# Patient Record
Sex: Female | Born: 1935 | Race: White | Hispanic: No | Marital: Married | Smoking: Former smoker
Health system: Southern US, Community
[De-identification: ages and names within clinical notes are randomized; demographics above are authoritative.]

## PROBLEM LIST (undated history)

## (undated) DIAGNOSIS — L570 Actinic keratosis: Secondary | ICD-10-CM

## (undated) DIAGNOSIS — N183 Chronic kidney disease, stage 3 unspecified: Secondary | ICD-10-CM

## (undated) DIAGNOSIS — I10 Essential (primary) hypertension: Secondary | ICD-10-CM

## (undated) DIAGNOSIS — I5032 Chronic diastolic (congestive) heart failure: Secondary | ICD-10-CM

## (undated) DIAGNOSIS — I4891 Unspecified atrial fibrillation: Secondary | ICD-10-CM

## (undated) DIAGNOSIS — S32030A Wedge compression fracture of third lumbar vertebra, initial encounter for closed fracture: Secondary | ICD-10-CM

## (undated) DIAGNOSIS — M1990 Unspecified osteoarthritis, unspecified site: Secondary | ICD-10-CM

## (undated) DIAGNOSIS — R1310 Dysphagia, unspecified: Secondary | ICD-10-CM

## (undated) DIAGNOSIS — I480 Paroxysmal atrial fibrillation: Secondary | ICD-10-CM

## (undated) DIAGNOSIS — I4719 Other supraventricular tachycardia: Secondary | ICD-10-CM

## (undated) DIAGNOSIS — K449 Diaphragmatic hernia without obstruction or gangrene: Secondary | ICD-10-CM

## (undated) DIAGNOSIS — K219 Gastro-esophageal reflux disease without esophagitis: Secondary | ICD-10-CM

## (undated) DIAGNOSIS — G4700 Insomnia, unspecified: Secondary | ICD-10-CM

## (undated) DIAGNOSIS — E039 Hypothyroidism, unspecified: Secondary | ICD-10-CM

## (undated) DIAGNOSIS — M8580 Other specified disorders of bone density and structure, unspecified site: Secondary | ICD-10-CM

## (undated) DIAGNOSIS — C50919 Malignant neoplasm of unspecified site of unspecified female breast: Secondary | ICD-10-CM

## (undated) DIAGNOSIS — I471 Supraventricular tachycardia: Secondary | ICD-10-CM

## (undated) DIAGNOSIS — J90 Pleural effusion, not elsewhere classified: Secondary | ICD-10-CM

## (undated) HISTORY — DX: Other supraventricular tachycardia: I47.19

## (undated) HISTORY — DX: Other specified disorders of bone density and structure, unspecified site: M85.80

## (undated) HISTORY — PX: TONSILLECTOMY: SUR1361

## (undated) HISTORY — DX: Essential (primary) hypertension: I10

## (undated) HISTORY — DX: Pleural effusion, not elsewhere classified: J90

## (undated) HISTORY — DX: Insomnia, unspecified: G47.00

## (undated) HISTORY — PX: EYE SURGERY: SHX253

## (undated) HISTORY — DX: Paroxysmal atrial fibrillation: I48.0

## (undated) HISTORY — DX: Unspecified atrial fibrillation: I48.91

## (undated) HISTORY — DX: Hypothyroidism, unspecified: E03.9

## (undated) HISTORY — DX: Chronic kidney disease, stage 3 unspecified: N18.30

## (undated) HISTORY — DX: Malignant neoplasm of unspecified site of unspecified female breast: C50.919

## (undated) HISTORY — DX: Dysphagia, unspecified: R13.10

## (undated) HISTORY — DX: Actinic keratosis: L57.0

## (undated) HISTORY — DX: Chronic diastolic (congestive) heart failure: I50.32

## (undated) HISTORY — DX: Diaphragmatic hernia without obstruction or gangrene: K44.9

## (undated) HISTORY — PX: COLONOSCOPY: SHX174

## (undated) HISTORY — DX: Supraventricular tachycardia: I47.1

---

## 1993-06-05 HISTORY — PX: BREAST LUMPECTOMY: SHX2

## 1997-09-28 ENCOUNTER — Other Ambulatory Visit: Admission: RE | Admit: 1997-09-28 | Discharge: 1997-09-28 | Payer: Self-pay | Admitting: Oncology

## 1998-02-10 ENCOUNTER — Other Ambulatory Visit: Admission: RE | Admit: 1998-02-10 | Discharge: 1998-02-10 | Payer: Self-pay | Admitting: *Deleted

## 1999-01-25 ENCOUNTER — Other Ambulatory Visit: Admission: RE | Admit: 1999-01-25 | Discharge: 1999-01-25 | Payer: Self-pay | Admitting: *Deleted

## 1999-12-06 ENCOUNTER — Other Ambulatory Visit: Admission: RE | Admit: 1999-12-06 | Discharge: 1999-12-06 | Payer: Self-pay | Admitting: Family Medicine

## 2000-02-03 ENCOUNTER — Encounter: Payer: Self-pay | Admitting: General Surgery

## 2000-02-03 ENCOUNTER — Encounter: Admission: RE | Admit: 2000-02-03 | Discharge: 2000-02-03 | Payer: Self-pay | Admitting: General Surgery

## 2001-02-12 ENCOUNTER — Encounter: Admission: RE | Admit: 2001-02-12 | Discharge: 2001-02-12 | Payer: Self-pay | Admitting: General Surgery

## 2001-02-12 ENCOUNTER — Encounter: Payer: Self-pay | Admitting: General Surgery

## 2002-01-27 ENCOUNTER — Other Ambulatory Visit: Admission: RE | Admit: 2002-01-27 | Discharge: 2002-01-27 | Payer: Self-pay | Admitting: *Deleted

## 2002-02-18 ENCOUNTER — Encounter: Admission: RE | Admit: 2002-02-18 | Discharge: 2002-02-18 | Payer: Self-pay | Admitting: General Surgery

## 2002-02-18 ENCOUNTER — Encounter: Payer: Self-pay | Admitting: General Surgery

## 2002-04-07 ENCOUNTER — Encounter: Payer: Self-pay | Admitting: Oncology

## 2002-04-07 ENCOUNTER — Encounter: Admission: RE | Admit: 2002-04-07 | Discharge: 2002-04-07 | Payer: Self-pay | Admitting: Oncology

## 2003-02-03 ENCOUNTER — Other Ambulatory Visit: Admission: RE | Admit: 2003-02-03 | Discharge: 2003-02-03 | Payer: Self-pay | Admitting: *Deleted

## 2003-02-24 ENCOUNTER — Encounter: Admission: RE | Admit: 2003-02-24 | Discharge: 2003-02-24 | Payer: Self-pay | Admitting: General Surgery

## 2003-02-24 ENCOUNTER — Encounter: Payer: Self-pay | Admitting: General Surgery

## 2003-03-25 ENCOUNTER — Encounter: Admission: RE | Admit: 2003-03-25 | Discharge: 2003-03-25 | Payer: Self-pay | Admitting: Oncology

## 2003-03-25 ENCOUNTER — Encounter: Payer: Self-pay | Admitting: Oncology

## 2004-02-25 ENCOUNTER — Encounter: Admission: RE | Admit: 2004-02-25 | Discharge: 2004-02-25 | Payer: Self-pay | Admitting: General Surgery

## 2004-04-18 ENCOUNTER — Ambulatory Visit: Payer: Self-pay | Admitting: Oncology

## 2004-07-05 ENCOUNTER — Ambulatory Visit: Payer: Self-pay | Admitting: Internal Medicine

## 2004-08-02 ENCOUNTER — Ambulatory Visit: Payer: Self-pay | Admitting: Internal Medicine

## 2005-02-27 ENCOUNTER — Encounter: Admission: RE | Admit: 2005-02-27 | Discharge: 2005-02-27 | Payer: Self-pay | Admitting: General Surgery

## 2006-02-28 ENCOUNTER — Encounter: Admission: RE | Admit: 2006-02-28 | Discharge: 2006-02-28 | Payer: Self-pay | Admitting: General Surgery

## 2007-03-20 ENCOUNTER — Encounter: Admission: RE | Admit: 2007-03-20 | Discharge: 2007-03-20 | Payer: Self-pay | Admitting: Obstetrics and Gynecology

## 2008-03-26 ENCOUNTER — Encounter: Admission: RE | Admit: 2008-03-26 | Discharge: 2008-03-26 | Payer: Self-pay | Admitting: Family Medicine

## 2008-12-09 IMAGING — CR DG CHEST 2V
2 series · 2 of 2 positions shown · non-contrast
Comparison: None.

CLINICAL DATA: Preop for surgery for Dupuytrens contracture

CHEST - 2 VIEW

[view not recorded (1 of 2)]
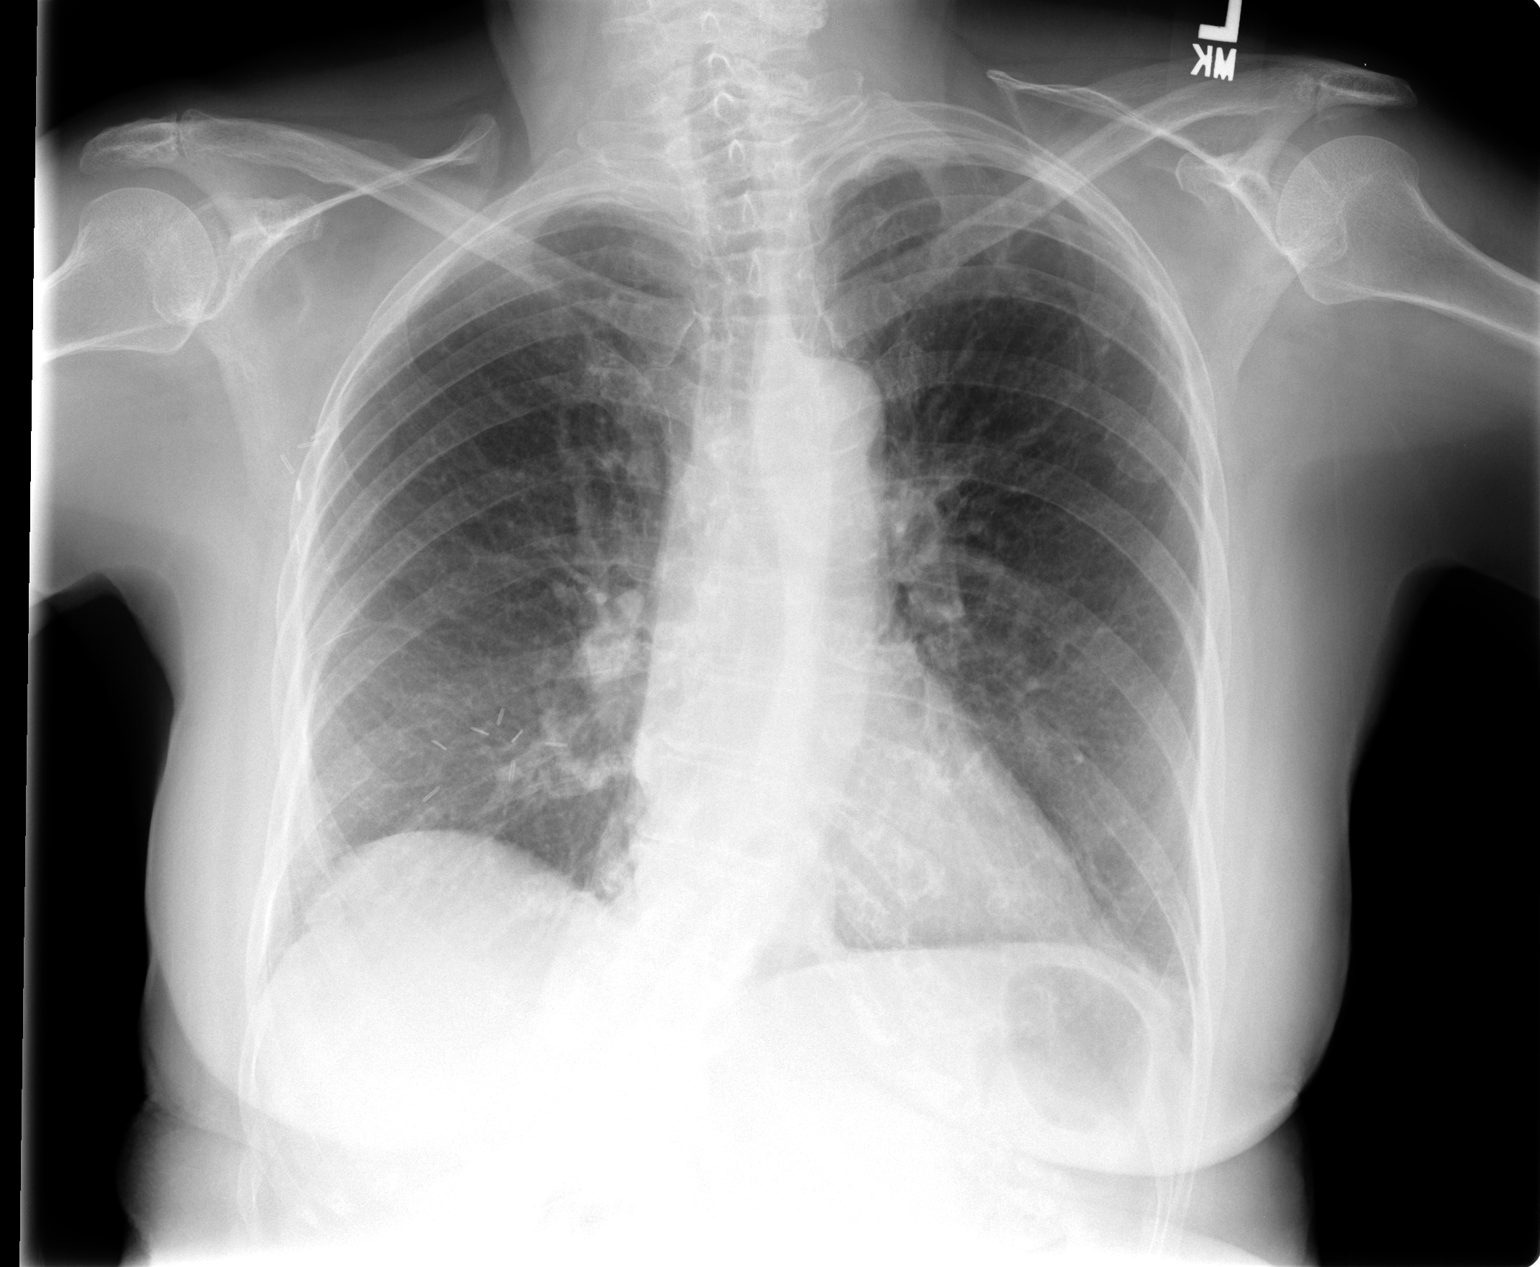

[view not recorded (2 of 2)]
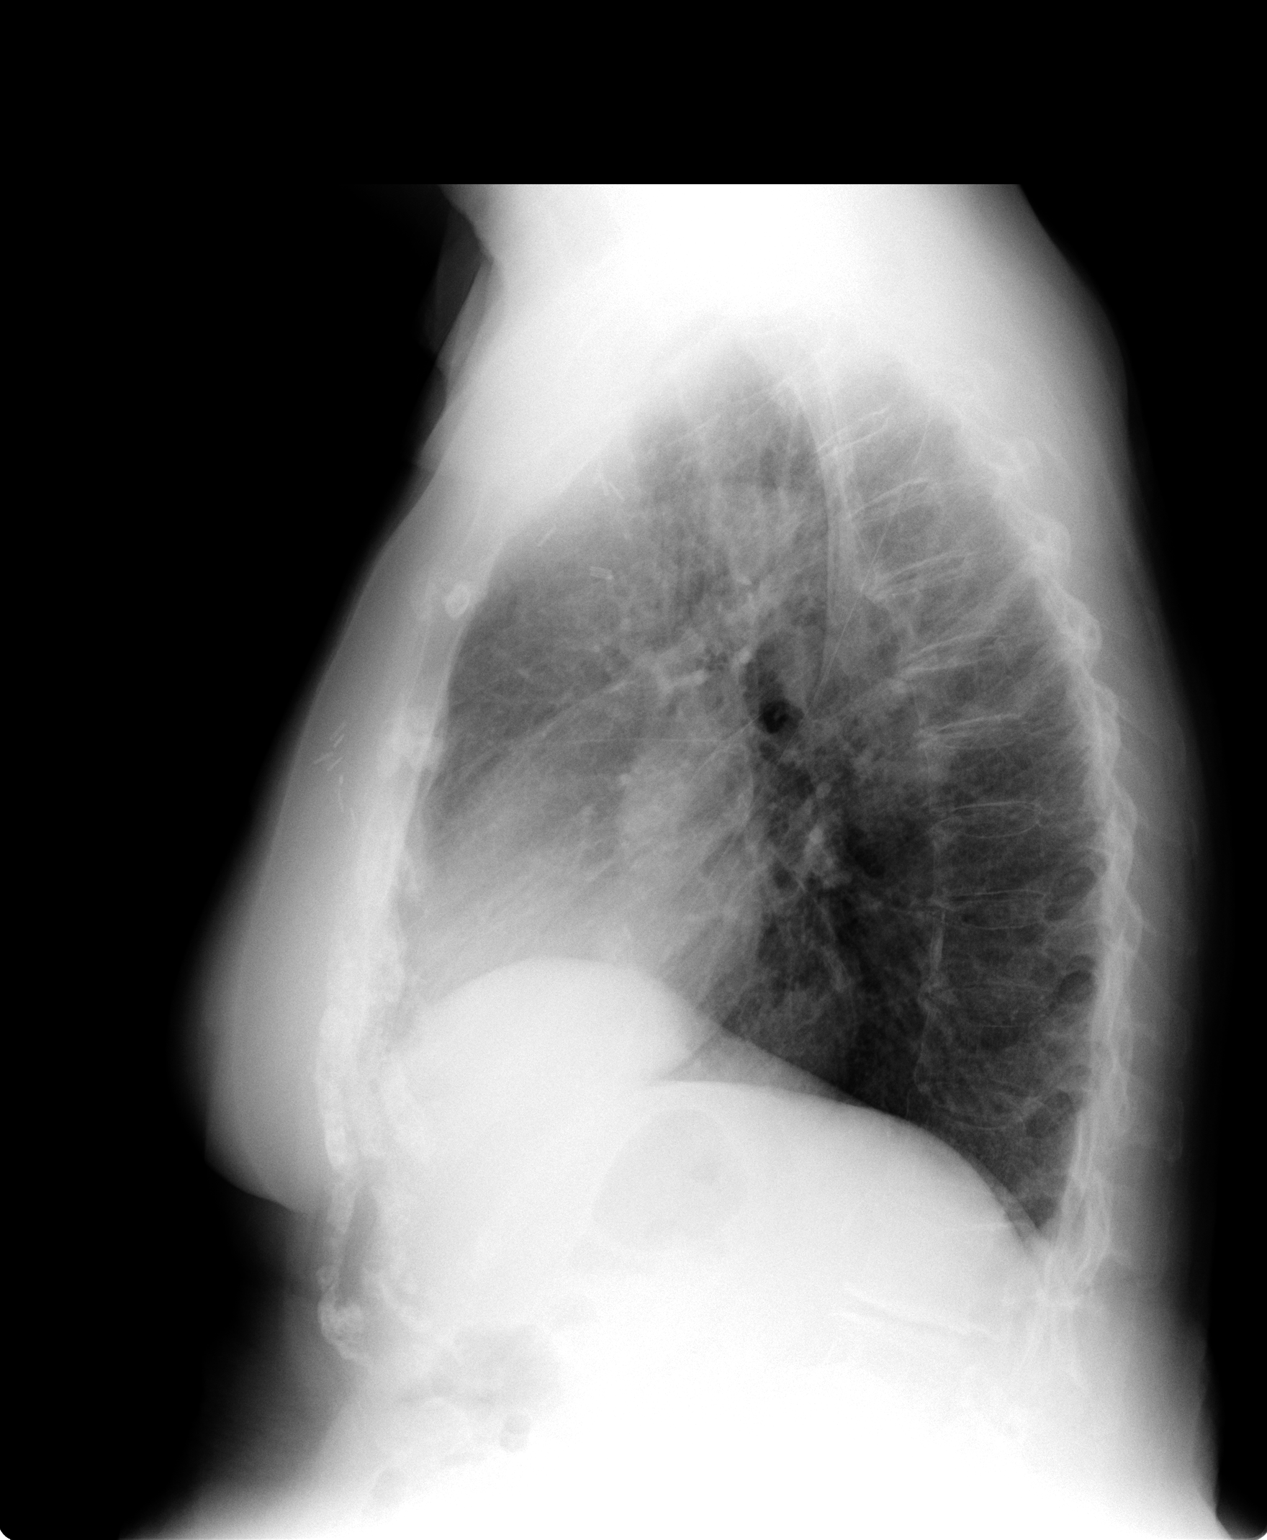

[2 of 2 positions shown; findings below may reference images not displayed]

FINDINGS: The lungs are clear.  No mediastinal or hilar adenopathy
is seen.  The heart is mildly enlarged.  Thoracolumbar scoliosis is
noted.  Surgical clips overlie the right breast and right axilla.
IMPRESSION: No active lung disease.

## 2009-03-29 ENCOUNTER — Encounter: Admission: RE | Admit: 2009-03-29 | Discharge: 2009-03-29 | Payer: Self-pay | Admitting: Family Medicine

## 2009-08-08 ENCOUNTER — Emergency Department (HOSPITAL_COMMUNITY): Admission: EM | Admit: 2009-08-08 | Discharge: 2009-08-09 | Payer: Self-pay | Admitting: Emergency Medicine

## 2009-08-08 IMAGING — CT CT HEAD W/O CM
1 series · 16 of 30 positions shown, 20 images · non-contrast
Comparison: None.

CLINICAL DATA: Headache, nausea, and vomiting.  Hypertension.

CT HEAD WITHOUT CONTRAST
TECHNIQUE: Contiguous axial images were obtained from the base of
the skull through the vertex without contrast.

[Series 2: headseq 4.8 h45s · axial · 0.43mm/px · z∈[-58,+70]mm · 16 of 30 slices shown, 20 images]
[im 2/30  brain]
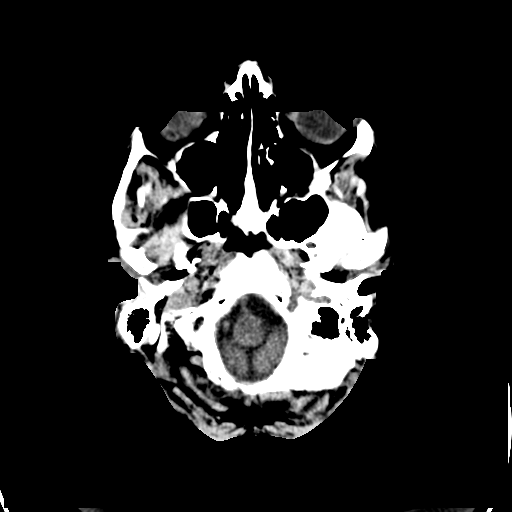
[im 2/30  bone]
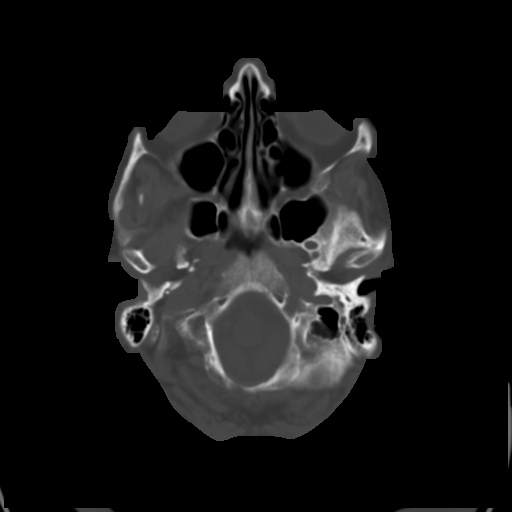
[im 4/30  brain]
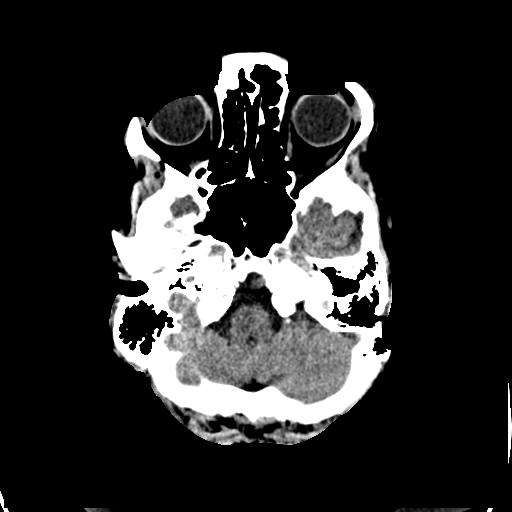
[im 6/30  brain]
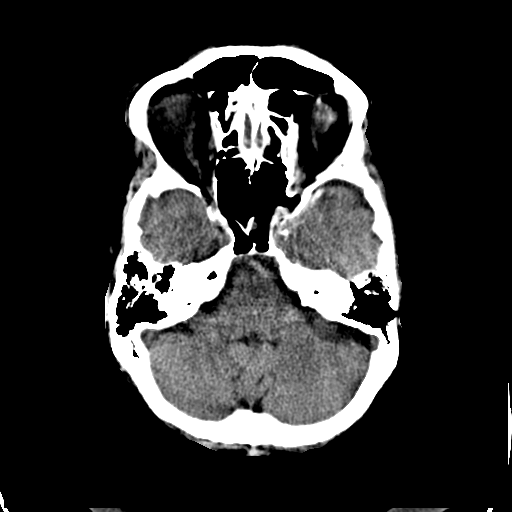
[im 8/30  brain]
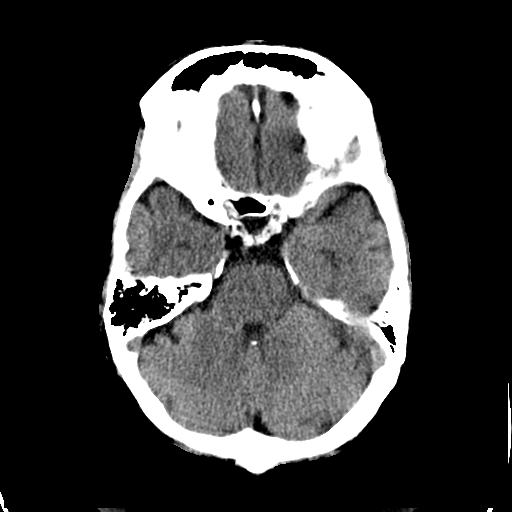
[im 9/30  brain]
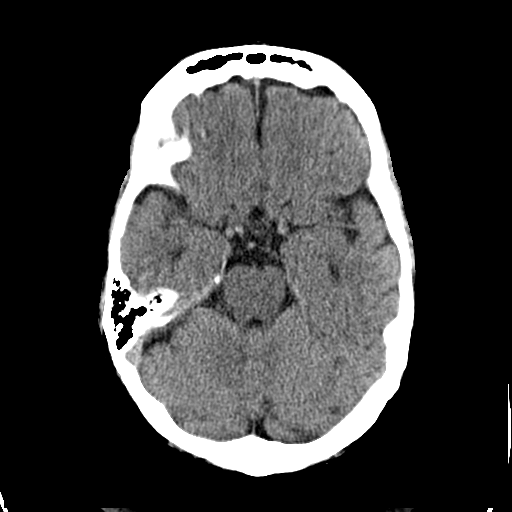
[im 9/30  bone]
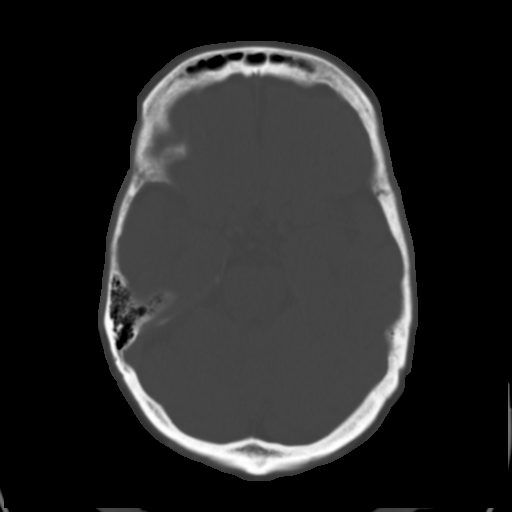
[im 11/30  brain]
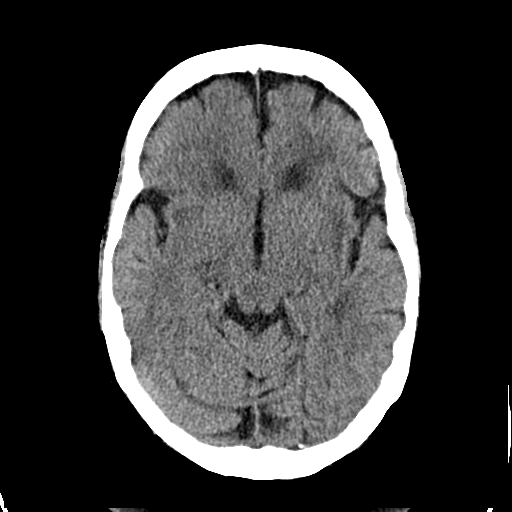
[im 13/30  brain]
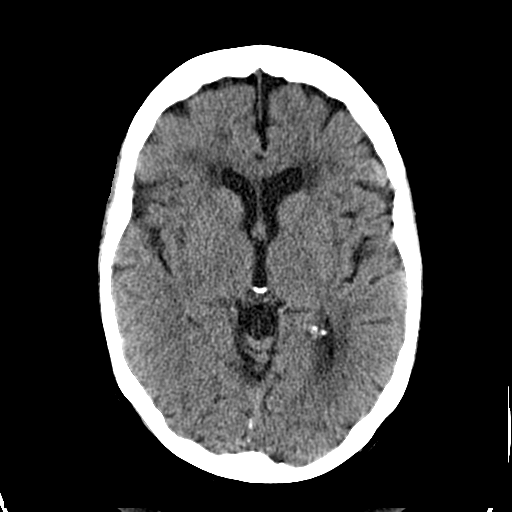
[im 15/30  brain]
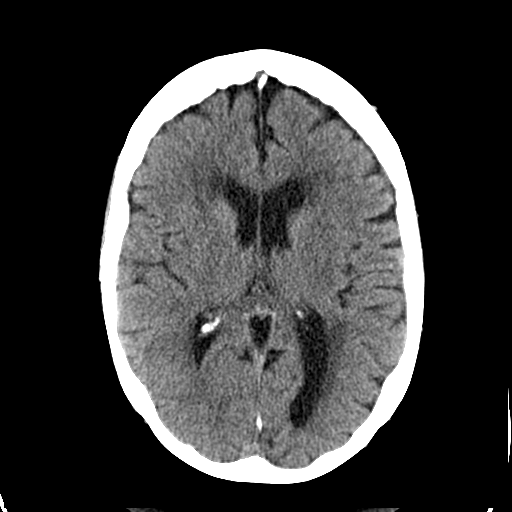
[im 16/30  brain]
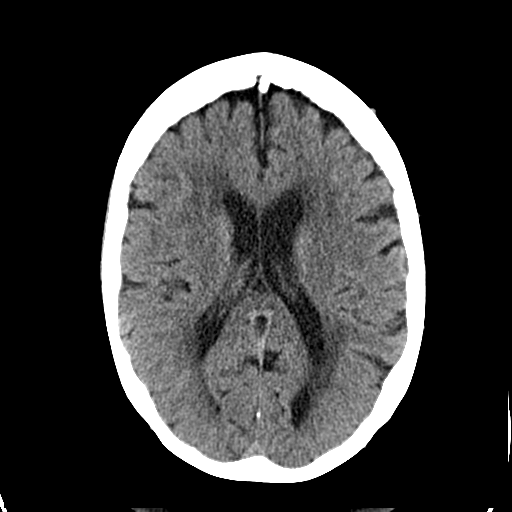
[im 16/30  bone]
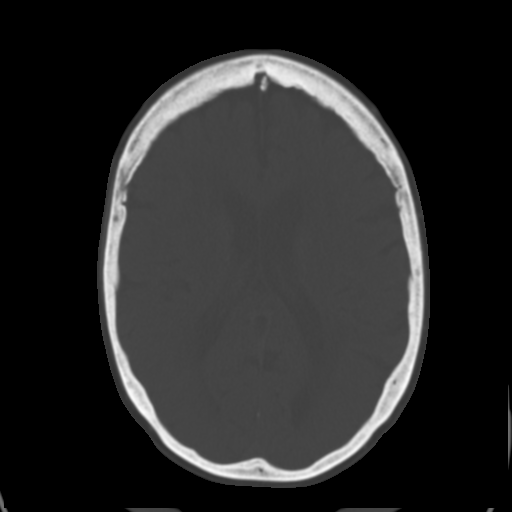
[im 18/30  brain]
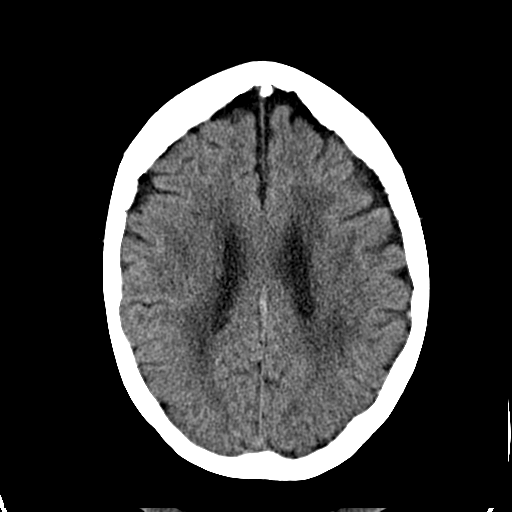
[im 20/30  brain]
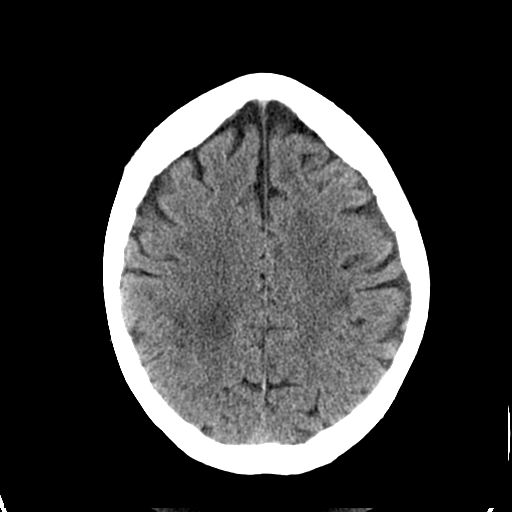
[im 22/30  brain]
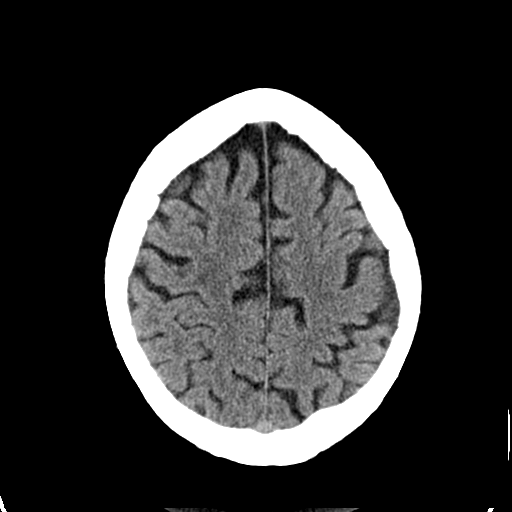
[im 23/30  brain]
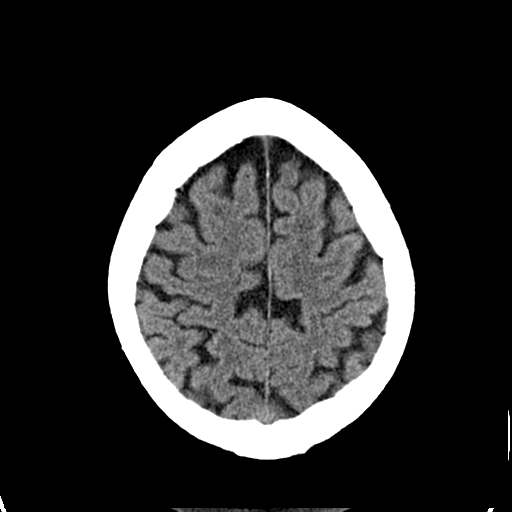
[im 23/30  bone]
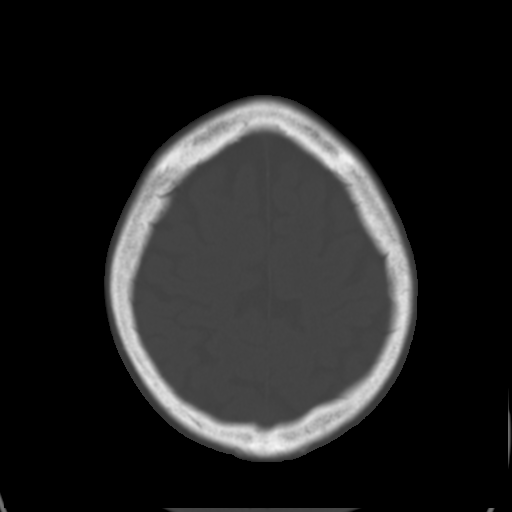
[im 25/30  brain]
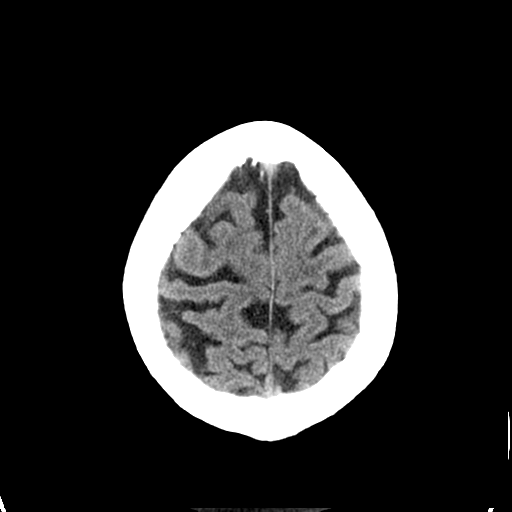
[im 27/30  brain]
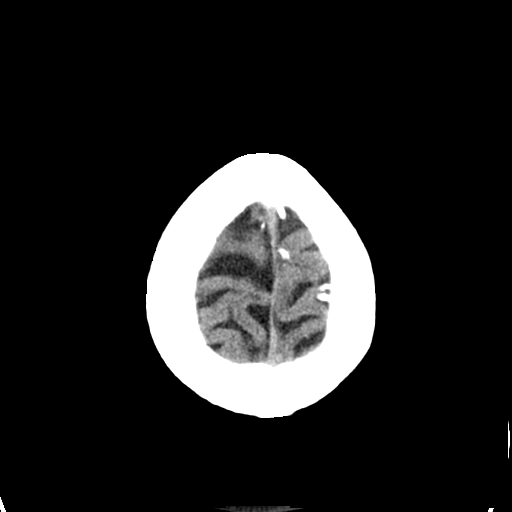
[im 29/30  brain]
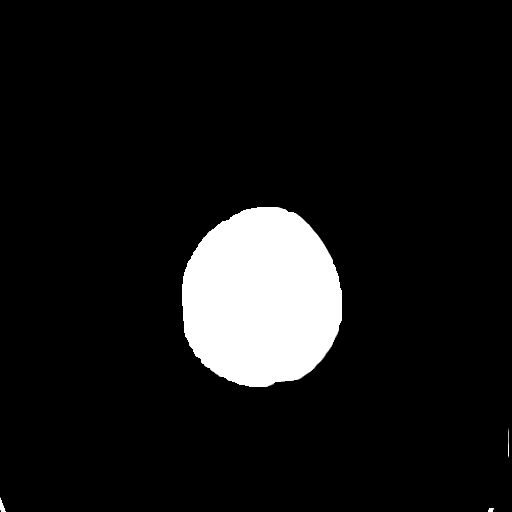

[16 of 30 positions shown; findings below may reference images not displayed]

FINDINGS: There is no acute intracranial hemorrhage or mass lesion.
There are extensive periventricular white matter lucencies
consistent with small vessel ischemic disease, chronic.  No
definitive acute infarction.  There is mild cerebral cortical
atrophy with no significant ventricular dilatation.  Bony
structures are normal.
IMPRESSION: No acute intracranial abnormality.  Extensive periventricular white
matter small vessel ischemic changes.

## 2009-08-08 IMAGING — RF DG FLUORO GUIDE NDL PLC/BX
1 series · 1 of 1 positions shown · non-contrast
Comparison: none

CLINICAL DATA: Headache.  Nausea.  Subarachnoid hemorrhage.

DIAGNOSTIC LUMBAR PUNCTURE UNDER FLUOROSCOPIC GUIDANCE
Fluoroscopy time:  0.6 minutes. Pulsed fluoroscopy was used.
TECHNIQUE: Informed consent was obtained from the patient prior to
the procedure, including potential complications of headache,
allergy, and pain.   With the patient prone, the lower back was
prepped with Betadine.  1% Lidocaine was used for local anesthesia.
Lumbar puncture was performed at the L3-L4 level using a 20 gauge
needle with return of clear CSF with an opening pressure of 16 cm
water.   8 ml of CSF were obtained for laboratory studies.  The
patient tolerated the procedure well and there were no apparent
complications.

[Series 1: run · 1 of 1 slices shown]
[im 1/1]
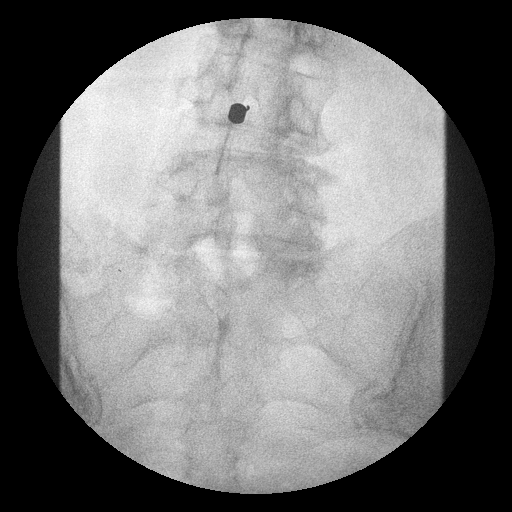

[1 of 1 positions shown; findings below may reference images not displayed]

IMPRESSION: Successful fluoroscopically guided lumbar puncture.

## 2009-12-16 ENCOUNTER — Encounter: Admission: RE | Admit: 2009-12-16 | Discharge: 2009-12-16 | Payer: Self-pay | Admitting: Orthopedic Surgery

## 2009-12-21 ENCOUNTER — Ambulatory Visit (HOSPITAL_BASED_OUTPATIENT_CLINIC_OR_DEPARTMENT_OTHER): Admission: RE | Admit: 2009-12-21 | Discharge: 2009-12-21 | Payer: Self-pay | Admitting: Orthopedic Surgery

## 2010-03-10 ENCOUNTER — Encounter
Admission: RE | Admit: 2010-03-10 | Discharge: 2010-05-06 | Payer: Self-pay | Source: Home / Self Care | Admitting: Sports Medicine

## 2010-04-19 ENCOUNTER — Encounter: Admission: RE | Admit: 2010-04-19 | Discharge: 2010-04-19 | Payer: Self-pay | Admitting: Sports Medicine

## 2010-06-25 ENCOUNTER — Encounter: Payer: Self-pay | Admitting: Family Medicine

## 2010-08-21 LAB — BASIC METABOLIC PANEL
BUN: 18 mg/dL (ref 6–23)
Calcium: 9.4 mg/dL (ref 8.4–10.5)
GFR calc non Af Amer: >60 mL/min (ref >60)

## 2010-08-29 LAB — COMPREHENSIVE METABOLIC PANEL
AST: 23 U/L (ref 0–37)
Albumin: 3.5 g/dL (ref 3.5–5.2)
Alkaline Phosphatase: 55 U/L (ref 39–117)
GFR calc Af Amer: >60 mL/min (ref >60)
GFR calc non Af Amer: >60 mL/min (ref >60)
Potassium: 3.9 mEq/L (ref 3.5–5.1)
Total Protein: 6.4 g/dL (ref 6.0–8.3)

## 2010-08-29 LAB — DIFFERENTIAL
Basophils Absolute: 0.3 10*3/uL — ABNORMAL HIGH (ref 0.0–0.1)
Basophils Relative: 3 % — ABNORMAL HIGH (ref 0–1)
Lymphocytes Relative: 17 % (ref 12–46)
Monocytes Absolute: 0.5 10*3/uL (ref 0.1–1.0)
Monocytes Relative: 6 % (ref 3–12)
Neutro Abs: 7.2 10*3/uL (ref 1.7–7.7)
Neutrophils Relative %: 72 % (ref 43–77)

## 2010-08-29 LAB — CSF CELL COUNT WITH DIFFERENTIAL
RBC Count, CSF: 57 /mm3 — ABNORMAL HIGH
Tube #: 4

## 2010-08-29 LAB — CSF CULTURE W GRAM STAIN: Gram Stain: NONE SEEN

## 2010-08-29 LAB — CBC
HCT: 41.4 % (ref 36.0–46.0)
Hemoglobin: 14.4 g/dL (ref 12.0–15.0)
Platelets: 191 10*3/uL (ref 150–400)
RBC: 4.42 MIL/uL (ref 3.87–5.11)
RDW: 12.6 % (ref 11.5–15.5)
WBC: 9.9 10*3/uL (ref 4.0–10.5)

## 2010-08-29 LAB — GLUCOSE, CSF: Glucose, CSF: 61 mg/dL (ref 43–76)

## 2010-08-29 LAB — PROTEIN, CSF: Total  Protein, CSF: 38 mg/dL (ref 15–45)

## 2010-09-07 ENCOUNTER — Other Ambulatory Visit: Payer: Self-pay | Admitting: Dermatology

## 2011-03-08 ENCOUNTER — Other Ambulatory Visit: Payer: Self-pay | Admitting: Dermatology

## 2011-03-15 ENCOUNTER — Other Ambulatory Visit: Payer: Self-pay | Admitting: Obstetrics and Gynecology

## 2011-03-15 DIAGNOSIS — Z1231 Encounter for screening mammogram for malignant neoplasm of breast: Secondary | ICD-10-CM

## 2011-04-24 ENCOUNTER — Ambulatory Visit: Payer: Self-pay

## 2011-04-26 ENCOUNTER — Ambulatory Visit
Admission: RE | Admit: 2011-04-26 | Discharge: 2011-04-26 | Disposition: A | Discharge status: Home / Self Care | Payer: Medicare Other | Source: Ambulatory Visit | Attending: Obstetrics and Gynecology | Admitting: Obstetrics and Gynecology

## 2011-04-26 DIAGNOSIS — Z1231 Encounter for screening mammogram for malignant neoplasm of breast: Secondary | ICD-10-CM

## 2012-03-21 ENCOUNTER — Other Ambulatory Visit: Payer: Self-pay | Admitting: Obstetrics and Gynecology

## 2012-03-21 DIAGNOSIS — Z1231 Encounter for screening mammogram for malignant neoplasm of breast: Secondary | ICD-10-CM

## 2012-04-29 ENCOUNTER — Ambulatory Visit
Admission: RE | Admit: 2012-04-29 | Discharge: 2012-04-29 | Disposition: A | Discharge status: Home / Self Care | Payer: Self-pay | Source: Ambulatory Visit | Attending: Obstetrics and Gynecology | Admitting: Obstetrics and Gynecology

## 2012-04-29 DIAGNOSIS — Z1231 Encounter for screening mammogram for malignant neoplasm of breast: Secondary | ICD-10-CM

## 2013-04-04 ENCOUNTER — Other Ambulatory Visit: Payer: Self-pay

## 2013-04-04 DIAGNOSIS — Z1231 Encounter for screening mammogram for malignant neoplasm of breast: Secondary | ICD-10-CM

## 2013-04-10 ENCOUNTER — Ambulatory Visit: Payer: Self-pay | Admitting: Cardiology

## 2013-04-24 ENCOUNTER — Ambulatory Visit: Payer: Self-pay | Admitting: Cardiology

## 2013-05-05 ENCOUNTER — Ambulatory Visit
Admission: RE | Admit: 2013-05-05 | Discharge: 2013-05-05 | Disposition: A | Discharge status: Home / Self Care | Payer: Medicare Other | Source: Ambulatory Visit

## 2013-05-05 DIAGNOSIS — Z1231 Encounter for screening mammogram for malignant neoplasm of breast: Secondary | ICD-10-CM

## 2013-05-07 ENCOUNTER — Telehealth: Payer: Self-pay | Admitting: Cardiology

## 2013-05-07 NOTE — Telephone Encounter (Signed)
Follow Up   Pt was recently in the hospital in [facility]. Pt has an appointment 12/5// Pt's Husband states that there were a lot of test completed and asks if the records are needed for the up coming appointment//Husband states her PCP has received these notes.. Please call back to advise if notes are already on file.

## 2013-05-08 ENCOUNTER — Encounter: Payer: Self-pay | Admitting: Cardiology

## 2013-05-08 DIAGNOSIS — I10 Essential (primary) hypertension: Secondary | ICD-10-CM | POA: Insufficient documentation

## 2013-05-08 DIAGNOSIS — E039 Hypothyroidism, unspecified: Secondary | ICD-10-CM | POA: Insufficient documentation

## 2013-05-09 ENCOUNTER — Ambulatory Visit (INDEPENDENT_AMBULATORY_CARE_PROVIDER_SITE_OTHER): Payer: Medicare Other | Admitting: Cardiology

## 2013-05-09 VITALS — BP 151/85 | HR 71 | Ht 64.5 in | Wt 162.8 lb

## 2013-05-09 DIAGNOSIS — M549 Dorsalgia, unspecified: Secondary | ICD-10-CM

## 2013-05-09 DIAGNOSIS — I10 Essential (primary) hypertension: Secondary | ICD-10-CM

## 2013-05-09 DIAGNOSIS — I4891 Unspecified atrial fibrillation: Secondary | ICD-10-CM | POA: Insufficient documentation

## 2013-05-09 NOTE — Patient Instructions (Addendum)
Your physician has recommended that you wear a 30 day event monitor. Event monitors are medical devices that record the heart's electrical activity. Doctors most often [place] these monitors to diagnose arrhythmias. Arrhythmias are problems with the speed or rhythm of the heartbeat. The monitor is a small, portable device. You can wear one while you do your normal daily activities. This is usually used to diagnose what is causing palpitations/syncope (passing out).  Your physician recommends that you schedule a follow-up appointment in: 3 months with Dr.Skains  Your physician recommends that you continue on your current medications as directed. Please refer to the Current Medication list given to you today.

## 2013-05-09 NOTE — Progress Notes (Signed)
[facility]. [address], [address], [place]  [address] Phone: [phone] Fax:  [phone]  Date:  05/09/2013   ID:  Cassandra Ray, DOB 1922/12/17, MRN [id number]  PCP:   Melinda Crutch, MD   History of Present Illness: Cassandra Ray is a 79 y.o. female with dyspnea, grade 3 diastolic dysfunction, normal ejection fraction, hypertension here for followup. In November of 2014 was hospitalized in [place]. Blood pressure medication was increased. Hyponatremia has been chronic. She underwent a stress test which showed no evidence of ischemia. Low risk. Normal EF. Echocardiogram showed EF of 55-60% with mild LVH, mild pulmonary hypertension, trace TR. I reviewed her discharge summary, she was visiting [place] and felt weak, vomiting, nausea. This began after eating seafood. She was diagnosed with A. fib and RVR. IV Lopressor. As well as fluids. She converted to normal rhythm. Abdominal ultrasound showed an incidental finding of pancreatic cyst with recommendations to repeat in 6-12 months. Her Toprol regimen was continued however lisinopril 10 mg was substituted for Norvasc. I have reviewed emergency room stress and it is challenging for me to state clearly that she had atrial fibrillation. There do appear to be P waves present. Difficult to determine.  Wt Readings from Last 3 Encounters:  05/09/13 162 lb 12.8 oz (73.846 kg)     Past Medical History  Diagnosis Date  . HTN (hypertension)   . Hypothyroidism   . Insomnia   . Actinic keratoses     Past Surgical History  Procedure Laterality Date  . Breast lumpectomy      Current Outpatient Prescriptions  Medication Sig Dispense Refill  . albuterol (PROVENTIL HFA;VENTOLIN HFA) 108 (90 BASE) MCG/ACT inhaler Inhale 2 puffs into the lungs every 6 (six) hours as needed for wheezing or shortness of breath.      . Alum & Mag Hydroxide-Simeth (MAG-AL PLUS PO) Take by mouth.      . calcium citrate-vitamin D (CITRACAL+D)  315-200 MG-UNIT per tablet Take 1 tablet by mouth daily.      . cholecalciferol (VITAMIN D) 400 UNITS TABS tablet Take 400 Units by mouth daily.      . famotidine-calcium carbonate-magnesium hydroxide (PEPCID COMPLETE) 10-800-165 MG CHEW chewable tablet Chew 1 tablet by mouth daily as needed.      Marland Kitchen levothyroxine (SYNTHROID, LEVOTHROID) 125 MCG tablet Take 125 mcg by mouth daily before breakfast.      . lisinopril (PRINIVIL,ZESTRIL) 10 MG tablet 10 mg daily.      . metoprolol (LOPRESSOR) 100 MG tablet Take 100 mg by mouth 2 (two) times daily.      . mirabegron ER (MYRBETRIQ) 50 MG TB24 tablet Take 50 mg by mouth daily.      . Multiple Vitamin (MULTIVITAMIN) tablet Take 1 tablet by mouth daily.      Marland Kitchen zolpidem (AMBIEN) 10 MG tablet Take 10 mg by mouth at bedtime as needed for sleep.      Marland Kitchen aspirin 81 MG tablet Take 81 mg by mouth daily.       No current facility-administered medications for this visit.    Allergies:   No Known Allergies  Social History:  The patient     ROS:  Please see the history of present illness.   No syncope, no fevers. Positive back pain. No chest pain.    PHYSICAL EXAM: VS:  BP 151/85  Pulse 71  Ht 5' 4.5" (1.638 m)  Wt 162 lb 12.8 oz (73.846 kg)  BMI 27.52 kg/m2 Well nourished, well developed, in no acute distress HEENT: normal Neck: no JVD Cardiac:  normal S1, S2; RRR; no murmur Lungs:  clear to auscultation bilaterally, no wheezing, rhonchi or rales Abd: soft, nontender, no hepatomegaly Ext: no edema Skin: warm and dry Neuro: no focal abnormalities noted  EKG:  Prior EKGs from hospitalization reviewed. P waves are present however challenging to make a definitive diagnosis of atrial fibrillation. Intervals appear quite normal. Could be PAT?   Echo: 4/14: 1. There is moderate asymmetric left ventricle hypertrophy. 2. Left ventricular ejection fraction estimated by 2D at 55-60 percent. 3. There were no regional wall motion abnormalities. 4. There is  mild tricuspid regurgitation. 5. Mildly elevated estimated right ventricular systolic pressure. 6. Mild calcification of the aortic valve. 7. Analysis of mitral valve inflow, pulmonary vein Doppler and tissue Doppler suggests grade III diastolic dysfunction with elevated left atrial pressures.   ASSESSMENT AND PLAN:  1. Possible atrial fibrillation paroxysmal-I would like for her to wear an event monitor for one month to see if we detect any evidence of a chew fibrillation. She states that she did not realize that she was in atrial fibrillation when she went to the emergency room. They did not start her on anticoagulation in the hospital which may lead me to believe that there could of been question in the diagnosis of atrial fibrillation as well or perhaps it was not started because of short episode. Stress test, echocardiogram reassuring. I had lengthy discussion with her on the risks of atrial fibrillation and stroke. She is aware. 2. Hypertension-mildly elevated today. Continue with current blood pressure changes. Agree with metoprolol as well as ACE inhibitor given her current condition. 3. Back pain-she is currently holding aspirin. She is going to have spinal injections soon. Another reason for not starting anticoagulation at this point. 4. We will see back in 3 months.  Signed, Candee Furbish, MD [facility]  05/09/2013 4:06 PM

## 2013-05-10 ENCOUNTER — Encounter: Payer: Self-pay | Admitting: Cardiology

## 2013-05-12 NOTE — Telephone Encounter (Signed)
Patient seen on 05/09/2013. Closing note.Marland KitchenMarland Kitchen

## 2013-05-15 ENCOUNTER — Encounter (INDEPENDENT_AMBULATORY_CARE_PROVIDER_SITE_OTHER): Payer: Medicare Other

## 2013-05-15 ENCOUNTER — Encounter: Payer: Self-pay | Admitting: *Deleted

## 2013-05-15 DIAGNOSIS — I4891 Unspecified atrial fibrillation: Secondary | ICD-10-CM

## 2013-05-15 NOTE — Progress Notes (Signed)
Patient ID: Cassandra Ray, female   DOB: 1922-12-07, 79 y.o.   MRN: [id number] [employer] 30 day cardiac event monitor applied to patient.

## 2013-07-14 ENCOUNTER — Telehealth: Payer: Self-pay | Admitting: Cardiology

## 2013-07-14 NOTE — Telephone Encounter (Signed)
Monitor Results

## 2013-08-11 ENCOUNTER — Ambulatory Visit (INDEPENDENT_AMBULATORY_CARE_PROVIDER_SITE_OTHER): Payer: Medicare Other | Admitting: Cardiology

## 2013-08-11 ENCOUNTER — Encounter: Payer: Self-pay | Admitting: Cardiology

## 2013-08-11 VITALS — BP 120/82 | HR 64 | Ht 64.0 in | Wt 157.0 lb

## 2013-08-11 DIAGNOSIS — I10 Essential (primary) hypertension: Secondary | ICD-10-CM

## 2013-08-11 DIAGNOSIS — R609 Edema, unspecified: Secondary | ICD-10-CM

## 2013-08-11 DIAGNOSIS — M25569 Pain in unspecified knee: Secondary | ICD-10-CM

## 2013-08-11 DIAGNOSIS — R600 Localized edema: Secondary | ICD-10-CM

## 2013-08-11 DIAGNOSIS — I4891 Unspecified atrial fibrillation: Secondary | ICD-10-CM

## 2013-08-11 NOTE — Progress Notes (Signed)
[facility]. [address], [place]  [address] Phone: [phone] Fax:  [phone]  Date:  08/11/2013   ID:  Cassandra Ray, DOB 1922/12/03, MRN [id number]  PCP:   Melinda Crutch, MD   History of Present Illness: Cassandra Ray is a 80 y.o. female with dyspnea, grade 3 diastolic dysfunction, normal ejection fraction, hypertension here for followup. Right knee pain. In November of 2014 was hospitalized in [place]. Blood pressure medication was increased. Hyponatremia has been chronic. She underwent a stress test which showed no evidence of ischemia. Low risk. Normal EF. Echocardiogram showed EF of 55-60% with mild LVH, mild pulmonary hypertension, trace TR. I reviewed her discharge summary, she was visiting [place] and felt weak, vomiting, nausea. This began after eating seafood. She was diagnosed with A. fib and RVR. IV Lopressor. As well as fluids. She converted to normal rhythm. Abdominal ultrasound showed an incidental finding of pancreatic cyst with recommendations to repeat in 6-12 months. Her Toprol regimen was continued however lisinopril 10 mg was substituted for Norvasc. I have reviewed emergency room stress and it is challenging for me to state clearly that she had atrial fibrillation. There do appear to be P waves present. Difficult to determine.  She underwent event monitor through December/January 2014/2015 which showed no evidence of atrial fibrillation. Excellent. She's had no further palpitations. Main complaint is knee pain. Minor lower extremity swelling.  Wt Readings from Last 3 Encounters:  08/11/13 157 lb (71.215 kg)  05/09/13 162 lb 12.8 oz (73.846 kg)     Past Medical History  Diagnosis Date  . HTN (hypertension)   . Hypothyroidism   . Insomnia   . Actinic keratoses     Past Surgical History  Procedure Laterality Date  . Breast lumpectomy      Current Outpatient Prescriptions  Medication Sig Dispense Refill  . albuterol  (PROVENTIL HFA;VENTOLIN HFA) 108 (90 BASE) MCG/ACT inhaler Inhale 2 puffs into the lungs every 6 (six) hours as needed for wheezing or shortness of breath.      . Alum & Mag Hydroxide-Simeth (MAG-AL PLUS PO) Take by mouth.      Marland Kitchen aspirin 81 MG tablet Take 81 mg by mouth daily.      . calcium citrate-vitamin D (CITRACAL+D) 315-200 MG-UNIT per tablet Take 1 tablet by mouth daily.      . cholecalciferol (VITAMIN D) 400 UNITS TABS tablet Take 400 Units by mouth daily.      . famotidine-calcium carbonate-magnesium hydroxide (PEPCID COMPLETE) 10-800-165 MG CHEW chewable tablet Chew 1 tablet by mouth daily as needed.      Marland Kitchen levothyroxine (SYNTHROID, LEVOTHROID) 125 MCG tablet Take 125 mcg by mouth daily before breakfast.      . lisinopril (PRINIVIL,ZESTRIL) 10 MG tablet 10 mg daily.      . metoprolol (LOPRESSOR) 100 MG tablet Take 100 mg by mouth 2 (two) times daily.      . mirabegron ER (MYRBETRIQ) 50 MG TB24 tablet Take 50 mg by mouth daily.      . Multiple Vitamin (MULTIVITAMIN) tablet Take 1 tablet by mouth daily.      Marland Kitchen zolpidem (AMBIEN) 10 MG tablet Take 10 mg by mouth at bedtime as needed for sleep.       No current facility-administered medications for this visit.    Allergies:   No Known Allergies  Social History:  The patient  reports that she has never smoked. She does not have any  smokeless tobacco history on file.   ROS:  Please see the history of present illness.   No syncope, no fevers. Positive back pain. No chest pain.    PHYSICAL EXAM: VS:  BP 120/82  Pulse 64  Ht 5\' 4"  (1.626 m)  Wt 157 lb (71.215 kg)  BMI 26.94 kg/m2 Well nourished, well developed, in no acute distress HEENT: normal Neck: no JVD Cardiac:  normal S1, S2; RRR; no murmur Lungs:  clear to auscultation bilaterally, no wheezing, rhonchi or rales Abd: soft, nontender, no hepatomegaly Ext: trace edema Skin: warm and dry Neuro: no focal abnormalities noted  EKG:  Prior EKGs from hospitalization reviewed. P  waves are present however challenging to make a definitive diagnosis of atrial fibrillation. Intervals appear quite normal. Could be PAT?   Echo: 4/14: 1. There is moderate asymmetric left ventricle hypertrophy. 2. Left ventricular ejection fraction estimated by 2D at 55-60 percent. 3. There were no regional wall motion abnormalities. 4. There is mild tricuspid regurgitation. 5. Mildly elevated estimated right ventricular systolic pressure. 6. Mild calcification of the aortic valve. 7. Analysis of mitral valve inflow, pulmonary vein Doppler and tissue Doppler suggests grade III diastolic dysfunction with elevated left atrial pressures.   ASSESSMENT AND PLAN:  1. Possible atrial fibrillation paroxysmal- Event monitor was reassuring. No evidence of atrial fibrillation over the month of December/January. She states that she did not realize that she was in atrial fibrillation when she went to the emergency room. They did not start her on anticoagulation in the hospital which may lead me to believe that there could of been question in the diagnosis of atrial fibrillation as well or perhaps it was not started because of short episode. Stress test, echocardiogram reassuring. Excellent. 2. Hypertension-improved today. Continue with current blood pressure changes. Agree with metoprolol as well as ACE inhibitor given her current condition. 3. Knee pain-currently bothering her. Arthritic pain. 4. Minor edema lower extremities-try compression hose. 5. We will see back in 1 year.  Signed, Candee Furbish, MD [facility]  08/11/2013 11:06 AM

## 2013-08-11 NOTE — Patient Instructions (Signed)
Your physician recommends that you continue on your current medications as directed. Please refer to the Current Medication list given to you today.  Your physician wants you to follow-up in: 1 year with Dr. [name]. You will receive a reminder letter in the mail two months in advance. If you don't receive a letter, please call our office to schedule the follow-up appointment.  Compression stocking : [email or web address] under Apparel

## 2013-09-16 ENCOUNTER — Other Ambulatory Visit: Payer: Self-pay | Admitting: Orthopedic Surgery

## 2013-09-16 ENCOUNTER — Encounter (HOSPITAL_COMMUNITY): Payer: Self-pay | Admitting: Emergency Medicine

## 2013-09-16 ENCOUNTER — Emergency Department (HOSPITAL_COMMUNITY)
Admission: EM | Admit: 2013-09-16 | Discharge: 2013-09-16 | Disposition: A | Discharge status: Home / Self Care | Payer: Medicare Other | Attending: Emergency Medicine | Admitting: Emergency Medicine

## 2013-09-16 DIAGNOSIS — I10 Essential (primary) hypertension: Secondary | ICD-10-CM | POA: Insufficient documentation

## 2013-09-16 DIAGNOSIS — W19XXXA Unspecified fall, initial encounter: Secondary | ICD-10-CM

## 2013-09-16 DIAGNOSIS — Z8781 Personal history of (healed) traumatic fracture: Secondary | ICD-10-CM | POA: Insufficient documentation

## 2013-09-16 DIAGNOSIS — Z872 Personal history of diseases of the skin and subcutaneous tissue: Secondary | ICD-10-CM | POA: Insufficient documentation

## 2013-09-16 DIAGNOSIS — G8911 Acute pain due to trauma: Secondary | ICD-10-CM | POA: Insufficient documentation

## 2013-09-16 DIAGNOSIS — M545 Low back pain, unspecified: Secondary | ICD-10-CM | POA: Insufficient documentation

## 2013-09-16 DIAGNOSIS — R55 Syncope and collapse: Secondary | ICD-10-CM | POA: Insufficient documentation

## 2013-09-16 DIAGNOSIS — E039 Hypothyroidism, unspecified: Secondary | ICD-10-CM | POA: Insufficient documentation

## 2013-09-16 DIAGNOSIS — G4700 Insomnia, unspecified: Secondary | ICD-10-CM | POA: Insufficient documentation

## 2013-09-16 DIAGNOSIS — R42 Dizziness and giddiness: Secondary | ICD-10-CM

## 2013-09-16 DIAGNOSIS — Z79899 Other long term (current) drug therapy: Secondary | ICD-10-CM | POA: Insufficient documentation

## 2013-09-16 LAB — COMPREHENSIVE METABOLIC PANEL
ALBUMIN: 3.5 g/dL (ref 3.5–5.2)
ALK PHOS: 113 U/L (ref 39–117)
ALT: 12 U/L (ref 0–35)
AST: 26 U/L (ref 0–37)
BUN: 21 mg/dL (ref 6–23)
CHLORIDE: 91 meq/L — AB (ref 96–112)
CO2: 25 mEq/L (ref 19–32)
Calcium: 9.6 mg/dL (ref 8.4–10.5)
Creatinine, Ser: 0.81 mg/dL (ref 0.50–1.10)
GFR calc Af Amer: 73 mL/min — ABNORMAL LOW (ref >90)
GFR calc non Af Amer: 63 mL/min — ABNORMAL LOW (ref >90)
Glucose, Bld: 74 mg/dL (ref 70–99)
Potassium: 4.8 mEq/L (ref 3.7–5.3)
SODIUM: 131 meq/L — AB (ref 137–147)
Total Bilirubin: 0.3 mg/dL (ref 0.3–1.2)
Total Protein: 7 g/dL (ref 6.0–8.3)

## 2013-09-16 LAB — URINALYSIS, ROUTINE W REFLEX MICROSCOPIC
BILIRUBIN URINE: NEGATIVE
GLUCOSE, UA: NEGATIVE mg/dL
Hgb urine dipstick: NEGATIVE
KETONES UR: NEGATIVE mg/dL
Nitrite: NEGATIVE
PROTEIN: NEGATIVE mg/dL
Specific Gravity, Urine: 1.006 (ref 1.005–1.030)
Urobilinogen, UA: 0.2 mg/dL (ref 0.0–1.0)
pH: 7 (ref 5.0–8.0)

## 2013-09-16 LAB — CBC
HEMATOCRIT: 40.1 % (ref 36.0–46.0)
HEMOGLOBIN: 13.7 g/dL (ref 12.0–15.0)
MCH: 31.1 pg (ref 26.0–34.0)
MCHC: 34.2 g/dL (ref 30.0–36.0)
MCV: 91.1 fL (ref 78.0–100.0)
Platelets: 145 10*3/uL — ABNORMAL LOW (ref 150–400)
RBC: 4.4 MIL/uL (ref 3.87–5.11)
RDW: 12.9 % (ref 11.5–15.5)
WBC: 7.7 10*3/uL (ref 4.0–10.5)

## 2013-09-16 LAB — URINE MICROSCOPIC-ADD ON

## 2013-09-16 MED ORDER — SODIUM CHLORIDE 0.9 % IV SOLN: INTRAVENOUS | Status: DC

## 2013-09-16 NOTE — Discharge Instructions (Signed)
Rest. Drink plenty of fluids. Use caution/walker when ambulating.  When getting up from lying position, get up slowly, and pause/sit on edge of bed or couch before trying to stand or walk.  Avoid taking sedating medications at the same time as one another.  Follow up as planned regarding your compression fracture. Follow up with your doctor in coming week for recheck.  Your blood pressure was high earlier, have it rechecked by your doctor.  Return to ER if worse, new symptoms, fevers, fainting, chest pain, trouble breathing, new pain, other concern.      Fall Prevention and Home Safety Falls cause injuries and can affect all age groups. It is possible to use preventive measures to significantly decrease the likelihood of falls. There are many simple measures which can make your home safer and prevent falls. OUTDOORS  Repair cracks and edges of walkways and driveways.  Remove high doorway thresholds.  Trim shrubbery on the main path into your home.  Have good outside lighting.  Clear walkways of tools, rocks, debris, and clutter.  Check that handrails are not broken and are securely fastened. Both sides of steps should have handrails.  Have leaves, snow, and ice cleared regularly.  Use sand or salt on walkways during winter months.  In the garage, clean up grease or oil spills. BATHROOM  Install night lights.  Install grab bars by the toilet and in the tub and shower.  Use non-skid mats or decals in the tub or shower.  Place a plastic non-slip stool in the shower to sit on, if needed.  Keep floors dry and clean up all water on the floor immediately.  Remove soap buildup in the tub or shower on a regular basis.  Secure bath mats with non-slip, double-sided rug tape.  Remove throw rugs and tripping hazards from the floors. BEDROOMS  Install night lights.  Make sure a bedside light is easy to reach.  Do not use oversized bedding.  Keep a telephone by your  bedside.  Have a firm chair with side arms to use for getting dressed.  Remove throw rugs and tripping hazards from the floor. KITCHEN  Keep handles on pots and pans turned toward the center of the stove. Use back burners when possible.  Clean up spills quickly and allow time for drying.  Avoid walking on wet floors.  Avoid hot utensils and knives.  Position shelves so they are not too high or low.  Place commonly used objects within easy reach.  If necessary, use a sturdy step stool with a grab bar when reaching.  Keep electrical cables out of the way.  Do not use floor polish or wax that makes floors slippery. If you must use wax, use non-skid floor wax.  Remove throw rugs and tripping hazards from the floor. STAIRWAYS  Never leave objects on stairs.  Place handrails on both sides of stairways and use them. Fix any loose handrails. Make sure handrails on both sides of the stairways are as long as the stairs.  Check carpeting to make sure it is firmly attached along stairs. Make repairs to worn or loose carpet promptly.  Avoid placing throw rugs at the top or bottom of stairways, or properly secure the rug with carpet tape to prevent slippage. Get rid of throw rugs, if possible.  Have an electrician put in a light switch at the top and bottom of the stairs. OTHER FALL PREVENTION TIPS  Wear low-heel or rubber-soled shoes that are supportive and fit  well. Wear closed toe shoes.  When using a stepladder, make sure it is fully opened and both spreaders are firmly locked. Do not climb a closed stepladder.  Add color or contrast paint or tape to grab bars and handrails in your home. Place contrasting color strips on first and last steps.  Learn and use mobility aids as needed. Install an electrical emergency response system.  Turn on lights to avoid dark areas. Replace light bulbs that burn out immediately. Get light switches that glow.  Arrange furniture to create clear  pathways. Keep furniture in the same place.  Firmly attach carpet with non-skid or double-sided tape.  Eliminate uneven floor surfaces.  Select a carpet pattern that does not visually hide the edge of steps.  Be aware of all pets. OTHER HOME SAFETY TIPS  Set the water temperature for 120 F (48.8 C).  Keep emergency numbers on or near the telephone.  Keep smoke detectors on every level of the home and near sleeping areas. Document Released: 05/12/2002 Document Revised: 11/21/2011 Document Reviewed: 08/11/2011 [facility] Patient Information 2014 [place].     Dizziness  Dizziness means you feel unsteady or lightheaded. You might feel like you are going to pass out (faint). HOME CARE   Drink enough fluids to keep your pee (urine) clear or pale yellow.  Take your medicines exactly as told by your doctor. If you take blood pressure medicine, always stand up slowly from the lying or sitting position. Hold on to something to steady yourself.  If you need to stand in one place for a long time, move your legs often. Tighten and relax your leg muscles.  Have someone stay with you until you feel okay.  Do not drive or use heavy machinery if you feel dizzy.  Do not drink alcohol. GET HELP RIGHT AWAY IF:   You feel dizzy or lightheaded and it gets worse.  You feel sick to your stomach (nauseous), or you throw up (vomit).  You have trouble talking or walking.  You feel weak or have trouble using your arms, hands, or legs.  You cannot think clearly or have trouble forming sentences.  You have chest pain, belly (abdominal) pain, sweating, or you are short of breath.  Your vision changes.  You are bleeding.  You have problems from your medicine that seem to be getting worse. MAKE SURE YOU:   Understand these instructions.  Will watch your condition.  Will get help right away if you are not doing well or get worse. Document Released: 05/11/2011 Document Revised:  08/14/2011 Document Reviewed: 05/11/2011 [facility] Patient Information 2014 [place], [place].    Near-Syncope Near-syncope (commonly known as near fainting) is sudden weakness, dizziness, or feeling like you might pass out. During an episode of near-syncope, you may also develop pale skin, have tunnel vision, or feel sick to your stomach (nauseous). Near-syncope may occur when getting up after sitting or while standing for a long time. It is caused by a sudden decrease in blood flow to the brain. This decrease can result from various causes or triggers, most of which are not serious. However, because near-syncope can sometimes be a sign of something serious, a medical evaluation is required. The specific cause is often not determined. HOME CARE INSTRUCTIONS  Monitor your condition for any changes. The following actions may help to alleviate any discomfort you are experiencing:  Have someone stay with you until you feel stable.  Lie down right away if you start feeling like you  might faint. Breathe deeply and steadily. Wait until all the symptoms have passed. Most of these episodes last only a few minutes. You may feel tired for several hours.   Drink enough fluids to keep your urine clear or pale yellow.   If you are taking blood pressure or heart medicine, get up slowly when seated or lying down. Take several minutes to sit and then stand. This can reduce dizziness.  Follow up with your health care provider as directed. SEEK IMMEDIATE MEDICAL CARE IF:   You have a severe headache.   You have unusual pain in the chest, abdomen, or back.   You are bleeding from the mouth or rectum, or you have black or tarry stool.   You have an irregular or very fast heartbeat.   You have repeated fainting or have seizure-like jerking during an episode.   You faint when sitting or lying down.   You have confusion.   You have difficulty walking.   You have severe weakness.   You have  vision problems.  MAKE SURE YOU:   Understand these instructions.  Will watch your condition.  Will get help right away if you are not doing well or get worse. Document Released: 05/22/2005 Document Revised: 01/22/2013 Document Reviewed: 10/25/2012 [facility] Patient Information 2014 [place].    Back, Compression Fracture A compression fracture happens when a force is put upon the length of your spine. Slipping and falling on your bottom are examples of such a force. When this happens, sometimes the force is great enough to compress the building blocks (vertebral bodies) of your spine. Although this causes a lot of pain, this can usually be treated at home, unless your caregiver feels hospitalization is needed for pain control. Your backbone (spinal column) is made up of 24 main vertebral bodies in addition to the sacrum and coccyx (see illustration). These are held together by tough fibrous tissues (ligaments) and by support of your muscles. Nerve roots pass through the openings between the vertebrae. A sudden wrenching move, injury, or a fall may cause a compression fracture of one of the vertebral bodies. This may result in back pain or spread of pain into the belly (abdomen), the buttocks, and down the leg into the foot. Pain may also be created by muscle spasm alone. Large studies have been undertaken to determine the best possible course of action to help your back following injury and also to prevent future problems. The recommendations are as follows. FOLLOWING A COMPRESSION FRACTURE: Do the following only if advised by your caregiver.   If a back brace has been suggested or provided, wear it as directed.  DO NOT stop wearing the back brace unless instructed by your caregiver.  When allowed to return to regular activities, avoid a sedentary life style. Actively exercise. Sporadic weekend binges of tennis, racquetball, water skiing, may actually aggravate or create problems,  especially if you are not in condition for that activity.  Avoid sports requiring sudden body movements until you are in condition for them. Swimming and walking are safer activities.  Maintain good posture.  Avoid obesity.  If not already done, you should have a DEXA scan. Based on the results, be treated for osteoporosis. FOLLOWING ACUTE (SUDDEN) INJURY:  Only take over-the-counter or prescription medicines for pain, discomfort, or fever as directed by your caregiver.  Use bed rest for only the most extreme acute episode. Prolonged bed rest may aggravate your condition. Ice used for acute conditions is effective. Use  a large plastic bag filled with ice. Wrap it in a towel. This also provides excellent pain relief. This may be continuous. Or use it for 30 minutes every 2 hours during acute phase, then as needed. Heat for 30 minutes prior to activities is helpful.  As soon as the acute phase (the time when your back is too painful for you to do normal activities) is over, it is important to resume normal activities and work [employer] information [employer] manager. Back injuries can cause potentially marked changes in lifestyle. So it is important to attack these problems aggressively.  See your caregiver for continued problems. He or she can help or refer you for appropriate exercises, physical therapy and work hardening if needed.  If you are given narcotic medications for your condition, for the next 24 hours DO NOT:  Drive  Operate machinery or power tools.  Sign legal documents.  DO NOT drink alcohol, take sleeping pills or other medications that may interfere with treatment. If your caregiver has given you a follow-up appointment, it is very important to keep that appointment. Not keeping the appointment could result in a chronic or permanent injury, pain, and disability. If there is any problem keeping the appointment, you must call back to this facility for assistance.  SEEK IMMEDIATE MEDICAL CARE  IF:  You develop numbness, tingling, weakness, or problems with the use of your arms or legs.  You develop severe back pain not relieved with medications.  You have changes in bowel or bladder control.  You have increasing pain in any areas of the body. Document Released: 05/22/2005 Document Revised: 08/14/2011 Document Reviewed: 12/25/2007 [facility] Patient Information 2014 [place].   Hypertension As your heart beats, it forces blood through your arteries. This force is your blood pressure. If the pressure is too high, it is called hypertension (HTN) or high blood pressure. HTN is dangerous because you may have it and not know it. High blood pressure may mean that your heart has to work harder to pump blood. Your arteries may be narrow or stiff. The extra work puts you at risk for heart disease, stroke, and other problems.  Blood pressure consists of two numbers, a higher number over a lower, 110/72, for example. It is stated as "110 over 72." The ideal is below 120 for the top number (systolic) and under 80 for the bottom (diastolic). Write down your blood pressure today. You should pay close attention to your blood pressure if you have certain conditions such as:  Heart failure.  Prior heart attack.  Diabetes  Chronic kidney disease.  Prior stroke.  Multiple risk factors for heart disease. To see if you have HTN, your blood pressure should be measured while you are seated with your arm held at the level of the heart. It should be measured at least twice. A one-time elevated blood pressure reading (especially in the Emergency Department) does not mean that you need treatment. There may be conditions in which the blood pressure is different between your right and left arms. It is important to see your caregiver soon for a recheck. Most people have essential hypertension which means that there is not a specific cause. This type of high blood pressure may be lowered by changing  lifestyle factors such as:  Stress.  Smoking.  Lack of exercise.  Excessive weight.  Drug/tobacco/alcohol use.  Eating less salt. Most people do not have symptoms from high blood pressure until it has caused damage to the body. Effective treatment can  often prevent, delay or reduce that damage. TREATMENT  When a cause has been identified, treatment for high blood pressure is directed at the cause. There are a large number of medications to treat HTN. These fall into several categories, and your caregiver will help you select the medicines that are best for you. Medications may have side effects. You should review side effects with your caregiver. If your blood pressure stays high after you have made lifestyle changes or started on medicines,   Your medication(s) may need to be changed.  Other problems may need to be addressed.  Be certain you understand your prescriptions, and know how and when to take your medicine.  Be sure to follow up with your caregiver within the time frame advised (usually within two weeks) to have your blood pressure rechecked and to review your medications.  If you are taking more than one medicine to lower your blood pressure, make sure you know how and at what times they should be taken. Taking two medicines at the same time can result in blood pressure that is too low. SEEK IMMEDIATE MEDICAL CARE IF:  You develop a severe headache, blurred or changing vision, or confusion.  You have unusual weakness or numbness, or a faint feeling.  You have severe chest or abdominal pain, vomiting, or breathing problems. MAKE SURE YOU:   Understand these instructions.  Will watch your condition.  Will get help right away if you are not doing well or get worse. Document Released: 05/22/2005 Document Revised: 08/14/2011 Document Reviewed: 01/10/2008 [facility] Patient Information 2014 [place].

## 2013-09-16 NOTE — ED Notes (Signed)
Gave patient hydrocodone 5mg . (patient's own medication)  Dr. Ronne Binning.

## 2013-09-16 NOTE — ED Notes (Signed)
Bed: WA09 Expected date:  Expected time:  Means of arrival:  Comments: Old person, fall

## 2013-09-16 NOTE — ED Notes (Signed)
Lab phlebotomy at bedside 

## 2013-09-16 NOTE — ED Provider Notes (Addendum)
CSN: [id number]     Arrival date & time 09/16/13  1110 History   First MD Initiated Contact with Patient 09/16/13 1116     Chief Complaint  Patient presents with  . Fall     (Consider location/radiation/quality/duration/timing/severity/associated sxs/prior Treatment) Patient is a 80 y.o. female presenting with fall. The history is provided by the patient and the EMS personnel.  Fall Pertinent negatives include no chest pain, no abdominal pain, no headaches and no shortness of breath.  pt s/p fall at home this morning. Was in bathroom, states felt lightheaded/faint, then stumbled, and fell. States normally walks w cane and/or walker, but did not have her walker when fell. States prior to a fall a week or two ago she used cane, but since has been using walker. States that fall was a trip and fall, no faintness or dizziness. States had compression fx w that fall, and is due to have kyphoplasty in a few days. This morning had eaten, took both valium and vicodin, and then later felt dizzy as was ambulating to bathroom.   Spouse indicated to ems he felt fall may be related to being sedating on vicodin and valium. Pt denies any chest pain or discomfort. No palpitations or sense of irregular heartbeat. No sob or unusual doe. No fever or chills. No dysuria or gu c/o. No abd pain. No nvd. No melena or black stools. w todays fall, denies pain or injury. No new neck or back pain (although lower back painful from prior fall). No headache. No extremity pain or injury.      Past Medical History  Diagnosis Date  . HTN (hypertension)   . Hypothyroidism   . Insomnia   . Actinic keratoses    Past Surgical History  Procedure Laterality Date  . Breast lumpectomy     No family history on file. History  Substance Use Topics  . Smoking status: Never Smoker   . Smokeless tobacco: Not on file  . Alcohol Use: Not on file   OB History   Grav Para Term Preterm Abortions TAB SAB Ect Mult Living                  Review of Systems  Constitutional: Negative for fever and chills.  HENT: Negative for sore throat.   Eyes: Negative for redness.  Respiratory: Negative for shortness of breath.   Cardiovascular: Negative for chest pain.  Gastrointestinal: Negative for vomiting, abdominal pain, diarrhea and blood in stool.  Genitourinary: Negative for dysuria and flank pain.  Musculoskeletal: Negative for back pain and neck pain.  Skin: Negative for rash.  Neurological: Negative for weakness, numbness and headaches.  Hematological: Does not bruise/bleed easily.  Psychiatric/Behavioral: Negative for confusion.      Allergies  Review of patient's allergies indicates no known allergies.  Home Medications   Prior to Admission medications   Medication Sig Start Date End Date Taking? Authorizing Provider  albuterol (PROVENTIL HFA;VENTOLIN HFA) 108 (90 BASE) MCG/ACT inhaler Inhale 2 puffs into the lungs every 6 (six) hours as needed for wheezing or shortness of breath.    Historical Provider, MD  Alum & Mag Hydroxide-Simeth (MAG-AL PLUS PO) Take by mouth.    Historical Provider, MD  aspirin 81 MG tablet Take 81 mg by mouth daily.    Historical Provider, MD  calcium citrate-vitamin D (CITRACAL+D) 315-200 MG-UNIT per tablet Take 1 tablet by mouth daily.    Historical Provider, MD  cholecalciferol (VITAMIN D) 400 UNITS TABS tablet Take 400  Units by mouth daily.    Historical Provider, MD  famotidine-calcium carbonate-magnesium hydroxide (PEPCID COMPLETE) 10-800-165 MG CHEW chewable tablet Chew 1 tablet by mouth daily as needed.    Historical Provider, MD  levothyroxine (SYNTHROID, LEVOTHROID) 125 MCG tablet Take 125 mcg by mouth daily before breakfast.    Historical Provider, MD  lisinopril (PRINIVIL,ZESTRIL) 10 MG tablet 10 mg daily. 04/22/13   Historical Provider, MD  metoprolol (LOPRESSOR) 100 MG tablet Take 100 mg by mouth 2 (two) times daily.    Historical Provider, MD  mirabegron ER (MYRBETRIQ) 50  MG TB24 tablet Take 50 mg by mouth daily.    Historical Provider, MD  Multiple Vitamin (MULTIVITAMIN) tablet Take 1 tablet by mouth daily.    Historical Provider, MD  zolpidem (AMBIEN) 10 MG tablet Take 10 mg by mouth at bedtime as needed for sleep.    Historical Provider, MD   BP 149/60  Pulse 62  Temp(Src) 97.3 F (36.3 C) (Oral)  Resp 16  SpO2 98% Physical Exam  Nursing note and vitals reviewed. Constitutional: She is oriented to person, place, and time. She appears well-developed and well-nourished. No distress.  HENT:  Head: Atraumatic.  Mouth/Throat: Oropharynx is clear and moist.  No scalp or facial sts or tenderness  Eyes: Conjunctivae are normal. Pupils are equal, round, and reactive to light. No scleral icterus.  Neck: Normal range of motion. Neck supple. No tracheal deviation present. No thyromegaly present.  No bruit  Cardiovascular: Normal rate, regular rhythm, normal heart sounds and intact distal pulses.   Pulmonary/Chest: Effort normal and breath sounds normal. No respiratory distress. She exhibits no tenderness.  Abdominal: Soft. Normal appearance and bowel sounds are normal. She exhibits no distension and no mass. There is no tenderness. There is no rebound and no guarding.  Genitourinary:  No cva tenderness  Musculoskeletal: She exhibits no edema and no tenderness.  CTLS spine, non tender, aligned, no step off. Good rom bil ext without pain or focal bony tenderness.   Neurological: She is alert and oriented to person, place, and time.  Skin: Skin is warm and dry. No rash noted. She is not diaphoretic.  Psychiatric: She has a normal mood and affect.    ED Course  Procedures (including critical care time)   Results for orders placed during the hospital encounter of 09/16/13  COMPREHENSIVE METABOLIC PANEL      Result Value Ref Range   Sodium 131 (*) 137 - 147 mEq/L   Potassium 4.8  3.7 - 5.3 mEq/L   Chloride 91 (*) 96 - 112 mEq/L   CO2 25  19 - 32 mEq/L    Glucose, Bld 74  70 - 99 mg/dL   BUN 21  6 - 23 mg/dL   Creatinine, Ser 0.81  0.50 - 1.10 mg/dL   Calcium 9.6  8.4 - 10.5 mg/dL   Total Protein 7.0  6.0 - 8.3 g/dL   Albumin 3.5  3.5 - 5.2 g/dL   AST 26  0 - 37 U/L   ALT 12  0 - 35 U/L   Alkaline Phosphatase 113  39 - 117 U/L   Total Bilirubin 0.3  0.3 - 1.2 mg/dL   GFR calc non Af Amer 63 (*) >90 mL/min   GFR calc Af Amer 73 (*) >90 mL/min  URINALYSIS, ROUTINE W REFLEX MICROSCOPIC      Result Value Ref Range   Color, Urine YELLOW  YELLOW   APPearance CLEAR  CLEAR   Specific Gravity, Urine  1.006  1.005 - 1.030   pH 7.0  5.0 - 8.0   Glucose, UA NEGATIVE  NEGATIVE mg/dL   Hgb urine dipstick NEGATIVE  NEGATIVE   Bilirubin Urine NEGATIVE  NEGATIVE   Ketones, ur NEGATIVE  NEGATIVE mg/dL   Protein, ur NEGATIVE  NEGATIVE mg/dL   Urobilinogen, UA 0.2  0.0 - 1.0 mg/dL   Nitrite NEGATIVE  NEGATIVE   Leukocytes, UA TRACE (*) NEGATIVE  CBC      Result Value Ref Range   WBC 7.7  4.0 - 10.5 K/uL   RBC 4.40  3.87 - 5.11 MIL/uL   Hemoglobin 13.7  12.0 - 15.0 g/dL   HCT 40.1  36.0 - 46.0 %   MCV 91.1  78.0 - 100.0 fL   MCH 31.1  26.0 - 34.0 pg   MCHC 34.2  30.0 - 36.0 g/dL   RDW 12.9  11.5 - 15.5 %   Platelets 145 (*) 150 - 400 K/uL  URINE MICROSCOPIC-ADD ON      Result Value Ref Range   Squamous Epithelial / LPF RARE  RARE   WBC, UA 0-2  <3 WBC/hpf   Bacteria, UA RARE  RARE       EKG Interpretation   Date/Time:  Tuesday September 16 2013 11:58:48 EDT Ventricular Rate:  71 PR Interval:  268 QRS Duration: 72 QT Interval:  409 QTC Calculation: 444 R Axis:   40 Text Interpretation:  Sinus rhythm Prolonged PR interval Baseline wander  in lead(s) V1 No significant change since last tracing Confirmed by Daschel Roughton   MD, Lennette Bihari ([id number]) on 09/16/2013 12:24:05 PM      MDM  Iv ns. Labs. Monitor. Ecg.  Reviewed nursing notes and prior charts for additional history.   Po fluids.  On monitor, remains in nsr throughout ED  stay.  Recheck, asymptomatic, no faintness or dizziness.   Pt requests to take her home pain meds re recent compression fxs.  Provided.  Pt/fam indicate to have kyphopplasty later in week.  Pain controlled.   Pt ambulatory in ED w assistance.   Pt appears stable for d/c.         Mirna Mires, MD 09/16/13 [phone]

## 2013-09-16 NOTE — ED Notes (Signed)
Patient fell at home in the bathroom.  No LOC, did not hit head.  Husband reports she took a valium and vicodin together this morning and he feels this could mask her pain.  No bruising, deformities, or lacerations.  Alert and oriented.

## 2013-09-17 ENCOUNTER — Encounter (HOSPITAL_COMMUNITY): Payer: Self-pay | Admitting: Pharmacy Technician

## 2013-09-17 ENCOUNTER — Encounter (HOSPITAL_COMMUNITY)
Admission: RE | Admit: 2013-09-17 | Discharge: 2013-09-17 | Disposition: A | Discharge status: Home / Self Care | Payer: Medicare Other | Source: Ambulatory Visit | Attending: Orthopedic Surgery | Admitting: Orthopedic Surgery

## 2013-09-17 ENCOUNTER — Ambulatory Visit (HOSPITAL_COMMUNITY)
Admission: RE | Admit: 2013-09-17 | Discharge: 2013-09-17 | Disposition: A | Discharge status: Home / Self Care | Payer: Medicare Other | Source: Ambulatory Visit | Attending: Orthopedic Surgery | Admitting: Orthopedic Surgery

## 2013-09-17 ENCOUNTER — Encounter (HOSPITAL_COMMUNITY): Payer: Self-pay

## 2013-09-17 ENCOUNTER — Other Ambulatory Visit (HOSPITAL_COMMUNITY): Payer: Self-pay | Admitting: *Deleted

## 2013-09-17 DIAGNOSIS — Z01818 Encounter for other preprocedural examination: Secondary | ICD-10-CM | POA: Insufficient documentation

## 2013-09-17 DIAGNOSIS — J479 Bronchiectasis, uncomplicated: Secondary | ICD-10-CM | POA: Insufficient documentation

## 2013-09-17 DIAGNOSIS — Z01812 Encounter for preprocedural laboratory examination: Secondary | ICD-10-CM

## 2013-09-17 HISTORY — DX: Wedge compression fracture of third lumbar vertebra, initial encounter for closed fracture: S32.030A

## 2013-09-17 HISTORY — DX: Unspecified osteoarthritis, unspecified site: M19.90

## 2013-09-17 HISTORY — DX: Gastro-esophageal reflux disease without esophagitis: K21.9

## 2013-09-17 HISTORY — DX: Unspecified atrial fibrillation: I48.91

## 2013-09-17 LAB — DIFFERENTIAL
Basophils Absolute: 0 10*3/uL (ref 0.0–0.1)
Basophils Relative: 1 % (ref 0–1)
Eosinophils Absolute: 0.4 10*3/uL (ref 0.0–0.7)
Eosinophils Relative: 5 % (ref 0–5)
LYMPHS ABS: 2 10*3/uL (ref 0.7–4.0)
Lymphocytes Relative: 28 % (ref 12–46)
Monocytes Absolute: 0.7 10*3/uL (ref 0.1–1.0)
Monocytes Relative: 10 % (ref 3–12)
NEUTROS ABS: 4 10*3/uL (ref 1.7–7.7)
NEUTROS PCT: 56 % (ref 43–77)

## 2013-09-17 LAB — SURGICAL PCR SCREEN
MRSA, PCR: NEGATIVE
STAPHYLOCOCCUS AUREUS: NEGATIVE

## 2013-09-17 LAB — TYPE AND SCREEN
ABO/RH(D): O POS
Antibody Screen: NEGATIVE

## 2013-09-17 LAB — APTT: aPTT: 30 seconds (ref 24–37)

## 2013-09-17 LAB — PROTIME-INR
INR: 0.99 (ref 0.00–1.49)
Prothrombin Time: 12.9 seconds (ref 11.6–15.2)

## 2013-09-17 IMAGING — CR DG CHEST 2V
3 series · 3 of 3 positions shown · non-contrast
Comparison: DG CHEST 2V dated [DATE]; DG CHEST 2 VIEW dated
[DATE]

CLINICAL DATA: Preop

EXAM:
CHEST  2 VIEW

[w chest lat]
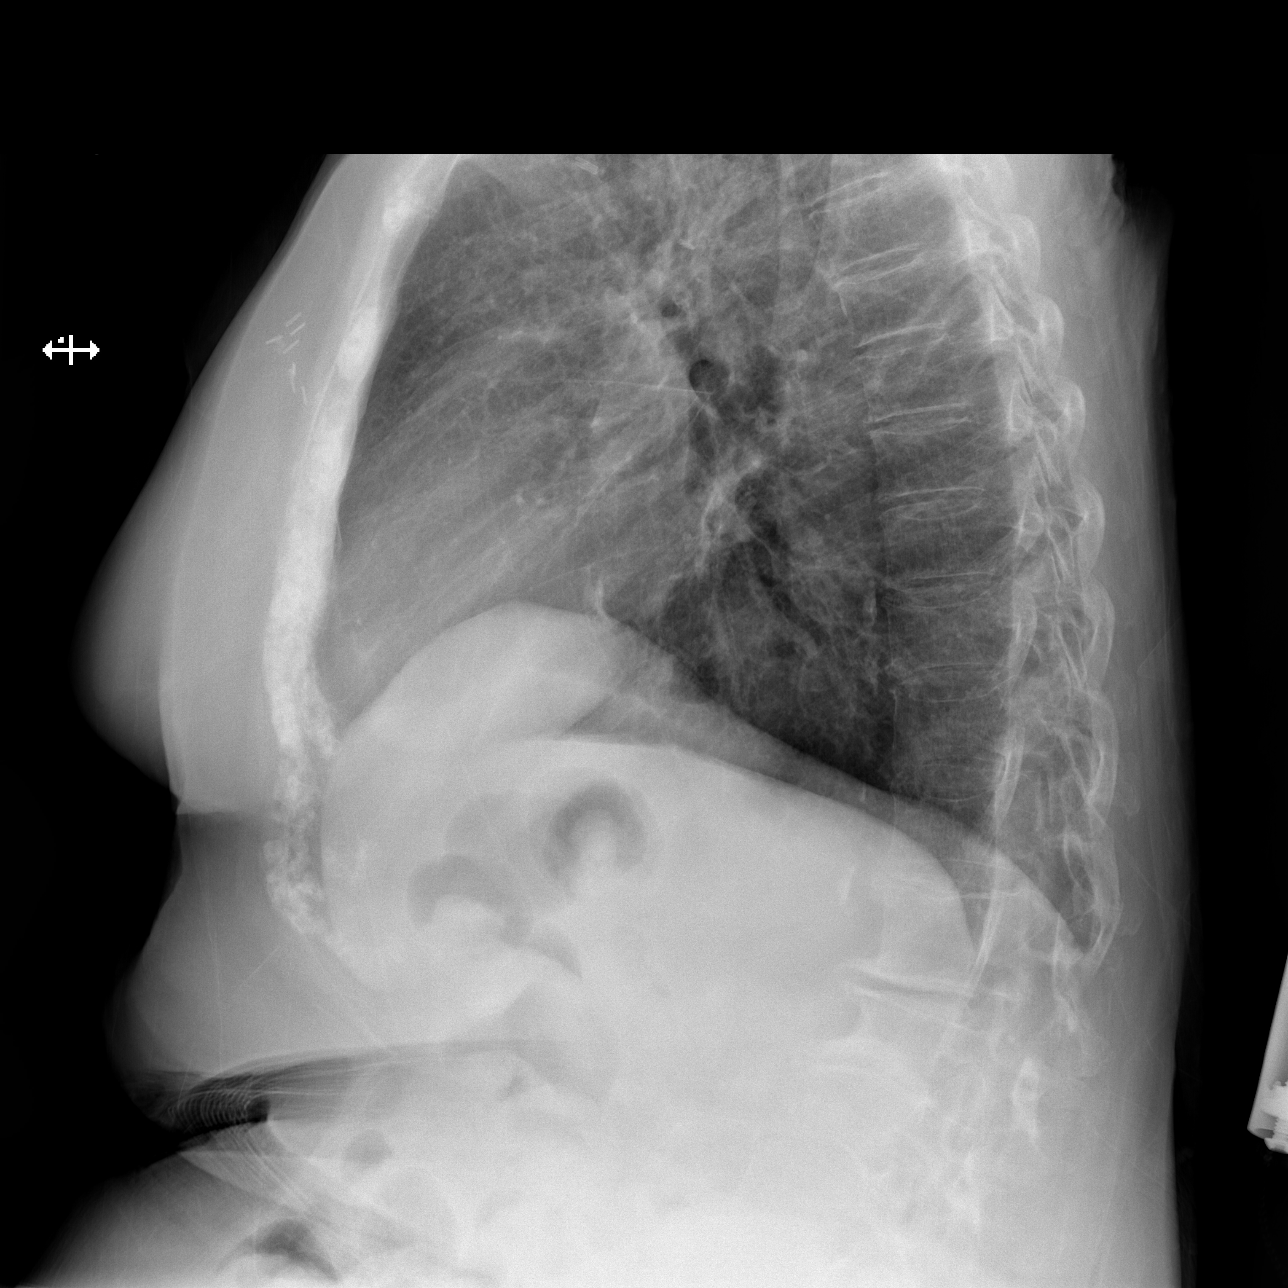

[x chest ap (1 of 2)]
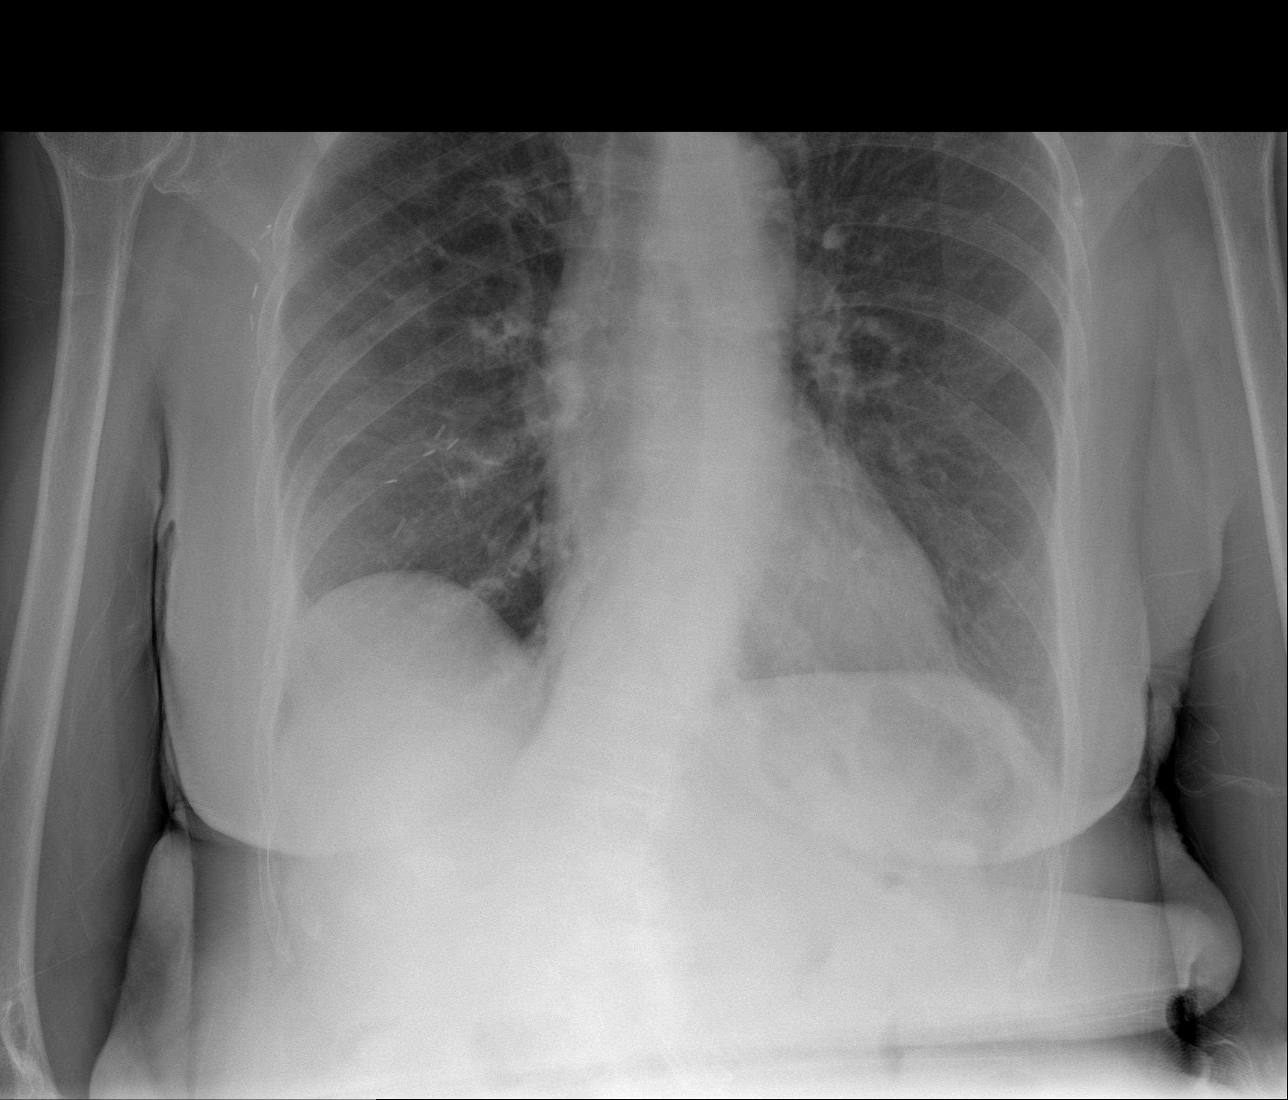

[x chest ap (2 of 2)]
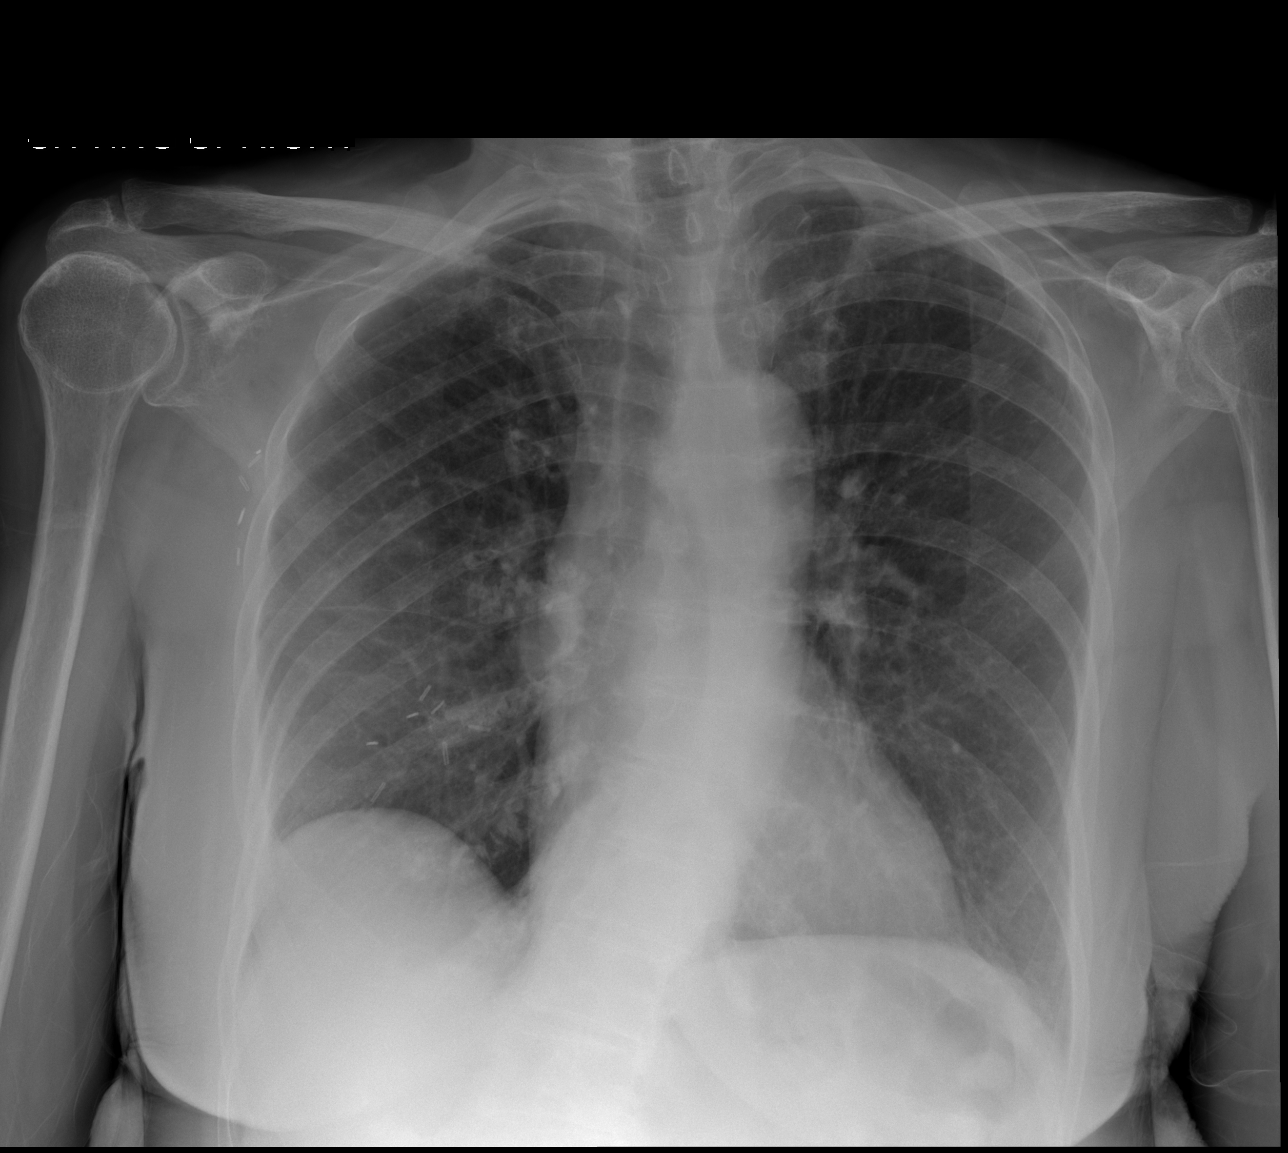

[3 of 3 positions shown; findings below may reference images not displayed]

FINDINGS: Chronic elevation of the right hemidiaphragm. There is diffuse
prominence of interstitial markings and areas of central
bronchiectasis. Regions of peribronchial thickening are appreciated.
These findings are unchanged from prior study dated [DATE]. No
new focal regions of consolidation or new focal infiltrates.
Postsurgical changes within the right breast and right axilla. There
is dextroscoliosis of the thoracolumbar spine with rotatory
component. Degenerative changes appreciated within the shoulders.
IMPRESSION: Chronic interstitial changes without acute cardiopulmonary disease.

## 2013-09-17 MED ORDER — POVIDONE-IODINE 7.5 % EX SOLN
Freq: Once | CUTANEOUS | Status: DC
  Filled 2013-09-17: qty 118

## 2013-09-17 MED ORDER — CEFAZOLIN SODIUM-DEXTROSE 2-3 GM-% IV SOLR
2.0000 g | INTRAVENOUS | Status: AC
  Administered 2013-09-18: 2 g via INTRAVENOUS
  Filled 2013-09-17: qty 50

## 2013-09-17 NOTE — Progress Notes (Signed)
Pt very sleepy on arrival to PAT appt. Husband states she had a Hydrocodone and half of a Valium prior to coming. She arouses easily, oriented x 3.

## 2013-09-17 NOTE — H&P (Signed)
PREOPERATIVE H&P  Chief Complaint: low back pain  HPI: Cassandra Ray is a 80 y.o. female who presents with ongoing back pain. MRI reveals compression fractures at the L2 and L3 vertebral bodies. Patient failed conservative care and elected to proceed with a kyphoplasty procedure.   Past Medical History  Diagnosis Date  . HTN (hypertension)   . Hypothyroidism   . Insomnia   . Actinic keratoses   . Atrial fibrillation     "only one time"  . Arthritis     back   . Cancer     Breast cancer (right)  . Compression fracture of L3 lumbar vertebra   . GERD (gastroesophageal reflux disease)     takes otc Pepcid as needed   Past Surgical History  Procedure Laterality Date  . Breast lumpectomy Right 1995    lumpectomy  . Tonsillectomy    . Colonoscopy    . Eye surgery Bilateral     cataract w/ lens implant   History   Social History  . Marital Status: Married    Spouse Name: N/A    Number of Children: N/A  . Years of Education: N/A   Social History Main Topics  . Smoking status: Never Smoker   . Smokeless tobacco: Never Used  . Alcohol Use: 4.2 oz/week    7 Glasses of wine per week  . Drug Use: No  . Sexual Activity: Not on file   Other Topics Concern  . Not on file   Social History Narrative  . No narrative on file   No family history on file. No Known Allergies Prior to Admission medications   Medication Sig Start Date End Date Taking? Authorizing Provider  Acetaminophen (TYLENOL PO) Take 2 tablets by mouth every 6 (six) hours as needed (pain).   Yes Historical Provider, MD  albuterol (PROVENTIL HFA;VENTOLIN HFA) 108 (90 BASE) MCG/ACT inhaler Inhale 2 puffs into the lungs every 6 (six) hours as needed for wheezing or shortness of breath.   Yes Historical Provider, MD  calcium citrate-vitamin D (CITRACAL+D) 315-200 MG-UNIT per tablet Take 1 tablet by mouth daily.   Yes Historical Provider, MD  cholecalciferol (VITAMIN D) 400 UNITS TABS tablet Take 400 Units by  mouth daily.   Yes Historical Provider, MD  diazepam (VALIUM) 5 MG tablet Take 5 mg by mouth every 6 (six) hours as needed for anxiety or muscle spasms.   Yes Historical Provider, MD  famotidine-calcium carbonate-magnesium hydroxide (PEPCID COMPLETE) 10-800-165 MG CHEW chewable tablet Chew 1 tablet by mouth daily as needed (indegestion).    Yes Historical Provider, MD  HYDROcodone-acetaminophen (NORCO/VICODIN) 5-325 MG per tablet Take 1 tablet by mouth every 6 (six) hours as needed for moderate pain.   Yes Historical Provider, MD  levothyroxine (SYNTHROID, LEVOTHROID) 125 MCG tablet Take 125 mcg by mouth daily before breakfast.   Yes Historical Provider, MD  lisinopril (PRINIVIL,ZESTRIL) 10 MG tablet Take 10 mg by mouth daily.  04/22/13  Yes Historical Provider, MD  metoprolol (LOPRESSOR) 100 MG tablet Take 100 mg by mouth 2 (two) times daily.   Yes Historical Provider, MD  mirabegron ER (MYRBETRIQ) 50 MG TB24 tablet Take 50 mg by mouth daily.   Yes Historical Provider, MD  Multiple Vitamin (MULTIVITAMIN) tablet Take 1 tablet by mouth daily.   Yes Historical Provider, MD  polyethylene glycol (MIRALAX / GLYCOLAX) packet Take 17 g by mouth daily as needed for mild constipation.   Yes Historical Provider, MD  promethazine (PHENERGAN) 25 MG tablet  Take 12.5-25 mg by mouth every 6 (six) hours as needed for nausea or vomiting. Take 1/2-1 tablet as needed for nausea   Yes Historical Provider, MD     All other systems have been reviewed and were otherwise negative with the exception of those mentioned in the HPI and as above.  Physical Exam: There were no vitals filed for this visit.  General: Alert, no acute distress Cardiovascular: No pedal edema Respiratory: No cyanosis, no use of accessory musculature GI: No organomegaly, abdomen is soft and non-tender Skin: No lesions in the area of chief complaint Neurologic: Sensation intact distally Psychiatric: Patient is competent for consent with normal  mood and affect Lymphatic: No axillary or cervical lymphadenopathy  MUSCULOSKELETAL: + TTP low back   Assessment/Plan: Lumbar 2 and 3 compression fracture Plan for Procedure(s): KYPHOPLASTY L2 and L3   Sinclair Ship, MD 09/17/2013 4:11 PM

## 2013-09-17 NOTE — Pre-Procedure Instructions (Signed)
Cassandra Ray  09/17/2013   Your procedure is scheduled on:  Thursday, September 18, 2013 at 7:15 AM.   Report to [facility] Entrance "A" Admitting Office at 5:30 AM.   Call this number if you have problems the morning of surgery: [phone]   Remember:   Do not eat food or drink liquids after midnight tonight.   Take these medicines the morning of surgery with A SIP OF WATER: levothyroxine (SYNTHROID, LEVOTHROID),  metoprolol (LOPRESSOR), diazepam (VALIUM) - if needed, Pepcid - if needed, promethazine (PHENERGAN) - if needed,  HYDROcodone-acetaminophen (NORCO/VICODIN) - if needed, you may also use your Albuterol inhaler if needed.  Please bring your inhaler with you in the morning.   Do not wear jewelry, make-up or nail polish.  Do not wear lotions, powders, or perfumes. You may wear deodorant.  Do not shave 48 hours prior to surgery.   Do not bring valuables to the hospital.  [facility] is not responsible                  for any belongings or valuables.               Contacts, dentures or bridgework may not be worn into surgery.  Leave suitcase in the car. After surgery it may be brought to your room.  For patients admitted to the hospital, discharge time is determined by your                treatment team.          Special Instructions: Cassandra - Preparing for Surgery  Before surgery, you can play an important role.  Because skin is not sterile, your skin needs to be as free of germs as possible.  You can reduce the number of germs on you skin by washing with CHG (chlorahexidine gluconate) soap before surgery.  CHG is an antiseptic cleaner which kills germs and bonds with the skin to continue killing germs even after washing.  Please DO NOT use if you have an allergy to CHG or antibacterial soaps.  If your skin becomes reddened/irritated stop using the CHG and inform your nurse when you arrive at Short Stay.  Do not shave (including legs and underarms) for at least  48 hours prior to the first CHG shower.  You may shave your face.  Please follow these instructions carefully:   1.  Shower with CHG Soap the night before surgery and the                                morning of Surgery.  2.  If you choose to wash your hair, wash your hair first as usual with your       normal shampoo.  3.  After you shampoo, rinse your hair and body thoroughly to remove the                      Shampoo.  4.  Use CHG as you would any other liquid soap.  You can apply chg directly       to the skin and wash gently with scrungie or a clean washcloth.  5.  Apply the CHG Soap to your body ONLY FROM THE NECK DOWN.        Do not use on open wounds or open sores.  Avoid contact with your eyes, ears, mouth and genitals (private parts).  Wash genitals (private parts) with your normal soap.  6.  Wash thoroughly, paying special attention to the area where your surgery        will be performed.  7.  Thoroughly rinse your body with warm water from the neck down.  8.  DO NOT shower/wash with your normal soap after using and rinsing off       the CHG Soap.  9.  Pat yourself dry with a clean towel.            10.  Wear clean pajamas.            11.  Place clean sheets on your bed the night of your first shower and do not        sleep with pets.  Day of Surgery  Do not apply any lotions the morning of surgery.  Please wear clean clothes to the hospital/surgery center.     Please read over the following fact sheets that you were given: Pain Booklet, Coughing and Deep Breathing, Blood Transfusion Information, MRSA Information and Surgical Site Infection Prevention

## 2013-09-17 NOTE — Progress Notes (Signed)
Pt's cardiologist is Dr. Candee Furbish. Saw him August 11, 2013 and was told everything looked great. Was told to return in a year.   Pt denies any chest pain or sob.

## 2013-09-18 ENCOUNTER — Inpatient Hospital Stay (HOSPITAL_COMMUNITY): Payer: Medicare Other | Admitting: Anesthesiology

## 2013-09-18 ENCOUNTER — Inpatient Hospital Stay (HOSPITAL_COMMUNITY): Payer: Medicare Other

## 2013-09-18 ENCOUNTER — Encounter (HOSPITAL_COMMUNITY)
Admission: RE | Disposition: A | Discharge status: Other Acute Inpatient Hospital | Payer: Self-pay | Source: Ambulatory Visit | Attending: Orthopedic Surgery

## 2013-09-18 ENCOUNTER — Encounter (HOSPITAL_COMMUNITY): Payer: Medicare Other | Admitting: Anesthesiology

## 2013-09-18 ENCOUNTER — Observation Stay (HOSPITAL_COMMUNITY)
Admission: RE | Admit: 2013-09-18 | Discharge: 2013-09-19 | Disposition: A | Discharge status: Nursing Home (Includes ICF) | Payer: Medicare Other | Source: Ambulatory Visit | Attending: Orthopedic Surgery | Admitting: Orthopedic Surgery

## 2013-09-18 ENCOUNTER — Encounter (HOSPITAL_COMMUNITY): Payer: Self-pay | Admitting: *Deleted

## 2013-09-18 DIAGNOSIS — I4891 Unspecified atrial fibrillation: Secondary | ICD-10-CM | POA: Insufficient documentation

## 2013-09-18 DIAGNOSIS — W19XXXA Unspecified fall, initial encounter: Secondary | ICD-10-CM | POA: Insufficient documentation

## 2013-09-18 DIAGNOSIS — Z853 Personal history of malignant neoplasm of breast: Secondary | ICD-10-CM | POA: Insufficient documentation

## 2013-09-18 DIAGNOSIS — S32009A Unspecified fracture of unspecified lumbar vertebra, initial encounter for closed fracture: Principal | ICD-10-CM | POA: Insufficient documentation

## 2013-09-18 DIAGNOSIS — I10 Essential (primary) hypertension: Secondary | ICD-10-CM | POA: Insufficient documentation

## 2013-09-18 DIAGNOSIS — IMO0002 Reserved for concepts with insufficient information to code with codable children: Secondary | ICD-10-CM | POA: Diagnosis present

## 2013-09-18 DIAGNOSIS — K219 Gastro-esophageal reflux disease without esophagitis: Secondary | ICD-10-CM | POA: Insufficient documentation

## 2013-09-18 DIAGNOSIS — E039 Hypothyroidism, unspecified: Secondary | ICD-10-CM | POA: Insufficient documentation

## 2013-09-18 DIAGNOSIS — Z01818 Encounter for other preprocedural examination: Secondary | ICD-10-CM | POA: Insufficient documentation

## 2013-09-18 DIAGNOSIS — G4700 Insomnia, unspecified: Secondary | ICD-10-CM | POA: Insufficient documentation

## 2013-09-18 DIAGNOSIS — Z01812 Encounter for preprocedural laboratory examination: Secondary | ICD-10-CM | POA: Insufficient documentation

## 2013-09-18 HISTORY — PX: KYPHOPLASTY: SHX5884

## 2013-09-18 LAB — ABO/RH: ABO/RH(D): O POS

## 2013-09-18 IMAGING — RF DG THORACOLUMBAR SPINE 2V
1 series · 1 of 1 positions shown · non-contrast
Comparison: Lumbar MRI, for/[DATE]

CLINICAL DATA: L2 and L3 kyphoplasty

EXAM:
DG C-ARM 1-60 MIN; THORACOLUMBAR SPINE - 2 VIEW
:

[Series 1: run · 1 of 1 slices shown]
[im 1/1]
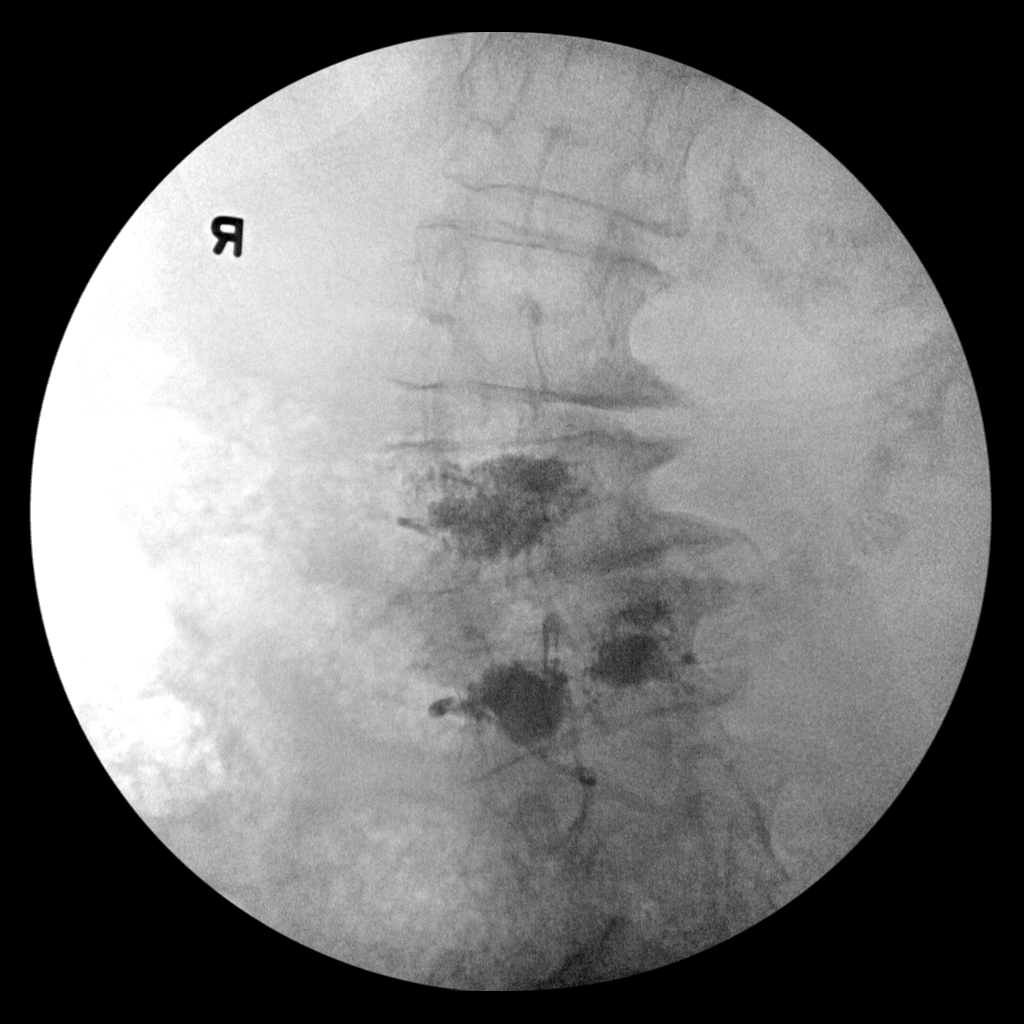

[1 of 1 positions shown; findings below may reference images not displayed]

FINDINGS: The submitted images show kyphoplasty cement within the L2 and L3
vertebral bodies stablizing the fractures. There is no evidence of
segment extrusion beyond the vertebral body confines or of an
operative complication.
IMPRESSION: L2-L3 kyphoplasty. Please refer to the procedure report for a
complete description.

## 2013-09-18 IMAGING — RF DG THORACOLUMBAR SPINE 2V
1 series · 1 of 1 positions shown · non-contrast
Comparison: Lumbar MRI, for/[DATE]

CLINICAL DATA: L2 and L3 kyphoplasty

EXAM:
DG C-ARM 1-60 MIN; THORACOLUMBAR SPINE - 2 VIEW
:

[Series 1: run · 1 of 1 slices shown]
[im 1/1]
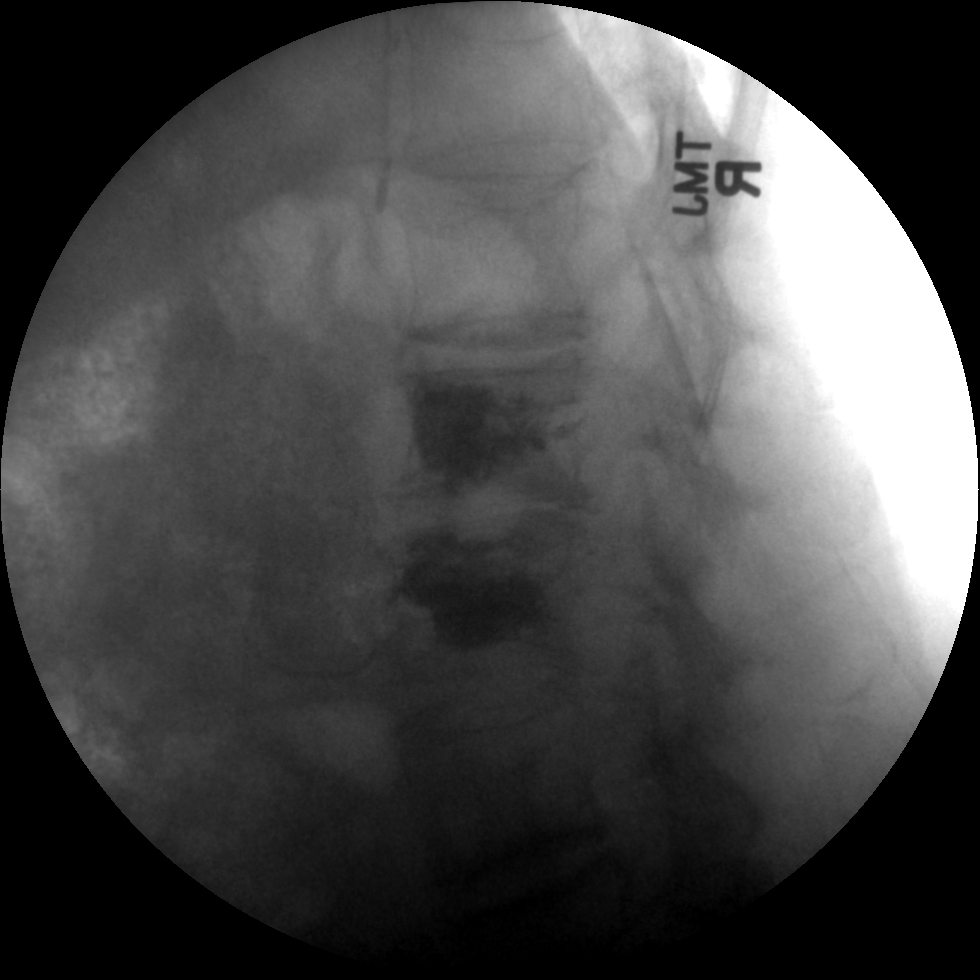

[1 of 1 positions shown; findings below may reference images not displayed]

FINDINGS: The submitted images show kyphoplasty cement within the L2 and L3
vertebral bodies stablizing the fractures. There is no evidence of
segment extrusion beyond the vertebral body confines or of an
operative complication.
IMPRESSION: L2-L3 kyphoplasty. Please refer to the procedure report for a
complete description.

## 2013-09-18 SURGERY — KYPHOPLASTY
Anesthesia: General | Site: Spine Lumbar

## 2013-09-18 MED ORDER — MORPHINE SULFATE 2 MG/ML IJ SOLN: 1.0000 mg | INTRAMUSCULAR | Status: DC | PRN

## 2013-09-18 MED ORDER — BUPIVACAINE-EPINEPHRINE 0.25% -1:200000 IJ SOLN
INTRAMUSCULAR | Status: DC | PRN
  Administered 2013-09-18: 7 mL

## 2013-09-18 MED ORDER — SENNOSIDES-DOCUSATE SODIUM 8.6-50 MG PO TABS
1.0000 | ORAL_TABLET | Freq: Every evening | ORAL | Status: DC | PRN
  Filled 2013-09-18: qty 1

## 2013-09-18 MED ORDER — FENTANYL CITRATE 0.05 MG/ML IJ SOLN
INTRAMUSCULAR | Status: AC
  Administered 2013-09-18: 25 ug via INTRAVENOUS
  Filled 2013-09-18: qty 2

## 2013-09-18 MED ORDER — DIAZEPAM 5 MG PO TABS: 5.0000 mg | ORAL_TABLET | Freq: Four times a day (QID) | ORAL | Status: DC | PRN

## 2013-09-18 MED ORDER — GLYCOPYRROLATE 0.2 MG/ML IJ SOLN
INTRAMUSCULAR | Status: AC
  Filled 2013-09-18: qty 1

## 2013-09-18 MED ORDER — POLYETHYLENE GLYCOL 3350 17 G PO PACK
17.0000 g | PACK | Freq: Every day | ORAL | Status: DC | PRN
  Filled 2013-09-18: qty 1

## 2013-09-18 MED ORDER — DOCUSATE SODIUM 100 MG PO CAPS
100.0000 mg | ORAL_CAPSULE | Freq: Two times a day (BID) | ORAL | Status: DC
  Administered 2013-09-18: 100 mg via ORAL
  Filled 2013-09-18 (×3): qty 1

## 2013-09-18 MED ORDER — PHENYLEPHRINE HCL 10 MG/ML IJ SOLN
INTRAMUSCULAR | Status: DC | PRN
  Administered 2013-09-18: 80 ug via INTRAVENOUS
  Administered 2013-09-18: 40 ug via INTRAVENOUS

## 2013-09-18 MED ORDER — FENTANYL CITRATE 0.05 MG/ML IJ SOLN
INTRAMUSCULAR | Status: AC
  Filled 2013-09-18: qty 5

## 2013-09-18 MED ORDER — ROCURONIUM BROMIDE 50 MG/5ML IV SOLN
INTRAVENOUS | Status: AC
  Filled 2013-09-18: qty 1

## 2013-09-18 MED ORDER — SODIUM CHLORIDE 0.9 % IJ SOLN: 3.0000 mL | INTRAMUSCULAR | Status: DC | PRN

## 2013-09-18 MED ORDER — CALCIUM CITRATE-VITAMIN D 500-400 MG-UNIT PO CHEW
1.0000 | CHEWABLE_TABLET | Freq: Every day | ORAL | Status: DC
  Filled 2013-09-18: qty 1

## 2013-09-18 MED ORDER — FENTANYL CITRATE 0.05 MG/ML IJ SOLN
25.0000 ug | INTRAMUSCULAR | Status: DC | PRN
  Administered 2013-09-18 (×2): 25 ug via INTRAVENOUS

## 2013-09-18 MED ORDER — LISINOPRIL 10 MG PO TABS
10.0000 mg | ORAL_TABLET | Freq: Every day | ORAL | Status: DC
  Filled 2013-09-18: qty 1

## 2013-09-18 MED ORDER — LACTATED RINGERS IV SOLN
INTRAVENOUS | Status: DC | PRN
  Administered 2013-09-18 (×2): via INTRAVENOUS

## 2013-09-18 MED ORDER — FENTANYL CITRATE 0.05 MG/ML IJ SOLN
INTRAMUSCULAR | Status: DC | PRN
  Administered 2013-09-18 (×2): 50 ug via INTRAVENOUS

## 2013-09-18 MED ORDER — PHENYLEPHRINE 40 MCG/ML (10ML) SYRINGE FOR IV PUSH (FOR BLOOD PRESSURE SUPPORT)
PREFILLED_SYRINGE | INTRAVENOUS | Status: AC
  Filled 2013-09-18: qty 10

## 2013-09-18 MED ORDER — NEOSTIGMINE METHYLSULFATE 1 MG/ML IJ SOLN
INTRAMUSCULAR | Status: DC | PRN
  Administered 2013-09-18: 3 mg via INTRAVENOUS

## 2013-09-18 MED ORDER — LEVOTHYROXINE SODIUM 125 MCG PO TABS
125.0000 ug | ORAL_TABLET | Freq: Every day | ORAL | Status: DC
  Administered 2013-09-19: 125 ug via ORAL
  Filled 2013-09-18 (×2): qty 1

## 2013-09-18 MED ORDER — ALUM & MAG HYDROXIDE-SIMETH 200-200-20 MG/5ML PO SUSP: 30.0000 mL | Freq: Four times a day (QID) | ORAL | Status: DC | PRN

## 2013-09-18 MED ORDER — FLEET ENEMA 7-19 GM/118ML RE ENEM
1.0000 | ENEMA | Freq: Once | RECTAL | Status: AC | PRN
  Filled 2013-09-18: qty 1

## 2013-09-18 MED ORDER — IOHEXOL 300 MG/ML  SOLN
INTRAMUSCULAR | Status: DC | PRN
  Administered 2013-09-18: 2 mL via INTRAVENOUS

## 2013-09-18 MED ORDER — BUPIVACAINE-EPINEPHRINE (PF) 0.25% -1:200000 IJ SOLN
INTRAMUSCULAR | Status: AC
  Filled 2013-09-18: qty 30

## 2013-09-18 MED ORDER — PROPOFOL 10 MG/ML IV BOLUS
INTRAVENOUS | Status: AC
  Filled 2013-09-18: qty 20

## 2013-09-18 MED ORDER — FAMOTIDINE 20 MG PO TABS
20.0000 mg | ORAL_TABLET | Freq: Two times a day (BID) | ORAL | Status: DC
  Administered 2013-09-18 (×2): 20 mg via ORAL
  Filled 2013-09-18 (×4): qty 1

## 2013-09-18 MED ORDER — ACETAMINOPHEN 325 MG PO TABS
650.0000 mg | ORAL_TABLET | ORAL | Status: DC | PRN
Start: 2013-09-18 — End: 2013-09-19

## 2013-09-18 MED ORDER — ONDANSETRON HCL 4 MG/2ML IJ SOLN
INTRAMUSCULAR | Status: AC
  Filled 2013-09-18: qty 2

## 2013-09-18 MED ORDER — PROPOFOL 10 MG/ML IV BOLUS
INTRAVENOUS | Status: DC | PRN
  Administered 2013-09-18: 130 mg via INTRAVENOUS

## 2013-09-18 MED ORDER — LIDOCAINE HCL (CARDIAC) 20 MG/ML IV SOLN
INTRAVENOUS | Status: DC | PRN
  Administered 2013-09-18: 80 mg via INTRAVENOUS

## 2013-09-18 MED ORDER — ONDANSETRON HCL 4 MG/2ML IJ SOLN: 4.0000 mg | Freq: Once | INTRAMUSCULAR | Status: DC | PRN

## 2013-09-18 MED ORDER — LIDOCAINE HCL (CARDIAC) 20 MG/ML IV SOLN
INTRAVENOUS | Status: AC
  Filled 2013-09-18: qty 5

## 2013-09-18 MED ORDER — SODIUM CHLORIDE 0.9 % IV SOLN: 250.0000 mL | INTRAVENOUS | Status: DC

## 2013-09-18 MED ORDER — CHOLECALCIFEROL 10 MCG (400 UNIT) PO TABS
400.0000 [IU] | ORAL_TABLET | Freq: Every day | ORAL | Status: DC
  Administered 2013-09-18: 400 [IU] via ORAL
  Filled 2013-09-18 (×2): qty 1

## 2013-09-18 MED ORDER — GLYCOPYRROLATE 0.2 MG/ML IJ SOLN
INTRAMUSCULAR | Status: DC | PRN
  Administered 2013-09-18: 0.4 mg via INTRAVENOUS

## 2013-09-18 MED ORDER — SUCCINYLCHOLINE CHLORIDE 20 MG/ML IJ SOLN
INTRAMUSCULAR | Status: AC
  Filled 2013-09-18: qty 1

## 2013-09-18 MED ORDER — METOPROLOL TARTRATE 100 MG PO TABS
100.0000 mg | ORAL_TABLET | Freq: Two times a day (BID) | ORAL | Status: DC
  Administered 2013-09-18: 100 mg via ORAL
  Filled 2013-09-18 (×3): qty 1

## 2013-09-18 MED ORDER — PROMETHAZINE HCL 25 MG PO TABS: 12.5000 mg | ORAL_TABLET | Freq: Four times a day (QID) | ORAL | Status: DC | PRN

## 2013-09-18 MED ORDER — SODIUM CHLORIDE 0.9 % IJ SOLN
INTRAMUSCULAR | Status: AC
  Filled 2013-09-18: qty 10

## 2013-09-18 MED ORDER — MENTHOL 3 MG MT LOZG: 1.0000 | LOZENGE | OROMUCOSAL | Status: DC | PRN

## 2013-09-18 MED ORDER — ROCURONIUM BROMIDE 100 MG/10ML IV SOLN
INTRAVENOUS | Status: DC | PRN
  Administered 2013-09-18: 30 mg via INTRAVENOUS

## 2013-09-18 MED ORDER — ADULT MULTIVITAMIN W/MINERALS CH
1.0000 | ORAL_TABLET | Freq: Every day | ORAL | Status: DC
  Administered 2013-09-18: 1 via ORAL
  Filled 2013-09-18 (×2): qty 1

## 2013-09-18 MED ORDER — ALBUTEROL SULFATE HFA 108 (90 BASE) MCG/ACT IN AERS: 2.0000 | INHALATION_SPRAY | Freq: Four times a day (QID) | RESPIRATORY_TRACT | Status: DC | PRN

## 2013-09-18 MED ORDER — CALCIUM CARBONATE-VITAMIN D 500-200 MG-UNIT PO TABS
1.0000 | ORAL_TABLET | Freq: Every day | ORAL | Status: DC
  Administered 2013-09-18: 1 via ORAL
  Filled 2013-09-18 (×3): qty 1

## 2013-09-18 MED ORDER — ONDANSETRON HCL 4 MG/2ML IJ SOLN: 4.0000 mg | INTRAMUSCULAR | Status: DC | PRN

## 2013-09-18 MED ORDER — FAMOTIDINE-CA CARB-MAG HYDROX 10-800-165 MG PO CHEW: 1.0000 | CHEWABLE_TABLET | Freq: Every day | ORAL | Status: DC | PRN

## 2013-09-18 MED ORDER — SODIUM CHLORIDE 0.9 % IJ SOLN
3.0000 mL | Freq: Two times a day (BID) | INTRAMUSCULAR | Status: DC
  Administered 2013-09-18: 3 mL via INTRAVENOUS

## 2013-09-18 MED ORDER — EPHEDRINE SULFATE 50 MG/ML IJ SOLN
INTRAMUSCULAR | Status: DC | PRN
  Administered 2013-09-18 (×2): 5 mg via INTRAVENOUS

## 2013-09-18 MED ORDER — PHENOL 1.4 % MT LIQD: 1.0000 | OROMUCOSAL | Status: DC | PRN

## 2013-09-18 MED ORDER — HYDROCODONE-ACETAMINOPHEN 5-325 MG PO TABS
1.0000 | ORAL_TABLET | Freq: Four times a day (QID) | ORAL | Status: DC | PRN
  Administered 2013-09-18 (×2): 1 via ORAL
  Filled 2013-09-18 (×2): qty 1

## 2013-09-18 MED ORDER — ONDANSETRON HCL 4 MG/2ML IJ SOLN
INTRAMUSCULAR | Status: DC | PRN
  Administered 2013-09-18: 4 mg via INTRAVENOUS

## 2013-09-18 MED ORDER — CEFAZOLIN SODIUM 1-5 GM-% IV SOLN
1.0000 g | Freq: Three times a day (TID) | INTRAVENOUS | Status: AC
  Administered 2013-09-18 (×2): 1 g via INTRAVENOUS
  Filled 2013-09-18 (×3): qty 50

## 2013-09-18 MED ORDER — ALBUTEROL SULFATE (2.5 MG/3ML) 0.083% IN NEBU: 2.5000 mg | INHALATION_SOLUTION | Freq: Four times a day (QID) | RESPIRATORY_TRACT | Status: DC | PRN

## 2013-09-18 MED ORDER — ARTIFICIAL TEARS OP OINT
TOPICAL_OINTMENT | OPHTHALMIC | Status: DC | PRN
  Administered 2013-09-18: 1 via OPHTHALMIC

## 2013-09-18 MED ORDER — ACETAMINOPHEN 650 MG RE SUPP: 650.0000 mg | RECTAL | Status: DC | PRN

## 2013-09-18 MED ORDER — MIRABEGRON ER 50 MG PO TB24
50.0000 mg | ORAL_TABLET | Freq: Every day | ORAL | Status: DC
  Administered 2013-09-18: 50 mg via ORAL
  Filled 2013-09-18 (×2): qty 1

## 2013-09-18 MED ORDER — ARTIFICIAL TEARS OP OINT
TOPICAL_OINTMENT | OPHTHALMIC | Status: AC
  Filled 2013-09-18: qty 3.5

## 2013-09-18 MED ORDER — EPHEDRINE SULFATE 50 MG/ML IJ SOLN
INTRAMUSCULAR | Status: AC
  Filled 2013-09-18: qty 1

## 2013-09-18 MED ORDER — BISACODYL 5 MG PO TBEC
5.0000 mg | DELAYED_RELEASE_TABLET | Freq: Every day | ORAL | Status: DC | PRN
  Filled 2013-09-18: qty 1

## 2013-09-18 SURGICAL SUPPLY — 44 items
ADH SKN CLS APL DERMABOND .7 (GAUZE/BANDAGES/DRESSINGS) ×1
BLADE SURG 15 STRL LF DISP TIS (BLADE) ×1 IMPLANT
BLADE SURG 15 STRL SS (BLADE) ×3
CEMENT BONE KYPHX HV R (Orthopedic Implant) ×4 IMPLANT
CEMENT KYPHON C01A KIT/MIXER (Cement) ×7 IMPLANT
COVER MAYO STAND STRL (DRAPES) ×3 IMPLANT
CURETTE WEDGE 8.5MM KYPHX (MISCELLANEOUS) IMPLANT
DERMABOND ADVANCED (GAUZE/BANDAGES/DRESSINGS) ×2
DERMABOND ADVANCED .7 DNX12 (GAUZE/BANDAGES/DRESSINGS) ×1 IMPLANT
DRAPE C-ARM 42X72 X-RAY (DRAPES) ×6 IMPLANT
DRAPE INCISE IOBAN 66X45 STRL (DRAPES) ×3 IMPLANT
DRAPE LAPAROTOMY T 102X78X121 (DRAPES) ×3 IMPLANT
DRAPE SURG 17X23 STRL (DRAPES) ×12 IMPLANT
DURAPREP 26ML APPLICATOR (WOUND CARE) ×3 IMPLANT
GAUZE SPONGE 4X4 16PLY XRAY LF (GAUZE/BANDAGES/DRESSINGS) ×3 IMPLANT
GLOVE BIO SURGEON STRL SZ7 (GLOVE) ×3 IMPLANT
GLOVE BIO SURGEON STRL SZ8 (GLOVE) ×3 IMPLANT
GLOVE BIOGEL PI IND STRL 7.0 (GLOVE) ×1 IMPLANT
GLOVE BIOGEL PI IND STRL 8 (GLOVE) ×1 IMPLANT
GLOVE BIOGEL PI INDICATOR 7.0 (GLOVE) ×2
GLOVE BIOGEL PI INDICATOR 8 (GLOVE) ×2
GOWN STRL REUS W/ TWL LRG LVL3 (GOWN DISPOSABLE) ×2 IMPLANT
GOWN STRL REUS W/ TWL XL LVL3 (GOWN DISPOSABLE) ×1 IMPLANT
GOWN STRL REUS W/TWL LRG LVL3 (GOWN DISPOSABLE) ×6
GOWN STRL REUS W/TWL XL LVL3 (GOWN DISPOSABLE) ×3
KIT BASIN OR (CUSTOM PROCEDURE TRAY) ×3 IMPLANT
KIT ROOM TURNOVER OR (KITS) ×3 IMPLANT
NDL HYPO 25X1 1.5 SAFETY (NEEDLE) ×1 IMPLANT
NDL SPNL 18GX3.5 QUINCKE PK (NEEDLE) ×2 IMPLANT
NEEDLE 22X1 1/2 (OR ONLY) (NEEDLE) ×3 IMPLANT
NEEDLE HYPO 25X1 1.5 SAFETY (NEEDLE) ×3 IMPLANT
NEEDLE SPNL 18GX3.5 QUINCKE PK (NEEDLE) ×6 IMPLANT
NS IRRIG 1000ML POUR BTL (IV SOLUTION) ×3 IMPLANT
PACK SURGICAL SETUP 50X90 (CUSTOM PROCEDURE TRAY) ×3 IMPLANT
PACK UNIVERSAL I (CUSTOM PROCEDURE TRAY) ×3 IMPLANT
PAD ARMBOARD 7.5X6 YLW CONV (MISCELLANEOUS) ×6 IMPLANT
POSITIONER HEAD PRONE TRACH (MISCELLANEOUS) ×3 IMPLANT
SUT MNCRL AB 4-0 PS2 18 (SUTURE) ×3 IMPLANT
SYR BULB IRRIGATION 50ML (SYRINGE) ×3 IMPLANT
SYR CONTROL 10ML LL (SYRINGE) ×3 IMPLANT
TOWEL OR 17X24 6PK STRL BLUE (TOWEL DISPOSABLE) ×3 IMPLANT
TOWEL OR 17X26 10 PK STRL BLUE (TOWEL DISPOSABLE) ×3 IMPLANT
TRAY KYPHOPAK 15/3 ONESTEP 1ST (MISCELLANEOUS) ×2 IMPLANT
WATER STERILE IRR 1000ML POUR (IV SOLUTION) ×3 IMPLANT

## 2013-09-18 NOTE — Transfer of Care (Signed)
Immediate Anesthesia Transfer of Care Note  Patient: Cassandra Ray  Procedure(s) Performed: Procedure(s) with comments: KYPHOPLASTY (N/A) - Lumbar 3 kyphoplasty  Patient Location: PACU  Anesthesia Type:General  Level of Consciousness: awake, oriented and patient cooperative  Airway & Oxygen Therapy: Patient Spontanous Breathing and Patient connected to nasal cannula oxygen  Post-op Assessment: Report given to PACU RN and Post -op Vital signs reviewed and stable  Post vital signs: Reviewed  Complications: No apparent anesthesia complications

## 2013-09-18 NOTE — Anesthesia Procedure Notes (Signed)
Procedure Name: Intubation Date/Time: 09/18/2013 7:33 AM Performed by: Jenne Campus Pre-anesthesia Checklist: Patient identified, Emergency Drugs available, Suction available, Patient being monitored and Timeout performed Patient Re-evaluated:Patient Re-evaluated prior to inductionOxygen Delivery Method: Circle system utilized Preoxygenation: Pre-oxygenation with 100% oxygen Intubation Type: IV induction Ventilation: Mask ventilation without difficulty Laryngoscope Size: 2 Grade View: Grade I Tube type: Oral Tube size: 7.0 mm Number of attempts: 1 Airway Equipment and Method: Stylet Placement Confirmation: ETT inserted through vocal cords under direct vision,  positive ETCO2 and breath sounds checked- equal and bilateral Secured at: 21 cm Tube secured with: Tape Dental Injury: Teeth and Oropharynx as per pre-operative assessment

## 2013-09-18 NOTE — Anesthesia Postprocedure Evaluation (Signed)
Anesthesia Post Note  Patient: Cassandra Ray  Procedure(s) Performed: Procedure(s) (LRB): KYPHOPLASTY (N/A)  Anesthesia type: General  Patient location: PACU  Post pain: Pain level controlled and Adequate analgesia  Post assessment: Post-op Vital signs reviewed, Patient's Cardiovascular Status Stable, Respiratory Function Stable, Patent Airway and Pain level controlled  Last Vitals:  Filed Vitals:   09/18/13 0945  BP: 184/93  Pulse: 77  Temp:   Resp: 15    Post vital signs: Reviewed and stable  Level of consciousness: awake, alert  and oriented  Complications: No apparent anesthesia complications

## 2013-09-18 NOTE — Anesthesia Preprocedure Evaluation (Addendum)
Anesthesia Evaluation  Patient identified by MRN, date of birth, ID band Patient awake    Reviewed: Allergy & Precautions, H&P , NPO status , Patient's Chart, lab work & pertinent test results, reviewed documented beta blocker date and time   History of Anesthesia Complications Negative for: history of anesthetic complications  Airway Mallampati: III TM Distance: >3 FB Neck ROM: full    Dental  (+) Teeth Intact, Dental Advisory Given   Pulmonary neg pulmonary ROS,  breath sounds clear to auscultation        Cardiovascular hypertension, Pt. on medications and Pt. on home beta blockers     Neuro/Psych negative neurological ROS  negative psych ROS   GI/Hepatic Neg liver ROS, GERD-  ,  Endo/Other  Hypothyroidism   Renal/GU negative Renal ROS     Musculoskeletal   Abdominal   Peds  Hematology   Anesthesia Other Findings   Reproductive/Obstetrics negative OB ROS                          Anesthesia Physical Anesthesia Plan  ASA: II  Anesthesia Plan: General   Post-op Pain Management:    Induction: Intravenous  Airway Management Planned: Oral ETT  Additional Equipment:   Intra-op Plan:   Post-operative Plan: Extubation in OR  Informed Consent: I have reviewed the patients History and Physical, chart, labs and discussed the procedure including the risks, benefits and alternatives for the proposed anesthesia with the patient or authorized representative who has indicated his/her understanding and acceptance.     Plan Discussed with: CRNA, Anesthesiologist and Surgeon  Anesthesia Plan Comments:         Anesthesia Quick Evaluation

## 2013-09-19 NOTE — Op Note (Signed)
Cassandra Ray, Cassandra Ray NO.:  [id number]  MEDICAL RECORD NO.:  [id number]  LOCATION:  [id number]                        FACILITY:  [place]  PHYSICIAN:  Phylliss Bob, MD      DATE OF BIRTH:  [birth year]  DATE OF PROCEDURE:  09/18/2013                              OPERATIVE REPORT   PREOPERATIVE DIAGNOSES: 1. L2, L3 compression fracture. 2. L2 compression fracture.  POSTOPERATIVE DIAGNOSES: 1. L2, L3 compression fracture. 2. L2 compression fracture.  PROCEDURES:  Kyphoplasty L2, L3.  SURGEON:  Phylliss Bob, MD  ASSISTANT:  None.  ANESTHESIA:  General endotracheal anesthesia.  COMPLICATIONS:  None.  DISPOSITION:  Stable.  ESTIMATED BLOOD LOSS:  Minimal.  INDICATIONS FOR PROCEDURE:  Briefly, [name] is a very pleasant 80- year-old female who did present to me with severe and constant pain in the low back.  The patient does have a history of chronic low back pain, however, she did sustain a fall approximately 3 weeks prior to being evaluated by me.  An MRI did reveal edema in the vertebral bodies of L2 and L3.  The patient was not tolerating narcotic medications well, we did elect to proceed with the kyphoplasty procedure.  The patient and her husband were fully aware of the risks associated with surgery.  The patient did elect to proceed.  OPERATIVE DETAILS:  On September 18, 2013, the patient brought to surgery and general endotracheal anesthesia was administered.  The patient was placed prone on a well-padded flat Jackson bed.  Gell rolls were placement over the patient's chest and hips.  Of note, per the patient's anatomy, there was substantial rotation of both the L3 and L2 vertebral bodies.  The bed was appropriately positioned to accommodate for the rotation.  The back was then prepped and draped in usual sterile fashion and a time-out procedure was performed.  AP and lateral fluoroscopy was placed into the appropriate position.  I then marked out the  L2 and L3 vertebral bodies and pedicles.  I then used Jamshidi needles to cannulate the L3 pedicles bilaterally.  I did also cannulate the L2 pedicle on the right side.  Kyphoplasty balloons were inflated, and it did restore endplate height partially at the L3 level.  The cement was mixed.  Starting at the L3 level, I did introduce approximately 3 mL of cement per cannula for a total of approximately 6 mL of cement.  I did note excellent interdigitation of the cement into the vertebral body. There was a small area of, what appeared to be, extravasation into a small vessel at the lateral aspect of the vertebral body.  This did not progress.  I then allowed the cement to harden and removed the cannulas. I then injected approximately 3 mL of cement into the L2 vertebral body through the right cannula.  Again, I did note excellent interdigitation of the cement.  There was no and worrisome extravasation of the cement anteriorly, laterally, or posteriorly and towards intervertebral canal. The cannulas were then removed.  The 3 stab incisions were irrigated and closed with 3-0 Monocryl.  Band-aids were then placed.  I was very pleased with the final AP and lateral fluoroscopic images.  The patient was then awoken from general endotracheal anesthesia and transferred to recovery in stable condition.     Phylliss Bob, MD     MD/MEDQ  D:  09/18/2013  T:  09/19/2013  Job:  [id number]

## 2013-09-19 NOTE — Progress Notes (Signed)
Pt and husband given D/C instructions with verbal understanding of teaching. All questions were answered prior to D/C. Pt's IV was removed prior to D/C. Pt D/C'd home via wheelchair with her husband @ 1000 per MD order. Pt is stable @ D/C and has no other needs. Holli Humbles, RN

## 2013-09-19 NOTE — Progress Notes (Signed)
PO Day 1 L2/L3 Kyphoplasty doing very well, marked improvement in pain, has been up and walking, sparse use of pain medicine. Only complaint is dry mouth.  BP 136/84  Pulse 67  Temp(Src) 97.7 F (36.5 C) (Oral)  Resp 16  SpO2 98%  Pt laying comfortably in hospital bed, dressing CDI, SCD's in place, NVI  PO Day 1 L2/L3 Kyphoplasty doing extremely well  D/C home to [facility] today    -F/U office 2 weeks   -Pt has medications at home to use as needed   -Activity as pain allows

## 2013-09-19 NOTE — Discharge Summary (Signed)
Patient ID: Cassandra Ray MRN: [id number] DOB/AGE: 06-Mar-1923 80 y.o.  Admit date: 09/18/2013 Discharge date: 09/19/2013  Admission Diagnoses:  Active Problems:   Compression fracture   Discharge Diagnoses:  Same  Past Medical History  Diagnosis Date  . HTN (hypertension)   . Hypothyroidism   . Insomnia   . Actinic keratoses   . Atrial fibrillation     "only one time"  . Arthritis     back   . Cancer     Breast cancer (right)  . Compression fracture of L3 lumbar vertebra   . GERD (gastroesophageal reflux disease)     takes otc Pepcid as needed    Surgeries: Procedure(s): KYPHOPLASTY L2 and L3 on 09/18/2013   Consultants:  none  Discharged Condition: Improved  Hospital Course: Cassandra Ray is an 80 y.o. female who was admitted 09/18/2013 for operative treatment of compression fracture. Patient has severe unremitting pain that affects sleep, daily activities, and work/hobbies. After pre-op clearance the patient was taken to the operating room on 09/18/2013 and underwent  Procedure(s): KYPHOPLASTY L2 and L3.    Patient was given perioperative antibiotics: Anti-infectives   Start     Dose/Rate Route Frequency Ordered Stop   09/18/13 1530  ceFAZolin (ANCEF) IVPB 1 g/50 mL premix     1 g 100 mL/hr over 30 Minutes Intravenous Every 8 hours 09/18/13 1213 09/18/13 2254   09/18/13 0600  ceFAZolin (ANCEF) IVPB 2 g/50 mL premix     2 g 100 mL/hr over 30 Minutes Intravenous On call to O.R. 09/17/13 1354 09/18/13 0740       Patient was given sequential compression devices, early ambulation to prevent DVT.  Patient benefited maximally from hospital stay and there were no complications.    Recent vital signs: Patient Vitals for the past 24 hrs:  BP Temp Temp src Pulse Resp SpO2  09/19/13 0747 136/84 mmHg 97.7 F (36.5 C) - 67 16 98 %  09/19/13 0447 151/67 mmHg 97.7 F (36.5 C) Oral 69 17 96 %  09/19/13 0051 156/83 mmHg 98.4 F (36.9 C) Oral 83 18 93 %  09/18/13 2057  137/81 mmHg 97.3 F (36.3 C) Oral 79 17 95 %  09/18/13 1550 164/104 mmHg 97.4 F (36.3 C) Oral 61 18 96 %  09/18/13 1209 175/101 mmHg 97.2 F (36.2 C) - 66 18 94 %  09/18/13 1100 200/109 mmHg 97.2 F (36.2 C) - 67 18 91 %  09/18/13 1045 186/97 mmHg - - 76 10 98 %  09/18/13 1030 199/103 mmHg - - 77 16 98 %  09/18/13 1015 176/103 mmHg - - 74 14 99 %  09/18/13 1000 186/90 mmHg - - 76 22 100 %  09/18/13 0945 184/93 mmHg - - 77 15 97 %  09/18/13 0938 203/91 mmHg 97.6 F (36.4 C) - 81 15 98 %     Recent laboratory studies:  Recent Labs  09/16/13 1210 09/16/13 1340 09/17/13 1438  WBC  --  7.7  --   HGB  --  13.7  --   HCT  --  40.1  --   PLT  --  145*  --   NA 131*  --   --   K 4.8  --   --   CL 91*  --   --   CO2 25  --   --   BUN 21  --   --   CREATININE 0.81  --   --   GLUCOSE 74  --   --  INR  --   --  0.99  CALCIUM 9.6  --   --      Discharge Medications:     Medication List         albuterol 108 (90 BASE) MCG/ACT inhaler  Commonly known as:  PROVENTIL HFA;VENTOLIN HFA  Inhale 2 puffs into the lungs every 6 (six) hours as needed for wheezing or shortness of breath.     calcium citrate-vitamin D 315-200 MG-UNIT per tablet  Commonly known as:  CITRACAL+D  Take 1 tablet by mouth daily.     cholecalciferol 400 UNITS Tabs tablet  Commonly known as:  VITAMIN D  Take 400 Units by mouth daily.     diazepam 5 MG tablet  Commonly known as:  VALIUM  Take 5 mg by mouth every 6 (six) hours as needed for anxiety or muscle spasms.     famotidine-calcium carbonate-magnesium hydroxide 10-800-165 MG Chew chewable tablet  Commonly known as:  PEPCID COMPLETE  Chew 1 tablet by mouth daily as needed (indegestion).     HYDROcodone-acetaminophen 5-325 MG per tablet  Commonly known as:  NORCO/VICODIN  Take 1 tablet by mouth every 6 (six) hours as needed for moderate pain.     levothyroxine 125 MCG tablet  Commonly known as:  SYNTHROID, LEVOTHROID  Take 125 mcg by mouth  daily before breakfast.     lisinopril 10 MG tablet  Commonly known as:  PRINIVIL,ZESTRIL  Take 10 mg by mouth daily.     metoprolol 100 MG tablet  Commonly known as:  LOPRESSOR  Take 100 mg by mouth 2 (two) times daily.     multivitamin tablet  Take 1 tablet by mouth daily.     MYRBETRIQ 50 MG Tb24 tablet  Generic drug:  mirabegron ER  Take 50 mg by mouth daily.     polyethylene glycol packet  Commonly known as:  MIRALAX / GLYCOLAX  Take 17 g by mouth daily as needed for mild constipation.     promethazine 25 MG tablet  Commonly known as:  PHENERGAN  Take 12.5-25 mg by mouth every 6 (six) hours as needed for nausea or vomiting. Take 1/2-1 tablet as needed for nausea     TYLENOL PO  Take 2 tablets by mouth every 6 (six) hours as needed (pain).        Diagnostic Studies: Dg Chest 2 View  09/17/2013   CLINICAL DATA:  Preop  EXAM: CHEST  2 VIEW  COMPARISON:  DG CHEST 2V dated 04/07/2012; DG CHEST 2 VIEW dated 12/16/2009  FINDINGS: Chronic elevation of the right hemidiaphragm. There is diffuse prominence of interstitial markings and areas of central bronchiectasis. Regions of peribronchial thickening are appreciated. These findings are unchanged from prior study dated 04/07/2012. No new focal regions of consolidation or new focal infiltrates. Postsurgical changes within the right breast and right axilla. There is dextroscoliosis of the thoracolumbar spine with rotatory component. Degenerative changes appreciated within the shoulders.  IMPRESSION: Chronic interstitial changes without acute cardiopulmonary disease.   Electronically Signed   By: Margaree Mackintosh M.D.   On: 09/17/2013 16:00   Dg Thoracolumbar Spine  09/18/2013   CLINICAL DATA:  L2 and L3 kyphoplasty  EXAM: DG C-ARM 1-60 MIN; THORACOLUMBAR SPINE - 2 VIEW  : COMPARISON:  Lumbar MRI, for/12/15  FINDINGS: The submitted images show kyphoplasty cement within the L2 and L3 vertebral bodies stablizing the fractures. There is no  evidence of segment extrusion beyond the vertebral body confines or of an operative  complication.  IMPRESSION: L2-L3 kyphoplasty. Please refer to the procedure report for a complete description.   Electronically Signed   By: Lajean Manes M.D.   On: 09/18/2013 13:26   Dg C-arm 1-60 Min  09/18/2013   CLINICAL DATA:  L2 and L3 kyphoplasty  EXAM: DG C-ARM 1-60 MIN; THORACOLUMBAR SPINE - 2 VIEW  : COMPARISON:  Lumbar MRI, for/12/15  FINDINGS: The submitted images show kyphoplasty cement within the L2 and L3 vertebral bodies stablizing the fractures. There is no evidence of segment extrusion beyond the vertebral body confines or of an operative complication.  IMPRESSION: L2-L3 kyphoplasty. Please refer to the procedure report for a complete description.   Electronically Signed   By: Lajean Manes M.D.   On: 09/18/2013 13:26    Disposition: Home, [facility]   PO Day 1 L2/L3 Kyphoplasty doing extremely well  D/C home to [facility] today  -F/U office 2 weeks  -Pt has medications at home to use as needed  -Activity as pain allows    Signed: Justice Britain 09/19/2013, 8:13 AM

## 2013-09-22 ENCOUNTER — Encounter (HOSPITAL_COMMUNITY): Payer: Self-pay | Admitting: Orthopedic Surgery

## 2013-12-04 IMAGING — US US ABDOMEN COMPLETE
1 series · 13 of 25 positions shown · non-contrast
Comparison: No previous studies are available for review.

CLINICAL DATA: Reported history of pancreatic cyst demonstrated on
previous outside ultrasound

EXAM:
ULTRASOUND ABDOMEN COMPLETE

[Series 1: us abdomen complete · 0.14mm/px · 13 of 93 slices shown]
[im 1/93]
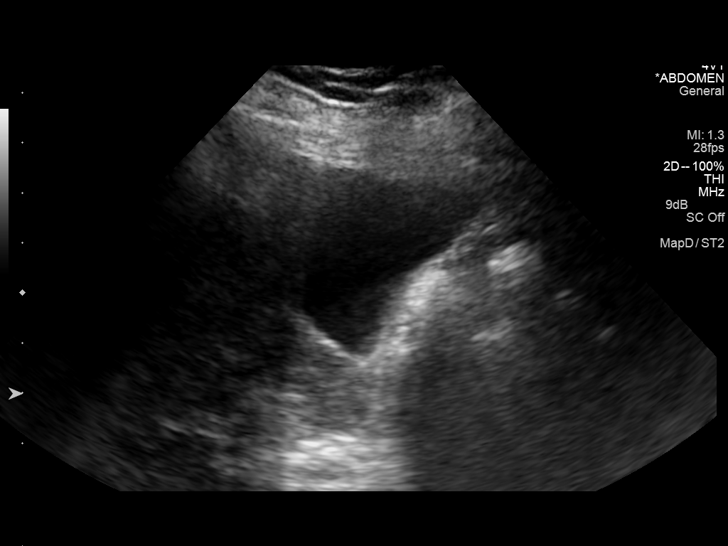
[im 8/93]
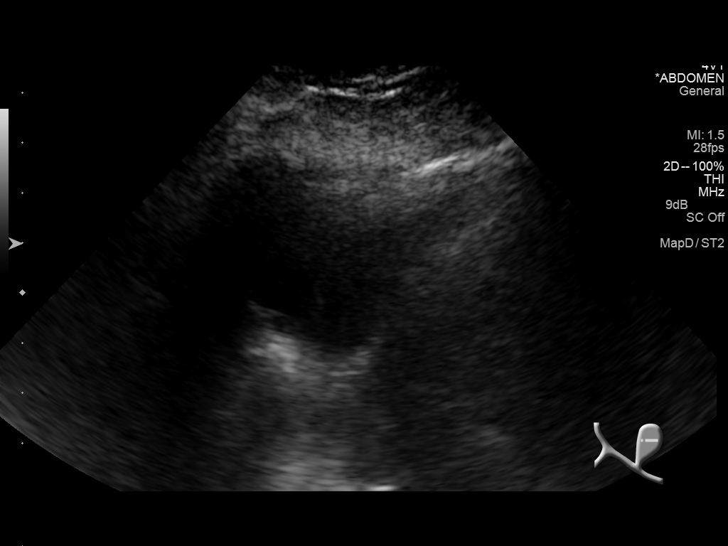
[im 16/93]
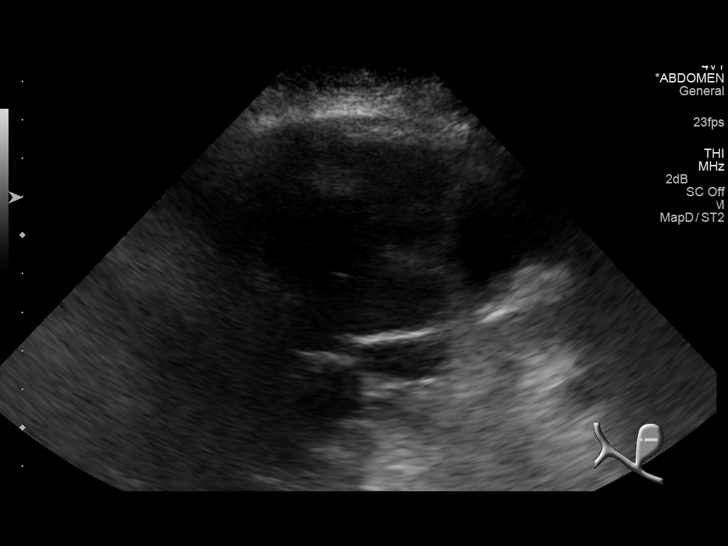
[im 24/93]
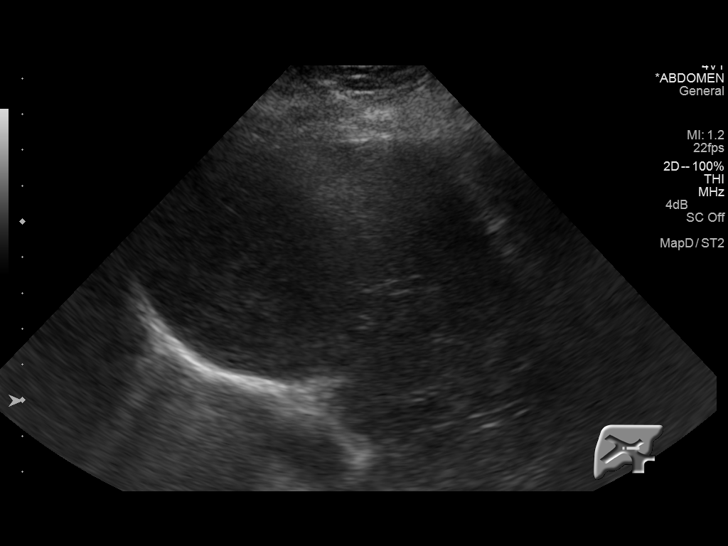
[im 31/93]
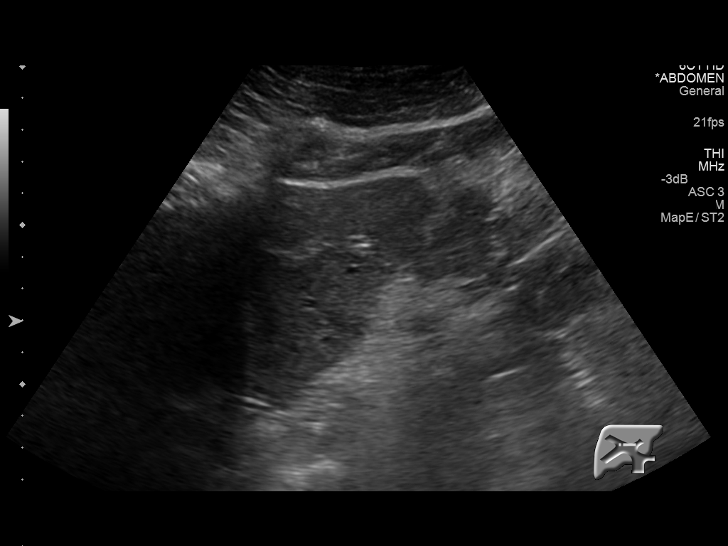
[im 39/93]
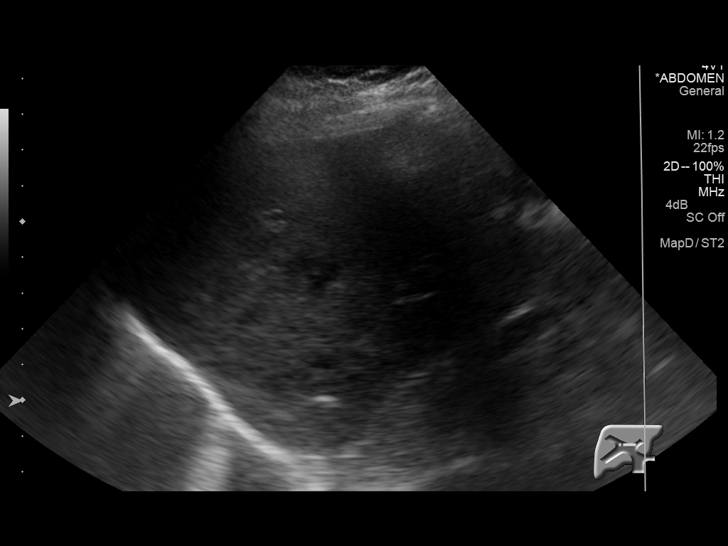
[im 47/93]
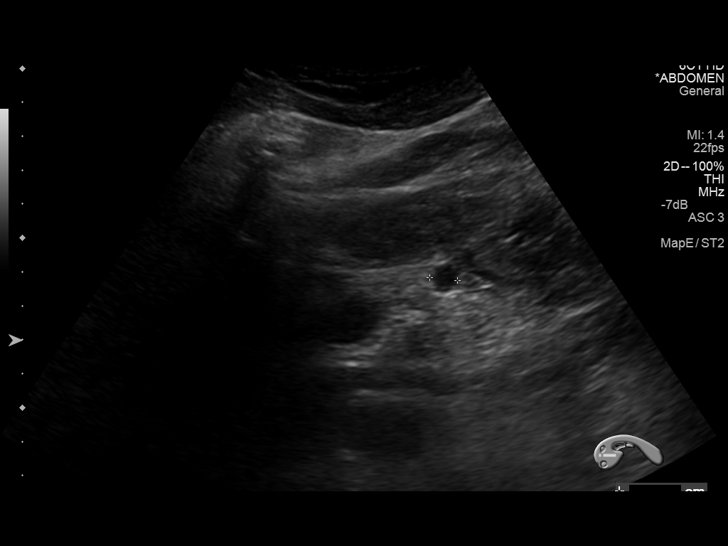
[im 54/93]
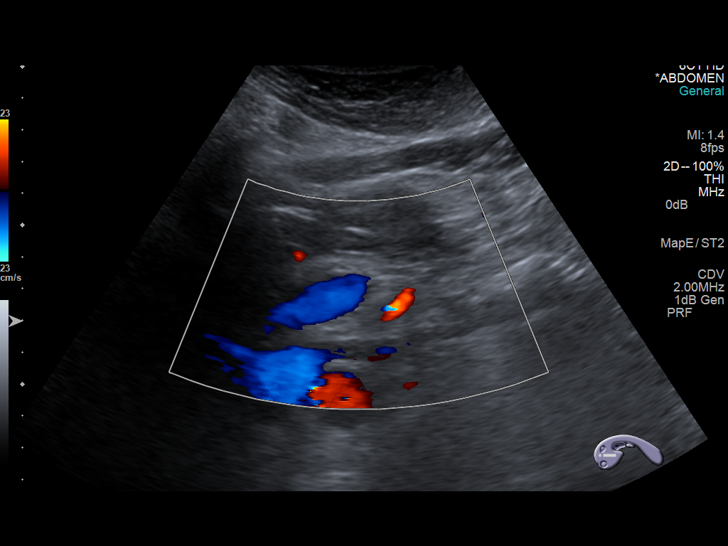
[im 62/93]
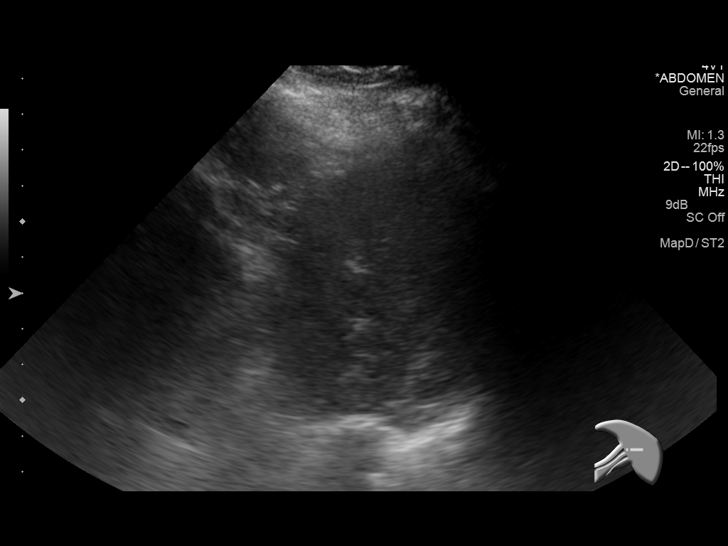
[im 70/93]
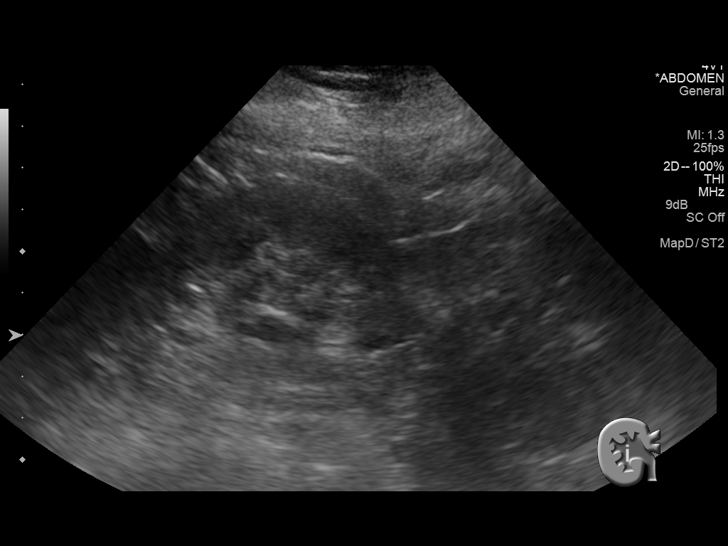
[im 77/93]
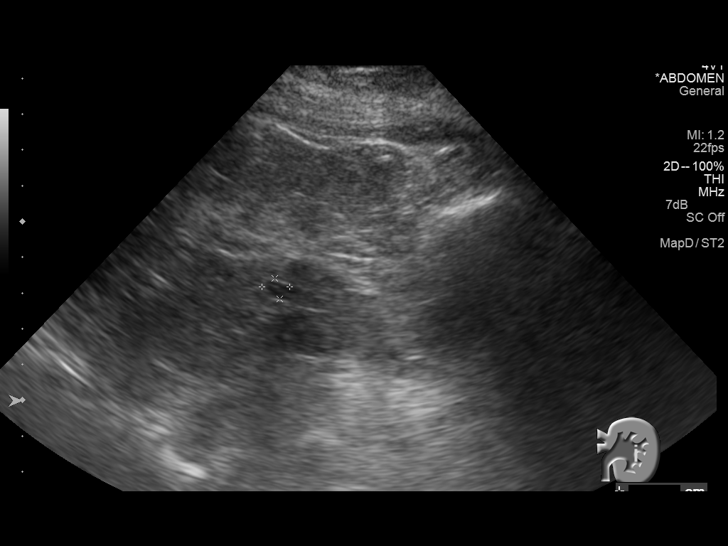
[im 85/93]
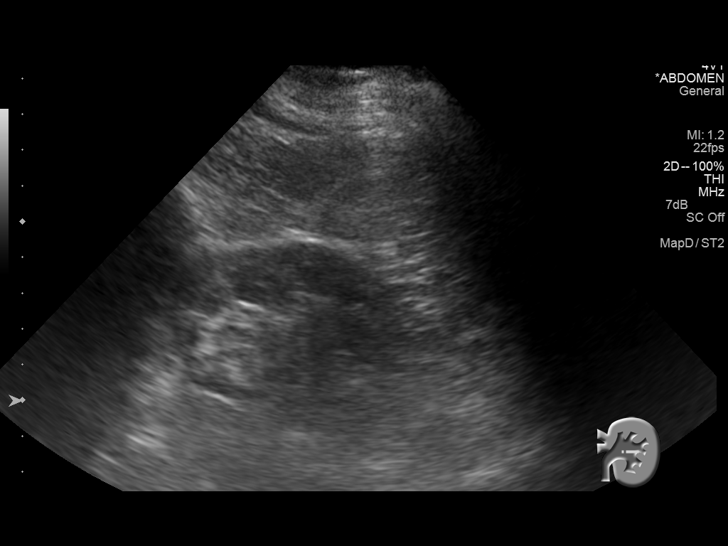
[im 93/93]
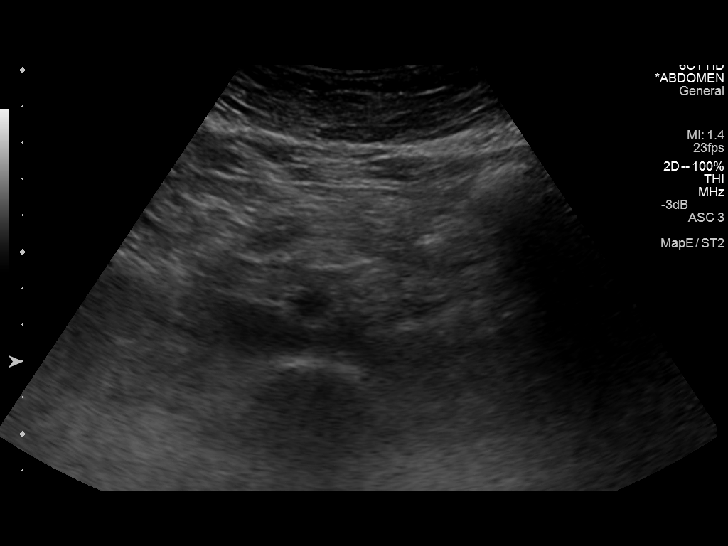

[13 of 25 positions shown; findings below may reference images not displayed]

FINDINGS: Gallbladder: Evaluation of the gallbladder is limited due to the
patient's body habitus and bowel gas. No definite gallstones are
observed. There is no positive sonographic Murphy's sign.

Common bile duct: Diameter: 4.9 mm.

Liver: The liver exhibits heterogeneous echotexture. No discrete
mass is observed. There is no intrahepatic ductal dilation.

IVC: No abnormality visualized.

Pancreas: There is a 1 x 0.9 x 0.8 cm cyst in the body of the
pancreas. The visualized portions of the pancreatic head and tail
are unremarkable. The pancreatic duct measures 4.8 mm in diameter.

Spleen: Size and appearance within normal limits.

Right Kidney: Length: 9.9 cm. Echogenicity within normal limits. No
mass or hydronephrosis visualized.

Left Kidney: Length: 10.4 cm. There is an 8 mm upper pole cortical
cyst. No hydronephrosis visualized.

Abdominal aorta: No aneurysm visualized.

Other findings: There is no ascites.
IMPRESSION: 1. The study is limited due to the patient's body habitus and bowel
gas.
2. No definite gallstones are observed but the gallbladder could not
be fully evaluated.
3. Heterogeneous hepatic echotexture without focal mass.
4. There is a cyst measuring 1 cm in greatest dimension in the body
of the pancreas. Evaluation of the remainder the pancreatic
parenchyma is grossly normal but technically limited.
5. Subcentimeter left upper pole cortical cyst.
6. Pancreatic MRI or CT scanning would be useful to more completely
evaluate the pancreas.

## 2014-04-08 ENCOUNTER — Other Ambulatory Visit: Payer: Self-pay

## 2014-04-08 DIAGNOSIS — Z1231 Encounter for screening mammogram for malignant neoplasm of breast: Secondary | ICD-10-CM

## 2014-04-24 ENCOUNTER — Inpatient Hospital Stay (HOSPITAL_COMMUNITY)
Admission: EM | Admit: 2014-04-24 | Discharge: 2014-04-26 | DRG: 292 | Disposition: A | Discharge status: Home / Self Care | Payer: Medicare Other | Attending: Internal Medicine | Admitting: Internal Medicine

## 2014-04-24 ENCOUNTER — Emergency Department (HOSPITAL_COMMUNITY): Payer: Medicare Other

## 2014-04-24 ENCOUNTER — Encounter (HOSPITAL_COMMUNITY): Payer: Self-pay | Admitting: *Deleted

## 2014-04-24 DIAGNOSIS — E871 Hypo-osmolality and hyponatremia: Secondary | ICD-10-CM

## 2014-04-24 DIAGNOSIS — J810 Acute pulmonary edema: Secondary | ICD-10-CM

## 2014-04-24 DIAGNOSIS — G4700 Insomnia, unspecified: Secondary | ICD-10-CM | POA: Diagnosis present

## 2014-04-24 DIAGNOSIS — I4891 Unspecified atrial fibrillation: Secondary | ICD-10-CM | POA: Diagnosis present

## 2014-04-24 DIAGNOSIS — I5033 Acute on chronic diastolic (congestive) heart failure: Secondary | ICD-10-CM | POA: Diagnosis not present

## 2014-04-24 DIAGNOSIS — K219 Gastro-esophageal reflux disease without esophagitis: Secondary | ICD-10-CM | POA: Diagnosis present

## 2014-04-24 DIAGNOSIS — I160 Hypertensive urgency: Secondary | ICD-10-CM | POA: Diagnosis present

## 2014-04-24 DIAGNOSIS — M1990 Unspecified osteoarthritis, unspecified site: Secondary | ICD-10-CM | POA: Diagnosis present

## 2014-04-24 DIAGNOSIS — I10 Essential (primary) hypertension: Secondary | ICD-10-CM

## 2014-04-24 DIAGNOSIS — I517 Cardiomegaly: Secondary | ICD-10-CM | POA: Diagnosis present

## 2014-04-24 DIAGNOSIS — Z66 Do not resuscitate: Secondary | ICD-10-CM | POA: Diagnosis present

## 2014-04-24 DIAGNOSIS — I5031 Acute diastolic (congestive) heart failure: Secondary | ICD-10-CM

## 2014-04-24 DIAGNOSIS — L570 Actinic keratosis: Secondary | ICD-10-CM | POA: Diagnosis present

## 2014-04-24 DIAGNOSIS — F1010 Alcohol abuse, uncomplicated: Secondary | ICD-10-CM | POA: Diagnosis present

## 2014-04-24 DIAGNOSIS — R0602 Shortness of breath: Secondary | ICD-10-CM | POA: Diagnosis present

## 2014-04-24 DIAGNOSIS — E039 Hypothyroidism, unspecified: Secondary | ICD-10-CM | POA: Diagnosis present

## 2014-04-24 DIAGNOSIS — Z853 Personal history of malignant neoplasm of breast: Secondary | ICD-10-CM | POA: Diagnosis not present

## 2014-04-24 LAB — CBC
HEMATOCRIT: 36.8 % (ref 36.0–46.0)
Hemoglobin: 12.6 g/dL (ref 12.0–15.0)
MCH: 31.7 pg (ref 26.0–34.0)
MCHC: 34.2 g/dL (ref 30.0–36.0)
MCV: 92.7 fL (ref 78.0–100.0)
PLATELETS: 196 10*3/uL (ref 150–400)
RBC: 3.97 MIL/uL (ref 3.87–5.11)
RDW: 12.8 % (ref 11.5–15.5)
WBC: 6.8 10*3/uL (ref 4.0–10.5)

## 2014-04-24 LAB — COMPREHENSIVE METABOLIC PANEL
ALBUMIN: 3.4 g/dL — AB (ref 3.5–5.2)
ALK PHOS: 76 U/L (ref 39–117)
ALT: 10 U/L (ref 0–35)
ANION GAP: 10 (ref 5–15)
AST: 22 U/L (ref 0–37)
BILIRUBIN TOTAL: 0.3 mg/dL (ref 0.3–1.2)
BUN: 17 mg/dL (ref 6–23)
CHLORIDE: 84 meq/L — AB (ref 96–112)
CO2: 27 mEq/L (ref 19–32)
CREATININE: 0.89 mg/dL (ref 0.50–1.10)
Calcium: 9.1 mg/dL (ref 8.4–10.5)
GFR calc Af Amer: 65 mL/min — ABNORMAL LOW (ref >90)
GFR calc non Af Amer: 56 mL/min — ABNORMAL LOW (ref >90)
Glucose, Bld: 103 mg/dL — ABNORMAL HIGH (ref 70–99)
POTASSIUM: 3.9 meq/L (ref 3.7–5.3)
Sodium: 121 mEq/L — CL (ref 137–147)
TOTAL PROTEIN: 7 g/dL (ref 6.0–8.3)

## 2014-04-24 LAB — URINALYSIS, ROUTINE W REFLEX MICROSCOPIC
Bilirubin Urine: NEGATIVE
Glucose, UA: NEGATIVE mg/dL
Hgb urine dipstick: NEGATIVE
Ketones, ur: NEGATIVE mg/dL
Leukocytes, UA: NEGATIVE
Nitrite: NEGATIVE
Protein, ur: NEGATIVE mg/dL
SPECIFIC GRAVITY, URINE: 1.009 (ref 1.005–1.030)
Urobilinogen, UA: 0.2 mg/dL (ref 0.0–1.0)
pH: 7.5 (ref 5.0–8.0)

## 2014-04-24 LAB — PRO B NATRIURETIC PEPTIDE: PRO B NATRI PEPTIDE: 824.3 pg/mL — AB (ref 0–450)

## 2014-04-24 LAB — TROPONIN I: Troponin I: <0.3 ng/mL (ref <0.30)

## 2014-04-24 IMAGING — CR DG CHEST 2V
2 series · 2 of 2 positions shown · non-contrast
Comparison: [DATE]

CLINICAL DATA: Wheezing and crackles in the lower lobes. Shortness
of breath.

EXAM:
CHEST  2 VIEW

[w chest lat]
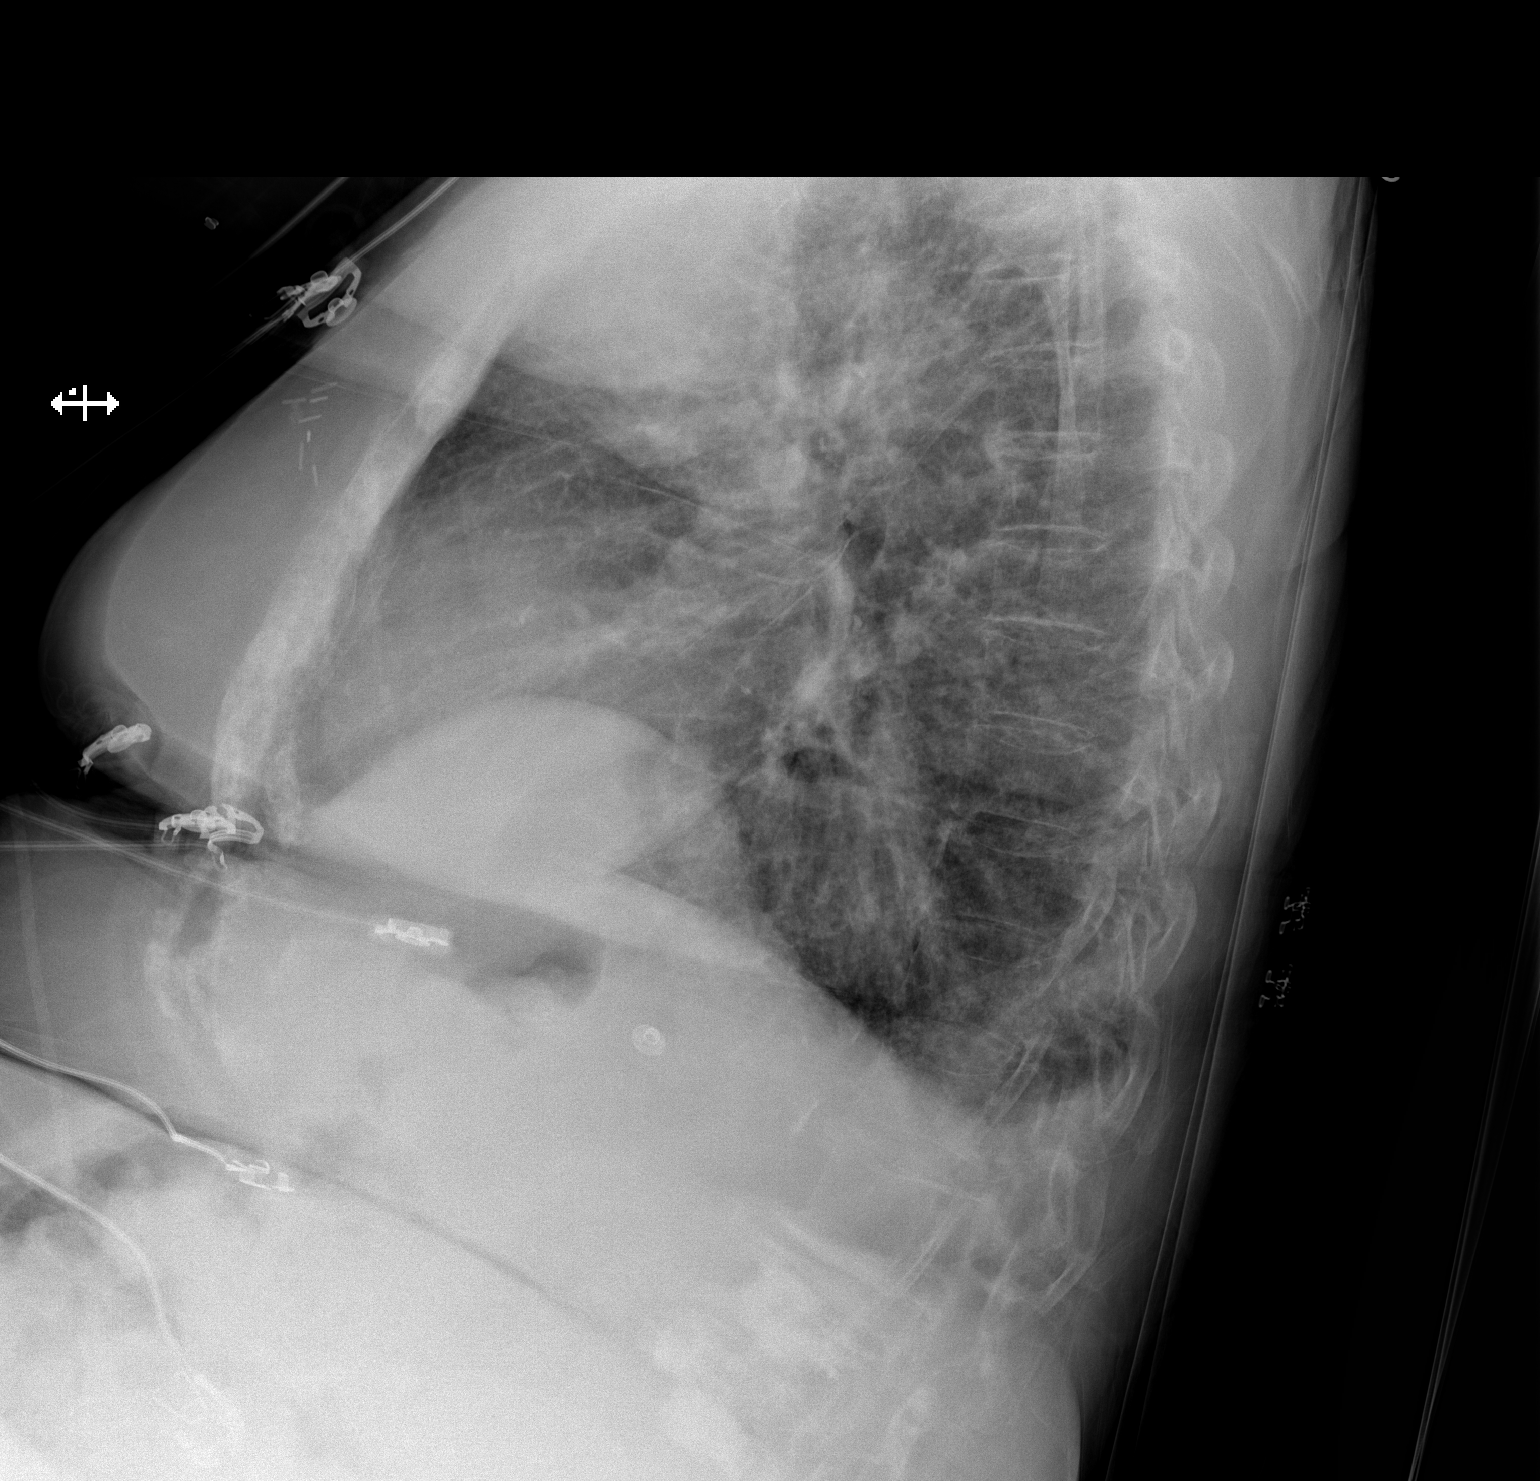

[x chest ap]
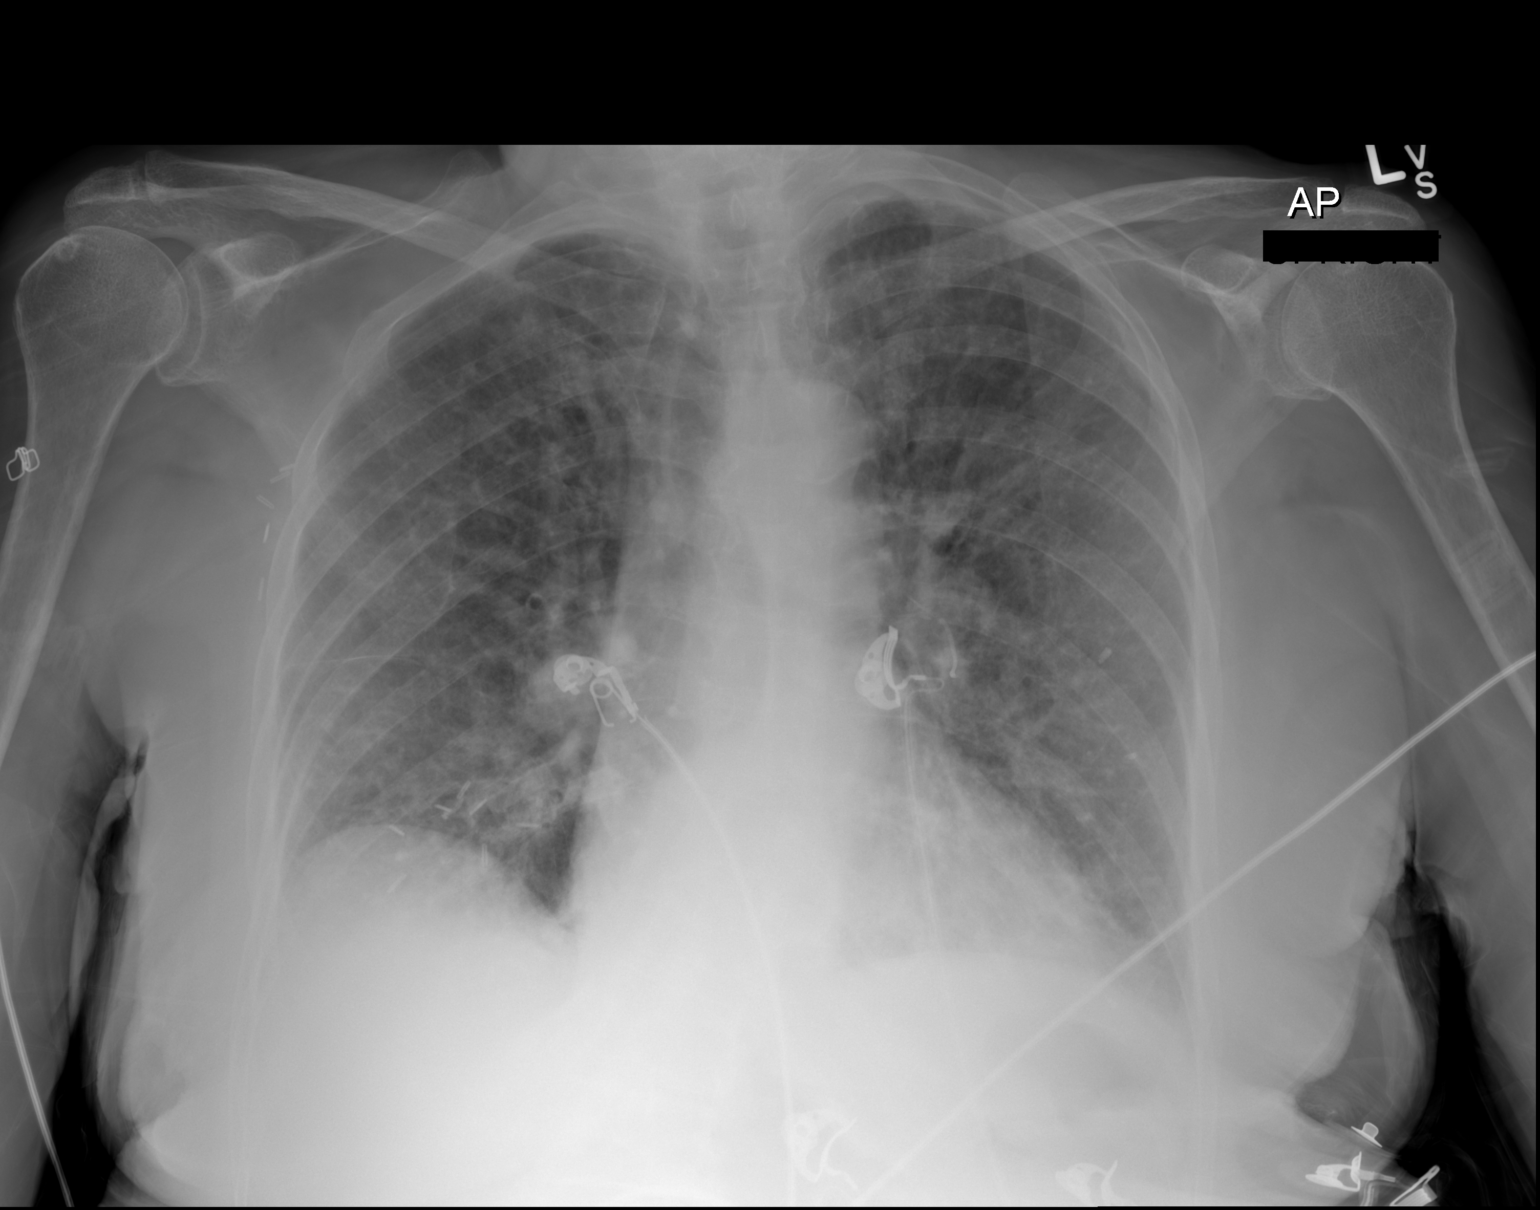

[2 of 2 positions shown; findings below may reference images not displayed]

FINDINGS: Shallow inspiration. Mild cardiac enlargement with moderate
pulmonary vascular congestion, increasing since prior study. Hazy
interstitial changes in the lung bases likely represent early
interstitial edema. Scattered calcified granulomas in the lungs.
Surgical clips in the right breast and right axilla. Degenerative
changes throughout the thoracic spine with kyphoplasty changes in
the upper lumbar spine.
IMPRESSION: Mild cardiac enlargement with pulmonary vascular congestion and
interstitial edema, increasing since prior study.

## 2014-04-24 MED ORDER — FUROSEMIDE 10 MG/ML IJ SOLN
40.0000 mg | Freq: Once | INTRAMUSCULAR | Status: AC
Start: 2014-04-24 — End: 2014-04-24
  Administered 2014-04-24: 40 mg via INTRAVENOUS
  Filled 2014-04-24: qty 4

## 2014-04-24 MED ORDER — NITROGLYCERIN 2 % TD OINT
1.0000 [in_us] | TOPICAL_OINTMENT | Freq: Once | TRANSDERMAL | Status: AC
  Administered 2014-04-24: 1 [in_us] via TOPICAL
  Filled 2014-04-24: qty 30

## 2014-04-24 MED ORDER — NITROGLYCERIN 0.4 MG SL SUBL
0.4000 mg | SUBLINGUAL_TABLET | Freq: Once | SUBLINGUAL | Status: AC
  Administered 2014-04-24: 0.4 mg via SUBLINGUAL
  Filled 2014-04-24: qty 1

## 2014-04-24 MED ORDER — SODIUM CHLORIDE 0.9 % IV SOLN
INTRAVENOUS | Status: DC
  Administered 2014-04-24: 23:00:00 via INTRAVENOUS

## 2014-04-24 NOTE — ED Notes (Signed)
Pt's pressure is high,  Per husband it was a "litle" high earlier and he thinks his wife took her blood pressure about an hour ago.  Pt states she is just tired

## 2014-04-24 NOTE — ED Notes (Signed)
Patient transported to X-ray 

## 2014-04-24 NOTE — ED Notes (Signed)
Pt denies all pain and nausea

## 2014-04-24 NOTE — ED Provider Notes (Signed)
CSN: [id number]     Arrival date & time 04/24/14  2159 History   First MD Initiated Contact with Patient 04/24/14 2203     Chief Complaint  Patient presents with  . Shortness of Breath     (Consider location/radiation/quality/duration/timing/severity/associated sxs/prior Treatment) Patient is a 80 y.o. female presenting with shortness of breath. The history is provided by the patient.  Shortness of Breath Associated symptoms: no abdominal pain, no chest pain, no cough, no fever, no headaches, no neck pain, no rash, no sore throat and no vomiting   pt c/o feeling sob for the past few days. States felt congestion and wheezy in chest. Tried taking mucinex at home for the past few days without relief.  Dyspnea worse w exertion. Denies chest pain or discomfort. Denies orthopnea or pnd. +increased bilateral leg edema. Denies hx chf or copd. No current or recent chest discomfort. Denies cough or uri c/o. States compliant w normal meds. Spouse states had been given rx for a 'fluid pill' however that pt doesn't take. Non smoker. No fever or chills.      Past Medical History  Diagnosis Date  . HTN (hypertension)   . Hypothyroidism   . Insomnia   . Actinic keratoses   . Atrial fibrillation     "only one time"  . Arthritis     back   . Cancer     Breast cancer (right)  . Compression fracture of L3 lumbar vertebra   . GERD (gastroesophageal reflux disease)     takes otc Pepcid as needed   Past Surgical History  Procedure Laterality Date  . Breast lumpectomy Right 1995    lumpectomy  . Tonsillectomy    . Colonoscopy    . Eye surgery Bilateral     cataract w/ lens implant  . Kyphoplasty N/A 09/18/2013    Procedure: KYPHOPLASTY;  Surgeon: Sinclair Ship, MD;  Location: [facility];  Service: Orthopedics;  Laterality: N/A;  Lumbar 3 kyphoplasty   History reviewed. No pertinent family history. History  Substance Use Topics  . Smoking status: Never Smoker   . Smokeless tobacco: Never  Used  . Alcohol Use: 4.2 oz/week    7 Glasses of wine per week   OB History    No data available     Review of Systems  Constitutional: Negative for fever and chills.  HENT: Negative for sore throat.   Eyes: Negative for redness.  Respiratory: Positive for shortness of breath. Negative for cough.   Cardiovascular: Positive for leg swelling. Negative for chest pain.  Gastrointestinal: Negative for vomiting, abdominal pain and diarrhea.  Endocrine: Negative for polyuria.  Genitourinary: Negative for dysuria and flank pain.  Musculoskeletal: Negative for back pain and neck pain.  Skin: Negative for rash.  Neurological: Negative for headaches.  Hematological: Does not bruise/bleed easily.  Psychiatric/Behavioral: Negative for confusion.      Allergies  Review of patient's allergies indicates no known allergies.  Home Medications   Prior to Admission medications   Medication Sig Start Date End Date Taking? Authorizing Provider  Acetaminophen (TYLENOL PO) Take 2 tablets by mouth every 6 (six) hours as needed (pain).    Historical Provider, MD  albuterol (PROVENTIL HFA;VENTOLIN HFA) 108 (90 BASE) MCG/ACT inhaler Inhale 2 puffs into the lungs every 6 (six) hours as needed for wheezing or shortness of breath.    Historical Provider, MD  calcium citrate-vitamin D (CITRACAL+D) 315-200 MG-UNIT per tablet Take 1 tablet by mouth daily.    Historical  Provider, MD  cholecalciferol (VITAMIN D) 400 UNITS TABS tablet Take 400 Units by mouth daily.    Historical Provider, MD  diazepam (VALIUM) 5 MG tablet Take 5 mg by mouth every 6 (six) hours as needed for anxiety or muscle spasms.    Historical Provider, MD  famotidine-calcium carbonate-magnesium hydroxide (PEPCID COMPLETE) 10-800-165 MG CHEW chewable tablet Chew 1 tablet by mouth daily as needed (indegestion).     Historical Provider, MD  HYDROcodone-acetaminophen (NORCO/VICODIN) 5-325 MG per tablet Take 1 tablet by mouth every 6 (six) hours as  needed for moderate pain.    Historical Provider, MD  levothyroxine (SYNTHROID, LEVOTHROID) 125 MCG tablet Take 125 mcg by mouth daily before breakfast.    Historical Provider, MD  lisinopril (PRINIVIL,ZESTRIL) 10 MG tablet Take 10 mg by mouth daily.  04/22/13   Historical Provider, MD  metoprolol (LOPRESSOR) 100 MG tablet Take 100 mg by mouth 2 (two) times daily.    Historical Provider, MD  mirabegron ER (MYRBETRIQ) 50 MG TB24 tablet Take 50 mg by mouth daily.    Historical Provider, MD  Multiple Vitamin (MULTIVITAMIN) tablet Take 1 tablet by mouth daily.    Historical Provider, MD  polyethylene glycol (MIRALAX / GLYCOLAX) packet Take 17 g by mouth daily as needed for mild constipation.    Historical Provider, MD  promethazine (PHENERGAN) 25 MG tablet Take 12.5-25 mg by mouth every 6 (six) hours as needed for nausea or vomiting. Take 1/2-1 tablet as needed for nausea    Historical Provider, MD   BP 185/124 mmHg  Pulse 78  Temp(Src) 97.6 F (36.4 C) (Oral)  Resp 18  SpO2 97% Physical Exam  Constitutional: She is oriented to person, place, and time. She appears well-developed and well-nourished. No distress.  HENT:  Mouth/Throat: Oropharynx is clear and moist.  Eyes: Conjunctivae are normal. No scleral icterus.  Neck: Neck supple. No tracheal deviation present.  Cardiovascular: Normal rate, regular rhythm, normal heart sounds and intact distal pulses.   Pulmonary/Chest: No respiratory distress.  Dyspneic. Basilar rale.   Abdominal: Soft. Normal appearance and bowel sounds are normal. She exhibits no distension. There is no tenderness.  Genitourinary:  No cva tenderness  Musculoskeletal: She exhibits edema. She exhibits no tenderness.  2-3+ bilateral leg edema to thighs, symmetric.   Neurological: She is alert and oriented to person, place, and time.  Skin: Skin is warm and dry. No rash noted. She is not diaphoretic.  Psychiatric: She has a normal mood and affect.  Nursing note and  vitals reviewed.   ED Course  Procedures (including critical care time) Labs Review  Results for orders placed or performed during the hospital encounter of 04/24/14  CBC  Result Value Ref Range   WBC 6.8 4.0 - 10.5 K/uL   RBC 3.97 3.87 - 5.11 MIL/uL   Hemoglobin 12.6 12.0 - 15.0 g/dL   HCT 36.8 36.0 - 46.0 %   MCV 92.7 78.0 - 100.0 fL   MCH 31.7 26.0 - 34.0 pg   MCHC 34.2 30.0 - 36.0 g/dL   RDW 12.8 11.5 - 15.5 %   Platelets 196 150 - 400 K/uL  Comprehensive metabolic panel  Result Value Ref Range   Sodium 121 (LL) 137 - 147 mEq/L   Potassium 3.9 3.7 - 5.3 mEq/L   Chloride 84 (L) 96 - 112 mEq/L   CO2 27 19 - 32 mEq/L   Glucose, Bld 103 (H) 70 - 99 mg/dL   BUN 17 6 - 23 mg/dL  Creatinine, Ser 0.89 0.50 - 1.10 mg/dL   Calcium 9.1 8.4 - 10.5 mg/dL   Total Protein 7.0 6.0 - 8.3 g/dL   Albumin 3.4 (L) 3.5 - 5.2 g/dL   AST 22 0 - 37 U/L   ALT 10 0 - 35 U/L   Alkaline Phosphatase 76 39 - 117 U/L   Total Bilirubin 0.3 0.3 - 1.2 mg/dL   GFR calc non Af Amer 56 (L) >90 mL/min   GFR calc Af Amer 65 (L) >90 mL/min   Anion gap 10 5 - 15  Pro b natriuretic peptide (BNP)  Result Value Ref Range   Pro B Natriuretic peptide (BNP) 824.3 (H) 0 - 450 pg/mL  Troponin I  Result Value Ref Range   Troponin I <0.30 <0.30 ng/mL   Dg Chest 2 View  04/24/2014   CLINICAL DATA:  Wheezing and crackles in the lower lobes. Shortness of breath.  EXAM: CHEST  2 VIEW  COMPARISON:  02/03/2014  FINDINGS: Shallow inspiration. Mild cardiac enlargement with moderate pulmonary vascular congestion, increasing since prior study. Hazy interstitial changes in the lung bases likely represent early interstitial edema. Scattered calcified granulomas in the lungs. Surgical clips in the right breast and right axilla. Degenerative changes throughout the thoracic spine with kyphoplasty changes in the upper lumbar spine.  IMPRESSION: Mild cardiac enlargement with pulmonary vascular congestion and interstitial edema,  increasing since prior study.   Electronically Signed   By: Lucienne Capers M.D.   On: 04/24/2014 23:15       EKG Interpretation   Date/Time:  Friday April 24 2014 22:57:50 EST Ventricular Rate:  67 PR Interval:  236 QRS Duration: 76 QT Interval:  431 QTC Calculation: 455 R Axis:   2 Text Interpretation:  Sinus rhythm Prolonged PR interval No significant  change since last tracing Confirmed by Ashok Cordia  MD, Lennette Bihari ([id number]) on  04/24/2014 11:04:51 PM      MDM  Iv ns. Labs. Cxr.  Continuous pulse ox and monitor. Ecg. o2 [place].   Reviewed nursing notes and prior charts for additional history.   Recheck bp 221/112.  Pt denies headache. No cp.   Given severe htn, sob/rales, ?pulm edema exacerbated by severe htn/afterload problem.  ntg sl, ntg paste.   Lasix iv.  Recheck pt improved. No cp. Dyspnea sl improved from prior.   Med service called for admission.  CRITICAL CARE  RE pulmonary edema/chf, severe hypertension, marked hyponatremia.  Performed by: Mirna Mires Total critical care time: 35 Critical care time was exclusive of separately billable procedures and treating other patients. Critical care was necessary to treat or prevent imminent or life-threatening deterioration. Critical care was time spent personally by me on the following activities: development of treatment plan with patient and/or surrogate as well as nursing, discussions with consultants, evaluation of patient's response to treatment, examination of patient, obtaining history from patient or surrogate, ordering and performing treatments and interventions, ordering and review of laboratory studies, ordering and review of radiographic studies, pulse oximetry and re-evaluation of patient's condition.      Mirna Mires, MD 04/24/14 [phone]

## 2014-04-24 NOTE — ED Notes (Signed)
Bed: [id number] Expected date:  Expected time:  Means of arrival:  Comments: EMS shortness of breath

## 2014-04-24 NOTE — ED Notes (Signed)
Pt arrived via EMS from [facility],  The night nurse came by to check on her and she said there was audible wheezing and crackles in lower lobes,  She called pt's pcp and he said bring her to ED

## 2014-04-24 NOTE — ED Notes (Signed)
Pt ambulatory to bathroom with 2 staff assist.

## 2014-04-25 ENCOUNTER — Encounter (HOSPITAL_COMMUNITY): Payer: Self-pay | Admitting: Internal Medicine

## 2014-04-25 DIAGNOSIS — I5033 Acute on chronic diastolic (congestive) heart failure: Principal | ICD-10-CM

## 2014-04-25 DIAGNOSIS — E871 Hypo-osmolality and hyponatremia: Secondary | ICD-10-CM

## 2014-04-25 DIAGNOSIS — R0602 Shortness of breath: Secondary | ICD-10-CM

## 2014-04-25 DIAGNOSIS — I10 Essential (primary) hypertension: Secondary | ICD-10-CM

## 2014-04-25 DIAGNOSIS — I5031 Acute diastolic (congestive) heart failure: Secondary | ICD-10-CM | POA: Diagnosis present

## 2014-04-25 LAB — COMPREHENSIVE METABOLIC PANEL
ALT: 10 U/L (ref 0–35)
AST: 23 U/L (ref 0–37)
Albumin: 3.5 g/dL (ref 3.5–5.2)
Alkaline Phosphatase: 75 U/L (ref 39–117)
Anion gap: 11 (ref 5–15)
BUN: 17 mg/dL (ref 6–23)
CALCIUM: 8.9 mg/dL (ref 8.4–10.5)
CO2: 28 meq/L (ref 19–32)
CREATININE: 0.79 mg/dL (ref 0.50–1.10)
Chloride: 85 mEq/L — ABNORMAL LOW (ref 96–112)
GFR calc Af Amer: 83 mL/min — ABNORMAL LOW (ref >90)
GFR calc non Af Amer: 72 mL/min — ABNORMAL LOW (ref >90)
Glucose, Bld: 101 mg/dL — ABNORMAL HIGH (ref 70–99)
Potassium: 3.6 mEq/L — ABNORMAL LOW (ref 3.7–5.3)
Sodium: 124 mEq/L — ABNORMAL LOW (ref 137–147)
Total Bilirubin: 0.3 mg/dL (ref 0.3–1.2)
Total Protein: 6.8 g/dL (ref 6.0–8.3)

## 2014-04-25 LAB — BASIC METABOLIC PANEL
ANION GAP: 14 (ref 5–15)
Anion gap: 15 (ref 5–15)
BUN: 14 mg/dL (ref 6–23)
BUN: 22 mg/dL (ref 6–23)
CHLORIDE: 85 meq/L — AB (ref 96–112)
CHLORIDE: 82 meq/L — AB (ref 96–112)
CO2: 28 meq/L (ref 19–32)
CO2: 30 meq/L (ref 19–32)
Calcium: 8.5 mg/dL (ref 8.4–10.5)
Calcium: 9.1 mg/dL (ref 8.4–10.5)
Creatinine, Ser: 0.78 mg/dL (ref 0.50–1.10)
Creatinine, Ser: 1.01 mg/dL (ref 0.50–1.10)
GFR calc Af Amer: 83 mL/min — ABNORMAL LOW (ref >90)
GFR calc Af Amer: 55 mL/min — ABNORMAL LOW (ref >90)
GFR calc non Af Amer: 72 mL/min — ABNORMAL LOW (ref >90)
GFR calc non Af Amer: 48 mL/min — ABNORMAL LOW (ref >90)
GLUCOSE: 187 mg/dL — AB (ref 70–99)
Glucose, Bld: 119 mg/dL — ABNORMAL HIGH (ref 70–99)
Potassium: 4 mEq/L (ref 3.7–5.3)
Potassium: 4 mEq/L (ref 3.7–5.3)
Sodium: 128 mEq/L — ABNORMAL LOW (ref 137–147)
Sodium: 126 mEq/L — ABNORMAL LOW (ref 137–147)

## 2014-04-25 LAB — PRO B NATRIURETIC PEPTIDE: PRO B NATRI PEPTIDE: 977.6 pg/mL — AB (ref 0–450)

## 2014-04-25 LAB — CBC WITH DIFFERENTIAL/PLATELET
BASOS ABS: 0 10*3/uL (ref 0.0–0.1)
BASOS PCT: 1 % (ref 0–1)
EOS PCT: 3 % (ref 0–5)
Eosinophils Absolute: 0.2 10*3/uL (ref 0.0–0.7)
HEMATOCRIT: 35.4 % — AB (ref 36.0–46.0)
HEMOGLOBIN: 12.4 g/dL (ref 12.0–15.0)
LYMPHS PCT: 19 % (ref 12–46)
Lymphs Abs: 1.3 10*3/uL (ref 0.7–4.0)
MCH: 32 pg (ref 26.0–34.0)
MCHC: 35 g/dL (ref 30.0–36.0)
MCV: 91.5 fL (ref 78.0–100.0)
MONO ABS: 1.3 10*3/uL — AB (ref 0.1–1.0)
MONOS PCT: 20 % — AB (ref 3–12)
NEUTROS ABS: 3.9 10*3/uL (ref 1.7–7.7)
Neutrophils Relative %: 57 % (ref 43–77)
Platelets: 206 10*3/uL (ref 150–400)
RBC: 3.87 MIL/uL (ref 3.87–5.11)
RDW: 12.8 % (ref 11.5–15.5)
WBC: 6.9 10*3/uL (ref 4.0–10.5)

## 2014-04-25 LAB — TROPONIN I
Troponin I: <0.3 ng/mL (ref <0.30)
Troponin I: <0.3 ng/mL (ref <0.30)

## 2014-04-25 LAB — MAGNESIUM: MAGNESIUM: 1.9 mg/dL (ref 1.5–2.5)

## 2014-04-25 LAB — OSMOLALITY, URINE: OSMOLALITY UR: 253 mosm/kg — AB (ref 390–1090)

## 2014-04-25 LAB — TSH: TSH: 16.43 u[IU]/mL — ABNORMAL HIGH (ref 0.350–4.500)

## 2014-04-25 MED ORDER — ONDANSETRON HCL 4 MG/2ML IJ SOLN
4.0000 mg | Freq: Four times a day (QID) | INTRAMUSCULAR | Status: DC | PRN
  Administered 2014-04-25: 4 mg via INTRAVENOUS
  Filled 2014-04-25: qty 2

## 2014-04-25 MED ORDER — SODIUM CHLORIDE 0.9 % IJ SOLN
3.0000 mL | Freq: Two times a day (BID) | INTRAMUSCULAR | Status: DC
  Administered 2014-04-25 – 2014-04-26 (×3): 3 mL via INTRAVENOUS

## 2014-04-25 MED ORDER — LEVOTHYROXINE SODIUM 125 MCG PO TABS
125.0000 ug | ORAL_TABLET | Freq: Every day | ORAL | Status: DC
  Administered 2014-04-25 – 2014-04-26 (×2): 125 ug via ORAL
  Filled 2014-04-25 (×2): qty 1

## 2014-04-25 MED ORDER — MIRABEGRON ER 50 MG PO TB24
50.0000 mg | ORAL_TABLET | Freq: Every day | ORAL | Status: DC
  Administered 2014-04-25 – 2014-04-26 (×2): 50 mg via ORAL
  Filled 2014-04-25 (×2): qty 1

## 2014-04-25 MED ORDER — ACETAMINOPHEN 325 MG PO TABS
650.0000 mg | ORAL_TABLET | ORAL | Status: DC | PRN
Start: 2014-04-25 — End: 2014-04-26
  Administered 2014-04-25 – 2014-04-26 (×2): 650 mg via ORAL
  Filled 2014-04-25 (×2): qty 2

## 2014-04-25 MED ORDER — SODIUM CHLORIDE 0.9 % IV SOLN: 250.0000 mL | INTRAVENOUS | Status: DC | PRN

## 2014-04-25 MED ORDER — LISINOPRIL 10 MG PO TABS
10.0000 mg | ORAL_TABLET | Freq: Every day | ORAL | Status: DC
  Administered 2014-04-25 – 2014-04-26 (×2): 10 mg via ORAL
  Filled 2014-04-25 (×2): qty 1

## 2014-04-25 MED ORDER — METOPROLOL TARTRATE 50 MG PO TABS
100.0000 mg | ORAL_TABLET | Freq: Two times a day (BID) | ORAL | Status: DC
  Administered 2014-04-25 – 2014-04-26 (×3): 100 mg via ORAL
  Filled 2014-04-25 (×3): qty 2

## 2014-04-25 MED ORDER — CYCLOBENZAPRINE HCL 5 MG PO TABS
5.0000 mg | ORAL_TABLET | Freq: Two times a day (BID) | ORAL | Status: DC | PRN
  Filled 2014-04-25: qty 1

## 2014-04-25 MED ORDER — ALBUTEROL SULFATE (2.5 MG/3ML) 0.083% IN NEBU: 2.5000 mg | INHALATION_SOLUTION | RESPIRATORY_TRACT | Status: DC | PRN

## 2014-04-25 MED ORDER — LIP MEDEX EX OINT
TOPICAL_OINTMENT | CUTANEOUS | Status: DC | PRN
  Filled 2014-04-25: qty 7

## 2014-04-25 MED ORDER — SODIUM CHLORIDE 0.9 % IJ SOLN: 3.0000 mL | INTRAMUSCULAR | Status: DC | PRN

## 2014-04-25 MED ORDER — ENOXAPARIN SODIUM 40 MG/0.4ML ~~LOC~~ SOLN
40.0000 mg | SUBCUTANEOUS | Status: DC
  Administered 2014-04-25 – 2014-04-26 (×2): 40 mg via SUBCUTANEOUS
  Filled 2014-04-25 (×3): qty 0.4

## 2014-04-25 MED ORDER — MELATONIN 5 MG PO TABS: 1.0000 | ORAL_TABLET | Freq: Every evening | ORAL | Status: DC | PRN

## 2014-04-25 MED ORDER — FUROSEMIDE 10 MG/ML IJ SOLN
40.0000 mg | Freq: Two times a day (BID) | INTRAMUSCULAR | Status: DC
  Administered 2014-04-25 – 2014-04-26 (×3): 40 mg via INTRAVENOUS
  Filled 2014-04-25 (×3): qty 4

## 2014-04-25 MED ORDER — ASPIRIN EC 81 MG PO TBEC
81.0000 mg | DELAYED_RELEASE_TABLET | Freq: Every day | ORAL | Status: DC
  Administered 2014-04-25 – 2014-04-26 (×2): 81 mg via ORAL
  Filled 2014-04-25 (×2): qty 1

## 2014-04-25 NOTE — Progress Notes (Signed)
PHARMACIST - PHYSICIAN ORDER COMMUNICATION  CONCERNING: P&T Medication Policy on Herbal Medications  DESCRIPTION:  This patient's order for:  Melatonin  has been noted.  This product(s) is classified as an "herbal" or natural product. Due to a lack of definitive safety studies or FDA approval, nonstandard manufacturing practices, plus the potential risk of unknown drug-drug interactions while on inpatient medications, the Pharmacy and Therapeutics Committee does not permit the use of "herbal" or natural products of this type within [facility].   ACTION TAKEN: The pharmacy department is unable to verify this order at this time and your patient has been informed of this safety policy. Please reevaluate patient's clinical condition at discharge and address if the herbal or natural product(s) should be resumed at that time.  Jinnie Onley, PharmD  

## 2014-04-25 NOTE — Progress Notes (Signed)
[facility]                                                                              Patient Demographics  Cassandra Ray, is a 80 y.o. female, DOB - 04-21-1923, [id number]  Admit date - 04/24/2014   Admitting Physician Toy Baker, MD  Outpatient Primary MD for the patient is  Melinda Crutch, MD  LOS - 1   Chief Complaint  Patient presents with  . Shortness of Breath      HPI on 04/25/2014 80 year old female with history of hypertension, hypothyroidism, atrial fibrillation, presented to the emergency department with complaints of shortness of breath for the past several days. She denied any chest pain. Patient does have a history of grade 3 diastolic CHF based on cardiology notes from March 2015 with preserved EF.  Patient is not on diuretics at baseline. Per family, over the last several days patient has been having worsening wheezing and was started on albuterol. Family thinks that the patient is not been compliant with her medications. Patient presented to the emergency room and was found to be hypertensive with blood pressure 202/106. Patient reported taking her medications as prescribed. In emergency department patient was given 40 mg of Lasix IV and Nitropaste. At the time of admission, her blood pressure was 163/78, troponin was normal, BNP elevated at 824. Chest x-ray showed evidence of pulmonary edema and cardia megaly. Hospitalist was called for admission for hypertensive urgency and heart failure exacerbation.  Assessment & Plan   Patient is better this morning by Dr. Toy Baker. Agree with current assessment and plan.  Acute on chronic diastolic heart failure -Per patient, shortness of breath has improved -Echocardiogram pending -Continue to monitor daily weights, intake and output -Continue Lasix 40 mg IV twice a day  Hypertensive urgency -Continue metoprolol, lisinopril, Lasix -We'll provide medications as needed  Hypervolemic  Hyponatremia -Likely secondary to volume overload -Continue fluid restriction as well as diuresis -Currently no neurological complaints -Sodium slightly improved  Hypothyroidism -Continue Synthroid  Code Status: DO NOT RESUSCITATE  Family Communication: None bedside  Disposition Plan: Admitted  Time Spent in minutes   30 minutes  Procedures  None  Consults   None  DVT Prophylaxis  Lovenox  Lab Results  Component Value Date   PLT 206 04/25/2014    Medications  Scheduled Meds: . aspirin EC  81 mg Oral Daily  . enoxaparin (LOVENOX) injection  40 mg Subcutaneous Q24H  . furosemide  40 mg Intravenous BID  . levothyroxine  125 mcg Oral QAC breakfast  . lisinopril  10 mg Oral Daily  . metoprolol  100 mg Oral BID  . mirabegron ER  50 mg Oral Daily  . sodium chloride  3 mL Intravenous Q12H   Continuous Infusions:  PRN Meds:.sodium chloride, acetaminophen, albuterol, lip balm, ondansetron (ZOFRAN) IV, sodium chloride  Antibiotics    Anti-infectives    None        Subjective:   Cassandra Ray seen and examined today.  Patient feels her breathing has improved. She currently denies any chest pain, palpitations, abdominal pain, nausea, vomiting, diarrhea or constipation.   Objective:   Filed Vitals:   04/24/14 2330 04/24/14  2345 04/25/14 0155 04/25/14 0500  BP: 180/89 178/91 177/79 175/85  Pulse: 65 63 66 64  Temp:   97.8 F (36.6 C) 97.8 F (36.6 C)  TempSrc:   Oral Oral  Resp: 14 15 16 18   Height:   5\' 4"  (1.626 m)   Weight:   75.1 kg (165 lb 9.1 oz)   SpO2: 96% 95% 100% 96%    Wt Readings from Last 3 Encounters:  04/25/14 75.1 kg (165 lb 9.1 oz)  09/17/13 68.947 kg (152 lb)  08/11/13 71.215 kg (157 lb)     Intake/Output Summary (Last 24 hours) at 04/25/14 1058 Last data filed at 04/25/14 0951  Gross per 24 hour  Intake    240 ml  Output   2600 ml  Net  -2360 ml    Exam  General: Well developed, well nourished, NAD, appears stated  age  HEENT: NCAT, mucous membranes moist.   Cardiovascular: S1 S2 auscultated, no rubs, murmurs or gallops. Regular rate and rhythm.  Respiratory: Occasional expiratory wheezing, bibasilar crackles  Abdomen: Soft, nontender, nondistended, + bowel sounds  Extremities: warm dry without cyanosis clubbing. +1 pitting edema in the lower extremities bilaterally  Neuro: AAOx3, no focal deficits  Psych: Appropriate mood and affect  Data Review   Micro Results No results found for this or any previous visit (from the past 240 hour(s)).  Radiology Reports Dg Chest 2 View  04/24/2014   CLINICAL DATA:  Wheezing and crackles in the lower lobes. Shortness of breath.  EXAM: CHEST  2 VIEW  COMPARISON:  02/03/2014  FINDINGS: Shallow inspiration. Mild cardiac enlargement with moderate pulmonary vascular congestion, increasing since prior study. Hazy interstitial changes in the lung bases likely represent early interstitial edema. Scattered calcified granulomas in the lungs. Surgical clips in the right breast and right axilla. Degenerative changes throughout the thoracic spine with kyphoplasty changes in the upper lumbar spine.  IMPRESSION: Mild cardiac enlargement with pulmonary vascular congestion and interstitial edema, increasing since prior study.   Electronically Signed   By: Lucienne Capers M.D.   On: 04/24/2014 23:15    CBC  Recent Labs Lab 04/24/14 2235 04/25/14 0225  WBC 6.8 6.9  HGB 12.6 12.4  HCT 36.8 35.4*  PLT 196 206  MCV 92.7 91.5  MCH 31.7 32.0  MCHC 34.2 35.0  RDW 12.8 12.8  LYMPHSABS  --  1.3  MONOABS  --  1.3*  EOSABS  --  0.2  BASOSABS  --  0.0    Chemistries   Recent Labs Lab 04/24/14 2235 04/25/14 0225  NA 121* 124*  K 3.9 3.6*  CL 84* 85*  CO2 27 28  GLUCOSE 103* 101*  BUN 17 17  CREATININE 0.89 0.79  CALCIUM 9.1 8.9  MG  --  1.9  AST 22 23  ALT 10 10  ALKPHOS 76 75  BILITOT 0.3 0.3    ------------------------------------------------------------------------------------------------------------------ estimated creatinine clearance is 47.3 mL/min (by C-G formula based on Cr of 0.79). ------------------------------------------------------------------------------------------------------------------ No results for input(s): HGBA1C in the last 72 hours. ------------------------------------------------------------------------------------------------------------------ No results for input(s): CHOL, HDL, LDLCALC, TRIG, CHOLHDL, LDLDIRECT in the last 72 hours. ------------------------------------------------------------------------------------------------------------------  Recent Labs  04/25/14 0225  TSH 16.430*   ------------------------------------------------------------------------------------------------------------------ No results for input(s): VITAMINB12, FOLATE, FERRITIN, TIBC, IRON, RETICCTPCT in the last 72 hours.  Coagulation profile No results for input(s): INR, PROTIME in the last 168 hours.  No results for input(s): DDIMER in the last 72 hours.  Cardiac Enzymes  Recent Labs Lab 04/24/14  2235 04/25/14 0225 04/25/14 0700  TROPONINI <0.30 <0.30 <0.30   ------------------------------------------------------------------------------------------------------------------ Invalid input(s): POCBNP    Jamesha Ellsworth D.O. on 04/25/2014 at 10:58 AM  Between 7am to 7pm - Pager - [phone]  After 7pm go to [email or web address] - password [email or web address]  And look for the night coverage person covering for me after hours  [facility] Office  [phone]

## 2014-04-25 NOTE — Plan of Care (Signed)
Problem: Consults Goal: Skin Care Protocol Initiated - if Braden Score 18 or less If consults are not indicated, leave blank or document N/A Outcome: Completed/Met Date Met:  04/25/14 Goal: Tobacco Cessation referral if indicated Outcome: Completed/Met Date Met:  04/25/14 Goal: Nutrition Consult-if indicated Outcome: Not Applicable Date Met:  55/20/80 Goal: Diabetes Guidelines if Diabetic/Glucose > 140 If diabetic or lab glucose is > 140 mg/dl - Initiate Diabetes/Hyperglycemia Guidelines & Document Interventions  Outcome: Not Applicable Date Met:  22/33/61  Problem: Phase I Progression Outcomes Goal: Dyspnea controlled at rest (HF) Outcome: Completed/Met Date Met:  04/25/14 Goal: Hemodynamically stable Outcome: Completed/Met Date Met:  04/25/14

## 2014-04-25 NOTE — H&P (Addendum)
PCP:  Melinda Crutch, MD    Chief Complaint:  Shortness of breath  HPI: Cassandra Ray is a 80 y.o. female   has a past medical history of HTN (hypertension); Hypothyroidism; Insomnia; Actinic keratoses; Atrial fibrillation; Arthritis; Cancer; Compression fracture of L3 lumbar vertebra; and GERD (gastroesophageal reflux disease).   Presented with  Patient reports being short of breath for the past few days. No chest pain. She has hx of grade 3 diastolic CHF based on Cardiology note from March of this year with preserved EF. She is not on diuretics at baseline. Family states for past few days she been having worsening wheezing. She was started on albuterol. Family thinks that she has not been as compliant with her medications as she states. Patient presented to [facility] ER and was found be hypertensive up to 202/106. Patient reports taking her medications as prescribed.  In ER she was given Lasix 40 IV and nitro paste. Currently blood pressure down to 163/78. Troponin wnl, BNP was elevated to 824. Chest x-ray showed evidence of pulmonary edema and cardiomegaly  Hospitalist was called for admission for hypertensive urgency and CHF exacerbation  Review of Systems:    Pertinent positives include:  Bilateral lower extremity swelling shortness of breath at rest. dyspnea on exertion,   Constitutional:  No weight loss, night sweats, Fevers, chills, fatigue, weight loss  HEENT:  No headaches, Difficulty swallowing,Tooth/dental problems,Sore throat,  No sneezing, itching, ear ache, nasal congestion, post nasal drip,  Cardio-vascular:  No chest pain, Orthopnea, PND, anasarca, dizziness, palpitations.no GI:  No heartburn, indigestion, abdominal pain, nausea, vomiting, diarrhea, change in bowel habits, loss of appetite, melena, blood in stool, hematemesis Resp:   No excess mucus, no productive cough, No non-productive cough, No coughing up of blood.No change in color of mucus.No wheezing. Skin:  no  rash or lesions. No jaundice GU:  no dysuria, change in color of urine, no urgency or frequency. No straining to urinate.  No flank pain.  Musculoskeletal:  No joint pain or no joint swelling. No decreased range of motion. No back pain.  Psych:  No change in mood or affect. No depression or anxiety. No memory loss.  Neuro: no localizing neurological complaints, no tingling, no weakness, no double vision, no gait abnormality, no slurred speech, no confusion  Otherwise ROS are negative except for above, 10 systems were reviewed  Past Medical History: Past Medical History  Diagnosis Date  . HTN (hypertension)   . Hypothyroidism   . Insomnia   . Actinic keratoses   . Atrial fibrillation     "only one time"  . Arthritis     back   . Cancer     Breast cancer (right)  . Compression fracture of L3 lumbar vertebra   . GERD (gastroesophageal reflux disease)     takes otc Pepcid as needed   Past Surgical History  Procedure Laterality Date  . Breast lumpectomy Right 1995    lumpectomy  . Tonsillectomy    . Colonoscopy    . Eye surgery Bilateral     cataract w/ lens implant  . Kyphoplasty N/A 09/18/2013    Procedure: KYPHOPLASTY;  Surgeon: Sinclair Ship, MD;  Location: [facility];  Service: Orthopedics;  Laterality: N/A;  Lumbar 3 kyphoplasty     Medications: Prior to Admission medications   Medication Sig Start Date End Date Taking? Authorizing Provider  Acetaminophen (TYLENOL PO) Take 2 tablets by mouth every 6 (six) hours as needed (pain).   Yes  Historical Provider, MD  albuterol (PROVENTIL HFA;VENTOLIN HFA) 108 (90 BASE) MCG/ACT inhaler Inhale 2 puffs into the lungs every 6 (six) hours as needed for wheezing or shortness of breath.   Yes Historical Provider, MD  Calcium Citrate-Vitamin D (CITRACAL + D PO) Take 1 tablet by mouth daily. 630 mg/ 400mg    Yes Historical Provider, MD  cholecalciferol (VITAMIN D) 400 UNITS TABS tablet Take 1,200 Units by mouth daily.    Yes  Historical Provider, MD  famotidine-calcium carbonate-magnesium hydroxide (PEPCID COMPLETE) 10-800-165 MG CHEW chewable tablet Chew 1 tablet by mouth daily as needed (indegestion).    Yes Historical Provider, MD  Ibuprofen-Diphenhydramine Cit (ADVIL PM PO) Take 1 tablet by mouth at bedtime as needed (sleep).   Yes Historical Provider, MD  levothyroxine (SYNTHROID, LEVOTHROID) 125 MCG tablet Take 125 mcg by mouth daily before breakfast.   Yes Historical Provider, MD  lisinopril (PRINIVIL,ZESTRIL) 10 MG tablet Take 10 mg by mouth daily.  04/22/13  Yes Historical Provider, MD  Melatonin 5 MG TABS Take 1 tablet by mouth at bedtime as needed (sleep).   Yes Historical Provider, MD  metoprolol (LOPRESSOR) 100 MG tablet Take 100 mg by mouth 2 (two) times daily.   Yes Historical Provider, MD  mirabegron ER (MYRBETRIQ) 50 MG TB24 tablet Take 50 mg by mouth daily.   Yes Historical Provider, MD  Multiple Vitamin (MULTIVITAMIN) tablet Take 1 tablet by mouth daily.   Yes Historical Provider, MD  Probiotic Product (ALIGN PO) Take 1 tablet by mouth daily.   Yes Historical Provider, MD    Allergies:  No Known Allergies  Social History:  Ambulatory  walker   From facility [facility]   reports that she has never smoked. She has never used smokeless tobacco. She reports that she drinks about 4.2 oz of alcohol per week. She reports that she does not use illicit drugs.    Family History: family history includes CAD in her mother; Diabetes Mellitus II in her mother; Heart failure in her father; Kidney cancer in her brother; Prostate cancer in her brother.    Physical Exam: Patient Vitals for the past 24 hrs:  BP Temp Temp src Pulse Resp SpO2  04/24/14 2345 178/91 mmHg - - 63 15 95 %  04/24/14 2330 180/89 mmHg - - 65 14 96 %  04/24/14 2315 - - - 64 17 94 %  04/24/14 2300 172/93 mmHg - - 65 16 96 %  04/24/14 2257 186/84 mmHg - - - - 96 %  04/24/14 2230 (!) 202/106 mmHg - - 70 19 98 %    04/24/14 2211 (!) 185/124 mmHg 97.6 F (36.4 C) Oral 78 18 97 %    1. General:  in No Acute distress 2. Psychological: Alert and   Oriented 3. Head/ENT:   Moist  Mucous Membranes                          Head Non traumatic, neck supple                          Normal  Dentition 4. SKIN: normal  Skin turgor,  Skin clean Dry and intact no rash, multiple Actinic Keratosis 5. Heart: Regular rate and rhythm no Murmur, Rub or gallop 6. Lungs: Occasional wheezes and crackles at the bases 7. Abdomen: Soft, non-tender, Non distended 8. Lower extremities: no clubbing, cyanosis, 1+ pitting edema 9. Neurologically Grossly intact, moving  all 4 extremities equally 10. MSK: Normal range of motion  body mass index is unknown because there is no weight on file.   Labs on Admission:   Results for orders placed or performed during the hospital encounter of 04/24/14 (from the past 24 hour(s))  CBC     Status: None   Collection Time: 04/24/14 10:35 PM  Result Value Ref Range   WBC 6.8 4.0 - 10.5 K/uL   RBC 3.97 3.87 - 5.11 MIL/uL   Hemoglobin 12.6 12.0 - 15.0 g/dL   HCT 36.8 36.0 - 46.0 %   MCV 92.7 78.0 - 100.0 fL   MCH 31.7 26.0 - 34.0 pg   MCHC 34.2 30.0 - 36.0 g/dL   RDW 12.8 11.5 - 15.5 %   Platelets 196 150 - 400 K/uL  Comprehensive metabolic panel     Status: Abnormal   Collection Time: 04/24/14 10:35 PM  Result Value Ref Range   Sodium 121 (LL) 137 - 147 mEq/L   Potassium 3.9 3.7 - 5.3 mEq/L   Chloride 84 (L) 96 - 112 mEq/L   CO2 27 19 - 32 mEq/L   Glucose, Bld 103 (H) 70 - 99 mg/dL   BUN 17 6 - 23 mg/dL   Creatinine, Ser 0.89 0.50 - 1.10 mg/dL   Calcium 9.1 8.4 - 10.5 mg/dL   Total Protein 7.0 6.0 - 8.3 g/dL   Albumin 3.4 (L) 3.5 - 5.2 g/dL   AST 22 0 - 37 U/L   ALT 10 0 - 35 U/L   Alkaline Phosphatase 76 39 - 117 U/L   Total Bilirubin 0.3 0.3 - 1.2 mg/dL   GFR calc non Af Amer 56 (L) >90 mL/min   GFR calc Af Amer 65 (L) >90 mL/min   Anion gap 10 5 - 15  Pro b natriuretic  peptide (BNP)     Status: Abnormal   Collection Time: 04/24/14 10:35 PM  Result Value Ref Range   Pro B Natriuretic peptide (BNP) 824.3 (H) 0 - 450 pg/mL  Troponin I     Status: None   Collection Time: 04/24/14 10:35 PM  Result Value Ref Range   Troponin I <0.30 <0.30 ng/mL  Urinalysis, Routine w reflex microscopic     Status: None   Collection Time: 04/24/14 10:57 PM  Result Value Ref Range   Color, Urine YELLOW YELLOW   APPearance CLEAR CLEAR   Specific Gravity, Urine 1.009 1.005 - 1.030   pH 7.5 5.0 - 8.0   Glucose, UA NEGATIVE NEGATIVE mg/dL   Hgb urine dipstick NEGATIVE NEGATIVE   Bilirubin Urine NEGATIVE NEGATIVE   Ketones, ur NEGATIVE NEGATIVE mg/dL   Protein, ur NEGATIVE NEGATIVE mg/dL   Urobilinogen, UA 0.2 0.0 - 1.0 mg/dL   Nitrite NEGATIVE NEGATIVE   Leukocytes, UA NEGATIVE NEGATIVE    UA no evidence of UTI  No results found for: HGBA1C  CrCl cannot be calculated (Unknown ideal weight.).  BNP (last 3 results)  Recent Labs  04/24/14 2235  PROBNP 824.3*    Other results:  I have pearsonaly reviewed this: ECG REPORT  Rate: 67  Rhythm: NSR ST&T Change: no ischemic changes   There were no vitals filed for this visit.   Cultures:    Component Value Date/Time   SDES CSF 08/08/2009 2252   SPECREQUEST NONE 08/08/2009 2252   CULT NO GROWTH 3 DAYS 08/08/2009 2249   REPTSTATUS 08/09/2009 FINAL 08/08/2009 2252     Radiological Exams on Admission: Dg Chest 2  View  04/24/2014   CLINICAL DATA:  Wheezing and crackles in the lower lobes. Shortness of breath.  EXAM: CHEST  2 VIEW  COMPARISON:  02/03/2014  FINDINGS: Shallow inspiration. Mild cardiac enlargement with moderate pulmonary vascular congestion, increasing since prior study. Hazy interstitial changes in the lung bases likely represent early interstitial edema. Scattered calcified granulomas in the lungs. Surgical clips in the right breast and right axilla. Degenerative changes throughout the thoracic  spine with kyphoplasty changes in the upper lumbar spine.  IMPRESSION: Mild cardiac enlargement with pulmonary vascular congestion and interstitial edema, increasing since prior study.   Electronically Signed   By: Lucienne Capers M.D.   On: 04/24/2014 23:15    Chart has been reviewed  Assessment/Plan  80 year old female with history of diastolic heart failure presents with fluid overload and hypertensive urgency, improved with IV Lasix   Present on Admission:  . Acute on chronic diastolic CHF (congestive heart failure) - on exam wheezing and crackles both suggestive of heart failure confirmed by chest x-ray. We'll continue with diuretics. Place a Foley this patient is a fall risk. Monitor creatinine cycle cardiac enzymes, obtain echogram in the morning  . Hypertensive urgency - currently improved continue home medications metoprolol and lisinopril. Blood pressure improved after administration of diuretics and Nitropaste  . Hyponatremia - patient appears to be fluid overloaded. We'll obtain urine electrolytes. Fluid restriction,  Diuresis and monitor sodium, of note patient have had low sodiums in the past. Currently denies any neurological complaints   Prophylaxis:  Lovenox,    CODE STATUS:    DNR/DNI as per family  Other plan as per orders.  I have spent a total of 55 min on this admission  Keeshawn Fakhouri 04/25/2014, 12:34 AM  [facility]  Pager [phone]   after 2 AM please page floor coverage PA If 7AM-7PM, please contact the day team taking care of the patient  [email or web address]  Password [email or web address]

## 2014-04-25 NOTE — Progress Notes (Signed)
Echocardiogram 2D Echocardiogram has been performed.  Doyle Askew 04/25/2014, 12:05 PM

## 2014-04-26 DIAGNOSIS — E039 Hypothyroidism, unspecified: Secondary | ICD-10-CM

## 2014-04-26 LAB — BASIC METABOLIC PANEL
ANION GAP: 13 (ref 5–15)
Anion gap: 12 (ref 5–15)
BUN: 26 mg/dL — ABNORMAL HIGH (ref 6–23)
BUN: 22 mg/dL (ref 6–23)
CO2: 29 mEq/L (ref 19–32)
CO2: 28 meq/L (ref 19–32)
Calcium: 8.8 mg/dL (ref 8.4–10.5)
Calcium: 8.7 mg/dL (ref 8.4–10.5)
Chloride: 85 mEq/L — ABNORMAL LOW (ref 96–112)
Chloride: 83 mEq/L — ABNORMAL LOW (ref 96–112)
Creatinine, Ser: 0.99 mg/dL (ref 0.50–1.10)
Creatinine, Ser: 0.92 mg/dL (ref 0.50–1.10)
GFR calc non Af Amer: 49 mL/min — ABNORMAL LOW (ref >90)
GFR calc non Af Amer: 54 mL/min — ABNORMAL LOW (ref >90)
GFR, EST AFRICAN AMERICAN: 57 mL/min — AB (ref >90)
GFR, EST AFRICAN AMERICAN: 62 mL/min — AB (ref >90)
Glucose, Bld: 116 mg/dL — ABNORMAL HIGH (ref 70–99)
Glucose, Bld: 178 mg/dL — ABNORMAL HIGH (ref 70–99)
POTASSIUM: 3.9 meq/L (ref 3.7–5.3)
Potassium: 3.7 mEq/L (ref 3.7–5.3)
SODIUM: 127 meq/L — AB (ref 137–147)
SODIUM: 123 meq/L — AB (ref 137–147)

## 2014-04-26 LAB — T4, FREE: FREE T4: 1.29 ng/dL (ref 0.80–1.80)

## 2014-04-26 LAB — CBC
HCT: 35.3 % — ABNORMAL LOW (ref 36.0–46.0)
Hemoglobin: 12.4 g/dL (ref 12.0–15.0)
MCH: 32 pg (ref 26.0–34.0)
MCHC: 35.1 g/dL (ref 30.0–36.0)
MCV: 91.2 fL (ref 78.0–100.0)
PLATELETS: 202 10*3/uL (ref 150–400)
RBC: 3.87 MIL/uL (ref 3.87–5.11)
RDW: 12.9 % (ref 11.5–15.5)
WBC: 7.7 10*3/uL (ref 4.0–10.5)

## 2014-04-26 LAB — CREATININE, URINE, RANDOM: Creatinine, Urine: 15.7 mg/dL

## 2014-04-26 LAB — SODIUM, URINE, RANDOM: Sodium, Ur: 72 mEq/L

## 2014-04-26 MED ORDER — LEVOTHYROXINE SODIUM 137 MCG PO TABS: 137.0000 ug | ORAL_TABLET | Freq: Every day | ORAL | Status: DC

## 2014-04-26 MED ORDER — ASPIRIN 81 MG PO TBEC: 81.0000 mg | DELAYED_RELEASE_TABLET | Freq: Every day | ORAL | Status: DC

## 2014-04-26 MED ORDER — FUROSEMIDE 20 MG PO TABS: 20.0000 mg | ORAL_TABLET | ORAL | Status: DC

## 2014-04-26 NOTE — Progress Notes (Signed)
CARE MANAGEMENT NOTE 04/26/2014  Patient:  MIKESHA, COMP   Account Number:  [id number]  Date Initiated:  04/26/2014  Documentation initiated by:  [facility]  Subjective/Objective Assessment:     Action/Plan:   [place]   Anticipated DC Date:  04/26/2014   Anticipated DC Plan:  [place]  CM consult      [facility] Choice  HOME HEALTH   Choice offered to / List presented to:  C-3 Spouse        HH arranged  HH-1 RN      [place].   Status of service:  Completed, signed off Medicare Important Message given?  NA - LOS <3 / Initial given by admissions (If response is "NO", the following Medicare IM given date fields will be blank) Date Medicare IM given:   Medicare IM given by:   Date Additional Medicare IM given:   Additional Medicare IM given by:    Discharge Disposition:  [place]  Per UR Regulation:    If discussed at Long Length of Stay Meetings, dates discussed:    Comments:  04/26/2014 1030 NCM spoke to husband, Kyisha Denherder # [phone]. Offered choice for [facility]. Agreeable to [facility] for [facility] RN. Notified [facility] rep for [place] and scheduled dc today. Jonnie Finner RN CCM Case Mgmt phone [phone]

## 2014-04-26 NOTE — Discharge Summary (Addendum)
Physician Discharge Summary  Cassandra Ray [id number] DOB: 01-May-1923 DOA: 04/24/2014  PCP:  Melinda Crutch, MD  Admit date: 04/24/2014 Discharge date: 04/26/2014  Time spent: 45 minutes  Recommendations for Outpatient Follow-up:  Patient will be discharged with Home health nursing.  She is to continue taking her medications as prescribed.  Patient should also follow up with her primary care physician within one week of discharge. Patient should have her labs monitored within 1 week of discharge including a CBC and a BMP. Patient should also follow-up with her cardiologist, Dr. [name] within 1-2 weeks of discharge. Patient should follow a heart healthy diet with a 1500 mL fluid restriction per day. Patient may resume activity as tolerated.  This was all discussed with the patient and her husband, they do understand and agree.   Discharge Diagnoses:  Acute on chronic diastolic heart failure Hypertensive urgency Hypervolemic hyponatremia Hypothyroidism  Discharge Condition: Stable  Diet recommendation:  Heart healthy diet with 1500 mL fluid restriction per day  Filed Weights   04/25/14 0155 04/26/14 0505  Weight: 75.1 kg (165 lb 9.1 oz) 73.4 kg (161 lb 13.1 oz)    History of present illness:  on 04/25/2014 80 year old female with history of hypertension, hypothyroidism, atrial fibrillation, presented to the emergency department with complaints of shortness of breath for the past several days. She denied any chest pain. Patient does have a history of grade 3 diastolic CHF based on cardiology notes from March 2015 with preserved EF. Patient is not on diuretics at baseline. Per family, over the last several days patient has been having worsening wheezing and was started on albuterol. Family thinks that the patient is not been compliant with her medications. Patient presented to the emergency room and was found to be hypertensive with blood pressure 202/106. Patient reported taking her  medications as prescribed. In emergency department patient was given 40 mg of Lasix IV and Nitropaste. At the time of admission, her blood pressure was 163/78, troponin was normal, BNP elevated at 824. Chest x-ray showed evidence of pulmonary edema and cardia megaly. Hospitalist was called for admission for hypertensive urgency and heart failure exacerbation.  Hospital Course:  Acute on chronic diastolic heart failure -Per patient, shortness of breath has improved -Echocardiogram: EF Q000111Q, grade 2 diastolic dysfunction -Monitored daily weights, intake and output -Patient was initially placed on Lasix 40 mg IV twice a day. -She will be dishcarged with Lasix 20 mg every other day. -Patient has had good urine output since admission, approximately 6 L -Patient will need to follow-up with Dr. [name] upon discharge -Patient was counseled regarding daily weights, fluid restriction  Hypertensive urgency -Resolved, Continue metoprolol, lisinopril, Lasix  Hypervolemic Hyponatremia -Likely secondary to volume overload -Continue fluid restriction as well as diuresis -Currently no neurological complaints -Sodium slightly improved -Patient should have a repeat BMP within 1 week of discharge  Hypothyroidism -Continue Synthroid -TSH 16.43, FT4 pending -Synthroid dose increased however given patient's age, patient should follow-up with her primary care regarding this.  Code Status: DNR  Procedures: Echocardiogram: EF of Q000111Q, grade 2 diastolic dysfunction  Consultations: None  Discharge Exam: Filed Vitals:   04/26/14 0505  BP: 147/66  Pulse: 70  Temp: 98.4 F (36.9 C)  Resp: 18     General: Well developed, well nourished, NAD, appears stated age  HEENT: NCAT, mucous membranes moist.  Neck: Supple, no JVD, no masses  Cardiovascular: S1 S2 auscultated, no rubs, murmurs or gallops. Regular rate and rhythm.  Respiratory: Clear to  auscultation bilaterally with equal chest  rise  Abdomen: Soft, nontender, nondistended, + bowel sounds  Extremities: warm dry without cyanosis clubbing or edema  Neuro: AAOx3, no focal deficits.  Psych: Pleasant, appropriate mood and affect  Discharge Instructions      Discharge Instructions    ACE Inhibitor / ARB already ordered    Complete by:  As directed      Diet - low sodium heart healthy    Complete by:  As directed      Discharge instructions    Complete by:  As directed   Patient will be discharged with Home health nursing.  She is to continue taking her medications as prescribed.  Patient should also follow up with her primary care physician within one week of discharge. Patient should have her labs monitored within 1 week of discharge including a CBC and a BMP. Patient should also follow-up with her cardiologist, Dr. [name] within 1-2 weeks of discharge. Patient should follow a heart healthy diet with a 1500 mL fluid restriction per day. Patient may resume activity as tolerated.     Increase activity slowly    Complete by:  As directed             Medication List    TAKE these medications        ADVIL PM PO  Take 1 tablet by mouth at bedtime as needed (sleep).     albuterol 108 (90 BASE) MCG/ACT inhaler  Commonly known as:  PROVENTIL HFA;VENTOLIN HFA  Inhale 2 puffs into the lungs every 6 (six) hours as needed for wheezing or shortness of breath.     ALIGN PO  Take 1 tablet by mouth daily.     aspirin 81 MG EC tablet  Take 1 tablet (81 mg total) by mouth daily.     cholecalciferol 400 UNITS Tabs tablet  Commonly known as:  VITAMIN D  Take 1,200 Units by mouth daily.     CITRACAL + D PO  Take 1 tablet by mouth daily. 630 mg/ 400mg      famotidine-calcium carbonate-magnesium hydroxide 10-800-165 MG Chew chewable tablet  Commonly known as:  PEPCID COMPLETE  Chew 1 tablet by mouth daily as needed (indegestion).     furosemide 20 MG tablet  Commonly known as:  LASIX  Take 1 tablet (20 mg total)  by mouth every other day.     levothyroxine 137 MCG tablet  Commonly known as:  SYNTHROID  Take 1 tablet (137 mcg total) by mouth daily before breakfast.     lisinopril 10 MG tablet  Commonly known as:  PRINIVIL,ZESTRIL  Take 10 mg by mouth daily.     Melatonin 5 MG Tabs  Take 1 tablet by mouth at bedtime as needed (sleep).     metoprolol 100 MG tablet  Commonly known as:  LOPRESSOR  Take 100 mg by mouth 2 (two) times daily.     multivitamin tablet  Take 1 tablet by mouth daily.     MYRBETRIQ 50 MG Tb24 tablet  Generic drug:  mirabegron ER  Take 50 mg by mouth daily.     TYLENOL PO  Take 2 tablets by mouth every 6 (six) hours as needed (pain).       No Known Allergies Follow-up Information    Follow up with  Melinda Crutch, MD. Schedule an appointment as soon as possible for a visit in 1 week.   Specialty:  Family Medicine   Why:  Hospital followup, repeat CBC,  BMP   Contact information:   [address] [place] [address] [phone]       Follow up with Candee Furbish, MD. Schedule an appointment as soon as possible for a visit in 2 weeks.   Specialty:  Cardiology   Why:  Hospital followup, heart failure   Contact information:   [address] [place] [address] [phone]        The results of significant diagnostics from this hospitalization (including imaging, microbiology, ancillary and laboratory) are listed below for reference.    Significant Diagnostic Studies: Dg Chest 2 View  04/24/2014   CLINICAL DATA:  Wheezing and crackles in the lower lobes. Shortness of breath.  EXAM: CHEST  2 VIEW  COMPARISON:  02/03/2014  FINDINGS: Shallow inspiration. Mild cardiac enlargement with moderate pulmonary vascular congestion, increasing since prior study. Hazy interstitial changes in the lung bases likely represent early interstitial edema. Scattered calcified granulomas in the lungs. Surgical clips in the right breast and right axilla.  Degenerative changes throughout the thoracic spine with kyphoplasty changes in the upper lumbar spine.  IMPRESSION: Mild cardiac enlargement with pulmonary vascular congestion and interstitial edema, increasing since prior study.   Electronically Signed   By: Lucienne Capers M.D.   On: 04/24/2014 23:15    Microbiology: No results found for this or any previous visit (from the past 240 hour(s)).   Labs: Basic Metabolic Panel:  Recent Labs Lab 04/24/14 2235 04/25/14 0225 04/25/14 1015 04/25/14 1808 04/26/14 0158  NA 121* 124* 128* 126* 127*  K 3.9 3.6* 4.0 4.0 3.9  CL 84* 85* 85* 82* 85*  CO2 27 28 28 30 29   GLUCOSE 103* 101* 187* 119* 116*  BUN 17 17 14 22  26*  CREATININE 0.89 0.79 0.78 1.01 0.99  CALCIUM 9.1 8.9 8.5 9.1 8.8  MG  --  1.9  --   --   --    Liver Function Tests:  Recent Labs Lab 04/24/14 2235 04/25/14 0225  AST 22 23  ALT 10 10  ALKPHOS 76 75  BILITOT 0.3 0.3  PROT 7.0 6.8  ALBUMIN 3.4* 3.5   No results for input(s): LIPASE, AMYLASE in the last 168 hours. No results for input(s): AMMONIA in the last 168 hours. CBC:  Recent Labs Lab 04/24/14 2235 04/25/14 0225 04/26/14 0158  WBC 6.8 6.9 7.7  NEUTROABS  --  3.9  --   HGB 12.6 12.4 12.4  HCT 36.8 35.4* 35.3*  MCV 92.7 91.5 91.2  PLT 196 206 202   Cardiac Enzymes:  Recent Labs Lab 04/24/14 2235 04/25/14 0225 04/25/14 0700 04/25/14 1327  TROPONINI <0.30 <0.30 <0.30 <0.30   BNP: BNP (last 3 results)  Recent Labs  04/24/14 2235 04/25/14 0225  PROBNP 824.3* 977.6*   CBG: No results for input(s): GLUCAP in the last 168 hours.     SignedCristal Ford  [facility] 04/26/2014, 9:24 AM

## 2014-04-26 NOTE — Progress Notes (Signed)
Patient discharged home with husband, discharge instructions given and explained to patient/husband and they verbalized understanding, denies any pain/distress. Skin intact. No wound noted just bruises. Accompanied home by husband.

## 2014-04-26 NOTE — Plan of Care (Signed)
Problem: Phase I Progression Outcomes Goal: Pain controlled with appropriate interventions Outcome: Completed/Met Date Met:  04/26/14  Problem: Phase II Progression Outcomes Goal: Pain controlled Outcome: Completed/Met Date Met:  04/26/14 Goal: Tolerating diet Outcome: Completed/Met Date Met:  04/26/14

## 2014-04-26 NOTE — Discharge Instructions (Signed)

## 2014-05-05 ENCOUNTER — Ambulatory Visit (INDEPENDENT_AMBULATORY_CARE_PROVIDER_SITE_OTHER): Payer: Medicare Other | Admitting: Cardiology

## 2014-05-05 ENCOUNTER — Encounter: Payer: Self-pay | Admitting: Cardiology

## 2014-05-05 VITALS — BP 122/82 | HR 65 | Ht 64.0 in | Wt 165.0 lb

## 2014-05-05 DIAGNOSIS — I10 Essential (primary) hypertension: Secondary | ICD-10-CM

## 2014-05-05 DIAGNOSIS — I5032 Chronic diastolic (congestive) heart failure: Secondary | ICD-10-CM

## 2014-05-05 DIAGNOSIS — I480 Paroxysmal atrial fibrillation: Secondary | ICD-10-CM

## 2014-05-05 NOTE — Progress Notes (Signed)
[facility]. [address], [place]  [address] Phone: [phone] Fax:  [phone]  Date:  05/05/2014   ID:  Cassandra Ray, DOB Sep 14, 1922, MRN [id number]  PCP:   Melinda Crutch, MD   History of Present Illness: Cassandra Ray is a 80 y.o. female with dyspnea, grade 3 diastolic dysfunction, normal ejection fraction, hypertension here for followup. She was hospitalized on 04/25/14 with shortness of breath. She was quite hypertensive 202/106. She was given IV Lasix, Nitropaste, proBNP was elevated to 824, chest x-ray showed pulmonary edema. She also had hyponatremia. She was eventually discharged on Lasix 20 mg every other day. Echocardiogram 04/25/14 showed EF of [id number], grade 2 diastolic dysfunction. Overall she is feeling much better.  In November of 2014 was hospitalized in [place]. Blood pressure medication was increased. Hyponatremia has been chronic. She underwent a stress test which showed no evidence of ischemia. Low risk. Normal EF. Echocardiogram showed EF of 55-60% with mild LVH, mild pulmonary hypertension, trace TR. I reviewed her discharge summary, she was visiting [place] and felt weak, vomiting, nausea. This began after eating seafood. She was diagnosed with A. fib and RVR. IV Lopressor. As well as fluids. She converted to normal rhythm. Abdominal ultrasound showed an incidental finding of pancreatic cyst with recommendations to repeat in 6-12 months. Her Toprol regimen was continued however lisinopril 10 mg was substituted for Norvasc. I have reviewed emergency room stress and it is challenging for me to state clearly that she had atrial fibrillation. There do appear to be P waves present. Difficult to determine.  She underwent event monitor through December/January 2014/2015 which showed no evidence of atrial fibrillation. Excellent. She's had no further palpitations. Main complaint is knee pain. Minor lower extremity swelling.  Wt Readings from Last 3  Encounters:  05/05/14 165 lb (74.844 kg)  04/26/14 161 lb 13.1 oz (73.4 kg)  09/17/13 152 lb (68.947 kg)     Past Medical History  Diagnosis Date  . HTN (hypertension)   . Hypothyroidism   . Insomnia   . Actinic keratoses   . Atrial fibrillation     "only one time"  . Arthritis     back   . Cancer     Breast cancer (right)  . Compression fracture of L3 lumbar vertebra   . GERD (gastroesophageal reflux disease)     takes otc Pepcid as needed    Past Surgical History  Procedure Laterality Date  . Breast lumpectomy Right 1995    lumpectomy  . Tonsillectomy    . Colonoscopy    . Eye surgery Bilateral     cataract w/ lens implant  . Kyphoplasty N/A 09/18/2013    Procedure: KYPHOPLASTY;  Surgeon: Sinclair Ship, MD;  Location: [facility];  Service: Orthopedics;  Laterality: N/A;  Lumbar 3 kyphoplasty    Current Outpatient Prescriptions  Medication Sig Dispense Refill  . aspirin EC 81 MG EC tablet Take 1 tablet (81 mg total) by mouth daily.    . Calcium Citrate-Vitamin D (CITRACAL + D PO) Take 1 tablet by mouth daily. 630 mg/ 400mg     . cholecalciferol (VITAMIN D) 400 UNITS TABS tablet Take 1,200 Units by mouth daily.     . DULoxetine (CYMBALTA) 30 MG capsule     . furosemide (LASIX) 20 MG tablet Take 1 tablet (20 mg total) by mouth every other day. 30 tablet 0  . lisinopril (PRINIVIL,ZESTRIL) 10 MG tablet Take 10 mg by  mouth daily.     . Magnesium 250 MG TABS Take 250 mg by mouth daily.    . Melatonin 5 MG TABS Take 1 tablet by mouth at bedtime as needed (sleep).    . metoprolol (LOPRESSOR) 100 MG tablet Take 100 mg by mouth 2 (two) times daily.    . mirabegron ER (MYRBETRIQ) 50 MG TB24 tablet Take 50 mg by mouth daily.    . Multiple Vitamin (MULTIVITAMIN) tablet Take 1 tablet by mouth daily.    . Probiotic Product (ALIGN PO) Take 1 tablet by mouth daily.    Marland Kitchen SYNTHROID 125 MCG tablet      No current facility-administered medications for this visit.    Allergies:   No  Known Allergies  Social History:  The patient  reports that she has never smoked. She has never used smokeless tobacco. She reports that she drinks about 4.2 oz of alcohol per week. She reports that she does not use illicit drugs.   ROS:  Please see the history of present illness.   No syncope, no fevers. Positive back pain. No chest pain.    PHYSICAL EXAM: VS:  BP 122/82 mmHg  Pulse 65  Ht 5\' 4"  (1.626 m)  Wt 165 lb (74.844 kg)  BMI 28.31 kg/m2 Well nourished, well developed, in no acute distress HEENT: normal Neck: no JVD Cardiac:  normal S1, S2; RRR; no murmur Lungs:  clear to auscultation bilaterally, no wheezing, rhonchi or rales Abd: soft, nontender, no hepatomegaly Ext: trace edema Skin: warm and dry Neuro: no focal abnormalities noted  EKG:  Prior EKGs from hospitalization reviewed. P waves are present however challenging to make a definitive diagnosis of atrial fibrillation. Intervals appear quite normal. Could be PAT?    Echo: 4/14: 1. There is moderate asymmetric left ventricle hypertrophy. 2. Left ventricular ejection fraction estimated by 2D at 55-60 percent. 3. There were no regional wall motion abnormalities. 4. There is mild tricuspid regurgitation. 5. Mildly elevated estimated right ventricular systolic pressure. 6. Mild calcification of the aortic valve. 7. Analysis of mitral valve inflow, pulmonary vein Doppler and tissue Doppler suggests grade III diastolic dysfunction with elevated left atrial pressures.   ASSESSMENT AND PLAN:  1. Diastolic heart failure-grade 2 diastolic dysfunction, normal ejection fraction. Responded to IV diuresis. Hyponatremia appears to be chronic. Low-dose Lasix every other day. She understands that if her weight gains by 3-5 pounds she may take an extra Lasix dose. Overall she is doing well currently. 2. Possible atrial fibrillation paroxysmal- Event monitor was reassuring. No evidence of atrial fibrillation over the month of  December/January. She states that she did not realize that she was in atrial fibrillation when she went to the emergency room. They did not start her on anticoagulation in the hospital which may lead me to believe that there could of been question in the diagnosis of atrial fibrillation as well or perhaps it was not started because of short episode. Stress test, echocardiogram reassuring. Excellent. 3. Hypertension-improved today. Continue with current blood pressure changes. Agree with metoprolol as well as ACE inhibitor given her current condition. 4. Knee pain-currently bothering her. Arthritic pain. 5. Minor edema lower extremities-try compression hose. 6. We will see back in 4 months.  Signed, Candee Furbish, MD [facility]  05/05/2014 2:49 PM

## 2014-05-05 NOTE — Patient Instructions (Signed)
The current medical regimen is effective;  continue present plan and medications.  Follow up in 2 months with Dr [name].

## 2014-05-06 ENCOUNTER — Ambulatory Visit: Payer: Medicare Other

## 2014-05-07 ENCOUNTER — Ambulatory Visit
Admission: RE | Admit: 2014-05-07 | Discharge: 2014-05-07 | Disposition: A | Discharge status: Home / Self Care | Payer: Medicare Other | Source: Ambulatory Visit

## 2014-05-07 DIAGNOSIS — Z1231 Encounter for screening mammogram for malignant neoplasm of breast: Secondary | ICD-10-CM

## 2014-07-08 ENCOUNTER — Ambulatory Visit (INDEPENDENT_AMBULATORY_CARE_PROVIDER_SITE_OTHER): Payer: Medicare Other | Admitting: Cardiology

## 2014-07-08 ENCOUNTER — Encounter: Payer: Self-pay | Admitting: Cardiology

## 2014-07-08 VITALS — BP 138/82 | HR 72 | Ht 64.0 in | Wt 166.0 lb

## 2014-07-08 DIAGNOSIS — E871 Hypo-osmolality and hyponatremia: Secondary | ICD-10-CM

## 2014-07-08 DIAGNOSIS — R0602 Shortness of breath: Secondary | ICD-10-CM

## 2014-07-08 DIAGNOSIS — I10 Essential (primary) hypertension: Secondary | ICD-10-CM

## 2014-07-08 NOTE — Patient Instructions (Signed)
The current medical regimen is effective;  continue present plan and medications.  Follow up in 6 months with Dr. Skains.  You will receive a letter in the mail 2 months before you are due.  Please call us when you receive this letter to schedule your follow up appointment.  Thank you for choosing  [facility]!!     

## 2014-07-08 NOTE — Progress Notes (Signed)
[facility]. [address], [place]  [address] Phone: [phone] Fax:  [phone]  Date:  07/08/2014   ID:  Cassandra Ray, DOB [birth year], MRN [id number]  PCP:   Melinda Crutch, MD   History of Present Illness: Cassandra Ray is a 81 y.o. female with dyspnea, grade 3 diastolic dysfunction, normal ejection fraction, hypertension here for followup. She was hospitalized on 04/25/14 with shortness of breath. She was quite hypertensive 202/106. She was given IV Lasix, Nitropaste, proBNP was elevated to 824, chest x-ray showed pulmonary edema. She also had hyponatremia. She was eventually discharged on Lasix 20 mg every other day. Echocardiogram 04/25/14 showed EF of [id number], grade 2 diastolic dysfunction. Overall she is feeling much better.  In November of 2014 was hospitalized in [place]. Blood pressure medication was increased. Hyponatremia has been chronic. She underwent a stress test which showed no evidence of ischemia. Low risk. Normal EF. Echocardiogram showed EF of 55-60% with mild LVH, mild pulmonary hypertension, trace TR. I reviewed her discharge summary, she was visiting [place] and felt weak, vomiting, nausea. This began after eating seafood. She was diagnosed with A. fib and RVR. IV Lopressor. As well as fluids. She converted to normal rhythm. Abdominal ultrasound showed an incidental finding of pancreatic cyst with recommendations to repeat in 6-12 months. Her Toprol regimen was continued however lisinopril 10 mg was substituted for Norvasc. I have reviewed emergency room stress and it is challenging for me to state clearly that she had atrial fibrillation. There do appear to be P waves present. Difficult to determine.  She underwent event monitor through December/January 2014/2015 which showed no evidence of atrial fibrillation. Excellent. She's had no further palpitations. Main complaint is knee pain. Minor lower extremity swelling.  In late January 2016,  she ended up having a bronchitis episode, wheezing, was treated from a pulmonary perspective, prednisone, beta agonist and has done quite well. Her oxygen saturation is now 96. No further wheezing. She is feeling better. Her weight has increased slightly but she does not show any signs of fluid overload currently.  Wt Readings from Last 3 Encounters:  07/08/14 166 lb (75.297 kg)  05/05/14 165 lb (74.844 kg)  04/26/14 161 lb 13.1 oz (73.4 kg)     Past Medical History  Diagnosis Date  . HTN (hypertension)   . Hypothyroidism   . Insomnia   . Actinic keratoses   . Atrial fibrillation     "only one time"  . Arthritis     back   . Cancer     Breast cancer (right)  . Compression fracture of L3 lumbar vertebra   . GERD (gastroesophageal reflux disease)     takes otc Pepcid as needed    Past Surgical History  Procedure Laterality Date  . Breast lumpectomy Right 1995    lumpectomy  . Tonsillectomy    . Colonoscopy    . Eye surgery Bilateral     cataract w/ lens implant  . Kyphoplasty N/A 09/18/2013    Procedure: KYPHOPLASTY;  Surgeon: Sinclair Ship, MD;  Location: [facility];  Service: Orthopedics;  Laterality: N/A;  Lumbar 3 kyphoplasty    Current Outpatient Prescriptions  Medication Sig Dispense Refill  . aspirin EC 81 MG EC tablet Take 1 tablet (81 mg total) by mouth daily.    . Calcium Citrate-Vitamin D (CITRACAL + D PO) Take 1 tablet by mouth daily. 630 mg/ 400mg     . cholecalciferol (VITAMIN  D) 400 UNITS TABS tablet Take 1,200 Units by mouth daily.     . DULoxetine (CYMBALTA) 30 MG capsule Take 30 mg by mouth daily.     . furosemide (LASIX) 20 MG tablet Take 1 tablet (20 mg total) by mouth every other day. 30 tablet 0  . lisinopril (PRINIVIL,ZESTRIL) 10 MG tablet Take 10 mg by mouth daily.     . Magnesium 250 MG TABS Take 250 mg by mouth daily.    . Melatonin 5 MG TABS Take 1 tablet by mouth at bedtime as needed (sleep).    . metoprolol (LOPRESSOR) 100 MG tablet Take 100  mg by mouth 2 (two) times daily.    . mirabegron ER (MYRBETRIQ) 50 MG TB24 tablet Take 50 mg by mouth daily.    . Multiple Vitamin (MULTIVITAMIN) tablet Take 1 tablet by mouth daily.    . Probiotic Product (ALIGN PO) Take 1 tablet by mouth daily.    Marland Kitchen SYNTHROID 125 MCG tablet Take 125 mcg by mouth daily before breakfast.      No current facility-administered medications for this visit.    Allergies:   No Known Allergies  Social History:  The patient  reports that she has never smoked. She has never used smokeless tobacco. She reports that she drinks about 4.2 oz of alcohol per week. She reports that she does not use illicit drugs.   ROS:  Please see the history of present illness.   No syncope, no fevers. Positive back pain. No chest pain.    PHYSICAL EXAM: VS:  BP 138/82 mmHg  Pulse 72  Ht 5\' 4"  (1.626 m)  Wt 166 lb (75.297 kg)  BMI 28.48 kg/m2 Well nourished, well developed, in no acute distress HEENT: normal Neck: no JVD Cardiac:  normal S1, S2; RRR; no murmur Lungs:  clear to auscultation bilaterally, no wheezing, rhonchi or rales Abd: soft, nontender, no hepatomegaly Ext: trace edema Skin: warm and dry Neuro: no focal abnormalities noted  EKG:  Prior EKGs from hospitalization reviewed. P waves are present however challenging to make a definitive diagnosis of atrial fibrillation. Intervals appear quite normal. Could be PAT?    Echo: 4/14: 1. There is moderate asymmetric left ventricle hypertrophy. 2. Left ventricular ejection fraction estimated by 2D at 55-60 percent. 3. There were no regional wall motion abnormalities. 4. There is mild tricuspid regurgitation. 5. Mildly elevated estimated right ventricular systolic pressure. 6. Mild calcification of the aortic valve. 7. Analysis of mitral valve inflow, pulmonary vein Doppler and tissue Doppler suggests grade III diastolic dysfunction with elevated left atrial pressures.   ASSESSMENT AND PLAN:  1. Bronchitis-recent  bronchitis episode, responded well to bronchodilators, prednisone. She is much improved. Her oxygen saturations at one point were 90. Currently 96. She feels baseline. 2. Diastolic heart failure-grade 2 diastolic dysfunction, normal ejection fraction. Responded to IV diuresis. Hyponatremia appears to be chronic. Low-dose Lasix every other day. She understands that if her weight gains by 3-5 pounds she may take an extra Lasix dose. Overall she is doing well currently. Not terribly interested in taking increased Lasix at this time. 3. Possible atrial fibrillation paroxysmal- Event monitor was reassuring. No evidence of atrial fibrillation over the month of December/January. She states that she did not realize that she was in atrial fibrillation when she went to the emergency room. They did not start her on anticoagulation in the hospital which may lead me to believe that there could of been question in the diagnosis of atrial fibrillation  as well or perhaps it was not started because of short episode. Stress test, echocardiogram reassuring. Excellent. 4. Hypertension-improved today. Continue with current blood pressure changes. Agree with metoprolol as well as ACE inhibitor given her current condition. 5. Knee pain-currently bothering her. Arthritic pain. 6. Minor edema lower extremities-try compression hose. 7. We will see back in 6 months.  Signed, Candee Furbish, MD [facility]  07/08/2014 3:16 PM

## 2014-11-11 ENCOUNTER — Telehealth: Payer: Self-pay | Admitting: Internal Medicine

## 2014-11-11 NOTE — Telephone Encounter (Signed)
Spoke with patient's husband and offered OV today with extender. Patient could not come and will be out of town next week. Scheduled on 11/24/14 at 1:15 PM with Cecille Rubin Hvozdovic, PA-C.

## 2014-11-24 ENCOUNTER — Encounter: Payer: Self-pay | Admitting: Physician Assistant

## 2014-11-24 ENCOUNTER — Ambulatory Visit (INDEPENDENT_AMBULATORY_CARE_PROVIDER_SITE_OTHER): Payer: Medicare Other | Admitting: Physician Assistant

## 2014-11-24 VITALS — BP 118/82 | HR 83 | Ht 64.0 in | Wt 169.0 lb

## 2014-11-24 DIAGNOSIS — R1314 Dysphagia, pharyngoesophageal phase: Secondary | ICD-10-CM | POA: Diagnosis not present

## 2014-11-24 DIAGNOSIS — K219 Gastro-esophageal reflux disease without esophagitis: Secondary | ICD-10-CM

## 2014-11-24 NOTE — Progress Notes (Signed)
reviewed and agree with Barium study  To start with.

## 2014-11-24 NOTE — Patient Instructions (Addendum)
Take the Pantoprazole sodium 40 mg 30 minutes before breakfast. We have given you antireflux information.  You have been scheduled for an endoscopy. Please follow written instructions given to you at your visit today. If you use inhalers (even only as needed), please bring them with you on the day of your procedure. You have been scheduled for a Barium Esophogram at [facility] (1st floor of the hospital) on Friday 11-27-2014 at  11:00 am . Please arrive at 10:45 am to your appointment for registration. Make certain not to have anything to eat or drink 3  hours prior to your test. If you need to reschedule for any reason, please contact radiology at [phone] to do so. __________________________________________________________________ A barium swallow is an examination that concentrates on views of the esophagus. This tends to be a double contrast exam (barium and two liquids which, when combined, create a gas to distend the wall of the oesophagus) or single contrast (non-ionic iodine based). The study is usually tailored to your symptoms so a good history is essential. Attention is paid during the study to the form, structure and configuration of the esophagus, looking for functional disorders (such as aspiration, dysphagia, achalasia, motility and reflux) EXAMINATION You may be asked to change into a gown, depending on the type of swallow being performed. A radiologist and radiographer will perform the procedure. The radiologist will advise you of the type of contrast selected for your procedure and direct you during the exam. You will be asked to stand, sit or lie in several different positions and to hold a small amount of fluid in your mouth before being asked to swallow while the imaging is performed .In some instances you may be asked to swallow barium coated marshmallows to assess the motility of a solid food bolus. The exam can be recorded as a digital or video fluoroscopy  procedure. POST PROCEDURE It will take 1-2 days for the barium to pass through your system. To facilitate this, it is important, unless otherwise directed, to increase your fluids for the next 24-48hrs and to resume your normal diet.  This test typically takes about 30 minutes to perform. __________________________________________________________________________________

## 2014-11-24 NOTE — Progress Notes (Signed)
Patient ID: Cassandra Ray, female   DOB: 08/31/1922, 81 y.o.   MRN: [id number]    HPI:  Cassandra Ray is a 81 y.o.   female referred by Lona Kettle, MD for evaluation of dysphagia. She has a rather complex medical history including dyspnea, grade 3 diastolic dysfunction, normal ejection fraction, hypertension, diastolic CHF. She also has a history of breast cancer diagnosed in 1996 for which she had a right lumpectomy followed by radiation. Review of her chart shows she was hospitalized in November 2015 with shortness of breath and hypertension. Pro-BNP was elevated to 824 chest x-ray showed pulmonary edema she was treated with IV Lasix and nitro paste. Echocardiogram on 04/25/2014 showed ejection fraction of [id number], grade 2 diastolic dysfunction. In November 2014 she was hospitalized in [place]. Blood pressure was elevated. She underwent a stress test that showed no evidence for ischemia echocardiogram showed ejection fraction of 55-60% with mild LVH, mild pulmonary hypertension, trace tricuspid regurg. She was diagnosed with A. fib with RVR . She was treated with IV Lopressor and converted to normal rhythm. Review of her chart shows she also had an abdominal ultrasound in [place] that showed an incidental finding of pancreatic cyst with recommendations to repeat it in 6-12 months.  A sheet is known to Dr. Maurene Capes from prior colonoscopy in February 2006 at which time she was noted to have diverticulosis but no polyps. She is here today with complaints of excess phlegm in her throat and frequent heartburn. She states frequently when she is eating near the end of the meal she feels she has excess mucus in her chest and it comes up in her throat. She has been feeling very hoarse in her throat feels irritated. She gets heartburn on a daily basis. She was using toms but about 6 weeks ago was started on pantoprazole by her primary care provider. She readily admits that most A she  forgets to take it. She reports that when she eats she feels as if food sticks in her throat. She does not feel as if it once to go down the wrong pipe and she does not cough and sputter while eating. She denies nocturnal read her dictation and she denies nocturnal cough. Her appetite has been good and she has no early satiety. Her weight has been stable.   Past Medical History  Diagnosis Date  . HTN (hypertension)   . Hypothyroidism   . Insomnia   . Actinic keratoses   . Atrial fibrillation     "only one time"  . Arthritis     back   . Cancer     Breast cancer (right)  . Compression fracture of L3 lumbar vertebra   . GERD (gastroesophageal reflux disease)     takes otc Pepcid as needed    Past Surgical History  Procedure Laterality Date  . Breast lumpectomy Right 1995    lumpectomy  . Tonsillectomy    . Colonoscopy    . Eye surgery Bilateral     cataract w/ lens implant  . Kyphoplasty N/A 09/18/2013    Procedure: KYPHOPLASTY;  Surgeon: Sinclair Ship, MD;  Location: [facility];  Service: Orthopedics;  Laterality: N/A;  Lumbar 3 kyphoplasty   Family History  Problem Relation Age of Onset  . CAD Mother   . Diabetes Mellitus II Mother   . Heart failure Father   . Prostate cancer Brother   . Kidney cancer Brother    History  Substance  Use Topics  . Smoking status: Never Smoker   . Smokeless tobacco: Never Used  . Alcohol Use: 4.2 oz/week    7 Glasses of wine per week     Comment: 1 glass of wine a day   Current Outpatient Prescriptions  Medication Sig Dispense Refill  . aspirin EC 81 MG EC tablet Take 1 tablet (81 mg total) by mouth daily.    Marland Kitchen b complex vitamins tablet Take 1 tablet by mouth daily.    . Calcium Citrate-Vitamin D (CITRACAL + D PO) Take 1 tablet by mouth daily. 630 mg/ 400mg     . cholecalciferol (VITAMIN D) 400 UNITS TABS tablet Take 1,200 Units by mouth daily.     . DULoxetine (CYMBALTA) 30 MG capsule Take 30 mg by mouth daily.     . furosemide  (LASIX) 20 MG tablet Take 1 tablet (20 mg total) by mouth every other day. 30 tablet 0  . lisinopril (PRINIVIL,ZESTRIL) 10 MG tablet Take 10 mg by mouth daily.     Marland Kitchen losartan (COZAAR) 50 MG tablet Take 50 mg by mouth daily.    . Magnesium 250 MG TABS Take 250 mg by mouth daily.    . Melatonin 5 MG TABS Take 1 tablet by mouth at bedtime as needed (sleep).    . metoprolol (LOPRESSOR) 100 MG tablet Take 100 mg by mouth 2 (two) times daily.    . mirabegron ER (MYRBETRIQ) 50 MG TB24 tablet Take 50 mg by mouth daily.    . Multiple Vitamin (MULTIVITAMIN) tablet Take 1 tablet by mouth daily.    . pantoprazole (PROTONIX) 40 MG tablet Take 40 mg by mouth daily.    . Probiotic Product (ALIGN PO) Take 1 tablet by mouth daily.    Marland Kitchen SYNTHROID 125 MCG tablet Take 125 mcg by mouth daily before breakfast.      No current facility-administered medications for this visit.   No Known Allergies   Review of Systems: Gen: Denies any fever, chills, sweats, anorexia, fatigue, weakness, malaise, weight loss, and sleep disorder CV: Denies chest pain, angina, palpitations, syncope, orthopnea, PND, peripheral edema, and claudication. Resp: Denies dyspnea at rest, dyspnea with exercise, cough, sputum, wheezing, coughing up blood, and pleurisy. GI: Denies vomiting blood, jaundice, and fecal incontinence. Has dysphagia to solids. GU : Denies urinary burning, blood in urine, urinary frequency, urinary hesitancy, nocturnal urination, and urinary incontinence. MS: Denies joint pain, limitation of movement, and swelling, stiffness, low back pain, extremity pain. Denies muscle weakness, cramps, atrophy.  Derm: Denies rash, itching, dry skin, hives, moles, warts, or unhealing ulcers.  Psych: Denies depression, anxiety, memory loss, suicidal ideation, hallucinations, paranoia, and confusion. Heme: Denies bruising, bleeding, and enlarged lymph nodes. Neuro:  Denies any headaches, dizziness, paresthesias. Endo:  Denies any  problems with DM, thyroid, adrenal function    Prior Endoscopies:  Colonoscopy 08/02/2004 revealed diverticulosis but no recurrent polyps.  Physical Exam: BP 118/82 mmHg  Pulse 83  Ht 5\' 4"  (1.626 m)  Wt 169 lb (76.658 kg)  BMI 28.99 kg/m2 Constitutional: Pleasant, elderly, white female in no acute distress. HEENT: Normocephalic and atraumatic. Conjunctivae are normal. No scleral icterus. Neck supple. No cervical adenopathy Cardiovascular: Normal rate, regular rhythm.  Pulmonary/chest: Effort normal and breath sounds normal. No wheezing. Abdominal: Soft, nondistended, nontender. Bowel sounds active throughout. There are no masses palpable. No hepatomegaly. Extremities: no edema Lymphadenopathy: No cervical adenopathy noted. Neurological: Alert and oriented to person place and time. Skin: Skin is warm and dry. No rashes  noted. Psychiatric: Normal mood and affect. Behavior is normal.  ASSESSMENT AND PLAN: 81 year old female with a 2 month history of dysphagia and a several month history of frequent heartburn referred for evaluation. An antireflux regimen has been reviewed. She has been instructed to use her pantoprazole 40 mg by mouth every morning 30 minutes prior to breakfast on a regular basis. She will be scheduled for repeat barium swallow with tablet to evaluate for possible stricture, esophageal wall thickening, dysmotility, etc. She will then be scheduled for an EGD to evaluate for esophagitis, gastritis, ulcer, etc.The risks, benefits, and alternatives to endoscopy with possible biopsy and possible dilation were discussed with the patient and they consent to proceed. We will also contact the patient to see if she has indeed had a follow-up ultrasound for reevaluation of the pancreatic cyst, and if she has not we will schedule this as well. Follow-up recommendations will be made pending the findings of the above.    Mady Oubre, Deloris Ping 11/24/2014, 2:46 PM  CC: Lona Kettle,  MD

## 2014-11-25 ENCOUNTER — Telehealth: Payer: Self-pay | Admitting: *Deleted

## 2014-11-25 DIAGNOSIS — R935 Abnormal findings on diagnostic imaging of other abdominal regions, including retroperitoneum: Secondary | ICD-10-CM

## 2014-11-25 DIAGNOSIS — K869 Disease of pancreas, unspecified: Secondary | ICD-10-CM

## 2014-11-25 NOTE — Telephone Encounter (Signed)
Called and advised patiet's husband we scheduled the Ultrasound at [facility] radiology.  Date is 12-04-2014. She is to arrive at 9:45 am . She is to have nothing by mouth past 4:00 am in the morning.  ( 6 hours prior to the test).  I gave Mr. Schildt the number for radiology at [phone] in the event they would need to reschedule. I did offer him to call me also if his wife needs to reschedule.

## 2014-11-25 NOTE — Telephone Encounter (Signed)
-----   Message from Vita Barley Hvozdovic, PA-C sent at 11/24/2014  2:55 PM EDT ----- Patient had an abdominal ultrasound in [place] in November 2014 that showed a pancreatic cyst. She was advised to have a repeat ultrasound 6-12 months from that to be sure the cyst had not changed. I do not see in epic that she has had this done. Could to please contact her to make sure this has been done? If it has not been done please schedule an abdominal ultrasound with history pancreatic cyst.

## 2014-11-27 ENCOUNTER — Ambulatory Visit (HOSPITAL_COMMUNITY)
Admission: RE | Admit: 2014-11-27 | Discharge: 2014-11-27 | Disposition: A | Discharge status: Home / Self Care | Payer: Medicare Other | Source: Ambulatory Visit | Attending: Physician Assistant | Admitting: Physician Assistant

## 2014-11-27 DIAGNOSIS — K449 Diaphragmatic hernia without obstruction or gangrene: Secondary | ICD-10-CM

## 2014-11-27 DIAGNOSIS — Q396 Congenital diverticulum of esophagus: Secondary | ICD-10-CM | POA: Insufficient documentation

## 2014-11-27 DIAGNOSIS — R1310 Dysphagia, unspecified: Secondary | ICD-10-CM | POA: Diagnosis present

## 2014-11-27 DIAGNOSIS — R1314 Dysphagia, pharyngoesophageal phase: Secondary | ICD-10-CM

## 2014-11-27 DIAGNOSIS — K219 Gastro-esophageal reflux disease without esophagitis: Secondary | ICD-10-CM

## 2014-11-27 HISTORY — DX: Diaphragmatic hernia without obstruction or gangrene: K44.9

## 2014-11-27 IMAGING — RF DG ESOPHAGUS
9 of 14 series · 14 of 24 positions shown · non-contrast
Comparison: None.

CLINICAL DATA: Dysphagia.

EXAM:
ESOPHOGRAM / BARIUM SWALLOW / BARIUM TABLET STUDY
TECHNIQUE: Combined double contrast and single contrast examination performed
using effervescent crystals, thick barium liquid, and thin barium
liquid. The patient was observed with fluoroscopy swallowing a 13 mm
barium sulphate tablet.
FLUOROSCOPY TIME:  Fluoroscopy Time:  0 minutes 38 seconds

[Series 1: run · 2 of 26 slices shown (1 of 9)]
[im 1/26]
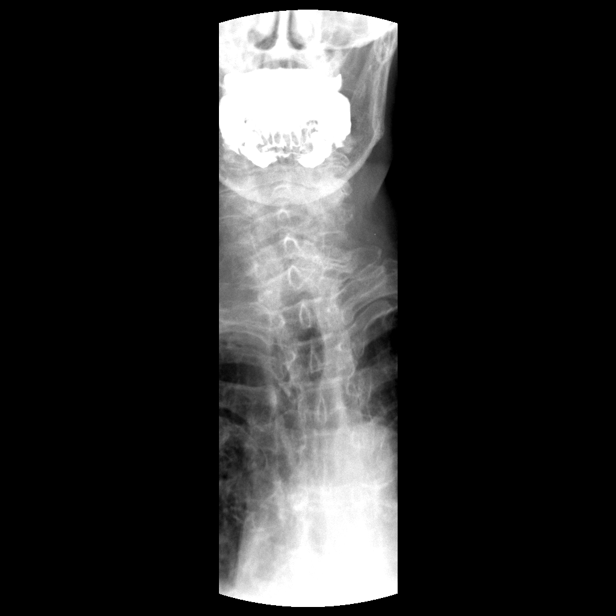
[im 17/26]
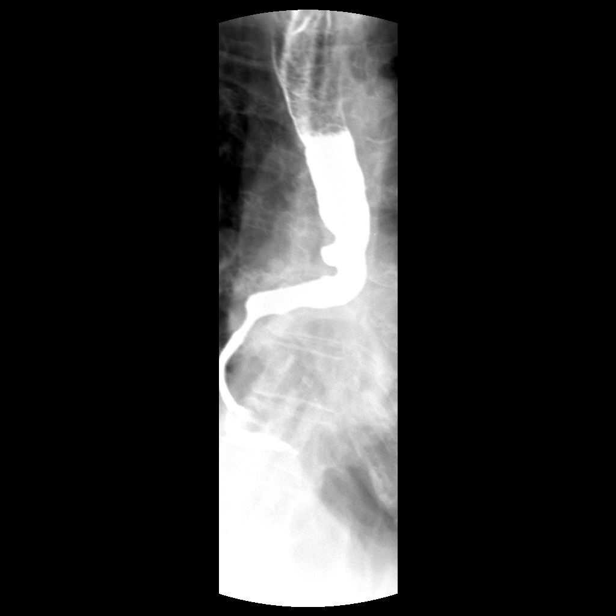

[Series 1: run · 3 of 26 slices shown (2 of 9)]
[im 1/26]
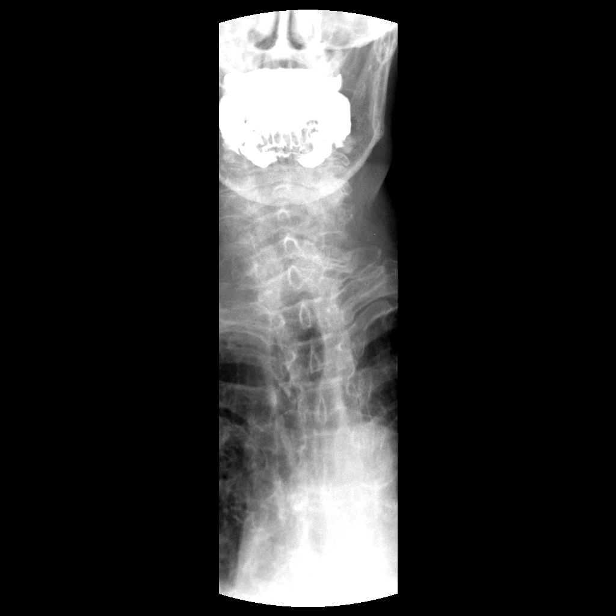
[im 17/26]
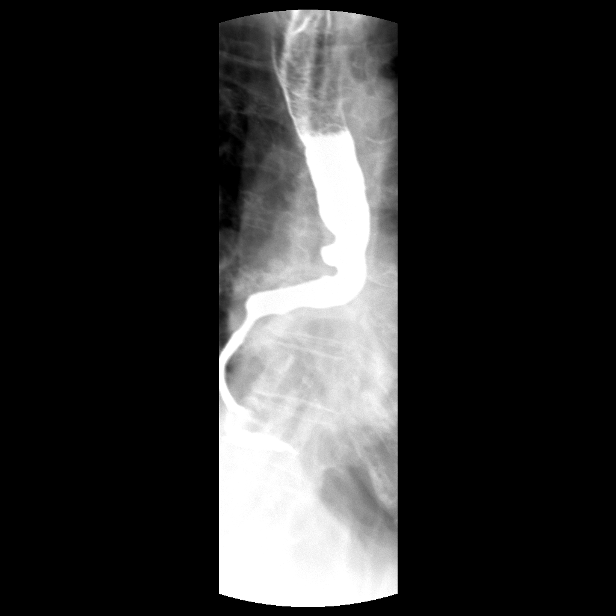
[im 26/26]
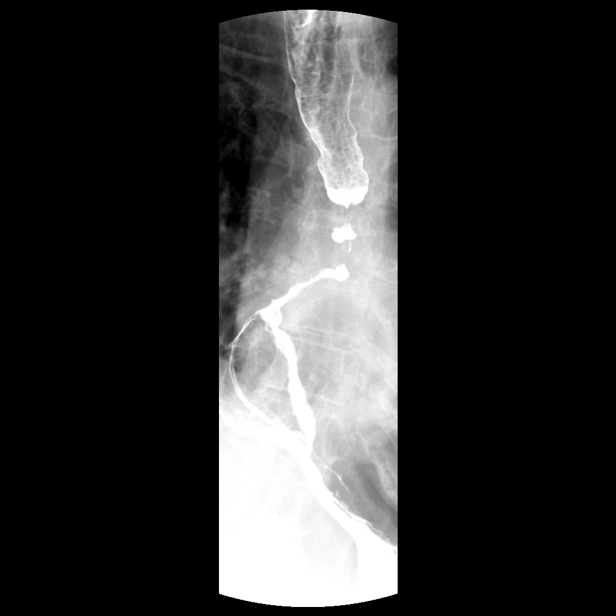

[Series 2: run · 1 of 1 slices shown (3 of 9)]
[im 1/1]
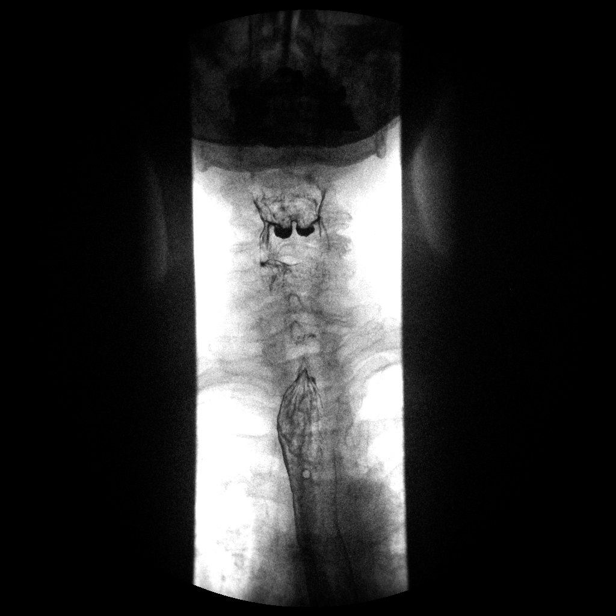

[Series 3: run · 1 of 1 slices shown (4 of 9)]
[im 1/1]
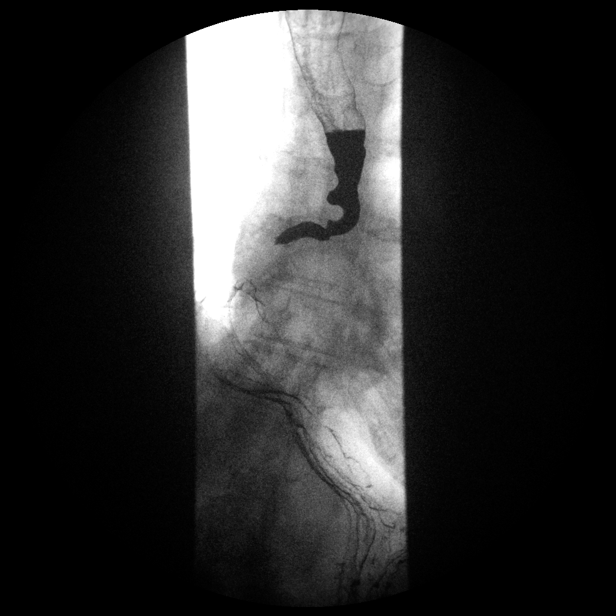

[Series 4: run · 1 of 1 slices shown (5 of 9)]
[im 1/1]
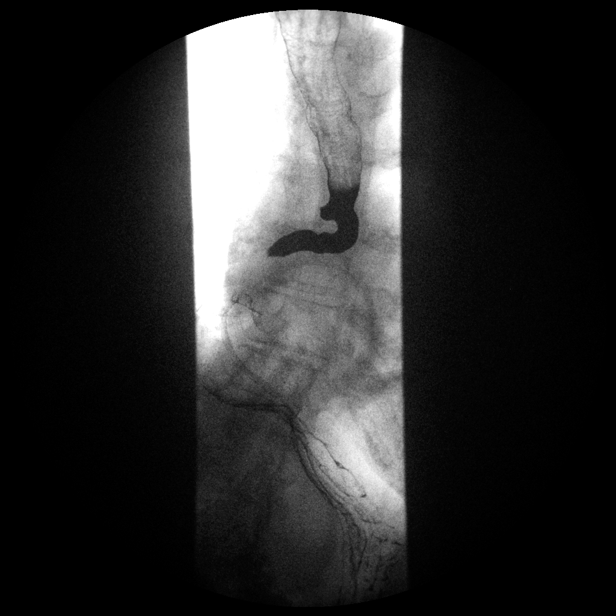

[Series 5: run · 2 of 25 slices shown (6 of 9)]
[im 1/25]
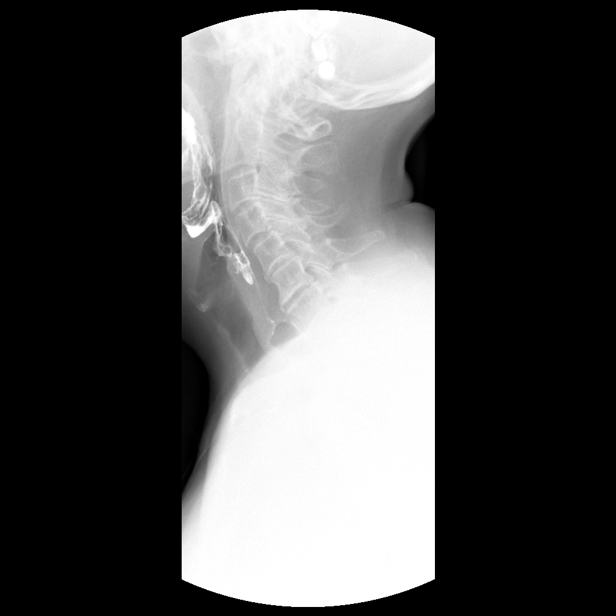
[im 25/25]
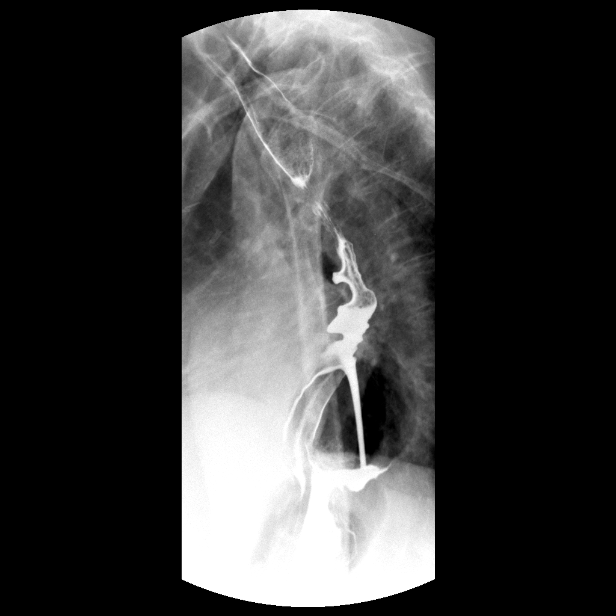

[Series 5: run · 2 of 25 slices shown (7 of 9)]
[im 13/25]
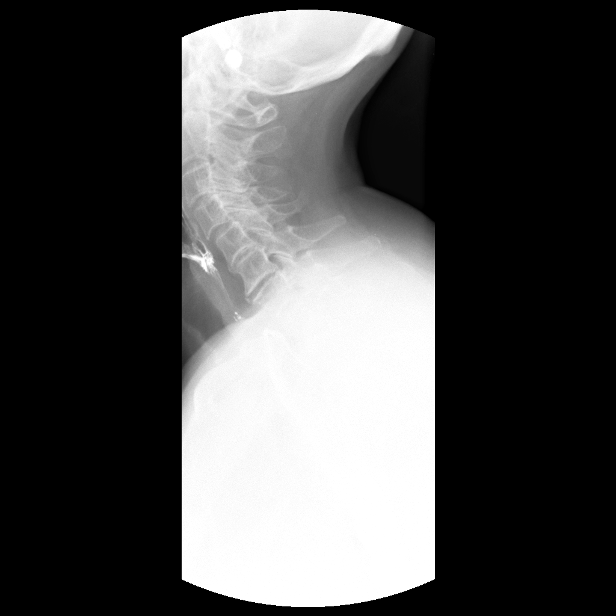
[im 25/25]
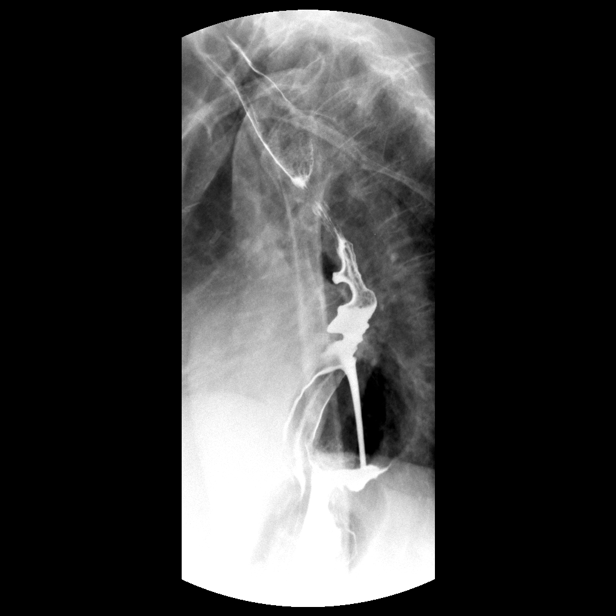

[Series 6: run · 1 of 1 slices shown (8 of 9)]
[im 1/1]
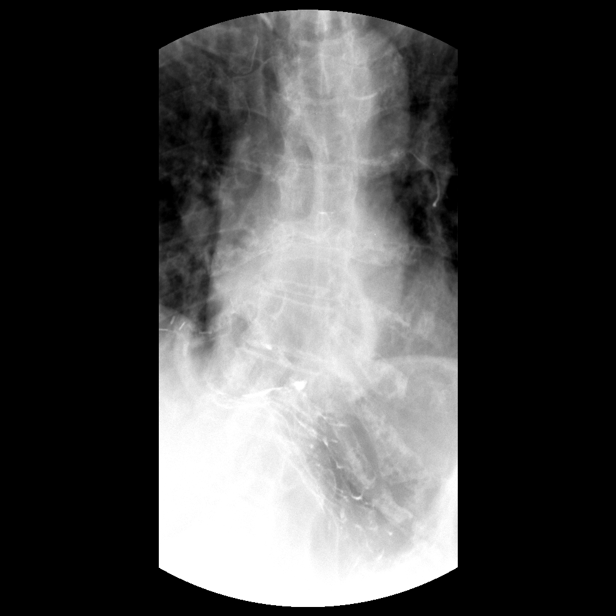

[Series 7: run · 1 of 1 slices shown (9 of 9)]
[im 1/1]
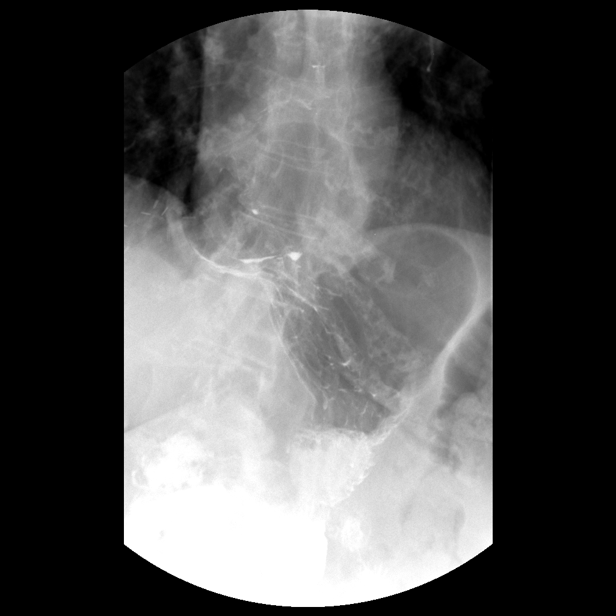

[14 of 24 positions shown; findings below may reference images not displayed]

FINDINGS: The patient had flash laryngeal penetration with thick barium with
no aspiration. The mucosa of the esophagus is normal. The patient
has an 8 cm hiatal hernia with a small traction diverticulum in the
esophagus just below the carina. There is no stricture. A 13 mm
barium tablet passed immediately from the mouth to the stomach with
no delay. Esophageal motility was normal.
IMPRESSION: 1. 8 cm hiatal hernia.
2. Traction diverticulum in the esophagus just below the carina.
3. Flash laryngeal penetration with thick barium.

## 2014-12-02 ENCOUNTER — Encounter: Payer: Medicare Other | Admitting: Internal Medicine

## 2014-12-04 ENCOUNTER — Ambulatory Visit (HOSPITAL_COMMUNITY): Payer: Medicare Other

## 2014-12-11 ENCOUNTER — Ambulatory Visit (HOSPITAL_COMMUNITY)
Admission: RE | Admit: 2014-12-11 | Discharge: 2014-12-11 | Disposition: A | Discharge status: Home / Self Care | Payer: Medicare Other | Source: Ambulatory Visit | Attending: Physician Assistant | Admitting: Physician Assistant

## 2014-12-11 DIAGNOSIS — K862 Cyst of pancreas: Secondary | ICD-10-CM | POA: Diagnosis not present

## 2014-12-11 DIAGNOSIS — N281 Cyst of kidney, acquired: Secondary | ICD-10-CM | POA: Insufficient documentation

## 2014-12-11 DIAGNOSIS — K869 Disease of pancreas, unspecified: Secondary | ICD-10-CM

## 2014-12-11 DIAGNOSIS — R935 Abnormal findings on diagnostic imaging of other abdominal regions, including retroperitoneum: Secondary | ICD-10-CM

## 2014-12-18 ENCOUNTER — Encounter: Payer: Self-pay | Admitting: Internal Medicine

## 2014-12-18 ENCOUNTER — Ambulatory Visit (INDEPENDENT_AMBULATORY_CARE_PROVIDER_SITE_OTHER): Payer: Medicare Other | Admitting: Internal Medicine

## 2014-12-18 VITALS — BP 116/80 | HR 72 | Ht 64.0 in | Wt 172.0 lb

## 2014-12-18 DIAGNOSIS — R1314 Dysphagia, pharyngoesophageal phase: Secondary | ICD-10-CM | POA: Diagnosis not present

## 2014-12-18 DIAGNOSIS — K869 Disease of pancreas, unspecified: Secondary | ICD-10-CM | POA: Diagnosis not present

## 2014-12-18 DIAGNOSIS — R935 Abnormal findings on diagnostic imaging of other abdominal regions, including retroperitoneum: Secondary | ICD-10-CM | POA: Diagnosis not present

## 2014-12-18 DIAGNOSIS — K219 Gastro-esophageal reflux disease without esophagitis: Secondary | ICD-10-CM

## 2014-12-18 NOTE — Patient Instructions (Addendum)
Continue Protonix 40mg  daily and Gaviscon at night.   Elevate head at night while sleeping.  Dr Lona Kettle

## 2014-12-18 NOTE — Progress Notes (Signed)
Cassandra Ray 1923/03/19 [id number]  Note: This dictation was prepared with Dragon digital system. Any transcriptional errors that result from this procedure are unintentional.   History of Present Illness: This is a 81 year old white female with large hiatal hernia confirmed on recent barium esophagram which showed 8 cm nonreducible hernia with traction diverticulum. Laryngeal penetration but no aspiration. There was no stricture. 13 mm tablet passed without delay. She had normal motility. Patient has been much improved since her appointment with Cecille Rubin on 11/24/2014 when she started on Protonix 40 mg in the morning and Gaviscon 2 tablets at bedtime. She also had a follow-up ultrasound of the upper abdomen for follow-up of pancreatic cyst. There is a 1 cm small cyst in the body of the pancreas rest of the pancreas not worrisome.. There was no specific concern for the appearance of the cyst. The liver was heterogeneous.    Past Medical History  Diagnosis Date  . HTN (hypertension)   . Hypothyroidism   . Insomnia   . Actinic keratoses   . Atrial fibrillation     "only one time"  . Arthritis     back   . Cancer     Breast cancer (right)  . Compression fracture of L3 lumbar vertebra   . GERD (gastroesophageal reflux disease)     takes otc Pepcid as needed    Past Surgical History  Procedure Laterality Date  . Breast lumpectomy Right 1995    lumpectomy  . Tonsillectomy    . Colonoscopy    . Eye surgery Bilateral     cataract w/ lens implant  . Kyphoplasty N/A 09/18/2013    Procedure: KYPHOPLASTY;  Surgeon: Sinclair Ship, MD;  Location: [facility];  Service: Orthopedics;  Laterality: N/A;  Lumbar 3 kyphoplasty    No Known Allergies  Family history and social history have been reviewed.  Review of Systems:   The remainder of the 10 point ROS is negative except as outlined in the H&P  Physical Exam: General Appearance Well developed, in no distress, mildly overweight Eyes   Non icteric  HEENT  Non traumatic, normocephalic , normal voice Mouth No lesion, tongue papillated, no cheilosis Neck Supple without adenopathy, thyroid not enlarged, no carotid bruits, no JVD Lungs Clear to auscultation bilaterally COR Normal S1, normal S2, regular rhythm, no murmur, quiet precordium Abdomen protuberant but soft Rectal not done Extremities  No pedal edema Skin No lesions Neurological Alert and oriented x 3 Psychological Normal mood and affect  Assessment and Plan:   81 year old white female with a large hiatal hernia and gastroesophageal reflux with laryngeal penetration but no aspiration. She has been improved on antireflux measures, Protonix and Gaviscon.She will continue the same regimen.   Small pancreatic cyst 1 cm no follow-up suggested  Colorectal screening. Last colonoscopy 2006 no further recall colonoscopies    [name] 12/18/2014

## 2015-01-21 ENCOUNTER — Ambulatory Visit: Payer: Medicare Other | Admitting: Cardiology

## 2015-02-03 ENCOUNTER — Ambulatory Visit (INDEPENDENT_AMBULATORY_CARE_PROVIDER_SITE_OTHER): Payer: Medicare Other | Admitting: Cardiology

## 2015-02-03 ENCOUNTER — Encounter: Payer: Self-pay | Admitting: Cardiology

## 2015-02-03 VITALS — BP 136/80 | HR 72 | Ht 64.0 in | Wt 171.0 lb

## 2015-02-03 DIAGNOSIS — I480 Paroxysmal atrial fibrillation: Secondary | ICD-10-CM | POA: Diagnosis not present

## 2015-02-03 DIAGNOSIS — I10 Essential (primary) hypertension: Secondary | ICD-10-CM

## 2015-02-03 DIAGNOSIS — I5032 Chronic diastolic (congestive) heart failure: Secondary | ICD-10-CM | POA: Diagnosis not present

## 2015-02-03 NOTE — Progress Notes (Signed)
[facility] [address] [place], [place]  [address] Phone: [phone] Fax:  [phone]  Date:  02/03/2015   ID:  SHELTON FURNISS, DOB 1923-03-23, MRN [id number]  PCP:   Melinda Crutch, MD   History of Present Illness: Cassandra Ray is a 81 y.o. female with dyspnea, grade 3 diastolic dysfunction, normal ejection fraction, hypertension here for followup. She was hospitalized on 04/25/14 with shortness of breath. She was quite hypertensive 202/106. She was given IV Lasix, Nitropaste, proBNP was elevated to 824, chest x-ray showed pulmonary edema. She also had hyponatremia. She was eventually discharged on Lasix 20 mg every other day. Echocardiogram 04/25/14 showed EF of [id number], grade 2 diastolic dysfunction. Overall she is feeling much better.  In November of 2014 was hospitalized in [place]. Blood pressure medication was increased. Hyponatremia has been chronic. She underwent a stress test which showed no evidence of ischemia. Low risk. Normal EF. Echocardiogram showed EF of 55-60% with mild LVH, mild pulmonary hypertension, trace TR. I reviewed her discharge summary, she was visiting [place] and felt weak, vomiting, nausea. This began after eating seafood. She was diagnosed with A. fib and RVR. IV Lopressor. As well as fluids. She converted to normal rhythm. Abdominal ultrasound showed an incidental finding of pancreatic cyst with recommendations to repeat in 6-12 months. Her Toprol regimen was continued however lisinopril 10 mg was substituted for Norvasc. I have reviewed emergency room stress and it is challenging for me to state clearly that she had atrial fibrillation. There do appear to be P waves present. Difficult to determine.  She underwent event monitor through December/January 2014/2015 which showed no evidence of atrial fibrillation. Excellent. She's had no further palpitations.   She is interested in coming off her aspirin. Concerned about bruising. We discussed  current guideline recommendations. See below.  Wt Readings from Last 3 Encounters:  02/03/15 171 lb (77.565 kg)  12/18/14 172 lb (78.019 kg)  11/24/14 169 lb (76.658 kg)     Past Medical History  Diagnosis Date  . HTN (hypertension)   . Hypothyroidism   . Insomnia   . Actinic keratoses   . Atrial fibrillation     "only one time"  . Arthritis     back   . Cancer     Breast cancer (right)  . Compression fracture of L3 lumbar vertebra   . GERD (gastroesophageal reflux disease)     takes otc Pepcid as needed    Past Surgical History  Procedure Laterality Date  . Breast lumpectomy Right 1995    lumpectomy  . Tonsillectomy    . Colonoscopy    . Eye surgery Bilateral     cataract w/ lens implant  . Kyphoplasty N/A 09/18/2013    Procedure: KYPHOPLASTY;  Surgeon: Sinclair Ship, MD;  Location: [facility];  Service: Orthopedics;  Laterality: N/A;  Lumbar 3 kyphoplasty    Current Outpatient Prescriptions  Medication Sig Dispense Refill  . aspirin EC 81 MG EC tablet Take 1 tablet (81 mg total) by mouth daily.    Marland Kitchen b complex vitamins tablet Take 1 tablet by mouth daily.    . Calcium Citrate-Vitamin D (CITRACAL + D PO) Take 1 tablet by mouth daily. 630 mg/ 400mg     . cholecalciferol (VITAMIN D) 400 UNITS TABS tablet Take 1,200 Units by mouth daily.     . DULoxetine (CYMBALTA) 30 MG capsule Take 30 mg by mouth daily.     . furosemide (  LASIX) 20 MG tablet Take 1 tablet (20 mg total) by mouth every other day. 30 tablet 0  . losartan (COZAAR) 50 MG tablet Take 50 mg by mouth daily.    . Magnesium 250 MG TABS Take 250 mg by mouth daily.    . Melatonin 5 MG TABS Take 1 tablet by mouth at bedtime as needed (sleep).    . metoprolol (LOPRESSOR) 100 MG tablet Take 100 mg by mouth 2 (two) times daily.    . mirabegron ER (MYRBETRIQ) 50 MG TB24 tablet Take 50 mg by mouth daily.    . Multiple Vitamin (MULTIVITAMIN) tablet Take 1 tablet by mouth daily.    . nitrofurantoin,  macrocrystal-monohydrate, (MACROBID) 100 MG capsule     . pantoprazole (PROTONIX) 40 MG tablet Take 40 mg by mouth daily.    . Probiotic Product (ALIGN PO) Take 1 tablet by mouth daily.    . SYMBICORT 80-4.5 MCG/ACT inhaler Take 2 puffs by mouth 2 (two) times daily.    Marland Kitchen SYNTHROID 137 MCG tablet Take 1 tablet by mouth daily.    Marland Kitchen lisinopril (PRINIVIL,ZESTRIL) 10 MG tablet Take 10 mg by mouth daily.      No current facility-administered medications for this visit.    Allergies:   No Known Allergies  Social History:  The patient  reports that she has never smoked. She has never used smokeless tobacco. She reports that she drinks about 4.2 oz of alcohol per week. She reports that she does not use illicit drugs.   ROS:  Please see the history of present illness.   No syncope, no fevers. Positive back pain. No chest pain.    PHYSICAL EXAM: VS:  BP 136/80 mmHg  Pulse 72  Ht 5\' 4"  (1.626 m)  Wt 171 lb (77.565 kg)  BMI 29.34 kg/m2  SpO2 95% Well nourished, well developed, in no acute distress HEENT: normal Neck: no JVD Cardiac:  normal S1, S2; RRR; no murmur Lungs:  clear to auscultation bilaterally, no wheezing, rhonchi or rales Abd: soft, nontender, no hepatomegaly Ext: trace edema Skin: warm and dry Neuro: no focal abnormalities noted  EKG:  Prior EKGs from hospitalization reviewed. P waves are present however challenging to make a definitive diagnosis of atrial fibrillation. Intervals appear quite normal. Could be PAT?    Echo: 4/14: 1. There is moderate asymmetric left ventricle hypertrophy. 2. Left ventricular ejection fraction estimated by 2D at 55-60 percent. 3. There were no regional wall motion abnormalities. 4. There is mild tricuspid regurgitation. 5. Mildly elevated estimated right ventricular systolic pressure. 6. Mild calcification of the aortic valve. 7. Analysis of mitral valve inflow, pulmonary vein Doppler and tissue Doppler suggests grade III diastolic  dysfunction with elevated left atrial pressures.   ASSESSMENT AND PLAN:  1. Diastolic heart failure-grade 2 diastolic dysfunction, normal ejection fraction. Responded to IV diuresis. Hyponatremia appears to be chronic. Low-dose Lasix every other day. She understands that if her weight gains by 3-5 pounds she may take an extra Lasix dose. Overall she is doing well currently. Not terribly interested in taking increased Lasix at this time. 2. Possible atrial fibrillation paroxysmal- Event monitor was reassuring. No evidence of atrial fibrillation over the month of December/January. She states that she did not realize that she was in atrial fibrillation when she went to the emergency room. They did not start her on anticoagulation in the hospital which may lead me to believe that there could of been question in the diagnosis of atrial fibrillation as  well or perhaps it was not started because of short episode. Stress test, echocardiogram reassuring. Excellent. In fact, she is interested in coming off of her aspirin. Since we would be utilizing the aspirin purely for primary prevention, given her advanced age, greater than 60, her risk of bleeding from aspirin likely outweighs the risk. There is no current recommendation from the [facility] of internal medicine she would like to come off of this. 3. Hypertension-well controlled. Agree with metoprolol as well as ACE inhibitor given her current condition. 4. We will see back in 6 months.  Signed, Candee Furbish, MD [facility]  02/03/2015 10:09 AM

## 2015-02-03 NOTE — Patient Instructions (Signed)
Medication Instructions:  Please stop your ASA. Continue all other medications as listed.  Follow-Up: Follow up in 6 months with Dr. [name].  You will receive a letter in the mail 2 months before you are due.  Please call us when you receive this letter to schedule your follow up appointment.  Thank you for choosing [facility]!!

## 2015-02-21 ENCOUNTER — Emergency Department (HOSPITAL_COMMUNITY)
Admission: EM | Admit: 2015-02-21 | Discharge: 2015-02-21 | Disposition: A | Discharge status: Home / Self Care | Payer: Medicare Other | Attending: Emergency Medicine | Admitting: Emergency Medicine

## 2015-02-21 ENCOUNTER — Emergency Department (HOSPITAL_COMMUNITY): Payer: Medicare Other

## 2015-02-21 ENCOUNTER — Encounter (HOSPITAL_COMMUNITY): Payer: Self-pay | Admitting: Emergency Medicine

## 2015-02-21 DIAGNOSIS — M1990 Unspecified osteoarthritis, unspecified site: Secondary | ICD-10-CM | POA: Diagnosis not present

## 2015-02-21 DIAGNOSIS — S0285XA Fracture of orbit, unspecified, initial encounter for closed fracture: Secondary | ICD-10-CM

## 2015-02-21 DIAGNOSIS — I4891 Unspecified atrial fibrillation: Secondary | ICD-10-CM | POA: Insufficient documentation

## 2015-02-21 DIAGNOSIS — Y9389 Activity, other specified: Secondary | ICD-10-CM | POA: Insufficient documentation

## 2015-02-21 DIAGNOSIS — W19XXXA Unspecified fall, initial encounter: Secondary | ICD-10-CM

## 2015-02-21 DIAGNOSIS — K219 Gastro-esophageal reflux disease without esophagitis: Secondary | ICD-10-CM | POA: Insufficient documentation

## 2015-02-21 DIAGNOSIS — W01198A Fall on same level from slipping, tripping and stumbling with subsequent striking against other object, initial encounter: Secondary | ICD-10-CM | POA: Diagnosis not present

## 2015-02-21 DIAGNOSIS — Z8669 Personal history of other diseases of the nervous system and sense organs: Secondary | ICD-10-CM | POA: Diagnosis not present

## 2015-02-21 DIAGNOSIS — Z79899 Other long term (current) drug therapy: Secondary | ICD-10-CM | POA: Diagnosis not present

## 2015-02-21 DIAGNOSIS — S023XXA Fracture of orbital floor, initial encounter for closed fracture: Secondary | ICD-10-CM | POA: Diagnosis not present

## 2015-02-21 DIAGNOSIS — Z7951 Long term (current) use of inhaled steroids: Secondary | ICD-10-CM | POA: Insufficient documentation

## 2015-02-21 DIAGNOSIS — Z872 Personal history of diseases of the skin and subcutaneous tissue: Secondary | ICD-10-CM | POA: Diagnosis not present

## 2015-02-21 DIAGNOSIS — I10 Essential (primary) hypertension: Secondary | ICD-10-CM | POA: Diagnosis not present

## 2015-02-21 DIAGNOSIS — S0993XA Unspecified injury of face, initial encounter: Secondary | ICD-10-CM | POA: Diagnosis present

## 2015-02-21 DIAGNOSIS — E039 Hypothyroidism, unspecified: Secondary | ICD-10-CM | POA: Diagnosis not present

## 2015-02-21 DIAGNOSIS — Y92002 Bathroom of unspecified non-institutional (private) residence single-family (private) house as the place of occurrence of the external cause: Secondary | ICD-10-CM | POA: Diagnosis not present

## 2015-02-21 DIAGNOSIS — Z853 Personal history of malignant neoplasm of breast: Secondary | ICD-10-CM | POA: Insufficient documentation

## 2015-02-21 DIAGNOSIS — Y998 Other external cause status: Secondary | ICD-10-CM | POA: Diagnosis not present

## 2015-02-21 IMAGING — CT CT HEAD W/O CM
3 of 5 series · 15 of 47 positions shown, 18 images · non-contrast
Comparison: Head CT [DATE]

CLINICAL DATA: Injury post fall. Walker came out from under her,
fall hitting face on the bathtub. Right facial injury. No loss of
consciousness.

EXAM:
CT HEAD WITHOUT CONTRAST
CT MAXILLOFACIAL WITHOUT CONTRAST
TECHNIQUE: Multidetector CT imaging of the head and maxillofacial structures
were performed using the standard protocol without intravenous
contrast. Multiplanar CT image reconstructions of the maxillofacial
structures were also generated.

[Series 3: facial st · axial · 0.29mm/px · z∈[-240,-112]mm · 9 of 76 slices shown, 12 images]
[im 6/76  brain]
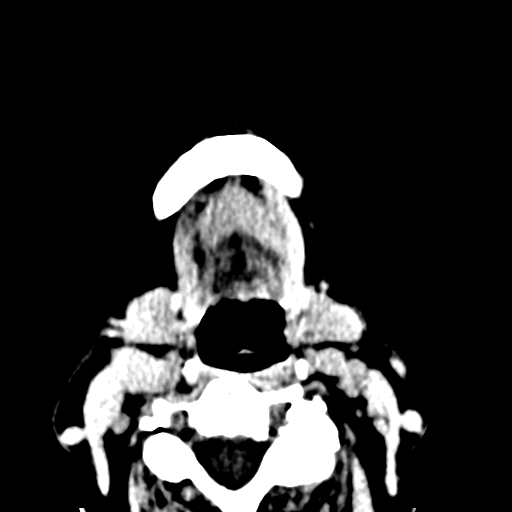
[im 6/76  bone]
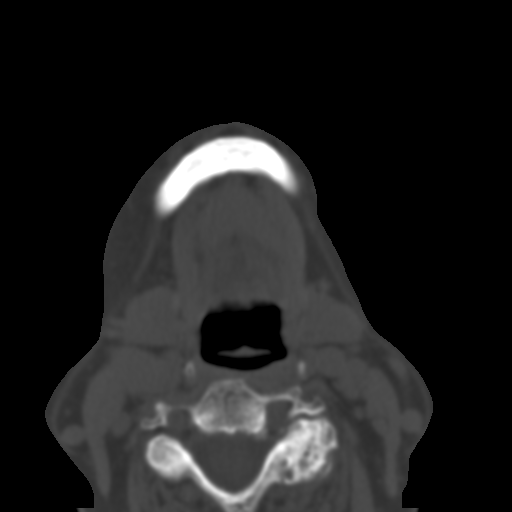
[im 18/76  brain]
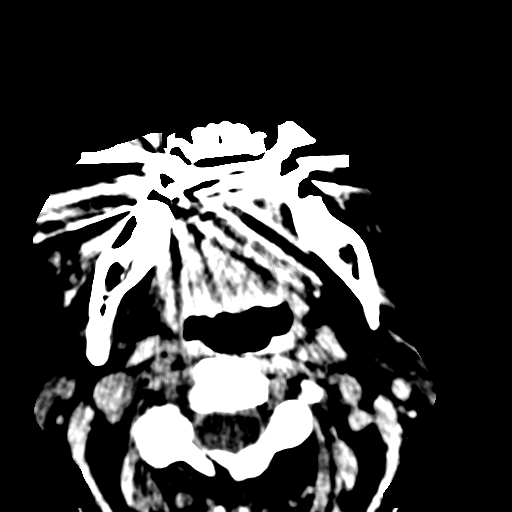
[im 24/76  brain]
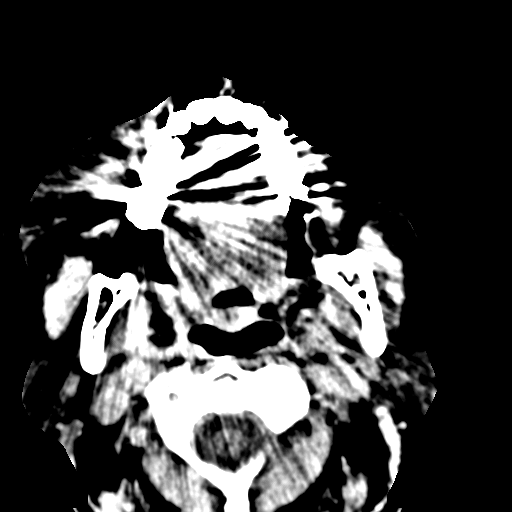
[im 29/76  brain]
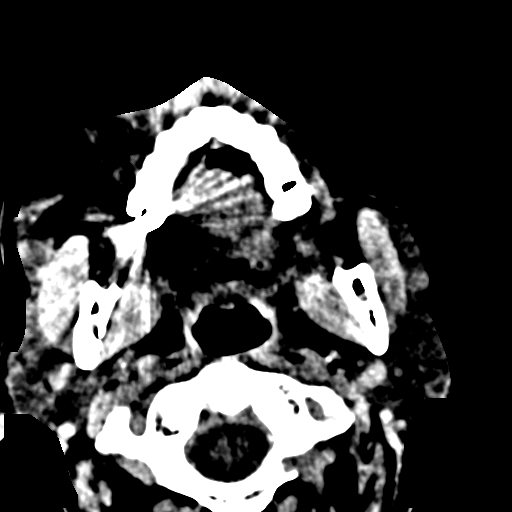
[im 41/76  brain]
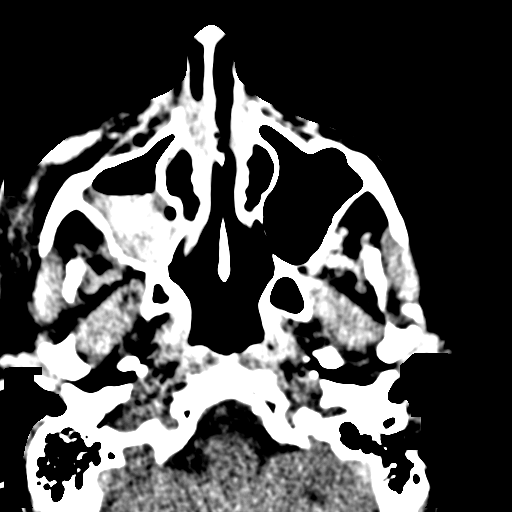
[im 41/76  bone]
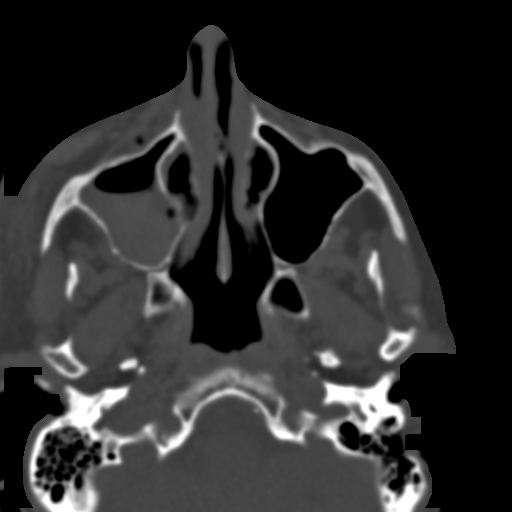
[im 47/76  brain]
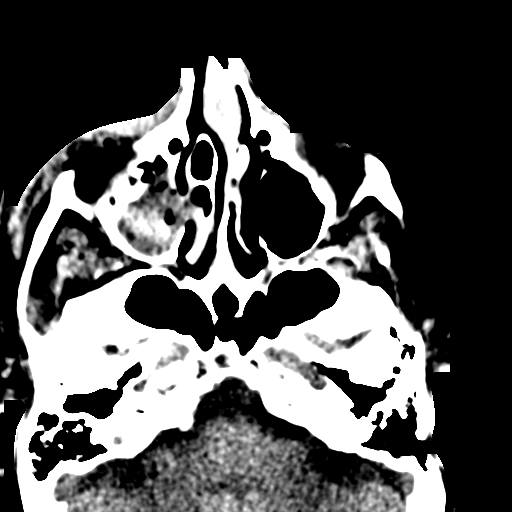
[im 52/76  brain]
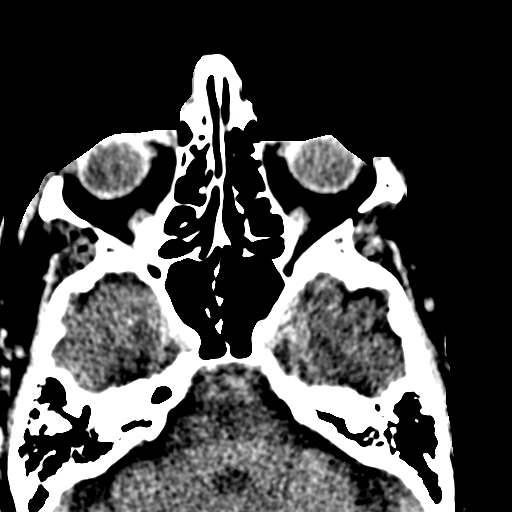
[im 64/76  brain]
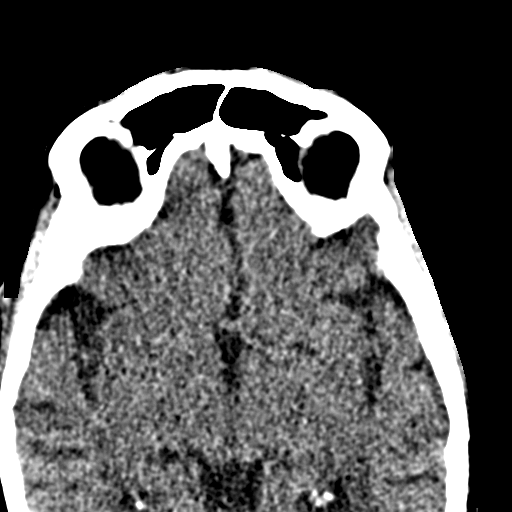
[im 70/76  brain]
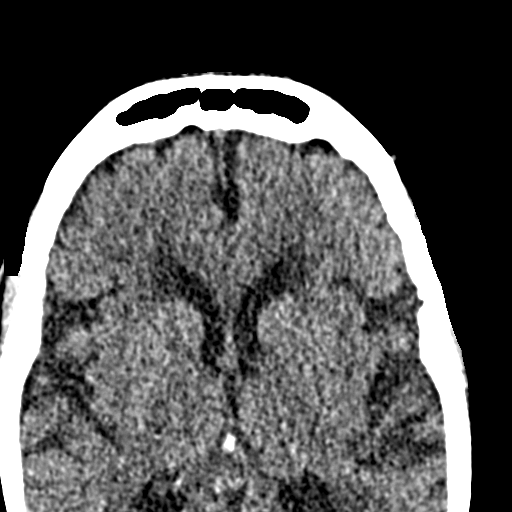
[im 70/76  bone]
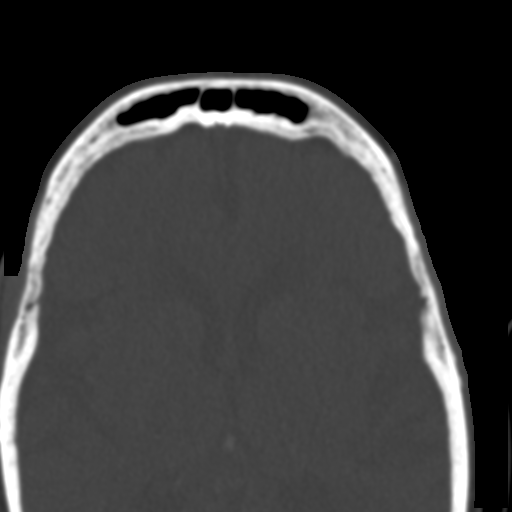

[Series 9: coronal st · coronal · 0.29mm/px · 3 of 74 slices shown]
[im 25/74  brain]
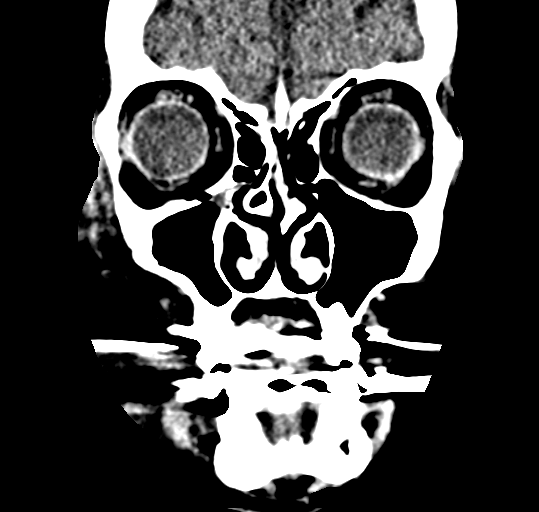
[im 33/74  brain]
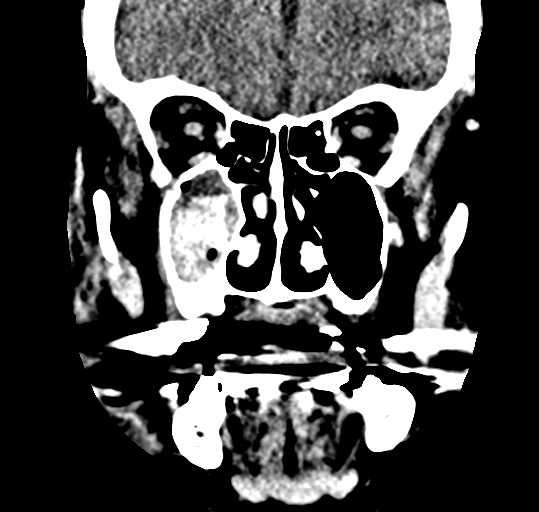
[im 41/74  brain]
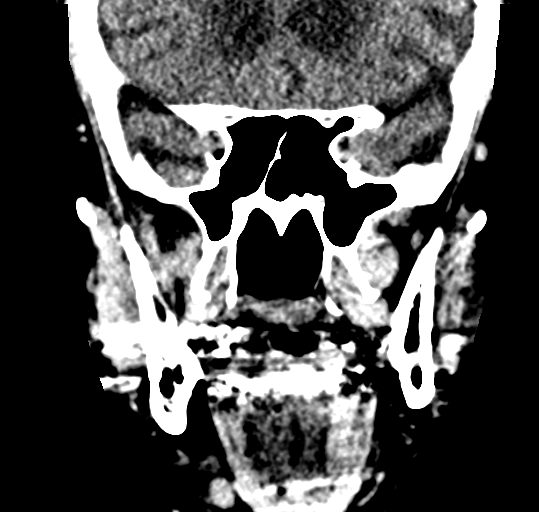

[Series 10: sagittal st · sagittal · 0.29mm/px · 3 of 76 slices shown]
[im 26/76  brain]
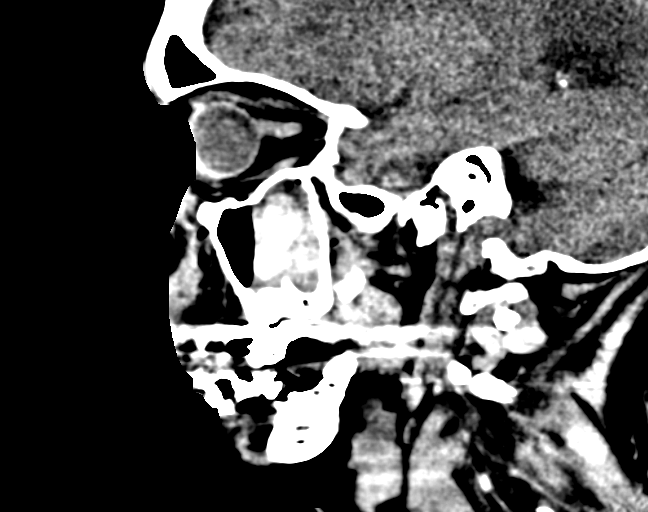
[im 38/76  brain]
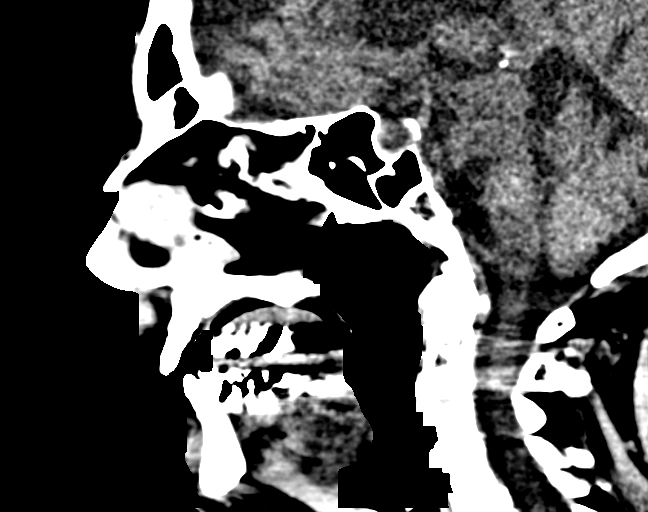
[im 51/76  brain]
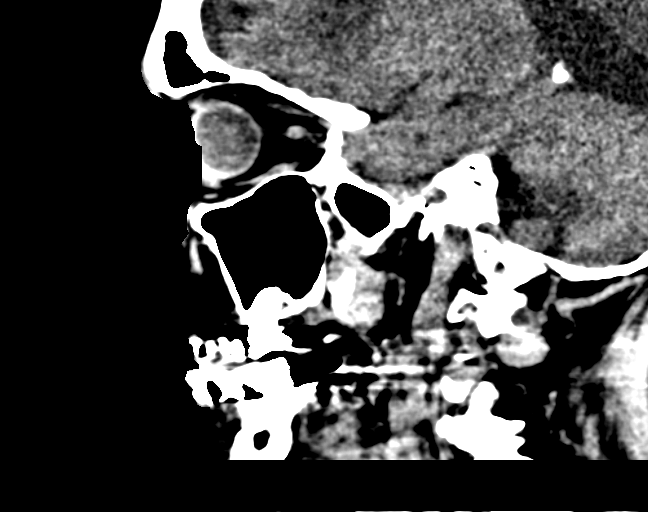

[15 of 47 positions shown; findings below may reference images not displayed]

FINDINGS: CT HEAD FINDINGS

No intracranial hemorrhage, mass effect, or midline shift. Atrophy
is unchanged. Unchanged chronic small vessel ischemic change. No
hydrocephalus. The basilar cisterns are patent. No evidence of acute
territorial infarct. No intracranial fluid collection. Calvarium is
intact. The mastoid air cells are well aerated.

CT MAXILLOFACIAL FINDINGS

Mildly displaced fracture of the right inferior orbital wall. No
extraocular muscle entrapment. Small foci of retrobulbar air
adjacent to the fracture site. Fracture extends to involve the
anterior lateral wall of the right associated hemorrhage in the
right maxillary sinus. The globe is intact. The left orbit and globe
appear normal. There is right periorbital edema. Nasal bone,
mandibles, zygomatic arches and pterygoid plates are intact.
Maxillary sinus.
IMPRESSION: 1. No acute intracranial abnormality. Stable atrophy and chronic
small vessel ischemic change.
2. Mildly displaced fracture right inferior orbital wall, extending
to involve the anterior lateral right maxillary sinus.

## 2015-02-21 MED ORDER — HYDROCODONE-ACETAMINOPHEN 5-325 MG PO TABS: 1.0000 | ORAL_TABLET | Freq: Four times a day (QID) | ORAL | Status: DC | PRN

## 2015-02-21 MED ORDER — HYDROCODONE-ACETAMINOPHEN 5-325 MG PO TABS
1.0000 | ORAL_TABLET | Freq: Once | ORAL | Status: AC
  Administered 2015-02-21: 1 via ORAL
  Filled 2015-02-21: qty 1

## 2015-02-21 MED ORDER — CEPHALEXIN 500 MG PO CAPS: 500.0000 mg | ORAL_CAPSULE | Freq: Three times a day (TID) | ORAL | Status: DC

## 2015-02-21 NOTE — ED Notes (Signed)
Bed: WA14 Expected date:  Expected time:  Means of arrival:  Comments: 

## 2015-02-21 NOTE — ED Notes (Addendum)
Per EMS pt suffered mechanical fall this evening when her walker came out from under her. She fell, hitting her face on the bathtub. Denies neck or back pain. No LOC. Bloody nose while en route to [facility], but bleeding was controlled prior to arrival.

## 2015-02-21 NOTE — ED Provider Notes (Signed)
CSN: [id number]     Arrival date & time 02/21/15  0423 History   First MD Initiated Contact with Patient 02/21/15 [phone]     Chief Complaint  Patient presents with  . Fall     (Consider location/radiation/quality/duration/timing/severity/associated sxs/prior Treatment) HPI Comments: Patient is an 81 year old female with past medical history of hypertension, atrial fibrillation, and prior compression fracture. She presents for evaluation of fall. She got up to the bathroom and tripped over her walker. She then struck her face on the side of the bathtub. Caused herself a bloody nose and has swelling to the right cheek. She denies any loss of consciousness. She denies any headache or neck pain. She did have a bloody nose which has been since controlled with direct pressure.  Patient is a 81 y.o. female presenting with fall. The history is provided by the patient.  Fall This is a new problem. The current episode started less than 1 hour ago. The problem occurs constantly. The problem has not changed since onset.Nothing aggravates the symptoms. Nothing relieves the symptoms. She has tried nothing for the symptoms. The treatment provided no relief.    Past Medical History  Diagnosis Date  . HTN (hypertension)   . Hypothyroidism   . Insomnia   . Actinic keratoses   . Atrial fibrillation     "only one time"  . Arthritis     back   . Cancer     Breast cancer (right)  . Compression fracture of L3 lumbar vertebra   . GERD (gastroesophageal reflux disease)     takes otc Pepcid as needed   Past Surgical History  Procedure Laterality Date  . Breast lumpectomy Right 1995    lumpectomy  . Tonsillectomy    . Colonoscopy    . Eye surgery Bilateral     cataract w/ lens implant  . Kyphoplasty N/A 09/18/2013    Procedure: KYPHOPLASTY;  Surgeon: Sinclair Ship, MD;  Location: [facility];  Service: Orthopedics;  Laterality: N/A;  Lumbar 3 kyphoplasty   Family History  Problem Relation Age of  Onset  . CAD Mother   . Diabetes Mellitus II Mother   . Heart failure Father   . Prostate cancer Brother   . Kidney cancer Brother    Social History  Substance Use Topics  . Smoking status: Never Smoker   . Smokeless tobacco: Never Used  . Alcohol Use: 4.2 oz/week    7 Glasses of wine per week     Comment: 1 glass of wine a day   OB History    No data available     Review of Systems  All other systems reviewed and are negative.     Allergies  Review of patient's allergies indicates no known allergies.  Home Medications   Prior to Admission medications   Medication Sig Start Date End Date Taking? Authorizing Provider  b complex vitamins tablet Take 1 tablet by mouth daily.    Historical Provider, MD  Calcium Citrate-Vitamin D (CITRACAL + D PO) Take 1 tablet by mouth daily. 630 mg/ 400mg     Historical Provider, MD  cholecalciferol (VITAMIN D) 400 UNITS TABS tablet Take 1,200 Units by mouth daily.     Historical Provider, MD  DULoxetine (CYMBALTA) 30 MG capsule Take 30 mg by mouth daily.  05/04/14   Historical Provider, MD  furosemide (LASIX) 20 MG tablet Take 1 tablet (20 mg total) by mouth every other day. 04/26/14   Maryann Mikhail, DO  lisinopril ([place])  10 MG tablet Take 10 mg by mouth daily.  04/22/13   Historical Provider, MD  losartan (COZAAR) 50 MG tablet Take 50 mg by mouth daily.    Historical Provider, MD  Magnesium 250 MG TABS Take 250 mg by mouth daily.    Historical Provider, MD  Melatonin 5 MG TABS Take 1 tablet by mouth at bedtime as needed (sleep).    Historical Provider, MD  metoprolol (LOPRESSOR) 100 MG tablet Take 100 mg by mouth 2 (two) times daily.    Historical Provider, MD  mirabegron ER (MYRBETRIQ) 50 MG TB24 tablet Take 50 mg by mouth daily.    Historical Provider, MD  Multiple Vitamin (MULTIVITAMIN) tablet Take 1 tablet by mouth daily.    Historical Provider, MD  nitrofurantoin, macrocrystal-monohydrate, (MACROBID) 100 MG capsule   02/01/15   Historical Provider, MD  pantoprazole (PROTONIX) 40 MG tablet Take 40 mg by mouth daily.    Historical Provider, MD  Probiotic Product (ALIGN PO) Take 1 tablet by mouth daily.    Historical Provider, MD  SYMBICORT 80-4.5 MCG/ACT inhaler Take 2 puffs by mouth 2 (two) times daily. 12/09/14   Historical Provider, MD  SYNTHROID 137 MCG tablet Take 1 tablet by mouth daily. 12/08/14   Historical Provider, MD   BP 164/100 mmHg  Pulse 72  Temp(Src) 97.4 F (36.3 C) (Oral)  Resp 18  Ht 5\' 4"  (1.626 m)  Wt 165 lb (74.844 kg)  BMI 28.31 kg/m2  SpO2 97% Physical Exam  Constitutional: She is oriented to person, place, and time. She appears well-developed and well-nourished. No distress.  HENT:  Head: Normocephalic.  There is swelling and neck muscles to the right cheek. There is swelling to the bridge of the nose. There is dried blood in the nares, however septum is midline and there is no evidence for a septal hematoma.  Eyes: EOM are normal. Pupils are equal, round, and reactive to light.  Neck: Normal range of motion. Neck supple.  There is no cervical spine bony tenderness and no step-off. She has painless range of motion in all directions.  Cardiovascular: Normal rate and regular rhythm.  Exam reveals no gallop and no friction rub.   No murmur heard. Pulmonary/Chest: Effort normal and breath sounds normal. No respiratory distress. She has no wheezes.  Abdominal: Soft. Bowel sounds are normal. She exhibits no distension. There is no tenderness.  Musculoskeletal: Normal range of motion.  Neurological: She is alert and oriented to person, place, and time. No cranial nerve deficit. She exhibits normal muscle tone. Coordination normal.  Skin: Skin is warm and dry. She is not diaphoretic.  Nursing note and vitals reviewed.   ED Course  Procedures (including critical care time) Labs Review Labs Reviewed - No data to display  Imaging Review No results found. I have personally reviewed  and evaluated these images and lab results as part of my medical decision-making.   EKG Interpretation None      MDM   Final diagnoses:  None    CT scans reveal a mildly displaced inferior orbital wall fracture which extends to involve the lateral maxillary sinus. This will be treated with antibiotics and follow-up with [facility] orthopedics as an outpatient. Her cervical spine was cleared clinically and I do not feel warrants imaging.    Veryl Speak, MD 02/21/15 [phone]

## 2015-02-21 NOTE — ED Notes (Signed)
MD at bedside. 

## 2015-02-21 NOTE — Discharge Instructions (Signed)
Keflex as prescribed.  Follow up with [facility] in the next 2-3 days. Their contact information has been provided in this discharge summary for you to call on Monday to arrange this appointment.   Head Injury You have received a head injury. It does not appear serious at this time. Headaches and vomiting are common following head injury. It should be easy to awaken from sleeping. Sometimes it is necessary for you to stay in the emergency department for a while for observation. Sometimes admission to the hospital may be needed. After injuries such as yours, most problems occur within the first 24 hours, but side effects may occur up to 7-10 days after the injury. It is important for you to carefully monitor your condition and contact your health care provider or seek immediate medical care if there is a change in your condition. WHAT ARE THE TYPES OF HEAD INJURIES? Head injuries can be as minor as a bump. Some head injuries can be more severe. More severe head injuries include:  A jarring injury to the brain (concussion).  A bruise of the brain (contusion). This mean there is bleeding in the brain that can cause swelling.  A cracked skull (skull fracture).  Bleeding in the brain that collects, clots, and forms a bump (hematoma). WHAT CAUSES A HEAD INJURY? A serious head injury is most likely to happen to someone who is in a car wreck and is not wearing a seat belt. Other causes of major head injuries include bicycle or motorcycle accidents, sports injuries, and falls. HOW ARE HEAD INJURIES DIAGNOSED? A complete history of the event leading to the injury and your current symptoms will be helpful in diagnosing head injuries. Many times, pictures of the brain, such as CT or MRI are needed to see the extent of the injury. Often, an overnight hospital stay is necessary for observation.  WHEN SHOULD I SEEK IMMEDIATE MEDICAL CARE?  You should get help right away if:  You have confusion or  drowsiness.  You feel sick to your stomach (nauseous) or have continued, forceful vomiting.  You have dizziness or unsteadiness that is getting worse.  You have severe, continued headaches not relieved by medicine. Only take over-the-counter or prescription medicines for pain, fever, or discomfort as directed by your health care provider.  You do not have normal function of the arms or legs or are unable to walk.  You notice changes in the black spots in the center of the colored part of your eye (pupil).  You have a clear or bloody fluid coming from your nose or ears.  You have a loss of vision. During the next 24 hours after the injury, you must stay with someone who can watch you for the warning signs. This person should contact local emergency services (911 in the [place] if you have seizures, you become unconscious, or you are unable to wake up. HOW CAN I PREVENT A HEAD INJURY IN THE FUTURE? The most important factor for preventing major head injuries is avoiding motor vehicle accidents. To minimize the potential for damage to your head, it is crucial to wear seat belts while riding in motor vehicles. Wearing helmets while bike riding and playing collision sports (like football) is also helpful. Also, avoiding dangerous activities around the house will further help reduce your risk of head injury.  WHEN CAN I RETURN TO NORMAL ACTIVITIES AND ATHLETICS? You should be reevaluated by your health care provider before returning to these activities. If you have any  of the following symptoms, you should not return to activities or contact sports until 1 week after the symptoms have stopped:  Persistent headache.  Dizziness or vertigo.  Poor attention and concentration.  Confusion.  Memory problems.  Nausea or vomiting.  Fatigue or tire easily.  Irritability.  Intolerant of bright lights or loud noises.  Anxiety or depression.  Disturbed sleep. MAKE SURE YOU:   Understand these  instructions.  Will watch your condition.  Will get help right away if you are not doing well or get worse. Document Released: 05/22/2005 Document Revised: 05/27/2013 Document Reviewed: 01/27/2013 [facility] Patient Information 2015 [place], [place]. This information is not intended to replace advice given to you by your health care provider. Make sure you discuss any questions you have with your health care provider.

## 2015-04-15 ENCOUNTER — Other Ambulatory Visit: Payer: Self-pay

## 2015-04-15 DIAGNOSIS — Z1231 Encounter for screening mammogram for malignant neoplasm of breast: Secondary | ICD-10-CM

## 2015-05-06 ENCOUNTER — Telehealth: Payer: Self-pay | Admitting: Cardiology

## 2015-05-06 NOTE — Telephone Encounter (Signed)
Will forward to Dr [name] for review/orders and clearance

## 2015-05-06 NOTE — Telephone Encounter (Signed)
Request for surgical clearance:  1. What type of surgery is being performed? Knee Replacement   2. When is this surgery scheduled? Waiting on clearance   3. Are there any medications that need to be held prior to surgery and how long? Not sure, please advise   4. Name of physician performing surgery? Dr. Melrose Nakayama  5. What is your office phone and fax number? Fax: [phone] Phone: [phone]

## 2015-05-07 NOTE — Telephone Encounter (Signed)
Printed and taken to MR to be faxed 

## 2015-05-07 NOTE — Telephone Encounter (Signed)
    Cassandra Ray is too high risk from a cardiac standpoint for knee replacement surgery given her advanced age, prior diastolic heart failure.  I would not recommend proceeding.  Candee Furbish, MD

## 2015-05-12 ENCOUNTER — Ambulatory Visit: Payer: Medicare Other

## 2015-05-28 ENCOUNTER — Ambulatory Visit
Admission: RE | Admit: 2015-05-28 | Discharge: 2015-05-28 | Disposition: A | Discharge status: Home / Self Care | Payer: Medicare Other | Source: Ambulatory Visit

## 2015-05-28 DIAGNOSIS — Z1231 Encounter for screening mammogram for malignant neoplasm of breast: Secondary | ICD-10-CM

## 2015-07-22 ENCOUNTER — Ambulatory Visit (INDEPENDENT_AMBULATORY_CARE_PROVIDER_SITE_OTHER): Payer: Medicare Other | Admitting: Cardiology

## 2015-07-22 ENCOUNTER — Encounter: Payer: Self-pay | Admitting: Cardiology

## 2015-07-22 VITALS — BP 118/82 | HR 66 | Ht 64.5 in | Wt 179.8 lb

## 2015-07-22 DIAGNOSIS — I480 Paroxysmal atrial fibrillation: Secondary | ICD-10-CM | POA: Diagnosis not present

## 2015-07-22 DIAGNOSIS — I4891 Unspecified atrial fibrillation: Secondary | ICD-10-CM

## 2015-07-22 DIAGNOSIS — M25569 Pain in unspecified knee: Secondary | ICD-10-CM | POA: Diagnosis not present

## 2015-07-22 DIAGNOSIS — I10 Essential (primary) hypertension: Secondary | ICD-10-CM

## 2015-07-22 NOTE — Patient Instructions (Signed)
Medication Instructions:  The current medical regimen is effective;  continue present plan and medications.  Follow-Up: Follow up as needed with Dr Skains.  If you need a refill on your cardiac medications before your next appointment, please call your pharmacy.  Thank you for choosing [facility]!!     

## 2015-07-22 NOTE — Progress Notes (Signed)
[place]. [address], [address],   [address] Phone: [phone] Fax:  [phone]  Date:  07/22/2015   ID:  Cassandra Ray, DOB 04-13-1923, MRN [id number]  PCP:   Melinda Crutch, MD   History of Present Illness: Cassandra Ray is a 82 y.o. female with dyspnea, grade 3 diastolic dysfunction, normal ejection fraction, hypertension here for followup. She was hospitalized on 04/25/14 with shortness of breath. She was quite hypertensive 202/106. She was given IV Lasix, Nitropaste, proBNP was elevated to 824, chest x-ray showed pulmonary edema. She also had hyponatremia. She was eventually discharged on Lasix 20 mg every other day. Echocardiogram 04/25/14 showed EF of [id number], grade 2 diastolic dysfunction. Overall she is feeling much better.  In November of 2014 was hospitalized in [place]. Blood pressure medication was increased. Hyponatremia has been chronic. She underwent a stress test which showed no evidence of ischemia. Low risk. Normal EF. Echocardiogram showed EF of 55-60% with mild LVH, mild pulmonary hypertension, trace TR. I reviewed her discharge summary, she was visiting [place] and felt weak, vomiting, nausea. This began after eating seafood. She was diagnosed with A. fib and RVR. IV Lopressor. As well as fluids. She converted to normal rhythm. Abdominal ultrasound showed an incidental finding of pancreatic cyst with recommendations to repeat in 6-12 months. Her Toprol regimen was continued however lisinopril 10 mg was substituted for Norvasc. I have reviewed emergency room stress and it is challenging for me to state clearly that she had atrial fibrillation. There do appear to be P waves present. Difficult to determine.  She underwent event monitor through December/January 2014/2015 which showed no evidence of atrial fibrillation. Excellent. She's had no further palpitations.   07/22/15-knee osteoarthritis. I felt she was too high risk to undergo knee  replacement surgery.no chest pain, no shortness of breath. Lives at [place]. Sometimes has coughing when eating breakfast. Deals with mucus production. She is worried about pneumonia.  Wt Readings from Last 3 Encounters:  07/22/15 179 lb 12.8 oz (81.557 kg)  02/21/15 165 lb (74.844 kg)  02/03/15 171 lb (77.565 kg)     Past Medical History  Diagnosis Date  . HTN (hypertension)   . Hypothyroidism   . Insomnia   . Actinic keratoses   . Atrial fibrillation ([place])     "only one time"  . Arthritis     back   . Cancer [facility])     Breast cancer (right)  . Compression fracture of L3 lumbar vertebra ([facility])   . GERD (gastroesophageal reflux disease)     takes otc Pepcid as needed    Past Surgical History  Procedure Laterality Date  . Breast lumpectomy Right 1995    lumpectomy  . Tonsillectomy    . Colonoscopy    . Eye surgery Bilateral     cataract w/ lens implant  . Kyphoplasty N/A 09/18/2013    Procedure: KYPHOPLASTY;  Surgeon: Sinclair Ship, MD;  Location: [facility];  Service: Orthopedics;  Laterality: N/A;  Lumbar 3 kyphoplasty    Current Outpatient Prescriptions  Medication Sig Dispense Refill  . aspirin EC 81 MG tablet Take 81 mg by mouth daily.    Marland Kitchen b complex vitamins tablet Take 1 tablet by mouth daily.    . Calcium Citrate-Vitamin D (CITRACAL + D PO) Take 1 tablet by mouth daily. 630 mg/ 400mg     . cholecalciferol (VITAMIN D) 400 UNITS TABS tablet Take 1,200 Units by mouth daily.     Marland Kitchen  DULoxetine (CYMBALTA) 30 MG capsule Take 30 mg by mouth daily.     . furosemide (LASIX) 20 MG tablet Take 1 tablet (20 mg total) by mouth every other day. 30 tablet 0  . HYDROcodone-acetaminophen (NORCO) 5-325 MG per tablet Take 1 tablet by mouth every 6 (six) hours as needed. 20 tablet 0  . losartan (COZAAR) 50 MG tablet Take 50 mg by mouth daily.    . Magnesium 250 MG TABS Take 250 mg by mouth daily.    . Melatonin 5 MG TABS Take 1 tablet by mouth at bedtime as needed (sleep).    .  metoprolol (LOPRESSOR) 100 MG tablet Take 100 mg by mouth 2 (two) times daily.    . mirabegron ER (MYRBETRIQ) 50 MG TB24 tablet Take 50 mg by mouth daily.    . Multiple Vitamin (MULTIVITAMIN) tablet Take 1 tablet by mouth daily.    . pantoprazole (PROTONIX) 40 MG tablet Take 40 mg by mouth daily.    . Probiotic Product (ALIGN PO) Take 1 tablet by mouth daily.    . SYMBICORT 80-4.5 MCG/ACT inhaler Take 2 puffs by mouth 2 (two) times daily.    Marland Kitchen SYNTHROID 137 MCG tablet Take 1 tablet by mouth daily.     No current facility-administered medications for this visit.    Allergies:   No Known Allergies  Social History:  The patient  reports that she has never smoked. She has never used smokeless tobacco. She reports that she drinks about 4.2 oz of alcohol per week. She reports that she does not use illicit drugs.   ROS:  Please see the history of present illness.   No syncope, no fevers. Positive back pain. No chest pain.    PHYSICAL EXAM: VS:  BP 118/82 mmHg  Pulse 66  Ht 5' 4.5" (1.638 m)  Wt 179 lb 12.8 oz (81.557 kg)  BMI 30.40 kg/m2 Well nourished, well developed, in no acute distress HEENT: normal Neck: no JVD Cardiac:  normal S1, S2; RRR; no murmur Lungs:  clear to auscultation bilaterally, no wheezing, rhonchi or rales Abd: soft, nontender, no hepatomegaly Ext: trace edema Skin: warm and dry Neuro: no focal abnormalities noted  EKG:  EKG was ordered today. 07/22/15 sinus rhythm with first-degree AV block heart rate 66 bpm, PR interval 226 ms.-Prior EKGs from hospitalization reviewed. P waves are present however challenging to make a definitive diagnosis of atrial fibrillation. Intervals appear quite normal. Could be PAT?    Echo: 4/14: 1. There is moderate asymmetric left ventricle hypertrophy. 2. Left ventricular ejection fraction estimated by 2D at 55-60 percent. 3. There were no regional wall motion abnormalities. 4. There is mild tricuspid regurgitation. 5. Mildly elevated  estimated right ventricular systolic pressure. 6. Mild calcification of the aortic valve. 7. Analysis of mitral valve inflow, pulmonary vein Doppler and tissue Doppler suggests grade III diastolic dysfunction with elevated left atrial pressures.   ASSESSMENT AND PLAN:  1. Diastolic heart failure-grade 2 diastolic dysfunction, normal ejection fraction. Responded to IV diuresis. Hyponatremia appears to be chronic. Low-dose Lasix every other day. She understands that if her weight gains by 3-5 pounds she may take an extra Lasix dose. Overall she is doing well currently. Not terribly interested in taking increased Lasix at this time. 2. Possible atrial fibrillation paroxysmal- Event monitor was reassuring. No evidence of atrial fibrillation over the month of December/January. She states that she did not realize that she was in atrial fibrillation when she went to the emergency room.  They did not start her on anticoagulation in the hospital which may lead me to believe that there could of been question in the diagnosis of atrial fibrillation as well or perhaps it was not started because of short episode. Stress test, echocardiogram reassuring. Excellent. In fact, she is interested in coming off of her aspirin. Since we would be utilizing the aspirin purely for primary prevention, given her advanced age, greater than 71, her risk of bleeding from aspirin likely outweighs the risk. There is no current recommendation from guidelines internal medicine she would like to come off of this. 3. Hypertension-well controlled. Agree with metoprolol as well as ACE inhibitor given her current condition. 4. Knee pain-she would be high risk for knee surgery given her advanced age primarily and prior history of diastolic heart failure requiring IV diuresis. 5. We will see back as-needed basis.  Signed, Candee Furbish, MD [facility]  07/22/2015 3:49 PM

## 2015-08-09 ENCOUNTER — Ambulatory Visit: Payer: Medicare Other | Admitting: Cardiology

## 2015-12-15 ENCOUNTER — Telehealth: Payer: Self-pay | Admitting: Gastroenterology

## 2015-12-15 NOTE — Telephone Encounter (Signed)
Pt saw Dr Harrington Challenger and was advised to f/u with GI for 3 weeks of diarrhea.  Appt with [name] for 12/21/15.  Dr Loletha Carrow appt is 01/11/16

## 2015-12-16 ENCOUNTER — Encounter: Payer: Self-pay | Admitting: *Deleted

## 2015-12-21 ENCOUNTER — Ambulatory Visit (INDEPENDENT_AMBULATORY_CARE_PROVIDER_SITE_OTHER): Payer: Medicare Other | Admitting: Physician Assistant

## 2015-12-21 ENCOUNTER — Encounter: Payer: Self-pay | Admitting: Physician Assistant

## 2015-12-21 ENCOUNTER — Other Ambulatory Visit: Payer: Medicare Other

## 2015-12-21 VITALS — BP 136/88 | HR 80 | Ht 64.5 in | Wt 156.0 lb

## 2015-12-21 DIAGNOSIS — A09 Infectious gastroenteritis and colitis, unspecified: Secondary | ICD-10-CM

## 2015-12-21 DIAGNOSIS — R197 Diarrhea, unspecified: Secondary | ICD-10-CM

## 2015-12-21 MED ORDER — METRONIDAZOLE 250 MG PO TABS: ORAL_TABLET | ORAL | Status: DC

## 2015-12-21 MED ORDER — SACCHAROMYCES BOULARDII 250 MG PO CAPS: 250.0000 mg | ORAL_CAPSULE | Freq: Two times a day (BID) | ORAL | Status: DC

## 2015-12-21 NOTE — Progress Notes (Signed)
Thank you for sending this case to me. I have reviewed the entire note, and the outlined plan seems appropriate.  

## 2015-12-21 NOTE — Progress Notes (Signed)
Patient ID: [name], female   DOB: [birth year], 82 y.o.   MRN: [id number]   Subjective:    Patient ID: Cassandra Ray, female    DOB: 08-17-1922, 82 y.o.   MRN: [id number]  HPI Cassandra Ray is a pleasant 82 year old white female former patient of Dr. Delfin Ray who is referred today by Dr. Franco Ray office with complaint of diarrhea. She says she has had persistent diarrhea over the past 3 weeks She did have a stool culture done which we have take obtained a copy of and this was negative. Patient had not been placed on any new medications nor can she remember any recent antibiotics. She's had no recent travel. She and her husband live at [facility]. She says most days she's having 3-4 episodes of loose to watery stools sometimes explosive. Stools have not particularly been malodorous and she has not seen any blood. No nocturnal episodes. She did have one episode of incontinence yesterday and this was the first since onset of her illness. There has been no associated fever or chills no nausea or vomiting no complaints of abdominal pain or cramping. Energy level has been normal and she does not feel particularly sick. Last colonoscopy was done in 2006 for follow-up of adenomatous colon polyps she was noted to have diverticulosis but no recurrent polyps. She is also known to have a very large hiatal hernia, atrial fibrillation, congestive heart failure, hypothyroidism and had a small pancreatic cyst evaluated in the past. Her husband also asked questions about her hiatal hernia because she occasionally does have episodes of what sounds like reflux regurgitation and occasional choking or coughing with meals.  Review of Systems Pertinent positive and negative review of systems were noted in the above HPI section.  All other review of systems was otherwise negative.  Outpatient Encounter Prescriptions as of 12/21/2015  Medication Sig  . Calcium Citrate-Vitamin D (CITRACAL + D PO) Take 1 tablet by  mouth daily. 630 mg/ 400mg   . cholecalciferol (VITAMIN D) 400 UNITS TABS tablet Take 1,200 Units by mouth daily.   . DULoxetine (CYMBALTA) 30 MG capsule Take 30 mg by mouth daily.   . furosemide (LASIX) 20 MG tablet Take 1 tablet (20 mg total) by mouth every other day.  Marland Kitchen Hyoscyamine Sulfate 0.375 MG TBCR Take by mouth 2 (two) times daily.  Marland Kitchen losartan (COZAAR) 50 MG tablet Take 50 mg by mouth daily.  . Magnesium 250 MG TABS Take 250 mg by mouth daily.  . Melatonin 5 MG TABS Take 1 tablet by mouth at bedtime as needed (sleep).  . metoprolol (LOPRESSOR) 100 MG tablet Take 100 mg by mouth 2 (two) times daily.  . mirabegron ER (MYRBETRIQ) 50 MG TB24 tablet Take 50 mg by mouth daily.  . Multiple Vitamin (MULTIVITAMIN) tablet Take 1 tablet by mouth daily.  . pantoprazole (PROTONIX) 40 MG tablet Take 40 mg by mouth daily.  . Probiotic Product (ALIGN PO) Take 1 tablet by mouth daily.  . SYMBICORT 80-4.5 MCG/ACT inhaler Take 2 puffs by mouth 2 (two) times daily.  Marland Kitchen SYNTHROID 137 MCG tablet Take 1 tablet by mouth daily.  Marland Kitchen b complex vitamins tablet Take 1 tablet by mouth daily.  . metroNIDAZOLE (FLAGYL) 250 MG tablet Take 1 tab 4 times a day with food for 14 days.  Marland Kitchen saccharomyces boulardii (FLORASTOR) 250 MG capsule Take 1 capsule (250 mg total) by mouth 2 (two) times daily.  . [DISCONTINUED] aspirin EC 81 MG tablet Take 81 mg  by mouth daily. Reported on 12/21/2015  . [DISCONTINUED] HYDROcodone-acetaminophen (NORCO) 5-325 MG per tablet Take 1 tablet by mouth every 6 (six) hours as needed. (Patient not taking: Reported on 12/21/2015)   No facility-administered encounter medications on file as of 12/21/2015.   No Known Allergies Patient Active Problem List   Diagnosis Date Noted  . Chronic diastolic heart failure ([place]) 02/03/2015  . Hyponatremia 04/25/2014  . SOB (shortness of breath)   . Hypertensive urgency 04/24/2014  . Compression fracture 09/18/2013  . Knee pain 08/11/2013  . A-fib ([place])  05/09/2013  . Back pain 05/09/2013  . HTN (hypertension)   . Hypothyroidism    Social History   Social History  . Marital Status: Married    Spouse Name: N/A  . Number of Children: N/A  . Years of Education: N/A   Occupational History  . Not on file.   Social History Main Topics  . Smoking status: Never Smoker   . Smokeless tobacco: Never Used  . Alcohol Use: 4.2 oz/week    7 Glasses of wine per week     Comment: 1 glass of wine a day  . Drug Use: No  . Sexual Activity: Not on file   Other Topics Concern  . Not on file   Social History Narrative    Cassandra Ray's family history includes CAD in her mother; Diabetes Mellitus II in her mother; Heart failure in her father; Kidney cancer in her brother; Prostate cancer in her brother.      Objective:    Filed Vitals:   12/21/15 1341  BP: 136/88  Pulse: 80    Physical Exam Cassandra-developed elderly white female in no acute distress, very pleasant accompanied by her husband, blood pressure 136/88 pulse 80, height 5 foot 4 weight 156. HEENT ;nontraumatic normocephalic EOMI PERRLA sclera anicteric, Cardiovascular; regular rate and rhythm with [id number] systolic murmur, Pulmonary ;clear bilaterally, Abdomen ;soft nontender nondistended bowel sounds are active there is no palpable mass or hepatosplenomegaly rectal;exam not done,Ext; ambulates with a walker , no clubbing cyanosis or edema skin warm and dry, Neuropsych; mood and affect appropriate    Assessment & Plan:  #76 82 year old female with 3 week history of persistent loose to watery diarrhea and no other associated symptoms. Suspect this is of infectious etiology will need to rule out C. Difficile #2 large hiatal hernia previously documented at 8 cm-no stricture at the time of esophagram 1 year ago #3 atrial fibrillation #4 hypertension #5 congestive heart failure #6 history of adenomatous colon polyps #7 diverticulosis  Plan; we'll check stool for C. difficile PCR Start  empiric course of metronidazole 250 mg by mouth 4 times a day to be taken with food 14 days Start Florastor one by mouth twice a day 30 days She may try Imodium 2 by mouth every morning and 2 at lunch time over the next week until symptoms improve Patient is asked to call if her symptoms are persisting after she completes the current course of antibiotics and we will decide on indication for any further workup.  Pt will be established with Dr Loletha Carrow.    [name] Genia Harold PA-C 12/21/2015   Cc: Lona Kettle, MD

## 2015-12-21 NOTE — Patient Instructions (Signed)
Please go to the basement level to our lab for the stool test.   Take 2 imodium every morning when you get up, then 2 after lunch time as needed.  Call us after you complete the antibiotic ( Flagyl ) Metronidazole 250 mg with an update. We would like to know how you are doing. You can ask for Cheridan Kibler.

## 2015-12-23 ENCOUNTER — Other Ambulatory Visit: Payer: Medicare Other

## 2015-12-23 DIAGNOSIS — R197 Diarrhea, unspecified: Secondary | ICD-10-CM

## 2015-12-24 LAB — CLOSTRIDIUM DIFFICILE BY PCR

## 2015-12-27 ENCOUNTER — Telehealth: Payer: Self-pay | Admitting: Physician Assistant

## 2015-12-27 NOTE — Telephone Encounter (Signed)
Discussed in detail foods to avoid, foods that may not cause problems and foods that are okay. Eat small frequent meals and do not lay down for at least 2 hours after eating. Diarrhea is slightly improved. She is taking Imodium, Florastor and Flagyl as directed. Please review her labs and advise. Thank you

## 2015-12-27 NOTE — Telephone Encounter (Signed)
Patient husband calling back regarding this.

## 2015-12-31 ENCOUNTER — Telehealth: Payer: Self-pay | Admitting: Gastroenterology

## 2015-12-31 NOTE — Telephone Encounter (Signed)
Patient's husband reports that his wife is having episodes of choking with her meals and they are not able to eat in the dining room, because it has become embarrassing to her.  She will come in and see Dr. Loletha Carrow on Monday at [place]

## 2016-01-03 ENCOUNTER — Encounter: Payer: Self-pay | Admitting: Gastroenterology

## 2016-01-03 ENCOUNTER — Ambulatory Visit (INDEPENDENT_AMBULATORY_CARE_PROVIDER_SITE_OTHER): Payer: Medicare Other | Admitting: Gastroenterology

## 2016-01-03 ENCOUNTER — Telehealth: Payer: Self-pay | Admitting: Gastroenterology

## 2016-01-03 VITALS — BP 132/80 | HR 80 | Ht 64.5 in | Wt 156.0 lb

## 2016-01-03 DIAGNOSIS — R197 Diarrhea, unspecified: Secondary | ICD-10-CM

## 2016-01-03 DIAGNOSIS — R1314 Dysphagia, pharyngoesophageal phase: Secondary | ICD-10-CM

## 2016-01-03 DIAGNOSIS — A09 Infectious gastroenteritis and colitis, unspecified: Secondary | ICD-10-CM

## 2016-01-03 NOTE — Progress Notes (Signed)
[facility] Progress Note  Chief Complaint: Diarrhea and dysphagia  Subjective  History:  This patient saw our PA on 12/21/2015, and I reviewed the note. It sounded like she probably had infectious diarrhea, though outpatient stool studies were negative. It is not clear from reviewing those notes if ova and parasites were checked. [name] gave her a trial of Flagyl, and it sounds like the diarrhea is nearly resolved. She has 2 days of treatment left and will finish it. She is more concerned about worsening dysphagia. She and her husband note that for years she has had feelings of food feel stuck in the neck, and sometimes that might cause a lot of phlegm. She then drinks something and it will eventually pass. This was worked up just over a year ago by Dr. Olevia Perches, and a barium study showed an 8 cm hiatal hernia. She denies symptoms of regurgitation or heartburn. She finds the symptoms distressing because at times she wants to avoid going to the dining hall at their retirement community. Neither X[place] nor her husband believe there has been any weight loss. ROS: Cardiovascular:  no chest pain Respiratory: no dyspnea  The patient's Past Medical, Family and Social History were reviewed and are on file in the EMR.  Objective:  Med list reviewed  Vital signs in last 24 hrs: Vitals:   01/03/16 1047  BP: 132/80  Pulse: 80    Physical Exam   HEENT: sclera anicteric, oral mucosa moist without lesions  Neck: supple, no thyromegaly, JVD or lymphadenopathy  Cardiac: RRR without murmurs, S1S2 heard, no peripheral edema  Pulm: clear to auscultation bilaterally, normal RR and effort noted  Abdomen: soft, No tenderness, with active bowel sounds. No guarding or palpable hepatosplenomegaly.  Skin; warm and dry, no jaundice or rash  Radiologic studies: I personally reviewed the barium study from June 2016. I also went over the images with the patient and her husband since they were concerned about  this hernia and insistent upon reviewing the images. I additionally noted that the distal half of the esophagus is quite tortuous. Flash laryngeal penetration without aspiration was noted.  @ASSESSMENTPLANBEGIN @ Assessment: Encounter Diagnoses  Name Primary?  Marland Kitchen Dysphagia, pharyngoesophageal phase Yes  . Diarrhea of presumed infectious origin     I think most of this problem is oropharyngeal dysphagia rather than from the hiatal hernia. I am sure there is an additional component of dysmotility and the mechanical component of the hernia.  Plan: Modified barium study. She seems likely to require a modified diet and swallowing techniques. This does not appear to be amenable to endoscopic therapy. In addition, I do not think that surgery to reduce this hernia is likely to give significant benefit, and the risks outweigh the benefits at her age.   Total time 25 minutes, over half spent in counseling and coordination of care.  Topics discussed: The nature of dysphagia and its workup. I answered extensive questions from them to their satisfaction.  [name]

## 2016-01-03 NOTE — Telephone Encounter (Signed)
Verified the date and time for the modified barium swallow. Pt states understanding.

## 2016-01-03 NOTE — Patient Instructions (Signed)
If you are age 80 or older, your body mass index should be between 23-30. Your Body mass index is 26.36 kg/m. If this is out of the aforementioned range listed, please consider follow up with your Primary Care Provider.  If you are age 16 or younger, your body mass index should be between 19-25. Your Body mass index is 26.36 kg/m. If this is out of the aformentioned range listed, please consider follow up with your Primary Care Provider.   You have been scheduled for a modified barium swallow on 01-14-2016 at 11am. Please arrive 15 minutes prior to your test for registration. You will go to [facility] Registration (entrance A)  for your appointment. Should you need to cancel or reschedule your appointment, please contact [phone] [facility] [place]) or [phone] [place]). _____________________________________________________________________ A Modified Barium Swallow Study, or MBS, is a special x-ray that is taken to check swallowing skills. It is carried out by a Stage manager and a Psychologist, clinical (SLP). During this test, yourmouth, throat, and esophagus, a muscular tube which connects your mouth to your stomach, is checked. The test will help you, your doctor, and the SLP plan what types of foods and liquids are easier for you to swallow. The SLP will also identify positions and ways to help you swallow more easily and safely. What will happen during an MBS? You will be taken to an x-ray room and seated comfortably. You will be asked to swallow small amounts of food and liquid mixed with barium. Barium is a liquid or paste that allows images of your mouth, throat and esophagus to be seen on x-ray. The x-ray captures moving images of the food you are swallowing as it travels from your mouth through your throat and into your esophagus. This test helps identify whether food or liquid is entering your lungs (aspiration). The test also shows which part of your mouth or throat lacks strength  or coordination to move the food or liquid in the right direction. This test typically takes 30 minutes to 1 hour to complete. _______________________________________________________________________

## 2016-01-04 ENCOUNTER — Other Ambulatory Visit (HOSPITAL_COMMUNITY): Payer: Self-pay | Admitting: Gastroenterology

## 2016-01-04 DIAGNOSIS — R1310 Dysphagia, unspecified: Secondary | ICD-10-CM

## 2016-01-11 ENCOUNTER — Ambulatory Visit (HOSPITAL_COMMUNITY)
Admission: RE | Admit: 2016-01-11 | Discharge: 2016-01-11 | Disposition: A | Discharge status: Home / Self Care | Payer: Medicare Other | Source: Ambulatory Visit | Attending: Gastroenterology | Admitting: Gastroenterology

## 2016-01-11 ENCOUNTER — Ambulatory Visit: Payer: Medicare Other | Admitting: Gastroenterology

## 2016-01-11 DIAGNOSIS — E039 Hypothyroidism, unspecified: Secondary | ICD-10-CM | POA: Diagnosis not present

## 2016-01-11 DIAGNOSIS — I10 Essential (primary) hypertension: Secondary | ICD-10-CM | POA: Diagnosis not present

## 2016-01-11 DIAGNOSIS — R1313 Dysphagia, pharyngeal phase: Secondary | ICD-10-CM | POA: Diagnosis not present

## 2016-01-11 DIAGNOSIS — M8580 Other specified disorders of bone density and structure, unspecified site: Secondary | ICD-10-CM | POA: Diagnosis not present

## 2016-01-11 DIAGNOSIS — R1310 Dysphagia, unspecified: Secondary | ICD-10-CM

## 2016-01-11 DIAGNOSIS — K219 Gastro-esophageal reflux disease without esophagitis: Secondary | ICD-10-CM | POA: Insufficient documentation

## 2016-01-11 DIAGNOSIS — I4891 Unspecified atrial fibrillation: Secondary | ICD-10-CM | POA: Insufficient documentation

## 2016-01-11 DIAGNOSIS — R197 Diarrhea, unspecified: Secondary | ICD-10-CM

## 2016-01-11 DIAGNOSIS — A09 Infectious gastroenteritis and colitis, unspecified: Secondary | ICD-10-CM | POA: Diagnosis not present

## 2016-01-11 DIAGNOSIS — R1314 Dysphagia, pharyngoesophageal phase: Secondary | ICD-10-CM

## 2016-01-11 DIAGNOSIS — G4700 Insomnia, unspecified: Secondary | ICD-10-CM | POA: Diagnosis not present

## 2016-01-11 DIAGNOSIS — Z853 Personal history of malignant neoplasm of breast: Secondary | ICD-10-CM | POA: Insufficient documentation

## 2016-01-11 IMAGING — RF DG SWALLOWING FUNCTION
1 series · 17 of 24 positions shown · non-contrast
Comparison: Esophagram dated [DATE]

CLINICAL DATA: Dysphagia.  Cough while eating.

EXAM:
MODIFIED BARIUM SWALLOW
TECHNIQUE: Different consistencies of barium were administered orally to the
patient by the Speech Pathologist. Imaging of the pharynx was
performed in the lateral projection.
FLUOROSCOPY TIME:  Fluoroscopy Time:  1 minutes 11 seconds

[Series 1: run · 14 acquisitions, 17 frames shown]
[im 1/14]
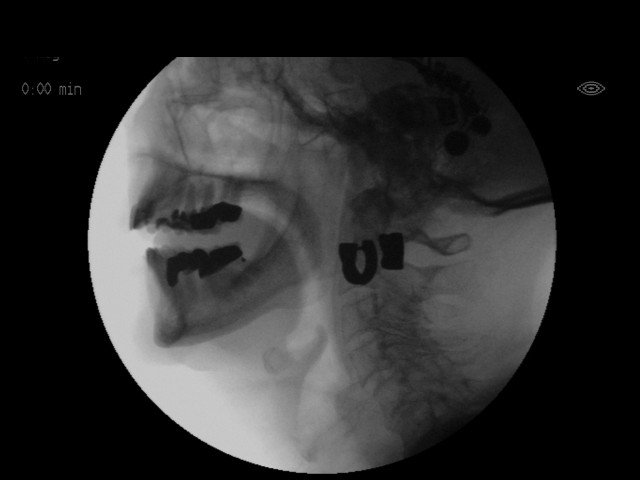
[im 2/14]
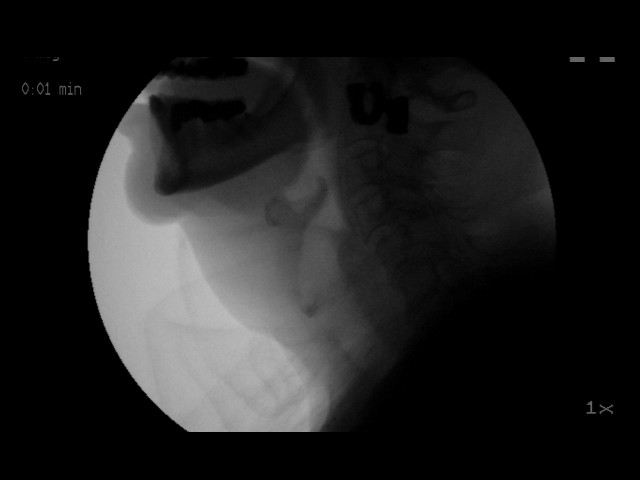
[im 2/14]
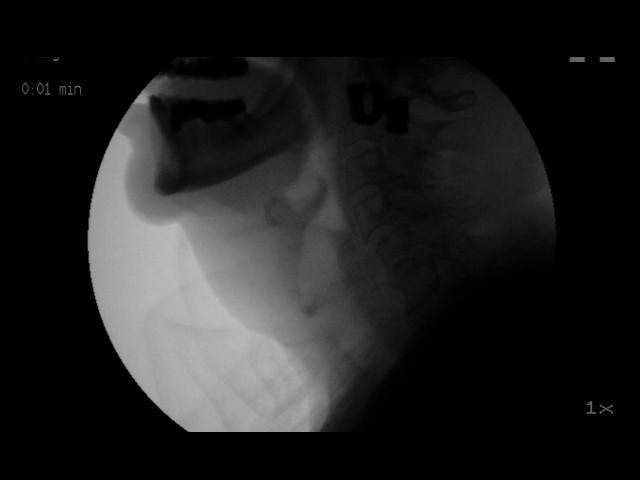
[im 3/14]
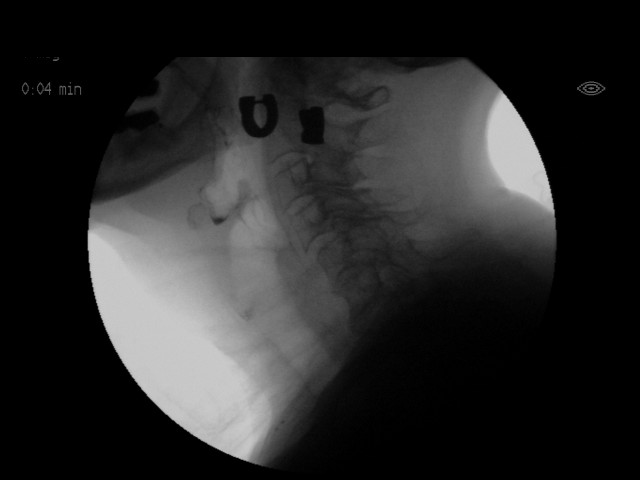
[im 4/14]
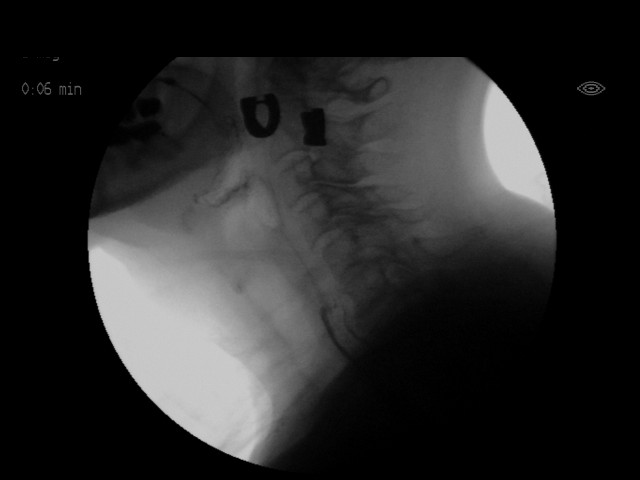
[im 5/14]
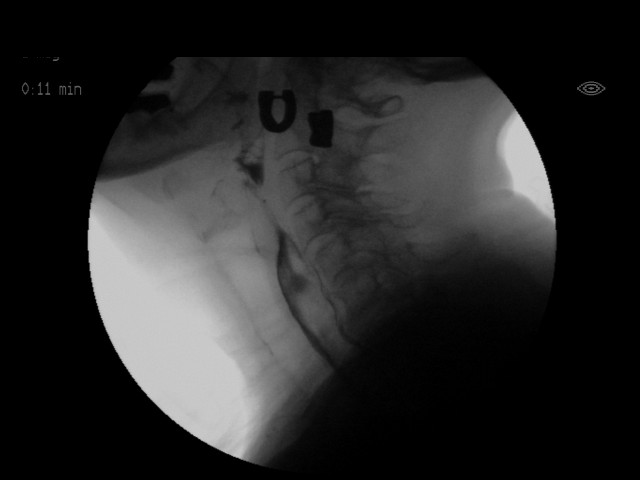
[im 6/14]
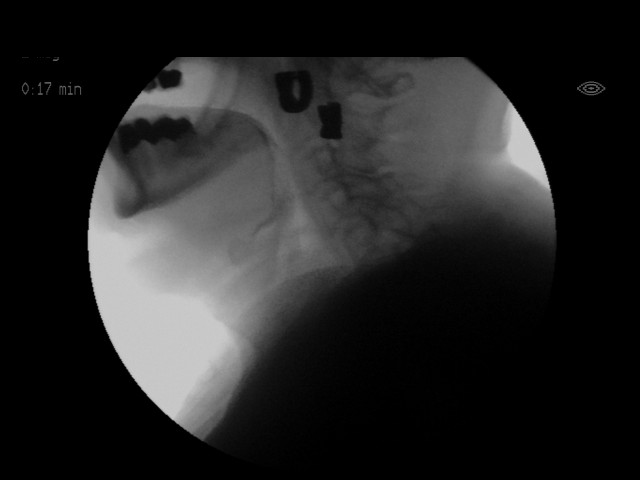
[im 7/14]
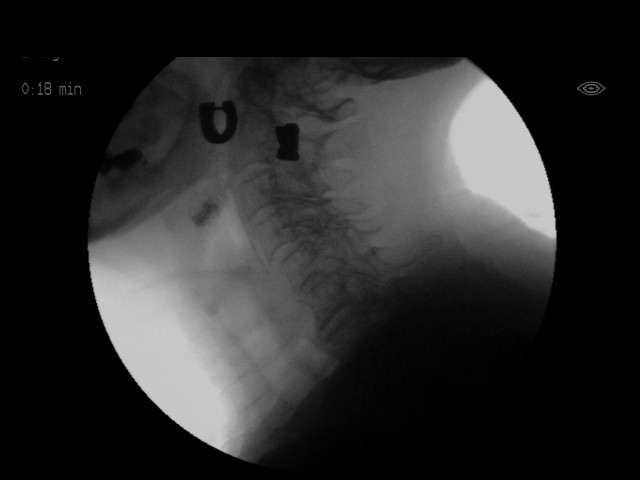
[im 8/14]
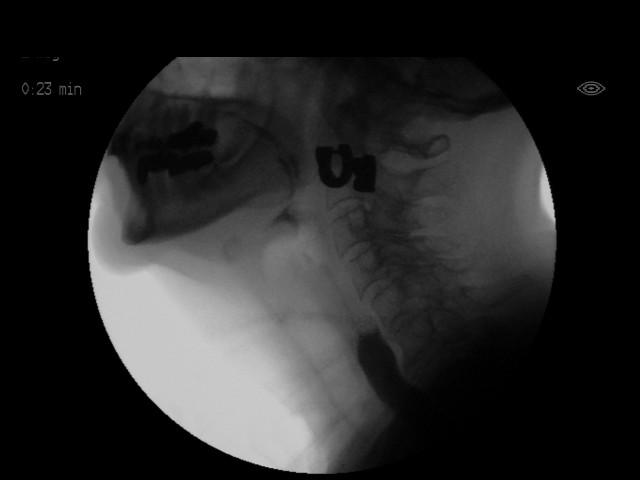
[im 8/14]
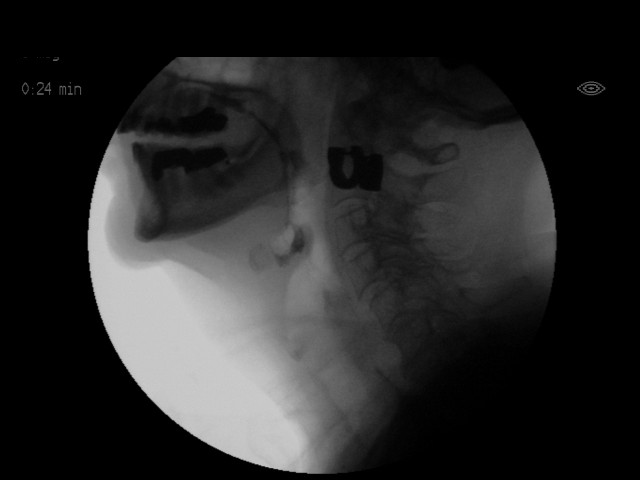
[im 9/14]
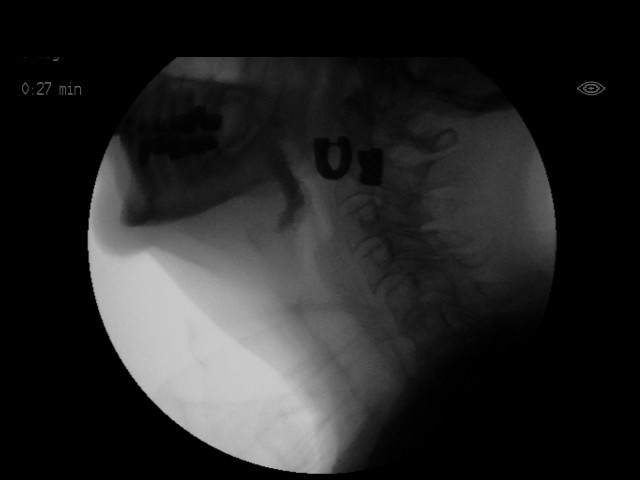
[im 10/14]
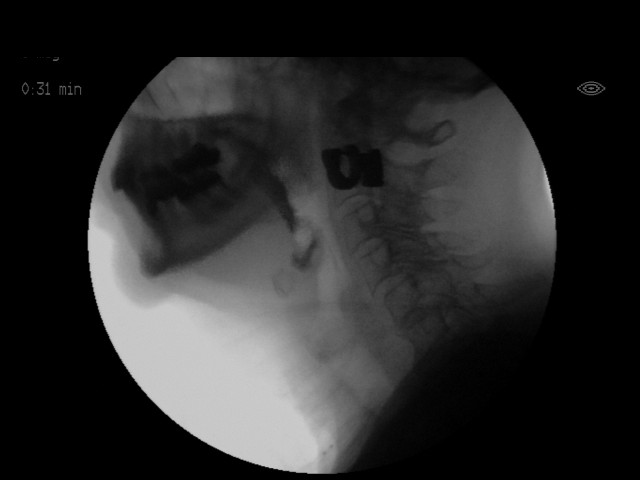
[im 11/14]
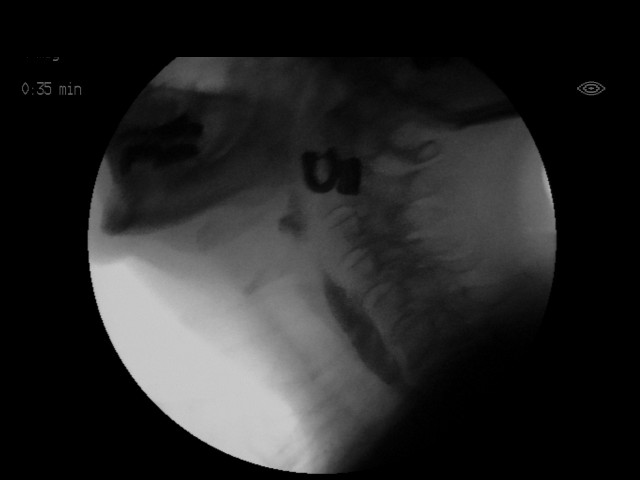
[im 12/14]
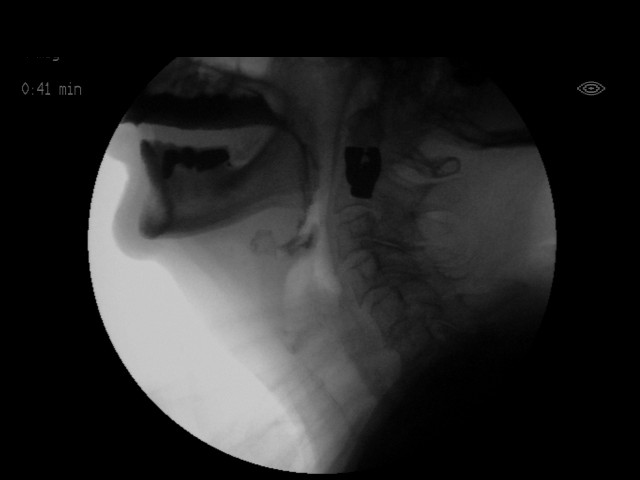
[im 13/14]
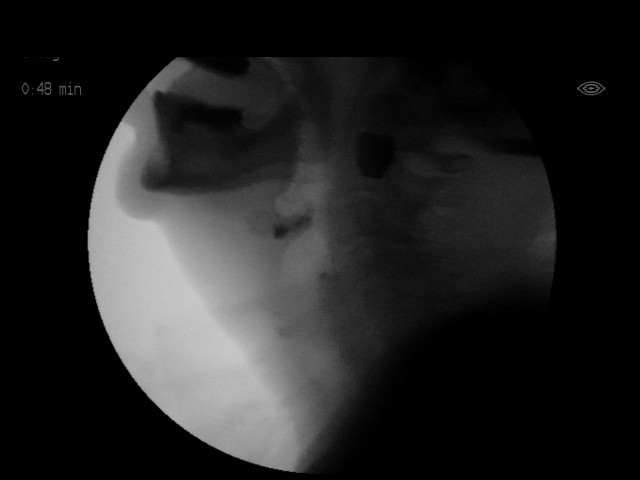
[im 13/14]
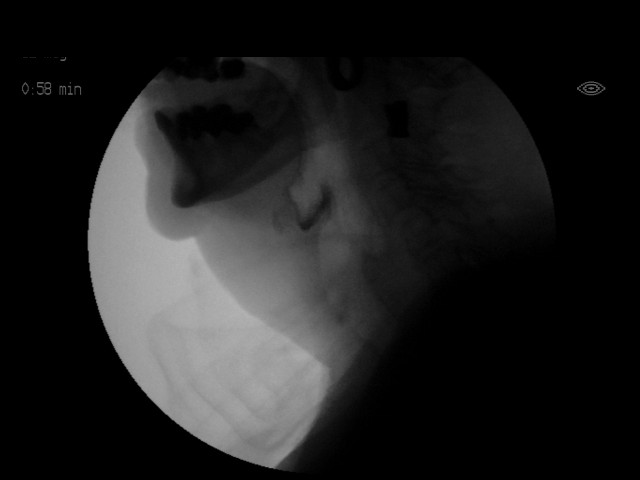
[im 14/14]
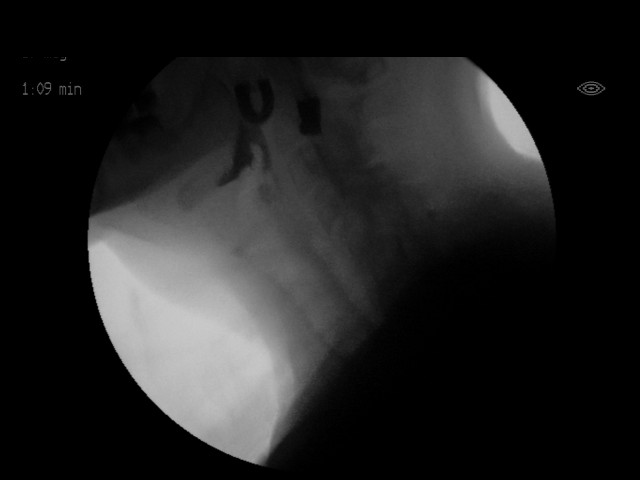

[17 of 24 positions shown; findings below may reference images not displayed]

FINDINGS: Thin liquid- patient had aspiration with thin liquids through a
straw.

MAKENNDO?MAKENNDO normal.

MAKENNDO?MAKENNDO with cracker- retention in the valleculae.

Barium tablet - aspiration when swallowing the barium tablet with
thin liquids. Large known hiatal hernia. No delay of passage of the
barium tablet into the stomach.
IMPRESSION: Aspiration of thin liquids.  Cracker retention in the valleculae.

Please refer to the Speech Pathologists report for complete details
and recommendations.

## 2016-01-11 NOTE — Progress Notes (Signed)
MBSS complete. Full report located under chart review in imaging section.  Aayra Hornbaker Paiewonsky, M.A. CCC-SLP [phone]  

## 2016-01-27 ENCOUNTER — Telehealth: Payer: Self-pay | Admitting: Gastroenterology

## 2016-01-28 NOTE — Telephone Encounter (Signed)
Left message on machine to call back  

## 2016-01-30 ENCOUNTER — Encounter: Payer: Self-pay | Admitting: Gastroenterology

## 2016-01-31 ENCOUNTER — Telehealth: Payer: Self-pay

## 2016-01-31 ENCOUNTER — Telehealth: Payer: Self-pay | Admitting: Cardiology

## 2016-01-31 ENCOUNTER — Encounter: Payer: Self-pay | Admitting: Physician Assistant

## 2016-01-31 ENCOUNTER — Other Ambulatory Visit: Payer: Self-pay

## 2016-01-31 DIAGNOSIS — R600 Localized edema: Secondary | ICD-10-CM | POA: Insufficient documentation

## 2016-01-31 MED ORDER — METRONIDAZOLE 500 MG PO TABS: 500.0000 mg | ORAL_TABLET | Freq: Two times a day (BID) | ORAL | 0 refills | Status: DC

## 2016-01-31 NOTE — Telephone Encounter (Signed)
[facility] states pt does not weigh daily, does have lower extremity edema and some wheezing. [facility] states pt complains of shortness of breath with exertion no shortness of breath at rest. Cassandra Ray states pt has also had diarrhea for 2 months, concerned about electrolyte imbalance.  I offered appt for pt today and 2 PM or tomorrow at 2 PM. [facility] Ray will discuss with pt and call back.

## 2016-01-31 NOTE — Progress Notes (Signed)
Cardiology Office Note    Date:  02/01/2016  ID:  Cassandra Ray, Cassandra Ray 10/03/1922, MRN [id number] PCP:   Cassandra Crutch, MD  Cardiologist:  Dr. [name]   Chief Complaint: SOB, tired  History of Present Illness:  Cassandra Ray is a 82 y.o. female with history of HTN, hypothyroidism, breast CA, GERD, diastolic dysfunction/probable chronic diastolic CHF, hyponatremia, pancreatic cyst who presents for follow-up.   Per review of Dr. [name] note in 2014 she was hospitalized in [facility] with nausea, vomiting, and weakness after eating seafood. She was also hypertensive. She underwent a stress test which showed no evidence of ischemia, low risk, normal EF. Echocardiogram showed EF of 55-60% with mild LVH, mild pulmonary hypertension, trace TR. She was diagnosed with A. fib RVR and was treated with IV Lopressor, although upon Dr. [name]' review of the strips, he could not clearly state that she had AF as there did appear to be P waves present. Abdominal ultrasound showed an incidental finding of pancreatic cyst with recommendations to repeat in 6-12 months. She underwent event monitor through December/January 2014/2015 which showed no evidence of atrial fibrillation. Admitted 2015 with SOB/hypertensive emergency/pulm edema requiring Lasix - echo with mod LVH, EF 65-70%, grade 2 DD, mild MR, mild LAE. Last labs 2015 showed Na 123, Cr 0.92.   She presents today at the urging of husband Cassandra Ray of 64 years who has noticed her slowing down the last few months. She appears younger than stated age. She reports exertional dyspnea now with minimal activity - gives example of getting out of the bath and getting ready for bed. Knee pain prevents significant level of activity, so she's rather sedentary. Stopping and resting helps ease the SOB. No chest pain. No orthopnea. + Chronic LEE. Has also noticed intermittent wheezing. No h/o tobacco abuse. Has been drinking one gatorade daily to keep electrolytes up given chronic  diarrhea. No syncope.   Weight is 173 today. Prior GI weights earlier this year were 156 but [name] wonders if this is because she's previously declined to step on the scale and the clinic used a weight from a prior visit. Prior cardiology office weights were 171 in 01/2015, 179lb in 07/2015.   Past Medical History:  Diagnosis Date  . Actinic keratoses   . Arthritis    back   . Atrial fibrillation ([place])    a. questionable episode in 2015 in [facility]. b. event monitor through December/January 2014/2015 which showed no evidence of atrial fibrillation.  . Breast cancer ([place])    Breast cancer (right)  . Chronic diastolic CHF (congestive heart failure) ([place])   . Compression fracture of L3 lumbar vertebra (HCC)   . GERD (gastroesophageal reflux disease)    takes otc Pepcid as needed  . Hiatal hernia 11/27/2014  . HTN (hypertension)   . Hypothyroidism   . Insomnia   . Osteopenia     Past Surgical History:  Procedure Laterality Date  . BREAST LUMPECTOMY Right 1995   lumpectomy  . COLONOSCOPY    . EYE SURGERY Bilateral    cataract w/ lens implant  . KYPHOPLASTY N/A 09/18/2013   Procedure: KYPHOPLASTY;  Surgeon: Sinclair Ship, MD;  Location: [place];  Service: Orthopedics;  Laterality: N/A;  Lumbar 3 kyphoplasty  . TONSILLECTOMY      Current Medications: Current Outpatient Prescriptions  Medication Sig Dispense Refill  . b complex vitamins tablet Take 1 tablet by mouth daily.    . Calcium Citrate-Vitamin D (CITRACAL + D  PO) Take 1 tablet by mouth daily. 630 mg/ 400mg     . cholecalciferol (VITAMIN D) 400 UNITS TABS tablet Take 1,200 Units by mouth daily.     . DULoxetine (CYMBALTA) 30 MG capsule Take 30 mg by mouth daily.     . furosemide (LASIX) 20 MG tablet Take 1 tablet (20 mg total) by mouth every other day. 30 tablet 0  . Hyoscyamine Sulfate 0.375 MG TBCR Take by mouth 2 (two) times daily.    Marland Kitchen losartan (COZAAR) 50 MG tablet Take 50 mg by mouth daily.    . Magnesium 250 MG  TABS Take 250 mg by mouth daily.    . Melatonin 5 MG TABS Take 1 tablet by mouth at bedtime as needed (sleep).    . metoprolol (LOPRESSOR) 100 MG tablet Take 100 mg by mouth 2 (two) times daily.    . metroNIDAZOLE (FLAGYL) 500 MG tablet Take 1 tablet (500 mg total) by mouth 2 (two) times daily. 21 tablet 0  . Multiple Vitamin (MULTIVITAMIN) tablet Take 1 tablet by mouth daily.    . pantoprazole (PROTONIX) 40 MG tablet Take 40 mg by mouth daily.    Marland Kitchen saccharomyces boulardii (FLORASTOR) 250 MG capsule Take 1 capsule (250 mg total) by mouth 2 (two) times daily. 60 capsule 0  . SYMBICORT 80-4.5 MCG/ACT inhaler Take 2 puffs by mouth 2 (two) times daily.    Marland Kitchen SYNTHROID 137 MCG tablet Take 1 tablet by mouth daily.     No current facility-administered medications for this visit.      Allergies:   Review of patient's allergies indicates no known allergies.   Social History   Social History  . Marital status: Married    Spouse name: N/A  . Number of children: N/A  . Years of education: N/A   Social History Main Topics  . Smoking status: Never Smoker  . Smokeless tobacco: Never Used  . Alcohol use 4.2 oz/week    7 Glasses of wine per week     Comment: 1 glass of wine a day  . Drug use: No  . Sexual activity: Not on file   Other Topics Concern  . Not on file   Social History Narrative  . No narrative on file     Family History:  The patient's family history includes CAD in her mother; Diabetes Mellitus II in her mother; Heart disease in her sister; Heart failure in her father and mother; Kidney cancer in her brother; Prostate cancer in her brother.   ROS:   Please see the history of present illness.  All other systems are reviewed and otherwise negative.    PHYSICAL EXAM:   VS:  BP (!) 142/86   Pulse 84   Ht 5\' 4"  (1.626 m)   Wt 173 lb 1.9 oz (78.5 kg)   BMI 29.72 kg/m   BMI: Body mass index is 29.72 kg/m. GEN: Well nourished, well developed WF in no acute distress  HEENT:  normocephalic, atraumatic Neck: no JVD, carotid bruits, or masses Cardiac: RRR; no murmurs, rubs, or gallops, trace bilateral LE edema  Respiratory:  Coarse, possible faint rales at bases, normal work of breathing GI: soft, nontender, nondistended, + BS MS: no deformity or atrophy  Skin: warm and dry, no rash Neuro:  Alert and Oriented x 3, Strength and sensation are intact, follows commands Psych: euthymic mood, full affect  Wt Readings from Last 3 Encounters:  02/01/16 173 lb 1.9 oz (78.5 kg)  01/03/16 156 lb (  70.8 kg)  12/21/15 156 lb (70.8 kg)      Studies/Labs Reviewed:   EKG:  EKG was ordered today and personally reviewed by me and demonstrate NSR 84bpm, left axis deviation, no acute changes.  Recent Labs: No results found for requested labs within last 8760 hours.   Lipid Panel No results found for: CHOL, TRIG, HDL, CHOLHDL, VLDL, LDLCALC, LDLDIRECT  Additional studies/ records that were reviewed today include: Summarized above.   ASSESSMENT & PLAN:   1. Shortness of breath/edema - suspicious for acute on chronic diastolic CHF but cannot exclude obstructive coronary disease. I don't think I believe the weights of 156 earlier this year. We don't have any labs on file since 2015 so I want to start with basic workup before adjusting meds. Will also send for CXR and obtain 2D echocardiogram. If evidence of HF, will plan to diurese. If BNP.labs unremarkable, will await echo to determine need for cath vs nuc. There may also be a component of deconditioning given age and decreased mobility related to knee issues.  2. Chronic diastolic CHF -  see above.  3. Essential HTN - will await labs in order to guide further management. Would not be too aggressive with BP control given her age but may need some gentle titration of meds. 4. Hyponatremia - f/u BMET today. 5. Atrial fib (possible) - questionable prior diagnosis, no evidence of clinical recurrence at this time.  Disposition:  F/u with me in 1 month. I also asked her to discuss further monitoring of her pancreatic pseudocyst with PCP as the last eval of this was 2016.   Medication Adjustments/Labs and Tests Ordered: Current medicines are reviewed at length with the patient today.  Concerns regarding medicines are outlined above. Medication changes, Labs and Tests ordered today are summarized above and listed in the Patient Instructions accessible in Encounters.   Raechel Ache PA-C  02/01/2016 2:31 PM    [facility] [place], [place], [place]  [address] Phone: [phone]; Fax: [phone]

## 2016-01-31 NOTE — Telephone Encounter (Signed)
Information sent to pt via mychart. RX sent as directed.

## 2016-01-31 NOTE — Telephone Encounter (Signed)
Dr. Loletha Carrow and [name]: Cassandra Ray continues to have serious diarrhea problem, especially in early AM and needs attention. Perhaps a refill of the prescription medication. I am also very concerned about her tiredness and shortness of breath and feel like she may need oxygen or a visit with a pulmonary MD. I called the office Thursday for [name] but she must not have gotten the message. Please respond ASAP since I called last week. Best number is [phone] or [email or web address]. Thanks very much.  This message came from the pt via mychart. Please advise.

## 2016-01-31 NOTE — Telephone Encounter (Signed)
Left message to call back for [facility].

## 2016-01-31 NOTE — Telephone Encounter (Signed)
Pt c/o Shortness Of Breath: STAT if SOB developed within the last 24 hours or pt is noticeably SOB on the phone  1. Are you currently SOB (can you hear that pt is SOB on the phone)? no 2. How long have you been experiencing SOB? Today  3. Are you SOB when sitting or when up moving around? Up moving around   4. Are you currently experiencing any other symptoms? Only when she is up moving around

## 2016-01-31 NOTE — Telephone Encounter (Signed)
I spoke with Cassandra Ray. Cassandra Ray states pt complained of shortness of breath this morning, O 2 Sat 97%, BP 124/66 HR 80.

## 2016-01-31 NOTE — Telephone Encounter (Signed)
She seemed to get better for a while after the metronidazole. I am willing to retreat:  500 mg tablets, one three times a day for 7 days. Disp #21, RF: 0  Her age and condition preclude endoscopic workup, so we will have to treat the symptoms best we can.   So if this does not help, then stop the hyoscyamine and take 1-2 imodium tablets twice a day as needed. I encourage them to see her PCP as soon as possible regarding concerns of fatigue and shortness of breath.

## 2016-01-31 NOTE — Telephone Encounter (Signed)
Cassandra Ray states pt prefers appt 02/01/16 at 2 PM with Cassandra Ray,. Cassandra Ray aware this appt has been scheduled for pt.

## 2016-02-01 ENCOUNTER — Ambulatory Visit
Admission: RE | Admit: 2016-02-01 | Discharge: 2016-02-01 | Disposition: A | Discharge status: Home / Self Care | Payer: Medicare Other | Source: Ambulatory Visit | Attending: Physician Assistant | Admitting: Physician Assistant

## 2016-02-01 ENCOUNTER — Encounter: Payer: Self-pay | Admitting: Physician Assistant

## 2016-02-01 ENCOUNTER — Ambulatory Visit (INDEPENDENT_AMBULATORY_CARE_PROVIDER_SITE_OTHER): Payer: Medicare Other | Admitting: Physician Assistant

## 2016-02-01 VITALS — BP 142/86 | HR 84 | Ht 64.0 in | Wt 173.1 lb

## 2016-02-01 DIAGNOSIS — R600 Localized edema: Secondary | ICD-10-CM

## 2016-02-01 DIAGNOSIS — I10 Essential (primary) hypertension: Secondary | ICD-10-CM

## 2016-02-01 DIAGNOSIS — I5032 Chronic diastolic (congestive) heart failure: Secondary | ICD-10-CM | POA: Diagnosis not present

## 2016-02-01 DIAGNOSIS — I4891 Unspecified atrial fibrillation: Secondary | ICD-10-CM

## 2016-02-01 DIAGNOSIS — R0602 Shortness of breath: Secondary | ICD-10-CM | POA: Diagnosis not present

## 2016-02-01 DIAGNOSIS — E871 Hypo-osmolality and hyponatremia: Secondary | ICD-10-CM

## 2016-02-01 LAB — CBC WITH DIFFERENTIAL/PLATELET
BASOS ABS: 0 {cells}/uL (ref 0–200)
Basophils Relative: 0 %
EOS PCT: 2 %
Eosinophils Absolute: 202 cells/uL (ref 15–500)
HCT: 38.8 % (ref 35.0–45.0)
Hemoglobin: 12.8 g/dL (ref 11.7–15.5)
LYMPHS PCT: 18 %
Lymphs Abs: 1818 cells/uL (ref 850–3900)
MCH: 31.5 pg (ref 27.0–33.0)
MCHC: 33 g/dL (ref 32.0–36.0)
MCV: 95.6 fL (ref 80.0–100.0)
MPV: 12 fL (ref 7.5–12.5)
Monocytes Absolute: 1818 cells/uL — ABNORMAL HIGH (ref 200–950)
Monocytes Relative: 18 %
NEUTROS PCT: 62 %
Neutro Abs: 6262 cells/uL (ref 1500–7800)
Platelets: 160 10*3/uL (ref 140–400)
RBC: 4.06 MIL/uL (ref 3.80–5.10)
RDW: 13.7 % (ref 11.0–15.0)
WBC: 10.1 10*3/uL (ref 3.8–10.8)

## 2016-02-01 LAB — COMPREHENSIVE METABOLIC PANEL
ALK PHOS: 120 U/L (ref 33–130)
ALT: 19 U/L (ref 6–29)
AST: 31 U/L (ref 10–35)
Albumin: 3.7 g/dL (ref 3.6–5.1)
BUN: 19 mg/dL (ref 7–25)
CALCIUM: 8.9 mg/dL (ref 8.6–10.4)
CO2: 27 mmol/L (ref 20–31)
Chloride: 99 mmol/L (ref 98–110)
Creat: 0.91 mg/dL — ABNORMAL HIGH (ref 0.60–0.88)
GLUCOSE: 78 mg/dL (ref 65–99)
POTASSIUM: 4.4 mmol/L (ref 3.5–5.3)
Sodium: 134 mmol/L — ABNORMAL LOW (ref 135–146)
Total Bilirubin: 0.8 mg/dL (ref 0.2–1.2)
Total Protein: 6.6 g/dL (ref 6.1–8.1)

## 2016-02-01 LAB — TSH: TSH: 0.72 m[IU]/L

## 2016-02-01 IMAGING — CR DG CHEST 2V
2 series · 2 of 2 positions shown · non-contrast
Comparison: [DATE], [DATE] and earlier.

CLINICAL DATA: [AGE] presenting with 1 month history of
shortness of breath. Prior malignant right breast lumpectomy and
axillary node dissection in [85].

EXAM:
CHEST  2 VIEW

[w chest pa]
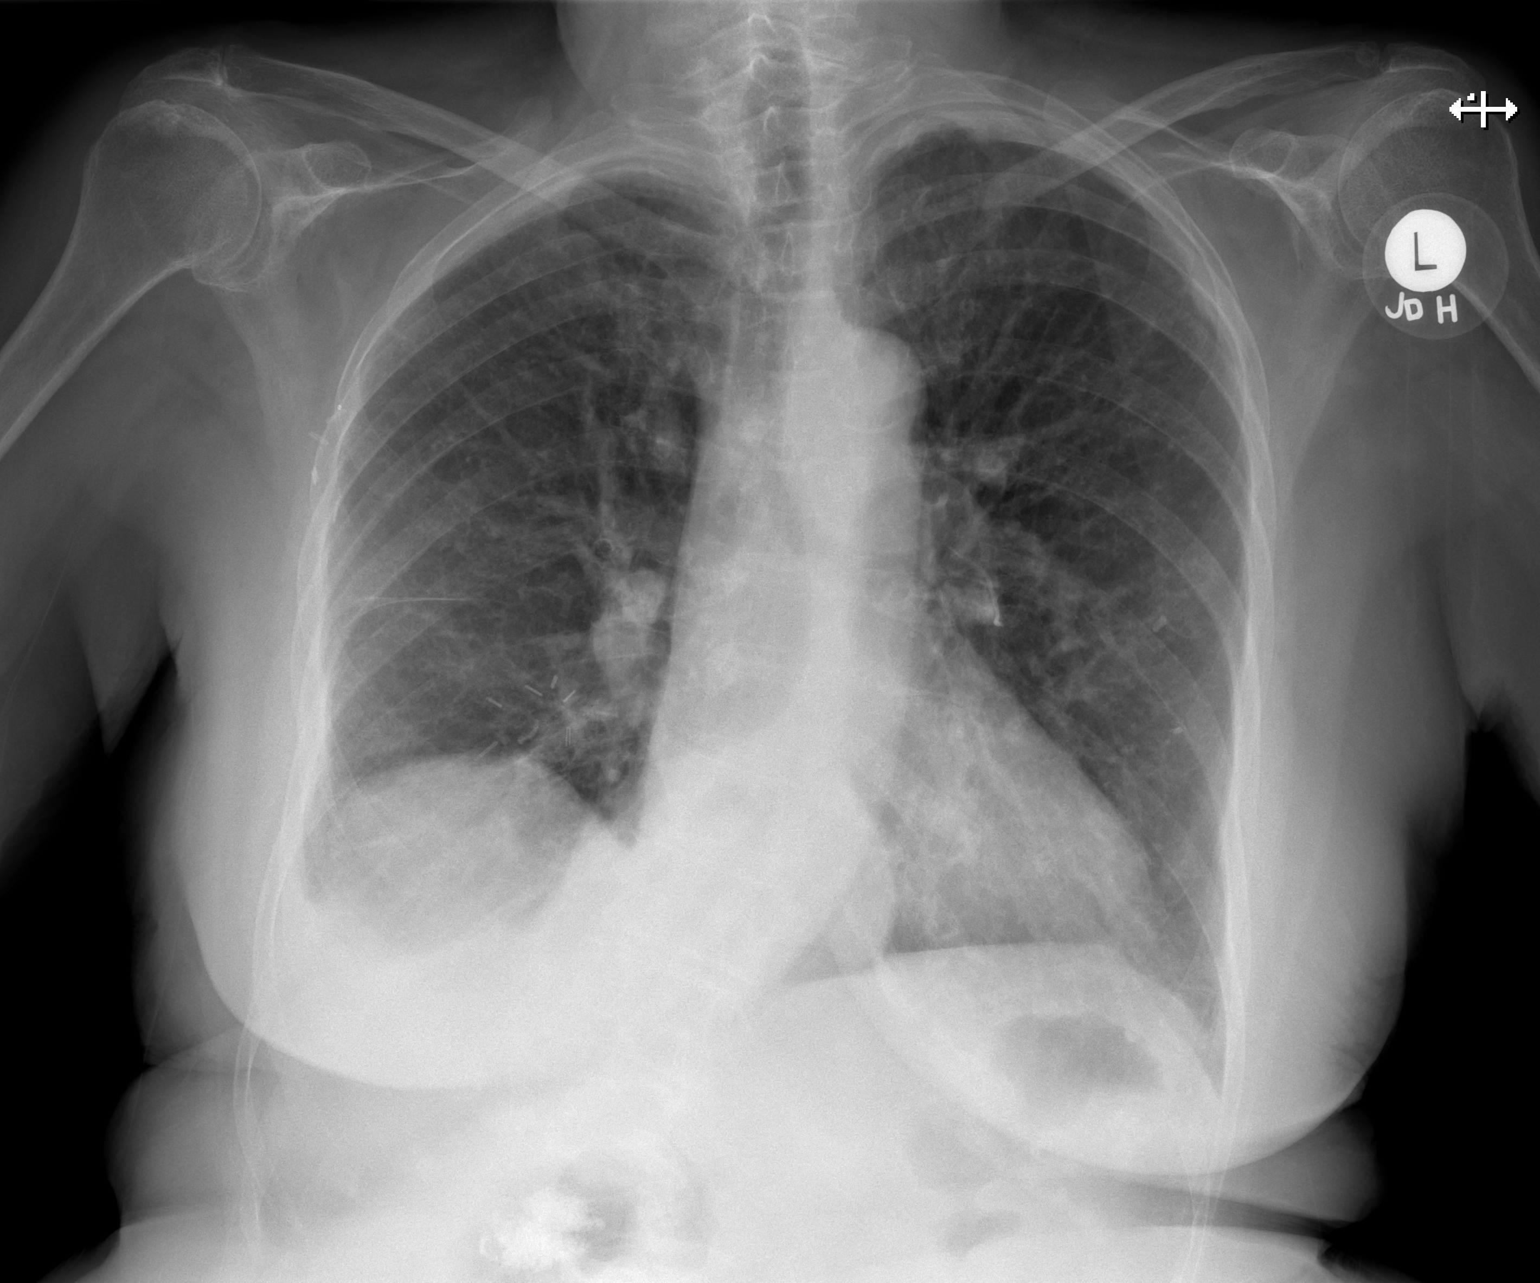

[w chest lat]
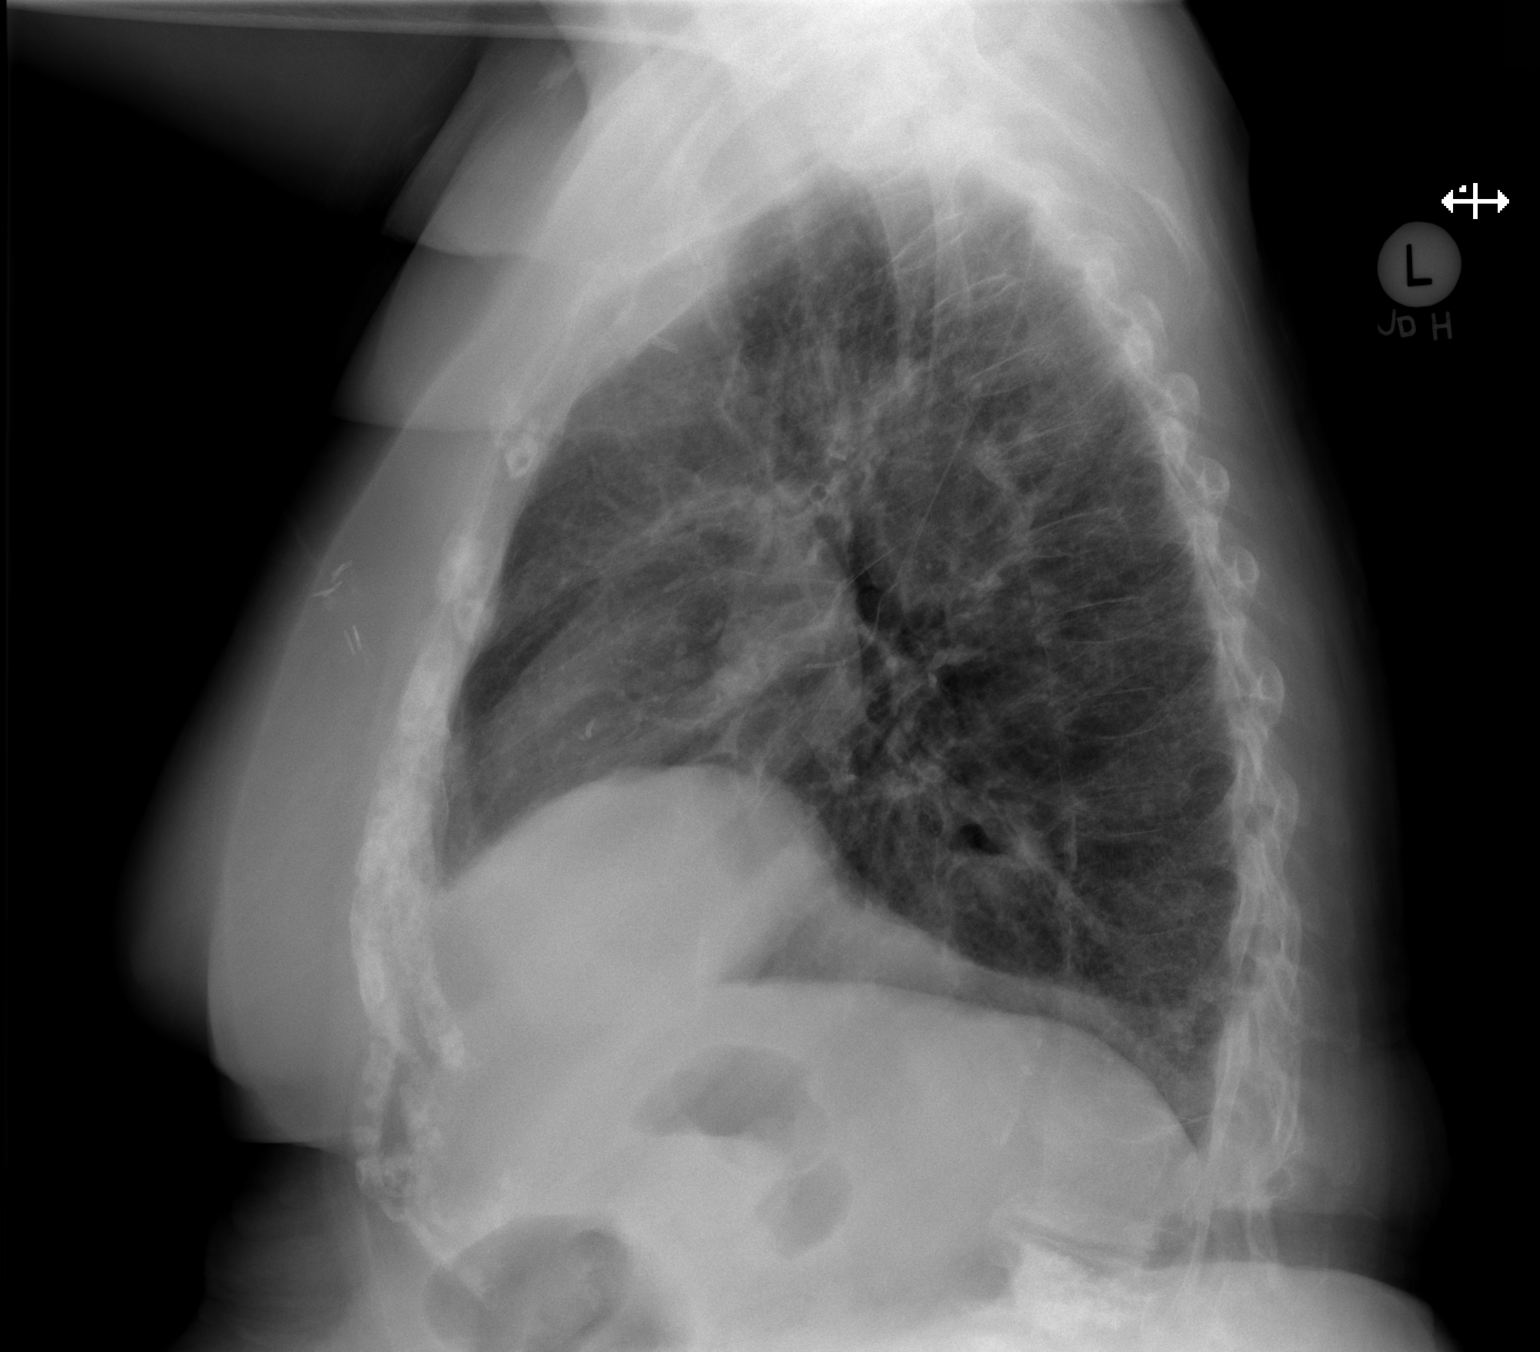

[2 of 2 positions shown; findings below may reference images not displayed]

FINDINGS: Cardiac silhouette normal in size, unchanged. Thoracic aorta mildly
atherosclerotic, unchanged. Small to moderate-sized hiatal hernia,
unchanged. Hilar and mediastinal contours otherwise unremarkable.
Moderate size right pleural effusion with associated minimal passive
atelectasis in the right lower lobe. Lungs otherwise clear.
Bronchovascular markings normal. Pulmonary vascularity normal. No
left pleural effusion. Surgical clips in the right breast and in the
right axilla from prior lumpectomy and node dissection. Thoracic
spine levoscoliosis with compensatory thoracolumbar dextroscoliosis
again noted.
IMPRESSION: 1. Moderate size right pleural effusion associated minimal passive
atelectasis in the right lower lobe. No acute cardiopulmonary
disease otherwise.
2. Mild thoracic aortic atherosclerosis.

## 2016-02-01 NOTE — Patient Instructions (Signed)
Lab work today ( cmet,cbc,tsh,bnp )  Chest Xray today at [facility] recommends that you schedule a follow-up appointment in: 1 month with Melina Copa PA

## 2016-02-01 NOTE — Addendum Note (Signed)
Addended by: Eulis Foster on: 02/01/2016 03:14 PM   Modules accepted: Orders

## 2016-02-02 ENCOUNTER — Other Ambulatory Visit: Payer: Self-pay

## 2016-02-02 ENCOUNTER — Telehealth: Payer: Self-pay | Admitting: *Deleted

## 2016-02-02 ENCOUNTER — Ambulatory Visit (HOSPITAL_COMMUNITY): Payer: Medicare Other | Attending: Cardiovascular Disease

## 2016-02-02 DIAGNOSIS — R0602 Shortness of breath: Secondary | ICD-10-CM | POA: Diagnosis not present

## 2016-02-02 DIAGNOSIS — I4891 Unspecified atrial fibrillation: Secondary | ICD-10-CM | POA: Diagnosis not present

## 2016-02-02 DIAGNOSIS — E039 Hypothyroidism, unspecified: Secondary | ICD-10-CM | POA: Diagnosis not present

## 2016-02-02 DIAGNOSIS — I110 Hypertensive heart disease with heart failure: Secondary | ICD-10-CM | POA: Diagnosis not present

## 2016-02-02 DIAGNOSIS — R600 Localized edema: Secondary | ICD-10-CM | POA: Diagnosis not present

## 2016-02-02 DIAGNOSIS — E781 Pure hyperglyceridemia: Secondary | ICD-10-CM | POA: Diagnosis not present

## 2016-02-02 DIAGNOSIS — I10 Essential (primary) hypertension: Secondary | ICD-10-CM | POA: Diagnosis not present

## 2016-02-02 DIAGNOSIS — I5032 Chronic diastolic (congestive) heart failure: Secondary | ICD-10-CM | POA: Diagnosis not present

## 2016-02-02 DIAGNOSIS — C50919 Malignant neoplasm of unspecified site of unspecified female breast: Secondary | ICD-10-CM | POA: Insufficient documentation

## 2016-02-02 DIAGNOSIS — E871 Hypo-osmolality and hyponatremia: Secondary | ICD-10-CM

## 2016-02-02 DIAGNOSIS — R0600 Dyspnea, unspecified: Secondary | ICD-10-CM | POA: Diagnosis present

## 2016-02-02 LAB — BRAIN NATRIURETIC PEPTIDE: BRAIN NATRIURETIC PEPTIDE: 173.3 pg/mL — AB (ref <100)

## 2016-02-02 MED ORDER — FUROSEMIDE 20 MG PO TABS: ORAL_TABLET | ORAL | 1 refills | Status: DC

## 2016-02-02 NOTE — Addendum Note (Signed)
Addended by: Zebedee Iba on: 02/02/2016 12:45 PM   Modules accepted: Orders

## 2016-02-02 NOTE — Telephone Encounter (Signed)
See My Chart message, Magdalene River CMA sent to Dr Loletha Carrow and Dr Loletha Carrow response

## 2016-02-02 NOTE — Telephone Encounter (Signed)
Pt and husband have been made aware of her lab results and that DD wants to get her echo moved up sooner than 02/15/16. Luckily, we had a cancellation today, arriving at 2:45 and pt husband agreed with this and will have pt at this appt. Pt will increase Lasix to 40 mg q day X's 3 days then go back to 20 mg daily. Will discuss moving f/u appt up when pt arrives.

## 2016-02-03 ENCOUNTER — Telehealth: Payer: Self-pay | Admitting: Cardiology

## 2016-02-03 ENCOUNTER — Encounter: Payer: Self-pay | Admitting: Physician Assistant

## 2016-02-03 NOTE — Telephone Encounter (Signed)
New message      Pt husband returning nurse call.

## 2016-02-04 NOTE — Telephone Encounter (Signed)
Returned husbands call 02/03/16 and went over echo results for pt.  He has been advised to stick with the current plan with diuretic and to f/u in the office with DD next week. He verbalized understanding.

## 2016-02-05 ENCOUNTER — Encounter: Payer: Self-pay | Admitting: Physician Assistant

## 2016-02-08 ENCOUNTER — Encounter: Payer: Self-pay | Admitting: Physician Assistant

## 2016-02-08 DIAGNOSIS — I5033 Acute on chronic diastolic (congestive) heart failure: Secondary | ICD-10-CM | POA: Insufficient documentation

## 2016-02-08 DIAGNOSIS — J90 Pleural effusion, not elsewhere classified: Secondary | ICD-10-CM | POA: Insufficient documentation

## 2016-02-08 NOTE — Progress Notes (Deleted)
Cardiology Office Note    Date:  02/08/2016  ID:  Ray, Cassandra October 02, 1922, MRN [id number] PCP:   Cassandra Crutch, MD  Cardiologist:  Cassandra Ray  ***refresh  - This is a preliminary note being prepped for an upcoming appointment.   Chief Complaint: f/u SOB  History of Present Illness:  Cassandra Ray is a 82 y.o. female with history of HTN, hypothyroidism, breast CA, GERD, diastolic dysfunction/probable chronic diastolic CHF, hyponatremia, pancreatic cyst who presents for follow-up.   Per review of Dr. Marlou Ray note in 2014 she was hospitalized in [facility] with nausea, vomiting, and weakness after eating seafood. She was also hypertensive. She underwent a stress test which showed no evidence of ischemia, low risk, normal EF. Echocardiogram showed EF of 55-60% with mild LVH, mild pulmonary hypertension, trace TR. She was diagnosed with A. fib RVR and was treated with IV Lopressor, although upon Dr. Marlou Ray' review of the strips, he could not clearly state that she had AF as there did appear to be P waves present. Abdominal ultrasound showed an incidental finding of pancreatic cyst with recommendations to repeat in 6-12 months. She underwent event monitor through December/January 2014/2015 which showed no evidence of atrial fibrillation. Admitted 2015 with SOB/hypertensive emergency/pulm edema requiring Lasix - echo with mod LVH, EF 65-70%, grade 2 DD, mild MR, mild LAE. Notable labs 2015 showed Na 123, Cr 0.92.   I saw her in clinic 02/01/16 for "slowing down" over the last few months with increasing exertional dyspnea. Knee pain prevents significant level of activity, so she's rather sedentary. Has chronic unchanged LEE. EKG NSR. CXR showed a moderate right pleural effusion. BNP was 173, CBC OK, Na 134, Cr 0.91 -> asked to increase Lasix to 40mg  daily (from 20mg  QOD). I also asked her to discuss further monitoring of her pancreatic pseudocyst with PCP as the last eval of this was 2016.   cmet   ldh Pleural eff labs  Past Medical History:  Diagnosis Date  . Actinic keratoses   . Arthritis    back   . Atrial fibrillation ([place])    a. questionable episode in 2015 in [facility]. b. event monitor through December/January 2014/2015 which showed no evidence of atrial fibrillation.  . Breast cancer ([place])    Breast cancer (right)  . Chronic diastolic CHF (congestive heart failure) ([place])   . Compression fracture of L3 lumbar vertebra ([facility])   . GERD (gastroesophageal reflux disease)    takes otc Pepcid as needed  . Hiatal hernia 11/27/2014  . HTN (hypertension)   . Hypothyroidism   . Insomnia   . Osteopenia     Past Surgical History:  Procedure Laterality Date  . BREAST LUMPECTOMY Right 1995   lumpectomy  . COLONOSCOPY    . EYE SURGERY Bilateral    cataract w/ lens implant  . KYPHOPLASTY N/A 09/18/2013   Procedure: KYPHOPLASTY;  Surgeon: Sinclair Ship, MD;  Location: [place];  Service: Orthopedics;  Laterality: N/A;  Lumbar 3 kyphoplasty  . TONSILLECTOMY      Current Medications: Current Outpatient Prescriptions  Medication Sig Dispense Refill  . b complex vitamins tablet Take 1 tablet by mouth daily.    . Calcium Citrate-Vitamin D (CITRACAL + D PO) Take 1 tablet by mouth daily. 630 mg/ 400mg     . cholecalciferol (VITAMIN D) 400 UNITS TABS tablet Take 1,200 Units by mouth daily.     . DULoxetine (CYMBALTA) 30 MG capsule Take 30 mg by mouth daily.     Marland Kitchen  furosemide (LASIX) 20 MG tablet Take 2 tablets by mouth daily X's 3 days then go back to 1 tablet by mouth daily 96 tablet 1  . Hyoscyamine Sulfate 0.375 MG TBCR Take by mouth 2 (two) times daily.    Marland Kitchen losartan (COZAAR) 50 MG tablet Take 50 mg by mouth daily.    . Magnesium 250 MG TABS Take 250 mg by mouth daily.    . Melatonin 5 MG TABS Take 1 tablet by mouth at bedtime as needed (sleep).    . metoprolol (LOPRESSOR) 100 MG tablet Take 100 mg by mouth 2 (two) times daily.    . metroNIDAZOLE (FLAGYL) 500 MG tablet Take 1  tablet (500 mg total) by mouth 2 (two) times daily. 21 tablet 0  . Multiple Vitamin (MULTIVITAMIN) tablet Take 1 tablet by mouth daily.    . pantoprazole (PROTONIX) 40 MG tablet Take 40 mg by mouth daily.    Marland Kitchen saccharomyces boulardii (FLORASTOR) 250 MG capsule Take 1 capsule (250 mg total) by mouth 2 (two) times daily. 60 capsule 0  . SYMBICORT 80-4.5 MCG/ACT inhaler Take 2 puffs by mouth 2 (two) times daily.    Marland Kitchen SYNTHROID 137 MCG tablet Take 1 tablet by mouth daily.     No current facility-administered medications for this visit.      Allergies:   Review of patient's allergies indicates no known allergies.   Social History   Social History  . Marital status: Married    Spouse name: N/A  . Number of children: N/A  . Years of education: N/A   Social History Main Topics  . Smoking status: Never Smoker  . Smokeless tobacco: Never Used  . Alcohol use 4.2 oz/week    7 Glasses of wine per week     Comment: 1 glass of wine a day  . Drug use: No  . Sexual activity: Not on file   Other Topics Concern  . Not on file   Social History Narrative  . No narrative on file     Family History:  The patient's family history includes CAD in her mother; Diabetes Mellitus II in her mother; Heart disease in her sister; Heart failure in her father and mother; Kidney cancer in her brother; Prostate cancer in her brother. ***  ROS:   Please see the history of present illness. Otherwise, review of systems is positive for ***.  All other systems are reviewed and otherwise negative.    PHYSICAL EXAM:   VS:  There were no vitals taken for this visit.  BMI: There is no height or weight on file to calculate BMI. GEN: Well nourished, well developed, in no acute distress  HEENT: normocephalic, atraumatic Neck: no JVD, carotid bruits, or masses Cardiac: ***RRR; no murmurs, rubs, or gallops, no edema  Respiratory:  clear to auscultation bilaterally, normal work of breathing GI: soft, nontender,  nondistended, + BS MS: no deformity or atrophy  Skin: warm and dry, no rash Neuro:  Alert and Oriented x 3, Strength and sensation are intact, follows commands Psych: euthymic mood, full affect  Wt Readings from Last 3 Encounters:  02/01/16 173 lb 1.9 oz (78.5 kg)  01/03/16 156 lb (70.8 kg)  12/21/15 156 lb (70.8 kg)      Studies/Labs Reviewed:   EKG:  EKG was ordered today and personally reviewed by me and demonstrates *** EKG was not ordered today.***  Recent Labs: 02/01/2016: ALT 19; Brain Natriuretic Peptide 173.3; BUN 19; Creat 0.91; Hemoglobin 12.8; Platelets 160;  Potassium 4.4; Sodium 134; TSH 0.72   Lipid Panel No results found for: CHOL, TRIG, HDL, CHOLHDL, VLDL, LDLCALC, LDLDIRECT  Additional studies/ records that were reviewed today include: Summarized above.***    ASSESSMENT & PLAN:   1. Pleural effusion 2. Acute on chronic diastolic CHF 3. Essential HTN 4. Hyponatremia  Disposition: F/u with ***   Medication Adjustments/Labs and Tests Ordered: Current medicines are reviewed at length with the patient today.  Concerns regarding medicines are outlined above. Medication changes, Labs and Tests ordered today are summarized above and listed in the Patient Instructions accessible in Encounters.   Raechel Ache PA-C  02/08/2016 12:04 PM    [facility] [address], [place], [place]  [address] Phone: [phone]; Fax: [phone]

## 2016-02-09 ENCOUNTER — Ambulatory Visit (INDEPENDENT_AMBULATORY_CARE_PROVIDER_SITE_OTHER): Payer: Medicare Other | Admitting: Physician Assistant

## 2016-02-09 ENCOUNTER — Encounter: Payer: Self-pay | Admitting: Physician Assistant

## 2016-02-09 ENCOUNTER — Encounter: Payer: Self-pay | Admitting: *Deleted

## 2016-02-09 ENCOUNTER — Encounter: Payer: Self-pay | Admitting: Gastroenterology

## 2016-02-09 VITALS — BP 140/90 | HR 78 | Ht 64.0 in | Wt 174.0 lb

## 2016-02-09 DIAGNOSIS — E871 Hypo-osmolality and hyponatremia: Secondary | ICD-10-CM | POA: Diagnosis not present

## 2016-02-09 DIAGNOSIS — J948 Other specified pleural conditions: Secondary | ICD-10-CM

## 2016-02-09 DIAGNOSIS — J90 Pleural effusion, not elsewhere classified: Secondary | ICD-10-CM

## 2016-02-09 DIAGNOSIS — I10 Essential (primary) hypertension: Secondary | ICD-10-CM | POA: Diagnosis not present

## 2016-02-09 LAB — COMPREHENSIVE METABOLIC PANEL
ALBUMIN: 3.4 g/dL — AB (ref 3.6–5.1)
ALT: 11 U/L (ref 6–29)
AST: 21 U/L (ref 10–35)
Alkaline Phosphatase: 74 U/L (ref 33–130)
BUN: 14 mg/dL (ref 7–25)
CALCIUM: 9 mg/dL (ref 8.6–10.4)
CHLORIDE: 99 mmol/L (ref 98–110)
CO2: 30 mmol/L (ref 20–31)
Creat: 0.97 mg/dL — ABNORMAL HIGH (ref 0.60–0.88)
Glucose, Bld: 71 mg/dL (ref 65–99)
POTASSIUM: 4.1 mmol/L (ref 3.5–5.3)
SODIUM: 137 mmol/L (ref 135–146)
Total Bilirubin: 0.4 mg/dL (ref 0.2–1.2)
Total Protein: 6.4 g/dL (ref 6.1–8.1)

## 2016-02-09 LAB — LACTATE DEHYDROGENASE: LDH: 164 U/L (ref 120–250)

## 2016-02-09 NOTE — Progress Notes (Signed)
174.4LB

## 2016-02-09 NOTE — Patient Instructions (Addendum)
Medication Instructions:  Your physician recommends that you continue on your current medications as directed. Please refer to the Current Medication list given to you today.   Labwork: TODAY;  CMET & LDH  Testing/Procedures: You will be scheduled for a Thoracentesis (where they will draw fluid off) This will be done at [facility].  A chest x-ray takes a picture of the organs and structures inside the chest, including the heart, lungs, and blood vessels. This test can show several things, including, whether the heart is enlarges; whether fluid is building up in the lungs; and whether pacemaker / defibrillator leads are still in place.   Follow-Up: Your physician recommends that you schedule a follow-up appointment in: 2-3 MONTHS WITH DR. [name]    Any Other Special Instructions Will Be Listed Below (If Applicable).    If you need a refill on your cardiac medications before your next appointment, please call your pharmacy.

## 2016-02-09 NOTE — Progress Notes (Addendum)
Cardiology Office Note    Date:  02/09/2016  ID:  Cassandra Ray, DOB 09/12/1922, MRN [id number] PCP:   Melinda Crutch, MD  Cardiologist: [name]  Chief Complaint: SOB  History of Present Illness:  Cassandra Ray is a 82 y.o. female with history of HTN, hypothyroidism, breast CA, GERD, diastolic dysfunction/probable chronic diastolic CHF, hyponatremia, pancreatic cyst who presents for follow-up.   Per review of Dr. [name] note in 2014 she was hospitalized in [facility] with nausea, vomiting, and weakness after eating seafood. She was also hypertensive. She underwent a stress test which showed no evidence of ischemia, low risk, normal EF. Echocardiogram showed EF of 55-60% with mild LVH, mild pulmonary hypertension, trace TR. She was diagnosed with A. fib RVR and was treated with IV Lopressor, although upon Dr. [name]' review of the strips, he could not clearly state that she had AF as there did appear to be P waves present. Abdominal ultrasound showed an incidental finding of pancreatic cyst with recommendations to repeat in 6-12 months. She underwent event monitor through December/January 2014/2015 which showed no evidence of atrial fibrillation. She was admitted2015 with SOB/hypertensive emergency/pulm edema requiring Lasix - echo with mod LVH, EF 65-70%, grade 2 DD, mild MR, mild LAE. Notable labs 2015 showed Na 123.  More recently I saw her in clinic 02/01/16 for "slowing down" over the last few months with increasing exertional dyspnea, even with minimal activity now. Knee pain prevents significant level of activity, so she's rather sedentary. + Chronic unchanged LEE. EKG NSR. CXR showed a moderate right pleural effusion. BNP was 173, CBC OK, Na 134, Cr 0.91 -> asked to increase Lasix to 40mg  daily (from 20mg  QOD). 2d echo 02/02/16: mod LVH, EF 60-65%, grade 2 DD, normal wall motion. Of note, she did not believe the weight of 156 in her chart was accurate from before - prior office weights were 171 in  01/2015, 179lb in 07/2015.  She presents back for follow-up today - weight up 1 lb but wearing shoes this time. She continues to feel the same. Her husband Cassandra Ray wanted to go for a walk with her the other day but she declined due to fatigue. She continues to have no energy. Even getting ready for bed at night leaves her huffing and puffing. She denies any orthopnea, though - says her SOB improves once she has laid in the bed and rested for a bit. No chest pain reported. She does note drenching night sweats despite knocking their home temp down to 67 degrees at night. She has been under the care of GI for chronic diarrhea lately, being treated with Flagyl.  Past Medical History:  Diagnosis Date  . Actinic keratoses   . Arthritis    back   . Atrial fibrillation ([place])    a. questionable episode in 2015 in [facility]. b. event monitor through December/January 2014/2015 which showed no evidence of atrial fibrillation.  . Breast cancer ([place])    Breast cancer (right)  . Chronic diastolic CHF (congestive heart failure) ([place])   . Compression fracture of L3 lumbar vertebra (HCC)   . GERD (gastroesophageal reflux disease)    takes otc Pepcid as needed  . Hiatal hernia 11/27/2014  . HTN (hypertension)   . Hypothyroidism   . Insomnia   . Osteopenia     Past Surgical History:  Procedure Laterality Date  . BREAST LUMPECTOMY Right 1995   lumpectomy  . COLONOSCOPY    . EYE SURGERY Bilateral    cataract  w/ lens implant  . KYPHOPLASTY N/A 09/18/2013   Procedure: KYPHOPLASTY;  Surgeon: Sinclair Ship, MD;  Location: [facility];  Service: Orthopedics;  Laterality: N/A;  Lumbar 3 kyphoplasty  . TONSILLECTOMY      Current Medications: Current Outpatient Prescriptions  Medication Sig Dispense Refill  . b complex vitamins tablet Take 1 tablet by mouth daily.    . Calcium Citrate-Vitamin D (CITRACAL + D PO) Take 1 tablet by mouth daily. 630 mg/ 400mg     . cholecalciferol (VITAMIN D) 400 UNITS TABS tablet Take  1,200 Units by mouth daily.     . DULoxetine (CYMBALTA) 30 MG capsule Take 30 mg by mouth daily.     . furosemide (LASIX) 20 MG tablet Take 2 tablets by mouth daily X's 3 days then go back to 1 tablet by mouth daily 96 tablet 1  . Hyoscyamine Sulfate 0.375 MG TBCR Take by mouth 2 (two) times daily.    Marland Kitchen losartan (COZAAR) 50 MG tablet Take 50 mg by mouth daily.    . Magnesium 250 MG TABS Take 250 mg by mouth daily.    . Melatonin 5 MG TABS Take 1 tablet by mouth at bedtime as needed (sleep).    . metoprolol (LOPRESSOR) 100 MG tablet Take 100 mg by mouth 2 (two) times daily.    . metroNIDAZOLE (FLAGYL) 500 MG tablet Take 1 tablet (500 mg total) by mouth 2 (two) times daily. 21 tablet 0  . Multiple Vitamin (MULTIVITAMIN) tablet Take 1 tablet by mouth daily.    . pantoprazole (PROTONIX) 40 MG tablet Take 40 mg by mouth daily.    Marland Kitchen saccharomyces boulardii (FLORASTOR) 250 MG capsule Take 1 capsule (250 mg total) by mouth 2 (two) times daily. 60 capsule 0  . SYMBICORT 80-4.5 MCG/ACT inhaler Take 2 puffs by mouth 2 (two) times daily.    Marland Kitchen SYNTHROID 137 MCG tablet Take 1 tablet by mouth daily.     No current facility-administered medications for this visit.      Allergies:   Review of patient's allergies indicates no known allergies.   Social History   Social History  . Marital status: Married    Spouse name: N/A  . Number of children: N/A  . Years of education: N/A   Social History Main Topics  . Smoking status: Never Smoker  . Smokeless tobacco: Never Used  . Alcohol use 4.2 oz/week    7 Glasses of wine per week     Comment: 1 glass of wine a day  . Drug use: No  . Sexual activity: Not Asked   Other Topics Concern  . None   Social History Narrative  . None     Family History:  The patient's family history includes CAD in her mother; Diabetes Mellitus II in her mother; Heart disease in her sister; Heart failure in her father and mother; Kidney cancer in her brother; Prostate  cancer in her brother.   ROS:   Please see the history of present illness. Night sweats as above. Diarrhea. No melena. All other systems are reviewed and otherwise negative.    PHYSICAL EXAM:   VS:  BP 140/90 (BP Location: Left Arm, Patient Position: Sitting, Cuff Size: Normal)   Pulse 78   Ht 5\' 4"  (1.626 m)   Wt 174 lb (78.9 kg)   SpO2 97%   BMI 29.87 kg/m   BMI: Body mass index is 29.87 kg/m. GEN: Well nourished, well developed WF in no acute distress  HEENT: normocephalic, atraumatic Neck: no JVD, carotid bruits, or masses Cardiac: RRR; no murmurs, rubs, or gallops, no edema  Respiratory:  Coarse BS bilaterally with diminished BS 1/3 of the way upon the right, clear to auscultation bilaterally, normal work of breathing GI: soft, nontender, nondistended, + BS MS: no deformity or atrophy  Skin: warm and dry, no rash Neuro:  Alert and Oriented x 3, Strength and sensation are intact, follows commands Psych: euthymic mood, full affect  Wt Readings from Last 3 Encounters:  02/09/16 174 lb (78.9 kg)  02/01/16 173 lb 1.9 oz (78.5 kg)  01/03/16 156 lb (70.8 kg)      Studies/Labs Reviewed:   EKG:   EKG was not ordered today.  Recent Labs: 02/01/2016: ALT 19; Brain Natriuretic Peptide 173.3; BUN 19; Creat 0.91; Hemoglobin 12.8; Platelets 160; Potassium 4.4; Sodium 134; TSH 0.72   Lipid Panel No results found for: CHOL, TRIG, HDL, CHOLHDL, VLDL, LDLCALC, LDLDIRECT  Additional studies/ records that were reviewed today include: Summarized above.    ASSESSMENT & PLAN:   1. Pleural effusion - she has not noticed much symptomatic change with increased Lasix  Her BNP was only 173 - so while there could be a component of CHF at play here, I still wonder if there is another culprit in play here. Her night sweats are concerning to me. I feel she would benefit from diagnostic thoracentesis which would provide therapeutic benefit as well. We have placed the order to include cytology,  culture, gram stain, LDH, amylase, glucose, and protein. Will also check baseline serum CMET and LDH today to be able to compare to pleural fluid. I discussed the case with Dr. Rayann Heman who agrees this is reasonable to proceed. Discussed in depth with the patient and her family. Risks & benefits discussed. She would like to proceed. I did ask her to discuss her night sweats with her primary care physician as this could be a sign of underlying malignancy. 2. Chronic diastolic CHF - as above. Will continue Lasix at present dose for now. Sodium, fluid restriction reinforced. 3. Essential HTN - her BP may decrease with thoracentesis so I will leave regimen as is for now. I instructed her and her husband to follow BP and call if tending to run >135/85. Would consider increasing losartan if this were the case (vs escalation of diuretic if effusion is found to be transudative and r/t CHF). 4. Hyponatremia - recheck with labs today.  Disposition: F/u with Dr. [name] in 2-3 months. Closer timing of f/u TBD based on thoracentesis result. I reiterated need for her to discuss further monitoring of her pancreatic pseudocyst with PCP/GI as the last eval of this was 2016.   Medication Adjustments/Labs and Tests Ordered: Current medicines are reviewed at length with the patient today.  Concerns regarding medicines are outlined above. Medication changes, Labs and Tests ordered today are summarized above and listed in the Patient Instructions accessible in Encounters.   Raechel Ache PA-C  02/09/2016 11:18 AM    [address], [place], [place]  [address] Phone: [phone]; Fax: [phone]

## 2016-02-10 ENCOUNTER — Other Ambulatory Visit: Payer: Self-pay | Admitting: Physician Assistant

## 2016-02-10 ENCOUNTER — Ambulatory Visit (HOSPITAL_COMMUNITY)
Admission: RE | Admit: 2016-02-10 | Discharge: 2016-02-10 | Disposition: A | Discharge status: Home / Self Care | Payer: Medicare Other | Source: Ambulatory Visit | Attending: Physician Assistant | Admitting: Physician Assistant

## 2016-02-10 ENCOUNTER — Telehealth: Payer: Self-pay | Admitting: Physician Assistant

## 2016-02-10 ENCOUNTER — Encounter: Payer: Self-pay | Admitting: Gastroenterology

## 2016-02-10 DIAGNOSIS — J90 Pleural effusion, not elsewhere classified: Secondary | ICD-10-CM | POA: Diagnosis not present

## 2016-02-10 DIAGNOSIS — J948 Other specified pleural conditions: Secondary | ICD-10-CM | POA: Diagnosis present

## 2016-02-10 IMAGING — US US CHEST/MEDIASTINUM
1 series · 7 of 7 positions shown · non-contrast
Comparison: Two-view chest x-ray [DATE].

CLINICAL DATA: Right pleural effusion.

EXAM:
CHEST ULTRASOUND

[Series 1: us chest/mediastinum · 0.22mm/px · 7 of 7 slices shown]
[im 1/7]
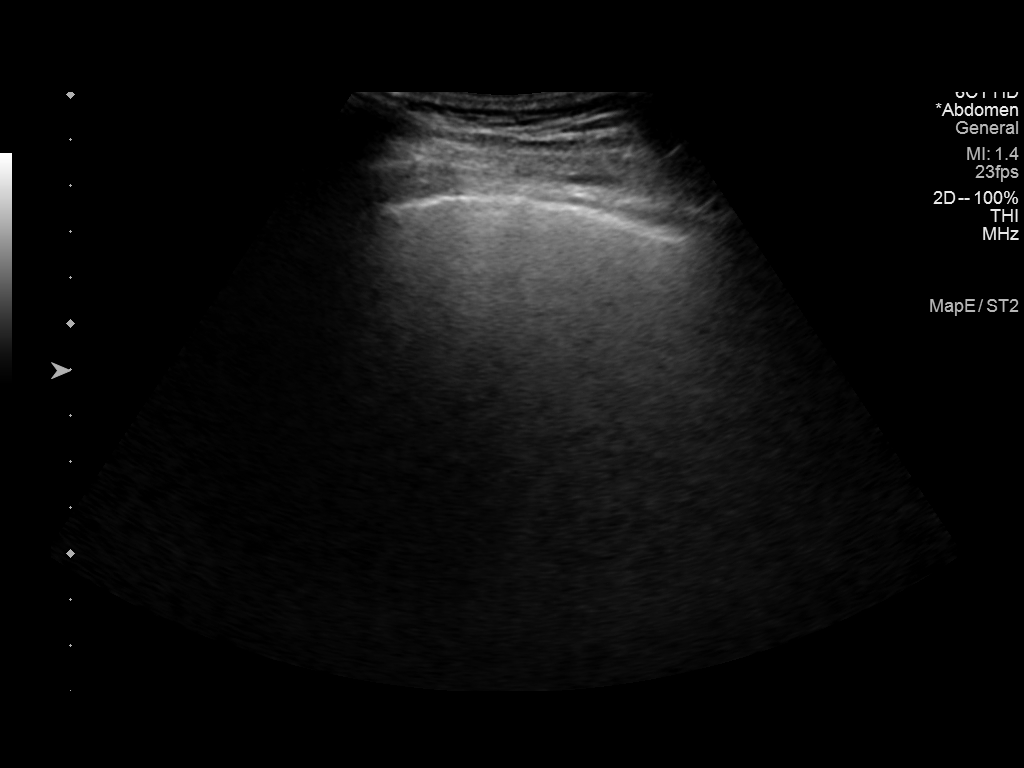
[im 2/7]
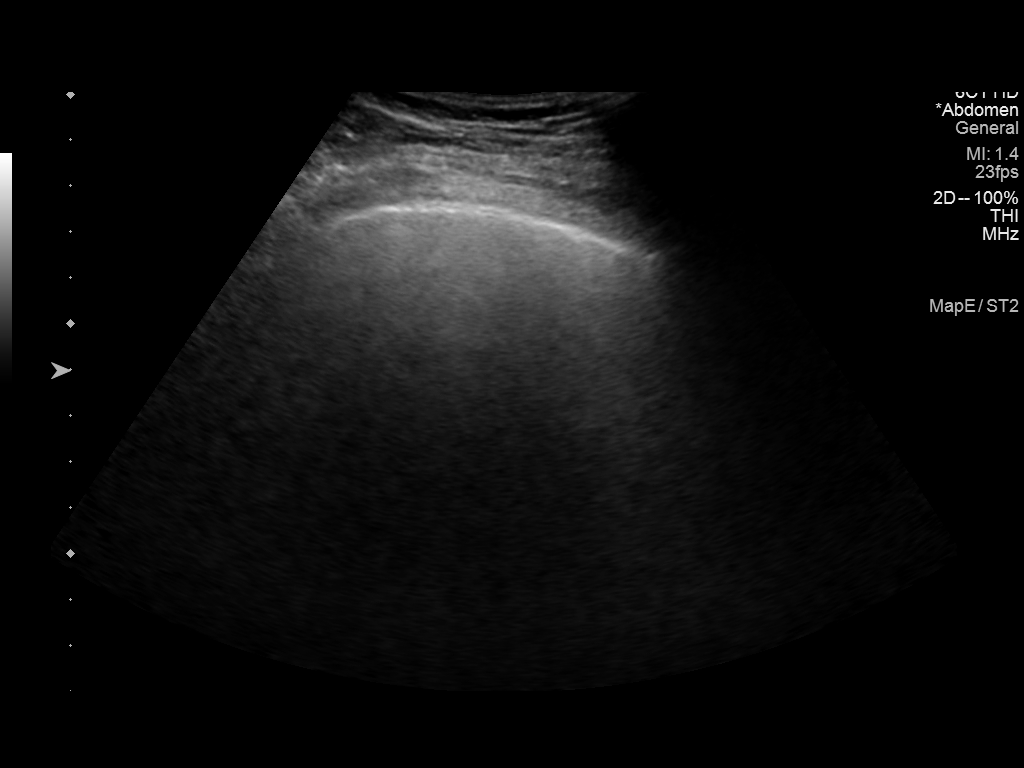
[im 3/7]
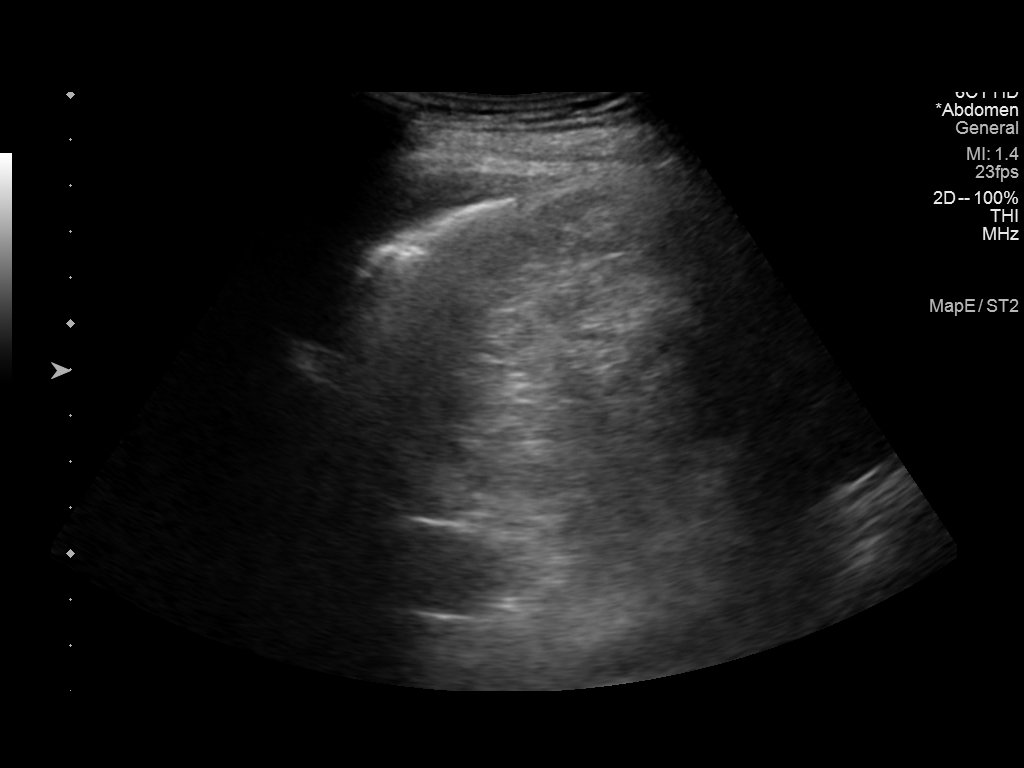
[im 4/7]
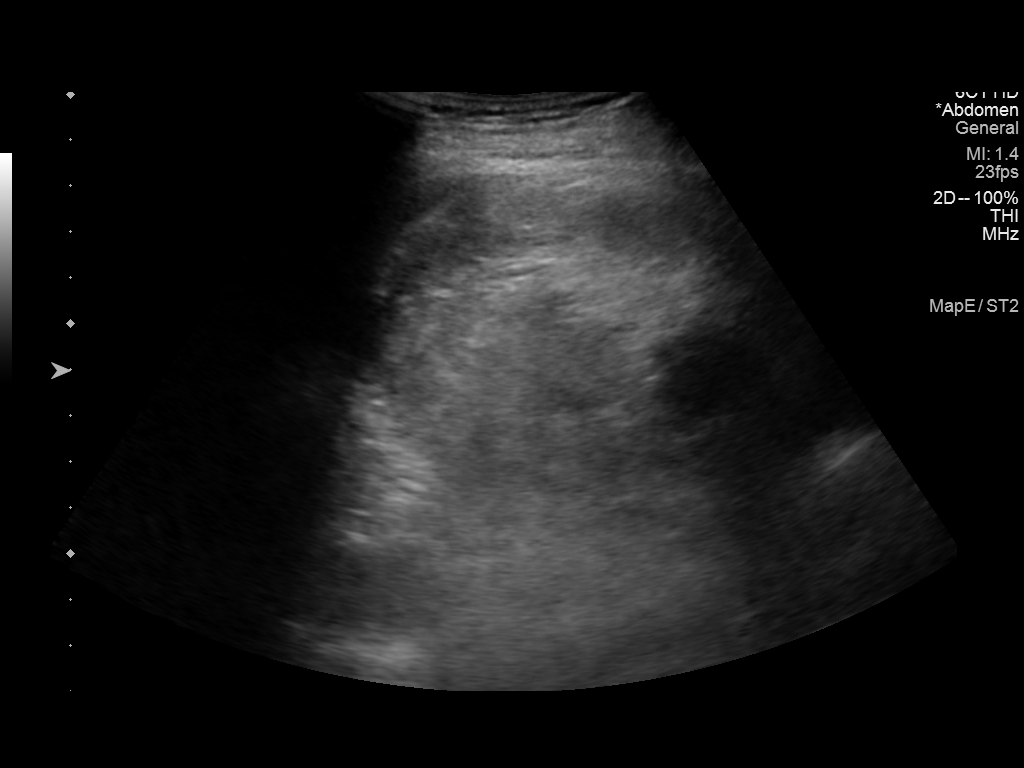
[im 5/7]
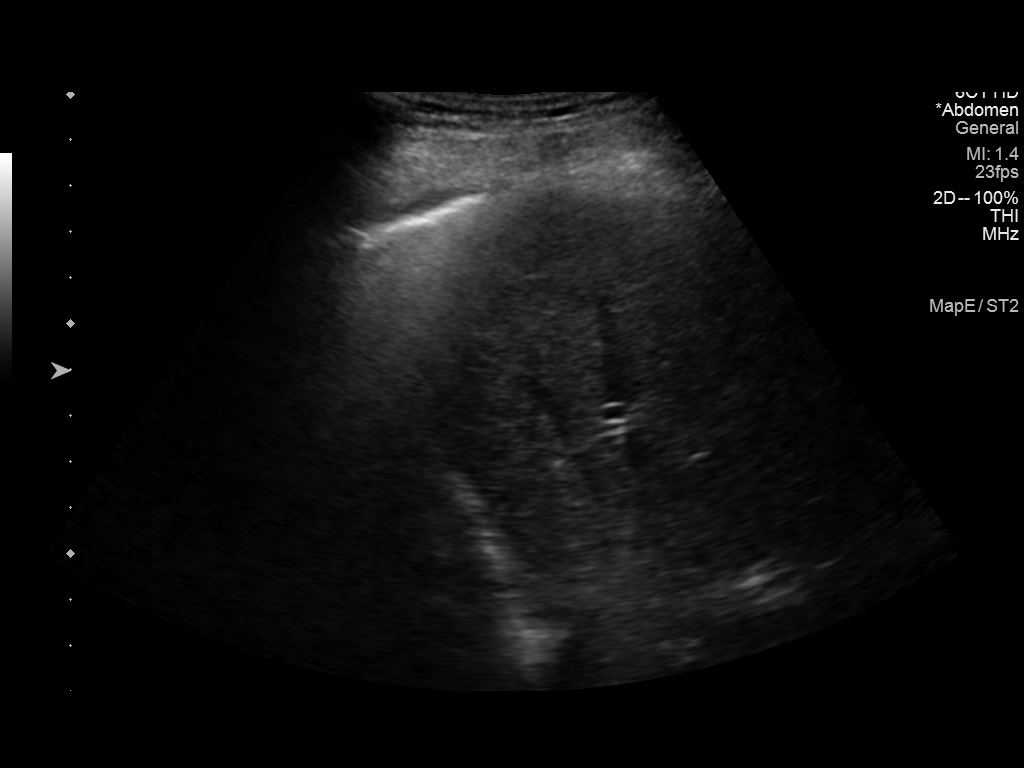
[im 6/7]
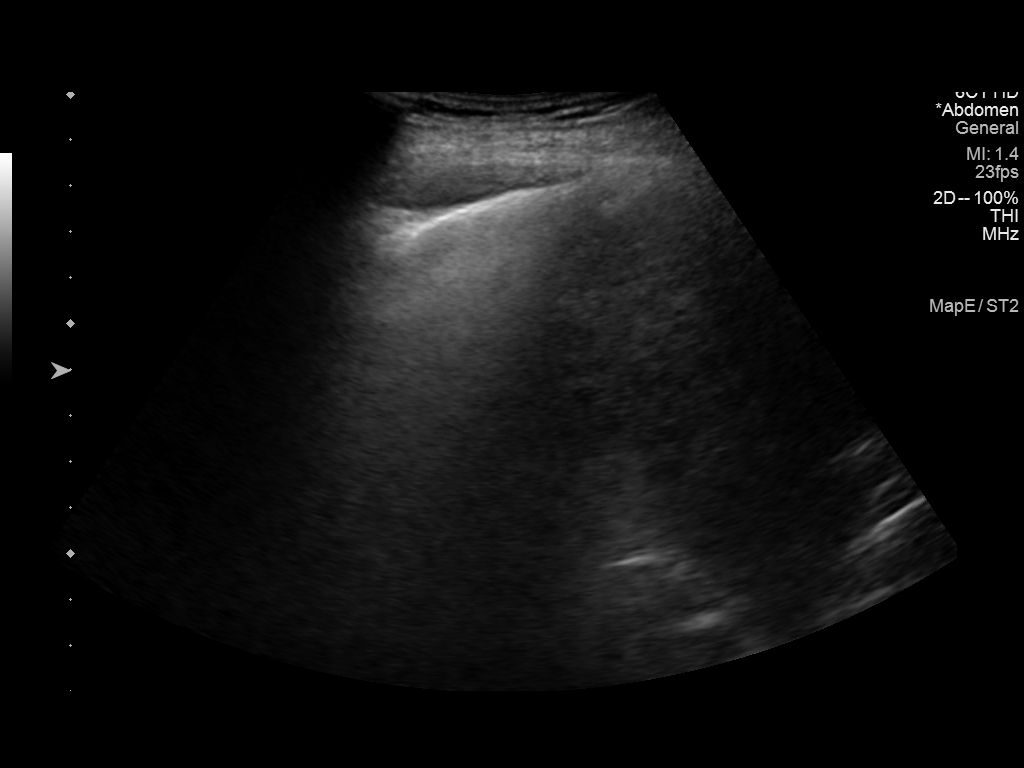
[im 7/7]
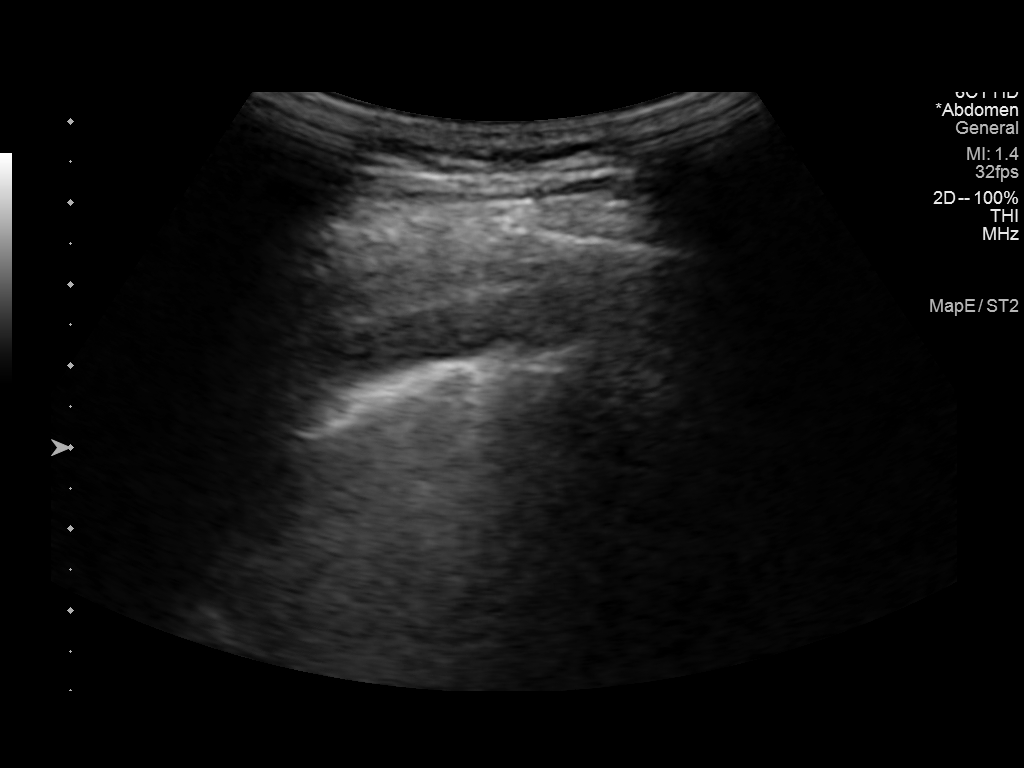

[7 of 7 positions shown; findings below may reference images not displayed]

FINDINGS: A small right pleural effusion is present. This appears to have
decreased since the prior film. The a fusion is too small for
thoracentesis.
IMPRESSION: Small right pleural effusion.

## 2016-02-10 NOTE — Telephone Encounter (Signed)
New message  Lennette Bihari from [facility] call requesting to speak to [facility] Lennette Bihari states he was unable to complete procedure on pt. Lennette Bihari says it was not enough fluid to puncture and sent pt home. Lennette Bihari states the notes are in the chart to review.  If any questions please call to discuss

## 2016-02-10 NOTE — Telephone Encounter (Signed)
Cassandra Ray, Please let Ms. Talkington know that this means that her pleural fluid collection had gotten at least somewhat better with the Lasix. However, given her continued symptoms, especially her fatigue and night sweats, I think she ought to see her primary care doctor to investigate further workup of whether there could have been something other than congestive heart failure causing the fluid collection to begin with. I had hoped there would be enough fluid to collect to test for any abnormal cells but it doesn't sound like there was enough to tap. Let them know that I will forward this message to Dr. [name] so he can review our most recent office visit upon his return next week and give [place] input on what further cardiac workup is necessary. Would continue Lasix at present dose as she is doing. Thank you.

## 2016-02-10 NOTE — Telephone Encounter (Signed)
Called pt, per Melina Copa, PA-C, and advised Mr. Cassandra Ray, DPR on file, that   Cassandra Ray, Please let Cassandra Ray know that this means that her pleural fluid collection had gotten at least somewhat better with the Lasix. However, given her continued symptoms, especially her fatigue and night sweats, I think she ought to see her primary care doctor to investigate further workup of whether there could have been something other than congestive heart failure causing the fluid collection to begin with. I had hoped there would be enough fluid to collect to test for any abnormal cells but it doesn't sound like there was enough to tap. Let them know that I will forward this message to Dr. [name] so he can review our most recent office visit upon his return next week and give [place] input on what further cardiac workup is necessary. Would continue Lasix at present dose as she is doing. Thank you  They will call Dr. Melinda Crutch, PCP, to schedule an appt

## 2016-02-10 NOTE — Progress Notes (Signed)
Patient ID: Cassandra Ray, female   DOB: 03-26-1923, 82 y.o.   MRN: [id number] Patient presented to ultrasound department today for right thoracentesis.  On limited ultrasound of posterior right chest there is insufficient fluid to safely access for thoracentesis today. Procedure was cancelled. Pt/husband/ordering clinician's office notified. No additional imaging studies performed.

## 2016-02-11 ENCOUNTER — Encounter: Payer: Self-pay | Admitting: Physician Assistant

## 2016-02-14 NOTE — Telephone Encounter (Signed)
Agree with plan Inda Mcglothen, MD  

## 2016-02-15 ENCOUNTER — Other Ambulatory Visit (HOSPITAL_COMMUNITY): Payer: Medicare Other

## 2016-02-25 ENCOUNTER — Encounter: Payer: Self-pay | Admitting: Gastroenterology

## 2016-02-25 ENCOUNTER — Telehealth: Payer: Self-pay

## 2016-02-25 ENCOUNTER — Ambulatory Visit (HOSPITAL_COMMUNITY): Payer: Medicare Other

## 2016-02-25 NOTE — Telephone Encounter (Signed)
Information sent to pt via mychart.

## 2016-02-25 NOTE — Telephone Encounter (Signed)
Here is my reply:    Yes, I understand how these episodes are distressing.  I do not think a glass of wine is causing trouble, but you are certainly welcome to avoid it for several days and see whether anything improves.   I believe the mucous is the result of, rather than the cause of, the episodes.  As such, I do not think mucinex will be helpful.    As we discussed, the trouble is that she has a large hiatal hernia causing the lower esophagus to have a "tortuous" shape. And over time, the esophagus muscles just do not contract with as much strength as they once did in order to push food down into the stomach.   So some foods have difficulty passing through that area.  I am afraid it is not amenable to endoscopic therapy, and I would not advocate surgery at her age.   I imagine that certain consistencies of food are more difficult that others.  If so, those should be consumed sparingly.  Other that that, I do not think there is anything else that can be done about it.  Food cut and chewed well, a few sips of liquid after every couple of bites.

## 2016-02-25 NOTE — Telephone Encounter (Signed)
Dr. Loletha Carrow: Cassandra Ray continues to have hiatal hernia "episodes" but only at our evening meal. We both like that one glass of wine before dinner but I am beginning to suspect that it may contribute to her problem. When she does have one of these episodes, she upchucks a good deal of mucus. I wonder if mucus is a contributing causal factor and, if so, is Musinex or something else likely to reduce the amount? These episodes are painful and embarrassing when we are eating with a group.. Will appreciate your advice. Rush Landmark Deems  This came over as a my chart message, any advise?

## 2016-03-06 ENCOUNTER — Ambulatory Visit: Payer: Medicare Other | Admitting: Physician Assistant

## 2016-03-08 NOTE — Addendum Note (Signed)
Addended by: Zebedee Iba on: 03/08/2016 09:55 AM   Modules accepted: Orders

## 2016-03-31 ENCOUNTER — Encounter: Payer: Self-pay | Admitting: Physician Assistant

## 2016-03-31 ENCOUNTER — Other Ambulatory Visit: Payer: Self-pay | Admitting: *Deleted

## 2016-03-31 ENCOUNTER — Other Ambulatory Visit: Payer: Self-pay | Admitting: Physician Assistant

## 2016-03-31 MED ORDER — FUROSEMIDE 20 MG PO TABS: 40.0000 mg | ORAL_TABLET | Freq: Every day | ORAL | 3 refills | Status: DC

## 2016-03-31 MED ORDER — FUROSEMIDE 20 MG PO TABS: ORAL_TABLET | ORAL | 1 refills | Status: DC

## 2016-05-03 ENCOUNTER — Other Ambulatory Visit: Payer: Self-pay | Admitting: Family Medicine

## 2016-05-03 DIAGNOSIS — Z1231 Encounter for screening mammogram for malignant neoplasm of breast: Secondary | ICD-10-CM

## 2016-05-12 ENCOUNTER — Telehealth: Payer: Self-pay | Admitting: Cardiology

## 2016-05-12 ENCOUNTER — Encounter: Payer: Self-pay | Admitting: Cardiology

## 2016-05-12 ENCOUNTER — Ambulatory Visit (INDEPENDENT_AMBULATORY_CARE_PROVIDER_SITE_OTHER): Payer: Medicare Other | Admitting: Cardiology

## 2016-05-12 VITALS — BP 122/72 | HR 68 | Ht 64.0 in | Wt 173.2 lb

## 2016-05-12 DIAGNOSIS — J90 Pleural effusion, not elsewhere classified: Secondary | ICD-10-CM | POA: Diagnosis not present

## 2016-05-12 DIAGNOSIS — I5032 Chronic diastolic (congestive) heart failure: Secondary | ICD-10-CM | POA: Diagnosis not present

## 2016-05-12 DIAGNOSIS — I10 Essential (primary) hypertension: Secondary | ICD-10-CM | POA: Diagnosis not present

## 2016-05-12 NOTE — Telephone Encounter (Signed)
New message  FYI, if cell phone is found, please call pt, phone belongs to husband  Call home #

## 2016-05-12 NOTE — Patient Instructions (Addendum)
Medication Instructions:  Your physician recommends that you continue on your current medications as directed. Please refer to the Current Medication list given to you today.   Labwork: None ordered   Testing/Procedures: None ordered   Follow-Up: Your physician wants you to follow-up in: 6 months with Melina Copa, PA and 12 months with Dr Dawna Part will receive a reminder letter in the mail two months in advance. If you don't receive a letter, please call our office to schedule the follow-up appointment.   Any Other Special Instructions Will Be Listed Below (If Applicable).     If you need a refill on your cardiac medications before your next appointment, please call your pharmacy.

## 2016-05-12 NOTE — Progress Notes (Signed)
[facility]. [address] [place] [address], [place]  [address] Phone: [phone] Fax:  [phone]  Date:  05/12/2016   ID:  VICTORIAN GUNN, DOB February 13, 1923, MRN [id number]  PCP:  Melinda Crutch, MD   History of Present Illness: Cassandra Ray is a 82 y.o. female with dyspnea, grade 2 diastolic dysfunction, normal ejection fraction, hypertension here for followup.   She was hospitalized on 04/25/14 with shortness of breath. She was quite hypertensive 202/106. She was given IV Lasix, Nitropaste, proBNP was elevated to 824, chest x-ray showed pulmonary edema. She also had hyponatremia. She was eventually discharged on Lasix 20 mg every other day. Echocardiogram 04/25/14 showed EF of 51%, grade 2 diastolic dysfunction.  She underwent event monitor through December/January 2014/2015 which showed no evidence of atrial fibrillation. Excellent. She's had no further palpitations.   07/22/15-knee osteoarthritis. I felt she was too high risk to undergo knee replacement surgery.no chest pain, no shortness of breath. Lives at [facility]. Sometimes has coughing when eating breakfast. Deals with mucus production. She is worried about pneumonia.  05/12/16-previous visit with Melina Copa reviewed. She is doing quite well with her weights, stable. No significant shortness of breath. She did have a moderate pleural effusion previously in August, on auscultation, her right lower lobe lung field seems improved. Continue with current Lasix dosing. No significant shortness of breath. No chest pain.  Wt Readings from Last 3 Encounters:  05/12/16 173 lb 3.2 oz (78.6 kg)  02/09/16 174 lb (78.9 kg)  02/01/16 173 lb 1.9 oz (78.5 kg)     Past Medical History:  Diagnosis Date  . Actinic keratoses   . Arthritis    back   . Atrial fibrillation ([place])    a. questionable episode in 2015 in [facility]. b. event monitor through December/January 2014/2015 which showed no evidence of atrial fibrillation.  . Breast cancer ([place])      Breast cancer (right)  . Chronic diastolic CHF (congestive heart failure) ([place])   . Compression fracture of L3 lumbar vertebra (HCC)   . GERD (gastroesophageal reflux disease)    takes otc Pepcid as needed  . Hiatal hernia 11/27/2014  . HTN (hypertension)   . Hypothyroidism   . Insomnia   . Osteopenia     Past Surgical History:  Procedure Laterality Date  . BREAST LUMPECTOMY Right 1995   lumpectomy  . COLONOSCOPY    . EYE SURGERY Bilateral    cataract w/ lens implant  . KYPHOPLASTY N/A 09/18/2013   Procedure: KYPHOPLASTY;  Surgeon: Sinclair Ship, MD;  Location: [facility];  Service: Orthopedics;  Laterality: N/A;  Lumbar 3 kyphoplasty  . TONSILLECTOMY      Current Outpatient Prescriptions  Medication Sig Dispense Refill  . b complex vitamins tablet Take 1 tablet by mouth daily.    . Calcium Citrate-Vitamin D (CITRACAL + D PO) Take 1 tablet by mouth daily. 630 mg/ 400mg     . cholecalciferol (VITAMIN D) 400 UNITS TABS tablet Take 1,200 Units by mouth daily.     . DULoxetine (CYMBALTA) 30 MG capsule Take 30 mg by mouth daily.     . furosemide (LASIX) 20 MG tablet Take 2 tablets (40 mg total) by mouth daily. 60 tablet 3  . Hyoscyamine Sulfate 0.375 MG TBCR Take by mouth 2 (two) times daily.    Marland Kitchen losartan (COZAAR) 50 MG tablet Take 50 mg by mouth daily.    . Magnesium 250 MG TABS Take 250 mg by mouth  daily.    . Melatonin 5 MG TABS Take 1 tablet by mouth at bedtime as needed (sleep).    . metoprolol (LOPRESSOR) 100 MG tablet Take 100 mg by mouth 2 (two) times daily.    . Multiple Vitamin (MULTIVITAMIN) tablet Take 1 tablet by mouth daily.    . pantoprazole (PROTONIX) 40 MG tablet Take 40 mg by mouth daily.    . SYMBICORT 80-4.5 MCG/ACT inhaler Take 2 puffs by mouth 2 (two) times daily.    Marland Kitchen SYNTHROID 137 MCG tablet Take 1 tablet by mouth daily.     No current facility-administered medications for this visit.     Allergies:   No Known Allergies  Social History:  The  patient  reports that she has never smoked. She has never used smokeless tobacco. She reports that she drinks about 4.2 oz of alcohol per week . She reports that she does not use drugs.   ROS:  Please see the history of present illness.   No syncope, no fevers. Positive back pain. No chest pain.    PHYSICAL EXAM: VS:  BP 122/72   Pulse 68   Ht 5\' 4"  (1.626 m)   Wt 173 lb 3.2 oz (78.6 kg)   LMP  (LMP Unknown)   BMI 29.73 kg/m  Well nourished, well developed, in no acute distress HEENT: normal Neck: no JVD Cardiac:  normal S1, S2; RRR; no murmur Lungs:  clear to auscultation bilaterally, no wheezing, rhonchi or ralesNo significant decreased breath sounds at bases on right. Abd: soft, nontender, no hepatomegaly Ext: trace edema Skin: warm and dry Neuro: no focal abnormalities noted  EKG:  EKG was ordered today. 07/22/15 sinus rhythm with first-degree AV block heart rate 66 bpm, PR interval 226 ms.-Prior EKGs from hospitalization reviewed. P waves are present however challenging to make a definitive diagnosis of atrial fibrillation. Intervals appear quite normal. Could be PAT?    Echo: 02/02/16 - Left ventricle: The cavity size was normal. Wall thickness was   increased in a pattern of moderate LVH. There was focal basal   hypertrophy. Systolic function was normal. The estimated ejection   fraction was in the range of 60% to 65%. Wall motion was normal;   there were no regional wall motion abnormalities. Features are   consistent with a pseudonormal left ventricular filling pattern,   with concomitant abnormal relaxation and increased filling   pressure (grade 2 diastolic dysfunction).  ASSESSMENT AND PLAN:  1. Diastolic heart failure-grade 2 diastolic dysfunction, normal ejection fraction. Responded previously to IV diuresis. Hyponatremia appears to be chronic. Lasix 40 mg daily. She understands that if her weight gains by 3-5 pounds she may take an extra Lasix dose. Overall she is  doing well currently. She showed me her weights, they are stable. 2. Possible atrial fibrillation paroxysmal- Event monitor was reassuring. No evidence of atrial fibrillation over the month of December/January 2015. She states that she did not realize that she was in atrial fibrillation when she went to the emergency room. They did not start her on anticoagulation in the hospital which may lead me to believe that there could of been question in the diagnosis of atrial fibrillation as well or perhaps it was not started because of short episode. Stress test, echocardiogram reassuring. Excellent. In fact, she is interested in coming off of her aspirin. Since we would be utilizing the aspirin purely for primary prevention, given her advanced age, greater than 59, her risk of bleeding from aspirin likely  outweighs the risk. There is no current recommendation from guidelines internal medicine she would like to come off of this. 3. Hypertension-well controlled. Agree with metoprolol as well as angiotensin receptor blocker given her current condition. 4. Pleural effusion-right sided, likely related to diastolic heart failure. This has improved. Breathing is better. No need for further studies. 5. Dayna in 6 months, me in one year.  Signed, Candee Furbish, MD [facility]  05/12/2016 2:10 PM

## 2016-05-12 NOTE — Telephone Encounter (Signed)
Noted - checked room pt was in and it's not there.  Checked waiting area and front desk and it has not been located.

## 2016-05-25 ENCOUNTER — Encounter: Payer: Self-pay | Admitting: Physician Assistant

## 2016-05-31 ENCOUNTER — Ambulatory Visit
Admission: RE | Admit: 2016-05-31 | Discharge: 2016-05-31 | Disposition: A | Discharge status: Home / Self Care | Payer: Medicare Other | Source: Ambulatory Visit | Attending: Family Medicine | Admitting: Family Medicine

## 2016-05-31 ENCOUNTER — Other Ambulatory Visit (INDEPENDENT_AMBULATORY_CARE_PROVIDER_SITE_OTHER): Payer: Medicare Other

## 2016-05-31 ENCOUNTER — Encounter: Payer: Self-pay | Admitting: Physician Assistant

## 2016-05-31 ENCOUNTER — Ambulatory Visit (INDEPENDENT_AMBULATORY_CARE_PROVIDER_SITE_OTHER): Payer: Medicare Other | Admitting: Physician Assistant

## 2016-05-31 VITALS — BP 102/76 | HR 80 | Ht 64.0 in | Wt 172.4 lb

## 2016-05-31 DIAGNOSIS — R197 Diarrhea, unspecified: Secondary | ICD-10-CM

## 2016-05-31 DIAGNOSIS — R109 Unspecified abdominal pain: Secondary | ICD-10-CM

## 2016-05-31 DIAGNOSIS — Z1231 Encounter for screening mammogram for malignant neoplasm of breast: Secondary | ICD-10-CM

## 2016-05-31 LAB — COMPREHENSIVE METABOLIC PANEL
ALBUMIN: 3.9 g/dL (ref 3.5–5.2)
ALT: 14 U/L (ref 0–35)
AST: 24 U/L (ref 0–37)
Alkaline Phosphatase: 99 U/L (ref 39–117)
BUN: 21 mg/dL (ref 6–23)
CHLORIDE: 102 meq/L (ref 96–112)
CO2: 32 mEq/L (ref 19–32)
Calcium: 9.5 mg/dL (ref 8.4–10.5)
Creatinine, Ser: 1.1 mg/dL (ref 0.40–1.20)
GFR: 49.47 mL/min — AB (ref >60.00)
GLUCOSE: 76 mg/dL (ref 70–99)
POTASSIUM: 4.9 meq/L (ref 3.5–5.1)
SODIUM: 140 meq/L (ref 135–145)
Total Bilirubin: 0.6 mg/dL (ref 0.2–1.2)
Total Protein: 7.1 g/dL (ref 6.0–8.3)

## 2016-05-31 LAB — CBC WITH DIFFERENTIAL/PLATELET
BASOS PCT: 0.4 % (ref 0.0–3.0)
Basophils Absolute: 0 10*3/uL (ref 0.0–0.1)
EOS PCT: 2.9 % (ref 0.0–5.0)
Eosinophils Absolute: 0.2 10*3/uL (ref 0.0–0.7)
HCT: 38.2 % (ref 36.0–46.0)
Hemoglobin: 13.2 g/dL (ref 12.0–15.0)
LYMPHS ABS: 1.5 10*3/uL (ref 0.7–4.0)
Lymphocytes Relative: 18.3 % (ref 12.0–46.0)
MCHC: 34.5 g/dL (ref 30.0–36.0)
MCV: 94 fl (ref 78.0–100.0)
MONO ABS: 1 10*3/uL (ref 0.1–1.0)
Monocytes Relative: 12.2 % — ABNORMAL HIGH (ref 3.0–12.0)
NEUTROS PCT: 66.2 % (ref 43.0–77.0)
Neutro Abs: 5.4 10*3/uL (ref 1.4–7.7)
Platelets: 180 10*3/uL (ref 150.0–400.0)
RBC: 4.06 Mil/uL (ref 3.87–5.11)
RDW: 13.3 % (ref 11.5–15.5)
WBC: 8.2 10*3/uL (ref 4.0–10.5)

## 2016-05-31 LAB — C-REACTIVE PROTEIN: CRP: 0.5 mg/dL (ref 0.5–20.0)

## 2016-05-31 LAB — SEDIMENTATION RATE: Sed Rate: 18 mm/hr (ref 0–30)

## 2016-05-31 MED ORDER — DIPHENOXYLATE-ATROPINE 2.5-0.025 MG PO TABS: 2.0000 | ORAL_TABLET | Freq: Two times a day (BID) | ORAL | 1 refills | Status: DC | PRN

## 2016-05-31 NOTE — Patient Instructions (Signed)
Your physician has requested that you go to the basement for lab work before leaving today.  We have sent the following medications to your pharmacy for you to pick up at your convenience: Lomotil

## 2016-05-31 NOTE — Progress Notes (Addendum)
Subjective:    Patient ID: Cassandra Ray, female    DOB: 20-Nov-1922, 82 y.o.   MRN: [id number]  HPI Cassandra Ray is a pleasant 82 year old white female known recently to Dr. Loletha Carrow. She was initially seen by myself in June 2017 with at that time complaints of diarrhea 3 weeks. She had had a stool culture done by her PCP, we ordered a stool for C. difficile which was unable to be completed by the lab and she was given an empiric course of metronidazole and floor store. Her symptoms completely resolved by the time she was seen in follow-up by Dr. Loletha Carrow She comes back in today with her family, and the all seem frustrated because she's been having problems with diarrhea over the past 4 months. She says she finally went to an [facility] on December 16 because she was having abdominal cramping. She was given a prescription for Lomotil area and she's been taking Lomotil twice a day and says that this does seem to help somewhat with the cramping and has slow down the diarrhea somewhat. Even with the Lomotil twice a day she says she's having 4-5 loose to liquid bowel movements per day and most nights has to get up at least once with diarrhea as well. She noted any melena or hematochezia. Her appetite has been fine she's not had any fever or chills. Weight is actually up 18 pounds since her last office visit. She does not believe she's had any other courses of antibiotics since she was last here. She started on any new medications. Her daughter mentions they've also been concerned about her hiatal hernia. She does take Protonix 40 mg once daily. Apparently she has very infrequent episodes of a vague dysphagia when she feels like her throat is "closing". She did have barium swallow done in June 2016 which showed an 8 cm hiatal hernia no stricture was noted, she does have a traction diverticulum just below the carina, there is no evidence of stricture or significant dysmotility.  Review of Systems Pertinent  positive and negative review of systems were noted in the above HPI section.  All other review of systems was otherwise negative.  Outpatient Encounter Prescriptions as of 05/31/2016  Medication Sig  . b complex vitamins tablet Take 1 tablet by mouth daily.  . Cholecalciferol (VITAMIN D3) 2000 units TABS Take by mouth daily at 2 PM.  . diphenoxylate-atropine (LOMOTIL) 2.5-0.025 MG tablet Take 2 tablets by mouth 2 (two) times daily as needed for diarrhea or loose stools. Up to 3 times daily  . DULoxetine (CYMBALTA) 30 MG capsule Take 30 mg by mouth daily.   . furosemide (LASIX) 20 MG tablet Take 2 tablets (40 mg total) by mouth daily.  Marland Kitchen losartan (COZAAR) 50 MG tablet Take 50 mg by mouth daily.  . Magnesium 250 MG TABS Take 250 mg by mouth daily.  . Melatonin 5 MG TABS Take 1 tablet by mouth at bedtime as needed (sleep).  . metoprolol (LOPRESSOR) 100 MG tablet Take 100 mg by mouth 2 (two) times daily.  . Multiple Vitamin (MULTIVITAMIN) tablet Take 1 tablet by mouth daily.  . pantoprazole (PROTONIX) 40 MG tablet Take 40 mg by mouth daily.  . Probiotic Product (ALIGN PO) Take by mouth daily at 2 PM.  . SYMBICORT 80-4.5 MCG/ACT inhaler Take 2 puffs by mouth 2 (two) times daily.  Marland Kitchen SYNTHROID 137 MCG tablet Take 1 tablet by mouth daily.  . [DISCONTINUED] diphenoxylate-atropine (LOMOTIL) 2.5-0.025 MG tablet 4 (four)  times daily.  Marland Kitchen Hyoscyamine Sulfate 0.375 MG TBCR Take by mouth 2 (two) times daily.  . [DISCONTINUED] Calcium Citrate-Vitamin D (CITRACAL + D PO) Take 1 tablet by mouth daily. 630 mg/ 400mg   . [DISCONTINUED] cholecalciferol (VITAMIN D) 400 UNITS TABS tablet Take 1,200 Units by mouth daily.    No facility-administered encounter medications on file as of 05/31/2016.    No Known Allergies Patient Active Problem List   Diagnosis Date Noted  . Pleural effusion, right 02/08/2016  . Acute on chronic diastolic CHF (congestive heart failure) ([place]) 02/08/2016  . Lower extremity edema  01/31/2016  . Chronic diastolic heart failure ([place]) 02/03/2015  . Hyponatremia 04/25/2014  . SOB (shortness of breath)   . Hypertensive urgency 04/24/2014  . Compression fracture 09/18/2013  . Knee pain 08/11/2013  . A-fib ([place]) 05/09/2013  . Back pain 05/09/2013  . HTN (hypertension)   . Hypothyroidism    Social History   Social History  . Marital status: Married    Spouse name: N/A  . Number of children: N/A  . Years of education: N/A   Occupational History  . Not on file.   Social History Main Topics  . Smoking status: Never Smoker  . Smokeless tobacco: Never Used  . Alcohol use 4.2 oz/week    7 Glasses of wine per week     Comment: 1 glass of wine a day  . Drug use: No  . Sexual activity: Not on file   Other Topics Concern  . Not on file   Social History Narrative  . No narrative on file    Cassandra Ray's family history includes CAD in her mother; Diabetes Mellitus II in her mother; Heart disease in her sister; Heart failure in her father and mother; Kidney cancer in her brother; Prostate cancer in her brother.      Objective:    Vitals:   05/31/16 1056  BP: 102/76  Pulse: 80    Physical Exam  well-developed elderly white female in no acute distress, pleasant accompanied by her husband and daughter blood pressure 102/76 pulse 80, height 5 foot 4 weight 172 BMI of 29.5. HEENT; nontraumatic normocephalic EOMI PERRLA sclera anicteric, Cardiovascular; regular rate and rhythm with S1-S2 no murmur or gallop, Pulmonary; clear bilaterally, Abdomen; soft is no focal tenderness is no guarding or rebound no palpable mass or hepatosplenomegaly bowel sounds are present, Rectal ;exam not done, Extremities; no clubbing cyanosis or edema skin warm and dry, Neuropsych; mood and affect appropriate       Assessment & Plan:   #3 82 year old female with a 4 month history of persistent diarrhea with at least 4-5 bowel movements per day and frequent nocturnal episodes of  diarrhea. Patient had responded previously to an empiric course of metronidazole, but symptoms apparently had recurred a couple of months later. Etiology of her persistent diarrhea is not clear on the previously thought to be infectious. We will rule out infectious, versus inflammatory, consider microscopic colitis. Fortunately she has not had any associated weight loss or other signs of failure to thrive.  #2 history of congestive heart failure/normal EF #3 history of atrial fibrillation #4 hypertension #5 diverticulosis-last colonoscopy February 2006 #6 GERD  Plan; GI pathogen panel, stool for C. difficile by PCR, fecal elastase, stool for lactoferrin CBC with differential, sedimentation rate and CRP Have refilled Lomotil-will continue twice daily dosing currently Continue probiotic Will await labs and stool studies-if all negative may give another empiric course of metronidazole Patient may need colonoscopy  with random biopsies. This was discussed at the visit today. She is very reluctant due to her age but agreeable if absolutely necessary.   Cassandra Ray Genia Harold PA-C 05/31/2016   Cc: Lawerance Cruel, MD   Thank you for sending this case to me. I have reviewed the entire note, and the outlined plan seems appropriate.  I am familiar with this patient. She has a large hiatal hernia and dysmotility, not amenable to medical therapy and certainly not surgery at her age. Age-related fecal incontinence is almost certainly contributing as well.  I think she is too high risk to undergo a colonoscopy.  In case she has microscopic colitis, a trial of pepto-bismol would be reasonable, as it has been studied in microscopic colitis and is also a good general treatment for diarrhea.  As long as the patient and husband are aware that it will cause black stool.

## 2016-06-01 ENCOUNTER — Other Ambulatory Visit: Payer: Self-pay | Admitting: Physician Assistant

## 2016-06-01 ENCOUNTER — Other Ambulatory Visit: Payer: Medicare Other

## 2016-06-01 DIAGNOSIS — R109 Unspecified abdominal pain: Secondary | ICD-10-CM

## 2016-06-01 DIAGNOSIS — R197 Diarrhea, unspecified: Secondary | ICD-10-CM

## 2016-06-02 LAB — C. DIFFICILE GDH AND TOXIN A/B
C. DIFFICILE GDH: NOT DETECTED
C. difficile Toxin A/B: NOT DETECTED

## 2016-06-02 LAB — FECAL LACTOFERRIN, QUANT: Lactoferrin: POSITIVE

## 2016-06-04 ENCOUNTER — Encounter: Payer: Self-pay | Admitting: Physician Assistant

## 2016-06-06 LAB — GASTROINTESTINAL PATHOGEN PANEL PCR
C. DIFFICILE TOX A/B, PCR: NOT DETECTED
CAMPYLOBACTER, PCR: NOT DETECTED
CRYPTOSPORIDIUM, PCR: NOT DETECTED
E COLI 0157, PCR: NOT DETECTED
E coli (ETEC) LT/ST PCR: NOT DETECTED
E coli (STEC) stx1/stx2, PCR: NOT DETECTED
GIARDIA LAMBLIA, PCR: NOT DETECTED
Norovirus, PCR: NOT DETECTED
ROTAVIRUS, PCR: NOT DETECTED
SALMONELLA, PCR: NOT DETECTED
Shigella, PCR: NOT DETECTED

## 2016-06-07 ENCOUNTER — Encounter: Payer: Self-pay | Admitting: Physician Assistant

## 2016-06-07 ENCOUNTER — Other Ambulatory Visit: Payer: Self-pay

## 2016-06-07 MED ORDER — METRONIDAZOLE 250 MG PO TABS: 250.0000 mg | ORAL_TABLET | Freq: Four times a day (QID) | ORAL | 0 refills | Status: AC

## 2016-06-08 ENCOUNTER — Encounter: Payer: Self-pay | Admitting: Physician Assistant

## 2016-06-08 LAB — PANCREATIC ELASTASE, FECAL: PANCREATIC ELASTASE-1, STL: 151 ug/g — AB

## 2016-06-13 ENCOUNTER — Encounter: Payer: Self-pay | Admitting: Physician Assistant

## 2016-06-14 ENCOUNTER — Encounter: Payer: Self-pay | Admitting: Physician Assistant

## 2016-06-16 ENCOUNTER — Telehealth: Payer: Self-pay

## 2016-06-17 ENCOUNTER — Encounter: Payer: Self-pay | Admitting: Physician Assistant

## 2016-06-23 ENCOUNTER — Encounter: Payer: Self-pay | Admitting: Physician Assistant

## 2016-06-26 ENCOUNTER — Encounter: Payer: Self-pay | Admitting: Physician Assistant

## 2016-06-26 ENCOUNTER — Telehealth: Payer: Self-pay

## 2016-06-26 MED ORDER — PANCRELIPASE (LIP-PROT-AMYL) 40000-136000 UNITS PO CPEP: ORAL_CAPSULE | ORAL | 0 refills | Status: DC

## 2016-06-26 NOTE — Telephone Encounter (Signed)
Left message that I had sent an rx for Zenpep to [facility] using a voucher - patient will get 200 capsules free.  They will be ready for pick up tomorrow.

## 2016-06-26 NOTE — Telephone Encounter (Signed)
200 capsules of Zenpep sent to [facility] - they will be free.

## 2016-07-13 ENCOUNTER — Encounter: Payer: Self-pay | Admitting: Physician Assistant

## 2016-07-13 ENCOUNTER — Ambulatory Visit (INDEPENDENT_AMBULATORY_CARE_PROVIDER_SITE_OTHER): Payer: Medicare Other | Admitting: Physician Assistant

## 2016-07-13 VITALS — BP 104/60 | HR 80 | Ht 64.0 in | Wt 171.8 lb

## 2016-07-13 DIAGNOSIS — K8689 Other specified diseases of pancreas: Secondary | ICD-10-CM

## 2016-07-13 DIAGNOSIS — K529 Noninfective gastroenteritis and colitis, unspecified: Secondary | ICD-10-CM

## 2016-07-13 MED ORDER — PANCRELIPASE (LIP-PROT-AMYL) 40000-136000 UNITS PO CPEP: ORAL_CAPSULE | ORAL | 6 refills | Status: DC

## 2016-07-13 NOTE — Patient Instructions (Signed)
You may stop Align.  Stop Hyoscyamine.  Take Lomotil as needed.   We sent refills for Zenpep as needed, take 1 tab with each meal.   Follow up in one year or sooner if needed.

## 2016-07-13 NOTE — Progress Notes (Addendum)
Subjective:    Patient ID: Cassandra Ray, female    DOB: 07-Jul-1922, 83 y.o.   MRN: [id number]  HPI Cassandra Ray is a very nice 83 year old, known to myself and now established with Dr. Rachael Darby. She has history of hypertension, atrial fibrillation, congestive heart failure, diverticular disease, and a large hiatal hernia with chronic GERD. She had been struggling with persistent diarrhea over the past several months. Been treated with a course of metronidazole in the summer of 2017 which seemed to help short term but then her diarrhea recurred and had been persistent over the past 4-5 months with 3-4 loose stools per day. She was last seen in the office in December 2017 and at that time stool studies were done including fecal elastase. Stool studies were all negative, fecal elastase was decreased at 151 consistent with moderate pancreatic insufficiency. She has been started on Zenpep 40,001 by mouth before each meal and has had a very good response. Over the past several weeks her stools have become formed and she is now having one to 2 formed stools per day. He is feeling better in general, back to exercising etc.  Review of Systems Pertinent positive and negative review of systems were noted in the above HPI section.  All other review of systems was otherwise negative.  Outpatient Encounter Prescriptions as of 07/13/2016  Medication Sig  . b complex vitamins tablet Take 1 tablet by mouth daily.  . Cholecalciferol (VITAMIN D3) 2000 units TABS Take by mouth daily at 2 PM.  . diphenoxylate-atropine (LOMOTIL) 2.5-0.025 MG tablet Take 2 tablets by mouth 2 (two) times daily as needed for diarrhea or loose stools. Up to 3 times daily  . DULoxetine (CYMBALTA) 30 MG capsule Take 30 mg by mouth daily.   . furosemide (LASIX) 20 MG tablet Take 2 tablets (40 mg total) by mouth daily.  . Glucosamine-Chondroit-Vit C-Mn (GLUCOSAMINE 1500 COMPLEX PO) Take 1 capsule by mouth daily.  Marland Kitchen Hyoscyamine Sulfate 0.375 MG  TBCR Take by mouth 2 (two) times daily.  Marland Kitchen losartan (COZAAR) 50 MG tablet Take 50 mg by mouth daily.  . Magnesium 250 MG TABS Take 250 mg by mouth daily.  . Melatonin 5 MG TABS Take 1 tablet by mouth at bedtime as needed (sleep).  . metoprolol (LOPRESSOR) 100 MG tablet Take 100 mg by mouth 2 (two) times daily.  . Multiple Vitamin (MULTIVITAMIN) tablet Take 1 tablet by mouth daily.  . Pancrelipase, Lip-Prot-Amyl, (ZENPEP) 40000-136000 units CPEP Take 1 capsule with each meal .  . pantoprazole (PROTONIX) 40 MG tablet Take 40 mg by mouth daily.  . Probiotic Product (ALIGN PO) Take by mouth daily at 2 PM.  . SYMBICORT 80-4.5 MCG/ACT inhaler Take 2 puffs by mouth 2 (two) times daily.  Marland Kitchen SYNTHROID 137 MCG tablet Take 1 tablet by mouth daily.  . vitamin C (ASCORBIC ACID) 500 MG tablet Take 500 mg by mouth daily.  . [DISCONTINUED] Pancrelipase, Lip-Prot-Amyl, (ZENPEP) 40000-136000 units CPEP Take 1 capsule with each meal and a snack (4 total daily)   No facility-administered encounter medications on file as of 07/13/2016.    No Known Allergies Patient Active Problem List   Diagnosis Date Noted  . Pleural effusion, right 02/08/2016  . Acute on chronic diastolic CHF (congestive heart failure) ([place]) 02/08/2016  . Lower extremity edema 01/31/2016  . Chronic diastolic heart failure ([place]) 02/03/2015  . Hyponatremia 04/25/2014  . SOB (shortness of breath)   . Hypertensive urgency 04/24/2014  . Compression fracture 09/18/2013  .  Knee pain 08/11/2013  . A-fib ([place]) 05/09/2013  . Back pain 05/09/2013  . HTN (hypertension)   . Hypothyroidism    Social History   Social History  . Marital status: Married    Spouse name: N/A  . Number of children: N/A  . Years of education: N/A   Occupational History  . Not on file.   Social History Main Topics  . Smoking status: Former Research scientist (life sciences)  . Smokeless tobacco: Never Used     Comment: only in college  . Alcohol use 4.2 oz/week    7 Glasses of wine per  week     Comment: 1 glass of wine a day  . Drug use: No  . Sexual activity: Not on file   Other Topics Concern  . Not on file   Social History Narrative  . No narrative on file    Ms. Seivert's family history includes CAD in her mother; Diabetes Mellitus II in her mother; Heart disease in her sister; Heart failure in her father and mother; Kidney cancer in her brother; Prostate cancer in her brother.      Objective:    Vitals:   07/13/16 1331  BP: 104/60  Pulse: 80    Physical Exam  well-developed elderly white female in no acute distress, accompanied by her husband both very pleasant blood pressure 104/60 pulse 80 01/03/1970 BMI 29.4. HEENT; nontraumatic normocephalic EOMI PERRLA sclera anicteric, Cardiovascular; regular rate and rhythm with S1-S2 no murmur rub or gallop, Pulmonary ;clear bilaterally abdomen soft nontender nondistended bowel sounds are active no palpable mass or hepatosplenomegaly, Extremities ;no clubbing cyanosis or edema skin warm and dry, Neuropsych ;mood and affect appropriate       Assessment & Plan:   #73 83 year old white female with chronic diarrhea, consistent with mild pancreatic insufficiency. Patient doing well with pancreatic enzyme supplementation and diarrhea has resolved.  Plan; Continue Zenpep- 40,000 units prior to each meal 3 times a day. Prescription has been sent She will follow-up with Dr. Loletha Carrow or myself on an as-needed basis.  [name] Genia Harold PA-C 07/13/2016   Cc: Lawerance Cruel, MD   Thank you for sending this case to me. I have reviewed the entire note, and the outlined plan seems appropriate.   Wilfrid Lund, MD

## 2016-07-26 ENCOUNTER — Other Ambulatory Visit: Payer: Self-pay | Admitting: Physician Assistant

## 2016-07-27 ENCOUNTER — Encounter: Payer: Self-pay | Admitting: Physician Assistant

## 2016-07-27 ENCOUNTER — Encounter: Payer: Self-pay | Admitting: Cardiology

## 2016-07-28 ENCOUNTER — Encounter: Payer: Self-pay | Admitting: Physician Assistant

## 2016-07-29 ENCOUNTER — Encounter: Payer: Self-pay | Admitting: Gastroenterology

## 2016-08-03 ENCOUNTER — Ambulatory Visit (INDEPENDENT_AMBULATORY_CARE_PROVIDER_SITE_OTHER): Payer: Medicare Other | Admitting: Gastroenterology

## 2016-08-03 ENCOUNTER — Encounter: Payer: Self-pay | Admitting: Gastroenterology

## 2016-08-03 VITALS — BP 108/70 | HR 76 | Ht 63.0 in | Wt 170.0 lb

## 2016-08-03 DIAGNOSIS — K909 Intestinal malabsorption, unspecified: Secondary | ICD-10-CM

## 2016-08-03 DIAGNOSIS — K8689 Other specified diseases of pancreas: Secondary | ICD-10-CM

## 2016-08-03 DIAGNOSIS — R197 Diarrhea, unspecified: Secondary | ICD-10-CM | POA: Diagnosis not present

## 2016-08-03 MED ORDER — PANCRELIPASE (LIP-PROT-AMYL) 40000-136000 UNITS PO CPEP: ORAL_CAPSULE | ORAL | 6 refills | Status: DC

## 2016-08-03 NOTE — Progress Notes (Signed)
[facility] Progress Note  Chief Complaint: Diarrhea  Subjective  History:  Cassandra Ray is here today with her husband and daughter to discuss her ongoing diarrhea. She has been seen by our PA [name] 3 times over the last 6-9 months. Eventually a fecal elastase was found to be low and the patient was put on pancreatic enzyme supplements. She takes one capsule a day, and this seems to be helping quite a lot. However, the last 2 weeks she apparently has more frequent episodes of loose stool. I'm afraid she is a very poor historian. She has apparently had a good appetite and no noticeable weight loss. There is been no rectal bleeding. She also no longer takes Imodium for unclear reasons. It seems that she just forgot that it was available.  ROS: Cardiovascular:  no chest pain Respiratory: no dyspnea  The patient's Past Medical, Family and Social History were reviewed and are on file in the EMR.  Objective:  Med list reviewed  Vital signs in last 24 hrs: Vitals:   08/03/16 1038  BP: 108/70  Pulse: 76    Physical Exam    HEENT: sclera anicteric, oral mucosa moist without lesions  Neck: supple, no thyromegaly, JVD or lymphadenopathy  Cardiac: RRR without murmurs, S1S2 heard, no peripheral edema  Pulm: clear to auscultation bilaterally, normal RR and effort noted  Abdomen: soft, No tenderness, with active bowel sounds. No guarding or palpable hepatosplenomegaly.  Skin; warm and dry, no jaundice or rash   @ASSESSMENTPLANBEGIN @ Assessment: Encounter Diagnoses  Name Primary?  . Diarrhea due to malabsorption Yes  . Pancreatic insufficiency    This is probably multifactorial diarrhea. There is some apparent age-related loss of pancreatic function with malabsorption, but she never really lost weight with this ongoing diarrhea. It may be medicine side effects such as the PPI and or the magnesium she is taking. Neither Jordan nor her husband or her daughter seem to know why she is even  taking those. I think there is also an element of fecal urgency and incontinence related to age-related decline in anorectal motility and muscle tone.   Plan:  Increase Zenpep to 2 capsules with each meal. A new prescription was written Stop Protonix because diarrhea is a common side effect of it is not clear she really needs this medicine. I encouraged him to ask primary care whether a repeat magnesium level would be warranted after she has been off the Protonix for some time. If it is normal, consider stopping the magnesium since diarrhea is a common side effect.  See me as needed  Total time 30 minutes, over half spent in counseling and coordination of care. We reviewed all of her medicines and their many questions were answered.  [name]

## 2016-08-03 NOTE — Patient Instructions (Signed)
If you are age 81 or older, your body mass index should be between 23-30. Your Body mass index is 30.11 kg/m. If this is out of the aforementioned range listed, please consider follow up with your Primary Care Provider.  If you are age 45 or younger, your body mass index should be between 19-25. Your Body mass index is 30.11 kg/m. If this is out of the aformentioned range listed, please consider follow up with your Primary Care Provider.   Thank you for choosing [facility]  Dr Wilfrid Lund III

## 2016-09-11 ENCOUNTER — Encounter: Payer: Self-pay | Admitting: Gastroenterology

## 2016-10-01 ENCOUNTER — Encounter: Payer: Self-pay | Admitting: Gastroenterology

## 2016-10-02 ENCOUNTER — Encounter: Payer: Self-pay | Admitting: Gastroenterology

## 2016-10-04 ENCOUNTER — Telehealth: Payer: Self-pay

## 2016-10-04 ENCOUNTER — Encounter: Payer: Self-pay | Admitting: Gastroenterology

## 2016-10-04 NOTE — Telephone Encounter (Signed)
Tier reduction requested for Zenpep with [facility] on 10-04-2016

## 2016-10-05 ENCOUNTER — Other Ambulatory Visit: Payer: Self-pay

## 2016-10-05 NOTE — Telephone Encounter (Addendum)
Tier reduction was denied. Will try for an appeal or change to [facility].

## 2016-10-12 MED ORDER — PANCRELIPASE (LIP-PROT-AMYL) 36000-114000 UNITS PO CPEP: ORAL_CAPSULE | ORAL | 3 refills | Status: DC

## 2016-10-20 ENCOUNTER — Other Ambulatory Visit: Payer: Self-pay

## 2016-10-20 MED ORDER — PANCRELIPASE (LIP-PROT-AMYL) 36000-114000 UNITS PO CPEP: ORAL_CAPSULE | ORAL | 3 refills | Status: DC

## 2016-10-20 NOTE — Telephone Encounter (Signed)
Per Richardson Landry from [facility], we sent a new RX for Creon to to [facility], pts husband has been notified and aware.

## 2016-10-20 NOTE — Telephone Encounter (Signed)
Per Mr Brownstein, the pts husband, the Creon is $1.50 less a pill. He indicates he is happy with that. Advised to call when they are in need of refills.

## 2016-10-23 ENCOUNTER — Telehealth: Payer: Self-pay | Admitting: Gastroenterology

## 2016-11-15 ENCOUNTER — Encounter: Payer: Self-pay | Admitting: Cardiology

## 2016-11-16 ENCOUNTER — Encounter: Payer: Self-pay | Admitting: Physician Assistant

## 2016-11-22 DIAGNOSIS — H6123 Impacted cerumen, bilateral: Secondary | ICD-10-CM | POA: Insufficient documentation

## 2016-11-22 DIAGNOSIS — H9113 Presbycusis, bilateral: Secondary | ICD-10-CM | POA: Insufficient documentation

## 2016-11-30 NOTE — Progress Notes (Signed)
Cardiology Office Note    Date:  12/01/2016  ID:  Cassandra Ray, [name] 15-Dec-1922, MRN [id number] PCP:  Lawerance Cruel, MD  Cardiologist:  Dr. [name]   Chief Complaint: f/u CHF  History of Present Illness:  Cassandra Ray is a 83 y.o. female with history of HTN, hypothyroidism, breast CA, GERD, diastolic dysfunction/probable chronic diastolic CHF, chronic hyponatremia, pancreatic cyst, questionable prior atrial fibrillation who presents for follow-up.   Per review of Dr. [name] note in 2014 she was hospitalized in [facility] with nausea, vomiting, and weakness after eating seafood. She was also hypertensive. She underwent a stress test which showed no evidence of ischemia, low risk, normal EF. Echocardiogram showed EF of 55-60% with mild LVH, mild pulmonary hypertension, trace TR. She was diagnosed with A. fib RVR and was treated with IV Lopressor, although upon Dr. [name]' review of the strips, he could not clearly state that she had AF as there did appear to be P waves present. Abdominal ultrasound showed an incidental finding of pancreatic cyst with recommendations to repeat in 6-12 months. She underwent event monitor through December/January 2014/2015 which showed no evidence of atrial fibrillation. She was admitted2015 with SOB/hypertensive emergency/pulm edema requiring Lasix - echo with mod LVH, EF 65-70%, grade 2 DD, mild MR, mild LAE. Notable labs 2015 showed Na 123. In 2017 she was seen for worsening shortness of breath with a moderate rihgt pleural effusion treated with Lasix. Thoracentesis was considered but there was not enough fluid to tap by the time she had the procedure. She was also reporting drenching night sweats and was advised to f/u with PCP. Last labs 05/2016 showed K 4.9, CR 1.10, LFTs otherwise OK, Hgb 13.2. She has also followed with GI for ongoing diarrhea at which time she was felt to have pancreatic insufficiency, but also multifactorial. She was placed on Zenpep. GI  recommended she obtain a magnesium level at some point.  She presents back to clinic today with her husband Cassandra Ray. She looks much more lively than the last time I saw her last fall. She reports she is feeling just fine. She is exercising 3x a week in a chair flexibility class at [facility] and feels as a whole she feels better overall. Denies dyspnea, chest pain, fatigue. No further night sweats. The Zenpep has helped her diarrhea quite a bit. Eating well at daycare, has gained a few lbs which hopes to work on losing.   Past Medical History:  Diagnosis Date  . Actinic keratoses   . Arthritis    back   . Atrial fibrillation ([place])    a. questionable episode in 2015 in [facility]. b. event monitor through December/January 2014/2015 which showed no evidence of atrial fibrillation.  . Breast cancer ([place])    Breast cancer (right)  . Chronic diastolic CHF (congestive heart failure) ([place])   . Compression fracture of L3 lumbar vertebra ([facility])   . GERD (gastroesophageal reflux disease)    takes otc Pepcid as needed  . Hiatal hernia 11/27/2014  . HTN (hypertension)   . Hypothyroidism   . Insomnia   . Osteopenia     Past Surgical History:  Procedure Laterality Date  . BREAST LUMPECTOMY Right 1995   lumpectomy  . COLONOSCOPY    . EYE SURGERY Bilateral    cataract w/ lens implant  . KYPHOPLASTY N/A 09/18/2013   Procedure: KYPHOPLASTY;  Surgeon: Sinclair Ship, MD;  Location: [place];  Service: Orthopedics;  Laterality: N/A;  Lumbar 3 kyphoplasty  .  TONSILLECTOMY      Current Medications: Current Meds  Medication Sig  . b complex vitamins tablet Take 1 tablet by mouth daily.  . Calcium Citrate (CITRACAL PO) Take 1 tablet by mouth daily.  . Cholecalciferol (VITAMIN D3) 2000 units TABS Take by mouth daily at 2 PM.  . DULoxetine (CYMBALTA) 30 MG capsule Take 30 mg by mouth daily.   . furosemide (LASIX) 20 MG tablet TAKE 2 TABLETS DAILY AS DIRECTED.  Marland Kitchen Glucosamine-Chondroit-Vit C-Mn (GLUCOSAMINE  1500 COMPLEX PO) Take 1 capsule by mouth daily.  Marland Kitchen levothyroxine (SYNTHROID, LEVOTHROID) 125 MCG tablet Take 125 mcg by mouth daily before breakfast.  . lipase/protease/amylase (CREON) 36000 UNITS CPEP capsule Take 2 capsules with meals,  Take 1 capsule with snacks.  Marland Kitchen losartan (COZAAR) 50 MG tablet Take 50 mg by mouth daily.  . Melatonin 5 MG TABS Take 1 tablet by mouth at bedtime as needed (sleep).  . metoprolol (LOPRESSOR) 100 MG tablet Take 100 mg by mouth 2 (two) times daily.  . pantoprazole (PROTONIX) 40 MG tablet Take 40 mg by mouth daily.  . Probiotic Product (ALIGN PO) Take by mouth daily at 2 PM.  . SYMBICORT 80-4.5 MCG/ACT inhaler Take 2 puffs by mouth 2 (two) times daily.  . vitamin C (ASCORBIC ACID) 500 MG tablet Take 500 mg by mouth daily.     Allergies:   Patient has no known allergies.   Social History   Social History  . Marital status: Married    Spouse name: N/A  . Number of children: N/A  . Years of education: N/A   Social History Main Topics  . Smoking status: Former Research scientist (life sciences)  . Smokeless tobacco: Never Used     Comment: only in college  . Alcohol use 4.2 oz/week    7 Glasses of wine per week     Comment: 1 glass of wine a day  . Drug use: No  . Sexual activity: Not Asked   Other Topics Concern  . None   Social History Narrative  . None     Family History:  Family History  Problem Relation Age of Onset  . CAD Mother   . Diabetes Mellitus II Mother   . Heart failure Mother   . Heart failure Father   . Prostate cancer Brother   . Kidney cancer Brother   . Heart disease Sister   . Esophageal cancer Neg Hx   . Colon cancer Neg Hx     ROS:   Please see the history of present illness.  All other systems are reviewed and otherwise negative.    PHYSICAL EXAM:   VS:  BP 122/72   Pulse 95   Ht 5\' 3"  (1.6 m)   Wt 176 lb (79.8 kg)   LMP  (LMP Unknown)   BMI 31.18 kg/m   BMI: Body mass index is 31.18 kg/m. GEN: Well nourished, well developed  WF, in no acute distress  HEENT: normocephalic, atraumatic Neck: no JVD, carotid bruits, or masses Cardiac: RRR; no murmurs, rubs, or gallops, trace sockline edema  Respiratory:  clear to auscultation bilaterally, normal work of breathing GI: soft, nontender, nondistended, + BS MS: no deformity or atrophy  Skin: warm and dry, no rash, varicose veins bilaterally without evidence of phlebitis Neuro:  Alert and Oriented x 3, Strength and sensation are intact, follows commands Psych: euthymic mood, full affect  Wt Readings from Last 3 Encounters:  12/01/16 176 lb (79.8 kg)  08/03/16 170 lb (77.1 kg)  07/13/16 171 lb 12.8 oz (77.9 kg)      Studies/Labs Reviewed:   EKG:  EKG was ordered today and personally reviewed by me and demonstrates NSR 95bpm, occasional PACs, lower voltage than normal but overall stable  Recent Labs: 02/01/2016: Brain Natriuretic Peptide 173.3; TSH 0.72 05/31/2016: ALT 14; BUN 21; Creatinine, Ser 1.10; Hemoglobin 13.2; Platelets 180.0; Potassium 4.9; Sodium 140   Lipid Panel No results found for: CHOL, TRIG, HDL, CHOLHDL, VLDL, LDLCALC, LDLDIRECT  Additional studies/ records that were reviewed today include: Summarized above.    ASSESSMENT & PLAN:   1. Chronic diastolic CHF - weight is up but I believe this is actual body weight rather than volume as this has been a gradual increase since 2015. She reports a robust appetite. She does not appear significantly volume overloaded, only trace lower extremity edema on exam in the setting of varicose veins. We discussed importance of keeping sodium limited to 2000mg  per day and fluid to 64oz (~2L) per day or less. We also discussed elevation of legs when seated. Will continue Lasix at present dose. She will continue to monitor symptoms. Of note, her GI doctor recently suggested she have a magnesium level by primary care. We called over to PCP's office but she has not had one recently. Will draw along with f/u BMET. 2. R  pleural effusion - improved by exam and symptoms. Continue to monitor for recurrence. 3. Essential HTN - controlled on present regimen. 4. Prior tachycardia - no evidence of recurrence. Still not totally clear if she ever actually had atrial fib. It has been felt that anticoagulation would not be warranted unless we had concrete evidence of recurrence. EKG NSR with PACs today.  Disposition: F/u with Dr. [name] in 6 months as previously recommended.   Medication Adjustments/Labs and Tests Ordered: Current medicines are reviewed at length with the patient today.  Concerns regarding medicines are outlined above. Medication changes, Labs and Tests ordered today are summarized above and listed in the Patient Instructions accessible in Encounters.   Signed, Charlie Pitter, PA-C  12/01/2016 2:00 PM    [facility], [place], [place]  [address] Phone: [phone]; Fax: [phone]

## 2016-12-01 ENCOUNTER — Encounter: Payer: Self-pay | Admitting: Physician Assistant

## 2016-12-01 ENCOUNTER — Ambulatory Visit (INDEPENDENT_AMBULATORY_CARE_PROVIDER_SITE_OTHER): Payer: Medicare Other | Admitting: Physician Assistant

## 2016-12-01 VITALS — BP 122/72 | HR 95 | Ht 63.0 in | Wt 176.0 lb

## 2016-12-01 DIAGNOSIS — J90 Pleural effusion, not elsewhere classified: Secondary | ICD-10-CM

## 2016-12-01 DIAGNOSIS — I10 Essential (primary) hypertension: Secondary | ICD-10-CM | POA: Diagnosis not present

## 2016-12-01 DIAGNOSIS — Z87898 Personal history of other specified conditions: Secondary | ICD-10-CM

## 2016-12-01 DIAGNOSIS — I5032 Chronic diastolic (congestive) heart failure: Secondary | ICD-10-CM

## 2016-12-01 NOTE — Patient Instructions (Addendum)
Medication Instructions:  Your physician recommends that you continue on your current medications as directed. Please refer to the Current Medication list given to you today.   Labwork: TODAY:  BMET & MAGNESIUM  Testing/Procedures: None ordered  Follow-Up: Your physician recommends that you schedule a follow-up appointment in: [place]   Any Other Special Instructions Will Be Listed Below (If Applicable).  For patients with history of fluid retention, we give them these special instructions:  1. Follow a low-salt diet, taking in no more than 2,000mg  of sodium per day. Watch your fluid intake. In general, you should not be taking in more than 2 liters of fluid per day (no more than 8 glasses per day). this is approximately 64oz per day. This includes sources of water in foods like soup, coffee, tea, milk, etc. 2. Weigh yourself on the same scale at same time of day and keep a log. 3. Call your doctor: (Anytime you feel any of the following symptoms)  - 3-4 pound weight gain in 1-2 days or 2 pounds overnight  - Shortness of breath, with or without a dry hacking cough  - Swelling in the hands, feet or stomach  - If you have to sleep on extra pillows at night in order to breathe   IT IS IMPORTANT TO LET YOUR DOCTOR KNOW EARLY ON IF YOU ARE HAVING SYMPTOMS SO WE CAN HELP YOU!      If you need a refill on your cardiac medications before your next appointment, please call your pharmacy.

## 2016-12-02 LAB — BASIC METABOLIC PANEL
BUN / CREAT RATIO: 32 — AB (ref 12–28)
BUN: 43 mg/dL — ABNORMAL HIGH (ref 10–36)
CO2: 25 mmol/L (ref 20–29)
Calcium: 9.5 mg/dL (ref 8.7–10.3)
Chloride: 100 mmol/L (ref 96–106)
Creatinine, Ser: 1.33 mg/dL — ABNORMAL HIGH (ref 0.57–1.00)
GFR, EST AFRICAN AMERICAN: 40 mL/min/{1.73_m2} — AB (ref >59)
GFR, EST NON AFRICAN AMERICAN: 35 mL/min/{1.73_m2} — AB (ref >59)
Glucose: 96 mg/dL (ref 65–99)
POTASSIUM: 4.6 mmol/L (ref 3.5–5.2)
SODIUM: 141 mmol/L (ref 134–144)

## 2016-12-02 LAB — MAGNESIUM: Magnesium: 2.3 mg/dL (ref 1.6–2.3)

## 2016-12-04 ENCOUNTER — Telehealth: Payer: Self-pay | Admitting: *Deleted

## 2016-12-04 MED ORDER — FUROSEMIDE 20 MG PO TABS: ORAL_TABLET | ORAL | 6 refills | Status: DC

## 2016-12-04 NOTE — Telephone Encounter (Signed)
-----   Message from Charlie Pitter, [place] sent at 12/04/2016  7:54 AM EDT ----- Please let patient know her labs actually suggest she is on the drier side. I would recommend cutting Lasix to 20mg  daily with 1 extra tablet daily as needed for shortness of breath, swelling, or weight gain of 3lb or higher. Dayna Dunn PA-C

## 2017-01-08 ENCOUNTER — Telehealth: Payer: Self-pay | Admitting: Cardiology

## 2017-01-08 NOTE — Telephone Encounter (Signed)
[facility] is calling to see if Mrs. Faircloth can get a prescription for Nitro glycerin . Per  Mliss Sax , Mrs. Grosvenor was having some chest tightness on last night and today vitals are good and no chest pains or tightness on today , but would like to have the nitro glycerin prescription sent in to pharmacy . Please call if you have any further questions . Thank s

## 2017-01-09 ENCOUNTER — Encounter: Payer: Self-pay | Admitting: Physician Assistant

## 2017-01-09 MED ORDER — NITROGLYCERIN 0.4 MG SL SUBL: 0.4000 mg | SUBLINGUAL_TABLET | SUBLINGUAL | 99 refills | Status: DC | PRN

## 2017-01-09 NOTE — Telephone Encounter (Signed)
Follow up call    [facility] is checking to see if Dr [name] will give patient a prescription for Nitroglycerin for when she has chest tightness?   Pt c/o of Chest Pain: STAT if CP now or developed within 24 hours  1. Are you having CP right now?  no 2. Are you experiencing any other symptoms (ex. SOB, nausea, vomiting, sweating)? Provider calling did not know   3. How long have you been experiencing CP? As far as provider know this just started happening  4. Is your CP continuous or coming and going?  Not continous 5. Have you taken Nitroglycerin?  Does not have any ?

## 2017-01-09 NOTE — Telephone Encounter (Signed)
Reviewed information with Melina Copa, PA who gives approval for SL ntg however suggests f/u appt for chest tightness evaluation since this is a new symptom.  Spoke with Mliss Sax at [facility] who is reporting pt have 2 episodes of chest tightness Sunday night that resolved on their own.  She has not reported any further chest tightness since.  Scheduled f/u appt for further eval and sent in RX for SL Ntg as ordered.  Aware to c/b if further concerns and to report to ED for eval if tightness is not relieved withSL Ntg X 3.

## 2017-02-04 NOTE — Progress Notes (Signed)
Cardiology Office Note    Date:  02/06/2017  ID:  Cassandra Ray, DOB 06/18/1922, MRN [id number] PCP:  Cassandra Cruel, MD  Cardiologist:  Dr. Marlou Ray   Chief Complaint: chest pain  History of Present Illness:  Cassandra Ray is a 83 y.o. female with history of HTN, hypothyroidism, breast CA, GERD, diastolic dysfunction/probable chronic diastolic CHF, chronic hyponatremia, pancreatic cyst, questionable prior atrial fibrillation who presents for follow-up.   Per review of Dr. Marlou Ray note in 2014 she was hospitalized in [facility] with nausea, vomiting, and weakness after eating seafood. She was also hypertensive. She underwent a stress test which showed no evidence of ischemia, low risk, normal EF. Echocardiogram showed EF of 55-60% with mild LVH, mild pulmonary hypertension, trace TR. She was diagnosed with A. fib RVR and was treated with IV Lopressor, although upon Dr. Marlou Ray' review of the strips, he could not clearly state that she had AF as there did appear to be P waves present. Abdominal ultrasound showed an incidental finding of pancreatic cyst with recommendations to repeat in 6-12 months. She underwent event monitor through December/January 2014/2015 which showed no evidence of atrial fibrillation. She was admitted2015 with SOB/hypertensive emergency/pulm edema requiring Lasix - echo with mod LVH, EF 65-70%, grade 2 DD, mild MR, mild LAE. Notable labs 2015 showed Na 123. In 2017 she was seen for worsening shortness of breath with a moderate rihgt pleural effusion treated with Lasix. Thoracentesis was considered but there was not enough fluid to tap by the time she had the procedure. She was also reporting drenching night sweats and was advised to f/u with PCP. She has also followed with GI for ongoing diarrhea at which time she was felt to have pancreatic insufficiency, but also multifactorial. She was placed on Zenpep. When I saw her in f/u 12/01/16 she was actually doing much better than the  fall before. Labs 11/2016 showed mild AKI with BUN 43/Cr 1.33 (baseline 0.9-1.1) prompting reduction in Lasix to 20mg  daily with 1 extra tablet only PRN, Mg 2.3.   Her husband Bill sent in a [employer] regarding an episode of chest pain, prompting today's visit. About a month ago she awoke in the middle of the night to use the restroom and noticed a sense of chest pressure while sitting on the side of the bed. This was reminiscent of prior acid reflux/indigestion. It lasted about a minute. She felt increased perspiration. Symptoms eased off without any intervention and she went back to sleep. A few hours later she was already awake when she had recurrence of the same chest discomfort. She did not have any palpitations, SOB, nausea or vomiting. She immediately went back into her usual activities that day and has felt fine since. She has continued to participate in chair exercise class 3x a week without any chest pain or dyspnea. No LEE, orthopnea, PND, cough, fever or bleeding.    Past Medical History:  Diagnosis Date  . Actinic keratoses   . Arthritis    back   . Atrial fibrillation ([place])    a. questionable episode in 2015 in [facility]. b. event monitor through December/January 2014/2015 which showed no evidence of atrial fibrillation.  . Breast cancer ([place])    Breast cancer (right)  . Chronic diastolic CHF (congestive heart failure) ([place])   . Compression fracture of L3 lumbar vertebra ([facility])   . GERD (gastroesophageal reflux disease)    takes otc Pepcid as needed  . Hiatal hernia 11/27/2014  . HTN (  hypertension)   . Hypothyroidism   . Insomnia   . Osteopenia     Past Surgical History:  Procedure Laterality Date  . BREAST LUMPECTOMY Right 1995   lumpectomy  . COLONOSCOPY    . EYE SURGERY Bilateral    cataract w/ lens implant  . KYPHOPLASTY N/A 09/18/2013   Procedure: KYPHOPLASTY;  Surgeon: Sinclair Ship, MD;  Location: [place];  Service: Orthopedics;  Laterality: N/A;  Lumbar 3  kyphoplasty  . TONSILLECTOMY      Current Medications: Current Meds  Medication Sig  . b complex vitamins tablet Take 1 tablet by mouth daily.  . Calcium Citrate (CITRACAL PO) Take 1 tablet by mouth daily.  . Cholecalciferol (VITAMIN D3) 2000 units TABS Take by mouth daily at 2 PM.  . DULoxetine (CYMBALTA) 30 MG capsule Take 30 mg by mouth daily.   . furosemide (LASIX) 20 MG tablet Take 20 mg by mouth daily.  . Glucosamine-Chondroit-Vit C-Mn (GLUCOSAMINE 1500 COMPLEX PO) Take 1 capsule by mouth daily.  Marland Kitchen levothyroxine (SYNTHROID, LEVOTHROID) 125 MCG tablet Take 125 mcg by mouth daily before breakfast.  . lipase/protease/amylase (CREON) 36000 UNITS CPEP capsule Take 72,000 Units by mouth 3 (three) times daily before meals.  Marland Kitchen losartan (COZAAR) 50 MG tablet Take 50 mg by mouth daily.  . Melatonin 5 MG TABS Take 1 tablet by mouth at bedtime as needed (sleep).  . metoprolol (LOPRESSOR) 100 MG tablet Take 100 mg by mouth 2 (two) times daily.  . nitroGLYCERIN (NITROSTAT) 0.4 MG SL tablet Place 1 tablet (0.4 mg total) under the tongue every 5 (five) minutes as needed for chest pain.  . pantoprazole (PROTONIX) 40 MG tablet Take 40 mg by mouth daily.  . Probiotic Product (ALIGN PO) Take by mouth daily at 2 PM.  . SYMBICORT 80-4.5 MCG/ACT inhaler Take 2 puffs by mouth 2 (two) times daily.  . vitamin C (ASCORBIC ACID) 500 MG tablet Take 500 mg by mouth daily.  . [DISCONTINUED] furosemide (LASIX) 20 MG tablet Take 1 tablet by mouth daily and 1 tablet by mouth daily as needed for swelling  . [DISCONTINUED] lipase/protease/amylase (CREON) 36000 UNITS CPEP capsule Take 2 capsules with meals,  Take 1 capsule with snacks.     Allergies:   Patient has no known allergies.   Social History   Social History  . Marital status: Married    Spouse name: N/A  . Number of children: N/A  . Years of education: N/A   Social History Main Topics  . Smoking status: Former Research scientist (life sciences)  . Smokeless tobacco: Never  Used     Comment: only in college  . Alcohol use 4.2 oz/week    7 Glasses of wine per week     Comment: 1 glass of wine a day  . Drug use: No  . Sexual activity: Not Asked   Other Topics Concern  . None   Social History Narrative  . None     Family History:  Family History  Problem Relation Age of Onset  . CAD Mother   . Diabetes Mellitus II Mother   . Heart failure Mother   . Heart failure Father   . Prostate cancer Brother   . Kidney cancer Brother   . Heart disease Sister   . Esophageal cancer Neg Hx   . Colon cancer Neg Hx     ROS:   Please see the history of present illness.  All other systems are reviewed and otherwise negative.  PHYSICAL EXAM:   VS:  BP 118/64   Pulse 92   Ht 5\' 4"  (1.626 m)   Wt 173 lb (78.5 kg)   LMP  (LMP Unknown)   BMI 29.70 kg/m   BMI: Body mass index is 29.7 kg/m. GEN: Well nourished, well developed WF, in no acute distress  HEENT: normocephalic, atraumatic Neck: no JVD, carotid bruits, or masses Cardiac: RRR; no murmurs, rubs, or gallops, trace sockline edema Respiratory:  clear to auscultation bilaterally, normal work of breathing GI: soft, nontender, nondistended, + BS MS: no deformity or atrophy  Skin: warm and dry, no rash, varicose veins bilaterally without evidence of phlebitis Neuro:  Alert and Oriented x 3, Strength and sensation are intact, follows commands Psych: euthymic mood, full affect  Wt Readings from Last 3 Encounters:  02/06/17 173 lb (78.5 kg)  12/01/16 176 lb (79.8 kg)  08/03/16 170 lb (77.1 kg)      Studies/Labs Reviewed:   EKG:  EKG was ordered today and personally reviewed by me and demonstrates NSR 92bpm, cannot exclude prior anteiror rinfarct, left axis deviation. No acute changes  Recent Labs: 05/31/2016: ALT 14; Hemoglobin 13.2; Platelets 180.0 12/01/2016: BUN 43; Creatinine, Ser 1.33; Magnesium 2.3; Potassium 4.6; Sodium 141   Lipid Panel No results found for: CHOL, TRIG, HDL, CHOLHDL,  VLDL, LDLCALC, LDLDIRECT  Additional studies/ records that were reviewed today include: Summarized above    ASSESSMENT & PLAN:   1. Chest pain - mostly atypical. Cannot fully r/o angina for that night as she did not seek care at the time. This was short-lived, total duration was only a few minutes. She has not had any recurrent symptoms, including with exertion, since initial episode 1 month ago. EKG appears generally unchanged compared to prior. We discussed various modalities for evaluation, including nuclear stress test versus observation for recurrent symptoms. She prefers to observe for recurrent symptoms without further testing at this time. Given her advanced age and atypical symptoms, I do not feel this is unreasonable. Will check CBC to exclude angina from anemia given h/o acid reflux. Warning symptoms reviewed. She will notify our office ASAP or seek care if she has recurrent symptoms. Continue PPI. 2. Chronic diastolic CHF - appears euvolemic. Continue present regimen. Recheck BMET as below. 3. Essential HTN - controlled. I have elected not to add empiric antianginal given normal BP and age. She is already on a significant dose of Lopressor. 4. Dehydration/history of renal insufficiency - recheck BMET today to trend.  Disposition: F/u with Dr. Marlou Ray as scheduled 05/2017.   Medication Adjustments/Labs and Tests Ordered: Current medicines are reviewed at length with the patient today.  Concerns regarding medicines are outlined above. Medication changes, Labs and Tests ordered today are summarized above and listed in the Patient Instructions accessible in Encounters.   Signed, Charlie Pitter, PA-C  02/06/2017 2:00 PM    [facility] [address], [place], [place]  [address] Phone: [phone]; Fax: [phone]

## 2017-02-06 ENCOUNTER — Ambulatory Visit (INDEPENDENT_AMBULATORY_CARE_PROVIDER_SITE_OTHER): Payer: Medicare Other | Admitting: Physician Assistant

## 2017-02-06 ENCOUNTER — Encounter: Payer: Self-pay | Admitting: Physician Assistant

## 2017-02-06 VITALS — BP 118/64 | HR 92 | Ht 64.0 in | Wt 173.0 lb

## 2017-02-06 DIAGNOSIS — I10 Essential (primary) hypertension: Secondary | ICD-10-CM

## 2017-02-06 DIAGNOSIS — Z87448 Personal history of other diseases of urinary system: Secondary | ICD-10-CM

## 2017-02-06 DIAGNOSIS — I5032 Chronic diastolic (congestive) heart failure: Secondary | ICD-10-CM

## 2017-02-06 DIAGNOSIS — R072 Precordial pain: Secondary | ICD-10-CM

## 2017-02-06 NOTE — Patient Instructions (Signed)
Medication Instructions:  Your physician recommends that you continue on your current medications as directed. Please refer to the Current Medication list given to you today.  Labwork: TODAY:  BMET & CBC  Testing/Procedures: None ordered  Follow-Up: Your physician recommends that you schedule a follow-up appointment in: December WITH DR. [name]   Any Other Special Instructions Will Be Listed Below (If Applicable).     If you need a refill on your cardiac medications before your next appointment, please call your pharmacy.

## 2017-02-07 ENCOUNTER — Encounter: Payer: Self-pay | Admitting: Physician Assistant

## 2017-02-07 LAB — BASIC METABOLIC PANEL
BUN / CREAT RATIO: 27 (ref 12–28)
BUN: 36 mg/dL (ref 10–36)
CALCIUM: 9.4 mg/dL (ref 8.7–10.3)
CHLORIDE: 99 mmol/L (ref 96–106)
CO2: 25 mmol/L (ref 20–29)
CREATININE: 1.35 mg/dL — AB (ref 0.57–1.00)
GFR calc Af Amer: 40 mL/min/{1.73_m2} — ABNORMAL LOW (ref >59)
GFR calc non Af Amer: 34 mL/min/{1.73_m2} — ABNORMAL LOW (ref >59)
Glucose: 91 mg/dL (ref 65–99)
Potassium: 4.6 mmol/L (ref 3.5–5.2)
Sodium: 139 mmol/L (ref 134–144)

## 2017-02-07 LAB — CBC
HEMATOCRIT: 37.4 % (ref 34.0–46.6)
HEMOGLOBIN: 12.2 g/dL (ref 11.1–15.9)
MCH: 30.7 pg (ref 26.6–33.0)
MCHC: 32.6 g/dL (ref 31.5–35.7)
MCV: 94 fL (ref 79–97)
Platelets: 208 10*3/uL (ref 150–379)
RBC: 3.97 x10E6/uL (ref 3.77–5.28)
RDW: 12.9 % (ref 12.3–15.4)
WBC: 7.8 10*3/uL (ref 3.4–10.8)

## 2017-03-02 ENCOUNTER — Telehealth: Payer: Self-pay | Admitting: Gastroenterology

## 2017-03-02 NOTE — Telephone Encounter (Signed)
APP appointment sounds like a good idea, since I don't think I have anything sooner.  Could be an anal fissue is having pain.  Try OTC Recticare ointment 2-3 times daily in the meantime.

## 2017-03-02 NOTE — Telephone Encounter (Signed)
Spoke to patient's husband, has noticed external hemorrhoid. Wife states having rectal pain, denies bleeding. They have tried OTC Preparation H cream, but not suppositories. I have rescheduled her to see APP sooner.

## 2017-03-02 NOTE — Telephone Encounter (Signed)
Patient's husband requested to send recommendations through patient portal. Message sent.

## 2017-03-05 ENCOUNTER — Encounter: Payer: Self-pay | Admitting: Physician Assistant

## 2017-03-05 ENCOUNTER — Ambulatory Visit (INDEPENDENT_AMBULATORY_CARE_PROVIDER_SITE_OTHER): Payer: Medicare Other | Admitting: Physician Assistant

## 2017-03-05 VITALS — BP 118/76 | HR 74 | Ht 64.0 in | Wt 173.0 lb

## 2017-03-05 DIAGNOSIS — K648 Other hemorrhoids: Secondary | ICD-10-CM

## 2017-03-05 DIAGNOSIS — K8689 Other specified diseases of pancreas: Secondary | ICD-10-CM

## 2017-03-05 DIAGNOSIS — R197 Diarrhea, unspecified: Secondary | ICD-10-CM

## 2017-03-05 NOTE — Progress Notes (Signed)
Subjective:    Patient ID: Cassandra Ray, female    DOB: 05-13-1923, 83 y.o.   MRN: [id number]  HPI  Cassandra Ray is a very nice 83 year old white female, known to Dr. Loletha Carrow with history of diarrhea secondary to pancreatic insufficiency. She is currently doing very well on Zenpep 2 capsules with each meal. She is not currently having any problems with diarrhea but says she continues to be bothered by gas. She comes in today with complaints of rectal pain secondary to hemorrhoid. She had been uncomfortable for most of last week and she has had a very small amount of bleeding off and on. She has been doing tub soaks with hot water and Epsom salts, and using Preparation H and Nupercaine ointment. She denies any excessive straining etc. She says actually ays she has felt a  little better for the past day or 2. Her husband   is concerned because he says he saw  a place  about the size of a "pea". He also mentions that his GI doctor has banded his hemorrhoids.  Review of Systems Pertinent positive and negative review of systems were noted in the above HPI section.  All other review of systems was otherwise negative.  Outpatient Encounter Prescriptions as of 03/05/2017  Medication Sig  . b complex vitamins tablet Take 1 tablet by mouth daily.  . Calcium Citrate (CITRACAL PO) Take 1 tablet by mouth daily.  . Cholecalciferol (VITAMIN D3) 2000 units TABS Take by mouth daily at 2 PM.  . DULoxetine (CYMBALTA) 30 MG capsule Take 30 mg by mouth daily.   . furosemide (LASIX) 20 MG tablet Take 20 mg by mouth daily.  . Glucosamine-Chondroit-Vit C-Mn (GLUCOSAMINE 1500 COMPLEX PO) Take 1 capsule by mouth daily.  Marland Kitchen levothyroxine (SYNTHROID, LEVOTHROID) 125 MCG tablet Take 125 mcg by mouth daily before breakfast.  . lipase/protease/amylase (CREON) 36000 UNITS CPEP capsule Take 72,000 Units by mouth 3 (three) times daily before meals.  Marland Kitchen losartan (COZAAR) 50 MG tablet Take 50 mg by mouth daily.  . Melatonin 5 MG TABS  Take 1 tablet by mouth at bedtime as needed (sleep).  . metoprolol (LOPRESSOR) 100 MG tablet Take 100 mg by mouth 2 (two) times daily.  . nitroGLYCERIN (NITROSTAT) 0.4 MG SL tablet Place 1 tablet (0.4 mg total) under the tongue every 5 (five) minutes as needed for chest pain.  . pantoprazole (PROTONIX) 40 MG tablet Take 40 mg by mouth daily.  . Probiotic Product (ALIGN PO) Take by mouth daily at 2 PM.  . SYMBICORT 80-4.5 MCG/ACT inhaler Take 2 puffs by mouth 2 (two) times daily.  . vitamin C (ASCORBIC ACID) 500 MG tablet Take 500 mg by mouth daily.   No facility-administered encounter medications on file as of 03/05/2017.    No Known Allergies Patient Active Problem List   Diagnosis Date Noted  . Bilateral impacted cerumen 11/22/2016  . Presbycusis of both ears 11/22/2016  . Pleural effusion, right 02/08/2016  . Acute on chronic diastolic CHF (congestive heart failure) ([facility]) 02/08/2016  . Lower extremity edema 01/31/2016  . Chronic diastolic heart failure ([facility]) 02/03/2015  . Hyponatremia 04/25/2014  . SOB (shortness of breath)   . Hypertensive urgency 04/24/2014  . Compression fracture 09/18/2013  . Knee pain 08/11/2013  . A-fib ([facility]) 05/09/2013  . Back pain 05/09/2013  . HTN (hypertension)   . Hypothyroidism    Social History   Social History  . Marital status: Married    Spouse name: N/A  .  Number of children: N/A  . Years of education: N/A   Occupational History  . Not on file.   Social History Main Topics  . Smoking status: Former Research scientist (life sciences)  . Smokeless tobacco: Never Used     Comment: only in college  . Alcohol use 4.2 oz/week    7 Glasses of wine per week     Comment: 1 glass of wine a day  . Drug use: No  . Sexual activity: Not on file   Other Topics Concern  . Not on file   Social History Narrative  . No narrative on file    Ms. Cassandra Ray's family history includes CAD in her mother; Diabetes Mellitus II in her mother; Heart disease in her sister; Heart  failure in her father and mother; Kidney cancer in her brother; Prostate cancer in her brother.      Objective:    Vitals:   03/05/17 1430  BP: 118/76  Pulse: 74    Physical Exam well-developed elderly white female in no acute distress, pleasant accompanied by her husband blood pressure 118/76 pulse 74, BMI 29.7. Ambulates with a walker HEENT ;nontraumatic normocephalic EOMI PERRLA sclera anicteric, Cardiovascular ;regular rate and rhythm with S1-S2, Pulmonary clear bilaterally, Abdomen; soft, nontender nondistended bowel sounds are present, Rectal; exam patient has a partially prolapsed internal hemorrhoid just at the anal verge, inflamed, not thrombosed, tender to digital exam. Ext; no clubbing cyanosis or edema skin warm and dry, Neuropsych; mood and affect appropriate       Assessment & Plan:   #83 year old female with history of chronic diarrhea secondary to pancreatic insufficiency-currently doing well on current dose of pancreatic enzyme supplement, Zenpep 40,000 units 2 by mouth with each meal #2 inflamed symptomatic internal hemorrhoid #3 GERD, #4 diverticulosis #5 congestive heart failure #6 hypertension #History of atrial fibrillation  Plan; start preparation H suppositories daily at bedtime 10 days then as needed Continue Nupercainal ointment as needed As symptoms have improved she may stop Tub soaks Continue current dose Zenpep  outlined above Try Gas-X when necessary Patient is asked to call back in 2 weeks if symptoms are not significantly improved. I explained to her husband that we generally do not do hemorrhoidal banding for an isolated flare of the internal hemorrhoidal symptoms        Cassandra Ray Genia Harold PA-C 03/05/2017   Cc: Lawerance Cruel, MD

## 2017-03-05 NOTE — Patient Instructions (Signed)
Preperation H suppositores at bedtime for 10 days. Add Gas-X with meals.   Call [name]'s nurse, Beth, if symptoms are persisting in 2 weeks.

## 2017-03-06 NOTE — Progress Notes (Signed)
Thank you for sending this case to me. I have reviewed the entire note, and the outlined plan seems appropriate.  I agree with conservative management of this elderly woman.  Wilfrid Lund, MD

## 2017-03-19 ENCOUNTER — Ambulatory Visit: Payer: Medicare Other | Admitting: Gastroenterology

## 2017-03-21 ENCOUNTER — Other Ambulatory Visit: Payer: Self-pay | Admitting: Gastroenterology

## 2017-04-13 ENCOUNTER — Other Ambulatory Visit: Payer: Self-pay | Admitting: Gastroenterology

## 2017-05-01 ENCOUNTER — Other Ambulatory Visit: Payer: Self-pay | Admitting: Family Medicine

## 2017-05-01 DIAGNOSIS — Z1231 Encounter for screening mammogram for malignant neoplasm of breast: Secondary | ICD-10-CM

## 2017-05-23 ENCOUNTER — Encounter: Payer: Self-pay | Admitting: Cardiology

## 2017-05-23 ENCOUNTER — Ambulatory Visit (INDEPENDENT_AMBULATORY_CARE_PROVIDER_SITE_OTHER): Payer: Medicare Other | Admitting: Cardiology

## 2017-05-23 VITALS — BP 125/69 | HR 77 | Ht 64.0 in | Wt 154.4 lb

## 2017-05-23 DIAGNOSIS — J90 Pleural effusion, not elsewhere classified: Secondary | ICD-10-CM

## 2017-05-23 DIAGNOSIS — I5032 Chronic diastolic (congestive) heart failure: Secondary | ICD-10-CM

## 2017-05-23 DIAGNOSIS — R072 Precordial pain: Secondary | ICD-10-CM | POA: Diagnosis not present

## 2017-05-23 NOTE — Patient Instructions (Signed)
Medication Instructions:  Your physician recommends that you continue on your current medications as directed. Please refer to the Current Medication list given to you today.  Labwork: None ordered   Testing/Procedures: None ordered   Follow-Up: Your physician wants you to follow-up in: 6 months with Melina Copa, PA. You will receive a reminder letter in the mail two months in advance. If you don't receive a letter, please call our office to schedule the follow-up appointment.  Your physician wants you to follow-up in: 1 year with Dr. [name]. You will receive a reminder letter in the mail two months in advance. If you don't receive a letter, please call our office to schedule the follow-up appointment.   Any Other Special Instructions Will Be Listed Below (If Applicable).     If you need a refill on your cardiac medications before your next appointment, please call your pharmacy.

## 2017-05-23 NOTE — Progress Notes (Signed)
Cassandra Ray. [address] [place], [place]  [address] Phone: [phone] Fax:  [phone]  Date:  05/23/2017   ID:  Cassandra Ray, DOB 11/17/1922, MRN [id number]  PCP:  Lawerance Cruel, MD   History of Present Illness: Cassandra Ray is a 83 y.o. female with dyspnea, grade 2 diastolic dysfunction, normal ejection fraction, hypertension here for followup.   She was hospitalized on 04/25/14 with shortness of breath. She was quite hypertensive 202/106. She was given IV Lasix, Nitropaste, proBNP was elevated to 824, chest x-ray showed pulmonary edema. She also had hyponatremia. She was eventually discharged on Lasix 20 mg every other day. Echocardiogram 04/25/14 showed EF of 25%, grade 2 diastolic dysfunction.  She underwent event monitor through December/January 2014/2015 which showed no evidence of atrial fibrillation. Excellent. She's had no further palpitations.   07/22/15-knee osteoarthritis. I felt she was too high risk to undergo knee replacement surgery.no chest pain, no shortness of breath. Lives at [facility]. Sometimes has coughing when eating breakfast. Deals with mucus production. She is worried about pneumonia.  05/12/16-previous visit with Melina Copa reviewed. She is doing quite well with her weights, stable. No significant shortness of breath. She did have a moderate pleural effusion previously in August, on auscultation, her right lower lobe lung field seems improved. Continue with current Lasix dosing. No significant shortness of breath. No chest pain.  05/23/17-I reviewed prior office visit with Dayna done on 02/06/17.  She woke up in the middle overnight to use the restroom and noted chest pressure while sitting on side of the bed.  Felt like GERD.  Perspiration.  Back to sleep.  This resolved finally and she continued to participate in chair exercise class 3 times a week without any difficulties.  Today she reports that sometimes after eating a large meal her  voice will crackle sometimes she develops significant mucus secretions.  She has been seeing Dr. Loletha Carrow for GI.  Her husband suggested that she may take a Mucinex and this is not unreasonable.  I think she is currently euvolemic.  No further chest pain.  Wt Readings from Last 3 Encounters:  05/23/17 154 lb 6.4 oz (70 kg)  03/05/17 173 lb (78.5 kg)  02/06/17 173 lb (78.5 kg)     Past Medical History:  Diagnosis Date  . Actinic keratoses   . Arthritis    back   . Atrial fibrillation ([facility])    a. questionable episode in 2015 in [facility]. b. event monitor through December/January 2014/2015 which showed no evidence of atrial fibrillation.  . Breast cancer ([facility])    Breast cancer (right)  . Chronic diastolic CHF (congestive heart failure) ([facility])   . Compression fracture of L3 lumbar vertebra ([facility])   . GERD (gastroesophageal reflux disease)    takes otc Pepcid as needed  . Hiatal hernia 11/27/2014  . HTN (hypertension)   . Hypothyroidism   . Insomnia   . Osteopenia     Past Surgical History:  Procedure Laterality Date  . BREAST LUMPECTOMY Right 1995   lumpectomy  . COLONOSCOPY    . EYE SURGERY Bilateral    cataract w/ lens implant  . KYPHOPLASTY N/A 09/18/2013   Procedure: KYPHOPLASTY;  Surgeon: Sinclair Ship, MD;  Location: [facility];  Service: Orthopedics;  Laterality: N/A;  Lumbar 3 kyphoplasty  . TONSILLECTOMY      Current Outpatient Medications  Medication Sig Dispense Refill  . b complex vitamins tablet Take 1 tablet by  mouth daily.    . Calcium Citrate (CITRACAL PO) Take 1 tablet by mouth daily.    . Cholecalciferol (VITAMIN D3) 2000 units TABS Take by mouth daily at 2 PM.    . CREON 36000 units CPEP capsule TAKE 2 CAPSULES WITH MEALS AND 1 CAPSULE WITH SNACKS. 150 capsule 3  . DULoxetine (CYMBALTA) 30 MG capsule Take 30 mg by mouth daily.     . furosemide (LASIX) 20 MG tablet Take 20 mg by mouth daily.    . Glucosamine-Chondroit-Vit C-Mn (GLUCOSAMINE 1500 COMPLEX PO) Take 1  capsule by mouth daily.    Marland Kitchen levothyroxine (SYNTHROID, LEVOTHROID) 125 MCG tablet Take 125 mcg by mouth daily before breakfast.    . losartan (COZAAR) 50 MG tablet Take 50 mg by mouth daily.    . Melatonin 5 MG TABS Take 1 tablet by mouth at bedtime as needed (sleep).    . metoprolol (LOPRESSOR) 100 MG tablet Take 100 mg by mouth 2 (two) times daily.    . pantoprazole (PROTONIX) 40 MG tablet Take 40 mg by mouth daily.    . Probiotic Product (ALIGN PO) Take by mouth daily at 2 PM.    . SYMBICORT 80-4.5 MCG/ACT inhaler Take 2 puffs by mouth 2 (two) times daily.    . vitamin C (ASCORBIC ACID) 500 MG tablet Take 500 mg by mouth daily.    . nitroGLYCERIN (NITROSTAT) 0.4 MG SL tablet Place 1 tablet (0.4 mg total) under the tongue every 5 (five) minutes as needed for chest pain. 25 tablet prn   No current facility-administered medications for this visit.     Allergies:   No Known Allergies  Social History:  The patient  reports that she has quit smoking. she has never used smokeless tobacco. She reports that she drinks about 4.2 oz of alcohol per week. She reports that she does not use drugs.   ROS:  Please see the history of present illness.   No syncope, no fevers. Positive back pain. No chest pain.    PHYSICAL EXAM: VS:  BP 125/69   Pulse 77   Ht 5\' 4"  (1.626 m)   Wt 154 lb 6.4 oz (70 kg)   LMP  (LMP Unknown)   SpO2 99%   BMI 26.50 kg/m  GEN: Utilizing walker well nourished, well developed, in no acute distress  HEENT: normal  Neck: no JVD, carotid bruits, or masses Cardiac: RRR; no murmurs, rubs, or gallops,no edema  Respiratory:  clear to auscultation bilaterally, normal work of breathing GI: soft, nontender, nondistended, + BS MS: no deformity or atrophy  Skin: warm and dry, no rash Neuro:  Alert and Oriented x 3, Strength and sensation are intact Psych: euthymic mood, full affect   EKG:  EKG was not ordered today. Prior 07/22/15 sinus rhythm with first-degree AV block heart  rate 66 bpm, PR interval 226 ms.-Prior EKGs from hospitalization reviewed. P waves are present however challenging to make a definitive diagnosis of atrial fibrillation. Intervals appear quite normal. Could be PAT?    Echo: 02/02/16 - Left ventricle: The cavity size was normal. Wall thickness was   increased in a pattern of moderate LVH. There was focal basal   hypertrophy. Systolic function was normal. The estimated ejection   fraction was in the range of 60% to 65%. Wall motion was normal;   there were no regional wall motion abnormalities. Features are   consistent with a pseudonormal left ventricular filling pattern,   with concomitant abnormal relaxation and  increased filling   pressure (grade 2 diastolic dysfunction).  ASSESSMENT AND PLAN:  1. Diastolic heart failure-grade 2 diastolic dysfunction, normal ejection fraction. Responded previously to IV diuresis. Hyponatremia appears to be chronic. Lasix 20 mg daily. She understands that if her weight gains by 3-5 pounds she may take an extra Lasix dose.  She appears euvolemic today again.  Some of her coughing at times or changes in her voice could be GI related from acid reflux.  She does have deconditioning as well, continue to encourage exercise. 2. Possible atrial fibrillation paroxysmal- Event monitor was reassuring. No evidence of atrial fibrillation over the month of December/January 2015. She states that she did not realize that she was in atrial fibrillation when she went to the emergency room. They did not start her on anticoagulation in the hospital which may lead me to believe that there could of been question in the diagnosis of atrial fibrillation as well or perhaps it was not started because of short episode. Stress test, echocardiogram reassuring. Excellent. In fact, she is interested in coming off of her aspirin. Since we would be utilizing the aspirin purely for primary prevention, given her advanced age, greater than 30, her risk  of bleeding from aspirin likely outweighs the risk.  3. Hypertension-well controlled. Agree with metoprolol as well as angiotensin receptor blocker given her current condition. 4. Pleural effusion-right sided, likely related to diastolic heart failure.  Last x-ray finding on 02/01/16 noted right-sided pleural effusion. Breathing is better.  5. Dayna in 6 months, me in one year.  Signed, Candee Furbish, MD [facility]  05/23/2017 10:56 AM

## 2017-05-26 ENCOUNTER — Other Ambulatory Visit: Payer: Self-pay | Admitting: Cardiology

## 2017-06-01 ENCOUNTER — Ambulatory Visit
Admission: RE | Admit: 2017-06-01 | Discharge: 2017-06-01 | Disposition: A | Discharge status: Home / Self Care | Payer: Medicare Other | Source: Ambulatory Visit | Attending: Family Medicine | Admitting: Family Medicine

## 2017-06-01 DIAGNOSIS — Z1231 Encounter for screening mammogram for malignant neoplasm of breast: Secondary | ICD-10-CM

## 2017-08-20 ENCOUNTER — Other Ambulatory Visit: Payer: Self-pay | Admitting: Gastroenterology

## 2017-08-28 ENCOUNTER — Encounter: Payer: Self-pay | Admitting: Gastroenterology

## 2017-08-28 ENCOUNTER — Ambulatory Visit: Payer: Medicare Other | Admitting: Gastroenterology

## 2017-08-28 VITALS — BP 106/70 | HR 80 | Ht 63.0 in | Wt 177.5 lb

## 2017-08-28 DIAGNOSIS — R1314 Dysphagia, pharyngoesophageal phase: Secondary | ICD-10-CM

## 2017-08-28 DIAGNOSIS — K529 Noninfective gastroenteritis and colitis, unspecified: Secondary | ICD-10-CM

## 2017-08-28 NOTE — Patient Instructions (Signed)
If you are age 82 or older, your body mass index should be between 23-30. Your Body mass index is 31.44 kg/m. If this is out of the aforementioned range listed, please consider follow up with your Primary Care Provider.  If you are age 35 or younger, your body mass index should be between 19-25. Your Body mass index is 31.44 kg/m. If this is out of the aformentioned range listed, please consider follow up with your Primary Care Provider.   We will contact you with the information to speech pathology.   Thank you for choosing [facility]  Dr Wilfrid Lund III

## 2017-08-28 NOTE — Progress Notes (Signed)
[facility] Progress Note  Chief Complaint: Oral pharyngeal dysphagia  Subjective  History:   This is a pleasant 84 year old woman known to me from prior office visits.  She has had diarrhea that is believed to be at least partially from age-related pancreatic insufficiency, treated with pancreatic enzyme supplements.  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  Modified barium study in August 2017 showed aspiration of thin liquids and confirm large hiatal hernia.  Cassandra Ray is accompanied by her daughter today.  Her symptoms are the same as before, with food sometimes stuck in the neck which then causes a lot of mucus production.  She finds this very distressing in the dining hall at their assisted living facility.  Her husband was unable to come today, but the daughter reports that he believes he hears Cassandra Ray wheezing at night.  It does not sound like they have brought this to the attention of the primary care provider.  Cassandra Ray also still has some intermittent diarrhea despite taking pancreatic enzyme supplements. It does not sound as if she has lost weight. ROS: Cardiovascular:  no chest pain Respiratory: no dyspnea  The patient's Past Medical, Family and Social History were reviewed and are on file in the EMR.  Objective:  Med list reviewed  Current Outpatient Medications:  .  b complex vitamins tablet, Take 1 tablet by mouth daily., Disp: , Rfl:  .  Calcium Citrate (CITRACAL PO), Take 1 tablet by mouth daily., Disp: , Rfl:  .  Cholecalciferol (VITAMIN D3) 2000 units TABS, Take by mouth daily at 2 PM., Disp: , Rfl:  .  CREON 36000 units CPEP capsule, TAKE 2 CAPSULES WITH MEALS AND 1 CAPSULE WITH SNACKS., Disp: 150 capsule, Rfl: 0 .  DULoxetine (CYMBALTA) 30 MG capsule, Take 30 mg by mouth daily. , Disp: , Rfl:  .  furosemide (LASIX) 20 MG tablet, TAKE 2 TABLETS DAILY AS  DIRECTED., Disp: 60 tablet, Rfl: 11 .  Glucosamine-Chondroit-Vit C-Mn (GLUCOSAMINE 1500 COMPLEX PO), Take 1 capsule by mouth daily., Disp: , Rfl:  .  levothyroxine (SYNTHROID, LEVOTHROID) 125 MCG tablet, Take 125 mcg by mouth daily before breakfast., Disp: , Rfl:  .  loperamide (IMODIUM A-D) 2 MG tablet, Take 2 mg by mouth as needed for diarrhea or loose stools., Disp: , Rfl:  .  losartan (COZAAR) 50 MG tablet, Take 50 mg by mouth daily., Disp: , Rfl:  .  Melatonin 5 MG TABS, Take 1 tablet by mouth at bedtime as needed (sleep)., Disp: , Rfl:  .  metoprolol (LOPRESSOR) 100 MG tablet, Take 100 mg by mouth 2 (two) times daily., Disp: , Rfl:  .  pantoprazole (PROTONIX) 40 MG tablet, Take 40 mg by mouth daily., Disp: , Rfl:  .  Probiotic Product (ALIGN PO), Take by mouth daily at 2 PM., Disp: , Rfl:  .  SYMBICORT 80-4.5 MCG/ACT inhaler, Take 2 puffs by mouth 2 (two) times daily., Disp: , Rfl:  .  nitroGLYCERIN (NITROSTAT) 0.4 MG SL tablet, Place 1 tablet (0.4 mg total) under the tongue every 5 (five) minutes as needed for chest pain. (Patient not taking: Reported on 08/28/2017), Disp: 25 tablet, Rfl: prn   Vital signs in last 24 hrs: Vitals:   08/28/17 0939  BP: 106/70  Pulse: 80    Physical Exam  Elderly woman, well kept, normal vocal quality, no muscle wasting.  She does not recall having met me before  or our previous conversations.  HEENT: sclera anicteric, oral mucosa moist without lesions  Neck: supple, no thyromegaly, JVD or lymphadenopathy  Cardiac: RRR without murmurs, S1S2 heard, no peripheral edema  Pulm: clear to auscultation bilaterally, normal RR and effort noted  Abdomen: soft, no tenderness, with active bowel sounds. No guarding or palpable hepatosplenomegaly.  Skin; warm and dry, no jaundice or rash    @ASSESSMENTPLANBEGIN @ Assessment: Encounter Diagnoses  Name Primary?  Marland Kitchen Dysphagia, pharyngoesophageal phase Yes  . 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  It sounds like perhaps the symptoms have worsened.  Her daughter had not been present for prior conversations, so I explained the nature of this condition.  It sounds as if the patient's husband is still concerned about her hiatal hernia and whether it should have surgery.  As noted in previous encounters, I reviewed that imaging with him at the time I also told her why I would not advocate surgery for this.  I do not think a hiatal hernia is the major cause of the dysphagia, though it is adding to the tortuosity of the distal esophagus.  I would absolutely not advocate surgery at her age for this.  Moreover, I believe that she has cricopharyngeal dysfunction that I am afraid I cannot make better with endoscopy nor surgery.  This is why I referred him to speech and language pathology in August 2017, that evaluation is on file and show that she had aspiration with thin liquids.  I suspect she is still having episodes of microaspiration causing the mucus production.  The patient's daughter seems unaware of this.  She kept asking her mother if she recall these conversations or previous recommendations from speech and language pathology.  However, I think Cassandra Ray his memory difficulty precludes that.    Plan: I have sent them for reevaluation by speech and language pathology.  I am not certain she needs another modified barium study, or perhaps a bedside swallow evaluation might help give advice on certain consistencies and/or swallowing techniques that can then be reviewed with the patient's family and even given in written form to the dining room staff at [facility].  We will continue her pancreatic enzyme supplements.  Although it was unclear whether the low fecal elastase was due to any previous pancreatic disease, or whether it was just age-related decline in necrotic enzyme production, she definitely had improvement in diarrhea on the supplementation.  She can continue to  take as needed Imodium as well.   Total time 25 minutes, over half spent in counseling and coordination of care.   Cassandra Ray III

## 2017-08-30 ENCOUNTER — Telehealth: Payer: Self-pay

## 2017-08-30 ENCOUNTER — Other Ambulatory Visit: Payer: Self-pay

## 2017-08-30 DIAGNOSIS — R1314 Dysphagia, pharyngoesophageal phase: Secondary | ICD-10-CM

## 2017-08-30 NOTE — Telephone Encounter (Signed)
Internal referral has been sent to Neuro rehab, for speech and language for pharynesophageal dysphagia. Will await appointment information.

## 2017-08-31 ENCOUNTER — Telehealth: Payer: Self-pay | Admitting: Gastroenterology

## 2017-08-31 ENCOUNTER — Encounter: Payer: Self-pay | Admitting: Gastroenterology

## 2017-08-31 NOTE — Telephone Encounter (Signed)
Routed to Toni. 

## 2017-09-01 ENCOUNTER — Encounter: Payer: Self-pay | Admitting: Gastroenterology

## 2017-09-03 NOTE — Telephone Encounter (Signed)
Referral was placed to neuro rehab. Contact info was given to Mr Rizzolo to check on the status.

## 2017-09-03 NOTE — Telephone Encounter (Signed)
Referral was placed on 08-30-2017. They will reach out to the patient to schedule. Contact info was given to Mr Daley to call and make the appointment.

## 2017-09-03 NOTE — Telephone Encounter (Signed)
Patient husband calling back about this. Seems nurse Almyra Free keeps routing it to you. Please advise.

## 2017-09-03 NOTE — Telephone Encounter (Signed)
Appointment has been made for April 25 th at 845 am with an 830 am arrival time. Cassandra Ray has been made aware. Speech path requests a modified barium swallow within 6 months, they scheduled her and will have her chart reviewed. Will let us know if they need to have her scheduled.

## 2017-09-06 ENCOUNTER — Ambulatory Visit: Payer: Medicare Other | Attending: Gastroenterology | Admitting: Speech Pathology

## 2017-09-06 ENCOUNTER — Other Ambulatory Visit: Payer: Self-pay

## 2017-09-06 ENCOUNTER — Encounter: Payer: Self-pay | Admitting: Speech Pathology

## 2017-09-06 DIAGNOSIS — R1313 Dysphagia, pharyngeal phase: Secondary | ICD-10-CM

## 2017-09-06 NOTE — Patient Instructions (Addendum)
   Start your meals with a warm liquid and sip on that during your meals  Follow reflux precautions  Avoid tight belts and waists   Georgiann Hahn SLP  Modified Barium Swallow - Focuses on larynx and pharynx   Esophagram - Focuses on esophagus and the motility of the esophagus

## 2017-09-06 NOTE — Therapy (Signed)
[facility] [address] [place], [place], [address] Phone: [phone]   Fax:  [phone]  Speech Language Pathology Evaluation  Patient Details  Name: Cassandra Ray MRN: [id number] Date of Birth: 12/17/1922 Referring Provider: Wray Kearns, MD   Encounter Date: 09/06/2017  End of Session - 09/06/17 1219    Visit Number  1    Number of Visits  17    Date for SLP Re-Evaluation  11/01/17    Authorization Type  Will change re-eval date according to when pt starts therapy    SLP Start Time  0844    SLP Stop Time   0930    SLP Time Calculation (min)  46 min       Past Medical History:  Diagnosis Date  . Actinic keratoses   . Arthritis    back   . Atrial fibrillation ([place])    a. questionable episode in 2015 in [facility]. b. event monitor through December/January 2014/2015 which showed no evidence of atrial fibrillation.  . Breast cancer ([place])    Breast cancer (right)  . Chronic diastolic CHF (congestive heart failure) ([place])   . Compression fracture of L3 lumbar vertebra   . GERD (gastroesophageal reflux disease)    takes otc Pepcid as needed  . Hiatal hernia 11/27/2014  . HTN (hypertension)   . Hypothyroidism   . Insomnia   . Osteopenia     Past Surgical History:  Procedure Laterality Date  . BREAST LUMPECTOMY Right 1995   lumpectomy  . COLONOSCOPY    . EYE SURGERY Bilateral    cataract w/ lens implant  . KYPHOPLASTY N/A 09/18/2013   Procedure: KYPHOPLASTY;  Surgeon: Sinclair Ship, MD;  Location: [facility];  Service: Orthopedics;  Laterality: N/A;  Lumbar 3 kyphoplasty  . TONSILLECTOMY      There were no vitals filed for this visit.  Subjective Assessment - 09/06/17 1202    Subjective  "When I eat a meal, I start to hiccup and the food backs up and I have to spit it out"    Currently in Pain?  No/denies         SLP Evaluation [facility] - 09/06/17 0001      SLP Visit Information   SLP Received  On  09/06/17    Referring Provider  Wilfrid Lund III, MD    Onset Date  2017    Medical Diagnosis  Pharyngeal dysphagia      Subjective   Patient/Family Stated Goal  To swallow better      Pain Assessment   Currently in Pain?  No/denies      General Information   HPI  Cassandra Ray is 84 y.o. female with h/o pharyngeal dysphagia. Known to [place] from [facility] 01/2016. PMH+ tortuous esophagus (per MD note), hiatal hernia, MBSS 01/2016 revealed mild pharyngeal dysphagia due to timing, no f/u with ST was recommended. Regular thin with no straws, meds with puree Trace silent aspiration with straws/thin and large successive thin cup sips. Dr. Domingo Mend has explained to the pt and family that known aspiration and micro aspiration is likely causing her mucus.     Mobility Status  walker      Balance Screen   Has the patient fallen in the past 6 months  No    Has the patient had a decrease in activity level because of a fear of falling?   No    Is the patient reluctant to leave their home because of  a fear of falling?   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 female with h/o pharyngeal dysphagia. Known to [place] from [facility] 01/2016. PMH+ tortuous esophagus (per MD note), hiatal hernia, MBSS 01/2016 revealed mild pharyngeal dysphagia due to timing, with silent and sensed aspiration of large successive sips and straw sips of thin  liquids.No f/u with ST was recommended. Regular thin with no straws, meds with puree, small liquid cup sips. Per MBSS suspect esophageal dysphagia also. Dr. Loletha Carrow explained to pt and family mucus is likely result of microaspiration        Thin Liquid - 09/06/17 1216      Thin Liquid   Thin Liquid  Impaired    Presentation  Cup    Pharyngeal  Phase Impairments  Cough - Delayed    Other Comments  tearing, nose running, change in voice quality         Solid - 09/06/17 1218      Solid   Solid  Impaired    Presentation  Self Fed    Pharyngeal Phase Impairments  Cough - Delayed;Other (comments)    Other Comments  tearing, nose running, change in voice quality            SLP Short Term Goals - 09/06/17 1310      SLP SHORT TERM GOAL #1   Title  Pt will follow diet modifications with rare min A over 2 sessions    Baseline  if  diet changes are indicated per MBSS    Time  4    Period  Weeks    Status  New      SLP SHORT TERM GOAL #2   Title  Pt will complete HEP for dysphagia with occasional min A    Baseline  pending recommendations from MBSS if HEP is indicated    Time  4    Period  Weeks    Status  New      SLP SHORT TERM GOAL #3   Title  Pt/spouse will verbalize s/s of aspiration pna with rare min A over 2 sessions    Time  4    Period  Weeks    Status  New       SLP Long Term Goals - 09/06/17 1312      SLP LONG TERM GOAL #1   Title  Pt will perform HEP for dysphagia with mod I    Baseline  Pending if HEP is indicated per repeat MBSS    Time  8    Period  Weeks    Status  New      SLP LONG TERM GOAL #2   Title  Pt will follow diet modifications and swallow protocol with mod  I over 2 sessions    Time  8    Period  Weeks    Status  New       Plan - 09/06/17 1458    Clinical Impression Statement  Cassandra Ray is referred by GI due to c/o of dysphagia. She is accompanied by her husband and daughter. Prior MBS see HPI. They report significant coughing  with meals, frequent hiccups/belching with meals. Pt states food "gets backed up and I have to spit it out." She also reports significant mucus and wheezing at night. Pt and family report that her dysphagia has gotten significantly worse since her prior [facility] 01/2016. They deny fevers, pna.  Clinical swallow evaluation revealed overt s/s of aspiration, with tearing, rhinorrhea and changes in voice after liquds. Delayed cough after solids. As dysphagia is significantly worse since prior MBSS, has h/o silent aspiration and neither exercises, diet modifications nor posture changes were indicated on last MBSS, I recommend repeat MBSS prior to starting speech therapy. Will recommend skilled ST if indicated on repeat MBSS. Pt to continue current diet, no straws and meds in puree unitl MBS.      Patient will benefit from skilled therapeutic intervention in order to improve the following deficits and impairments:   Dysphagia, pharyngeal phase - Plan: SLP modified barium swallow    Problem List Patient Active Problem List   Diagnosis Date Noted  . Bilateral impacted cerumen 11/22/2016  . Presbycusis of both ears 11/22/2016  . Pleural effusion, right 02/08/2016  . Acute on chronic diastolic CHF (congestive heart failure) ([place]) 02/08/2016  . Lower extremity edema 01/31/2016  . Chronic diastolic heart failure ([place]) 02/03/2015  . Hyponatremia 04/25/2014  . SOB (shortness of breath)   . Hypertensive urgency 04/24/2014  . Compression fracture 09/18/2013  . Knee pain 08/11/2013  . A-fib ([place]) 05/09/2013  . Back pain 05/09/2013  . HTN (hypertension)   . Hypothyroidism     Olimpia Tinch, Annye Rusk MS, CCC-SLP 09/06/2017, 3:06 PM  [facility] [address] [place], [place], [address] Phone: [phone]   Fax:  [phone]  Name: NAILYN DEARINGER MRN: [id number] Date of Birth: 1922/10/03

## 2017-09-07 ENCOUNTER — Other Ambulatory Visit (HOSPITAL_COMMUNITY): Payer: Self-pay | Admitting: Gastroenterology

## 2017-09-07 DIAGNOSIS — R1319 Other dysphagia: Secondary | ICD-10-CM

## 2017-09-11 ENCOUNTER — Other Ambulatory Visit: Payer: Self-pay | Admitting: Gastroenterology

## 2017-09-14 ENCOUNTER — Ambulatory Visit (HOSPITAL_COMMUNITY)
Admission: RE | Admit: 2017-09-14 | Discharge: 2017-09-14 | Disposition: A | Discharge status: Home / Self Care | Payer: Medicare Other | Source: Ambulatory Visit | Attending: Gastroenterology | Admitting: Gastroenterology

## 2017-09-14 DIAGNOSIS — R1313 Dysphagia, pharyngeal phase: Secondary | ICD-10-CM

## 2017-09-14 DIAGNOSIS — R1319 Other dysphagia: Secondary | ICD-10-CM

## 2017-09-14 DIAGNOSIS — K449 Diaphragmatic hernia without obstruction or gangrene: Secondary | ICD-10-CM | POA: Diagnosis not present

## 2017-09-14 IMAGING — RF DG SWALLOWING FUNCTION
10 series · 24 of 24 positions shown · non-contrast
Comparison: None.

CLINICAL DATA: Dysphagia

EXAM:
MODIFIED BARIUM SWALLOW
TECHNIQUE: Different consistencies of barium were administered orally to the
patient by the Speech Pathologist. Imaging of the pharynx was
performed in the lateral projection.
FLUOROSCOPY TIME:  Fluoroscopy Time:  1 minutes 12 seconds
Radiation Exposure Index (if provided by the fluoroscopic device):
8.1 mGy

[Series 1: cp_standard · 0.34mm/px · 3 of 59 frames shown (1 of 10)]
[frame 9/59]
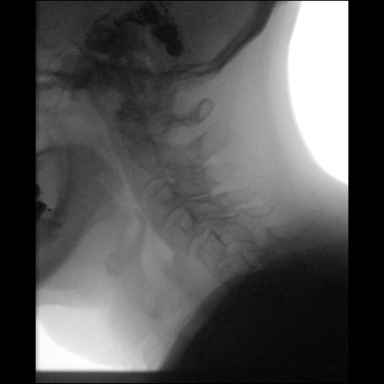
[frame 51/59]
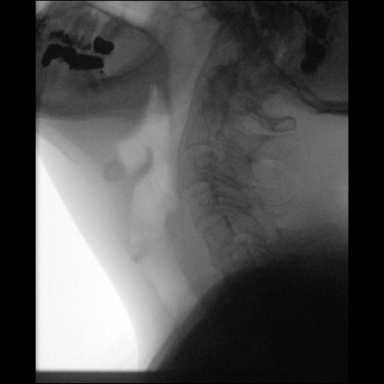
[frame 58/59]
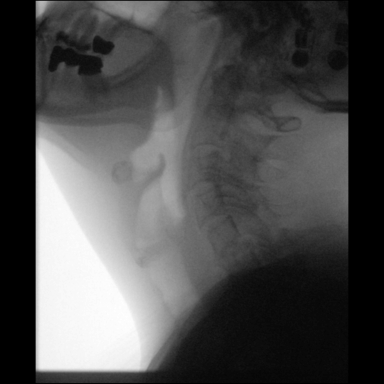

[Series 2: cp_standard · 0.34mm/px · 1 of 48 frames shown (2 of 10)]
[frame 25/48]
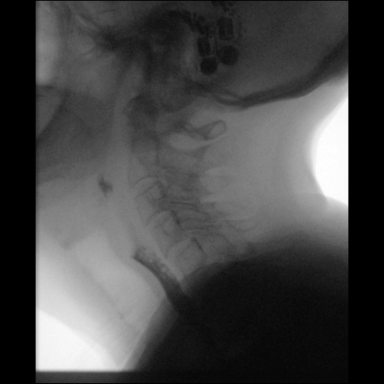

[Series 3: cp_standard · 0.34mm/px · 3 of 90 frames shown (3 of 10)]
[frame 14/90]
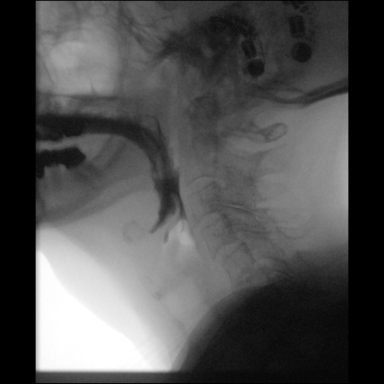
[frame 38/90]
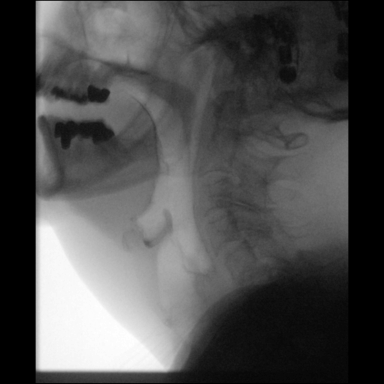
[frame 77/90]
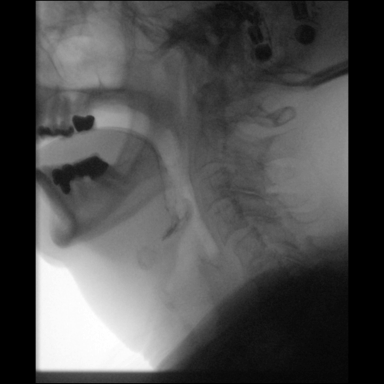

[Series 4: cp_standard · 0.34mm/px · 2 of 49 frames shown (4 of 10)]
[frame 25/49]
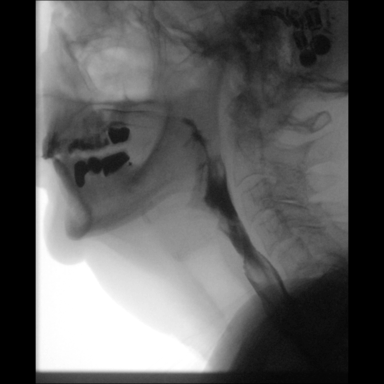
[frame 35/49]
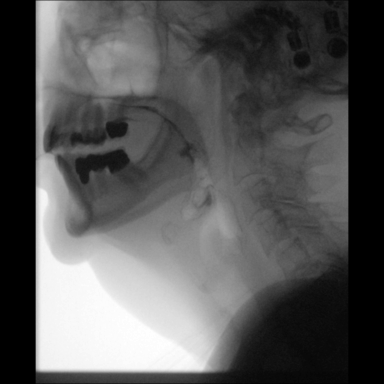

[Series 5: cp_standard · 0.34mm/px · 3 of 49 frames shown (5 of 10)]
[frame 8/49]
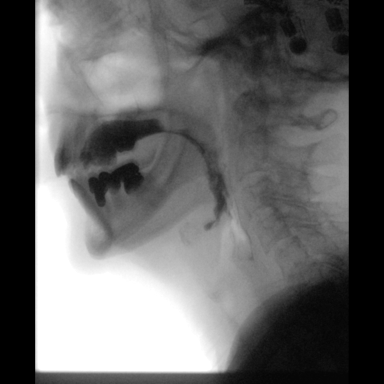
[frame 30/49]
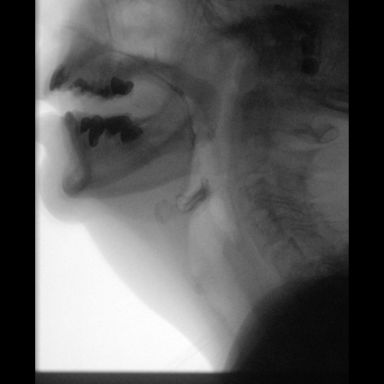
[frame 42/49]
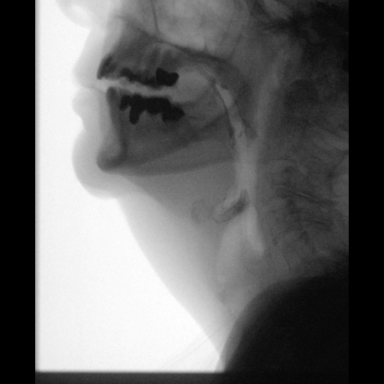

[Series 6: cp_standard · 0.34mm/px · 2 of 89 frames shown (6 of 10)]
[frame 45/89]
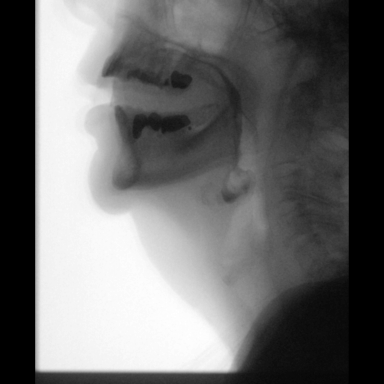
[frame 54/89]
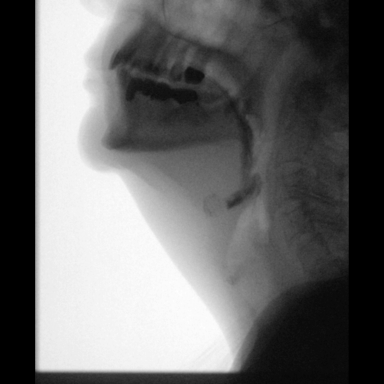

[Series 7: cp_standard · 0.34mm/px · 3 of 195 frames shown (7 of 10)]
[frame 30/195]
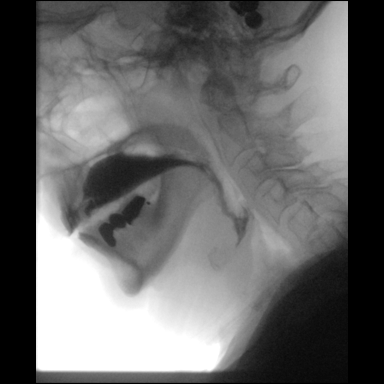
[frame 157/195]
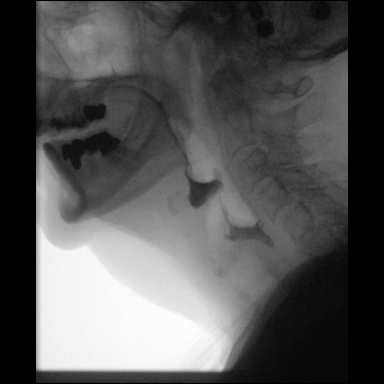
[frame 166/195]
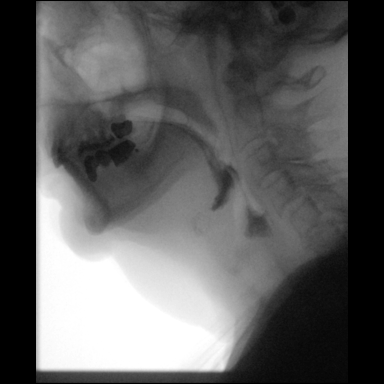

[Series 8: cp_standard · 0.35mm/px · 2 of 176 frames shown (8 of 10)]
[frame 27/176]
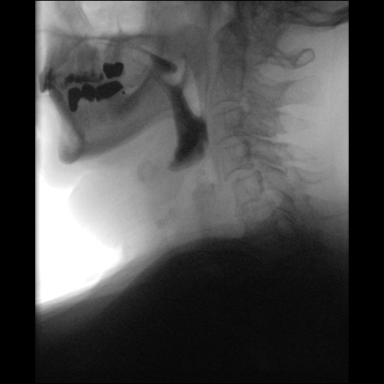
[frame 150/176]
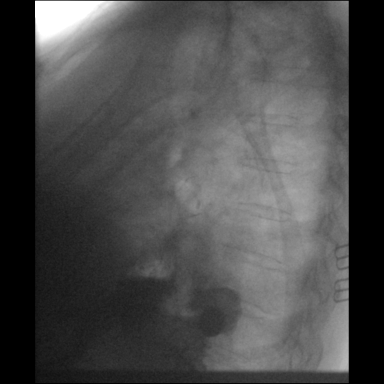

[Series 9: cp_standard · 0.35mm/px · 2 of 50 frames shown (9 of 10)]
[frame 8/50]
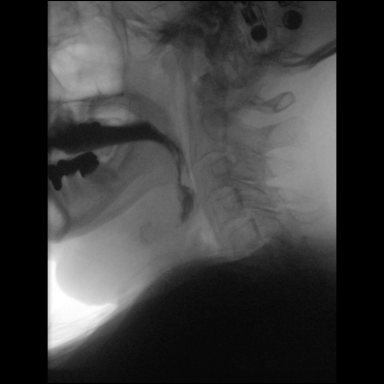
[frame 43/50]
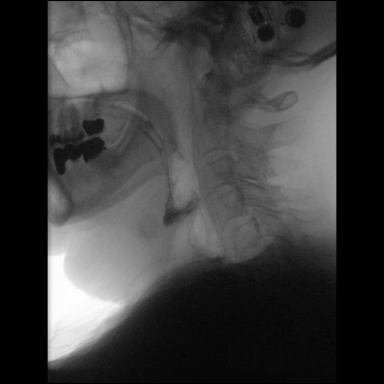

[Series 10: cp_standard · 0.35mm/px · 3 of 66 frames shown (10 of 10)]
[frame 10/66]
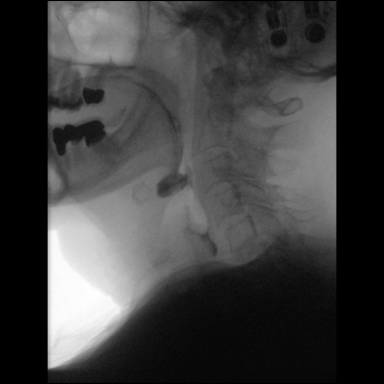
[frame 34/66]
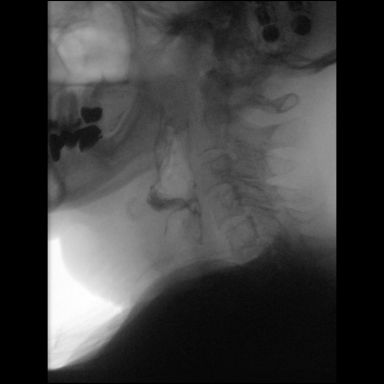
[frame 57/66]
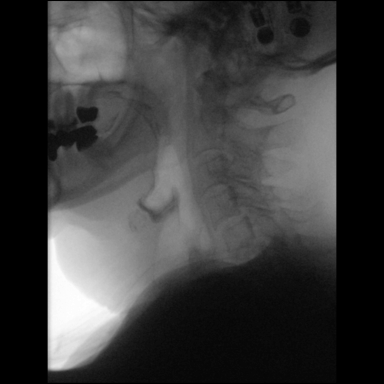

[24 of 24 positions shown; findings below may reference images not displayed]

FINDINGS: Thin liquid-aspiration event occurred on initial oral bolus of thin
consistency material, tolerated better on subsequent swallows with
chin-tuck. Pooling residue persisted after each swallow.

Nectar thick liquid- within normal limits

Pure- within normal limits

Pure with cracker- within normal limits

Barium tablet-passed through the esophagus to the level of a large
hiatal hernia.
IMPRESSION: 1. Aspiration event occurred on initial oral bolus of thin
consistency material, with cough reflex elicited. Thin consistency
boluses were better tolerated with chin-tuck.
2. Pooling residue persisted after the majority of the oral boluses.
3. No obstruction. Barium tablet passed through the esophagus to the
level of the large hiatal hernia.

Please refer to the Speech Pathologists report for complete details
and recommendations.

## 2017-09-18 ENCOUNTER — Ambulatory Visit: Payer: Medicare Other | Admitting: Physician Assistant

## 2017-09-27 ENCOUNTER — Ambulatory Visit: Payer: Medicare Other | Admitting: Speech Pathology

## 2017-10-02 ENCOUNTER — Ambulatory Visit: Payer: Medicare Other | Admitting: Speech Pathology

## 2017-10-02 ENCOUNTER — Encounter: Payer: Self-pay | Admitting: Speech Pathology

## 2017-10-02 DIAGNOSIS — R1313 Dysphagia, pharyngeal phase: Secondary | ICD-10-CM

## 2017-10-02 NOTE — Therapy (Signed)
[facility] [address] [place], [place], [address] Phone: [phone]   Fax:  [phone]  Speech Language Pathology Treatment  Patient Details  Name: Cassandra Ray MRN: [id number] Date of Birth: Oct 30, 1922 Referring Provider: Wray Kearns, MD   Encounter Date: 10/02/2017  End of Session - 10/02/17 1517    Visit Number  2    Number of Visits  17    Date for SLP Re-Evaluation  11/01/17    Authorization Type  Will change re-eval date according to when pt starts therapy    SLP Start Time  1317    SLP Stop Time   1400    SLP Time Calculation (min)  43 min    Activity Tolerance  Patient tolerated treatment well       Past Medical History:  Diagnosis Date  . Actinic keratoses   . Arthritis    back   . Atrial fibrillation ([place])    a. questionable episode in 2015 in [facility]. b. event monitor through December/January 2014/2015 which showed no evidence of atrial fibrillation.  . Breast cancer ([place])    Breast cancer (right)  . Chronic diastolic CHF (congestive heart failure) ([place])   . Compression fracture of L3 lumbar vertebra   . GERD (gastroesophageal reflux disease)    takes otc Pepcid as needed  . Hiatal hernia 11/27/2014  . HTN (hypertension)   . Hypothyroidism   . Insomnia   . Osteopenia     Past Surgical History:  Procedure Laterality Date  . BREAST LUMPECTOMY Right 1995   lumpectomy  . COLONOSCOPY    . EYE SURGERY Bilateral    cataract w/ lens implant  . KYPHOPLASTY N/A 09/18/2013   Procedure: KYPHOPLASTY;  Surgeon: Sinclair Ship, MD;  Location: [facility];  Service: Orthopedics;  Laterality: N/A;  Lumbar 3 kyphoplasty  . TONSILLECTOMY      There were no vitals filed for this visit.  Subjective Assessment - 10/02/17 1325    Subjective  "I've been taking warm liquids with my lunch and dinner"    Currently in Pain?  No/denies            ADULT SLP TREATMENT - 10/02/17 1325      General Information   Behavior/Cognition  Alert;Cooperative;Pleasant mood      Treatment Provided   Treatment provided  Dysphagia      Dysphagia Treatment   Temperature Spikes Noted  No    Respiratory Status  Room air    Oral Cavity - Dentition  Adequate natural dentition    Treatment Methods  Skilled observation;Therapeutic exercise;Compensation strategy training;Patient/caregiver education    Patient observed directly with PO's  Yes    Type of PO's observed  Thin liquids    Feeding  Able to feed self    Liquids provided via  Cup    Pharyngeal Phase Signs & Symptoms  Wet vocal quality;Delayed cough;Immediate cough;Watery eyes    Type of cueing  Verbal;Visual    Amount of cueing  Moderate    Other treatment/comments  Initiated trianing in HEP for dysphagia - tongue base retraction, epiglottic inversion per MBSS. Pt required ongoing mod A to complete the exercises. Her daughter and spouse were also present and provided notes on how to complete the HEP. Daughter took notes re: cueing. Trained pt in double swallow and chin tuck with liquids - she required ongoing cueing, however she was able to successfully complete double swallows with chin tuck. Pt and family  affirm pt is using applesauce with her meds.       Pain Assessment   Pain Assessment  No/denies pain      Dysphagia Recommendations   Diet recommendations  Dysphagia 3 (mechanical soft);Thin liquid    Liquids provided via  Cup    Medication Administration  Whole meds with puree    Supervision  Patient able to self feed;Intermittent supervision to cue for compensatory strategies    Compensations  Slow rate;Small sips/bites;Multiple dry swallows after each bite/sip;Clear throat intermittently;Effortful swallow    Postural Changes and/or Swallow Maneuvers  Chin tuck      Progression Toward Goals   Progression toward goals  Progressing toward goals         SLP Short Term Goals - 10/02/17 1515      SLP SHORT TERM GOAL #1    Title  Pt will follow diet modifications with rare min A over 2 sessions    Time  4    Period  Weeks    Status  On-going      SLP SHORT TERM GOAL #2   Title  Pt will complete HEP for dysphagia with occasional min A    Time  4    Period  Weeks    Status  On-going      SLP SHORT TERM GOAL #3   Title  Pt/spouse will verbalize s/s of aspiration pna with rare min A over 2 sessions    Time  4    Period  Weeks    Status  On-going       SLP Long Term Goals - 10/02/17 1515      SLP LONG TERM GOAL #1   Title  Pt will perform HEP for dysphagia with mod I    Time  8    Period  Weeks    Status  New      SLP LONG TERM GOAL #2   Title  Pt will follow diet modifications and swallow protocol with mod I over 2 sessions    Time  8    Period  Weeks    Status  On-going       Plan - 10/02/17 1516    Clinical Impression Statement  Pt received repeat MBSS which recommended outpt ST for HEP for tongue base retraction and epiglottic inversion, as well as training in swallow precautions, including double swallow with thin liquids and chin tuck. Initiated training today in HEP and precautions. Pt will need ongoing cues from family for precautions due to reduced memory. Continue skilled ST to reduce risk of aspiration pna.  Per our front desk, pt has elected to start [place] 6/17 due to her schedule constraints.     Treatment/Interventions  Aspiration precaution training;Environmental controls;Cueing hierarchy;Pharyngeal strengthening exercises;Diet toleration management by SLP;Trials of upgraded texture/liquids;Internal/external aids;Patient/family education    Potential to [place]       Patient will benefit from skilled therapeutic intervention in order to improve the following deficits and impairments:   Dysphagia, pharyngeal phase    Problem List Patient Active Problem List   Diagnosis Date Noted  . Bilateral impacted cerumen 11/22/2016  . Presbycusis of both ears 11/22/2016  .  Pleural effusion, right 02/08/2016  . Acute on chronic diastolic CHF (congestive heart failure) ([place]) 02/08/2016  . Lower extremity edema 01/31/2016  . Chronic diastolic heart failure ([place]) 02/03/2015  . Hyponatremia 04/25/2014  . SOB (shortness of breath)   . Hypertensive urgency 04/24/2014  . Compression fracture  09/18/2013  . Knee pain 08/11/2013  . A-fib ([place]) 05/09/2013  . Back pain 05/09/2013  . HTN (hypertension)   . Hypothyroidism     Jernee Murtaugh, Annye Rusk MS, CCC-SLP 10/02/2017, 3:18 PM  [facility] [address] [place], [place], [address] Phone: [phone]   Fax:  [phone]   Name: Cassandra Ray MRN: [id number] Date of Birth: 1922/08/22

## 2017-10-02 NOTE — Patient Instructions (Addendum)
  SWALLOWING EXERCISES 1. Effortful Swallows - Squeeze hard with the muscles in your neck while you swallow your  saliva or a sip of water - Repeat 20 times, 2-3 times a day, and use whenever you eat or drink  2. [place]ako Swallow - swallow with your tongue sticking out - Stick tongue out and gently bite tongue with your teeth - Swallow, while holding your tongue with your teeth - Repeat 10 times, 2-3 times a day - do not take a drink with this. You can drink in between these, but not while doing them  3. Pitch Raise - Repeat "he", once per second in as high of a pitch as you can - Repeat 20 times, 2-3 times a day  4. Shaker Exercise - head lift - Lie flat on your back in your bed or on a couch without pillows - Raise your head and look at your feet  - KEEP YOUR SHOULDERS DOWN - HOLD FOR 45 SECONDS, then lower your head back down - Repeat 3 times, 2-3 times a day  5. Mendelsohn Maneuver - "half swallow" exercise - Start to swallow, and keep your Adam's apple up by squeezing hard with the muscles of the throat - Hold the squeeze for 5-7 seconds and then relax - Repeat 20 times, 2-3 times a day  6. Tongue Press - Press your entire tongue as hard as you can against the roof of your mouth for 3-5 seconds - Repeat 20 times, 2-3 times a day  7. Tongue Stretch/Teeth Clean - Move your tongue around the pocket between your gums and teeth, clockwise and then counter-clockwise - Repeat on the back side, clockwise and then counter-clockwise - Repeat 15-20 times, 2-3 times a day  8. Breath Hold - Say "HUH!" loudly, holding your breath tightly at the level of your voice box for 3 seconds - Repeat 20 times, 2-3 times a day  9. Chin pushback - Open your mouth  - Place your fist UNDER your chin near your neck, and push back with your fist for 5 seconds  - Repeat 20 times, 2-3 times a day     10. Open mouth swallow          -Swallow with your mouth open wide          -Repeat 15 times, 2  times a day  11. Stick out your tongue and say "GA-GA-GA" loud and shart           -Repeat 25 sets of 3, 2-3 times a day  12. Say ING-GA loud and exaggerated - get a good "GA" sound             - 25 times 2-3 times a day  13. Gargle or pretend to gargle             - 10 times for 3-5 seconds (or as long as you can) 2 times a day  14. Scrape the roof of your mouth starting behind your teeth, back to your throat 10x 2-3 times a day  Chin tuck with all swallows  2 swallows for liquids  Cough or "Hock" intermittently to clear our any residue in your throat  Pills in applesauce, yogurt, pudding

## 2017-10-11 ENCOUNTER — Other Ambulatory Visit: Payer: Self-pay | Admitting: Gastroenterology

## 2017-11-08 ENCOUNTER — Other Ambulatory Visit: Payer: Self-pay | Admitting: Gastroenterology

## 2017-11-16 ENCOUNTER — Encounter

## 2017-11-19 ENCOUNTER — Ambulatory Visit: Payer: Medicare Other | Attending: Gastroenterology | Admitting: Speech Pathology

## 2017-11-19 DIAGNOSIS — R1313 Dysphagia, pharyngeal phase: Secondary | ICD-10-CM | POA: Insufficient documentation

## 2017-11-19 NOTE — Therapy (Signed)
[employer] [address] [place], [place], [address] Phone: [phone]   Fax:  [phone]  Speech Language Pathology Treatment  Patient Details  Name: Cassandra Ray MRN: [id number] Date of Birth: 1923/02/25 Referring Provider: Wray Kearns, MD   Encounter Date: 11/19/2017  End of Session - 11/19/17 1816    Visit Number  3    Number of Visits  17    Date for SLP Re-Evaluation  12/14/17 re-eval date changed as pt just resumed therapy after break    SLP Start Time  1402    SLP Stop Time   1444    SLP Time Calculation (min)  42 min    Activity Tolerance  Patient tolerated treatment well       Past Medical History:  Diagnosis Date  . Actinic keratoses   . Arthritis    back   . Atrial fibrillation ([place])    a. questionable episode in 2015 in [facility]. b. event monitor through December/January 2014/2015 which showed no evidence of atrial fibrillation.  . Breast cancer ([place])    Breast cancer (right)  . Chronic diastolic CHF (congestive heart failure) ([place])   . Compression fracture of L3 lumbar vertebra   . GERD (gastroesophageal reflux disease)    takes otc Pepcid as needed  . Hiatal hernia 11/27/2014  . HTN (hypertension)   . Hypothyroidism   . Insomnia   . Osteopenia     Past Surgical History:  Procedure Laterality Date  . BREAST LUMPECTOMY Right 1995   lumpectomy  . COLONOSCOPY    . EYE SURGERY Bilateral    cataract w/ lens implant  . KYPHOPLASTY N/A 09/18/2013   Procedure: KYPHOPLASTY;  Surgeon: Sinclair Ship, MD;  Location: [place];  Service: Orthopedics;  Laterality: N/A;  Lumbar 3 kyphoplasty  . TONSILLECTOMY      There were no vitals filed for this visit.  Subjective Assessment - 11/19/17 1406    Subjective  "I've only had one episode."    Currently in Pain?  No/denies            ADULT SLP TREATMENT - 11/19/17 1402      General Information   Behavior/Cognition   Alert;Cooperative;Pleasant mood      Treatment Provided   Treatment provided  Dysphagia      Dysphagia Treatment   Temperature Spikes Noted  No    Respiratory Status  Room air    Oral Cavity - Dentition  Adequate natural dentition    Treatment Methods  Skilled observation;Compensation strategy training;Patient/caregiver education;Differential diagnosis    Patient observed directly with PO's  Yes    Type of PO's observed  Thin liquids;Dysphagia 3 (soft)    Feeding  Able to feed self    Liquids provided via  Cup    Oral Phase Signs & Symptoms  Prolonged mastication;Other (comment) mild oral residue with soft solid    Pharyngeal Phase Signs & Symptoms  Wet vocal quality;Immediate cough    Type of cueing  Verbal    Amount of cueing  Minimal    Other treatment/comments  Pt arrived for ST treatment, which was postponed for nearly 2 months due to scheduling conflict. Pt required mod question cues for swallow precautions. She reports she is "trying" to tuck her chin when swallowing but admits she is not doing so consistently. SLP used diagram to explain pt's swallowing impairment and rationale for precautions and to answer pt's questions about her swallowing. Provided written handout  of precautions; during therapeutic trials pt required occasional verbal cues for timing of chin tuck and to refrain from tossing her head back in what appeared to be an effort to aid oral transit. Faded cues to supervision. No overt signs of aspiration, however change in resonance with solids due to suspected vallecular residue. Hock, effortful swallow returned vocal quality to baseline. With larger cup sip, pt had an immediate cough, suggestive of reduced airway protection. Pt admitted "that should have been 2 sips." Reinforced limiting to small bites, sips.       Pain Assessment   Pain Assessment  No/denies pain      Assessment / Recommendations / Plan   Plan  Continue with current plan of care      Dysphagia  Recommendations   Diet recommendations  Dysphagia 3 (mechanical soft);Thin liquid    Liquids provided via  Cup;Straw    Medication Administration  Whole meds with puree    Supervision  Patient able to self feed;Intermittent supervision to cue for compensatory strategies    Compensations  Slow rate;Small sips/bites;Multiple dry swallows after each bite/sip;Clear throat intermittently;Effortful swallow    Postural Changes and/or Swallow Maneuvers  Chin tuck      Progression Toward Goals   Progression toward goals  Progressing toward goals       SLP Education - 11/19/17 1816    Education Details  swallow physiology, rationale for compensatory strategies, signs of aspiration pneumonia    Person(s) Educated  Patient    Methods  Explanation;Demonstration;Handout    Comprehension  Verbalized understanding;Need further instruction;Returned demonstration;Verbal cues required       SLP Short Term Goals - 11/19/17 1820      SLP SHORT TERM GOAL #1   Title  Pt will follow diet modifications with rare min A over 2 sessions    Time  3    Period  Weeks    Status  On-going      SLP SHORT TERM GOAL #2   Title  Pt will complete HEP for dysphagia with occasional min A    Time  3    Period  Weeks    Status  On-going      SLP SHORT TERM GOAL #3   Title  Pt/spouse will verbalize s/s of aspiration pna with rare min A over 2 sessions    Time  3    Period  Weeks    Status  On-going       SLP Long Term Goals - 11/19/17 1821      SLP LONG TERM GOAL #1   Title  Pt will perform HEP for dysphagia with mod I    Time  7    Period  Weeks    Status  On-going      SLP LONG TERM GOAL #2   Title  Pt will follow diet modifications and swallow protocol with mod I over 2 sessions    Time  7    Period  Weeks    Status  On-going       Plan - 11/19/17 1817    Clinical Impression Statement  Pt arrived alone to ST today. Continued training today in swallow precautions; pt recalled very little of her  prior visits or recommendations. Pt will need ongoing cues from family for precautions due to reduced memory. Plan to review exercises with pt next session; would benefit from family participation in session to improve carryover at home. Continue skilled ST to reduce risk of aspiration pna.  Speech Therapy Frequency  2x / week    Treatment/Interventions  Aspiration precaution training;Environmental controls;Cueing hierarchy;Pharyngeal strengthening exercises;Diet toleration management by SLP;Trials of upgraded texture/liquids;Internal/external aids;Patient/family education    Potential to Achieve Goals  Fair    Potential Considerations  Ability to learn/carryover information;Cooperation/participation level    Consulted and Agree with Plan of Care  Patient       Patient will benefit from skilled therapeutic intervention in order to improve the following deficits and impairments:   Dysphagia, pharyngeal phase    Problem List Patient Active Problem List   Diagnosis Date Noted  . Bilateral impacted cerumen 11/22/2016  . Presbycusis of both ears 11/22/2016  . Pleural effusion, right 02/08/2016  . Acute on chronic diastolic CHF (congestive heart failure) ([place]) 02/08/2016  . Lower extremity edema 01/31/2016  . Chronic diastolic heart failure ([place]) 02/03/2015  . Hyponatremia 04/25/2014  . SOB (shortness of breath)   . Hypertensive urgency 04/24/2014  . Compression fracture 09/18/2013  . Knee pain 08/11/2013  . A-fib ([place]) 05/09/2013  . Back pain 05/09/2013  . HTN (hypertension)   . Hypothyroidism    Deneise Lever, [place], [place] 11/19/2017, 6:21 PM  [facility] [address] [place], [place], [address] Phone: [phone]   Fax:  [phone]   Name: Cassandra Ray MRN: [id number] Date of Birth: 08-12-1922

## 2017-11-19 NOTE — Patient Instructions (Signed)
Swallowing precautions: do these whenever you eat or drink to reduce the risk of aspiration (food or liquid going down the "wrong pipe"). Aspiration can cause pneumonia.    Chin tuck with all swallows   2 swallows for liquids  Take small bites and sips   Cough or "Hock" intermittently to clear our any residue in your throat   Pills in applesauce, yogurt, pudding   Continue your exercises per handout given last time. We will review them at your next visit.   Signs of Aspiration Pneumonia   . Chest pain/tightness . Fever (can be low grade) . Cough  o With foul-smelling phlegm (sputum) o With sputum containing pus or blood o With greenish sputum . Fatigue  . Shortness of breath  . Wheezing   **IF YOU HAVE THESE SIGNS, CONTACT YOUR DOCTOR OR GO TO THE EMERGENCY DEPARTMENT OR URGENT CARE AS SOON AS POSSIBLE**

## 2017-11-22 ENCOUNTER — Ambulatory Visit: Payer: Medicare Other | Admitting: Speech Pathology

## 2017-11-22 DIAGNOSIS — R1313 Dysphagia, pharyngeal phase: Secondary | ICD-10-CM

## 2017-11-22 NOTE — Patient Instructions (Signed)
  Mixed consistency food (food with solid and a liquid) such as cereal, soups, fruit cocktail are harder to manage - chin down, 2 swallows  This week focus on doing your exercises more regularly  - you are doing them accurately  Stay upright after you eat 60 minutes

## 2017-11-22 NOTE — Therapy (Signed)
[facility] [address] [place], [place], [address] Phone: [phone]   Fax:  [phone]  Speech Language Pathology Treatment  Patient Details  Name: Cassandra Ray MRN: [id number] Date of Birth: May 02, 1923 Referring Provider: Wray Kearns, MD   Encounter Date: 11/22/2017  End of Session - 11/22/17 1444    Visit Number  4    Number of Visits  17    Date for SLP Re-Evaluation  12/14/17    SLP Start Time  90    SLP Stop Time   1433    SLP Time Calculation (min)  31 min    Activity Tolerance  Patient tolerated treatment well       Past Medical History:  Diagnosis Date  . Actinic keratoses   . Arthritis    back   . Atrial fibrillation ([place])    a. questionable episode in 2015 in [facility]. b. event monitor through December/January 2014/2015 which showed no evidence of atrial fibrillation.  . Breast cancer ([place])    Breast cancer (right)  . Chronic diastolic CHF (congestive heart failure) ([place])   . Compression fracture of L3 lumbar vertebra   . GERD (gastroesophageal reflux disease)    takes otc Pepcid as needed  . Hiatal hernia 11/27/2014  . HTN (hypertension)   . Hypothyroidism   . Insomnia   . Osteopenia     Past Surgical History:  Procedure Laterality Date  . BREAST LUMPECTOMY Right 1995   lumpectomy  . COLONOSCOPY    . EYE SURGERY Bilateral    cataract w/ lens implant  . KYPHOPLASTY N/A 09/18/2013   Procedure: KYPHOPLASTY;  Surgeon: Sinclair Ship, MD;  Location: [facility];  Service: Orthopedics;  Laterality: N/A;  Lumbar 3 kyphoplasty  . TONSILLECTOMY      There were no vitals filed for this visit.  Subjective Assessment - 11/22/17 1417    Subjective  "I can put my chin down at breakfast and lunch, it's hard in the dining room - it looks funny"    Currently in Pain?  No/denies            ADULT SLP TREATMENT - 11/22/17 1418      General Information   Behavior/Cognition   Alert;Cooperative;Pleasant mood      Treatment Provided   Treatment provided  Dysphagia      Dysphagia Treatment   Temperature Spikes Noted  No    Respiratory Status  Room air    Oral Cavity - Dentition  Adequate natural dentition    Treatment Methods  Skilled observation;Compensation strategy training;Patient/caregiver education;Differential diagnosis    Patient observed directly with PO's  Yes    Type of PO's observed  Thin liquids;Dysphagia 3 (soft)    Liquids provided via  Cup    Pharyngeal Phase Signs & Symptoms  Watery eyes;Wet vocal quality    Type of cueing  Verbal    Amount of cueing  Minimal    Other treatment/comments  HEP for dysphagia completed with rare min A - pt reports inconsistent completion of HEP - her goal this week is to do exercises more regularly. Pt verbalized and demonstrated swallow precautions with rare min A. She reports good follow up with these at lunch and breakfast, with some distractions at dinner as they go to the main dining room.       Pain Assessment   Pain Assessment  No/denies pain      Assessment / Recommendations / Plan   Plan  Continue with current plan of care      Dysphagia Recommendations   Diet recommendations  Dysphagia 3 (mechanical soft);Thin liquid    Liquids provided via  Cup;Straw    Medication Administration  Whole meds with puree    Supervision  Patient able to self feed;Intermittent supervision to cue for compensatory strategies    Compensations  Slow rate;Small sips/bites;Multiple dry swallows after each bite/sip;Clear throat intermittently;Effortful swallow    Postural Changes and/or Swallow Maneuvers  Upright 30-60 min after meal;Chin tuck;Seated upright 90 degrees      Progression Toward Goals   Progression toward goals  Progressing toward goals       SLP Education - 11/22/17 1442    Education Details  avoid mixed consistencies; reflux precautions    Person(s) Educated  Patient    Methods   Explanation;Demonstration;Handout    Comprehension  Verbalized understanding;Returned demonstration       SLP Short Term Goals - 11/22/17 1443      SLP SHORT TERM GOAL #1   Title  Pt will follow diet modifications with rare min A over 2 sessions    Baseline  11/22/17    Time  3    Period  Weeks    Status  On-going      SLP SHORT TERM GOAL #2   Title  Pt will complete HEP for dysphagia with occasional min A    Baseline  6/20;19    Time  3    Period  Weeks    Status  On-going      SLP SHORT TERM GOAL #3   Title  Pt/spouse will verbalize s/s of aspiration pna with rare min A over 2 sessions    Time  3    Period  Weeks    Status  On-going       SLP Long Term Goals - 11/22/17 1444      SLP LONG TERM GOAL #1   Title  Pt will perform HEP for dysphagia with mod I    Time  7    Period  Weeks    Status  On-going      SLP LONG TERM GOAL #2   Title  Pt will follow diet modifications and swallow protocol with mod I over 2 sessions    Time  7    Period  Weeks    Status  On-going       Plan - 11/22/17 1443    Clinical Impression Statement  Pt arrived alone to ST today. Continued training today in swallow precautions; pt recalled very little of her prior visits or recommendations. Pt will need ongoing cues from family for precautions due to reduced memory. Plan to review exercises with pt next session; would benefit from family participation in session to improve carryover at home. Continue skilled ST to reduce risk of aspiration pna.     Speech Therapy Frequency  2x / week    Treatment/Interventions  Aspiration precaution training;Environmental controls;Cueing hierarchy;Pharyngeal strengthening exercises;Diet toleration management by SLP;Trials of upgraded texture/liquids;Internal/external aids;Patient/family education    Potential to Achieve Goals  Fair    Potential Considerations  Ability to learn/carryover information;Cooperation/participation level       Patient will benefit  from skilled therapeutic intervention in order to improve the following deficits and impairments:   Dysphagia, pharyngeal phase    Problem List Patient Active Problem List   Diagnosis Date Noted  . Bilateral impacted cerumen 11/22/2016  . Presbycusis of both ears 11/22/2016  . Pleural  effusion, right 02/08/2016  . Acute on chronic diastolic CHF (congestive heart failure) ([place]) 02/08/2016  . Lower extremity edema 01/31/2016  . Chronic diastolic heart failure ([place]) 02/03/2015  . Hyponatremia 04/25/2014  . SOB (shortness of breath)   . Hypertensive urgency 04/24/2014  . Compression fracture 09/18/2013  . Knee pain 08/11/2013  . A-fib ([place]) 05/09/2013  . Back pain 05/09/2013  . HTN (hypertension)   . Hypothyroidism     [place], Annye Rusk MS, CCC-SLP 11/22/2017, 2:45 PM  [facility] [address] [place], [place], [address] Phone: [phone]   Fax:  [phone]   Name: Cassandra Ray MRN: [id number] Date of Birth: Jun 16, 1922

## 2017-11-26 ENCOUNTER — Ambulatory Visit: Payer: Medicare Other | Admitting: Speech Pathology

## 2017-11-26 DIAGNOSIS — R1313 Dysphagia, pharyngeal phase: Secondary | ICD-10-CM

## 2017-11-26 NOTE — Therapy (Signed)
[facility] [address] [place], [place], [address] Phone: [phone]   Fax:  [phone]  Speech Language Pathology Treatment  Patient Details  Name: Cassandra Ray MRN: [id number] Date of Birth: 07/10/1922 Referring Provider: Wray Kearns, MD   Encounter Date: 11/26/2017  End of Session - 11/26/17 1734    Visit Number  5    Number of Visits  17    Date for SLP Re-Evaluation  12/14/17    SLP Start Time  1408    SLP Stop Time   1445    SLP Time Calculation (min)  37 min    Activity Tolerance  Patient tolerated treatment well       Past Medical History:  Diagnosis Date  . Actinic keratoses   . Arthritis    back   . Atrial fibrillation ([place])    a. questionable episode in 2015 in [facility]. b. event monitor through December/January 2014/2015 which showed no evidence of atrial fibrillation.  . Breast cancer ([place])    Breast cancer (right)  . Chronic diastolic CHF (congestive heart failure) ([place])   . Compression fracture of L3 lumbar vertebra   . GERD (gastroesophageal reflux disease)    takes otc Pepcid as needed  . Hiatal hernia 11/27/2014  . HTN (hypertension)   . Hypothyroidism   . Insomnia   . Osteopenia     Past Surgical History:  Procedure Laterality Date  . BREAST LUMPECTOMY Right 1995   lumpectomy  . COLONOSCOPY    . EYE SURGERY Bilateral    cataract w/ lens implant  . KYPHOPLASTY N/A 09/18/2013   Procedure: KYPHOPLASTY;  Surgeon: Sinclair Ship, MD;  Location: [facility];  Service: Orthopedics;  Laterality: N/A;  Lumbar 3 kyphoplasty  . TONSILLECTOMY      There were no vitals filed for this visit.  Subjective Assessment - 11/26/17 1409    Subjective  "Well that's the whole thing isn't it?" re tucking her chin    Currently in Pain?  No/denies            ADULT SLP TREATMENT - 11/26/17 1411      General Information   Behavior/Cognition  Alert;Cooperative;Pleasant mood       Treatment Provided   Treatment provided  Dysphagia      Dysphagia Treatment   Temperature Spikes Noted  No    Respiratory Status  Room air    Oral Cavity - Dentition  Adequate natural dentition    Treatment Methods  Skilled observation;Compensation strategy training;Patient/caregiver education;Differential diagnosis    Patient observed directly with PO's  Yes    Type of PO's observed  Thin liquids    Liquids provided via  Cup    Pharyngeal Phase Signs & Symptoms  Wet vocal quality    Type of cueing  Verbal    Amount of cueing  Minimal    Other treatment/comments  Pt reports doing exercises every day, once per day. "I feel like once a day is enough." When SLP attempted to review exercises provided in handouts section of pt's chart, she was certain some of them were incorrect. Pt required cues not to complete [place] with water, but to take sips between swallows if necessary. Supervision cues for effortful swallow. SLP worked with pt on PT mat for Shaker exercise. Mod cues (verbal, tactile) required initially to refrain from lifting shoulders, fading to min cues (verbal). Pt again expresses reluctance to follow swallow precautions in the dining room because "I  need to have conversations." SLP educated re: rationale for precautions and importance of completing with all meals. Educated re: completing exercises to recommended frequency for maximum benefit.       Pain Assessment   Pain Assessment  No/denies pain      Assessment / Recommendations / Plan   Plan  Continue with current plan of care      Dysphagia Recommendations   Diet recommendations  Dysphagia 3 (mechanical soft)    Liquids provided via  Cup;Straw    Medication Administration  Whole meds with puree    Supervision  Patient able to self feed;Intermittent supervision to cue for compensatory strategies    Compensations  Slow rate;Small sips/bites;Multiple dry swallows after each bite/sip;Clear throat intermittently;Effortful swallow     Postural Changes and/or Swallow Maneuvers  Upright 30-60 min after meal;Chin tuck;Seated upright 90 degrees      Progression Toward Goals   Progression toward goals  Progressing toward goals       SLP Education - 11/26/17 1734    Education Details  recommended frequency for exercises    Person(s) Educated  Patient    Methods  Explanation    Comprehension  Verbalized understanding;Need further instruction       SLP Short Term Goals - 11/26/17 1412      SLP SHORT TERM GOAL #1   Title  Pt will follow diet modifications with rare min A over 2 sessions    Baseline  11/22/17    Time  2    Period  Weeks    Status  On-going      SLP SHORT TERM GOAL #2   Title  Pt will complete HEP for dysphagia with occasional min A    Baseline  6/20;19    Time  2    Period  Weeks    Status  On-going      SLP SHORT TERM GOAL #3   Title  Pt/spouse will verbalize s/s of aspiration pna with rare min A over 2 sessions    Time  2    Period  Weeks    Status  On-going       SLP Long Term Goals - 11/26/17 1735      SLP LONG TERM GOAL #1   Title  Pt will perform HEP for dysphagia with mod I    Time  6    Period  Weeks    Status  On-going      SLP LONG TERM GOAL #2   Title  Pt will follow diet modifications and swallow protocol with mod I over 2 sessions    Time  6    Period  Weeks    Status  On-going       Plan - 11/26/17 1735    Clinical Impression Statement  Pt arrived alone to ST today. Continued training today in swallow precautions; pt recalled very little of her prior visits or recommendations. Pt will need ongoing cues from family for precautions due to reduced memory. Plan to review exercises with pt next session; would benefit from family participation in session to improve carryover at home. Continue skilled ST to reduce risk of aspiration pna.     Speech Therapy Frequency  2x / week    Treatment/Interventions  Aspiration precaution training;Environmental controls;Cueing  hierarchy;Pharyngeal strengthening exercises;Diet toleration management by SLP;Trials of upgraded texture/liquids;Internal/external aids;Patient/family education    Potential to Achieve Goals  Fair    Potential Considerations  Ability to learn/carryover information;Cooperation/participation level  Consulted and Agree with Plan of Care  Patient       Patient will benefit from skilled therapeutic intervention in order to improve the following deficits and impairments:   Dysphagia, pharyngeal phase    Problem List Patient Active Problem List   Diagnosis Date Noted  . Bilateral impacted cerumen 11/22/2016  . Presbycusis of both ears 11/22/2016  . Pleural effusion, right 02/08/2016  . Acute on chronic diastolic CHF (congestive heart failure) ([place]) 02/08/2016  . Lower extremity edema 01/31/2016  . Chronic diastolic heart failure ([place]) 02/03/2015  . Hyponatremia 04/25/2014  . SOB (shortness of breath)   . Hypertensive urgency 04/24/2014  . Compression fracture 09/18/2013  . Knee pain 08/11/2013  . A-fib ([place]) 05/09/2013  . Back pain 05/09/2013  . HTN (hypertension)   . Hypothyroidism    Deneise Lever, [place], [employer] Speech-Language Pathologist  Aliene Altes 11/26/2017, 5:36 PM  [facility] [address] [place], [place], [address] Phone: [phone]   Fax:  [phone]   Name: YANA SCHORR MRN: [id number] Date of Birth: [birth year]

## 2017-11-27 ENCOUNTER — Encounter: Payer: Self-pay | Admitting: Physician Assistant

## 2017-11-27 ENCOUNTER — Ambulatory Visit: Payer: Medicare Other | Admitting: Physician Assistant

## 2017-11-27 NOTE — Progress Notes (Addendum)
Cardiology Office Note    Date:  11/28/2017  ID:  Cassandra Ray, Cassandra Ray 1923-01-29, MRN [id number] PCP:  Lawerance Cruel, MD  Cardiologist:  Candee Furbish, MD   Chief Complaint: f/u CHF  History of Present Illness:  Cassandra Ray is a 84 y.o. female with history of HTN, hypothyroidism, breast CA, GERD, diastolic dysfunction/probable chronic diastolic CHF, chronic hyponatremia, pancreatic cyst, questionable prior atrial fibrillation who presents for follow-up.   Per review of Dr. [name] note in 2014 she was hospitalized in [facility] with nausea, vomiting, and weakness after eating seafood. She was also hypertensive. She underwent a stress test which showed no evidence of ischemia, low risk, normal EF. Echocardiogram showed EF of 55-60% with mild LVH, mild pulmonary hypertension, trace TR. She was diagnosed with A. fib RVR and was treated with IV Lopressor, although upon Dr. [name]' review of the strips, he could not clearly state that she had AF as there did appear to be P waves present. Abdominal ultrasound showed an incidental finding of pancreatic cyst with recommendations to repeat in 6-12 months. She underwent event monitor through December/January 2014/2015 which showed no evidence of atrial fibrillation. She was admitted2015 with SOB/hypertensive emergency/pulm edema requiring Lasix - echo with mod LVH, EF 65-70%, grade 2 DD, mild MR, mild LAE. Notable labs 2015 showed Na 123. In 2017 she was seen for worsening shortness of breath with a moderate rihgt pleural effusion treated with Lasix. Thoracentesis was considered but there was not enough fluid to tap by the time she had the procedure. She was also reporting drenching night sweats and was advised to f/u with PCP. She has also followed with GI for ongoing diarrhea at which time she was felt to have pancreatic insufficiency, but also multifactorial. She was placed on Zenpep. When I saw her in f/u 12/01/16 she was actually doing much better than  the fall before. Labs 11/2016 showed mild AKI with BUN 43/Cr 1.33 (baseline 0.9-1.1) prompting reduction in Lasix to 20mg  daily with 1 extra tablet only PRN. In the end of 2018 she had a brief episode of chest pain but was able to resume to normal exercising so conservative approach was undertaken. She's also been followed by GI for dysphagia and has been taking lessons to help her learn to strengthen her swallowing muscles.  She returns for routine follow-up today with husband Bill. From a cardiac perspective she feels she feels she has been doing great. She is exercising 3x a week in a chair class. She denies any recurrent CP or dyspnea. No palpitations, dizziness or syncope. KPN indicates BMET 10/2017 with K 5.4 and Cr 1.35. She does not take any potassium supplements. She is pleased with how she is feeling. She indicates she is taking one of her Lasix tablets daily (20mg ).   Past Medical History:  Diagnosis Date  . Actinic keratoses   . Arthritis    back   . Atrial fibrillation ([place])    a. questionable episode in 2015 in [facility]. b. event monitor through December/January 2014/2015 which showed no evidence of atrial fibrillation.  . Breast cancer ([place])    Breast cancer (right)  . Chronic diastolic CHF (congestive heart failure) ([place])   . Compression fracture of L3 lumbar vertebra   . Dysphagia   . GERD (gastroesophageal reflux disease)    takes otc Pepcid as needed  . Hiatal hernia 11/27/2014  . HTN (hypertension)   . Hypothyroidism   . Insomnia   . Osteopenia   .  Pleural effusion     Past Surgical History:  Procedure Laterality Date  . BREAST LUMPECTOMY Right 1995   lumpectomy  . COLONOSCOPY    . EYE SURGERY Bilateral    cataract w/ lens implant  . KYPHOPLASTY N/A 09/18/2013   Procedure: KYPHOPLASTY;  Surgeon: Sinclair Ship, MD;  Location: [place];  Service: Orthopedics;  Laterality: N/A;  Lumbar 3 kyphoplasty  . TONSILLECTOMY      Current Medications: Current Meds    Medication Sig  . b complex vitamins tablet Take 1 tablet by mouth daily.  . Calcium Citrate (CITRACAL PO) Take 1 tablet by mouth daily.  Marland Kitchen CALCIUM CITRATE-VITAMIN D3 PO Take by mouth.  . Cholecalciferol (VITAMIN D3) 2000 units TABS Take by mouth daily at 2 PM.  . CREON 36000 units CPEP capsule TAKE 2 CAPSULES WITH MEALS AND 1 CAPSULE WITH SNACKS.  . DULoxetine (CYMBALTA) 30 MG capsule Take 30 mg by mouth daily.   . furosemide (LASIX) 20 MG tablet TAKE 2 TABLETS DAILY AS DIRECTED. Currently taking 1 tablet daily.  . Glucosamine-Chondroit-Vit C-Mn (GLUCOSAMINE 1500 COMPLEX PO) Take 1 capsule by mouth daily.  Marland Kitchen levothyroxine (SYNTHROID, LEVOTHROID) 125 MCG tablet Take 125 mcg by mouth daily before breakfast.  . loperamide (IMODIUM A-D) 2 MG tablet Take 2 mg by mouth as needed for diarrhea or loose stools.  Marland Kitchen losartan (COZAAR) 50 MG tablet Take 50 mg by mouth daily.  . Melatonin 5 MG TABS Take 1 tablet by mouth at bedtime as needed (sleep).  . metoprolol (LOPRESSOR) 100 MG tablet Take 100 mg by mouth 2 (two) times daily.  . nitroGLYCERIN (NITROSTAT) 0.4 MG SL tablet Place 0.4 mg under the tongue every 5 (five) minutes as needed for chest pain.  . pantoprazole (PROTONIX) 40 MG tablet Take 40 mg by mouth daily.  . Probiotic Product (ALIGN PO) Take by mouth daily at 2 PM.  . SYMBICORT 80-4.5 MCG/ACT inhaler Take 2 puffs by mouth 2 (two) times daily.  . valACYclovir (VALTREX) 1000 MG tablet     Allergies:   Patient has no known allergies.   Social History   Socioeconomic History  . Marital status: Married    Spouse name: Not on file  . Number of children: Not on file  . Years of education: Not on file  . Highest education level: Not on file  Occupational History  . Not on file  Social Needs  . Financial resource strain: Not on file  . Food insecurity:    Worry: Not on file    Inability: Not on file  . Transportation needs:    Medical: Not on file    Non-medical: Not on file   Tobacco Use  . Smoking status: Former Research scientist (life sciences)  . Smokeless tobacco: Never Used  . Tobacco comment: only in college  Substance and Sexual Activity  . Alcohol use: Yes    Alcohol/week: 4.2 oz    Types: 7 Glasses of wine per week    Comment: 1 glass of wine a day  . Drug use: No  . Sexual activity: Not on file  Lifestyle  . Physical activity:    Days per week: Not on file    Minutes per session: Not on file  . Stress: Not on file  Relationships  . Social connections:    Talks on phone: Not on file    Gets together: Not on file    Attends religious service: Not on file    Active member of club  or organization: Not on file    Attends meetings of clubs or organizations: Not on file    Relationship status: Not on file  Other Topics Concern  . Not on file  Social History Narrative  . Not on file     Family History:  The patient's family history includes CAD in her mother; Diabetes Mellitus II in her mother; Heart disease in her sister; Heart failure in her father and mother; Kidney cancer in her brother; Prostate cancer in her brother. There is no history of Esophageal cancer or Colon cancer.  ROS:    All other systems are reviewed and otherwise negative.    PHYSICAL EXAM:   VS:  BP 118/66   Pulse 83   Ht 5\' 4"  (1.626 m)   Wt 171 lb 1.9 oz (77.6 kg)   LMP  (LMP Unknown)   SpO2 97%   BMI 29.37 kg/m   BMI: Body mass index is 29.37 kg/m. GEN: Well nourished, well developed elderly WF, in no acute distress. Well groomed. HEENT: normocephalic, atraumatic Neck: no JVD, carotid bruits, or masses Cardiac: RRR; no murmurs, rubs, or gallops, trace ankle edema  Respiratory:  Slightly coarser BS at bases but overall clear to auscultation bilaterally, no wheezes/rales/rhonchi, normal work of breathing GI: soft, nontender, nondistended, + BS MS: no deformity or atrophy Skin: warm and dry, no rash, varicose veins bilaterally without evidence of phlebitis, chronic venous stasis  change Neuro:  Alert and Oriented x 3, Strength and sensation are intact, follows commands Psych: euthymic mood, full affect  Wt Readings from Last 3 Encounters:  11/28/17 171 lb 1.9 oz (77.6 kg)  08/28/17 177 lb 8 oz (80.5 kg)  05/23/17 154 lb 6.4 oz (70 kg)      Studies/Labs Reviewed:   EKG:  EKG was ordered today and personally reviewed by me and demonstrates NSR 77bpm, prior inferior infarct, no acute changes. No sig change from prior.   Recent Labs: 12/01/2016: Magnesium 2.3 02/06/2017: BUN 36; Creatinine, Ser 1.35; Hemoglobin 12.2; Platelets 208; Potassium 4.6; Sodium 139   Lipid Panel No results found for: CHOL, TRIG, HDL, CHOLHDL, VLDL, LDLCALC, LDLDIRECT  Additional studies/ records that were reviewed today include: Summarized above   ASSESSMENT & PLAN:   1. Chronic diastolic CHF - overall appears euvolemic. She has mild ankle edema but states this fluctuates and improves with elevation. Discussed elevation, compression, and sodium restriction. She clarified she is taking Lasix 20mg  once daily. Will f/u BMET today given CKD and recent hyperkalemia to ensure we're on the right track with this. We did discuss the option to take 1 extra tablet daily as needed for weight gain, increased edema or sense of fluid retention. 2. History of chest pain - resolved, continue surveillance. 3. Essential HTN - controlled. 4. History of pleural effusion - clinically has normal O2 sat and no symptoms. She has good air movement throughout. Given age and asymptomatic nature will continue to monitor clinically. I wonder if this had anything to do with her dysphagia in the past (?aspiration) - she is working with GI on this.  Disposition: F/u with Dr. [name] in 6 months, sooner if needed.   Medication Adjustments/Labs and Tests Ordered: Current medicines are reviewed at length with the patient today.  Concerns regarding medicines are outlined above. Medication changes, Labs and Tests ordered  today are summarized above and listed in the Patient Instructions accessible in Encounters.   Signed, Charlie Pitter, PA-C  11/28/2017 10:37 AM  [facility] [place], [place], [place]  [address] Phone: [phone]; Fax: [phone]

## 2017-11-28 ENCOUNTER — Encounter: Payer: Self-pay | Admitting: Physician Assistant

## 2017-11-28 ENCOUNTER — Ambulatory Visit: Payer: Medicare Other | Admitting: Physician Assistant

## 2017-11-28 VITALS — BP 118/66 | HR 83 | Ht 64.0 in | Wt 171.1 lb

## 2017-11-28 DIAGNOSIS — Z87898 Personal history of other specified conditions: Secondary | ICD-10-CM

## 2017-11-28 DIAGNOSIS — Z8709 Personal history of other diseases of the respiratory system: Secondary | ICD-10-CM | POA: Diagnosis not present

## 2017-11-28 DIAGNOSIS — I10 Essential (primary) hypertension: Secondary | ICD-10-CM

## 2017-11-28 DIAGNOSIS — I5032 Chronic diastolic (congestive) heart failure: Secondary | ICD-10-CM | POA: Diagnosis not present

## 2017-11-28 LAB — BASIC METABOLIC PANEL
BUN / CREAT RATIO: 22 (ref 12–28)
BUN: 30 mg/dL (ref 10–36)
CHLORIDE: 105 mmol/L (ref 96–106)
CO2: 24 mmol/L (ref 20–29)
Calcium: 9.7 mg/dL (ref 8.7–10.3)
Creatinine, Ser: 1.39 mg/dL — ABNORMAL HIGH (ref 0.57–1.00)
GFR, EST AFRICAN AMERICAN: 38 mL/min/{1.73_m2} — AB (ref >59)
GFR, EST NON AFRICAN AMERICAN: 33 mL/min/{1.73_m2} — AB (ref >59)
Glucose: 74 mg/dL (ref 65–99)
POTASSIUM: 4.6 mmol/L (ref 3.5–5.2)
SODIUM: 143 mmol/L (ref 134–144)

## 2017-11-28 MED ORDER — FUROSEMIDE 20 MG PO TABS: 20.0000 mg | ORAL_TABLET | Freq: Every day | ORAL | 3 refills | Status: DC

## 2017-11-28 NOTE — Patient Instructions (Addendum)
Medication Instructions:  Your physician recommends that you continue on your current medications as directed. Please refer to the Current Medication list given to you today.   Labwork: TODAY:  BMET  Testing/Procedures: None ordered  Follow-Up: Your physician wants you to follow-up in:  6 MONTHS WITH DR. [name]    Any Other Special Instructions Will Be Listed Below (If Applicable).     If you need a refill on your cardiac medications before your next appointment, please call your pharmacy.

## 2017-11-28 NOTE — Addendum Note (Signed)
Addended by: Gaetano Net on: 11/28/2017 04:34 PM   Modules accepted: Orders

## 2017-11-29 ENCOUNTER — Ambulatory Visit: Payer: Medicare Other | Admitting: Speech Pathology

## 2017-11-29 DIAGNOSIS — R1313 Dysphagia, pharyngeal phase: Secondary | ICD-10-CM

## 2017-11-29 NOTE — Patient Instructions (Addendum)
SWALLOWING EXERCISES 1. Effortful Swallows - Squeeze hard with the muscles in your neck while you swallow your  saliva or a sip of water - Repeat 20 times, 2-3 times a day, and use whenever you eat or drink  2. Masako Swallow - swallow with your tongue sticking out - Stick tongue out and gently bite tongue with your teeth - Swallow, while holding your tongue with your teeth - Repeat 10 times, 2-3 times a day - do not take a drink with this. You can drink in between these, but not while doing them  3. Pitch Raise - Repeat "he", once per second in as high of a pitch as you can - Repeat 20 times, 2-3 times a day  4. Shaker Exercise - head lift - Lie flat on your back in your bed or on a couch without pillows - Raise your head and look at your feet  - KEEP YOUR SHOULDERS DOWN - HOLD FOR 45 SECONDS, then lower your head back down - Repeat 3 times, 2-3 times a day   5. Tongue Press - Press your entire tongue as hard as you can against the roof of your mouth for 3-5 seconds - Repeat 20 times, 2-3 times a day  6. Tongue Stretch/Teeth Clean - Move your tongue around the pocket between your gums and teeth, clockwise and then counter-clockwise - Repeat on the back side, clockwise and then counter-clockwise - Repeat 15-20 times, 2-3 times a day   7. Stick out your tongue and say "GA-GA-GA" loud and shart           -Repeat 25 sets of 3, 2-3 times a day  8. Say ING-GA loud and exaggerated - get a good "GA" sound             - 25 times 2-3 times a day  9. Scrape the roof of your mouth starting behind your teeth, back to your throat 10x 2-3 times a day  Chin tuck with all swallows  2 swallows for liquids  Cough or "Hock" intermittently to clear our any residue in your throat  Pills in applesauce, yogurt, pudding

## 2017-11-29 NOTE — Therapy (Signed)
[facility] [address] [place], [place], [address] Phone: [phone]   Fax:  [phone]  Speech Language Pathology Treatment  Patient Details  Name: Cassandra Ray MRN: [id number] Date of Birth: 1923/04/17 Referring Provider: Wray Kearns, MD   Encounter Date: 11/29/2017  End of Session - 11/29/17 1814    Visit Number  6    Number of Visits  17    Date for SLP Re-Evaluation  12/14/17    SLP Start Time  51    SLP Stop Time   1444    SLP Time Calculation (min)  34 min    Activity Tolerance  Patient tolerated treatment well       Past Medical History:  Diagnosis Date  . Actinic keratoses   . Arthritis    back   . Atrial fibrillation ([place])    a. questionable episode in 2015 in [facility]. b. event monitor through December/January 2014/2015 which showed no evidence of atrial fibrillation.  . Breast cancer ([place])    Breast cancer (right)  . Chronic diastolic CHF (congestive heart failure) ([place])   . Compression fracture of L3 lumbar vertebra   . Dysphagia   . GERD (gastroesophageal reflux disease)    takes otc Pepcid as needed  . Hiatal hernia 11/27/2014  . HTN (hypertension)   . Hypothyroidism   . Insomnia   . Osteopenia   . Pleural effusion     Past Surgical History:  Procedure Laterality Date  . BREAST LUMPECTOMY Right 1995   lumpectomy  . COLONOSCOPY    . EYE SURGERY Bilateral    cataract w/ lens implant  . KYPHOPLASTY N/A 09/18/2013   Procedure: KYPHOPLASTY;  Surgeon: Sinclair Ship, MD;  Location: [facility];  Service: Orthopedics;  Laterality: N/A;  Lumbar 3 kyphoplasty  . TONSILLECTOMY      There were no vitals filed for this visit.  Subjective Assessment - 11/29/17 1412    Subjective  "Oh no I forgot!" re exercises    Currently in Pain?  No/denies            ADULT SLP TREATMENT - 11/29/17 1410      General Information   Behavior/Cognition  Alert;Cooperative;Pleasant mood     Patient Positioning  Upright in chair      Treatment Provided   Treatment provided  Dysphagia      Dysphagia Treatment   Temperature Spikes Noted  No    Respiratory Status  Room air    Oral Cavity - Dentition  Adequate natural dentition    Treatment Methods  Skilled observation;Compensation strategy training;Patient/caregiver education;Differential diagnosis    Patient observed directly with PO's  Yes    Type of PO's observed  Thin liquids;Dysphagia 3 (soft)    Oral Phase Signs & Symptoms  Prolonged mastication;Other (comment) mild oral residue    Pharyngeal Phase Signs & Symptoms  Delayed cough    Type of cueing  Verbal    Amount of cueing  Moderate    Other treatment/comments  Pt forgot to bring exercises from home. SLP reviewed exercises on file and revised handout to reflect exercises she was instructed to complete by previous therapist. She reports she is completing these exercises "several times a day" with the exception of the tongue press, which she fears will loosen her teeth. SLP worked with pt on this exercise for technique, emphasizing whole tongue press vs tongue tip. Occasional min A for remainder of HEP. Pt admits she has difficulty  remembering to follow her swallow precautions at dinner when eating in the dining room with friends. SLP suggested visual reminders or temporary environmental modification (sitting alone with husband) until she is able to habituate precautions. Pt required moderate cuing to use swallow precautions, primarily for timing of chin tuck but X1 to refrain from multiple sips of liquid (though pt did demonstrate awareness of this).       Pain Assessment   Pain Assessment  No/denies pain      Dysphagia Recommendations   Diet recommendations  Dysphagia 3 (mechanical soft);Thin liquid    Liquids provided via  Cup;Straw    Medication Administration  Whole meds with puree    Supervision  Patient able to self feed;Intermittent supervision to cue for compensatory  strategies    Compensations  Slow rate;Small sips/bites;Multiple dry swallows after each bite/sip;Clear throat intermittently;Effortful swallow    Postural Changes and/or Swallow Maneuvers  Upright 30-60 min after meal;Chin tuck;Seated upright 90 degrees      Progression Toward Goals   Progression toward goals  Progressing toward goals       SLP Education - 11/29/17 1814    Education Details  updated HEP; reduce distractions at mealtimes    Person(s) Educated  Patient    Methods  Explanation;Handout    Comprehension  Verbalized understanding;Need further instruction       SLP Short Term Goals - 11/29/17 1440      SLP SHORT TERM GOAL #1   Title  Pt will follow diet modifications with rare min A over 2 sessions    Baseline  11/22/17    Time  2    Period  Weeks    Status  On-going      SLP SHORT TERM GOAL #2   Title  Pt will complete HEP for dysphagia with occasional min A    Baseline  6/20;19, 11/29/17    Time  2    Period  Weeks    Status  On-going      SLP SHORT TERM GOAL #3   Title  Pt/spouse will verbalize s/s of aspiration pna with rare min A over 2 sessions    Time  2    Period  Weeks    Status  On-going       SLP Long Term Goals - 11/29/17 1442      SLP LONG TERM GOAL #1   Title  Pt will perform HEP for dysphagia with mod I    Time  6    Period  Weeks    Status  On-going      SLP LONG TERM GOAL #2   Title  Pt will follow diet modifications and swallow protocol with mod I over 2 sessions    Time  6    Period  Weeks    Status  On-going       Plan - 11/29/17 1815    Clinical Impression Statement  Pt arrived alone to ST today. Continued training today in swallow precautions; mod cues required today. Occasional min A for HEP. Pt will need ongoing cues from family for precautions due to reduced memory. Pt would benefit from family participation in session to improve carryover at home. Continue skilled ST to reduce risk of aspiration pna.     Speech Therapy  Frequency  2x / week    Treatment/Interventions  Aspiration precaution training;Environmental controls;Cueing hierarchy;Pharyngeal strengthening exercises;Diet toleration management by SLP;Trials of upgraded texture/liquids;Internal/external aids;Patient/family education    Potential to Achieve Goals  Fair    Potential Considerations  Ability to learn/carryover information;Cooperation/participation level    Consulted and Agree with Plan of Care  Patient       Patient will benefit from skilled therapeutic intervention in order to improve the following deficits and impairments:   Dysphagia, pharyngeal phase    Problem List Patient Active Problem List   Diagnosis Date Noted  . Bilateral impacted cerumen 11/22/2016  . Presbycusis of both ears 11/22/2016  . Pleural effusion, right 02/08/2016  . Acute on chronic diastolic CHF (congestive heart failure) ([place]) 02/08/2016  . Lower extremity edema 01/31/2016  . Chronic diastolic heart failure ([place]) 02/03/2015  . Hyponatremia 04/25/2014  . SOB (shortness of breath)   . Hypertensive urgency 04/24/2014  . Compression fracture 09/18/2013  . Knee pain 08/11/2013  . A-fib ([place]) 05/09/2013  . Back pain 05/09/2013  . HTN (hypertension)   . Hypothyroidism    Deneise Lever, [place], CCC-SLP Speech-Language Pathologist  Aliene Altes 11/29/2017, 6:18 PM  [facility] [address] [place], [place], [address] Phone: [phone]   Fax:  [phone]   Name: Cassandra Ray MRN: [id number] Date of Birth: 02-16-1923

## 2017-12-03 ENCOUNTER — Ambulatory Visit: Payer: Medicare Other | Attending: Gastroenterology | Admitting: Speech Pathology

## 2017-12-03 DIAGNOSIS — R1313 Dysphagia, pharyngeal phase: Secondary | ICD-10-CM | POA: Insufficient documentation

## 2017-12-03 NOTE — Patient Instructions (Signed)
SWALLOWING EXERCISES 1. Effortful Swallows - Squeeze hard with the muscles in your neck while you swallow your  saliva or a sip of water - Repeat 20 times, 2-3 times a day, and use whenever you eat or drink  2. Masako Swallow- swallow with your tongue sticking out - Stick tongue out and gently bite tongue with your teeth - Swallow, while holding your tongue with your teeth - Repeat10 times, 2-3 times a day- do not take a drink with this. You can drink in between these, but not while doing them  3. Pitch Raise - Repeat "he", once per second in as high of a pitch as you can - Repeat 20 times, 2-3 times a day  4. Shaker Exercise - head lift - Lie flat on your back in your bed or on a couch without pillows - Raise your head and look at your feet - KEEP YOUR SHOULDERS DOWN - HOLD FOR 45 SECONDS, then lower your head back down - Repeat 3 times, 2-3 times a day   5. Tongue Press - Press your entire tongue as hard as you can against the roof of your mouth for 3-5 seconds - Repeat 20 times, 2-3 times a day  6. Tongue Stretch/Teeth Clean - Move your tongue around the pocket between your gums and teeth, clockwise and then counter-clockwise - Repeat on the back side, clockwise and then counter-clockwise - Repeat 15-20 times, 2-3 times a day   7. Stick out your tongue and say "GA-GA-GA" loud and shart -Repeat 25 sets of 3, 2-3 times a day  8. Say ING-GA loud and exaggerated- get a good "GA" sound - 25 times 2-3 times a day  9. Scrape the roof of your mouthstarting behind your teeth, back to your throat 10x 2-3 times a day  Chin tuck with all swallows  2 swallows for liquids  Cough or "Hock" intermittently to clear our any residue in your throat  Pills in applesauce, yogurt, pudding

## 2017-12-03 NOTE — Therapy (Signed)
[facility] [address] [place], [place], [address] Phone: [phone]   Fax:  [phone]  Speech Language Pathology Treatment  Patient Details  Name: Cassandra Ray MRN: [id number] Date of Birth: 1922/12/13 Referring Provider: Wray Kearns, MD   Encounter Date: 12/03/2017  End of Session - 12/03/17 1434    Visit Number  7    Number of Visits  17    Date for SLP Re-Evaluation  12/14/17    SLP Start Time  64    SLP Stop Time   1433    SLP Time Calculation (min)  31 min    Activity Tolerance  Patient tolerated treatment well       Past Medical History:  Diagnosis Date  . Actinic keratoses   . Arthritis    back   . Atrial fibrillation ([place])    a. questionable episode in 2015 in [facility]. b. event monitor through December/January 2014/2015 which showed no evidence of atrial fibrillation.  . Breast cancer ([place])    Breast cancer (right)  . Chronic diastolic CHF (congestive heart failure) ([place])   . Compression fracture of L3 lumbar vertebra   . Dysphagia   . GERD (gastroesophageal reflux disease)    takes otc Pepcid as needed  . Hiatal hernia 11/27/2014  . HTN (hypertension)   . Hypothyroidism   . Insomnia   . Osteopenia   . Pleural effusion     Past Surgical History:  Procedure Laterality Date  . BREAST LUMPECTOMY Right 1995   lumpectomy  . COLONOSCOPY    . EYE SURGERY Bilateral    cataract w/ lens implant  . KYPHOPLASTY N/A 09/18/2013   Procedure: KYPHOPLASTY;  Surgeon: Sinclair Ship, MD;  Location: [facility];  Service: Orthopedics;  Laterality: N/A;  Lumbar 3 kyphoplasty  . TONSILLECTOMY      There were no vitals filed for this visit.  Subjective Assessment - 12/03/17 1406    Subjective  "I'm getting tired of them."    Currently in Pain?  No/denies            ADULT SLP TREATMENT - 12/03/17 0204      General Information   Behavior/Cognition  Alert;Cooperative;Pleasant mood      Treatment Provided   Treatment provided  Dysphagia      Dysphagia Treatment   Temperature Spikes Noted  No    Respiratory Status  Room air    Oral Cavity - Dentition  Adequate natural dentition    Treatment Methods  Skilled observation;Compensation strategy training;Patient/caregiver education;Differential diagnosis    Patient observed directly with PO's  Yes    Type of PO's observed  Thin liquids;Dysphagia 3 (soft)    Feeding  Able to feed self    Liquids provided via  Cup    Pharyngeal Phase Signs & Symptoms  Other (comment) none observed today    Type of cueing  Verbal    Amount of cueing  Minimal    Other treatment/comments  Pt recalled 1 sign of aspiration pneumonia (fever). SLP reviewed additional signs. She reports doing all exercises multiple times per day with exception of "nga" and "ha" (once per day). Demo'd HEP with occasional min A required (not to drink liquid while doing Masako), for duration of hold during tongue press. Pt demo'd swallowing precautions, min cue x 1 for chin down before starting to swallow.       Pain Assessment   Pain Assessment  No/denies pain  Assessment / Recommendations / Plan   Plan  Continue with current plan of care      Dysphagia Recommendations   Diet recommendations  Dysphagia 3 (mechanical soft);Thin liquid    Liquids provided via  Cup;Straw    Medication Administration  Whole meds with puree    Supervision  Patient able to self feed;Intermittent supervision to cue for compensatory strategies    Compensations  Slow rate;Small sips/bites;Multiple dry swallows after each bite/sip;Clear throat intermittently;Effortful swallow    Postural Changes and/or Swallow Maneuvers  Upright 30-60 min after meal;Chin tuck;Seated upright 90 degrees      Progression Toward Goals   Progression toward goals  Progressing toward goals       SLP Education - 12/03/17 1411    Education Details  tuck chin BEFORE swallow    Person(s) Educated  Patient     Methods  Explanation;Verbal cues;Handout    Comprehension  Verbalized understanding;Returned demonstration;Need further instruction       SLP Short Term Goals - 12/03/17 1408      SLP SHORT TERM GOAL #1   Title  Pt will follow diet modifications with rare min A over 2 sessions    Baseline  11/22/17    Time  1    Period  Weeks    Status  On-going      SLP SHORT TERM GOAL #2   Title  Pt will complete HEP for dysphagia with occasional min A    Baseline  6/20;19, 11/29/17, 12/03/17    Time  1    Period  Weeks    Status  On-going      SLP SHORT TERM GOAL #3   Title  Pt/spouse will verbalize s/s of aspiration pna with rare min A over 2 sessions    Baseline  1 sign 12/03/17    Time  1    Period  Weeks    Status  On-going       SLP Long Term Goals - 12/03/17 1409      SLP LONG TERM GOAL #1   Title  Pt will perform HEP for dysphagia with mod I    Time  5    Period  Weeks    Status  On-going      SLP LONG TERM GOAL #2   Title  Pt will follow diet modifications and swallow protocol with mod I over 2 sessions    Time  5    Period  Weeks    Status  On-going       Plan - 12/03/17 1434    Clinical Impression Statement  Continued training today in swallow precautions; rare min A required today. Occasional min A for HEP. Pt will need ongoing cues from family for precautions due to reduced memory. Pt would benefit from family participation in session to improve carryover at home. Continue skilled ST to reduce risk of aspiration pna.     Speech Therapy Frequency  2x / week    Treatment/Interventions  Aspiration precaution training;Environmental controls;Cueing hierarchy;Pharyngeal strengthening exercises;Diet toleration management by SLP;Trials of upgraded texture/liquids;Internal/external aids;Patient/family education    Potential to Achieve Goals  Fair    Potential Considerations  Ability to learn/carryover information;Cooperation/participation level    Consulted and Agree with Plan of  Care  Patient       Patient will benefit from skilled therapeutic intervention in order to improve the following deficits and impairments:   Dysphagia, pharyngeal phase    Problem List Patient Active Problem List  Diagnosis Date Noted  . Bilateral impacted cerumen 11/22/2016  . Presbycusis of both ears 11/22/2016  . Pleural effusion, right 02/08/2016  . Acute on chronic diastolic CHF (congestive heart failure) ([place]) 02/08/2016  . Lower extremity edema 01/31/2016  . Chronic diastolic heart failure ([place]) 02/03/2015  . Hyponatremia 04/25/2014  . SOB (shortness of breath)   . Hypertensive urgency 04/24/2014  . Compression fracture 09/18/2013  . Knee pain 08/11/2013  . A-fib ([place]) 05/09/2013  . Back pain 05/09/2013  . HTN (hypertension)   . Hypothyroidism    Deneise Lever, [place], CCC-SLP Speech-Language Pathologist  Aliene Altes 12/03/2017, 2:39 PM  [facility] [address] [place], [place], [address] Phone: [phone]   Fax:  [phone]   Name: Cassandra Ray MRN: [id number] Date of Birth: 03-22-1923

## 2017-12-05 ENCOUNTER — Ambulatory Visit: Payer: Medicare Other

## 2017-12-05 ENCOUNTER — Other Ambulatory Visit: Payer: Self-pay | Admitting: Gastroenterology

## 2017-12-05 DIAGNOSIS — R1313 Dysphagia, pharyngeal phase: Secondary | ICD-10-CM

## 2017-12-05 NOTE — Therapy (Signed)
[facility] [address] [place], [place], [address] Phone: [phone]   Fax:  [phone]  Speech Language Pathology Treatment  Patient Details  Name: Cassandra Ray MRN: [id number] Date of Birth: 04-19-1923 Referring Provider: Wray Kearns, MD   Encounter Date: 12/05/2017  End of Session - 12/05/17 1728    Visit Number  8    Number of Visits  17    Date for SLP Re-Evaluation  12/14/17    SLP Start Time  2952    SLP Stop Time   1445    SLP Time Calculation (min)  42 min    Activity Tolerance  Patient tolerated treatment well       Past Medical History:  Diagnosis Date  . Actinic keratoses   . Arthritis    back   . Atrial fibrillation ([place])    a. questionable episode in 2015 in [facility]. b. event monitor through December/January 2014/2015 which showed no evidence of atrial fibrillation.  . Breast cancer ([place])    Breast cancer (right)  . Chronic diastolic CHF (congestive heart failure) ([place])   . Compression fracture of L3 lumbar vertebra   . Dysphagia   . GERD (gastroesophageal reflux disease)    takes otc Pepcid as needed  . Hiatal hernia 11/27/2014  . HTN (hypertension)   . Hypothyroidism   . Insomnia   . Osteopenia   . Pleural effusion     Past Surgical History:  Procedure Laterality Date  . BREAST LUMPECTOMY Right 1995   lumpectomy  . COLONOSCOPY    . EYE SURGERY Bilateral    cataract w/ lens implant  . KYPHOPLASTY N/A 09/18/2013   Procedure: KYPHOPLASTY;  Surgeon: Sinclair Ship, MD;  Location: [facility];  Service: Orthopedics;  Laterality: N/A;  Lumbar 3 kyphoplasty  . TONSILLECTOMY      There were no vitals filed for this visit.  Subjective Assessment - 12/05/17 1407    Subjective  "What are the requirements for graduation?"    Currently in Pain?  No/denies            ADULT SLP TREATMENT - 12/05/17 1435      General Information   Behavior/Cognition   Alert;Cooperative;Pleasant mood      Treatment Provided   Treatment provided  Dysphagia      Dysphagia Treatment   Temperature Spikes Noted  No    Respiratory Status  Room air    Oral Cavity - Dentition  Adequate natural dentition    Treatment Methods  Skilled observation;Compensation strategy training;Patient/caregiver education;Differential diagnosis    Patient observed directly with PO's  Yes    Type of PO's observed  Thin liquids;Dysphagia 3 (soft)    Pharyngeal Phase Signs & Symptoms  Other (comment)    Type of cueing  Verbal    Amount of cueing  Minimal    Other treatment/comments  See pt's "S" statement; Pt conveyed an eagerness to "graduate" from dysphagia therapy. SLP told her she would be d/c'd when she met LTGs which included performing HEP with modified independence (mod I), and follow swallow precautions with mod I. SLP targeted pt safety while eating by assessing her use of her swallowing strategies, which wre completed with rare min A today with 4 bites of cereal bar/5 sips water. With her HEP pt admitted she is completing once/day routinely. SLP strongly encouraged pt to complete x2/day. Pt voiced particular frustration with her difficulty with Masako. SLP told pt she needed to complete  the exercise more routinely for incr'd strength and that incr'd strength would then make the exercise easier to accomplish. Pt completed HEP today with min mod A rarely.      Assessment / Recommendations / Plan   Plan  Continue with current plan of care      Dysphagia Recommendations   Diet recommendations  Dysphagia 3 (mechanical soft);Thin liquid    Liquids provided via  Cup;Straw    Medication Administration  Whole meds with puree    Supervision  Patient able to self feed;Intermittent supervision to cue for compensatory strategies    Compensations  Slow rate;Small sips/bites;Multiple dry swallows after each bite/sip;Clear throat intermittently;Effortful swallow    Postural Changes and/or  Swallow Maneuvers  Upright 30-60 min after meal;Chin tuck;Seated upright 90 degrees      Progression Toward Goals   Progression toward goals  Progressing toward goals       SLP Education - 12/05/17 1728    Education Details  needs to do exercises BID    Person(s) Educated  Patient    Methods  Explanation    Comprehension  Verbalized understanding       SLP Short Term Goals - 12/03/17 1408      SLP SHORT TERM GOAL #1   Title  Pt will follow diet modifications with rare min A over 2 sessions    Baseline  11/22/17    Time  1    Period  Weeks    Status  On-going      SLP SHORT TERM GOAL #2   Title  Pt will complete HEP for dysphagia with occasional min A    Baseline  6/20;19, 11/29/17, 12/03/17    Time  1    Period  Weeks    Status  On-going      SLP SHORT TERM GOAL #3   Title  Pt/spouse will verbalize s/s of aspiration pna with rare min A over 2 sessions    Baseline  1 sign 12/03/17    Time  1    Period  Weeks    Status  On-going       SLP Long Term Goals - 12/05/17 1730      SLP LONG TERM GOAL #1   Title  Pt will perform HEP for dysphagia with mod I    Time  5    Period  Weeks    Status  On-going      SLP LONG TERM GOAL #2   Title  Pt will follow diet modifications and swallow protocol with mod I over 2 sessions    Time  5    Period  Weeks    Status  On-going       Plan - 12/05/17 1728    Clinical Impression Statement  Pt arrived alone to ST today. Pt req'd rare min A with swallow precautions with dys III item and thin liquids. Rare min-mod A for HEP. Pt will need ongoing cues from family for precautions due to reduced memory. Pt would benefit from family participation in session to improve carryover at home. Continue skilled ST to reduce risk of aspiration pna.     Speech Therapy Frequency  2x / week    Treatment/Interventions  Aspiration precaution training;Environmental controls;Cueing hierarchy;Pharyngeal strengthening exercises;Diet toleration management by  SLP;Trials of upgraded texture/liquids;Internal/external aids;Patient/family education    Potential to Achieve Goals  Fair    Potential Considerations  Ability to learn/carryover information;Cooperation/participation level    Consulted and Agree with Plan of  Care  Patient       Patient will benefit from skilled therapeutic intervention in order to improve the following deficits and impairments:   Dysphagia, pharyngeal phase    Problem List Patient Active Problem List   Diagnosis Date Noted  . Bilateral impacted cerumen 11/22/2016  . Presbycusis of both ears 11/22/2016  . Pleural effusion, right 02/08/2016  . Acute on chronic diastolic CHF (congestive heart failure) ([place]) 02/08/2016  . Lower extremity edema 01/31/2016  . Chronic diastolic heart failure ([place]) 02/03/2015  . Hyponatremia 04/25/2014  . SOB (shortness of breath)   . Hypertensive urgency 04/24/2014  . Compression fracture 09/18/2013  . Knee pain 08/11/2013  . A-fib ([place]) 05/09/2013  . Back pain 05/09/2013  . HTN (hypertension)   . Hypothyroidism     Lycan Davee ,MS, CCC-SLP  12/05/2017, 5:32 PM  [facility] [address] [place], [place], [address] Phone: [phone]   Fax:  [phone]   Name: Cassandra Ray MRN: [id number] Date of Birth: 08/24/1922

## 2017-12-05 NOTE — Telephone Encounter (Signed)
Incoming refill request for [facility]. However upon reviewing her chart, it seems has transferred care to Dr Earlean Shawl. Please advise. Thank you.

## 2017-12-05 NOTE — Telephone Encounter (Signed)
I do not know whether or not they plan to follow with [facility], so I refilled it for now.

## 2017-12-06 ENCOUNTER — Encounter: Payer: Self-pay | Admitting: Physician Assistant

## 2017-12-10 ENCOUNTER — Ambulatory Visit: Payer: Medicare Other

## 2017-12-10 DIAGNOSIS — R1313 Dysphagia, pharyngeal phase: Secondary | ICD-10-CM

## 2017-12-10 NOTE — Therapy (Signed)
[facility] [address] [place], [place], [address] Phone: [phone]   Fax:  [phone]  Speech Language Pathology Treatment  Patient Details  Name: Cassandra Ray MRN: [id number] Date of Birth: 1922-09-19 Referring Provider: Wray Kearns, MD   Encounter Date: 12/10/2017  End of Session - 12/10/17 1624    Visit Number  9    Number of Visits  17    Date for SLP Re-Evaluation  01/25/18    SLP Start Time  9480 pt 13 minutes late due to road construction    SLP Stop Time   1534    SLP Time Calculation (min)  36 min    Activity Tolerance  Patient tolerated treatment well       Past Medical History:  Diagnosis Date  . Actinic keratoses   . Arthritis    back   . Atrial fibrillation ([place])    a. questionable episode in 2015 in [facility]. b. event monitor through December/January 2014/2015 which showed no evidence of atrial fibrillation.  . Breast cancer ([place])    Breast cancer (right)  . Chronic diastolic CHF (congestive heart failure) ([place])   . Compression fracture of L3 lumbar vertebra   . Dysphagia   . GERD (gastroesophageal reflux disease)    takes otc Pepcid as needed  . Hiatal hernia 11/27/2014  . HTN (hypertension)   . Hypothyroidism   . Insomnia   . Osteopenia   . Pleural effusion     Past Surgical History:  Procedure Laterality Date  . BREAST LUMPECTOMY Right 1995   lumpectomy  . COLONOSCOPY    . EYE SURGERY Bilateral    cataract w/ lens implant  . KYPHOPLASTY N/A 09/18/2013   Procedure: KYPHOPLASTY;  Surgeon: Sinclair Ship, MD;  Location: [facility];  Service: Orthopedics;  Laterality: N/A;  Lumbar 3 kyphoplasty  . TONSILLECTOMY      There were no vitals filed for this visit.  Subjective Assessment - 12/10/17 1501    Subjective  "Well, I've been doing them." (pt, re: HEP frequency)    Currently in Pain?  No/denies            ADULT SLP TREATMENT - 12/10/17 1502      General  Information   Behavior/Cognition  Alert;Cooperative;Pleasant mood      Dysphagia Treatment   Temperature Spikes Noted  No    Respiratory Status  Room air    Oral Cavity - Dentition  Adequate natural dentition    Treatment Methods  Skilled observation;Therapeutic exercise;Patient/caregiver education;Compensation strategy training    Patient observed directly with PO's  Yes    Type of PO's observed  Thin liquids;Dysphagia 3 (soft)    Pharyngeal Phase Signs & Symptoms  -- nothing noted    Type of cueing  Verbal    Amount of cueing  Modified independent    Other treatment/comments  Pt recalled 2 overt s/s aspiration without cues, 5 with min A. Performed HEP with rare min A. Pt ate/drank POs with assistance for double swallow.       Assessment / Recommendations / Plan   Plan  Continue with current plan of care      Dysphagia Recommendations   Diet recommendations  Dysphagia 3 (mechanical soft);Thin liquid    Liquids provided via  Cup;Straw    Medication Administration  Whole meds with puree    Supervision  Patient able to self feed;Intermittent supervision to cue for compensatory strategies    Compensations  Slow rate;Small sips/bites;Multiple dry swallows after each bite/sip;Clear throat intermittently;Effortful swallow    Postural Changes and/or Swallow Maneuvers  Upright 30-60 min after meal;Chin tuck;Seated upright 90 degrees         SLP Short Term Goals - 12/10/17 1627      SLP SHORT TERM GOAL #1   Title  Pt will follow diet modifications with rare min A over 2 sessions    Baseline  11/22/17    Status  Partially Met      SLP SHORT TERM GOAL #2   Title  Pt will complete HEP for dysphagia with occasional min A    Baseline  6/20;19, 11/29/17, 12/03/17    Status  Achieved      SLP SHORT TERM GOAL #3   Title  Pt/spouse will verbalize s/s of aspiration pna with rare min A over 2 sessions    Baseline  1 sign 12/03/17    Status  Partially Met       SLP Long Term Goals - 12/10/17  1628      SLP LONG TERM GOAL #1   Title  Pt will perform HEP for dysphagia with mod I    Time  4    Period  Weeks    Status  On-going      SLP LONG TERM GOAL #2   Title  Pt will follow diet modifications and swallow protocol with mod I over 2 sessions    Time  4    Period  Weeks    Status  On-going       Plan - 12/10/17 1624    Clinical Impression Statement  Pt with improving performance with HEP, and with swallow precautions, however does not have consistency required for discharge. Pt appears eager for d/c, however understands that SLP needs to verify her consistency with correct HEP procedure and incr'd independence with swallow precautions. She would cont to benefit from skilled ST to reduce risk of aspiration pna.     Speech Therapy Frequency  2x / week    Duration  -- 4 more weeks or 17 total sessions    Treatment/Interventions  Aspiration precaution training;Environmental controls;Cueing hierarchy;Pharyngeal strengthening exercises;Diet toleration management by SLP;Trials of upgraded texture/liquids;Internal/external aids;Patient/family education    Potential to Achieve Goals  Fair    Potential Considerations  Ability to learn/carryover information;Cooperation/participation level    Consulted and Agree with Plan of Care  Patient       Patient will benefit from skilled therapeutic intervention in order to improve the following deficits and impairments:   Dysphagia, pharyngeal phase    Problem List Patient Active Problem List   Diagnosis Date Noted  . Bilateral impacted cerumen 11/22/2016  . Presbycusis of both ears 11/22/2016  . Pleural effusion, right 02/08/2016  . Acute on chronic diastolic CHF (congestive heart failure) ([place]) 02/08/2016  . Lower extremity edema 01/31/2016  . Chronic diastolic heart failure ([place]) 02/03/2015  . Hyponatremia 04/25/2014  . SOB (shortness of breath)   . Hypertensive urgency 04/24/2014  . Compression fracture 09/18/2013  . Knee pain  08/11/2013  . A-fib ([place]) 05/09/2013  . Back pain 05/09/2013  . HTN (hypertension)   . Hypothyroidism     Eddi Hymes ,MS, CCC-SLP  12/10/2017, 4:29 PM  [facility] [address] [place], [place], [address] Phone: [phone]   Fax:  [phone]   Name: Cassandra Ray MRN: [id number] Date of Birth: [birth year]

## 2017-12-13 ENCOUNTER — Ambulatory Visit: Payer: Medicare Other | Admitting: Speech Pathology

## 2017-12-13 DIAGNOSIS — R1313 Dysphagia, pharyngeal phase: Secondary | ICD-10-CM

## 2017-12-13 NOTE — Therapy (Signed)
[facility] [address] [place], [place], [address] Phone: [phone]   Fax:  [phone]  Speech Language Pathology Treatment  Patient Details  Name: Cassandra Ray MRN: [id number] Date of Birth: April 08, 1923 Referring Provider: Wray Kearns, MD   Encounter Date: 12/13/2017  End of Session - 12/13/17 1437    Visit Number  10    Number of Visits  17    Date for SLP Re-Evaluation  01/25/18    SLP Start Time  68    SLP Stop Time   1430    SLP Time Calculation (min)  30 min    Activity Tolerance  Patient tolerated treatment well       Past Medical History:  Diagnosis Date  . Actinic keratoses   . Arthritis    back   . Atrial fibrillation ([place])    a. questionable episode in 2015 in [facility]. b. event monitor through December/January 2014/2015 which showed no evidence of atrial fibrillation.  . Breast cancer ([place])    Breast cancer (right)  . Chronic diastolic CHF (congestive heart failure) ([place])   . Compression fracture of L3 lumbar vertebra   . Dysphagia   . GERD (gastroesophageal reflux disease)    takes otc Pepcid as needed  . Hiatal hernia 11/27/2014  . HTN (hypertension)   . Hypothyroidism   . Insomnia   . Osteopenia   . Pleural effusion     Past Surgical History:  Procedure Laterality Date  . BREAST LUMPECTOMY Right 1995   lumpectomy  . COLONOSCOPY    . EYE SURGERY Bilateral    cataract w/ lens implant  . KYPHOPLASTY N/A 09/18/2013   Procedure: KYPHOPLASTY;  Surgeon: Sinclair Ship, MD;  Location: [facility];  Service: Orthopedics;  Laterality: N/A;  Lumbar 3 kyphoplasty  . TONSILLECTOMY      There were no vitals filed for this visit.         ADULT SLP TREATMENT - 12/13/17 1417      General Information   Behavior/Cognition  Alert;Cooperative;Pleasant mood      Treatment Provided   Treatment provided  Dysphagia      Dysphagia Treatment   Temperature Spikes Noted  No    Respiratory Status  Room air    Oral Cavity - Dentition  Adequate natural dentition    Treatment Methods  Skilled observation;Therapeutic exercise;Patient/caregiver education;Compensation strategy training    Patient observed directly with PO's  Yes    Type of PO's observed  Thin liquids;Dysphagia 3 (soft)    Feeding  Able to feed self    Liquids provided via  Cup    Pharyngeal Phase Signs & Symptoms  -- none    Type of cueing  Verbal    Amount of cueing  Modified independent    Other treatment/comments  Pt mod I with swallow protocol       SPEECH THERAPY DISCHARGE SUMMARY  Visits from Start of Care: 10  Current functional level related to goals / functional outcomes: See goals below   Remaining deficits: Pharyngeal dysphagia   Education / Equipment: HEP for dysphagia; diet modifications; swallow precautions Plan: Patient agrees to discharge.  Patient goals were partially met. Patient is being discharged due to the patient's request.  ?????    \      SLP Short Term Goals - 12/13/17 1434      SLP SHORT TERM GOAL #1   Title  Pt will follow diet modifications with rare min  A over 2 sessions    Baseline  11/22/17;    Status  Partially Met      SLP SHORT TERM GOAL #2   Title  Pt will complete HEP for dysphagia with occasional min A    Baseline  6/20;19, 11/29/17, 12/03/17    Status  Achieved      SLP SHORT TERM GOAL #3   Title  Pt/spouse will verbalize s/s of aspiration pna with rare min A over 2 sessions    Baseline  1 sign 12/03/17    Status  Partially Met       SLP Long Term Goals - 12/13/17 1434      SLP LONG TERM GOAL #1   Title  Pt will perform HEP for dysphagia with mod I    Time  4    Period  Weeks    Status  Partially Met      SLP LONG TERM GOAL #2   Title  Pt will follow diet modifications and swallow protocol with mod I over 2 sessions    Baseline   12/13/17 and at home per spouse report    Time  4    Period  Weeks    Status  Achieved       Plan -  12/13/17 1428    Clinical Impression Statement  Pt completing HEP for dysphagia with improved accuracy. She was mod I today with swallow precautions. Pt is repeatedly requesting d/c. I spoke with her husband, who does report that she is consistently carrying over her precaustions at home. He also reports she is doing her swallowing exercises at home. He denies any overt difficulty swallowing. Pt stated 2 s/s of aspiration pna with I, 4 with min ?ing cues. I agree to d/c with provision that pt will continue dysphagia HEP and precaustions and spouse will remind her when needed.     Speech Therapy Frequency  2x / week    Treatment/Interventions  Aspiration precaution training;Environmental controls;Cueing hierarchy;Pharyngeal strengthening exercises;Diet toleration management by SLP;Trials of upgraded texture/liquids;Internal/external aids;Patient/family education    Potential to Achieve Goals  Fair    Potential Considerations  Ability to learn/carryover information;Cooperation/participation level    Consulted and Agree with Plan of Care  Patient    Family Member Consulted  husband, Rush Landmark       Patient will benefit from skilled therapeutic intervention in order to improve the following deficits and impairments:   Dysphagia, pharyngeal phase    Problem List Patient Active Problem List   Diagnosis Date Noted  . Bilateral impacted cerumen 11/22/2016  . Presbycusis of both ears 11/22/2016  . Pleural effusion, right 02/08/2016  . Acute on chronic diastolic CHF (congestive heart failure) ([place]) 02/08/2016  . Lower extremity edema 01/31/2016  . Chronic diastolic heart failure ([place]) 02/03/2015  . Hyponatremia 04/25/2014  . SOB (shortness of breath)   . Hypertensive urgency 04/24/2014  . Compression fracture 09/18/2013  . Knee pain 08/11/2013  . A-fib ([place]) 05/09/2013  . Back pain 05/09/2013  . HTN (hypertension)   . Hypothyroidism     Lovvorn, Annye Rusk MS, CCC-SLP 12/13/2017, 2:39 PM  [facility] [address] [place], [place], [address] Phone: [phone]   Fax:  [phone]   Name: Cassandra Ray MRN: [id number] Date of Birth: 1923-05-14

## 2017-12-17 ENCOUNTER — Encounter: Payer: Medicare Other | Admitting: Speech Pathology

## 2017-12-27 ENCOUNTER — Encounter: Payer: Medicare Other | Admitting: Speech Pathology

## 2018-01-23 ENCOUNTER — Encounter

## 2018-01-23 ENCOUNTER — Ambulatory Visit: Payer: Self-pay | Admitting: Podiatry

## 2018-03-19 ENCOUNTER — Other Ambulatory Visit: Payer: Self-pay | Admitting: Gastroenterology

## 2018-04-30 ENCOUNTER — Telehealth: Payer: Self-pay | Admitting: Cardiology

## 2018-04-30 NOTE — Telephone Encounter (Signed)
Spoke with patient who states he just wanted to see if pt's appt could be moved up any more.  Advised of an appt at 8:20 am next week however this is too early in the day for her.  She will keep the appt as scheduled for now but I did place her on the wait list to be called if anything earlier opens up.  He was grateful for the call and information.

## 2018-04-30 NOTE — Telephone Encounter (Signed)
New message    Pt husband is calling for pt. He said that pt is very lethargic and worn out when she gets up in the morning. He said they called their PCP and they were advised to get a sooner appt with Dr. [name], moved appt from 12.30 to 12.17. Pt husband would like to talk to nurse.

## 2018-05-21 ENCOUNTER — Encounter: Payer: Self-pay | Admitting: Cardiology

## 2018-05-21 ENCOUNTER — Ambulatory Visit (INDEPENDENT_AMBULATORY_CARE_PROVIDER_SITE_OTHER): Payer: Medicare Other | Admitting: Cardiology

## 2018-05-21 ENCOUNTER — Encounter

## 2018-05-21 VITALS — BP 122/62 | HR 81 | Ht 64.0 in | Wt 174.8 lb

## 2018-05-21 DIAGNOSIS — Z8709 Personal history of other diseases of the respiratory system: Secondary | ICD-10-CM

## 2018-05-21 DIAGNOSIS — R5383 Other fatigue: Secondary | ICD-10-CM

## 2018-05-21 DIAGNOSIS — I5032 Chronic diastolic (congestive) heart failure: Secondary | ICD-10-CM

## 2018-05-21 MED ORDER — DILTIAZEM HCL ER COATED BEADS 180 MG PO CP24: 180.0000 mg | ORAL_CAPSULE | Freq: Every day | ORAL | 3 refills | Status: DC

## 2018-05-21 NOTE — Patient Instructions (Signed)
Medication Instructions:  1) STOP METOPROLOL (Lopressor) 2) START CARDIZEM CD 180 mg daily  Labwork: None  Testing/Procedures: None  Follow-Up: Your provider wants you to follow-up in: 6 months with Melina Copa, PA.  You will receive a reminder letter in the mail two months in advance. If you don't receive a letter, please call our office to schedule the follow-up appointment.

## 2018-05-21 NOTE — Progress Notes (Signed)
Cardiology Office Note:    Date:  05/21/2018   ID:  Cassandra Ray, DOB [birth year], MRN [id number]  PCP:  Lawerance Cruel, MD  Cardiologist:  Candee Furbish, MD  Electrophysiologist:  None   Referring MD: Lawerance Cruel, MD     History of Present Illness:    Cassandra Ray is a 84 y.o. female here for follow-up of diastolic dysfunction probable degree of diastolic heart failure, chronic hyponatremia pancreatic cyst breast cancer hypothyroidism.  In 2014 hospitalized in [place] with nausea vomiting weakness after eating seafood.  Stress test showed no ischemia.  EF normal.  Mild LVH.  Diagnosed with A. fib RVR IV Lopressor.  However upon close review of the strips there do appear to be P waves present.  Event monitor in 2015 showed no evidence of A. fib.  Admitted again in 2015 with shortness of breath and hypertensive urgency pulmonary edema.  Lasix.  Sodium at that time was 123.  Right pleural effusion was treated with Lasix.  Not enough fluid to tap.  Ongoing follow-up with GI diarrhea.  Lives at [facility]  Denies any falls, syncope, bleeding.  Past Medical History:  Diagnosis Date  . Actinic keratoses   . Arthritis    back   . Atrial fibrillation ([place])    a. questionable episode in 2015 in [facility]. b. event monitor through December/January 2014/2015 which showed no evidence of atrial fibrillation.  . Breast cancer ([place])    Breast cancer (right)  . Chronic diastolic CHF (congestive heart failure) ([place])   . Compression fracture of L3 lumbar vertebra   . Dysphagia   . GERD (gastroesophageal reflux disease)    takes otc Pepcid as needed  . Hiatal hernia 11/27/2014  . HTN (hypertension)   . Hypothyroidism   . Insomnia   . Osteopenia   . Pleural effusion     Past Surgical History:  Procedure Laterality Date  . BREAST LUMPECTOMY Right 1995   lumpectomy  . COLONOSCOPY    . EYE SURGERY Bilateral    cataract w/ lens implant  . KYPHOPLASTY N/A  09/18/2013   Procedure: KYPHOPLASTY;  Surgeon: Sinclair Ship, MD;  Location: [facility];  Service: Orthopedics;  Laterality: N/A;  Lumbar 3 kyphoplasty  . TONSILLECTOMY      Current Medications: Current Meds  Medication Sig  . b complex vitamins tablet Take 1 tablet by mouth daily.  . Calcium Citrate (CITRACAL PO) Take 1 tablet by mouth daily.  Marland Kitchen CALCIUM CITRATE-VITAMIN D3 PO Take by mouth.  . Cholecalciferol (VITAMIN D3) 2000 units TABS Take by mouth daily at 2 PM.  . CREON 36000 units CPEP capsule TAKE 2 CAPSULES WITH MEALS AND 1 CAPSULE WITH SNACKS.  . DULoxetine (CYMBALTA) 30 MG capsule Take 30 mg by mouth daily.   . Glucosamine-Chondroit-Vit C-Mn (GLUCOSAMINE 1500 COMPLEX PO) Take 1 capsule by mouth daily.  Marland Kitchen levothyroxine (SYNTHROID, LEVOTHROID) 112 MCG tablet Take 112 mcg by mouth daily before breakfast.  . loperamide (IMODIUM A-D) 2 MG tablet Take 2 mg by mouth as needed for diarrhea or loose stools.  Marland Kitchen losartan (COZAAR) 50 MG tablet Take 50 mg by mouth daily.  . Melatonin 5 MG TABS Take 1 tablet by mouth at bedtime as needed (sleep).  . nitroGLYCERIN (NITROSTAT) 0.4 MG SL tablet Place 0.4 mg under the tongue every 5 (five) minutes as needed for chest pain.  . pantoprazole (PROTONIX) 40 MG tablet Take 40 mg by mouth daily.  . Probiotic Product (ALIGN  PO) Take by mouth daily at 2 PM.  . SYMBICORT 80-4.5 MCG/ACT inhaler Take 2 puffs by mouth 2 (two) times daily.  . [DISCONTINUED] metoprolol (LOPRESSOR) 100 MG tablet Take 100 mg by mouth 2 (two) times daily.     Allergies:   Patient has no known allergies.   Social History   Socioeconomic History  . Marital status: Married    Spouse name: Not on file  . Number of children: Not on file  . Years of education: Not on file  . Highest education level: Not on file  Occupational History  . Not on file  Social Needs  . Financial resource strain: Not on file  . Food insecurity:    Worry: Not on file    Inability: Not on file    . Transportation needs:    Medical: Not on file    Non-medical: Not on file  Tobacco Use  . Smoking status: Former Research scientist (life sciences)  . Smokeless tobacco: Never Used  . Tobacco comment: only in college  Substance and Sexual Activity  . Alcohol use: Yes    Alcohol/week: 7.0 standard drinks    Types: 7 Glasses of wine per week    Comment: 1 glass of wine a day  . Drug use: No  . Sexual activity: Not on file  Lifestyle  . Physical activity:    Days per week: Not on file    Minutes per session: Not on file  . Stress: Not on file  Relationships  . Social connections:    Talks on phone: Not on file    Gets together: Not on file    Attends religious service: Not on file    Active member of club or organization: Not on file    Attends meetings of clubs or organizations: Not on file    Relationship status: Not on file  Other Topics Concern  . Not on file  Social History Narrative  . Not on file     Family History: The patient's family history includes CAD in her mother; Diabetes Mellitus II in her mother; Heart disease in her sister; Heart failure in her father and mother; Kidney cancer in her brother; Prostate cancer in her brother. There is no history of Esophageal cancer or Colon cancer.  ROS:   Please see the history of present illness.     All other systems reviewed and are negative.  EKGs/Labs/Other Studies Reviewed:    The following studies were reviewed today: Prior office notes EKG lab work  EKG:  EKG is  ordered today.  The ekg ordered today demonstrates sinus rhythm first-degree AV block 226 ms, heart rate 81 bpm, mildly low voltage EKG, poor R wave progression.  Personally reviewed.  Recent Labs: 11/28/2017: BUN 30; Creatinine, Ser 1.39; Potassium 4.6; Sodium 143  Recent Lipid Panel No results found for: CHOL, TRIG, HDL, CHOLHDL, VLDL, LDLCALC, LDLDIRECT  Physical Exam:    VS:  BP 122/62   Pulse 81   Ht 5\' 4"  (1.626 m)   Wt 174 lb 12.8 oz (79.3 kg)   LMP  (LMP  Unknown)   BMI 30.00 kg/m     Wt Readings from Last 3 Encounters:  05/21/18 174 lb 12.8 oz (79.3 kg)  11/28/17 171 lb 1.9 oz (77.6 kg)  08/28/17 177 lb 8 oz (80.5 kg)     GEN: Elderly Well nourished, well developed in no acute distress HEENT: Normal NECK: No JVD; No carotid bruits LYMPHATICS: No lymphadenopathy CARDIAC: RRR, no murmurs,  rubs, gallops RESPIRATORY:  Clear to auscultation without rales, wheezing or rhonchi  ABDOMEN: Soft, non-tender, non-distended MUSCULOSKELETAL:  1+ ankle B edema; No deformity  SKIN: Warm and dry NEUROLOGIC:  Alert and oriented x 3 PSYCHIATRIC:  Normal affect   ASSESSMENT:    1. Fatigue, unspecified type   2. Chronic diastolic CHF (congestive heart failure) ([place])   3. History of pleural effusion    PLAN:    In order of problems listed above:  Chronic diastolic heart failure - Overall fairly euvolemic.  Minimal ankle edema.  Lasix 20 mg once a day.  Understands to take extra Lasix if necessary according to weight.  Fatigue weakness - Likely a degree of deconditioning.  Utilizing a walker.  Was able to walk to the scale. - However, she is on a hefty dose of metoprolol 100 mg twice a day.  I will go ahead and change this over to diltiazem CD 180 mg once a day.  Perhaps eliminating the beta-blocker will help her to feel more energetic. -She is also at [facility] going to the exercise classes for 3 hours a week.  Asked her to continue to move, exercise.  Pleural effusion - Continuing with Lasix.  No current issues.  Dysphasia -Per GI.  81-month follow-up with Dayna, 1 year with me  Medication Adjustments/Labs and Tests Ordered: Current medicines are reviewed at length with the patient today.  Concerns regarding medicines are outlined above.  Orders Placed This Encounter  Procedures  . EKG 12-Lead   Meds ordered this encounter  Medications  . diltiazem (CARDIZEM CD) 180 MG 24 hr capsule    Sig: Take 1 capsule (180 mg total) by mouth  daily.    Dispense:  90 capsule    Refill:  3    Patient Instructions  Medication Instructions:  1) STOP METOPROLOL (Lopressor) 2) START CARDIZEM CD 180 mg daily  Labwork: None  Testing/Procedures: None  Follow-Up: Your provider wants you to follow-up in: 6 months with Melina Copa, PA.  You will receive a reminder letter in the mail two months in advance. If you don't receive a letter, please call our office to schedule the follow-up appointment.        Signed, Candee Furbish, MD  05/21/2018 2:47 PM    [facility]

## 2018-05-23 ENCOUNTER — Encounter (HOSPITAL_COMMUNITY): Payer: Self-pay

## 2018-05-23 ENCOUNTER — Observation Stay (HOSPITAL_COMMUNITY)
Admission: EM | Admit: 2018-05-23 | Discharge: 2018-05-25 | Disposition: A | Discharge status: Home / Self Care | Payer: Medicare Other | Attending: Internal Medicine | Admitting: Internal Medicine

## 2018-05-23 ENCOUNTER — Observation Stay (HOSPITAL_COMMUNITY): Payer: Medicare Other

## 2018-05-23 ENCOUNTER — Telehealth: Payer: Self-pay | Admitting: Cardiology

## 2018-05-23 ENCOUNTER — Other Ambulatory Visit: Payer: Self-pay

## 2018-05-23 ENCOUNTER — Emergency Department (HOSPITAL_COMMUNITY): Payer: Medicare Other

## 2018-05-23 DIAGNOSIS — J9601 Acute respiratory failure with hypoxia: Secondary | ICD-10-CM | POA: Diagnosis not present

## 2018-05-23 DIAGNOSIS — I4891 Unspecified atrial fibrillation: Secondary | ICD-10-CM | POA: Insufficient documentation

## 2018-05-23 DIAGNOSIS — I471 Supraventricular tachycardia: Secondary | ICD-10-CM | POA: Diagnosis not present

## 2018-05-23 DIAGNOSIS — Z79890 Hormone replacement therapy (postmenopausal): Secondary | ICD-10-CM | POA: Insufficient documentation

## 2018-05-23 DIAGNOSIS — Z853 Personal history of malignant neoplasm of breast: Secondary | ICD-10-CM | POA: Insufficient documentation

## 2018-05-23 DIAGNOSIS — Z87891 Personal history of nicotine dependence: Secondary | ICD-10-CM | POA: Diagnosis not present

## 2018-05-23 DIAGNOSIS — E039 Hypothyroidism, unspecified: Secondary | ICD-10-CM | POA: Insufficient documentation

## 2018-05-23 DIAGNOSIS — Z79899 Other long term (current) drug therapy: Secondary | ICD-10-CM | POA: Insufficient documentation

## 2018-05-23 DIAGNOSIS — R000 Tachycardia, unspecified: Secondary | ICD-10-CM | POA: Diagnosis present

## 2018-05-23 DIAGNOSIS — I5032 Chronic diastolic (congestive) heart failure: Secondary | ICD-10-CM | POA: Insufficient documentation

## 2018-05-23 DIAGNOSIS — R0602 Shortness of breath: Secondary | ICD-10-CM

## 2018-05-23 DIAGNOSIS — K219 Gastro-esophageal reflux disease without esophagitis: Secondary | ICD-10-CM | POA: Insufficient documentation

## 2018-05-23 LAB — BASIC METABOLIC PANEL
Anion gap: 16 — ABNORMAL HIGH (ref 5–15)
BUN: 37 mg/dL — ABNORMAL HIGH (ref 8–23)
CALCIUM: 9.4 mg/dL (ref 8.9–10.3)
CO2: 19 mmol/L — ABNORMAL LOW (ref 22–32)
Chloride: 103 mmol/L (ref 98–111)
Creatinine, Ser: 1.53 mg/dL — ABNORMAL HIGH (ref 0.44–1.00)
GFR calc Af Amer: 34 mL/min — ABNORMAL LOW (ref >60)
GFR calc non Af Amer: 29 mL/min — ABNORMAL LOW (ref >60)
Glucose, Bld: 127 mg/dL — ABNORMAL HIGH (ref 70–99)
Potassium: 3.1 mmol/L — ABNORMAL LOW (ref 3.5–5.1)
Sodium: 138 mmol/L (ref 135–145)

## 2018-05-23 LAB — CBC WITH DIFFERENTIAL/PLATELET
Abs Immature Granulocytes: 0.04 10*3/uL (ref 0.00–0.07)
BASOS PCT: 0 %
Basophils Absolute: 0.1 10*3/uL (ref 0.0–0.1)
Eosinophils Absolute: 0.4 10*3/uL (ref 0.0–0.5)
Eosinophils Relative: 3 %
HCT: 40.1 % (ref 36.0–46.0)
Hemoglobin: 12.8 g/dL (ref 12.0–15.0)
Immature Granulocytes: 0 %
Lymphocytes Relative: 33 %
Lymphs Abs: 3.7 10*3/uL (ref 0.7–4.0)
MCH: 30.7 pg (ref 26.0–34.0)
MCHC: 31.9 g/dL (ref 30.0–36.0)
MCV: 96.2 fL (ref 80.0–100.0)
Monocytes Absolute: 1.9 10*3/uL — ABNORMAL HIGH (ref 0.1–1.0)
Monocytes Relative: 16 %
Neutro Abs: 5.4 10*3/uL (ref 1.7–7.7)
Neutrophils Relative %: 48 %
Platelets: 192 10*3/uL (ref 150–400)
RBC: 4.17 MIL/uL (ref 3.87–5.11)
RDW: 12.9 % (ref 11.5–15.5)
WBC: 11.4 10*3/uL — ABNORMAL HIGH (ref 4.0–10.5)
nRBC: 0 % (ref 0.0–0.2)

## 2018-05-23 LAB — I-STAT VENOUS BLOOD GAS, ED
ACID-BASE DEFICIT: 2 mmol/L (ref 0.0–2.0)
Bicarbonate: 21.3 mmol/L (ref 20.0–28.0)
O2 Saturation: 89 %
PH VEN: 7.437 — AB (ref 7.250–7.430)
TCO2: 22 mmol/L (ref 22–32)
pCO2, Ven: 31.5 mmHg — ABNORMAL LOW (ref 44.0–60.0)
pO2, Ven: 54 mmHg — ABNORMAL HIGH (ref 32.0–45.0)

## 2018-05-23 LAB — D-DIMER, QUANTITATIVE: D-Dimer, Quant: 2.62 ug/mL-FEU — ABNORMAL HIGH (ref 0.00–0.50)

## 2018-05-23 LAB — BRAIN NATRIURETIC PEPTIDE: B Natriuretic Peptide: 146.6 pg/mL — ABNORMAL HIGH (ref 0.0–100.0)

## 2018-05-23 LAB — I-STAT TROPONIN, ED: Troponin i, poc: 0.01 ng/mL (ref 0.00–0.08)

## 2018-05-23 IMAGING — DX DG CHEST 1V PORT
1 series · 1 of 1 positions shown · non-contrast
Comparison: [DATE]

CLINICAL DATA: Short of breath.  History of breast cancer

EXAM:
PORTABLE CHEST 1 VIEW

[chest]
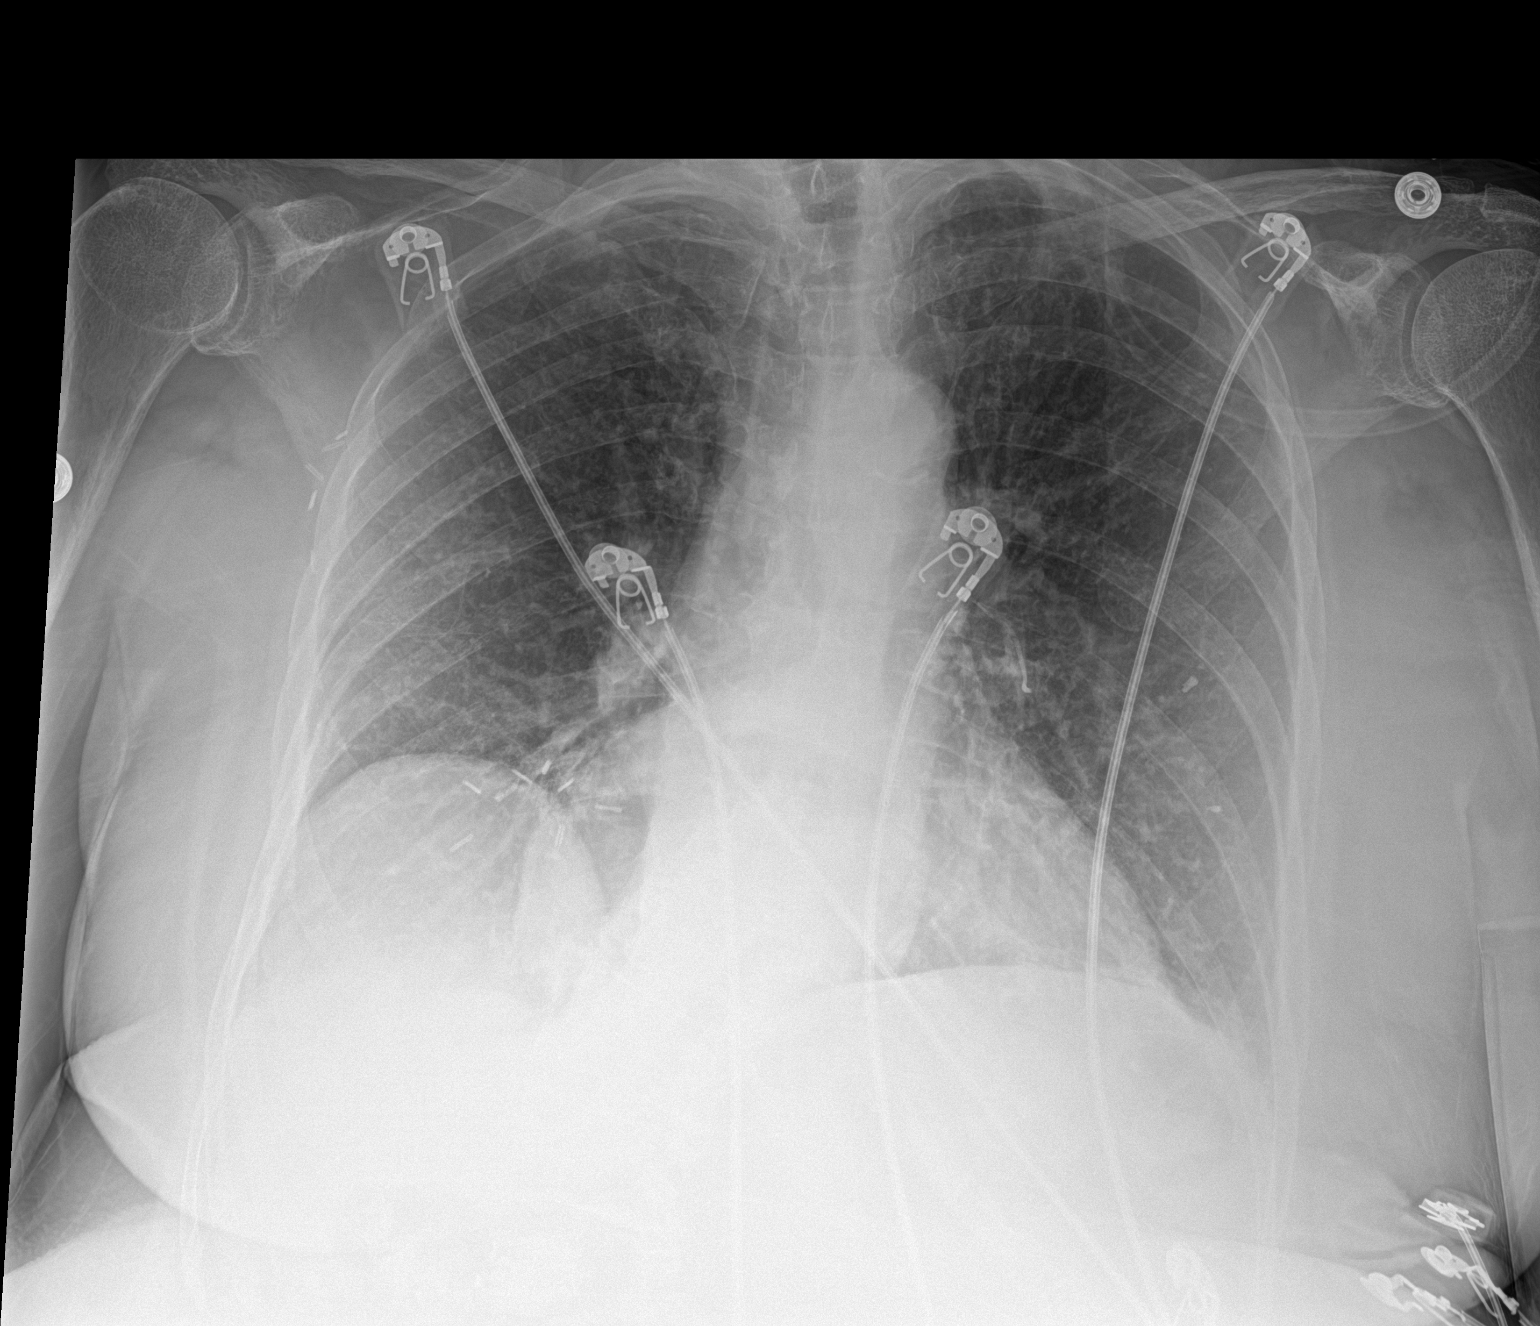

[1 of 1 positions shown; findings below may reference images not displayed]

FINDINGS: Chronic elevation right hemidiaphragm. Chronic scarring or
atelectasis in the right lung base

Negative for heart failure or pneumonia. Mild left lower lobe
atelectasis

Surgical clips in the right breast and right axilla
IMPRESSION: Mild bibasilar atelectasis.  Chronic elevation right hemidiaphragm.

## 2018-05-23 IMAGING — NM NM PULMONARY VENT & PERF
16 series · 16 of 16 positions shown · non-contrast
Comparison: Chest radiograph [DATE]

CLINICAL DATA: Shortness of breath and elevated D-dimer.

EXAM:
NUCLEAR MEDICINE VENTILATION - PERFUSION LUNG SCAN
TECHNIQUE: Ventilation images were obtained in multiple projections using
inhaled aerosol [8M] DTPA. Perfusion images were obtained in
multiple projections after intravenous injection of [8M] MAA.
RADIOPHARMACEUTICALS:  30 mCi of [8M] DTPA aerosol inhalation and
4 mCi [8M] MAA IV

[Series 1: ant/post vent · 4.14mm/px · 1 of 1 slices shown (1 of 2)]
[im 1/1]
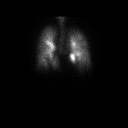

[Series 1: ant/post vent · 4.14mm/px · 1 of 1 slices shown (2 of 2)]
[im 1/1]
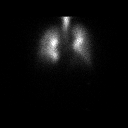

[Series 2: lao/rpo vent · 4.14mm/px · 1 of 1 slices shown (1 of 2)]
[im 1/1]
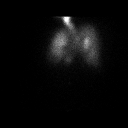

[Series 2: lao/rpo vent · 4.14mm/px · 1 of 1 slices shown (2 of 2)]
[im 1/1]
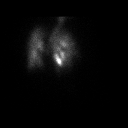

[Series 3: lpo/rao vent · 4.14mm/px · 1 of 1 slices shown (1 of 2)]
[im 1/1]
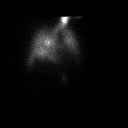

[Series 3: lpo/rao vent · 4.14mm/px · 1 of 1 slices shown (2 of 2)]
[im 1/1]
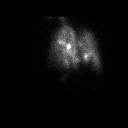

[Series 4: lt lat/rt lat vent · 4.14mm/px · 1 of 1 slices shown (1 of 2)]
[im 1/1]
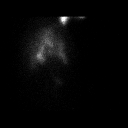

[Series 4: lt lat/rt lat vent · 4.14mm/px · 1 of 1 slices shown (2 of 2)]
[im 1/1]
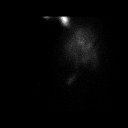

[Series 5: lt lat/rt lat perf · 4.14mm/px · 1 of 1 slices shown (1 of 2)]
[im 1/1]
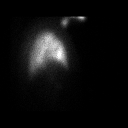

[Series 5: lt lat/rt lat perf · 4.14mm/px · 1 of 1 slices shown (2 of 2)]
[im 1/1]
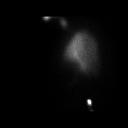

[Series 6: lpo/rao perf · 4.14mm/px · 1 of 1 slices shown (1 of 2)]
[im 1/1]
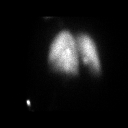

[Series 6: lpo/rao perf · 4.14mm/px · 1 of 1 slices shown (2 of 2)]
[im 1/1]
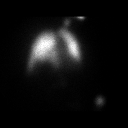

[Series 7: ant/post perf · 4.14mm/px · 1 of 1 slices shown (1 of 2)]
[im 1/1]
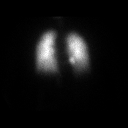

[Series 7: ant/post perf · 4.14mm/px · 1 of 1 slices shown (2 of 2)]
[im 1/1]
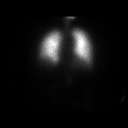

[Series 8: lao/rpo perf · 4.14mm/px · 1 of 1 slices shown (1 of 2)]
[im 1/1]
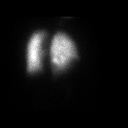

[Series 8: lao/rpo perf · 4.14mm/px · 1 of 1 slices shown (2 of 2)]
[im 1/1]
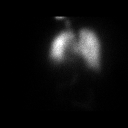

[16 of 16 positions shown; findings below may reference images not displayed]

FINDINGS: Ventilation: Mostly homogeneous distribution tracer activity
throughout the lungs with residual tracer pooling in the central
bronchi.

Perfusion: Homogeneous distribution of tracer activity throughout
the lungs without focal defect.
IMPRESSION: Very low probability of pulmonary embolus.

## 2018-05-23 MED ORDER — IPRATROPIUM-ALBUTEROL 0.5-2.5 (3) MG/3ML IN SOLN
3.0000 mL | Freq: Once | RESPIRATORY_TRACT | Status: AC
  Administered 2018-05-23: 3 mL via RESPIRATORY_TRACT
  Filled 2018-05-23: qty 3

## 2018-05-23 MED ORDER — TECHNETIUM TC 99M DIETHYLENETRIAME-PENTAACETIC ACID
30.0000 | Freq: Once | INTRAVENOUS | Status: AC | PRN
  Administered 2018-05-23: 30 via RESPIRATORY_TRACT

## 2018-05-23 MED ORDER — TECHNETIUM TO 99M ALBUMIN AGGREGATED
4.0000 | Freq: Once | INTRAVENOUS | Status: AC | PRN
  Administered 2018-05-23: 4 via INTRAVENOUS

## 2018-05-23 MED ORDER — LACTATED RINGERS IV BOLUS
1000.0000 mL | Freq: Once | INTRAVENOUS | Status: AC
  Administered 2018-05-23: 1000 mL via INTRAVENOUS

## 2018-05-23 MED ORDER — HEPARIN (PORCINE) 25000 UT/250ML-% IV SOLN
1200.0000 [IU]/h | INTRAVENOUS | Status: DC
  Administered 2018-05-23: 1200 [IU]/h via INTRAVENOUS
  Filled 2018-05-23: qty 250

## 2018-05-23 MED ORDER — HEPARIN BOLUS VIA INFUSION
4000.0000 [IU] | Freq: Once | INTRAVENOUS | Status: AC
  Administered 2018-05-23: 4000 [IU] via INTRAVENOUS
  Filled 2018-05-23: qty 4000

## 2018-05-23 NOTE — Progress Notes (Signed)
ANTICOAGULATION CONSULT NOTE - Initial Consult  Pharmacy Consult for heparin Indication: r/o PE  Allergies  Allergen Reactions  . Tape Other (See Comments)    Patient prefers easy-release tape    Patient Measurements: Height: 5\' 4"  (162.6 cm) Weight: 174 lb 9.7 oz (79.2 kg) IBW/kg (Calculated) : 54.7 Heparin Dosing Weight: 71.6kg  Vital Signs: BP: 128/62 (12/19 1745) Pulse Rate: 110 (12/19 1745)  Labs: Recent Labs    05/23/18 1608  HGB 12.8  HCT 40.1  PLT 192  CREATININE 1.53*    Estimated Creatinine Clearance: 23.4 mL/min (A) (by C-G formula based on SCr of 1.53 mg/dL (H)).   Medical History: Past Medical History:  Diagnosis Date  . Actinic keratoses   . Arthritis    back   . Atrial fibrillation ([name])    a. questionable episode in 2015 in [facility]. b. event monitor through December/January 2014/2015 which showed no evidence of atrial fibrillation.  . Breast cancer ([name])    Breast cancer (right)  . Chronic diastolic CHF (congestive heart failure) ([name])   . Compression fracture of L3 lumbar vertebra   . Dysphagia   . GERD (gastroesophageal reflux disease)    takes otc Pepcid as needed  . Hiatal hernia 11/27/2014  . HTN (hypertension)   . Hypothyroidism   . Insomnia   . Osteopenia   . Pleural effusion     Medications:  Infusions:  . heparin    . lactated ringers 1,000 mL (05/23/18 1942)    Assessment: 76 yof presented to the ED with SOB. D-dimer elevated and now starting IV heparin for r/o possible PE. Baseline CBC is WNL and she is not on anticoagulation PTA. VQ scan is pending.   Goal of Therapy:  Heparin level 0.3-0.7 units/ml Monitor platelets by anticoagulation protocol: Yes   Plan:  Heparin bolus 4000 units IV x 1 Heparin gtt 1200 units/hr Check an 8 hr heparin level Daily heparin level and CBC F/u VQ scan  Cassandra Ray, Cassandra Ray 05/23/2018,8:03 PM

## 2018-05-23 NOTE — ED Provider Notes (Signed)
[facility] EMERGENCY DEPARTMENT Provider Note   CSN: [id number] Arrival date & time: 05/23/18  1600     History   Chief Complaint Chief Complaint  Patient presents with  . Shortness of Breath    HPI Cassandra Ray is a 84 y.o. female.  The history is provided by the patient. No language interpreter was used.  Shortness of Breath  This is a new problem. The problem occurs rarely.The current episode started less than 1 hour ago. The problem has not changed since onset.Pertinent negatives include no fever, no headaches, no coryza, no rhinorrhea, no sore throat, no swollen glands, no ear pain, no neck pain, no cough, no sputum production, no hemoptysis, no wheezing, no PND, no orthopnea, no chest pain, no syncope, no vomiting, no abdominal pain, no rash, no leg pain, no leg swelling and no claudication. She has tried nothing for the symptoms. Associated medical issues include heart failure ( diastolic). Associated medical issues do not include asthma, COPD, chronic lung disease, PE, CAD, past MI, DVT or recent surgery.   Patient received a DuoNeb and albuterol treatment via EMS prior to arrival.  Past Medical History:  Diagnosis Date  . Actinic keratoses   . Arthritis    back   . Atrial fibrillation ([place])    a. questionable episode in 2015 in [facility]. b. event monitor through December/January 2014/2015 which showed no evidence of atrial fibrillation.  . Breast cancer ([place])    Breast cancer (right)  . Chronic diastolic CHF (congestive heart failure) ([place])   . Compression fracture of L3 lumbar vertebra   . Dysphagia   . GERD (gastroesophageal reflux disease)    takes otc Pepcid as needed  . Hiatal hernia 11/27/2014  . HTN (hypertension)   . Hypothyroidism   . Insomnia   . Osteopenia   . Pleural effusion     Patient Active Problem List   Diagnosis Date Noted  . Acute respiratory failure with hypoxia ([place]) 05/23/2018  . Bilateral impacted cerumen 11/22/2016    . Presbycusis of both ears 11/22/2016  . Pleural effusion, right 02/08/2016  . Acute on chronic diastolic CHF (congestive heart failure) ([place]) 02/08/2016  . Lower extremity edema 01/31/2016  . Chronic diastolic heart failure ([place]) 02/03/2015  . Hyponatremia 04/25/2014  . SOB (shortness of breath)   . Hypertensive urgency 04/24/2014  . Compression fracture 09/18/2013  . Knee pain 08/11/2013  . A-fib ([place]) 05/09/2013  . Back pain 05/09/2013  . HTN (hypertension)   . Hypothyroidism     Past Surgical History:  Procedure Laterality Date  . BREAST LUMPECTOMY Right 1995   lumpectomy  . COLONOSCOPY    . EYE SURGERY Bilateral    cataract w/ lens implant  . KYPHOPLASTY N/A 09/18/2013   Procedure: KYPHOPLASTY;  Surgeon: Sinclair Ship, MD;  Location: [facility];  Service: Orthopedics;  Laterality: N/A;  Lumbar 3 kyphoplasty  . TONSILLECTOMY       OB History   No obstetric history on file.      Home Medications    Prior to Admission medications   Medication Sig Start Date End Date Taking? Authorizing Provider  acetaminophen (TYLENOL) 500 MG tablet Take 500-1,000 mg by mouth every 8 (eight) hours as needed for mild pain or headache.   Yes [provider]  B Complex-C (B-COMPLEX WITH VITAMIN C) tablet Take 1 tablet by mouth daily.   Yes [provider]  Calcium-Phosphorus-Vitamin D (CITRACAL +D3 PO) Take 1 tablet by mouth  daily.   Yes [provider]  Cholecalciferol (VITAMIN D3) 2000 units TABS Take 2,000 Units by mouth daily.    Yes [provider]  CREON 36000 units CPEP capsule TAKE 2 CAPSULES WITH MEALS AND 1 CAPSULE WITH SNACKS. Patient taking differently: Take 72,000 Units by mouth 3 (three) times daily with meals.  03/19/18  Yes Danis, Kirke Corin, MD  diltiazem (CARDIZEM CD) 180 MG 24 hr capsule Take 1 capsule (180 mg total) by mouth daily. 05/21/18 05/16/19 Yes Jerline Pain, MD  furosemide (LASIX) 20 MG tablet Take 1 tablet (20 mg  total) by mouth daily. 11/28/17 05/23/18 Yes Dunn, Dayna N, PA-C  Glucosamine HCl 1500 MG TABS Take 1,500 mg by mouth 2 (two) times daily.    Yes [provider]  levothyroxine (SYNTHROID, LEVOTHROID) 112 MCG tablet Take 112 mcg by mouth daily before breakfast.   Yes [provider]  loperamide (IMODIUM A-D) 2 MG tablet Take 2 mg by mouth as needed for diarrhea or loose stools.   Yes [provider]  losartan (COZAAR) 50 MG tablet Take 50 mg by mouth daily.   Yes [provider]  Melatonin 5 MG TABS Take 5 mg by mouth at bedtime as needed (for sleep).    Yes [provider]  nitroGLYCERIN (NITROSTAT) 0.4 MG SL tablet Place 0.4 mg under the tongue every 5 (five) minutes as needed for chest pain.   Yes [provider]  pantoprazole (PROTONIX) 40 MG tablet Take 40 mg by mouth daily.   Yes [provider]  Probiotic Product (ALIGN) 4 MG CAPS Take 4 mg by mouth daily.   Yes [provider]  SYMBICORT 80-4.5 MCG/ACT inhaler Take 2 puffs by mouth every morning.  12/09/14  Yes [provider]    Family History Family History  Problem Relation Age of Onset  . CAD Mother   . Diabetes Mellitus II Mother   . Heart failure Mother   . Heart failure Father   . Prostate cancer Brother   . Kidney cancer Brother   . Heart disease Sister   . Esophageal cancer Neg Hx   . Colon cancer Neg Hx     Social History Social History   Tobacco Use  . Smoking status: Former Research scientist (life sciences)  . Smokeless tobacco: Never Used  . Tobacco comment: only in college  Substance Use Topics  . Alcohol use: Yes    Alcohol/week: 7.0 standard drinks    Types: 7 Glasses of wine per week    Comment: 1 glass of wine a day  . Drug use: No     Allergies   Tape   Review of Systems Review of Systems  Constitutional: Negative for chills and fever.  HENT: Negative for ear pain, rhinorrhea and sore throat.   Eyes: Negative for pain and visual  disturbance.  Respiratory: Positive for shortness of breath. Negative for cough, hemoptysis, sputum production and wheezing.   Cardiovascular: Negative for chest pain, palpitations, orthopnea, claudication, leg swelling, syncope and PND.  Gastrointestinal: Negative for abdominal pain and vomiting.  Genitourinary: Negative for dysuria and hematuria.  Musculoskeletal: Negative for arthralgias, back pain and neck pain.  Skin: Negative for color change and rash.  Neurological: Negative for seizures, syncope and headaches.  All other systems reviewed and are negative.    Physical Exam Updated Vital Signs BP (!) 152/80   Pulse (!) 130   Resp 18   Ht 5\' 4"  (1.626 m)   Wt 79.2  kg   LMP  (LMP Unknown)   SpO2 100%   BMI 29.97 kg/m   Physical Exam Vitals signs and nursing note reviewed.  Constitutional:      General: She is not in acute distress.    Appearance: She is well-developed.  HENT:     Head: Normocephalic and atraumatic.     Mouth/Throat:     Mouth: Mucous membranes are moist.  Eyes:     Conjunctiva/sclera: Conjunctivae normal.     Pupils: Pupils are equal, round, and reactive to light.  Neck:     Musculoskeletal: Neck supple.  Cardiovascular:     Rate and Rhythm: Regular rhythm. Tachycardia present.     Pulses: Normal pulses.     Heart sounds: No murmur.  Pulmonary:     Effort: Pulmonary effort is normal. Tachypnea present. No respiratory distress.  Abdominal:     Palpations: Abdomen is soft.     Tenderness: There is no abdominal tenderness.  Skin:    General: Skin is warm and dry.  Neurological:     Mental Status: She is alert.      ED Treatments / Results  Labs (all labs ordered are listed, but only abnormal results are displayed) Labs Reviewed  CBC WITH DIFFERENTIAL/PLATELET - Abnormal; Notable for the following components:      Result Value   WBC 11.4 (*)    Monocytes Absolute 1.9 (*)    All other components within normal limits  BASIC METABOLIC  PANEL - Abnormal; Notable for the following components:   Potassium 3.1 (*)    CO2 19 (*)    Glucose, Bld 127 (*)    BUN 37 (*)    Creatinine, Ser 1.53 (*)    GFR calc non Af Amer 29 (*)    GFR calc Af Amer 34 (*)    Anion gap 16 (*)    All other components within normal limits  BRAIN NATRIURETIC PEPTIDE - Abnormal; Notable for the following components:   B Natriuretic Peptide 146.6 (*)    All other components within normal limits  D-DIMER, QUANTITATIVE (NOT AT [facility]) - Abnormal; Notable for the following components:   D-Dimer, Quant 2.62 (*)    All other components within normal limits  I-STAT VENOUS BLOOD GAS, ED - Abnormal; Notable for the following components:   pH, Ven 7.437 (*)    pCO2, Ven 31.5 (*)    pO2, Ven 54.0 (*)    All other components within normal limits  HEPARIN LEVEL (UNFRACTIONATED)  CBC  I-STAT TROPONIN, ED    EKG EKG Interpretation  Date/Time:  Thursday May 23 2018 16:04:02 EST Ventricular Rate:  145 PR Interval:    QRS Duration: 92 QT Interval:  328 QTC Calculation: 510 R Axis:   -39 Text Interpretation:  Supraventricular tachycardia Left axis deviation Low voltage, precordial leads Consider anterior infarct ST depression, probably rate related No STEMI.  Confirmed by Nanda Quinton [id number]) on 05/23/2018 4:48:32 PM   Radiology Nm Pulmonary Vent And Perf (v/q Scan)  Result Date: 05/23/2018 CLINICAL DATA:  Shortness of breath and elevated D-dimer. EXAM: NUCLEAR MEDICINE VENTILATION - PERFUSION LUNG SCAN TECHNIQUE: Ventilation images were obtained in multiple projections using inhaled aerosol Tc-23m DTPA. Perfusion images were obtained in multiple projections after intravenous injection of Tc-45m MAA. RADIOPHARMACEUTICALS:  30 mCi of Tc-62m DTPA aerosol inhalation and 4 mCi Tc53m MAA IV COMPARISON:  Chest radiograph 05/23/2018 FINDINGS: Ventilation: Mostly homogeneous distribution tracer activity throughout the lungs with residual tracer pooling in the  central bronchi. Perfusion: Homogeneous distribution of tracer activity throughout the lungs without focal defect. IMPRESSION: Very low probability of pulmonary embolus. Electronically Signed   By: Lucienne Capers M.D.   On: 05/23/2018 23:06   Dg Chest Portable 1 View  Result Date: 05/23/2018 CLINICAL DATA:  Short of breath.  History of breast cancer EXAM: PORTABLE CHEST 1 VIEW COMPARISON:  02/01/2016 FINDINGS: Chronic elevation right hemidiaphragm. Chronic scarring or atelectasis in the right lung base Negative for heart failure or pneumonia. Mild left lower lobe atelectasis Surgical clips in the right breast and right axilla IMPRESSION: Mild bibasilar atelectasis.  Chronic elevation right hemidiaphragm. Electronically Signed   By: Franchot Gallo M.D.   On: 05/23/2018 16:29    Procedures Procedures (including critical care time)  Medications Ordered in ED Medications  heparin ADULT infusion 100 units/mL (25000 units/265mL sodium chloride 0.45%) (1,200 Units/hr Intravenous New Bag/Given 05/23/18 2030)  ipratropium-albuterol (DUONEB) 0.5-2.5 (3) MG/3ML nebulizer solution 3 mL (3 mLs Nebulization Given 05/23/18 1609)  lactated ringers bolus 1,000 mL (1,000 mLs Intravenous New Bag/Given 05/23/18 1942)  heparin bolus via infusion 4,000 Units (4,000 Units Intravenous Bolus from Bag 05/23/18 2030)  technetium TC 12M diethylenetriame-pentaacetic acid (DTPA) injection 30 millicurie (30 millicuries Inhalation Given 05/23/18 2251)  technetium albumin aggregated (MAA) injection solution 4 millicurie (4 millicuries Intravenous Contrast Given 05/23/18 2251)     Initial Impression / Assessment and Plan / ED Course  I have reviewed the triage vital signs and the nursing notes.  Pertinent labs & imaging results that were available during my care of the patient were reviewed by me and considered in my medical decision making (see chart for details).     Patient is a 84 year old female who presents with  above stated history exam for evaluation of acute onset dyspnea that began immediately prior to arrival. On presentation patient is noted to be tachycardic in the 120s, Neck, with otherwise stable vital signs. Exam as above. Doubt ACS as patient denies chest pain and given EKG with rate of 110 with no other signs of acute ischemic change. In addition, troponin is noted to be 0.01. However patient was given ASA.doubt hypoxic respiratory failure given SPO2 greater than 95% on room air. Doubt acute hypercarbic respiratory failure given the ABG showing pH of 7.4 with a PCO2 of 31.5. Chest x-ray does not show focal consolidation to suggest pneumonia or pleural effusions or edema to suggest CHF.in addition BNP is 146.6.concern for dimer given history and d-dimer of 2.6.CBC shows WBC 11.4, hemoglobin 12.8, platelets 192.BMP remarkable for hypokalemia with a potassium of 3.1, CO2 of 19, glucose 127, and GFR 34. Given low GFR CTA was deferred and a VQ scan was ordered.  Heparin therapy was started for concern for a PE.  Plan is to admit patient to hospitalist.   Final Clinical Impressions(s) / ED Diagnoses   Final diagnoses:  Shortness of breath    ED Discharge Orders    None       Hulan Saas, MD 05/23/18 2317    Margette Fast, MD 05/24/18 [phone]

## 2018-05-23 NOTE — ED Notes (Signed)
Transported to [facility]

## 2018-05-23 NOTE — ED Triage Notes (Signed)
Pt brought in by EMS due to having SOB. Pt on cpap upon arrival. Pt received nitrox2 and 5mg  albuterol enroute. Pt denies fevers, cough, or urinary symptoms.

## 2018-05-23 NOTE — Telephone Encounter (Signed)
New Message        Pt c/o Shortness Of Breath: STAT if SOB developed within the last 24 hours or pt is noticeably SOB on the phone  1. Are you currently SOB (can you hear that pt is SOB on the phone)? No, daughter called  2. How long have you been experiencing SOB? 72 hours  3. Are you SOB when sitting or when up moving around? Both  4. Are you currently experiencing any other symptoms?  Wheezing/ fluid in lungs/HR is high/Low oxygen  [name] [phone] @ [facility] is the nurse there. Per patient's daughter her medication has being changed and they feel it may have something to with it. EMS has being called to take patient to [facility].

## 2018-05-23 NOTE — ED Notes (Signed)
Paged [facility]/to call [facility]

## 2018-05-23 NOTE — Telephone Encounter (Signed)
Pt presently in the ED for evaluation of SOB.  CXR - negative for CHF or pneumonia. Labs pending.

## 2018-05-24 ENCOUNTER — Other Ambulatory Visit: Payer: Self-pay

## 2018-05-24 ENCOUNTER — Encounter (HOSPITAL_COMMUNITY): Payer: Self-pay | Admitting: Internal Medicine

## 2018-05-24 DIAGNOSIS — R0602 Shortness of breath: Secondary | ICD-10-CM

## 2018-05-24 DIAGNOSIS — I5032 Chronic diastolic (congestive) heart failure: Secondary | ICD-10-CM

## 2018-05-24 DIAGNOSIS — J9601 Acute respiratory failure with hypoxia: Principal | ICD-10-CM

## 2018-05-24 DIAGNOSIS — R000 Tachycardia, unspecified: Secondary | ICD-10-CM | POA: Diagnosis present

## 2018-05-24 LAB — CBC
HCT: 33.9 % — ABNORMAL LOW (ref 36.0–46.0)
HCT: 33.1 % — ABNORMAL LOW (ref 36.0–46.0)
Hemoglobin: 11.7 g/dL — ABNORMAL LOW (ref 12.0–15.0)
Hemoglobin: 11.2 g/dL — ABNORMAL LOW (ref 12.0–15.0)
MCH: 32.2 pg (ref 26.0–34.0)
MCH: 31.8 pg (ref 26.0–34.0)
MCHC: 34.5 g/dL (ref 30.0–36.0)
MCHC: 33.8 g/dL (ref 30.0–36.0)
MCV: 93.4 fL (ref 80.0–100.0)
MCV: 94 fL (ref 80.0–100.0)
Platelets: 188 10*3/uL (ref 150–400)
Platelets: 182 10*3/uL (ref 150–400)
RBC: 3.63 MIL/uL — ABNORMAL LOW (ref 3.87–5.11)
RBC: 3.52 MIL/uL — ABNORMAL LOW (ref 3.87–5.11)
RDW: 12.9 % (ref 11.5–15.5)
RDW: 13.2 % (ref 11.5–15.5)
WBC: 10.7 10*3/uL — ABNORMAL HIGH (ref 4.0–10.5)
WBC: 8.5 10*3/uL (ref 4.0–10.5)
nRBC: 0 % (ref 0.0–0.2)
nRBC: 0 % (ref 0.0–0.2)

## 2018-05-24 LAB — TROPONIN I
Troponin I: 0.1 ng/mL (ref <0.03)
Troponin I: 0.09 ng/mL (ref <0.03)
Troponin I: 0.08 ng/mL (ref <0.03)

## 2018-05-24 LAB — BASIC METABOLIC PANEL
ANION GAP: 11 (ref 5–15)
BUN: 27 mg/dL — ABNORMAL HIGH (ref 8–23)
CO2: 21 mmol/L — ABNORMAL LOW (ref 22–32)
Calcium: 9 mg/dL (ref 8.9–10.3)
Chloride: 108 mmol/L (ref 98–111)
Creatinine, Ser: 1.37 mg/dL — ABNORMAL HIGH (ref 0.44–1.00)
GFR calc Af Amer: 38 mL/min — ABNORMAL LOW (ref >60)
GFR calc non Af Amer: 33 mL/min — ABNORMAL LOW (ref >60)
GLUCOSE: 110 mg/dL — AB (ref 70–99)
Potassium: 3.8 mmol/L (ref 3.5–5.1)
Sodium: 140 mmol/L (ref 135–145)

## 2018-05-24 LAB — HEPATIC FUNCTION PANEL
ALT: 17 U/L (ref 0–44)
AST: 26 U/L (ref 15–41)
Albumin: 2.9 g/dL — ABNORMAL LOW (ref 3.5–5.0)
Alkaline Phosphatase: 59 U/L (ref 38–126)
BILIRUBIN DIRECT: 0.1 mg/dL (ref 0.0–0.2)
Indirect Bilirubin: 0.7 mg/dL (ref 0.3–0.9)
Total Bilirubin: 0.8 mg/dL (ref 0.3–1.2)
Total Protein: 5.9 g/dL — ABNORMAL LOW (ref 6.5–8.1)

## 2018-05-24 LAB — CREATININE, SERUM
Creatinine, Ser: 1.44 mg/dL — ABNORMAL HIGH (ref 0.44–1.00)
GFR calc Af Amer: 36 mL/min — ABNORMAL LOW (ref >60)
GFR, EST NON AFRICAN AMERICAN: 31 mL/min — AB (ref >60)

## 2018-05-24 LAB — TSH: TSH: 0.171 u[IU]/mL — ABNORMAL LOW (ref 0.350–4.500)

## 2018-05-24 LAB — T4, FREE: Free T4: 1.58 ng/dL (ref 0.82–1.77)

## 2018-05-24 MED ORDER — METOPROLOL TARTRATE 50 MG PO TABS: 50.0000 mg | ORAL_TABLET | Freq: Two times a day (BID) | ORAL | Status: DC

## 2018-05-24 MED ORDER — ONDANSETRON HCL 4 MG/2ML IJ SOLN: 4.0000 mg | Freq: Four times a day (QID) | INTRAMUSCULAR | Status: DC | PRN

## 2018-05-24 MED ORDER — DILTIAZEM HCL ER COATED BEADS 180 MG PO CP24
180.0000 mg | ORAL_CAPSULE | Freq: Every day | ORAL | Status: DC
  Administered 2018-05-24: 180 mg via ORAL
  Filled 2018-05-24: qty 1

## 2018-05-24 MED ORDER — ONDANSETRON HCL 4 MG PO TABS: 4.0000 mg | ORAL_TABLET | Freq: Four times a day (QID) | ORAL | Status: DC | PRN

## 2018-05-24 MED ORDER — LOSARTAN POTASSIUM 50 MG PO TABS
50.0000 mg | ORAL_TABLET | Freq: Every day | ORAL | Status: DC
  Administered 2018-05-24 – 2018-05-25 (×2): 50 mg via ORAL
  Filled 2018-05-24 (×2): qty 1

## 2018-05-24 MED ORDER — ACETAMINOPHEN 650 MG RE SUPP: 650.0000 mg | Freq: Four times a day (QID) | RECTAL | Status: DC | PRN

## 2018-05-24 MED ORDER — METOPROLOL TARTRATE 100 MG PO TABS
100.0000 mg | ORAL_TABLET | Freq: Two times a day (BID) | ORAL | Status: DC
  Administered 2018-05-25: 100 mg via ORAL
  Filled 2018-05-24: qty 1

## 2018-05-24 MED ORDER — LEVOTHYROXINE SODIUM 112 MCG PO TABS
112.0000 ug | ORAL_TABLET | Freq: Every day | ORAL | Status: DC
  Administered 2018-05-24 – 2018-05-25 (×2): 112 ug via ORAL
  Filled 2018-05-24 (×2): qty 1

## 2018-05-24 MED ORDER — PSYLLIUM 95 % PO PACK
1.0000 | PACK | Freq: Every day | ORAL | Status: DC
  Administered 2018-05-24: 1 via ORAL
  Filled 2018-05-24 (×2): qty 1

## 2018-05-24 MED ORDER — PANCRELIPASE (LIP-PROT-AMYL) 12000-38000 UNITS PO CPEP
72000.0000 [IU] | ORAL_CAPSULE | Freq: Three times a day (TID) | ORAL | Status: DC
  Administered 2018-05-24 – 2018-05-25 (×4): 72000 [IU] via ORAL
  Filled 2018-05-24 (×4): qty 6

## 2018-05-24 MED ORDER — FUROSEMIDE 20 MG PO TABS
20.0000 mg | ORAL_TABLET | Freq: Every day | ORAL | Status: DC
  Administered 2018-05-24 – 2018-05-25 (×2): 20 mg via ORAL
  Filled 2018-05-24 (×2): qty 1

## 2018-05-24 MED ORDER — NITROGLYCERIN 0.4 MG SL SUBL: 0.4000 mg | SUBLINGUAL_TABLET | SUBLINGUAL | Status: DC | PRN

## 2018-05-24 MED ORDER — PANTOPRAZOLE SODIUM 40 MG PO TBEC
40.0000 mg | DELAYED_RELEASE_TABLET | Freq: Every day | ORAL | Status: DC
  Administered 2018-05-24 – 2018-05-25 (×3): 40 mg via ORAL
  Filled 2018-05-24 (×4): qty 1

## 2018-05-24 MED ORDER — ACETAMINOPHEN 325 MG PO TABS: 650.0000 mg | ORAL_TABLET | Freq: Four times a day (QID) | ORAL | Status: DC | PRN

## 2018-05-24 MED ORDER — DILTIAZEM HCL 60 MG PO TABS
90.0000 mg | ORAL_TABLET | Freq: Once | ORAL | Status: AC
  Administered 2018-05-24: 90 mg via ORAL
  Filled 2018-05-24: qty 2

## 2018-05-24 MED ORDER — ZOLPIDEM TARTRATE 5 MG PO TABS
5.0000 mg | ORAL_TABLET | Freq: Every evening | ORAL | Status: DC | PRN
  Administered 2018-05-24: 5 mg via ORAL
  Filled 2018-05-24: qty 1

## 2018-05-24 MED ORDER — METOPROLOL TARTRATE 100 MG PO TABS
100.0000 mg | ORAL_TABLET | Freq: Two times a day (BID) | ORAL | Status: DC
  Administered 2018-05-24: 100 mg via ORAL
  Filled 2018-05-24: qty 1

## 2018-05-24 MED ORDER — ENOXAPARIN SODIUM 30 MG/0.3ML ~~LOC~~ SOLN
30.0000 mg | SUBCUTANEOUS | Status: DC
  Administered 2018-05-24 – 2018-05-25 (×2): 30 mg via SUBCUTANEOUS
  Filled 2018-05-24: qty 0.3

## 2018-05-24 MED ORDER — ENOXAPARIN SODIUM 40 MG/0.4ML ~~LOC~~ SOLN
40.0000 mg | SUBCUTANEOUS | Status: DC
  Filled 2018-05-24: qty 0.4

## 2018-05-24 NOTE — Plan of Care (Signed)
  Problem: Education: Goal: Knowledge of General Education information will improve Description Including pain rating scale, medication(s)/side effects and non-pharmacologic comfort measures Outcome: Progressing   Problem: Clinical Measurements: Goal: Ability to maintain clinical measurements within normal limits will improve Outcome: Progressing   Problem: Activity: Goal: Risk for activity intolerance will decrease Outcome: Progressing   

## 2018-05-24 NOTE — Consult Note (Signed)
Cardiology Consultation:   Patient ID: MYKIAH SCHMUCK; [id number]; [birth year]   Admit date: 05/23/2018 Date of Consult: 05/24/2018 Primary Care Provider: Lawerance Cruel, MD Primary Cardiologist: Dr. [name], MD   Patient Profile:   REMELL Ray is a 84 y.o. female with a hx of HTN, hypothyroidism, breast CA, GERD, diastolic dysfunction/probable chronic diastolic CHF, chronic hyponatremia, pancreatic cyst, questionable prior atrial fibrillation who is being seen today for the evaluation of possible atypical flutter versus atrial tachycardia at the request of Dr. Hal Hope.  History of Present Illness:   Cassandra Ray is a 83 year old female with a history stated above who presented to [facility] on 05/23/2018 with complaints of shortness of breath over the last 2 days. She was recently evaluated by her cardiologist Dr. [name] on 05/21/2018 for routine follow up. At that time, she had complaints of persistent fatigue and her metoprolol 100mg  BID was changed to Cardizem CD 180 mg with her first dose being on 05/22/2018. Wednesday morning she reports that she began feeling profoundly fatigued after her dose and she sat in her chair for the remaining of the day and was feeling better. After several hours of sitting, she got up to get ready for a hair appointment and the same symptoms occurred once again. She lives at [place] with her husband. They called the RN to the apartment and her HR was noted to be in the 150's. Therefore, she was transported to the ED for further evaluation.    During the last office visit with Dr. [name] earlier in the week, her EKG showed NSR with first-degree AV block 264ms with a heart rate of 81 bpm and poor R wave progression. BP was stable at 122/62. She denies chest pain, productive cough, nausea, vomiting, palpitations, dizziness or syncope.  In the ED, initial EKG showed atrial tachycardia versus atypical atrial flutter with HR 145. Subsequent EKG with  improved heart rate at 110 showed sinus tachycardia with prolonged PR. She was given a one time dose of diltiazem 90 mg with improvement.  A d-dimer was drawn which was found to be elevated in which a VQ scan was ordered which was negative. Troponin found to be mildly elevated at 0.10, 0.09, 0.08 with flat trend with no complaints of chest pain or anginal symptoms. TSH was within normal limits at 0.171.  CXR with mild bibasilar atelectasis and chronic elevation of right hemidiaphragm.  Of note, in 2014 she underwent a stress test which showed no evidence of ischemia, low risk and normal EF. Echocardiogram showed EF of 55-60% with mild LVH, mild pulmonary hypertension, trace TR during a hospitalization in [place]. At that time she was diagnosed with atrial fibrillation with RVR and treated with IV Lopressor. Dr. [name]' review of the strips, he could not clearly state that she had AF as there did appear to be P waves present. Subsequently, she wore an event monitor which showed no evidence of recurrent A. fib.  She was admitted once again in 2015 with shortness of breath and hypertensive urgency with pulmonary edema.  She was treated with Lasix.  Her sodium at that time was 123.  Past Medical History:  Diagnosis Date  . Actinic keratoses   . Arthritis    back   . Atrial fibrillation ([place])    a. questionable episode in 2015 in [facility]. b. event monitor through December/January 2014/2015 which showed no evidence of atrial fibrillation.  . Breast cancer ([place])    Breast cancer (right)  .  Chronic diastolic CHF (congestive heart failure) ([place])   . Compression fracture of L3 lumbar vertebra   . Dysphagia   . GERD (gastroesophageal reflux disease)    takes otc Pepcid as needed  . Hiatal hernia 11/27/2014  . HTN (hypertension)   . Hypothyroidism   . Insomnia   . Osteopenia   . Pleural effusion     Past Surgical History:  Procedure Laterality Date  . BREAST LUMPECTOMY Right 1995   lumpectomy  .  COLONOSCOPY    . EYE SURGERY Bilateral    cataract w/ lens implant  . KYPHOPLASTY N/A 09/18/2013   Procedure: KYPHOPLASTY;  Surgeon: Sinclair Ship, MD;  Location: [place];  Service: Orthopedics;  Laterality: N/A;  Lumbar 3 kyphoplasty  . TONSILLECTOMY       Prior to Admission medications   Medication Sig Start Date End Date Taking? Authorizing Provider  acetaminophen (TYLENOL) 500 MG tablet Take 500-1,000 mg by mouth every 8 (eight) hours as needed for mild pain or headache.   Yes [provider]  B Complex-C (B-COMPLEX WITH VITAMIN C) tablet Take 1 tablet by mouth daily.   Yes [provider]  Calcium-Phosphorus-Vitamin D (CITRACAL +D3 PO) Take 1 tablet by mouth daily.   Yes [provider]  Cholecalciferol (VITAMIN D3) 2000 units TABS Take 2,000 Units by mouth daily.    Yes [provider]  CREON 36000 units CPEP capsule TAKE 2 CAPSULES WITH MEALS AND 1 CAPSULE WITH SNACKS. Patient taking differently: Take 72,000 Units by mouth 3 (three) times daily with meals.  03/19/18  Yes Danis, Kirke Corin, MD  diltiazem (CARDIZEM CD) 180 MG 24 hr capsule Take 1 capsule (180 mg total) by mouth daily. 05/21/18 05/16/19 Yes Jerline Pain, MD  furosemide (LASIX) 20 MG tablet Take 1 tablet (20 mg total) by mouth daily. 11/28/17 05/23/18 Yes Dunn, Dayna N, PA-C  Glucosamine HCl 1500 MG TABS Take 1,500 mg by mouth 2 (two) times daily.    Yes [provider]  levothyroxine (SYNTHROID, LEVOTHROID) 112 MCG tablet Take 112 mcg by mouth daily before breakfast.   Yes [provider]  loperamide (IMODIUM A-D) 2 MG tablet Take 2 mg by mouth as needed for diarrhea or loose stools.   Yes [provider]  losartan (COZAAR) 50 MG tablet Take 50 mg by mouth daily.   Yes [provider]  Melatonin 5 MG TABS Take 5 mg by mouth at bedtime as needed (for sleep).    Yes [provider]  nitroGLYCERIN (NITROSTAT) 0.4 MG SL tablet Place 0.4 mg  under the tongue every 5 (five) minutes as needed for chest pain.   Yes [provider]  pantoprazole (PROTONIX) 40 MG tablet Take 40 mg by mouth daily.   Yes [provider]  Probiotic Product (ALIGN) 4 MG CAPS Take 4 mg by mouth daily.   Yes [provider]  SYMBICORT 80-4.5 MCG/ACT inhaler Take 2 puffs by mouth every morning.  12/09/14  Yes [provider]    Inpatient Medications: Scheduled Meds: . diltiazem  180 mg Oral Daily  . enoxaparin (LOVENOX) injection  30 mg Subcutaneous Q24H  . furosemide  20 mg Oral Daily  . levothyroxine  112 mcg Oral QAC breakfast  . lipase/protease/amylase  72,000 Units Oral TID WC  . losartan  50 mg Oral Daily  . pantoprazole  40 mg Oral Daily   Continuous Infusions:  PRN Meds: acetaminophen **OR** acetaminophen, nitroGLYCERIN, ondansetron **OR** ondansetron (ZOFRAN) IV  Allergies:    Allergies  Allergen Reactions  . Tape Other (See Comments)    Patient prefers easy-release tape    Social History:   Social History   Socioeconomic History  . Marital status: Married    Spouse name: Not on file  . Number of children: Not on file  . Years of education: Not on file  . Highest education level: Not on file  Occupational History  . Not on file  Social Needs  . Financial resource strain: Not on file  . Food insecurity:    Worry: Not on file    Inability: Not on file  . Transportation needs:    Medical: Not on file    Non-medical: Not on file  Tobacco Use  . Smoking status: Former Research scientist (life sciences)  . Smokeless tobacco: Never Used  . Tobacco comment: only in college  Substance and Sexual Activity  . Alcohol use: Yes    Alcohol/week: 7.0 standard drinks    Types: 7 Glasses of wine per week    Comment: 1 glass of wine a day  . Drug use: No  . Sexual activity: Not on file  Lifestyle  . Physical activity:    Days per week: Not on file    Minutes per session: Not on file  . Stress: Not on file  Relationships    . Social connections:    Talks on phone: Not on file    Gets together: Not on file    Attends religious service: Not on file    Active member of club or organization: Not on file    Attends meetings of clubs or organizations: Not on file    Relationship status: Not on file  . Intimate partner violence:    Fear of current or ex partner: Not on file    Emotionally abused: Not on file    Physically abused: Not on file    Forced sexual activity: Not on file  Other Topics Concern  . Not on file  Social History Narrative  . Not on file    Family History:   Family History  Problem Relation Age of Onset  . CAD Mother   . Diabetes Mellitus II Mother   . Heart failure Mother   . Heart failure Father   . Prostate cancer Brother   . Kidney cancer Brother   . Heart disease Sister   . Esophageal cancer Neg Hx   . Colon cancer Neg Hx    Family Status:  Family Status  Relation Name Status  . Mother  Deceased  . Father  Deceased  . Brother  Deceased  . Brother  Deceased  . MGM  Deceased  . MGF  Deceased  . PGM  Deceased  . PGF  Deceased  . Sister  (Not Specified)  . Neg Hx  (Not Specified)   ROS:  Please see the history of present illness.  All other ROS reviewed and negative.     Physical Exam/Data:   Vitals:   05/24/18 0439 05/24/18 0442 05/24/18 0740 05/24/18 1219  BP:  (!) 119/55 (!) 113/57 (!) 110/55  Pulse:  (!) 111 (!) 108 (!) 101  Resp:  16 20 (!) 25  Temp:  98.6 F (37 C)  97.6 F (36.4 C)  TempSrc:    Oral  SpO2:  95% 97% 97%  Weight: 79.9 kg     Height:        Intake/Output Summary (Last 24 hours) at 05/24/2018 1347 Last data  filed at 05/24/2018 1221 Gross per 24 hour  Intake 1000 ml  Output 725 ml  Net 275 ml   Filed Weights   05/23/18 1604 05/23/18 2356 05/24/18 0439  Weight: 79.2 kg 79.9 kg 79.9 kg   Body mass index is 30.24 kg/m.   General: [place], NAD Skin: Warm, dry, intact  Head: Normocephalic, atraumatic, clear, moist mucus  membranes. Neck: Negative for carotid bruits. No JVD Lungs:Clear to ausculation bilaterally. No wheezes, rales, or rhonchi. Breathing is unlabored. Cardiovascular: RRR with S1 S2. No murmurs, rubs, gallops, or LV heave appreciated. Abdomen: Soft, non-tender, non-distended with normoactive bowel sounds. No obvious abdominal masses. MSK: Strength and tone appear normal for age. 5/5 in all extremities Extremities: No edema. No clubbing or cyanosis. DP/PT pulses 2+ bilaterally Neuro: Alert and oriented. No focal deficits. No facial asymmetry. MAE spontaneously. Psych: Responds to questions appropriately with normal affect.    EKG:  The EKG was personally reviewed and demonstrates:  Telemetry:  Telemetry was personally reviewed and demonstrates: 05/24/18 ST with HR low 100's   Relevant CV Studies:  ECHO: 02/02/2016: Study Conclusions  - Left ventricle: The cavity size was normal. Wall thickness was   increased in a pattern of moderate LVH. There was focal basal   hypertrophy. Systolic function was normal. The estimated ejection   fraction was in the range of 60% to 65%. Wall motion was normal;   there were no regional wall motion abnormalities. Features are   consistent with a pseudonormal left ventricular filling pattern,   with concomitant abnormal relaxation and increased filling   pressure (grade 2 diastolic dysfunction).  CATH: None  Laboratory Data:  Chemistry Recent Labs  Lab 05/23/18 1608 05/24/18 0220 05/24/18 0618  NA 138  --  140  K 3.1*  --  3.8  CL 103  --  108  CO2 19*  --  21*  GLUCOSE 127*  --  110*  BUN 37*  --  27*  CREATININE 1.53* 1.44* 1.37*  CALCIUM 9.4  --  9.0  GFRNONAA 29* 31* 33*  GFRAA 34* 36* 38*  ANIONGAP 16*  --  11    Total Protein  Date Value Ref Range Status  05/24/2018 5.9 (L) 6.5 - 8.1 g/dL Final   Albumin  Date Value Ref Range Status  05/24/2018 2.9 (L) 3.5 - 5.0 g/dL Final   AST  Date Value Ref Range Status  05/24/2018 26 15  - 41 U/L Final   ALT  Date Value Ref Range Status  05/24/2018 17 0 - 44 U/L Final   Alkaline Phosphatase  Date Value Ref Range Status  05/24/2018 59 38 - 126 U/L Final   Total Bilirubin  Date Value Ref Range Status  05/24/2018 0.8 0.3 - 1.2 mg/dL Final   Hematology Recent Labs  Lab 05/23/18 1608 05/24/18 0220 05/24/18 0618  WBC 11.4* 10.7* 8.5  RBC 4.17 3.63* 3.52*  HGB 12.8 11.7* 11.2*  HCT 40.1 33.9* 33.1*  MCV 96.2 93.4 94.0  MCH 30.7 32.2 31.8  MCHC 31.9 34.5 33.8  RDW 12.9 12.9 13.2  PLT 192 188 182   Cardiac Enzymes Recent Labs  Lab 05/24/18 0220 05/24/18 0618 05/24/18 1149  TROPONINI 0.10* 0.09* 0.08*    Recent Labs  Lab 05/23/18 1610  TROPIPOC 0.01    BNP Recent Labs  Lab 05/23/18 1608  BNP 146.6*    DDimer  Recent Labs  Lab 05/23/18 1646  DDIMER 2.62*   TSH:  Lab Results  Component Value  Date   TSH 0.171 (L) 05/24/2018   Lipids:No results found for: CHOL, HDL, LDLCALC, LDLDIRECT, TRIG, CHOLHDL HgbA1c:No results found for: HGBA1C  Radiology/Studies:  Nm Pulmonary Vent And Perf (v/q Scan)  Result Date: 05/23/2018 CLINICAL DATA:  Shortness of breath and elevated D-dimer. EXAM: NUCLEAR MEDICINE VENTILATION - PERFUSION LUNG SCAN TECHNIQUE: Ventilation images were obtained in multiple projections using inhaled aerosol Tc-59m DTPA. Perfusion images were obtained in multiple projections after intravenous injection of Tc-35m MAA. RADIOPHARMACEUTICALS:  30 mCi of Tc-35m DTPA aerosol inhalation and 4 mCi Tc99m MAA IV COMPARISON:  Chest radiograph 05/23/2018 FINDINGS: Ventilation: Mostly homogeneous distribution tracer activity throughout the lungs with residual tracer pooling in the central bronchi. Perfusion: Homogeneous distribution of tracer activity throughout the lungs without focal defect. IMPRESSION: Very low probability of pulmonary embolus. Electronically Signed   By: Lucienne Capers M.D.   On: 05/23/2018 23:06   Dg Chest Portable 1  View  Result Date: 05/23/2018 CLINICAL DATA:  Short of breath.  History of breast cancer EXAM: PORTABLE CHEST 1 VIEW COMPARISON:  02/01/2016 FINDINGS: Chronic elevation right hemidiaphragm. Chronic scarring or atelectasis in the right lung base Negative for heart failure or pneumonia. Mild left lower lobe atelectasis Surgical clips in the right breast and right axilla IMPRESSION: Mild bibasilar atelectasis.  Chronic elevation right hemidiaphragm. Electronically Signed   By: Franchot Gallo M.D.   On: 05/23/2018 16:29   Assessment and Plan:   1.  Atrial tachycardia versus atypical atrial flutter: -Presenting EKG with questionable atrial tachycardia versus atypical tachycardia with follow up EKG showing sinus tachycardia with HR 110 and prolonged PR  -Recently changed from metoprolol to diltiazem 180 mg CD daily secondary to feelings of chronic fatigue thought to be secondary to BB.  Her first dose of diltiazem was 05/22/2018.  -She presented to [facility] on 05/24/2018 with persistent shortness of breath and found to have a heart rate in the 140s with questionable EKG with atrial tachycardia versus atypical atrial flutter.  -She has a remote history of questionable atrial fibrillation dating back to 2015, dx during a hospitalization in [place]. She was treated with IV Lopressor. Per chart review, her primary cardiologist reviewed the strips and felt as if he could not clearly state that she had AF as there did appear to be P waves present>> therefore no anticoagulation -Trop mildly elevated but flat at 0.10, 0.09, 0.08 with no anginal symptoms and no prior history of CAD -Telemetry with ST with HR low 100's -Will change back to metoprolol 100mg  BID and monitor BP and HR closely   2. Chronic diastolic CHF: -Per echocardiogram 02/02/2016 with LVEF of 60 to 65% with no wall motion abnormality and G2 DD with normal valves noted -Overall appears euvolemic with mild ankle edema however this fluctuate and  improves with elevation -Continue Lasix 20 mg -Patient appears to be euvolemic on exam with no overt s/s of fluid volume overload -CXR with mild bibasilar atelectasis and chronic elevation of right hemidiaphragm -VQ scan performed secondary to elevated d-dimer on presentation which was negative for PE -Continue strict I&O and daily weights -Weight, 176lb>> was 174lb on 05/21/2017 during office visit -I&O, net positive 275 mL  2.  Hypertension: -Stable, 110/55, 113/57, 119/55 -Will transition back to metoprolol 100mg  BID for rate and BP control   3.  History of pleural effusion: -Stable with no acute changes per imaging  -Given her age and asymptomatic nature we will continue to monitor clinically  4.  Acute on chronic  CKD stage II: -Creatinine, 1.53 on presentation and downtrending>>1.44>1.37 -Avoid nephrotoxic medications   For questions or updates, please contact [place] Please consult [email or web address] for contact info under Cardiology/STEMI.   SignedKathyrn Drown NP-C [facility] Pager: [phone] 05/24/2018 1:47 PM

## 2018-05-24 NOTE — Telephone Encounter (Signed)
Pt currently admitted.

## 2018-05-24 NOTE — H&P (Signed)
History and Physical    Cassandra Ray:[id number] DOB: 09-09-1922 DOA: 05/23/2018  PCP: Lawerance Cruel, MD  Patient coming from: Home.  Chief Complaint: Shortness of breath.  HPI: Cassandra Ray is a 84 y.o. female with history of diastolic dysfunction presents to the ER with complaint of increasing shortness of breath over the last 2 days.  Patient had recently followed up with her cardiologist and at that time had complained of fatigue and patient's metoprolol was changed to Cardizem CD 180 mg first dose was taken 2 days ago on May 22, 2018.  Second dose was taken yesterday morning.  Patient has been gradually getting short of breath on exertion last 2 days.  Denies any chest pain productive cough fever chills nausea vomiting diarrhea.  ED Course: In the ER patient was found to be tachycardic with heart rate in the 140s.  EKG was showing as confirmed with cardiologist Dr. Loralie Champagne later it was possible atypical flutter versus atrial tachycardia.  Since patient was persistently tachycardic I have ordered 1 more dose of Cardizem 90 mg p.o.  D-dimer was elevated but VQ scan was negative.  TSH is pending.  We will admit for acute respiratory failure with tachycardia.  Review of Systems: As per HPI, rest all negative.   Past Medical History:  Diagnosis Date  . Actinic keratoses   . Arthritis    back   . Atrial fibrillation ([place])    a. questionable episode in 2015 in [facility]. b. event monitor through December/January 2014/2015 which showed no evidence of atrial fibrillation.  . Breast cancer ([place])    Breast cancer (right)  . Chronic diastolic CHF (congestive heart failure) ([place])   . Compression fracture of L3 lumbar vertebra   . Dysphagia   . GERD (gastroesophageal reflux disease)    takes otc Pepcid as needed  . Hiatal hernia 11/27/2014  . HTN (hypertension)   . Hypothyroidism   . Insomnia   . Osteopenia   . Pleural effusion     Past Surgical History:    Procedure Laterality Date  . BREAST LUMPECTOMY Right 1995   lumpectomy  . COLONOSCOPY    . EYE SURGERY Bilateral    cataract w/ lens implant  . KYPHOPLASTY N/A 09/18/2013   Procedure: KYPHOPLASTY;  Surgeon: Sinclair Ship, MD;  Location: [facility];  Service: Orthopedics;  Laterality: N/A;  Lumbar 3 kyphoplasty  . TONSILLECTOMY       reports that she has quit smoking. She has never used smokeless tobacco. She reports current alcohol use of about 7.0 standard drinks of alcohol per week. She reports that she does not use drugs.  Allergies  Allergen Reactions  . Tape Other (See Comments)    Patient prefers easy-release tape    Family History  Problem Relation Age of Onset  . CAD Mother   . Diabetes Mellitus II Mother   . Heart failure Mother   . Heart failure Father   . Prostate cancer Brother   . Kidney cancer Brother   . Heart disease Sister   . Esophageal cancer Neg Hx   . Colon cancer Neg Hx     Prior to Admission medications   Medication Sig Start Date End Date Taking? Authorizing Provider  acetaminophen (TYLENOL) 500 MG tablet Take 500-1,000 mg by mouth every 8 (eight) hours as needed for mild pain or headache.   Yes [provider]  B Complex-C (B-COMPLEX WITH VITAMIN C) tablet Take 1 tablet by mouth  daily.   Yes [provider]  Calcium-Phosphorus-Vitamin D (CITRACAL +D3 PO) Take 1 tablet by mouth daily.   Yes [provider]  Cholecalciferol (VITAMIN D3) 2000 units TABS Take 2,000 Units by mouth daily.    Yes [provider]  CREON 36000 units CPEP capsule TAKE 2 CAPSULES WITH MEALS AND 1 CAPSULE WITH SNACKS. Patient taking differently: Take 72,000 Units by mouth 3 (three) times daily with meals.  03/19/18  Yes Danis, Kirke Corin, MD  diltiazem (CARDIZEM CD) 180 MG 24 hr capsule Take 1 capsule (180 mg total) by mouth daily. 05/21/18 05/16/19 Yes Jerline Pain, MD  furosemide (LASIX) 20 MG tablet Take 1 tablet (20 mg total) by  mouth daily. 11/28/17 05/23/18 Yes Dunn, Dayna N, PA-C  Glucosamine HCl 1500 MG TABS Take 1,500 mg by mouth 2 (two) times daily.    Yes [provider]  levothyroxine (SYNTHROID, LEVOTHROID) 112 MCG tablet Take 112 mcg by mouth daily before breakfast.   Yes [provider]  loperamide (IMODIUM A-D) 2 MG tablet Take 2 mg by mouth as needed for diarrhea or loose stools.   Yes [provider]  losartan (COZAAR) 50 MG tablet Take 50 mg by mouth daily.   Yes [provider]  Melatonin 5 MG TABS Take 5 mg by mouth at bedtime as needed (for sleep).    Yes [provider]  nitroGLYCERIN (NITROSTAT) 0.4 MG SL tablet Place 0.4 mg under the tongue every 5 (five) minutes as needed for chest pain.   Yes [provider]  pantoprazole (PROTONIX) 40 MG tablet Take 40 mg by mouth daily.   Yes [provider]  Probiotic Product (ALIGN) 4 MG CAPS Take 4 mg by mouth daily.   Yes [provider]  SYMBICORT 80-4.5 MCG/ACT inhaler Take 2 puffs by mouth every morning.  12/09/14  Yes [provider]    Physical Exam: Vitals:   05/23/18 2230 05/23/18 2300 05/23/18 2342 05/23/18 2356  BP: (!) 143/71 (!) 152/80 (!) 151/76   Pulse: (!) 117 (!) 130 (!) 132   Resp:   (!) 23   Temp:   97.6 F (36.4 C)   TempSrc:   Oral   SpO2: 97% 100% 100%   Weight:    79.9 kg  Height:    5\' 4"  (1.626 m)      Constitutional: Moderately built and nourished. Vitals:   05/23/18 2230 05/23/18 2300 05/23/18 2342 05/23/18 2356  BP: (!) 143/71 (!) 152/80 (!) 151/76   Pulse: (!) 117 (!) 130 (!) 132   Resp:   (!) 23   Temp:   97.6 F (36.4 C)   TempSrc:   Oral   SpO2: 97% 100% 100%   Weight:    79.9 kg  Height:    5\' 4"  (1.626 m)   Eyes: Anicteric no pallor. ENMT: No discharge from the ears eyes nose or mouth. Neck: No mass felt.  No neck rigidity no JVD appreciated. Respiratory: No rhonchi or crepitations. Cardiovascular: S1-S2 heard. Abdomen: Soft  nontender bowel sounds present. Musculoskeletal: No edema. Skin: No rash. Neurologic: Alert awake oriented to time place and person.  Moves all extremities. Psychiatric: Appears normal.   Labs on Admission: I have personally reviewed following labs and imaging studies  CBC: Recent Labs  Lab 05/23/18 1608  WBC 11.4*  NEUTROABS 5.4  HGB 12.8  HCT 40.1  MCV 96.2  PLT 509   Basic Metabolic Panel: Recent Labs  Lab 05/23/18  1608  NA 138  K 3.1*  CL 103  CO2 19*  GLUCOSE 127*  BUN 37*  CREATININE 1.53*  CALCIUM 9.4   GFR: Estimated Creatinine Clearance: 23.5 mL/min (A) (by C-G formula based on SCr of 1.53 mg/dL (H)). Liver Function Tests: No results for input(s): AST, ALT, ALKPHOS, BILITOT, PROT, ALBUMIN in the last 168 hours. No results for input(s): LIPASE, AMYLASE in the last 168 hours. No results for input(s): AMMONIA in the last 168 hours. Coagulation Profile: No results for input(s): INR, PROTIME in the last 168 hours. Cardiac Enzymes: No results for input(s): CKTOTAL, CKMB, CKMBINDEX, TROPONINI in the last 168 hours. BNP (last 3 results) No results for input(s): PROBNP in the last 8760 hours. HbA1C: No results for input(s): HGBA1C in the last 72 hours. CBG: No results for input(s): GLUCAP in the last 168 hours. Lipid Profile: No results for input(s): CHOL, HDL, LDLCALC, TRIG, CHOLHDL, LDLDIRECT in the last 72 hours. Thyroid Function Tests: No results for input(s): TSH, T4TOTAL, FREET4, T3FREE, THYROIDAB in the last 72 hours. Anemia Panel: No results for input(s): VITAMINB12, FOLATE, FERRITIN, TIBC, IRON, RETICCTPCT in the last 72 hours. Urine analysis:    Component Value Date/Time   COLORURINE YELLOW 04/24/2014 2257   APPEARANCEUR CLEAR 04/24/2014 2257   LABSPEC 1.009 04/24/2014 2257   PHURINE 7.5 04/24/2014 2257   GLUCOSEU NEGATIVE 04/24/2014 2257   HGBUR NEGATIVE 04/24/2014 2257   BILIRUBINUR NEGATIVE 04/24/2014 2257   KETONESUR NEGATIVE 04/24/2014  2257   PROTEINUR NEGATIVE 04/24/2014 2257   UROBILINOGEN 0.2 04/24/2014 2257   NITRITE NEGATIVE 04/24/2014 2257   LEUKOCYTESUR NEGATIVE 04/24/2014 2257   Sepsis Labs: @LABRCNTIP (procalcitonin:4,lacticidven:4) )No results found for this or any previous visit (from the past 240 hour(s)).   Radiological Exams on Admission: Nm Pulmonary Vent And Perf (v/q Scan)  Result Date: 05/23/2018 CLINICAL DATA:  Shortness of breath and elevated D-dimer. EXAM: NUCLEAR MEDICINE VENTILATION - PERFUSION LUNG SCAN TECHNIQUE: Ventilation images were obtained in multiple projections using inhaled aerosol Tc-37m DTPA. Perfusion images were obtained in multiple projections after intravenous injection of Tc-46m MAA. RADIOPHARMACEUTICALS:  30 mCi of Tc-39m DTPA aerosol inhalation and 4 mCi Tc11m MAA IV COMPARISON:  Chest radiograph 05/23/2018 FINDINGS: Ventilation: Mostly homogeneous distribution tracer activity throughout the lungs with residual tracer pooling in the central bronchi. Perfusion: Homogeneous distribution of tracer activity throughout the lungs without focal defect. IMPRESSION: Very low probability of pulmonary embolus. Electronically Signed   By: Lucienne Capers M.D.   On: 05/23/2018 23:06   Dg Chest Portable 1 View  Result Date: 05/23/2018 CLINICAL DATA:  Short of breath.  History of breast cancer EXAM: PORTABLE CHEST 1 VIEW COMPARISON:  02/01/2016 FINDINGS: Chronic elevation right hemidiaphragm. Chronic scarring or atelectasis in the right lung base Negative for heart failure or pneumonia. Mild left lower lobe atelectasis Surgical clips in the right breast and right axilla IMPRESSION: Mild bibasilar atelectasis.  Chronic elevation right hemidiaphragm. Electronically Signed   By: Franchot Gallo M.D.   On: 05/23/2018 16:29    EKG: Independently reviewed.  Tachycardic initial EKG was showing heart rate of 140 bpm.  Discussed with Dr. Aundra Dubin or felt this may be atypical flutter versus atrial  tachycardia.  Assessment/Plan Principal Problem:   Acute respiratory failure with hypoxia (HCC) Active Problems:   Hypothyroidism   Chronic diastolic heart failure (HCC)   Tachycardia    1. Acute respiratory failure with hypoxia with tachycardia discussed EKG with Dr. Aundra Dubin who at this time feels patient EKG may  be showing atypical atrial flutter versus atrial tachycardia.  I have ordered an additional dose of 90 mg p.o. Cardizem.  Will get cardiology consult formally.  Check TSH cardiac markers continue to monitor in telemetry. 2. Hypertension on Cardizem.  Closely monitor blood pressure trends. 3. Hypothyroidism on Synthroid check TSH. 4. Chronic diastolic CHF chest x-ray unremarkable.  Continue Lasix.  Closely follow intake output metabolic panel last 2D echo done in August 2017 was showing EF of 60 to 55% with grade 2 diastolic dysfunction.   DVT prophylaxis: Lovenox. Code Status: Full code.  This is a change from previous and confirmed with patient. Family Communication: Patient's husband and daughter. Disposition Plan: Home. Consults called: Cardiology. Admission status: Observation.   Rise Patience MD [facility] Pager [phone].  If 7PM-7AM, please contact night-coverage [email or web address] Password [facility]  05/24/2018, 12:53 AM

## 2018-05-24 NOTE — Plan of Care (Signed)

## 2018-05-24 NOTE — Progress Notes (Signed)
Cassandra Ray is a 84 y.o. female with history of diastolic dysfunction presents to the ER with complaint of increasing shortness of breath over the last 2 days.   D-dimer was elevated but VQ scan was negative.  TSH is suppressed.  We will admit for acute respiratory failure with tachycardia.   Plan: Cardiology consult  Thyroid panel.    Hosie Poisson, MD [phone]

## 2018-05-25 ENCOUNTER — Observation Stay (HOSPITAL_BASED_OUTPATIENT_CLINIC_OR_DEPARTMENT_OTHER): Payer: Medicare Other

## 2018-05-25 DIAGNOSIS — I471 Supraventricular tachycardia: Secondary | ICD-10-CM | POA: Diagnosis not present

## 2018-05-25 DIAGNOSIS — I361 Nonrheumatic tricuspid (valve) insufficiency: Secondary | ICD-10-CM | POA: Diagnosis not present

## 2018-05-25 DIAGNOSIS — I5033 Acute on chronic diastolic (congestive) heart failure: Secondary | ICD-10-CM | POA: Diagnosis not present

## 2018-05-25 DIAGNOSIS — I5032 Chronic diastolic (congestive) heart failure: Secondary | ICD-10-CM | POA: Diagnosis not present

## 2018-05-25 DIAGNOSIS — E038 Other specified hypothyroidism: Secondary | ICD-10-CM | POA: Diagnosis not present

## 2018-05-25 DIAGNOSIS — J9601 Acute respiratory failure with hypoxia: Secondary | ICD-10-CM | POA: Diagnosis not present

## 2018-05-25 LAB — BASIC METABOLIC PANEL
ANION GAP: 10 (ref 5–15)
BUN: 23 mg/dL (ref 8–23)
CO2: 24 mmol/L (ref 22–32)
Calcium: 9.1 mg/dL (ref 8.9–10.3)
Chloride: 107 mmol/L (ref 98–111)
Creatinine, Ser: 1.49 mg/dL — ABNORMAL HIGH (ref 0.44–1.00)
GFR calc Af Amer: 35 mL/min — ABNORMAL LOW (ref >60)
GFR calc non Af Amer: 30 mL/min — ABNORMAL LOW (ref >60)
Glucose, Bld: 103 mg/dL — ABNORMAL HIGH (ref 70–99)
POTASSIUM: 4 mmol/L (ref 3.5–5.1)
Sodium: 141 mmol/L (ref 135–145)

## 2018-05-25 LAB — CBC
HCT: 37.3 % (ref 36.0–46.0)
HEMOGLOBIN: 11.8 g/dL — AB (ref 12.0–15.0)
MCH: 30.3 pg (ref 26.0–34.0)
MCHC: 31.6 g/dL (ref 30.0–36.0)
MCV: 95.9 fL (ref 80.0–100.0)
Platelets: 180 10*3/uL (ref 150–400)
RBC: 3.89 MIL/uL (ref 3.87–5.11)
RDW: 13.2 % (ref 11.5–15.5)
WBC: 6.9 10*3/uL (ref 4.0–10.5)
nRBC: 0 % (ref 0.0–0.2)

## 2018-05-25 LAB — T3, FREE: T3, Free: 2.3 pg/mL (ref 2.0–4.4)

## 2018-05-25 LAB — ECHOCARDIOGRAM COMPLETE
Height: 64 in
Weight: 2800.72 oz

## 2018-05-25 MED ORDER — METOPROLOL TARTRATE 50 MG PO TABS
50.0000 mg | ORAL_TABLET | Freq: Two times a day (BID) | ORAL | Status: DC
  Administered 2018-05-25: 50 mg via ORAL

## 2018-05-25 MED ORDER — FUROSEMIDE 20 MG PO TABS: 20.0000 mg | ORAL_TABLET | ORAL | Status: DC

## 2018-05-25 MED ORDER — FUROSEMIDE 40 MG PO TABS: 40.0000 mg | ORAL_TABLET | Freq: Every day | ORAL | 0 refills | Status: DC

## 2018-05-25 MED ORDER — FUROSEMIDE 40 MG PO TABS: 40.0000 mg | ORAL_TABLET | Freq: Every day | ORAL | Status: DC

## 2018-05-25 MED ORDER — METOPROLOL TARTRATE 50 MG PO TABS: 50.0000 mg | ORAL_TABLET | Freq: Two times a day (BID) | ORAL | 0 refills | Status: DC

## 2018-05-25 NOTE — Progress Notes (Signed)
  Echocardiogram 2D Echocardiogram has been performed. Patient refused Definity use.  Cassandra Ray 05/25/2018, 10:41 AM

## 2018-05-25 NOTE — Progress Notes (Signed)
Faxed PT order to [facility], PT provider at [facility] as requested by patient and spouse. Per patient and spouse they decline [facility] RN as they state that they can refer to the nurse on site at [facility]. No other CM needs identified.

## 2018-05-25 NOTE — Progress Notes (Signed)
Patient having audible wheezing on expiration. Patient denies SOB. Patient states that she has an inhaler and the wheezing usually goes away. Patient is not in any acute distress. Oxygen sats 97%. Will continue to monitor

## 2018-05-25 NOTE — Progress Notes (Signed)
Patient refused morning labs.

## 2018-05-25 NOTE — Evaluation (Signed)
Physical Therapy Evaluation Patient Details Name: Cassandra Ray MRN: [id number] DOB: [birth year] Today's Date: 05/25/2018   History of Present Illness  Cassandra Ray is a 84 y.o. female with a hx of HTN, hypothyroidism, breast CA, GERD, diastolic dysfunction/probable chronic diastolic CHF, chronic hyponatremia, pancreatic cyst, questionable prior atrial fibrillation who is being seen today for the evaluation of possible atypical flutter versus atrial tachycardia   Clinical Impression  Pt did walking with PT today 120 feet.  Pt educated on safety with RW.  Pt and husband educated on monitoring HR and what to look for.  Pt educated on getting onto walking program.  Pt educated on use of inspirometer.  Pt ready to go home - will benefit from HHPT to continue with education - helping pt increase her activity level safely.    Follow Up Recommendations Home health PT    Equipment Recommendations  None recommended by PT    Recommendations for Other Services       Precautions / Restrictions Precautions Precautions: None Restrictions Weight Bearing Restrictions: No      Mobility  Bed Mobility Overal bed mobility: Independent                Transfers Overall transfer level: Needs assistance Equipment used: Rolling walker (2 wheeled) Transfers: Sit to/from Stand Sit to Stand: Supervision            Ambulation/Gait Ambulation/Gait assistance: Min guard Gait Distance (Feet): 120 Feet Assistive device: Rolling walker (2 wheeled) Gait Pattern/deviations: Shuffle;Trunk flexed     General Gait Details: pt said she walks better with shoes (versus hospital socks).  pt has genu valgus (right greater than left).  pt uses walker all the time when up - educated pt on safety with backing up to surface - keeping Rw with her and backing all the way up.  pt wanting to start sitting 2 feet before bed surface.  pt demonstrated understanding  Stairs            Wheelchair  Mobility    Modified Rankin (Stroke Patients Only)       Balance Overall balance assessment: No apparent balance deficits (not formally assessed)                                           Pertinent Vitals/Pain Pain Assessment: No/denies pain    Home Living Family/patient expects to be discharged to:: Private residence Living Arrangements: Spouse/significant other Available Help at Discharge: Family Type of Home: Independent living facility Home Access: [place]: One level Home Equipment: Environmental consultant - 2 wheels;Walker - 4 wheels;Grab bars - tub/shower;Grab bars - toilet Additional Comments: pt uses rollator in facility and RW in community.  pt was getting SOB with her walking prior to illness.  Pt still gets down in bathtub for bathing.      Prior Function           Comments: Pt denies recent falls.  she has supportive husband     Hand Dominance        Extremity/Trunk Assessment        Lower Extremity Assessment Lower Extremity Assessment: Generalized weakness(pt has significant genu valgus both knees - left more than right. she says she walks better in her shoes versus her hospital socks)    Cervical / Trunk Assessment Cervical / Trunk Assessment: Normal  Communication  Communication: No difficulties  Cognition Arousal/Alertness: Awake/alert Behavior During Therapy: WFL for tasks assessed/performed Overall Cognitive Status: Within Functional Limits for tasks assessed                                        General Comments General comments (skin integrity, edema, etc.): pt uses RW all the time when standing - educated ehr on safety with this.    Exercises     Assessment/Plan    PT Assessment All further PT needs can be met in the next venue of care  PT Problem List Decreased strength;Decreased mobility;Decreased activity tolerance;Decreased balance;Cardiopulmonary status limiting activity       PT  Treatment Interventions      PT Goals (Current goals can be found in the Care Plan section)  Acute Rehab PT Goals Patient Stated Goal: to get home today and do well PT Goal Formulation: With patient/family Time For Goal Achievement: 05/25/18 Potential to Achieve Goals: Good    Frequency     Barriers to discharge        Co-evaluation               AM-PAC PT "6 Clicks" Mobility  Outcome Measure Help needed turning from your back to your side while in a flat bed without using bedrails?: A Little Help needed moving from lying on your back to sitting on the side of a flat bed without using bedrails?: A Little Help needed moving to and from a bed to a chair (including a wheelchair)?: A Little Help needed standing up from a chair using your arms (e.g., wheelchair or bedside chair)?: A Little Help needed to walk in hospital room?: A Little Help needed climbing 3-5 steps with a railing? : Total 6 Click Score: 16    End of Session   Activity Tolerance: Patient tolerated treatment well Patient left: in bed;with call bell/phone within reach;with family/visitor present Nurse Communication: Mobility status PT Visit Diagnosis: Difficulty in walking, not elsewhere classified (R26.2)    Time: 1535-1610 PT Time Calculation (min) (ACUTE ONLY): 35 min   Charges:   PT Evaluation $PT Eval Low Complexity: 1 Low PT Treatments $Gait Training: 8-22 mins        05/25/2018   Rande Lawman, PT   Loyal Buba 05/25/2018, 4:35 PM

## 2018-05-25 NOTE — Procedures (Signed)
Echo attempted. Patient stated she was not awake and requested that I come back later. Will attempt later as time allows.

## 2018-05-25 NOTE — Progress Notes (Signed)
DAILY PROGRESS NOTE   Patient Name: Cassandra Ray Date of Encounter: 05/25/2018  Chief Complaint   No issues overnight  Patient Profile   Cassandra Ray is a 84 y.o. female with a hx of HTN, hypothyroidism, breast CA, GERD, diastolic dysfunction/probable chronic diastolic CHF, chronic hyponatremia, pancreatic cyst, questionable prior atrial fibrillation who is being seen today for the evaluation of possible atypical flutter versus atrial tachycardia at the request of Dr. Hal Hope  Subjective   No issues overnight. Echo reviewed today, shows elevated left and right heart filling pressures with preserved LVEF 60-65%. TSH suppressed, however, free T4 normal and T3 pending. Treating for atrial tachycardia. HR improved overnight with increased dose BB. On oral lasix 20 mg daily.  Objective   Vitals:   05/24/18 2027 05/25/18 0016 05/25/18 0520 05/25/18 0554  BP: 121/69 120/61 (!) 145/84   Pulse: 75 82 91 74  Resp: (!) 22 (!) 31 (!) 28 (!) 24  Temp: 98 F (36.7 C) 98.1 F (36.7 C) (!) 97.2 F (36.2 C)   TempSrc: Oral Oral Oral   SpO2: 96% 92% 95% 95%  Weight:    79.4 kg  Height:         Intake/Output Summary (Last 24 hours) at 05/25/2018 1111 Last data filed at 05/24/2018 1221 Gross per 24 hour  Intake -  Output 500 ml  Net -500 ml   Filed Weights   05/23/18 2356 05/24/18 0439 05/25/18 0554  Weight: 79.9 kg 79.9 kg 79.4 kg    Physical Exam   General appearance: alert and no distress Lungs: diminished breath sounds bilaterally and wheezes bilaterally and expiratory Heart: regular rate and rhythm Extremities: edema 1+ edema bilateral LE Neurologic: Grossly normal  Inpatient Medications    Scheduled Meds: . enoxaparin (LOVENOX) injection  30 mg Subcutaneous Q24H  . furosemide  20 mg Oral Daily  . levothyroxine  112 mcg Oral QAC breakfast  . lipase/protease/amylase  72,000 Units Oral TID WC  . losartan  50 mg Oral Daily  . metoprolol tartrate  100 mg Oral  BID  . pantoprazole  40 mg Oral Daily  . psyllium  1 packet Oral Daily    Continuous Infusions:   PRN Meds: acetaminophen **OR** acetaminophen, nitroGLYCERIN, ondansetron **OR** ondansetron (ZOFRAN) IV, zolpidem   Labs   Results for orders placed or performed during the hospital encounter of 05/23/18 (from the past 48 hour(s))  CBC with Differential     Status: Abnormal   Collection Time: 05/23/18  4:08 PM  Result Value Ref Range   WBC 11.4 (H) 4.0 - 10.5 K/uL   RBC 4.17 3.87 - 5.11 MIL/uL   Hemoglobin 12.8 12.0 - 15.0 g/dL   HCT 40.1 36.0 - 46.0 %   MCV 96.2 80.0 - 100.0 fL   MCH 30.7 26.0 - 34.0 pg   MCHC 31.9 30.0 - 36.0 g/dL   RDW 12.9 11.5 - 15.5 %   Platelets 192 150 - 400 K/uL   nRBC 0.0 0.0 - 0.2 %   Neutrophils Relative % 48 %   Neutro Abs 5.4 1.7 - 7.7 K/uL   Lymphocytes Relative 33 %   Lymphs Abs 3.7 0.7 - 4.0 K/uL   Monocytes Relative 16 %   Monocytes Absolute 1.9 (H) 0.1 - 1.0 K/uL   Eosinophils Relative 3 %   Eosinophils Absolute 0.4 0.0 - 0.5 K/uL   Basophils Relative 0 %   Basophils Absolute 0.1 0.0 - 0.1 K/uL   Immature Granulocytes 0 %  Abs Immature Granulocytes 0.04 0.00 - 0.07 K/uL    Comment: Performed at [facility], [place] [address] [place], [place] [address]  Basic metabolic panel     Status: Abnormal   Collection Time: 05/23/18  4:08 PM  Result Value Ref Range   Sodium 138 135 - 145 mmol/L   Potassium 3.1 (L) 3.5 - 5.1 mmol/L   Chloride 103 98 - 111 mmol/L   CO2 19 (L) 22 - 32 mmol/L   Glucose, Bld 127 (H) 70 - 99 mg/dL   BUN 37 (H) 8 - 23 mg/dL   Creatinine, Ser 1.53 (H) 0.44 - 1.00 mg/dL   Calcium 9.4 8.9 - 10.3 mg/dL   GFR calc non Af Amer 29 (L) >60 mL/min   GFR calc Af Amer 34 (L) >60 mL/min   Anion gap 16 (H) 5 - 15    Comment: Performed at [facility] [address], [place], [place] [address]  Brain natriuretic peptide     Status: Abnormal   Collection Time: 05/23/18  4:08 PM  Result Value Ref Range   B  Natriuretic Peptide 146.6 (H) 0.0 - 100.0 pg/mL    Comment: Performed at [facility] [address], [place], [place] [address]  I-Stat Troponin, ED (not at [facility])     Status: None   Collection Time: 05/23/18  4:10 PM  Result Value Ref Range   Troponin i, poc 0.01 0.00 - 0.08 ng/mL   Comment 3            Comment: Due to the release kinetics of cTnI, a negative result within the first hours of the onset of symptoms does not rule out myocardial infarction with certainty. If myocardial infarction is still suspected, repeat the test at appropriate intervals.   I-Stat Venous Blood Gas, ED (order at [facility] and [facility] only)     Status: Abnormal   Collection Time: 05/23/18  4:14 PM  Result Value Ref Range   pH, Ven 7.437 (H) 7.250 - 7.430   pCO2, Ven 31.5 (L) 44.0 - 60.0 mmHg   pO2, Ven 54.0 (H) 32.0 - 45.0 mmHg   Bicarbonate 21.3 20.0 - 28.0 mmol/L   TCO2 22 22 - 32 mmol/L   O2 Saturation 89.0 %   Acid-base deficit 2.0 0.0 - 2.0 mmol/L   Patient temperature HIDE    Sample type VENOUS   D-dimer, quantitative (not at [facility]     Status: Abnormal   Collection Time: 05/23/18  4:46 PM  Result Value Ref Range   D-Dimer, Quant 2.62 (H) 0.00 - 0.50 ug/mL-FEU    Comment: (NOTE) At the manufacturer cut-off of 0.50 ug/mL FEU, this assay has been documented to exclude PE with a sensitivity and negative predictive value of 97 to 99%.  At this time, this assay has not been approved by the FDA to exclude DVT/VTE. Results should be correlated with clinical presentation. Performed at [facility], [place] [address], [place], [place] [address]   CBC     Status: Abnormal   Collection Time: 05/24/18  2:20 AM  Result Value Ref Range   WBC 10.7 (H) 4.0 - 10.5 K/uL   RBC 3.63 (L) 3.87 - 5.11 MIL/uL   Hemoglobin 11.7 (L) 12.0 - 15.0 g/dL   HCT 33.9 (L) 36.0 - 46.0 %   MCV 93.4 80.0 - 100.0 fL   MCH 32.2 26.0 - 34.0 pg   MCHC 34.5 30.0 - 36.0 g/dL  RDW 12.9 11.5 - 15.5 %   Platelets 188 150 - 400  K/uL   nRBC 0.0 0.0 - 0.2 %    Comment: Performed at [facility], [place] [address], [place], [place] [address]  Creatinine, serum     Status: Abnormal   Collection Time: 05/24/18  2:20 AM  Result Value Ref Range   Creatinine, Ser 1.44 (H) 0.44 - 1.00 mg/dL   GFR calc non Af Amer 31 (L) >60 mL/min   GFR calc Af Amer 36 (L) >60 mL/min    Comment: Performed at [facility] [address] [place], [place] [address]  TSH     Status: Abnormal   Collection Time: 05/24/18  2:20 AM  Result Value Ref Range   TSH 0.171 (L) 0.350 - 4.500 uIU/mL    Comment: Performed by a 3rd Generation assay with a functional sensitivity of <=0.01 uIU/mL. Performed at [facility], [place] [address], [place], [place] [address]   Troponin I - Now Then Q6H     Status: Abnormal   Collection Time: 05/24/18  2:20 AM  Result Value Ref Range   Troponin I 0.10 (HH) <0.03 ng/mL    Comment: CRITICAL RESULT CALLED TO, READ BACK BY AND VERIFIED WITH: NIEMELA D,RN 05/24/18 0333 WAYK Performed at [facility], [place] [address], [place], [place] [address]   Hepatic function panel     Status: Abnormal   Collection Time: 05/24/18  2:20 AM  Result Value Ref Range   Total Protein 5.9 (L) 6.5 - 8.1 g/dL   Albumin 2.9 (L) 3.5 - 5.0 g/dL   AST 26 15 - 41 U/L   ALT 17 0 - 44 U/L   Alkaline Phosphatase 59 38 - 126 U/L   Total Bilirubin 0.8 0.3 - 1.2 mg/dL   Bilirubin, Direct 0.1 0.0 - 0.2 mg/dL   Indirect Bilirubin 0.7 0.3 - 0.9 mg/dL    Comment: Performed at [facility] [address], [place], [place] [address]  T4, free     Status: None   Collection Time: 05/24/18  2:20 AM  Result Value Ref Range   Free T4 1.58 0.82 - 1.77 ng/dL    Comment: (NOTE) Biotin ingestion may interfere with free T4 tests. If the results are inconsistent with the TSH level, previous test results, or the clinical presentation, then consider biotin interference. If needed, order repeat testing after stopping  biotin. Performed at [facility], [place] [address], [place], [place] [address]   T3, free     Status: None   Collection Time: 05/24/18  2:20 AM  Result Value Ref Range   T3, Free 2.3 2.0 - 4.4 pg/mL    Comment: (NOTE) Performed At: [facility], [place] [id number] Rush Farmer MD OE:[id number]   Basic metabolic panel     Status: Abnormal   Collection Time: 05/24/18  6:18 AM  Result Value Ref Range   Sodium 140 135 - 145 mmol/L   Potassium 3.8 3.5 - 5.1 mmol/L   Chloride 108 98 - 111 mmol/L   CO2 21 (L) 22 - 32 mmol/L   Glucose, Bld 110 (H) 70 - 99 mg/dL   BUN 27 (H) 8 - 23 mg/dL   Creatinine, Ser 1.37 (H) 0.44 - 1.00 mg/dL   Calcium 9.0 8.9 - 10.3 mg/dL   GFR calc non Af Amer 33 (L) >60 mL/min   GFR calc Af Amer 38 (L) >  60 mL/min   Anion gap 11 5 - 15    Comment: Performed at [facility] [address], [place], [place] [address]  CBC     Status: Abnormal   Collection Time: 05/24/18  6:18 AM  Result Value Ref Range   WBC 8.5 4.0 - 10.5 K/uL   RBC 3.52 (L) 3.87 - 5.11 MIL/uL   Hemoglobin 11.2 (L) 12.0 - 15.0 g/dL   HCT 33.1 (L) 36.0 - 46.0 %   MCV 94.0 80.0 - 100.0 fL   MCH 31.8 26.0 - 34.0 pg   MCHC 33.8 30.0 - 36.0 g/dL   RDW 13.2 11.5 - 15.5 %   Platelets 182 150 - 400 K/uL   nRBC 0.0 0.0 - 0.2 %    Comment: Performed at [facility], [place] [address] [place], [place] [address]  Troponin I - Now Then Q6H     Status: Abnormal   Collection Time: 05/24/18  6:18 AM  Result Value Ref Range   Troponin I 0.09 (HH) <0.03 ng/mL    Comment: CRITICAL VALUE NOTED.  VALUE IS CONSISTENT WITH PREVIOUSLY REPORTED AND CALLED VALUE. Performed at [facility], [place] [address] [place], [place] [address]   Troponin I - Now Then Q6H     Status: Abnormal   Collection Time: 05/24/18 11:49 AM  Result Value Ref Range   Troponin I 0.08 (HH) <0.03 ng/mL    Comment: CRITICAL VALUE NOTED.  VALUE IS CONSISTENT WITH PREVIOUSLY REPORTED  AND CALLED VALUE. Performed at [facility], [place] [address] [place], [place] [address]   CBC     Status: Abnormal   Collection Time: 05/25/18  6:47 AM  Result Value Ref Range   WBC 6.9 4.0 - 10.5 K/uL   RBC 3.89 3.87 - 5.11 MIL/uL   Hemoglobin 11.8 (L) 12.0 - 15.0 g/dL   HCT 37.3 36.0 - 46.0 %   MCV 95.9 80.0 - 100.0 fL   MCH 30.3 26.0 - 34.0 pg   MCHC 31.6 30.0 - 36.0 g/dL   RDW 13.2 11.5 - 15.5 %   Platelets 180 150 - 400 K/uL   nRBC 0.0 0.0 - 0.2 %    Comment: Performed at [facility], [place] [address], [place], [place] [address]  Basic metabolic panel     Status: Abnormal   Collection Time: 05/25/18  6:47 AM  Result Value Ref Range   Sodium 141 135 - 145 mmol/L   Potassium 4.0 3.5 - 5.1 mmol/L   Chloride 107 98 - 111 mmol/L   CO2 24 22 - 32 mmol/L   Glucose, Bld 103 (H) 70 - 99 mg/dL   BUN 23 8 - 23 mg/dL   Creatinine, Ser 1.49 (H) 0.44 - 1.00 mg/dL   Calcium 9.1 8.9 - 10.3 mg/dL   GFR calc non Af Amer 30 (L) >60 mL/min   GFR calc Af Amer 35 (L) >60 mL/min   Anion gap 10 5 - 15    Comment: Performed at [facility], [place] [address] [place],  [address]    ECG   N/A  Telemetry   Sinus rhythm - Personally Reviewed  Radiology    Nm Pulmonary Vent And Perf (v/q Scan)  Result Date: 05/23/2018 CLINICAL DATA:  Shortness of breath and elevated D-dimer. EXAM: NUCLEAR MEDICINE VENTILATION - PERFUSION LUNG SCAN TECHNIQUE: Ventilation images were obtained in multiple projections using inhaled aerosol Tc-51m DTPA. Perfusion images were obtained  in multiple projections after intravenous injection of Tc-14m MAA. RADIOPHARMACEUTICALS:  30 mCi of Tc-38m DTPA aerosol inhalation and 4 mCi Tc27m MAA IV COMPARISON:  Chest radiograph 05/23/2018 FINDINGS: Ventilation: Mostly homogeneous distribution tracer activity throughout the lungs with residual tracer pooling in the central bronchi. Perfusion: Homogeneous distribution of tracer activity throughout the lungs  without focal defect. IMPRESSION: Very low probability of pulmonary embolus. Electronically Signed   By: Lucienne Capers M.D.   On: 05/23/2018 23:06   Dg Chest Portable 1 View  Result Date: 05/23/2018 CLINICAL DATA:  Short of breath.  History of breast cancer EXAM: PORTABLE CHEST 1 VIEW COMPARISON:  02/01/2016 FINDINGS: Chronic elevation right hemidiaphragm. Chronic scarring or atelectasis in the right lung base Negative for heart failure or pneumonia. Mild left lower lobe atelectasis Surgical clips in the right breast and right axilla IMPRESSION: Mild bibasilar atelectasis.  Chronic elevation right hemidiaphragm. Electronically Signed   By: Franchot Gallo M.D.   On: 05/23/2018 16:29    Cardiac Studies   LV EF: 60% -   65%  ------------------------------------------------------------------- Indications:      Murmur 785.2.  ------------------------------------------------------------------- History:   PMH:   Atrial fibrillation.  Congestive heart failure. Risk factors:  Hypertension.  ------------------------------------------------------------------- Study Conclusions  - Left ventricle: The cavity size was normal. Wall thickness was   increased in a pattern of mild LVH. There was moderate focal   basal hypertrophy of the septum. No significant LVOT obstruction,   however minimally increased LVOT velocity. Systolic function was   normal. The estimated ejection fraction was in the range of 60%   to 65%. Wall motion was normal; there were no regional wall   motion abnormalities. Doppler parameters are consistent with   abnormal left ventricular relaxation (grade 1 diastolic   dysfunction). The Ee&' ratio is >15, suggesting elevated LV   filling pressure. - Mitral valve: Calcified annulus. There was trivial regurgitation.   Valve area by continuity equation (using LVOT flow): 1.67 cm^2. - Left atrium: The atrium was normal in size. - Right atrium: The atrium was mildly dilated. -  Tricuspid valve: There was mild regurgitation. - Pulmonary arteries: PA peak pressure: 44 mm Hg (S) + RAP. - Systemic veins: Not visualized.  Impressions:  - Compared to a prior study in 2017, the LVEF is stable. LV filling   pressure is elevated with moderate pulmonary hypertension.  Assessment   Principal Problem:   Acute respiratory failure with hypoxia (HCC) Active Problems:   Hypothyroidism   Shortness of breath   Chronic diastolic heart failure (HCC)   Tachycardia   Plan   1. In normal sinus today - rates in the 70's. Taken off diltiazem in favor of metoprolol - but ordered for 50 mg BID yesterday (never received this) and then supposed to go up to 100 mg BID today. I agree this is too much. Will decrease to 50 mg BID today. She is volume overloaded by echo and exam. Increase po lasix to 40 mg daily. She wants to go home. I think this is reasonable if she has close follow-up in 7-10 days with Dr. [name] or APP at [facility].  Time Spent Directly with Patient:  I have spent a total of 25 minutes with the patient reviewing hospital notes, telemetry, EKGs, labs and examining the patient as well as establishing an assessment and plan that was discussed personally with the patient.  > 50% of time was spent in direct patient care.  Length of Stay:  LOS: 0  days   Pixie Casino, MD, [facility], [place] Director of the Advanced Lipid Disorders &  Cardiovascular Risk Reduction Clinic Diplomate of the American Board of Clinical Lipidology Attending Cardiologist  Direct Dial: [phone]  Fax: [phone]  Website:  [email or web address] Noyce 05/25/2018, 11:11 AM

## 2018-05-25 NOTE — Care Management Obs Status (Signed)
[facility] NOTIFICATION   Patient Details  Name: Cassandra Ray MRN: [id number] Date of Birth: 25-Jun-1922   Medicare Observation Status Notification Given:  Yes    Royston Bake, RN 05/25/2018, 1:39 PM

## 2018-05-25 NOTE — Progress Notes (Signed)
Discharged to home with family office visits in place teaching done  

## 2018-05-27 LAB — T3: T3 TOTAL: 76 ng/dL (ref 71–180)

## 2018-05-27 NOTE — Discharge Summary (Signed)
Physician Discharge Summary  Cassandra Ray:[id number] DOB: 12-22-1922 DOA: 05/23/2018  PCP: Cassandra Cruel, MD  Admit date: 05/23/2018 Discharge date: 05/25/2018  Admitted From: Home.  Disposition: Home  Recommendations for Outpatient Follow-up:  1. Follow up with PCP in 1-2 weeks 2. Please obtain BMP/CBC in one week 3. Please follow up with cardiology as recommended.     Discharge Condition: stable.  CODE STATUS: full code.  Diet recommendation: Heart Healthy   Brief/Interim Summary:  Cassandra Ray is a 85 y.o. female with history of diastolic dysfunction presents to the ER with complaint of increasing shortness of breath over the last 2 days. She was found to have atrial tachycardia/ atrial fib on telemetry, cardiology was consulted.  Discharge Diagnoses:  Principal Problem:   Acute respiratory failure with hypoxia (HCC) Active Problems:   Hypothyroidism   Shortness of breath   Chronic diastolic heart failure (HCC)   Tachycardia  Acute respiratory failure with hypoxia:  Resolved with rate control.    Atrial fib/ atrial tachycardia: She was started on metoprolol and discharged on the same.    Acute on chronic diastolic heart failure:  Mildly fluid overload on exam.  Increased lasix from 20 mg daily to 40 mg daily.  Recommend outpatient follow up  with cardiology in one week as suggested.    Hypothyroidism: Resume synthroid.  Free t4 and t3 are wnl.    Discharge Instructions  Discharge Instructions    Diet - low sodium heart healthy   Complete by:  As directed    Increase activity slowly   Complete by:  As directed      Allergies as of 05/25/2018      Reactions   Tape Other (See Comments)   Patient prefers easy-release tape      Medication List    STOP taking these medications   diltiazem 180 MG 24 hr capsule Commonly known as:  CARDIZEM CD     TAKE these medications   acetaminophen 500 MG tablet Commonly known as:   TYLENOL Take 500-1,000 mg by mouth every 8 (eight) hours as needed for mild pain or headache.   ALIGN 4 MG Caps Take 4 mg by mouth daily.   B-complex with vitamin C tablet Take 1 tablet by mouth daily.   CITRACAL +D3 PO Take 1 tablet by mouth daily.   CREON 36000 UNITS Cpep capsule Generic drug:  lipase/protease/amylase TAKE 2 CAPSULES WITH MEALS AND 1 CAPSULE WITH SNACKS. What changed:  See the new instructions.   furosemide 40 MG tablet Commonly known as:  LASIX Take 1 tablet (40 mg total) by mouth daily. What changed:    medication strength  how much to take   Glucosamine HCl 1500 MG Tabs Take 1,500 mg by mouth 2 (two) times daily.   levothyroxine 112 MCG tablet Commonly known as:  SYNTHROID, LEVOTHROID Take 112 mcg by mouth daily before breakfast.   loperamide 2 MG tablet Commonly known as:  IMODIUM A-D Take 2 mg by mouth as needed for diarrhea or loose stools.   losartan 50 MG tablet Commonly known as:  COZAAR Take 50 mg by mouth daily.   Melatonin 5 MG Tabs Take 5 mg by mouth at bedtime as needed (for sleep).   metoprolol tartrate 50 MG tablet Commonly known as:  LOPRESSOR Take 1 tablet (50 mg total) by mouth 2 (two) times daily.   nitroGLYCERIN 0.4 MG SL tablet Commonly known as:  NITROSTAT Place 0.4 mg under the tongue every  5 (five) minutes as needed for chest pain.   pantoprazole 40 MG tablet Commonly known as:  PROTONIX Take 40 mg by mouth daily.   SYMBICORT 80-4.5 MCG/ACT inhaler Generic drug:  budesonide-formoterol Take 2 puffs by mouth every morning.   Vitamin D3 50 MCG (2000 UT) Tabs Take 2,000 Units by mouth daily.      Follow-up Information    Cassandra Cruel, MD. Schedule an appointment as soon as possible for a visit in 1 week(s).   Specialty:  Family Medicine Contact information: [address] [place] [address] [phone]        Jerline Pain, MD. Schedule an appointment as soon as possible for a visit in 1  week(s).   Specialty:  Cardiology Contact information: [address] [place] [address] [phone]          Allergies  Allergen Reactions  . Tape Other (See Comments)    Patient prefers easy-release tape    Consultations:  Cardiology.    Procedures/Studies: Nm Pulmonary Vent And Perf (v/q Scan)  Result Date: 05/23/2018 CLINICAL DATA:  Shortness of breath and elevated D-dimer. EXAM: NUCLEAR MEDICINE VENTILATION - PERFUSION LUNG SCAN TECHNIQUE: Ventilation images were obtained in multiple projections using inhaled aerosol Tc-54m DTPA. Perfusion images were obtained in multiple projections after intravenous injection of Tc-23m MAA. RADIOPHARMACEUTICALS:  30 mCi of Tc-82m DTPA aerosol inhalation and 4 mCi Tc89m MAA IV COMPARISON:  Chest radiograph 05/23/2018 FINDINGS: Ventilation: Mostly homogeneous distribution tracer activity throughout the lungs with residual tracer pooling in the central bronchi. Perfusion: Homogeneous distribution of tracer activity throughout the lungs without focal defect. IMPRESSION: Very low probability of pulmonary embolus. Electronically Signed   By: Lucienne Capers M.D.   On: 05/23/2018 23:06   Dg Chest Portable 1 View  Result Date: 05/23/2018 CLINICAL DATA:  Short of breath.  History of breast cancer EXAM: PORTABLE CHEST 1 VIEW COMPARISON:  02/01/2016 FINDINGS: Chronic elevation right hemidiaphragm. Chronic scarring or atelectasis in the right lung base Negative for heart failure or pneumonia. Mild left lower lobe atelectasis Surgical clips in the right breast and right axilla IMPRESSION: Mild bibasilar atelectasis.  Chronic elevation right hemidiaphragm. Electronically Signed   By: Franchot Gallo M.D.   On: 05/23/2018 16:29       Subjective: No new complaints of sob, chest pain.   Discharge Exam: Vitals:   05/25/18 0554 05/25/18 1100  BP:  (!) 120/55  Pulse: 74 (!) 101  Resp: (!) 24 (!) 24  Temp:  98 F (36.7 C)  SpO2:  95% 98%   Vitals:   05/25/18 0016 05/25/18 0520 05/25/18 0554 05/25/18 1100  BP: 120/61 (!) 145/84  (!) 120/55  Pulse: 82 91 74 (!) 101  Resp: (!) 31 (!) 28 (!) 24 (!) 24  Temp: 98.1 F (36.7 C) (!) 97.2 F (36.2 C)  98 F (36.7 C)  TempSrc: Oral Oral  Oral  SpO2: 92% 95% 95% 98%  Weight:   79.4 kg   Height:        General: Pt is alert, awake, not in acute distress Cardiovascular: RRR, S1/S2 +, no rubs, no gallops Respiratory: CTA bilaterally, no wheezing, no rhonchi Abdominal: Soft, NT, ND, bowel sounds + Extremities: no edema, no cyanosis    The results of significant diagnostics from this hospitalization (including imaging, microbiology, ancillary and laboratory) are listed below for reference.     Microbiology: No results found for this or any previous visit (from the past  240 hour(s)).   Labs: BNP (last 3 results) Recent Labs    05/23/18 1608  BNP 132.4*   Basic Metabolic Panel: Recent Labs  Lab 05/23/18 1608 05/24/18 0220 05/24/18 0618 05/25/18 0647  NA 138  --  140 141  K 3.1*  --  3.8 4.0  CL 103  --  108 107  CO2 19*  --  21* 24  GLUCOSE 127*  --  110* 103*  BUN 37*  --  27* 23  CREATININE 1.53* 1.44* 1.37* 1.49*  CALCIUM 9.4  --  9.0 9.1   Liver Function Tests: Recent Labs  Lab 05/24/18 0220  AST 26  ALT 17  ALKPHOS 59  BILITOT 0.8  PROT 5.9*  ALBUMIN 2.9*   No results for input(s): LIPASE, AMYLASE in the last 168 hours. No results for input(s): AMMONIA in the last 168 hours. CBC: Recent Labs  Lab 05/23/18 1608 05/24/18 0220 05/24/18 0618 05/25/18 0647  WBC 11.4* 10.7* 8.5 6.9  NEUTROABS 5.4  --   --   --   HGB 12.8 11.7* 11.2* 11.8*  HCT 40.1 33.9* 33.1* 37.3  MCV 96.2 93.4 94.0 95.9  PLT 192 188 182 180   Cardiac Enzymes: Recent Labs  Lab 05/24/18 0220 05/24/18 0618 05/24/18 1149  TROPONINI 0.10* 0.09* 0.08*   BNP: Invalid input(s): POCBNP CBG: No results for input(s): GLUCAP in the last 168 hours. D-Dimer No  results for input(s): DDIMER in the last 72 hours. Hgb A1c No results for input(s): HGBA1C in the last 72 hours. Lipid Profile No results for input(s): CHOL, HDL, LDLCALC, TRIG, CHOLHDL, LDLDIRECT in the last 72 hours. Thyroid function studies No results for input(s): TSH, T4TOTAL, T3FREE, THYROIDAB in the last 72 hours.  Invalid input(s): FREET3 Anemia work up No results for input(s): VITAMINB12, FOLATE, FERRITIN, TIBC, IRON, RETICCTPCT in the last 72 hours. Urinalysis    Component Value Date/Time   COLORURINE YELLOW 04/24/2014 2257   APPEARANCEUR CLEAR 04/24/2014 2257   LABSPEC 1.009 04/24/2014 2257   PHURINE 7.5 04/24/2014 2257   GLUCOSEU NEGATIVE 04/24/2014 2257   HGBUR NEGATIVE 04/24/2014 2257   BILIRUBINUR NEGATIVE 04/24/2014 2257   KETONESUR NEGATIVE 04/24/2014 2257   PROTEINUR NEGATIVE 04/24/2014 2257   UROBILINOGEN 0.2 04/24/2014 2257   NITRITE NEGATIVE 04/24/2014 2257   LEUKOCYTESUR NEGATIVE 04/24/2014 2257   Sepsis Labs Invalid input(s): PROCALCITONIN,  WBC,  LACTICIDVEN Microbiology No results found for this or any previous visit (from the past 240 hour(s)).   Time coordinating discharge: 34 minutes  SIGNED:   Hosie Poisson, MD  [facility] 05/27/2018, 10:25 AM Pager   If 7PM-7AM, please contact night-coverage [email or web address] Password [email or web address]

## 2018-06-03 ENCOUNTER — Ambulatory Visit: Payer: Medicare Other | Admitting: Cardiology

## 2018-06-26 ENCOUNTER — Encounter

## 2018-06-26 ENCOUNTER — Encounter: Payer: Self-pay | Admitting: Nurse Practitioner

## 2018-06-26 ENCOUNTER — Ambulatory Visit: Payer: Medicare Other | Admitting: Nurse Practitioner

## 2018-06-26 VITALS — BP 120/62 | HR 70 | Ht 64.0 in | Wt 173.8 lb

## 2018-06-26 DIAGNOSIS — Z87898 Personal history of other specified conditions: Secondary | ICD-10-CM

## 2018-06-26 NOTE — Patient Instructions (Addendum)
We will be checking the following labs today - NONE   Medication Instructions:    Continue with your current medicines.    If you need a refill on your cardiac medications before your next appointment, please call your pharmacy.     Testing/Procedures To Be Arranged:  N/A  Follow-Up:   See Melina Copa, PA in June as planned.     At [facility], you and your health needs are our priority.  As part of our continuing mission to provide you with exceptional heart care, we have created designated Provider Care Teams.  These Care Teams include your primary Cardiologist (physician) and Advanced Practice Providers (APPs -  Physician Assistants and Nurse Practitioners) who all work together to provide you with the care you need, when you need it.  Special Instructions:  . None  Call the [place] office at [phone] if you have any questions, problems or concerns.

## 2018-06-26 NOTE — Progress Notes (Signed)
CARDIOLOGY OFFICE NOTE  Date:  06/26/2018    Cassandra Ray Date of Birth: Mar 12, 1923 Medical Record #[id number]  PCP:  Cassandra Cruel, MD  Cardiologist:  Cassandra Ray  Chief Complaint  Patient presents with  . Follow-up    Post Ray visit - seen for Dr. Marlou Ray    History of Present Illness: Cassandra Ray is a 85 y.o. female who presents today for a follow up visit. Seen for Dr. Marlou Ray.   She has a history of diastolic dysfunction, chronic hyponatremia, prior pleural effusion, pancreatic cyst,  breast cancer, hypothyroidism and prior AF. In 2014 she was hospitalized in [place] with N, V and weakness after eating seafood.  Stress test showed no ischemia.  EF normal.  Mild LVH. Diagnosed with A. fib with RVR and was given IV Lopressor.  However upon close review of the strips there did appear to be P waves present.  Event monitor in 2015 showed no evidence of A. Fib.  Last seen here in mid December. She endorsed lots of fatigue - was on high dose metoprolol - this was changed to CCB.   Shortly thereafter, she presented to the ER with complaint of increasing shortness of breath.  She was found to have atrial tachycardia (narrow complex) with rates in the 150's on telemetry along with acute respiratory failure. No AF or atrial flutter was identified. Cardiology was consulted. Metoprolol was restarted. Lasix was increased. To consider EP referral to explore other agents that might control her tachycardia.   Comes in today. Here with her husband. She feels much better. On less beta blocker and feels fine. She remains fatigued but says "you know I'm 89". Doing some chair exercises three times a week. No chest pain. HR is fine. No palpitations.   Past Medical History:  Diagnosis Date  . Actinic keratoses   . Arthritis    back   . Atrial fibrillation ([place])    a. questionable episode in 2015 in [facility]. b. event monitor through December/January 2014/2015 which showed no  evidence of atrial fibrillation.  . Breast cancer ([place])    Breast cancer (right)  . Chronic diastolic CHF (congestive heart failure) ([place])   . Compression fracture of L3 lumbar vertebra   . Dysphagia   . GERD (gastroesophageal reflux disease)    takes otc Pepcid as needed  . Hiatal hernia 11/27/2014  . HTN (hypertension)   . Hypothyroidism   . Insomnia   . Osteopenia   . Pleural effusion     Past Surgical History:  Procedure Laterality Date  . BREAST LUMPECTOMY Right 1995   lumpectomy  . COLONOSCOPY    . EYE SURGERY Bilateral    cataract w/ lens implant  . KYPHOPLASTY N/A 09/18/2013   Procedure: KYPHOPLASTY;  Surgeon: Sinclair Ship, MD;  Location: [place];  Service: Orthopedics;  Laterality: N/A;  Lumbar 3 kyphoplasty  . TONSILLECTOMY       Medications: Current Meds  Medication Sig  . acetaminophen (TYLENOL) 500 MG tablet Take 500-1,000 mg by mouth every 8 (eight) hours as needed for mild pain or headache.  . B Complex-C (B-COMPLEX WITH VITAMIN C) tablet Take 1 tablet by mouth daily.  . Calcium-Phosphorus-Vitamin D (CITRACAL +D3 PO) Take 1 tablet by mouth daily.  . Cholecalciferol (VITAMIN D3) 2000 units TABS Take 2,000 Units by mouth daily.   . furosemide (LASIX) 40 MG tablet Take 1 tablet (40 mg total) by mouth daily.  . Glucosamine HCl 1500 MG  TABS Take 1,500 mg by mouth 2 (two) times daily.   Marland Kitchen levothyroxine (SYNTHROID, LEVOTHROID) 112 MCG tablet Take 112 mcg by mouth daily before breakfast.  . lipase/protease/amylase (CREON) 12000 units CPEP capsule Take 72,000 Units by mouth 2 (two) times daily.  Marland Kitchen loperamide (IMODIUM A-D) 2 MG tablet Take 2 mg by mouth as needed for diarrhea or loose stools.  Marland Kitchen losartan (COZAAR) 50 MG tablet Take 50 mg by mouth daily.  . Melatonin 5 MG TABS Take 5 mg by mouth at bedtime as needed (for sleep).   . metoprolol tartrate (LOPRESSOR) 50 MG tablet Take 1 tablet (50 mg total) by mouth 2 (two) times daily.  . nitroGLYCERIN (NITROSTAT)  0.4 MG SL tablet Place 0.4 mg under the tongue every 5 (five) minutes as needed for chest pain.  . pantoprazole (PROTONIX) 40 MG tablet Take 40 mg by mouth daily.  . Probiotic Product (ALIGN) 4 MG CAPS Take 4 mg by mouth daily.  . SYMBICORT 80-4.5 MCG/ACT inhaler Take 2 puffs by mouth every morning.   . [DISCONTINUED] CREON 36000 units CPEP capsule TAKE 2 CAPSULES WITH MEALS AND 1 CAPSULE WITH SNACKS. (Patient taking differently: Take 72,000 Units by mouth 3 (three) times daily with meals. )     Allergies: Allergies  Allergen Reactions  . Tape Other (See Comments)    Patient prefers easy-release tape    Social History: The patient  reports that she has quit smoking. She has never used smokeless tobacco. She reports current alcohol use of about 7.0 standard drinks of alcohol per week. She reports that she does not use drugs.   Family History: The patient's family history includes CAD in her mother; Diabetes Mellitus II in her mother; Heart disease in her sister; Heart failure in her father and mother; Kidney cancer in her brother; Prostate cancer in her brother.   Review of Systems: Please see the history of present illness.   Otherwise, the review of systems is positive for none.   All other systems are reviewed and negative.   Physical Exam: VS:  BP 120/62 (BP Location: Left Arm, Patient Position: Sitting, Cuff Size: Normal)   Pulse 70   Ht 5\' 4"  (1.626 m)   Wt 173 lb 12.8 oz (78.8 kg)   LMP  (LMP Unknown)   BMI 29.83 kg/m  .  BMI Body mass index is 29.83 kg/m.  Wt Readings from Last 3 Encounters:  06/26/18 173 lb 12.8 oz (78.8 kg)  05/25/18 175 lb 0.7 oz (79.4 kg)  05/21/18 174 lb 12.8 oz (79.3 kg)    General: Pleasant. Elderly female. Alert and in no acute distress.   HEENT: Normal.  Neck: Supple, no JVD, carotid bruits, or masses noted.  Cardiac: Regular rate and rhythm. No murmurs, rubs, or gallops. No edema.  Respiratory:  Lungs are clear to auscultation bilaterally  with normal work of breathing.  GI: Soft and nontender.  MS: No deformity or atrophy. Gait and ROM intact.  Skin: Warm and dry. Color is normal.  Neuro:  Strength and sensation are intact and no gross focal deficits noted.  Psych: Alert, appropriate and with normal affect.   LABORATORY DATA:  EKG:  EKG is ordered today. This demonstrates NSR with 1st degree AV block. HR is 70 today.   Lab Results  Component Value Date   WBC 6.9 05/25/2018   HGB 11.8 (L) 05/25/2018   HCT 37.3 05/25/2018   PLT 180 05/25/2018   GLUCOSE 103 (H) 05/25/2018  ALT 17 05/24/2018   AST 26 05/24/2018   NA 141 05/25/2018   K 4.0 05/25/2018   CL 107 05/25/2018   CREATININE 1.49 (H) 05/25/2018   BUN 23 05/25/2018   CO2 24 05/25/2018   TSH 0.171 (L) 05/24/2018   INR 0.99 09/17/2013     BNP (last 3 results) Recent Labs    05/23/18 1608  BNP 146.6*    ProBNP (last 3 results) No results for input(s): PROBNP in the last 8760 hours.   Other Studies Reviewed Today:  Echo Study Conclusions 05/2018 - Left ventricle: The cavity size was normal. Wall thickness was   increased in a pattern of mild LVH. There was moderate focal   basal hypertrophy of the septum. No significant LVOT obstruction,   however minimally increased LVOT velocity. Systolic function was   normal. The estimated ejection fraction was in the range of 60%   to 65%. Wall motion was normal; there were no regional wall   motion abnormalities. Doppler parameters are consistent with   abnormal left ventricular relaxation (grade 1 diastolic   dysfunction). The Ee&' ratio is >15, suggesting elevated LV   filling pressure. - Mitral valve: Calcified annulus. There was trivial regurgitation.   Valve area by continuity equation (using LVOT flow): 1.67 cm^2. - Left atrium: The atrium was normal in size. - Right atrium: The atrium was mildly dilated. - Tricuspid valve: There was mild regurgitation. - Pulmonary arteries: PA peak pressure: 44  mm Hg (S) + RAP. - Systemic veins: Not visualized.  Impressions:  - Compared to a prior study in 2017, the LVEF is stable. LV filling   pressure is elevated with moderate pulmonary hypertension.  Assessment/Plan:  1. Atrial tach - with elevated rates - now back on Metoprolol - she has not had documented AF - she is in NSR today - HR is fine. No changes made. She wishes to stay with her current regimen.   2. Persistent fatigue - most likely multifactorial - she is encouraged to keep up her activity level as best she can.   3. Chronic diastolic HF - looks stable. No changes made today.   4. HTN - BP is fine - no changes made.   5. History of pleural efusion - not discussed.   6. CKD - not discussed.   Current medicines are reviewed with the patient today.  The patient does not have concerns regarding medicines other than what has been noted above.  The following changes have been made:  See above.  Labs/ tests ordered today include:    Orders Placed This Encounter  Procedures  . EKG 12-Lead     Disposition:   FU with Melina Copa PA per recall scheduled for June.    Patient is agreeable to this plan and will call if any problems develop in the interim.   SignedTruitt Merle, NP  06/26/2018 2:58 PM  [facility] [address] [place], [place]  [address] Phone: [phone] Fax: [phone]

## 2018-09-04 ENCOUNTER — Ambulatory Visit: Payer: Medicare Other | Admitting: Podiatry

## 2018-12-12 ENCOUNTER — Inpatient Hospital Stay (HOSPITAL_COMMUNITY)
Admission: EM | Admit: 2018-12-12 | Discharge: 2018-12-17 | DRG: 308 | Disposition: A | Discharge status: Home Health | Payer: Medicare Other | Attending: Internal Medicine | Admitting: Internal Medicine

## 2018-12-12 ENCOUNTER — Other Ambulatory Visit: Payer: Self-pay

## 2018-12-12 ENCOUNTER — Emergency Department (HOSPITAL_COMMUNITY): Payer: Medicare Other

## 2018-12-12 ENCOUNTER — Encounter (HOSPITAL_COMMUNITY): Payer: Self-pay | Admitting: Emergency Medicine

## 2018-12-12 DIAGNOSIS — J90 Pleural effusion, not elsewhere classified: Secondary | ICD-10-CM | POA: Diagnosis present

## 2018-12-12 DIAGNOSIS — I5033 Acute on chronic diastolic (congestive) heart failure: Secondary | ICD-10-CM | POA: Diagnosis not present

## 2018-12-12 DIAGNOSIS — E039 Hypothyroidism, unspecified: Secondary | ICD-10-CM | POA: Diagnosis present

## 2018-12-12 DIAGNOSIS — G4700 Insomnia, unspecified: Secondary | ICD-10-CM | POA: Diagnosis present

## 2018-12-12 DIAGNOSIS — I4891 Unspecified atrial fibrillation: Secondary | ICD-10-CM | POA: Diagnosis not present

## 2018-12-12 DIAGNOSIS — Z20828 Contact with and (suspected) exposure to other viral communicable diseases: Secondary | ICD-10-CM | POA: Diagnosis present

## 2018-12-12 DIAGNOSIS — R531 Weakness: Secondary | ICD-10-CM | POA: Insufficient documentation

## 2018-12-12 DIAGNOSIS — N183 Chronic kidney disease, stage 3 (moderate): Secondary | ICD-10-CM | POA: Diagnosis present

## 2018-12-12 DIAGNOSIS — N179 Acute kidney failure, unspecified: Secondary | ICD-10-CM | POA: Diagnosis present

## 2018-12-12 DIAGNOSIS — K219 Gastro-esophageal reflux disease without esophagitis: Secondary | ICD-10-CM | POA: Insufficient documentation

## 2018-12-12 DIAGNOSIS — I480 Paroxysmal atrial fibrillation: Secondary | ICD-10-CM | POA: Diagnosis not present

## 2018-12-12 DIAGNOSIS — R0602 Shortness of breath: Secondary | ICD-10-CM

## 2018-12-12 DIAGNOSIS — Z853 Personal history of malignant neoplasm of breast: Secondary | ICD-10-CM

## 2018-12-12 DIAGNOSIS — Z833 Family history of diabetes mellitus: Secondary | ICD-10-CM

## 2018-12-12 DIAGNOSIS — Z87891 Personal history of nicotine dependence: Secondary | ICD-10-CM

## 2018-12-12 DIAGNOSIS — I272 Pulmonary hypertension, unspecified: Secondary | ICD-10-CM | POA: Diagnosis present

## 2018-12-12 DIAGNOSIS — I10 Essential (primary) hypertension: Secondary | ICD-10-CM | POA: Diagnosis present

## 2018-12-12 DIAGNOSIS — E876 Hypokalemia: Secondary | ICD-10-CM | POA: Diagnosis present

## 2018-12-12 DIAGNOSIS — D72828 Other elevated white blood cell count: Secondary | ICD-10-CM | POA: Diagnosis present

## 2018-12-12 DIAGNOSIS — Z66 Do not resuscitate: Secondary | ICD-10-CM | POA: Diagnosis present

## 2018-12-12 DIAGNOSIS — Z7951 Long term (current) use of inhaled steroids: Secondary | ICD-10-CM

## 2018-12-12 DIAGNOSIS — I248 Other forms of acute ischemic heart disease: Secondary | ICD-10-CM | POA: Diagnosis present

## 2018-12-12 DIAGNOSIS — N189 Chronic kidney disease, unspecified: Secondary | ICD-10-CM | POA: Diagnosis present

## 2018-12-12 DIAGNOSIS — I130 Hypertensive heart and chronic kidney disease with heart failure and stage 1 through stage 4 chronic kidney disease, or unspecified chronic kidney disease: Secondary | ICD-10-CM | POA: Diagnosis present

## 2018-12-12 DIAGNOSIS — Z8249 Family history of ischemic heart disease and other diseases of the circulatory system: Secondary | ICD-10-CM

## 2018-12-12 DIAGNOSIS — D72829 Elevated white blood cell count, unspecified: Secondary | ICD-10-CM | POA: Diagnosis present

## 2018-12-12 DIAGNOSIS — D649 Anemia, unspecified: Secondary | ICD-10-CM | POA: Diagnosis present

## 2018-12-12 DIAGNOSIS — I440 Atrioventricular block, first degree: Secondary | ICD-10-CM | POA: Diagnosis present

## 2018-12-12 DIAGNOSIS — E0580 Other thyrotoxicosis without thyrotoxic crisis or storm: Secondary | ICD-10-CM | POA: Diagnosis present

## 2018-12-12 LAB — COMPREHENSIVE METABOLIC PANEL
ALT: 21 U/L (ref 0–44)
AST: 31 U/L (ref 15–41)
Albumin: 3.1 g/dL — ABNORMAL LOW (ref 3.5–5.0)
Alkaline Phosphatase: 74 U/L (ref 38–126)
Anion gap: 13 (ref 5–15)
BUN: 46 mg/dL — ABNORMAL HIGH (ref 8–23)
CO2: 22 mmol/L (ref 22–32)
Calcium: 9.1 mg/dL (ref 8.9–10.3)
Chloride: 101 mmol/L (ref 98–111)
Creatinine, Ser: 2.26 mg/dL — ABNORMAL HIGH (ref 0.44–1.00)
GFR calc Af Amer: 21 mL/min — ABNORMAL LOW (ref >60)
GFR calc non Af Amer: 18 mL/min — ABNORMAL LOW (ref >60)
Glucose, Bld: 98 mg/dL (ref 70–99)
Potassium: 4 mmol/L (ref 3.5–5.1)
Sodium: 136 mmol/L (ref 135–145)
Total Bilirubin: 1.1 mg/dL (ref 0.3–1.2)
Total Protein: 6.4 g/dL — ABNORMAL LOW (ref 6.5–8.1)

## 2018-12-12 LAB — URINALYSIS, ROUTINE W REFLEX MICROSCOPIC
Bilirubin Urine: NEGATIVE
Glucose, UA: NEGATIVE mg/dL
Hgb urine dipstick: NEGATIVE
Ketones, ur: NEGATIVE mg/dL
Leukocytes,Ua: NEGATIVE
Nitrite: NEGATIVE
Protein, ur: NEGATIVE mg/dL
Specific Gravity, Urine: 1.006 (ref 1.005–1.030)
pH: 6 (ref 5.0–8.0)

## 2018-12-12 LAB — CBC WITH DIFFERENTIAL/PLATELET
Abs Immature Granulocytes: 0.06 10*3/uL (ref 0.00–0.07)
Basophils Absolute: 0 10*3/uL (ref 0.0–0.1)
Basophils Relative: 0 %
Eosinophils Absolute: 0.3 10*3/uL (ref 0.0–0.5)
Eosinophils Relative: 2 %
HCT: 38.6 % (ref 36.0–46.0)
Hemoglobin: 13 g/dL (ref 12.0–15.0)
Immature Granulocytes: 1 %
Lymphocytes Relative: 16 %
Lymphs Abs: 2 10*3/uL (ref 0.7–4.0)
MCH: 31.7 pg (ref 26.0–34.0)
MCHC: 33.7 g/dL (ref 30.0–36.0)
MCV: 94.1 fL (ref 80.0–100.0)
Monocytes Absolute: 2 10*3/uL — ABNORMAL HIGH (ref 0.1–1.0)
Monocytes Relative: 15 %
Neutro Abs: 8.6 10*3/uL — ABNORMAL HIGH (ref 1.7–7.7)
Neutrophils Relative %: 66 %
Platelets: 175 10*3/uL (ref 150–400)
RBC: 4.1 MIL/uL (ref 3.87–5.11)
RDW: 13.2 % (ref 11.5–15.5)
WBC: 13 10*3/uL — ABNORMAL HIGH (ref 4.0–10.5)
nRBC: 0 % (ref 0.0–0.2)

## 2018-12-12 LAB — TROPONIN I (HIGH SENSITIVITY)
Troponin I (High Sensitivity): 67 ng/L — ABNORMAL HIGH (ref <18)
Troponin I (High Sensitivity): 60 ng/L — ABNORMAL HIGH (ref <18)

## 2018-12-12 LAB — MRSA PCR SCREENING: MRSA by PCR: NEGATIVE

## 2018-12-12 LAB — SARS CORONAVIRUS 2 BY RT PCR (HOSPITAL ORDER, PERFORMED IN ~~LOC~~ HOSPITAL LAB): SARS Coronavirus 2: NEGATIVE

## 2018-12-12 LAB — CREATININE, URINE, RANDOM: Creatinine, Urine: 24.96 mg/dL

## 2018-12-12 LAB — TSH: TSH: 0.147 u[IU]/mL — ABNORMAL LOW (ref 0.350–4.500)

## 2018-12-12 LAB — T4, FREE: Free T4: 2.1 ng/dL — ABNORMAL HIGH (ref 0.61–1.12)

## 2018-12-12 LAB — BRAIN NATRIURETIC PEPTIDE: B Natriuretic Peptide: 899.9 pg/mL — ABNORMAL HIGH (ref 0.0–100.0)

## 2018-12-12 LAB — SODIUM, URINE, RANDOM: Sodium, Ur: 87 mmol/L

## 2018-12-12 IMAGING — DX PORTABLE CHEST - 1 VIEW
1 series · 1 of 1 positions shown · non-contrast
Comparison: Chest x-rays dated [DATE] and [DATE].

CLINICAL DATA: Rapid and irregular heart rate

EXAM:
PORTABLE CHEST 1 VIEW

[chest ap]
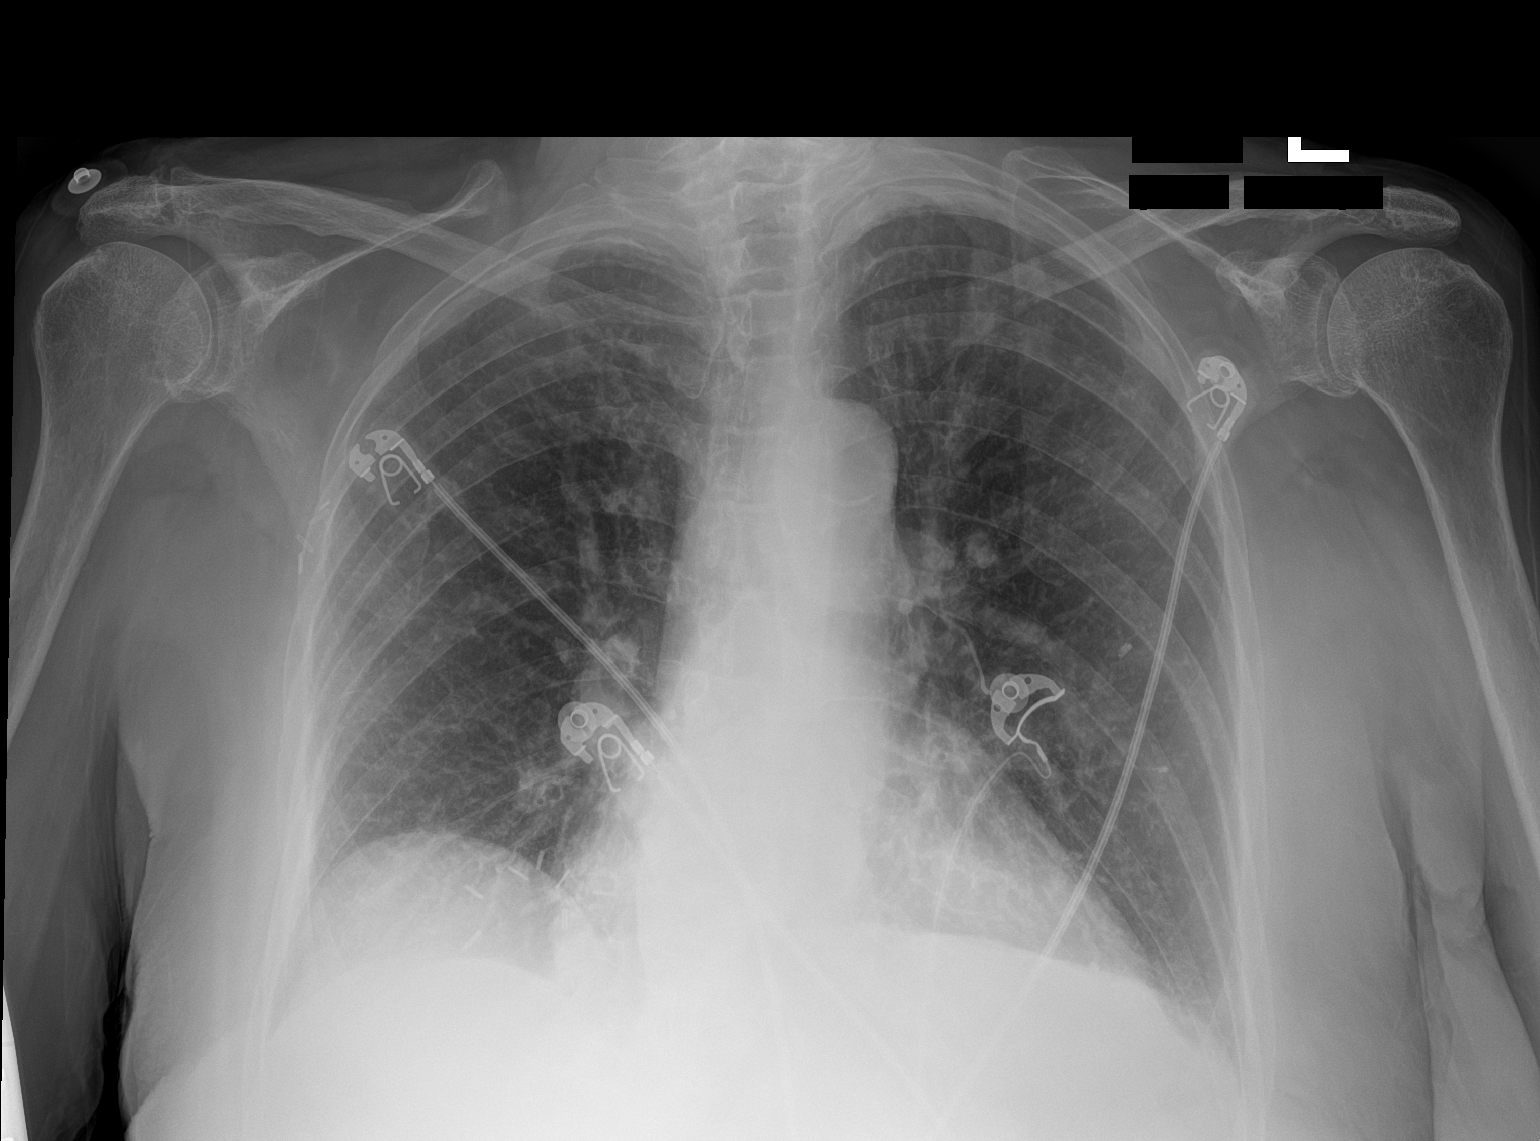

[1 of 1 positions shown; findings below may reference images not displayed]

FINDINGS: Heart size and mediastinal contours are stable. Central
parabronchial thickening/edema bilaterally. Probable cephalization.
No confluent opacity to suggest consolidating pneumonia. No pleural
effusion or pneumothorax seen. Osseous structures about the chest
are unremarkable.
IMPRESSION: Acute bronchitic change. This could represent mild CHF or
atypical/viral pneumonia. No evidence of consolidating pneumonia or
overt alveolar pulmonary edema.

## 2018-12-12 MED ORDER — HEPARIN (PORCINE) 25000 UT/250ML-% IV SOLN
1000.0000 [IU]/h | INTRAVENOUS | Status: DC
  Filled 2018-12-12: qty 250

## 2018-12-12 MED ORDER — DILTIAZEM HCL 25 MG/5ML IV SOLN
15.0000 mg | Freq: Once | INTRAVENOUS | Status: AC
  Administered 2018-12-12: 15 mg via INTRAVENOUS
  Filled 2018-12-12: qty 5

## 2018-12-12 MED ORDER — SODIUM CHLORIDE 0.9% FLUSH
3.0000 mL | Freq: Two times a day (BID) | INTRAVENOUS | Status: DC
  Administered 2018-12-12 – 2018-12-17 (×7): 3 mL via INTRAVENOUS

## 2018-12-12 MED ORDER — DILTIAZEM HCL-DEXTROSE 100-5 MG/100ML-% IV SOLN (PREMIX)
5.0000 mg/h | INTRAVENOUS | Status: DC
  Administered 2018-12-12: 18:00:00 5 mg/h via INTRAVENOUS
  Administered 2018-12-13: 21:00:00 10 mg/h via INTRAVENOUS
  Administered 2018-12-14 (×2): 15 mg/h via INTRAVENOUS
  Administered 2018-12-15: 10 mg/h via INTRAVENOUS
  Administered 2018-12-15: 21:00:00 5 mg/h via INTRAVENOUS
  Filled 2018-12-12 (×8): qty 100

## 2018-12-12 MED ORDER — FUROSEMIDE 10 MG/ML IJ SOLN: 40.0000 mg | Freq: Every day | INTRAMUSCULAR | Status: DC

## 2018-12-12 MED ORDER — PANTOPRAZOLE SODIUM 40 MG PO TBEC
40.0000 mg | DELAYED_RELEASE_TABLET | Freq: Every day | ORAL | Status: DC
  Administered 2018-12-13 – 2018-12-17 (×6): 40 mg via ORAL
  Filled 2018-12-12 (×5): qty 1

## 2018-12-12 MED ORDER — DILTIAZEM HCL-DEXTROSE 100-5 MG/100ML-% IV SOLN (PREMIX)
5.0000 mg/h | INTRAVENOUS | Status: DC
  Filled 2018-12-12: qty 100

## 2018-12-12 MED ORDER — MELATONIN 3 MG PO TABS
6.0000 mg | ORAL_TABLET | Freq: Every day | ORAL | Status: DC
  Administered 2018-12-12 – 2018-12-16 (×5): 6 mg via ORAL
  Filled 2018-12-12 (×6): qty 2

## 2018-12-12 MED ORDER — DILTIAZEM LOAD VIA INFUSION: 10.0000 mg | Freq: Once | INTRAVENOUS | Status: DC

## 2018-12-12 MED ORDER — DILTIAZEM HCL 25 MG/5ML IV SOLN
10.0000 mg | Freq: Once | INTRAVENOUS | Status: AC
  Administered 2018-12-12: 10 mg via INTRAVENOUS
  Filled 2018-12-12: qty 5

## 2018-12-12 MED ORDER — SODIUM CHLORIDE 0.9% FLUSH: 3.0000 mL | INTRAVENOUS | Status: DC | PRN

## 2018-12-12 MED ORDER — FUROSEMIDE 10 MG/ML IJ SOLN
40.0000 mg | INTRAMUSCULAR | Status: AC
  Administered 2018-12-12: 16:00:00 40 mg via INTRAVENOUS
  Filled 2018-12-12: qty 4

## 2018-12-12 MED ORDER — LOPERAMIDE HCL 2 MG PO CAPS: 2.0000 mg | ORAL_CAPSULE | ORAL | Status: DC | PRN

## 2018-12-12 MED ORDER — ONDANSETRON HCL 4 MG/2ML IJ SOLN: 4.0000 mg | Freq: Four times a day (QID) | INTRAMUSCULAR | Status: DC | PRN

## 2018-12-12 MED ORDER — ACETAMINOPHEN 325 MG PO TABS: 650.0000 mg | ORAL_TABLET | ORAL | Status: DC | PRN

## 2018-12-12 MED ORDER — LEVOTHYROXINE SODIUM 112 MCG PO TABS
112.0000 ug | ORAL_TABLET | Freq: Every day | ORAL | Status: DC
  Administered 2018-12-13: 07:00:00 112 ug via ORAL
  Filled 2018-12-12: qty 1

## 2018-12-12 MED ORDER — MOMETASONE FURO-FORMOTEROL FUM 100-5 MCG/ACT IN AERO
2.0000 | INHALATION_SPRAY | Freq: Two times a day (BID) | RESPIRATORY_TRACT | Status: DC
  Administered 2018-12-14 – 2018-12-17 (×6): 2 via RESPIRATORY_TRACT
  Filled 2018-12-12: qty 8.8

## 2018-12-12 MED ORDER — GLUCOSAMINE HCL 1500 MG PO TABS: 3000.0000 mg | ORAL_TABLET | Freq: Every day | ORAL | Status: DC

## 2018-12-12 MED ORDER — SODIUM CHLORIDE 0.9 % IV SOLN: 250.0000 mL | INTRAVENOUS | Status: DC | PRN

## 2018-12-12 MED ORDER — APIXABAN 2.5 MG PO TABS
2.5000 mg | ORAL_TABLET | Freq: Two times a day (BID) | ORAL | Status: DC
  Administered 2018-12-12 – 2018-12-17 (×10): 2.5 mg via ORAL
  Filled 2018-12-12 (×10): qty 1

## 2018-12-12 MED ORDER — APIXABAN 2.5 MG PO TABS
2.5000 mg | ORAL_TABLET | Freq: Two times a day (BID) | ORAL | Status: DC
  Filled 2018-12-12: qty 1

## 2018-12-12 MED ORDER — FAMOTIDINE 40 MG/5ML PO SUSR
20.0000 mg | Freq: Every day | ORAL | Status: DC
  Administered 2018-12-13 – 2018-12-17 (×5): 20 mg via ORAL
  Filled 2018-12-12 (×6): qty 2.5

## 2018-12-12 MED ORDER — HEPARIN BOLUS VIA INFUSION
4000.0000 [IU] | Freq: Once | INTRAVENOUS | Status: DC
  Filled 2018-12-12: qty 4000

## 2018-12-12 MED ORDER — RISAQUAD PO CAPS
1.0000 | ORAL_CAPSULE | Freq: Every day | ORAL | Status: DC
  Administered 2018-12-13 – 2018-12-17 (×5): 1 via ORAL
  Filled 2018-12-12 (×5): qty 1

## 2018-12-12 MED ORDER — PANCRELIPASE (LIP-PROT-AMYL) 12000-38000 UNITS PO CPEP
72000.0000 [IU] | ORAL_CAPSULE | Freq: Three times a day (TID) | ORAL | Status: DC
  Administered 2018-12-13 – 2018-12-17 (×13): 72000 [IU] via ORAL
  Filled 2018-12-12 (×13): qty 6

## 2018-12-12 MED ORDER — FUROSEMIDE 10 MG/ML IJ SOLN
40.0000 mg | Freq: Every day | INTRAMUSCULAR | Status: DC
  Administered 2018-12-13 – 2018-12-15 (×3): 40 mg via INTRAVENOUS
  Filled 2018-12-12 (×3): qty 4

## 2018-12-12 MED ORDER — SODIUM CHLORIDE 0.9% FLUSH
3.0000 mL | Freq: Two times a day (BID) | INTRAVENOUS | Status: DC
  Administered 2018-12-13: 13:00:00 3 mL via INTRAVENOUS

## 2018-12-12 MED ORDER — ACETAMINOPHEN 325 MG PO TABS
650.0000 mg | ORAL_TABLET | ORAL | Status: DC | PRN
  Administered 2018-12-13 – 2018-12-16 (×4): 650 mg via ORAL
  Filled 2018-12-12 (×4): qty 2

## 2018-12-12 MED ORDER — NON FORMULARY: 6.0000 mg | Freq: Every day | Status: DC

## 2018-12-12 NOTE — Progress Notes (Signed)
PHARMACIST - PHYSICIAN ORDER COMMUNICATION  CONCERNING: P&T Medication Policy on Herbal Medications  DESCRIPTION:  This patient's order for:  glucosamine  has been noted.  This product(s) is classified as an "herbal" or natural product. Due to a lack of definitive safety studies or FDA approval, nonstandard manufacturing practices, plus the potential risk of unknown drug-drug interactions while on inpatient medications, the Pharmacy and Therapeutics Committee does not permit the use of "herbal" or natural products of this type within [facility].   ACTION TAKEN: The pharmacy department is unable to verify this order at this time and your patient has been informed of this safety policy. Please reevaluate patient's clinical condition at discharge and address if the herbal or natural product(s) should be resumed at that time.  Berenice Bouton, PharmD PGY1 Pharmacy Resident Office phone: [phone]

## 2018-12-12 NOTE — ED Notes (Signed)
Pt talking to daughter now.

## 2018-12-12 NOTE — H&P (Signed)
History and Physical    Cassandra Ray ZOX:[id number] DOB: Mar 01, 1923 DOA: 12/12/2018  PCP: Lawerance Cruel, MD  Patient coming from: Independent living at [facility]  I have personally briefly reviewed patient's old medical records in [place]  Chief Complaint: Weakness/fatigue.  HPI: Cassandra Ray is a 85 y.o. female with medical history significant of ??  History of A. fib in 2015, hypertension, hypothyroidism, chronic diastolic heart failure, pulmonary hypertension, gastroesophageal reflux disease, history of right breast cancer status post lumpectomy presented to the ED from independent living facility 2-day history of generalized weakness and lethargy per patient.  Patient denies any fevers, no chills, no nausea, no vomiting, no chest pain, no emesis, no diarrhea, no constipation, no abdominal pain, no syncope, no lightheadedness, no palpitations, no melena, no hematemesis, no hematochezia.  Patient does endorse some intermittent shortness of breath.  Patient states her husband called the nurse at the facility who came and assessed her patient noted to be in A. fib with RVR and as such EMS was called and patient brought to the ED.  Patient does endorse compliance with medications and diet.  ED Course: Patient seen in the ED noted to be in A. fib with RVR.  Patient given some Cardizem pushes with some improvement with heart rate however heart rate continue to fluctuate as high as the 130s.  Patient subsequently started on a Cardizem drip.  Comprehensive metabolic profile obtained with a BUN of 46, creatinine of 2.26, albumin of 3.1, protein of 6.4 otherwise was within normal limits.  BNP elevated at 899.9.  High-sensitivity troponin at 67 and second set at 60.  CBC with a white count of 13 otherwise was within normal limits.  TSH noted to be at 0.147.  Chest x-ray done with some acute bronchitic change which could represent mild CHF or atypical/viral pneumonia.  No evidence of  consolidating pneumonia or overt alveolar pulmonary edema.  EKG done showing A. fib with RVR with a rate of 136.  Cardiology consulted.  Review of Systems: As per HPI otherwise 10 point review of systems negative.   Past Medical History:  Diagnosis Date   Actinic keratoses    Arthritis    back    Atrial fibrillation ([place])    a. questionable episode in 2015 in [facility]. b. event monitor through December/January 2014/2015 which showed no evidence of atrial fibrillation.   Breast cancer ([place])    Breast cancer (right)   Chronic diastolic CHF (congestive heart failure) (HCC)    Compression fracture of L3 lumbar vertebra    Dysphagia    GERD (gastroesophageal reflux disease)    takes otc Pepcid as needed   Hiatal hernia 11/27/2014   HTN (hypertension)    Hypothyroidism    Insomnia    Osteopenia    Pleural effusion     Past Surgical History:  Procedure Laterality Date   BREAST LUMPECTOMY Right 1995   lumpectomy   COLONOSCOPY     EYE SURGERY Bilateral    cataract w/ lens implant   KYPHOPLASTY N/A 09/18/2013   Procedure: KYPHOPLASTY;  Surgeon: Sinclair Ship, MD;  Location: [place];  Service: Orthopedics;  Laterality: N/A;  Lumbar 3 kyphoplasty   TONSILLECTOMY       reports that she has quit smoking. She has never used smokeless tobacco. She reports current alcohol use of about 7.0 standard drinks of alcohol per week. She reports that she does not use drugs.  Allergies  Allergen Reactions  Tape Other (See Comments)    Patient prefers easy-release tape    Family History  Problem Relation Age of Onset   CAD Mother    Diabetes Mellitus II Mother    Heart failure Mother    Heart failure Father    Prostate cancer Brother    Kidney cancer Brother    Heart disease Sister    Esophageal cancer Neg Hx    Colon cancer Neg Hx    Mother deceased age 77 from coronary artery disease.  Father deceased age 31 from CHF.  Prior to Admission medications    Medication Sig Start Date End Date Taking? Authorizing Provider  acetaminophen (TYLENOL) 500 MG tablet Take 500-1,000 mg by mouth every 8 (eight) hours as needed for mild pain or headache.   Yes [provider]  B Complex-C (B-COMPLEX WITH VITAMIN C) tablet Take 1 tablet by mouth daily.   Yes [provider]  Calcium-Phosphorus-Vitamin D (CITRACAL +D3 PO) Take 1 tablet by mouth daily.   Yes [provider]  Cholecalciferol (VITAMIN D3) 2000 units TABS Take 2,000 Units by mouth daily.    Yes [provider]  furosemide (LASIX) 40 MG tablet Take 1 tablet (40 mg total) by mouth daily. 05/26/18  Yes Hosie Poisson, MD  Glucosamine HCl 1500 MG TABS Take 3,000 mg by mouth at bedtime.    Yes [provider]  levothyroxine (SYNTHROID, LEVOTHROID) 112 MCG tablet Take 112 mcg by mouth daily before breakfast.   Yes [provider]  lipase/protease/amylase (CREON) 36000 UNITS CPEP capsule Take 72,000 Units by mouth 3 (three) times daily.    Yes [provider]  loperamide (IMODIUM A-D) 2 MG tablet Take 2 mg by mouth as needed for diarrhea or loose stools.   Yes [provider]  losartan (COZAAR) 50 MG tablet Take 50 mg by mouth daily.   Yes [provider]  metoprolol tartrate (LOPRESSOR) 50 MG tablet Take 1 tablet (50 mg total) by mouth 2 (two) times daily. 05/25/18  Yes Hosie Poisson, MD  nitroGLYCERIN (NITROSTAT) 0.4 MG SL tablet Place 0.4 mg under the tongue every 5 (five) minutes as needed for chest pain.   Yes [provider]  pantoprazole (PROTONIX) 40 MG tablet Take 40 mg by mouth daily.   Yes [provider]  Probiotic Product (ALIGN) 4 MG CAPS Take 4 mg by mouth daily.   Yes [provider]  SYMBICORT 80-4.5 MCG/ACT inhaler Take 2 puffs by mouth every morning.  12/09/14  Yes [provider]    Physical Exam: Vitals:   12/12/18 1630 12/12/18 1645 12/12/18 1700 12/12/18 1800  BP:  108/70 113/68 (!) 120/104 (!) 116/103  Pulse: 79 69 (!) 104   Resp: (!) 21 (!) 30 (!) 23 (!) 22  SpO2: 97% 97% 98%   Weight:      Height:        Constitutional: NAD, calm, comfortable Vitals:   12/12/18 1630 12/12/18 1645 12/12/18 1700 12/12/18 1800  BP: 108/70 113/68 (!) 120/104 (!) 116/103  Pulse: 79 69 (!) 104   Resp: (!) 21 (!) 30 (!) 23 (!) 22  SpO2: 97% 97% 98%   Weight:      Height:       Eyes: PERRL, lids and conjunctivae normal ENMT: Mucous membranes are dry. Posterior pharynx clear of any exudate or lesions.Normal dentition.  Neck: normal, supple, no masses, no thyromegaly Respiratory: Some bibasilar crackles, scattered crackles, minimal to mild expiratory wheezing, no rhonchi, no  use of accessory muscles of respiration.  Speaking in full sentences.   Cardiovascular: Irregularly irregular.  Trace to 1+ bilateral lower extremity edema.  Abdomen: no tenderness, no masses palpated. No hepatosplenomegaly. Bowel sounds positive.  Musculoskeletal: no clubbing / cyanosis. No joint deformity upper and lower extremities. Good ROM, no contractures. Normal muscle tone.  Skin: no rashes, lesions, ulcers. No induration Neurologic: CN 2-12 grossly intact. Sensation intact, DTR normal. Strength 5/5 in all 4.  Psychiatric: Normal judgment and insight. Alert and oriented x 3. Normal mood.   Labs on Admission: I have personally reviewed following labs and imaging studies  CBC: Recent Labs  Lab 12/12/18 1225  WBC 13.0*  NEUTROABS 8.6*  HGB 13.0  HCT 38.6  MCV 94.1  PLT 465   Basic Metabolic Panel: Recent Labs  Lab 12/12/18 1225  NA 136  K 4.0  CL 101  CO2 22  GLUCOSE 98  BUN 46*  CREATININE 2.26*  CALCIUM 9.1   GFR: Estimated Creatinine Clearance: 15.8 mL/min (A) (by C-G formula based on SCr of 2.26 mg/dL (H)). Liver Function Tests: Recent Labs  Lab 12/12/18 1225  AST 31  ALT 21  ALKPHOS 74  BILITOT 1.1  PROT 6.4*  ALBUMIN 3.1*   No results for input(s):  LIPASE, AMYLASE in the last 168 hours. No results for input(s): AMMONIA in the last 168 hours. Coagulation Profile: No results for input(s): INR, PROTIME in the last 168 hours. Cardiac Enzymes: No results for input(s): CKTOTAL, CKMB, CKMBINDEX, TROPONINI in the last 168 hours. BNP (last 3 results) No results for input(s): PROBNP in the last 8760 hours. HbA1C: No results for input(s): HGBA1C in the last 72 hours. CBG: No results for input(s): GLUCAP in the last 168 hours. Lipid Profile: No results for input(s): CHOL, HDL, LDLCALC, TRIG, CHOLHDL, LDLDIRECT in the last 72 hours. Thyroid Function Tests: Recent Labs    12/12/18 1330 12/12/18 1653  TSH 0.147*  --   FREET4  --  2.10*   Anemia Panel: No results for input(s): VITAMINB12, FOLATE, FERRITIN, TIBC, IRON, RETICCTPCT in the last 72 hours. Urine analysis:    Component Value Date/Time   COLORURINE YELLOW 04/24/2014 2257   APPEARANCEUR CLEAR 04/24/2014 2257   LABSPEC 1.009 04/24/2014 2257   PHURINE 7.5 04/24/2014 2257   GLUCOSEU NEGATIVE 04/24/2014 2257   HGBUR NEGATIVE 04/24/2014 2257   BILIRUBINUR NEGATIVE 04/24/2014 2257   KETONESUR NEGATIVE 04/24/2014 2257   PROTEINUR NEGATIVE 04/24/2014 2257   UROBILINOGEN 0.2 04/24/2014 2257   NITRITE NEGATIVE 04/24/2014 2257   LEUKOCYTESUR NEGATIVE 04/24/2014 2257    Radiological Exams on Admission: Dg Chest Port 1 View  Result Date: 12/12/2018 CLINICAL DATA:  Rapid and irregular heart rate EXAM: PORTABLE CHEST 1 VIEW COMPARISON:  Chest x-rays dated 05/23/2018 and 02/01/2016. FINDINGS: Heart size and mediastinal contours are stable. Central parabronchial thickening/edema bilaterally. Probable cephalization. No confluent opacity to suggest consolidating pneumonia. No pleural effusion or pneumothorax seen. Osseous structures about the chest are unremarkable. IMPRESSION: Acute bronchitic change. This could represent mild CHF or atypical/viral pneumonia. No evidence of consolidating  pneumonia or overt alveolar pulmonary edema. Electronically Signed   By: Franki Cabot M.D.   On: 12/12/2018 13:50    EKG: Independently reviewed.  A. fib with RVR heart rate 136  Assessment/Plan Principal Problem:   New onset a-fib (HCC) Active Problems:   Acute on chronic diastolic CHF (congestive heart failure) (HCC)   HTN (hypertension)   Hypothyroidism   Pleural effusion, right   Acute  kidney injury superimposed on chronic kidney disease ([place])   Leukocytosis   1 new onset atrial fibrillation Patient noted in the past have a questionable history of A. fib however does not hold a diagnosis of A. fib. CHA2DS2VASC score > 4.  Questionable etiology.  Patient denies any chest pain.  Patient with concerns for acute on chronic diastolic CHF.  Cardiac enzymes elevated but plateaued and likely had demand ischemia.  Patient presented to the ED with generalized weakness and lethargy noted to be in new onset A. fib with RVR with heart rates in the 130s.  Patient received 2 pushes of IV Cardizem with some initial improvement with heart rate however heart rate noted to be back in the 130s and fluctuating and as such patient started on a Cardizem drip.  Check a 2D echo.  Continue Cardizem drip.  Patient seen in consultation by cardiology who are recommending anticoagulation and patient has been started on Eliquis.  Cardiology following and appreciate input and recommendations.  2.  Acute on chronic diastolic heart failure Likely secondary to problem #1.  Patient noted to have an elevated BNP of 899.9.  Patient with some complaints of some intermittent shortness of breath.  Physical exam with some bibasilar and scattered crackles noted.  Chest x-ray concerning for possible CHF versus atypical/viral pneumonia.  Patient afebrile.  Patient given a dose of Lasix 40 mg IV x1 in the ED.  Cardiac enzymes elevated but plateaued likely demand ischemia.  Place patient on Lasix 40 mg IV daily.  Strict I's and O's.   Daily weights.  We will hold beta-blocker for now.  Cardiology following and appreciate input and recommendations.  3.  Hypothyroidism TSH at 0.147.  Check a free T4.  May need to decrease home dose Synthroid.  Follow for now.  Will likely need repeat thyroid function studies done in about 4 to 6 weeks.  4.  Acute kidney injury on chronic kidney disease stage III Patient with a creatinine of 2.26 on admission last creatinine was 1.49 on May 25, 2018.  Likely prerenal azotemia secondary to acute CHF exacerbation, in the setting of ARB.  Will hold ARB.  Check a UA with cultures and sensitivities.  Check urine sodium, urine creatinine.  Patient on Lasix 40 mg IV daily.  Follow renal function.  If worsening renal function will check a renal ultrasound.  Follow for now.  5.  Leukocytosis Likely reactive leukocytosis.  Patient afebrile.  Chest x-ray with no acute infiltrate noted.  Check a UA with cultures and sensitivities.  No need for antibiotics at this time.  Follow.  6.  Gastroesophageal reflux disease Place on Pepcid.  7.  Weakness/fatigue Likely secondary to problems #1 and 2.  PT/OT.  DVT prophylaxis: Eliquis Code Status: DNR Family Communication: Updated patient.  No family at bedside. Disposition Plan: Likely back to independent living when clinically improved. Consults called: Cardiology: Dr. [name] Admission status: Place in observation.  Stepdown unit.   Irine Seal MD [facility]  If 7PM-7AM, please contact night-coverage [email or web address]  12/12/2018, 6:25 PM

## 2018-12-12 NOTE — ED Notes (Signed)
Got patient on the monitor did ekg shown to Dr Rex Kras patient is resting with call bell in reach

## 2018-12-12 NOTE — ED Notes (Signed)
Pt talking to family on the phone

## 2018-12-12 NOTE — ED Triage Notes (Signed)
Pt arrives via ems from [facility] with co rapid and irregular hr. Pt states she hasnt felt like her self the last 2 days. Hx. CHF

## 2018-12-12 NOTE — ED Notes (Signed)
ED TO INPATIENT HANDOFF REPORT  ED Nurse Name and Phone #: Nicol Herbig  S Name/Age/Gender Cassandra Ray 85 y.o. female Room/Bed: 039C/039C  Code Status   Code Status: Prior  Home/SNF/Other Nursing Home Patient oriented to: self, place, time and situation Is this baseline? Yes   Triage Complete: Triage complete  Chief Complaint AFIB  Triage Note Pt arrives via em[facility] with co rapid and irregular hr. Pt states she hasnt felt like her self the last 2 days. Hx. CHF    Allergies Allergies  Allergen Reactions  . Tape Other (See Comments)    Patient prefers easy-release tape    Level of Care/Admitting Diagnosis ED Disposition    ED Disposition Condition [place]: [place] [100100]  Level of Care: Progressive [102]  I expect the patient will be discharged within 24 hours: No (not a candidate for 5C-Observation unit)  Covid Evaluation: Confirmed COVID Negative  Diagnosis: New onset a-fib [facility] [[id number]]  Admitting Physician: Eugenie Filler [[id number]]  Attending Physician: Eugenie Filler [[id number]]  PT Class (Do Not Modify): Observation [104]  PT Acc Code (Do Not Modify): Observation [10022]       B Medical/Surgery History Past Medical History:  Diagnosis Date  . Actinic keratoses   . Arthritis    back   . Atrial fibrillation ([place])    a. questionable episode in 2015 in [facility]. b. event monitor through December/January 2014/2015 which showed no evidence of atrial fibrillation.  . Breast cancer ([place])    Breast cancer (right)  . Chronic diastolic CHF (congestive heart failure) ([place])   . Compression fracture of L3 lumbar vertebra   . Dysphagia   . GERD (gastroesophageal reflux disease)    takes otc Pepcid as needed  . Hiatal hernia 11/27/2014  . HTN (hypertension)   . Hypothyroidism   . Insomnia   . Osteopenia   . Pleural effusion    Past Surgical History:  Procedure Laterality Date  . BREAST  LUMPECTOMY Right 1995   lumpectomy  . COLONOSCOPY    . EYE SURGERY Bilateral    cataract w/ lens implant  . KYPHOPLASTY N/A 09/18/2013   Procedure: KYPHOPLASTY;  Surgeon: Sinclair Ship, MD;  Location: [facility];  Service: Orthopedics;  Laterality: N/A;  Lumbar 3 kyphoplasty  . TONSILLECTOMY       A IV Location/Drains/Wounds Patient Lines/Drains/Airways Status   Active Line/Drains/Airways    Name:   Placement date:   Placement time:   Site:   Days:   Peripheral IV 12/12/18 Left Antecubital   12/12/18    1328    Antecubital   less than 1          Intake/Output Last 24 hours No intake or output data in the 24 hours ending 12/12/18 1740  Labs/Imaging Results for orders placed or performed during the hospital encounter of 12/12/18 (from the past 48 hour(s))  Comprehensive metabolic panel     Status: Abnormal   Collection Time: 12/12/18 12:25 PM  Result Value Ref Range   Sodium 136 135 - 145 mmol/L   Potassium 4.0 3.5 - 5.1 mmol/L   Chloride 101 98 - 111 mmol/L   CO2 22 22 - 32 mmol/L   Glucose, Bld 98 70 - 99 mg/dL   BUN 46 (H) 8 - 23 mg/dL   Creatinine, Ser 2.26 (H) 0.44 - 1.00 mg/dL   Calcium 9.1 8.9 - 10.3 mg/dL   Total Protein 6.4 (  L) 6.5 - 8.1 g/dL   Albumin 3.1 (L) 3.5 - 5.0 g/dL   AST 31 15 - 41 U/L   ALT 21 0 - 44 U/L   Alkaline Phosphatase 74 38 - 126 U/L   Total Bilirubin 1.1 0.3 - 1.2 mg/dL   GFR calc non Af Amer 18 (L) >60 mL/min   GFR calc Af Amer 21 (L) >60 mL/min   Anion gap 13 5 - 15    Comment: Performed at [facility] [address] [place], [place] [address]  Brain natriuretic peptide     Status: Abnormal   Collection Time: 12/12/18 12:25 PM  Result Value Ref Range   B Natriuretic Peptide 899.9 (H) 0.0 - 100.0 pg/mL    Comment: Performed at [facility] [address] [place], [place] [address]  Troponin I (High Sensitivity)     Status: Abnormal   Collection Time: 12/12/18 12:25 PM  Result Value Ref Range   Troponin I (High  Sensitivity) 67 (H) <18 ng/L    Comment: (NOTE) Elevated high sensitivity troponin I (hsTnI) values and significant  changes across serial measurements may suggest ACS but many other  chronic and acute conditions are known to elevate hsTnI results.  Refer to the "Links" section for chest pain algorithms and additional  guidance. Performed at [facility], [place] [address] [place], [place] [address]   CBC with Differential     Status: Abnormal   Collection Time: 12/12/18 12:25 PM  Result Value Ref Range   WBC 13.0 (H) 4.0 - 10.5 K/uL   RBC 4.10 3.87 - 5.11 MIL/uL   Hemoglobin 13.0 12.0 - 15.0 g/dL   HCT 38.6 36.0 - 46.0 %   MCV 94.1 80.0 - 100.0 fL   MCH 31.7 26.0 - 34.0 pg   MCHC 33.7 30.0 - 36.0 g/dL   RDW 13.2 11.5 - 15.5 %   Platelets 175 150 - 400 K/uL   nRBC 0.0 0.0 - 0.2 %   Neutrophils Relative % 66 %   Neutro Abs 8.6 (H) 1.7 - 7.7 K/uL   Lymphocytes Relative 16 %   Lymphs Abs 2.0 0.7 - 4.0 K/uL   Monocytes Relative 15 %   Monocytes Absolute 2.0 (H) 0.1 - 1.0 K/uL   Eosinophils Relative 2 %   Eosinophils Absolute 0.3 0.0 - 0.5 K/uL   Basophils Relative 0 %   Basophils Absolute 0.0 0.0 - 0.1 K/uL   Immature Granulocytes 1 %   Abs Immature Granulocytes 0.06 0.00 - 0.07 K/uL    Comment: Performed at [facility] [address] [place], [place] [address]  TSH     Status: Abnormal   Collection Time: 12/12/18  1:30 PM  Result Value Ref Range   TSH 0.147 (L) 0.350 - 4.500 uIU/mL    Comment: Performed by a 3rd Generation assay with a functional sensitivity of <=0.01 uIU/mL. Performed at [facility], [place] [address], [place], [place] [address]   SARS Coronavirus 2 (CEPHEID - Performed in [facility] hospital lab), Hosp Order     Status: None   Collection Time: 12/12/18  1:49 PM   Specimen: Nasopharyngeal Swab  Result Value Ref Range   SARS Coronavirus 2 NEGATIVE NEGATIVE    Comment: (NOTE) If result is NEGATIVE SARS-CoV-2 target nucleic acids are NOT  DETECTED. The SARS-CoV-2 RNA is generally detectable in upper and lower  respiratory specimens during the acute phase of infection. The lowest  concentration of SARS-CoV-2 viral copies this assay can detect is 250  copies / mL. A negative result does not preclude SARS-CoV-2 infection  and should not be used as the sole basis for treatment or other  patient management decisions.  A negative result may occur with  improper specimen collection / handling, submission of specimen other  than nasopharyngeal swab, presence of viral mutation(s) within the  areas targeted by this assay, and inadequate number of viral copies  (<250 copies / mL). A negative result must be combined with clinical  observations, patient history, and epidemiological information. If result is POSITIVE SARS-CoV-2 target nucleic acids are DETECTED. The SARS-CoV-2 RNA is generally detectable in upper and lower  respiratory specimens dur ing the acute phase of infection.  Positive  results are indicative of active infection with SARS-CoV-2.  Clinical  correlation with patient history and other diagnostic information is  necessary to determine patient infection status.  Positive results do  not rule out bacterial infection or co-infection with other viruses. If result is PRESUMPTIVE POSTIVE SARS-CoV-2 nucleic acids MAY BE PRESENT.   A presumptive positive result was obtained on the submitted specimen  and confirmed on repeat testing.  While 2019 novel coronavirus  (SARS-CoV-2) nucleic acids may be present in the submitted sample  additional confirmatory testing may be necessary for epidemiological  and / or clinical management purposes  to differentiate between  SARS-CoV-2 and other Sarbecovirus currently known to infect humans.  If clinically indicated additional testing with an alternate test  methodology [phone]) is advised. The SARS-CoV-2 RNA is generally  detectable in upper and lower respiratory sp ecimens during  the acute  phase of infection. The expected result is Negative. Fact Sheet for Patients:  StrictlyIdeas.no Fact Sheet for Healthcare Providers: BankingDealers.co.za This test is not yet approved or cleared by the [place] FDA and has been authorized for detection and/or diagnosis of SARS-CoV-2 by FDA under an Emergency Use Authorization (EUA).  This EUA will remain in effect (meaning this test can be used) for the duration of the COVID-19 declaration under Section 564(b)(1) of the Act, 21 U.S.C. section 360bbb-3(b)(1), unless the authorization is terminated or revoked sooner. Performed at [facility], [place] [address], [place], [place] [address]   Troponin I (High Sensitivity)     Status: Abnormal   Collection Time: 12/12/18  4:10 PM  Result Value Ref Range   Troponin I (High Sensitivity) 60 (H) <18 ng/L    Comment: (NOTE) Elevated high sensitivity troponin I (hsTnI) values and significant  changes across serial measurements may suggest ACS but many other  chronic and acute conditions are known to elevate hsTnI results.  Refer to the "Links" section for chest pain algorithms and additional  guidance. Performed at [facility], [place] [address], [place], [place] [address]    Dg Chest Port 1 View  Result Date: 12/12/2018 CLINICAL DATA:  Rapid and irregular heart rate EXAM: PORTABLE CHEST 1 VIEW COMPARISON:  Chest x-rays dated 05/23/2018 and 02/01/2016. FINDINGS: Heart size and mediastinal contours are stable. Central parabronchial thickening/edema bilaterally. Probable cephalization. No confluent opacity to suggest consolidating pneumonia. No pleural effusion or pneumothorax seen. Osseous structures about the chest are unremarkable. IMPRESSION: Acute bronchitic change. This could represent mild CHF or atypical/viral pneumonia. No evidence of consolidating pneumonia or overt alveolar pulmonary edema. Electronically Signed   By:  Franki Cabot M.D.   On: 12/12/2018 13:50    Pending Labs Unresulted Labs (From admission, onward)  Start     Ordered   12/13/18 0500  CBC  Daily,   R     12/12/18 1600   12/13/18 0786  Basic metabolic panel  Tomorrow morning,   R     12/12/18 1653   12/12/18 1701  Sodium, urine, random  Once,   STAT     12/12/18 1700   12/12/18 1701  Creatinine, urine, random  Once,   STAT     12/12/18 1700   12/12/18 1657  Urinalysis, Routine w reflex microscopic  Once,   STAT     12/12/18 1700   12/12/18 1657  Urine Culture  Once,   STAT     12/12/18 1700   12/12/18 1653  T3, free  Once,   STAT     12/12/18 1653   12/12/18 1653  T4, free  Once,   STAT     12/12/18 1653          Vitals/Pain Today's Vitals   12/12/18 1615 12/12/18 1630 12/12/18 1645 12/12/18 1700  BP: 102/68 108/70 113/68 (!) 120/104  Pulse: (!) 45 79 69 (!) 104  Resp: 15 (!) 21 (!) 30 (!) 23  SpO2: 98% 97% 97% 98%  Weight:      Height:      PainSc:        Isolation Precautions No active isolations  Medications Medications  diltiazem (CARDIZEM) 100 mg in dextrose 5% 157mL (1 mg/mL) infusion (5 mg/hr Intravenous New Bag/Given 12/12/18 1739)  apixaban (ELIQUIS) tablet 2.5 mg (has no administration in time range)  diltiazem (CARDIZEM) injection 10 mg (10 mg Intravenous Given 12/12/18 1354)  diltiazem (CARDIZEM) injection 15 mg (15 mg Intravenous Given 12/12/18 1509)  furosemide (LASIX) injection 40 mg (40 mg Intravenous Given 12/12/18 1603)    Mobility manual wheelchair Moderate fall risk   Focused Assessments Cardiac Assessment Handoff:  Cardiac Rhythm: Atrial fibrillation Lab Results  Component Value Date   TROPONINI 0.08 (HH) 05/24/2018   Lab Results  Component Value Date   DDIMER 2.62 (H) 05/23/2018   Does the Patient currently have chest pain? No     R Recommendations: See Admitting Provider Note  Report given to:   Additional Notes:.

## 2018-12-12 NOTE — Progress Notes (Signed)
Received to 8E42 via stretcher from ED. Cardizem gtt at 10 mg/hr via PIV. Cardiac Monitor verified x 2 with CCMD. Afib with RVR 120's on monitor. Oriented to room. Call bell in reach.

## 2018-12-12 NOTE — ED Notes (Signed)
Placed a external cath on patient

## 2018-12-12 NOTE — ED Provider Notes (Signed)
[facility] EMERGENCY DEPARTMENT Provider Note   CSN: [id number] Arrival date & time: 12/12/18  1158    History   Chief Complaint Chief Complaint  Patient presents with  . Atrial Fibrillation    HPI BELEM HINTZE is a 85 y.o. female.     85yo F w/ PMH including CHF, HTN, hypothyroidism, breast CA, A fib not on anticoagulation who p/w fatigue. PT reports 2 days of generalized weakness and fatigue. No heart racing, lightheadedness or chest pain. Her husband thought she looked short of breath/wheezy but she denies feeling SOB. She was noted to be in A fib with RVR and was transferred by EMS from [facility] to ED. She denies vomiting, diarrhea, urinary symptoms, fevers, or cough.  The history is provided by the patient.  Atrial Fibrillation    Past Medical History:  Diagnosis Date  . Actinic keratoses   . Arthritis    back   . Atrial fibrillation ([place])    a. questionable episode in 2015 in [facility]. b. event monitor through December/January 2014/2015 which showed no evidence of atrial fibrillation.  . Breast cancer ([place])    Breast cancer (right)  . Chronic diastolic CHF (congestive heart failure) ([place])   . Compression fracture of L3 lumbar vertebra   . Dysphagia   . GERD (gastroesophageal reflux disease)    takes otc Pepcid as needed  . Hiatal hernia 11/27/2014  . HTN (hypertension)   . Hypothyroidism   . Insomnia   . Osteopenia   . Pleural effusion     Patient Active Problem List   Diagnosis Date Noted  . Tachycardia 05/24/2018  . Acute respiratory failure with hypoxia ([place]) 05/23/2018  . Bilateral impacted cerumen 11/22/2016  . Presbycusis of both ears 11/22/2016  . Pleural effusion, right 02/08/2016  . Acute on chronic diastolic CHF (congestive heart failure) ([place]) 02/08/2016  . Lower extremity edema 01/31/2016  . Chronic diastolic heart failure ([place]) 02/03/2015  . Hyponatremia 04/25/2014  . Shortness of breath   . Hypertensive urgency  04/24/2014  . Compression fracture 09/18/2013  . Knee pain 08/11/2013  . A-fib ([place]) 05/09/2013  . Back pain 05/09/2013  . HTN (hypertension)   . Hypothyroidism     Past Surgical History:  Procedure Laterality Date  . BREAST LUMPECTOMY Right 1995   lumpectomy  . COLONOSCOPY    . EYE SURGERY Bilateral    cataract w/ lens implant  . KYPHOPLASTY N/A 09/18/2013   Procedure: KYPHOPLASTY;  Surgeon: Sinclair Ship, MD;  Location: [facility];  Service: Orthopedics;  Laterality: N/A;  Lumbar 3 kyphoplasty  . TONSILLECTOMY       OB History   No obstetric history on file.      Home Medications    Prior to Admission medications   Medication Sig Start Date End Date Taking? Authorizing Provider  acetaminophen (TYLENOL) 500 MG tablet Take 500-1,000 mg by mouth every 8 (eight) hours as needed for mild pain or headache.   Yes [provider]  B Complex-C (B-COMPLEX WITH VITAMIN C) tablet Take 1 tablet by mouth daily.   Yes [provider]  Calcium-Phosphorus-Vitamin D (CITRACAL +D3 PO) Take 1 tablet by mouth daily.   Yes [provider]  Cholecalciferol (VITAMIN D3) 2000 units TABS Take 2,000 Units by mouth daily.    Yes [provider]  furosemide (LASIX) 40 MG tablet Take 1 tablet (40 mg total) by mouth daily. 05/26/18  Yes Hosie Poisson, MD  Glucosamine HCl 1500 MG  TABS Take 3,000 mg by mouth at bedtime.    Yes [provider]  levothyroxine (SYNTHROID, LEVOTHROID) 112 MCG tablet Take 112 mcg by mouth daily before breakfast.   Yes [provider]  lipase/protease/amylase (CREON) 36000 UNITS CPEP capsule Take 72,000 Units by mouth 3 (three) times daily.    Yes [provider]  loperamide (IMODIUM A-D) 2 MG tablet Take 2 mg by mouth as needed for diarrhea or loose stools.   Yes [provider]  losartan (COZAAR) 50 MG tablet Take 50 mg by mouth daily.   Yes [provider]  metoprolol tartrate (LOPRESSOR) 50 MG  tablet Take 1 tablet (50 mg total) by mouth 2 (two) times daily. 05/25/18  Yes Hosie Poisson, MD  nitroGLYCERIN (NITROSTAT) 0.4 MG SL tablet Place 0.4 mg under the tongue every 5 (five) minutes as needed for chest pain.   Yes [provider]  pantoprazole (PROTONIX) 40 MG tablet Take 40 mg by mouth daily.   Yes [provider]  Probiotic Product (ALIGN) 4 MG CAPS Take 4 mg by mouth daily.   Yes [provider]  SYMBICORT 80-4.5 MCG/ACT inhaler Take 2 puffs by mouth every morning.  12/09/14  Yes [provider]    Family History Family History  Problem Relation Age of Onset  . CAD Mother   . Diabetes Mellitus II Mother   . Heart failure Mother   . Heart failure Father   . Prostate cancer Brother   . Kidney cancer Brother   . Heart disease Sister   . Esophageal cancer Neg Hx   . Colon cancer Neg Hx     Social History Social History   Tobacco Use  . Smoking status: Former Research scientist (life sciences)  . Smokeless tobacco: Never Used  . Tobacco comment: only in college  Substance Use Topics  . Alcohol use: Yes    Alcohol/week: 7.0 standard drinks    Types: 7 Glasses of wine per week    Comment: 1 glass of wine a day  . Drug use: No     Allergies   Tape   Review of Systems Review of Systems All other systems reviewed and are negative except that which was mentioned in HPI   Physical Exam Updated Vital Signs BP 110/75   Pulse (!) 120   Resp (!) 24   Ht 5\' 4"  (1.626 m)   Wt 78.8 kg   LMP  (LMP Unknown)   SpO2 98%   BMI 29.83 kg/m   Physical Exam Vitals signs and nursing note reviewed.  Constitutional:      General: She is not in acute distress.    Appearance: She is well-developed.  HENT:     Head: Normocephalic and atraumatic.  Eyes:     Conjunctiva/sclera: Conjunctivae normal.  Neck:     Musculoskeletal: Neck supple.  Cardiovascular:     Rate and Rhythm: Tachycardia present. Rhythm irregularly irregular.     Heart sounds: Normal heart  sounds. No murmur.  Pulmonary:     Effort: Pulmonary effort is normal.     Breath sounds: Normal breath sounds.  Abdominal:     General: Bowel sounds are normal. There is no distension.     Palpations: Abdomen is soft.     Tenderness: There is no abdominal tenderness.  Musculoskeletal:     Comments: Mild BLE edema  Skin:    General: Skin is warm and dry.  Neurological:     Mental Status: She is alert and oriented  to person, place, and time.     Comments: Fluent speech; hard of hearing  Psychiatric:        Judgment: Judgment normal.      ED Treatments / Results  Labs (all labs ordered are listed, but only abnormal results are displayed) Labs Reviewed  COMPREHENSIVE METABOLIC PANEL - Abnormal; Notable for the following components:      Result Value   BUN 46 (*)    Creatinine, Ser 2.26 (*)    Total Protein 6.4 (*)    Albumin 3.1 (*)    GFR calc non Af Amer 18 (*)    GFR calc Af Amer 21 (*)    All other components within normal limits  BRAIN NATRIURETIC PEPTIDE - Abnormal; Notable for the following components:   B Natriuretic Peptide 899.9 (*)    All other components within normal limits  TROPONIN I (HIGH SENSITIVITY) - Abnormal; Notable for the following components:   Troponin I (High Sensitivity) 67 (*)    All other components within normal limits  CBC WITH DIFFERENTIAL/PLATELET - Abnormal; Notable for the following components:   WBC 13.0 (*)    Neutro Abs 8.6 (*)    Monocytes Absolute 2.0 (*)    All other components within normal limits  TSH - Abnormal; Notable for the following components:   TSH 0.147 (*)    All other components within normal limits  SARS CORONAVIRUS 2 (HOSPITAL ORDER, [facility])  TROPONIN I (HIGH SENSITIVITY)  HEPARIN LEVEL (UNFRACTIONATED)  T3, FREE  T4, FREE    EKG EKG Interpretation  Date/Time:  Thursday December 12 2018 12:06:11 EDT Ventricular Rate:  136 PR Interval:    QRS Duration: 75 QT Interval:  319 QTC  Calculation: 480 R Axis:   -28 Text Interpretation:  Atrial fibrillation Borderline left axis deviation Low voltage, precordial leads Consider anterior infarct ST depression, probably rate related since previous tracing, converted from sinus rhythm to Afib RVR Confirmed by Theotis Burrow [phone]) on 12/12/2018 12:58:20 PM   Radiology Dg Chest Port 1 View  Result Date: 12/12/2018 CLINICAL DATA:  Rapid and irregular heart rate EXAM: PORTABLE CHEST 1 VIEW COMPARISON:  Chest x-rays dated 05/23/2018 and 02/01/2016. FINDINGS: Heart size and mediastinal contours are stable. Central parabronchial thickening/edema bilaterally. Probable cephalization. No confluent opacity to suggest consolidating pneumonia. No pleural effusion or pneumothorax seen. Osseous structures about the chest are unremarkable. IMPRESSION: Acute bronchitic change. This could represent mild CHF or atypical/viral pneumonia. No evidence of consolidating pneumonia or overt alveolar pulmonary edema. Electronically Signed   By: Franki Cabot M.D.   On: 12/12/2018 13:50    Procedures .Critical Care Performed by: Sharlett Iles, MD Authorized by: Sharlett Iles, MD   Critical care provider statement:    Critical care time (minutes):  30   Critical care time was exclusive of:  Separately billable procedures and treating other patients   Critical care was necessary to treat or prevent imminent or life-threatening deterioration of the following conditions:  Cardiac failure   Critical care was time spent personally by me on the following activities:  Development of treatment plan with patient or surrogate, discussions with consultants, evaluation of patient's response to treatment, examination of patient, obtaining history from patient or surrogate, ordering and performing treatments and interventions, ordering and review of laboratory studies, ordering and review of radiographic studies, re-evaluation of patient's condition and review  of old charts   (including critical care time)  Medications Ordered in ED  Medications  heparin bolus via infusion 4,000 Units (has no administration in time range)  heparin ADULT infusion 100 units/mL (25000 units/272mL sodium chloride 0.45%) (has no administration in time range)  diltiazem (CARDIZEM) 100 mg in dextrose 5% 153mL (1 mg/mL) infusion (has no administration in time range)  diltiazem (CARDIZEM) injection 10 mg (10 mg Intravenous Given 12/12/18 1354)  diltiazem (CARDIZEM) injection 15 mg (15 mg Intravenous Given 12/12/18 1509)  furosemide (LASIX) injection 40 mg (40 mg Intravenous Given 12/12/18 1603)   CHA2DS2/VAS Stroke Risk Points  Current as of 2 hours ago     5 >= 2 Points: High Risk  1 - 1.99 Points: Medium Risk  0 Points: Low Risk    This is the only CHA2DS2/VAS Stroke Risk Points available for the past  year.: Last Change: N/A     Details    This score determines the patient's risk of having a stroke if the  patient has atrial fibrillation.       Points Metrics  1 Has Congestive Heart Failure:  Yes    Current as of 2 hours ago  0 Has Vascular Disease:  No    Current as of 2 hours ago  1 Has Hypertension:  Yes    Current as of 2 hours ago  2 Age:  75    Current as of 2 hours ago  0 Has Diabetes:  No    Current as of 2 hours ago  0 Had Stroke:  No  Had TIA:  No  Had thromboembolism:  No    Current as of 2 hours ago  1 Female:  Yes    Current as of 2 hours ago            Initial Impression / Assessment and Plan / ED Course  I have reviewed the triage vital signs and the nursing notes.  Pertinent labs & imaging results that were available during my care of the patient were reviewed by me and considered in my medical decision making (see chart for details).        Comfortable on exam, in A fib w/ RVR, HR variable 120s-130s, BP normal. No complaints. EKG confirms A fib RVR.  Gave initial dose of diltiazem 10 mg, persistent RVR therefore gave second bolus  of 15 mg and on repeat, her rate is in the 90s.  Lab work shows creatinine 2.26 which is slightly elevated from previous, BNP 900, high-sensitivity troponin 67, WBC 13.  Chest x-ray with mild CHF changes.  I suspect that she has been in A. fib with RVR for a while leading to heart failure exacerbation.  I have ordered IV Lasix.  I have also consulted cardiology for evaluation and recommendations.  CHADSVASC score of 5.  Initiated a heparin drip as patient may be considered for cardioversion if her echo shows no thrombus. HR later returned to RVR 110s-120s, therefore ordered diltiazem drip.   Discussed admission with [facility] hospitalist, Dr. Grandville Silos, I appreciate his assistance.  Patient admitted for further treatment.  Final Clinical Impressions(s) / ED Diagnoses   Final diagnoses:  None    ED Discharge Orders    None       , Wenda Overland, MD 12/12/18 [phone]

## 2018-12-12 NOTE — Consult Note (Addendum)
Cardiology Consultation:   Patient ID: Cassandra Ray MRN: [id number]; DOB: Sep 04, 1922  Admit date: 12/12/2018 Date of Consult: 12/12/2018  Primary Care Provider: Lawerance Cruel, MD Primary Cardiologist: Candee Furbish, MD  Primary Electrophysiologist:  None    Patient Profile:   Cassandra Ray is a 85 y.o. female with a hx of a questionable episode of afib in 2015, HTN, hypothyroidism, chronic diastolic HF and pulmonary HTN who is being seen today for the evaluation of atrial fibrillation w/ RVR at the request of Dr. Rex Kras, Emergency Medicine.   History of Present Illness:   Ms. Stream is followed by Dr. [name]. He has chronic diastolic HF. Most recent echo 05/25/2018 showed G1DD, normal LVEF 60-65%, moderate LVH and moderate pulmonary hypertension. There was question regarding possible atrial fibrillation in 2015, but EKGs were later reviewed by cardiology and p-waves were actually seen. No afib. She also had cardiac monitors which did not detect afib. No h/o CAD.   She resides at [facility] in independent living with her husband. Gets around well for her age and uses a walker for ambulation but no falls. She has has been feeling unwell for several days, mainly weakness and fatigue. No palpitations. Denies CP and dyspnea. No n/v,d fever, chills, cough or dysuria. Found to be in afib w/ RVR and brought to the ED for evaluation. Found to have AKI w/ SCr at 2.26. Last BMP 05/2018 showed SCr of 1.49. As noted above, no d/v. Reports normal PO intake. No prolonged heat exposure. No urinary symptoms. K is WNL at 4.0. WBC 13.0. Afebrile. COVID negative. CXR shows acute bronchitic change. This could represent mild CHF or atypical/viral pneumonia. No evidence of consolidating pneumonia or overt alveolar pulmonary edema. BNP 899. Hs Troponin abnormal at 67 ng/L. Repeat troponin pending. TSH is low at 0.147.   HR in the ED when resting is in the 90s, but increases to the 120s when she sits up in  bed to participate in physical exam. Diffuse wheezing noted on lung exam. Has received a dose of IV Cardizem, 10 mg followed by 15 mg. No drip at current. Also got a dose of IV Lasix, 40 mg.  Her CHA2DS2 VASc score is at least 4 for HTN, CHF and age > 73. Denies prior h/o stroke/ TIA.   Heart Pathway Score:     Past Medical History:  Diagnosis Date  . Actinic keratoses   . Arthritis    back   . Atrial fibrillation ([place])    a. questionable episode in 2015 in [facility]. b. event monitor through December/January 2014/2015 which showed no evidence of atrial fibrillation.  . Breast cancer ([place])    Breast cancer (right)  . Chronic diastolic CHF (congestive heart failure) ([place])   . Compression fracture of L3 lumbar vertebra   . Dysphagia   . GERD (gastroesophageal reflux disease)    takes otc Pepcid as needed  . Hiatal hernia 11/27/2014  . HTN (hypertension)   . Hypothyroidism   . Insomnia   . Osteopenia   . Pleural effusion     Past Surgical History:  Procedure Laterality Date  . BREAST LUMPECTOMY Right 1995   lumpectomy  . COLONOSCOPY    . EYE SURGERY Bilateral    cataract w/ lens implant  . KYPHOPLASTY N/A 09/18/2013   Procedure: KYPHOPLASTY;  Surgeon: Sinclair Ship, MD;  Location: [place];  Service: Orthopedics;  Laterality: N/A;  Lumbar 3 kyphoplasty  . TONSILLECTOMY  Home Medications:  Prior to Admission medications   Medication Sig Start Date End Date Taking? Authorizing Provider  acetaminophen (TYLENOL) 500 MG tablet Take 500-1,000 mg by mouth every 8 (eight) hours as needed for mild pain or headache.    [provider]  B Complex-C (B-COMPLEX WITH VITAMIN C) tablet Take 1 tablet by mouth daily.    [provider]  Calcium-Phosphorus-Vitamin D (CITRACAL +D3 PO) Take 1 tablet by mouth daily.    [provider]  Cholecalciferol (VITAMIN D3) 2000 units TABS Take 2,000 Units by mouth daily.     [provider]  furosemide (LASIX) 40 MG  tablet Take 1 tablet (40 mg total) by mouth daily. 05/26/18   Hosie Poisson, MD  Glucosamine HCl 1500 MG TABS Take 1,500 mg by mouth 2 (two) times daily.     [provider]  levothyroxine (SYNTHROID, LEVOTHROID) 112 MCG tablet Take 112 mcg by mouth daily before breakfast.    [provider]  lipase/protease/amylase (CREON) 12000 units CPEP capsule Take 72,000 Units by mouth 2 (two) times daily.    [provider]  loperamide (IMODIUM A-D) 2 MG tablet Take 2 mg by mouth as needed for diarrhea or loose stools.    [provider]  losartan (COZAAR) 50 MG tablet Take 50 mg by mouth daily.    [provider]  Melatonin 5 MG TABS Take 5 mg by mouth at bedtime as needed (for sleep).     [provider]  metoprolol tartrate (LOPRESSOR) 50 MG tablet Take 1 tablet (50 mg total) by mouth 2 (two) times daily. 05/25/18   Hosie Poisson, MD  nitroGLYCERIN (NITROSTAT) 0.4 MG SL tablet Place 0.4 mg under the tongue every 5 (five) minutes as needed for chest pain.    [provider]  pantoprazole (PROTONIX) 40 MG tablet Take 40 mg by mouth daily.    [provider]  Probiotic Product (ALIGN) 4 MG CAPS Take 4 mg by mouth daily.    [provider]  SYMBICORT 80-4.5 MCG/ACT inhaler Take 2 puffs by mouth every morning.  12/09/14   [provider]    Inpatient Medications: Scheduled Meds: . furosemide  40 mg Intravenous STAT  . heparin  4,000 Units Intravenous Once   Continuous Infusions: . heparin     PRN Meds:   Allergies:    Allergies  Allergen Reactions  . Tape Other (See Comments)    Patient prefers easy-release tape    Social History:   Social History   Socioeconomic History  . Marital status: Married    Spouse name: Not on file  . Number of children: Not on file  . Years of education: Not on file  . Highest education level: Not on file  Occupational History  . Not on file  Social Needs  . Financial  resource strain: Not on file  . Food insecurity    Worry: Not on file    Inability: Not on file  . Transportation needs    Medical: Not on file    Non-medical: Not on file  Tobacco Use  . Smoking status: Former Research scientist (life sciences)  . Smokeless tobacco: Never Used  . Tobacco comment: only in college  Substance and Sexual Activity  . Alcohol use: Yes    Alcohol/week: 7.0 standard drinks    Types: 7 Glasses of wine per week    Comment: 1 glass of wine a day  . Drug use: No  . Sexual activity: Not on file  Lifestyle  . Physical activity    Days per week: Not on file    Minutes per session: Not on file  . Stress: Not on file  Relationships  . Social Herbalist on phone: Not on file    Gets together: Not on file    Attends religious service: Not on file    Active member of club or organization: Not on file    Attends meetings of clubs or organizations: Not on file    Relationship status: Not on file  . Intimate partner violence    Fear of current or ex partner: Not on file    Emotionally abused: Not on file    Physically abused: Not on file    Forced sexual activity: Not on file  Other Topics Concern  . Not on file  Social History Narrative  . Not on file    Family History:    Family History  Problem Relation Age of Onset  . CAD Mother   . Diabetes Mellitus II Mother   . Heart failure Mother   . Heart failure Father   . Prostate cancer Brother   . Kidney cancer Brother   . Heart disease Sister   . Esophageal cancer Neg Hx   . Colon cancer Neg Hx      ROS:  Please see the history of present illness.   All other ROS reviewed and negative.     Physical Exam/Data:   Vitals:   12/12/18 1402 12/12/18 1508 12/12/18 1515 12/12/18 1530  BP: (!) 113/96 109/79 106/78 97/73  Pulse: (!) 104 (!) 125 91 97  Resp: (!) 22 (!) 21 (!) 22 (!) 40  SpO2: 97% 96% 97% 95%  Weight:    78.8 kg  Height:    5\' 4"  (1.626 m)   No intake or output data in the 24 hours ending 12/12/18  1601 Last 3 Weights 12/12/2018 06/26/2018 05/25/2018  Weight (lbs) 173 lb 12.8 oz 173 lb 12.8 oz 175 lb 0.7 oz  Weight (kg) 78.835 kg 78.835 kg 79.4 kg     Body mass index is 29.83 kg/m.  General:  Elderly WF in no acute distress, moderately obese HEENT: normal Lymph: no adenopathy Neck: no JVD Endocrine:  No thryomegaly Vascular: No carotid bruits; FA pulses 2+ bilaterally without bruits  Cardiac:  irregularly irregular rhythm, tachy rate  Lungs:  Decreased BS bilaterally w/ diffuse wheezing Abd: soft, nontender, no hepatomegaly  Ext: no edema Musculoskeletal:  No deformities, BUE and BLE strength normal and equal Skin: warm and dry  Neuro:  CNs 2-12 intact, no focal abnormalities noted Psych:  Normal affect   EKG:  The EKG was personally reviewed and demonstrates:  Atrial fibrillation w/ RVR 136 bpm, slight ST depression in V5 and V6. May be rate related.  Telemetry:  Telemetry was personally reviewed and demonstrates:  Atrial fibrillation, 90s-130s  Relevant CV Studies: 2D Echo 05/25/28 Study Conclusions  - Left ventricle: The cavity size was normal. Wall thickness was   increased in a pattern of mild LVH. There was moderate focal   basal hypertrophy of the septum. No significant LVOT obstruction,   however minimally increased LVOT velocity. Systolic function was   normal. The estimated ejection fraction was in the range of 60%   to 65%. Wall motion was normal; there were no regional wall   motion abnormalities. Doppler parameters are consistent with   abnormal left ventricular relaxation (grade 1 diastolic   dysfunction). The  Ee&' ratio is >15, suggesting elevated LV   filling pressure. - Mitral valve: Calcified annulus. There was trivial regurgitation.   Valve area by continuity equation (using LVOT flow): 1.67 cm^2. - Left atrium: The atrium was normal in size. - Right atrium: The atrium was mildly dilated. - Tricuspid valve: There was mild regurgitation. - Pulmonary  arteries: PA peak pressure: 44 mm Hg (S) + RAP. - Systemic veins: Not visualized  Laboratory Data:  High Sensitivity Troponin:   Recent Labs  Lab 12/12/18 1225  TROPONINIHS 67*     Cardiac EnzymesNo results for input(s): TROPONINI in the last 168 hours. No results for input(s): TROPIPOC in the last 168 hours.  Chemistry Recent Labs  Lab 12/12/18 1225  NA 136  K 4.0  CL 101  CO2 22  GLUCOSE 98  BUN 46*  CREATININE 2.26*  CALCIUM 9.1  GFRNONAA 18*  GFRAA 21*  ANIONGAP 13    Recent Labs  Lab 12/12/18 1225  PROT 6.4*  ALBUMIN 3.1*  AST 31  ALT 21  ALKPHOS 74  BILITOT 1.1   Hematology Recent Labs  Lab 12/12/18 1225  WBC 13.0*  RBC 4.10  HGB 13.0  HCT 38.6  MCV 94.1  MCH 31.7  MCHC 33.7  RDW 13.2  PLT 175   BNP Recent Labs  Lab 12/12/18 1225  BNP 899.9*    DDimer No results for input(s): DDIMER in the last 168 hours.   Radiology/Studies:  Dg Chest Port 1 View  Result Date: 12/12/2018 CLINICAL DATA:  Rapid and irregular heart rate EXAM: PORTABLE CHEST 1 VIEW COMPARISON:  Chest x-rays dated 05/23/2018 and 02/01/2016. FINDINGS: Heart size and mediastinal contours are stable. Central parabronchial thickening/edema bilaterally. Probable cephalization. No confluent opacity to suggest consolidating pneumonia. No pleural effusion or pneumothorax seen. Osseous structures about the chest are unremarkable. IMPRESSION: Acute bronchitic change. This could represent mild CHF or atypical/viral pneumonia. No evidence of consolidating pneumonia or overt alveolar pulmonary edema. Electronically Signed   By: Franki Cabot M.D.   On: 12/12/2018 13:50    Assessment and Plan:    Cassandra Ray is a 85 y.o. female with a hx of a questionable episode of afib in 2015, HTN, hypothyroidism, chronic diastolic HF and pulmonary HTN who is being seen today for the evaluation of atrial fibrillation w/ RVR at the request of Dr. Rex Kras, Emergency Medicine.   1. New Onset Atrial  Fibrillation w/ RVR: main symptoms are weakness and fatigue. No CP or palpitations. Currently v-rates variable from the 90s to the 120s. Has received 2 doses of IV Cardizem, 10 mg followed by 15 mg. Currently not on drip. BP stable. Her arrhythmia may be triggered by acute pulmonary illness. Lung exam notable for bilateral wheezing and decreased BS. CXR findings concerning for possible atypical/viral pneumonia. WBC elevated at 13. COVID negative. Afebrile. Will defer further w/u to IM. Also recommend checking a UA to r/o UTI. TSH is also low at 0.147. Needs f/u free T3/T4. Continue to monitor on tele. We will start IV Cardizem drip for rate control. CHA2DS2 VASc score is at least 4 for HTN, CHF and age > 48. This patients CHA2DS2-VASc Score and unadjusted Ischemic Stroke Rate (% per year) is equal to 4.8 % stroke rate/year from a score of 4. Recommend anticoagulation. Will chose Eliquis. Based on dosing criteria, she will need low dose 2.5 mg BID (Age >80 and SCr > 1.5). we will order this.   2. ? PNA: bilateral wheezing noted on  exam. WBC ct 13. Afebrile. COVID negative.   CXR shows acute bronchitic change. This could represent mild CHF or atypical/viral pneumonia. Further w/u and management per IM.   3. Acute on Chronic Diastolic CHF: BNP elevated at 899. CXR suggestive of mild CHF. She has received a dose of IV lasix 40 mg in the ED. Decreased BS and wheezing noted on exam, which could also be 2/2 acute CHF. No significant LEE. Will monitor UOP w/ strict I/Os. Also recommend daily weights. Closely monitor renal function. Will reassess in the AM. May need additional IV Lasix. Will monitor closely.   4. Elevated Hs Troponin: initial hs Trop 67 ng/dL. Repeat troponin pending. No chest pain. This may be demand ischemia in the setting of rapid afib, AKI and acute CHF. Will follow trend.   5. AKI: SCr 2.26 in the ED. Was 1.47 in Dec 2019. Agree w/ IV Lasix for CHF but avoid other nephrotoxic agents. F/u BMP in  the AM. Recommend UA to r/o UTI. Also recommend orthostatics (HR noted to increase from 90s>>130s, when she sat up in bed). ? Dehydration. Hold home losartan.   6. Hyperthyroidism: TSH is low at 0.147. She needs f/u Free T3/T4. Will order labs.      For questions or updates, please contact [place] Please consult [email or web address] for contact info under     Signed, Lyda Jester, PA-C  12/12/2018 4:01 PM   Personally seen and examined. Agree with above.   85 year old female with paroxysmal atrial fibrillation.  Previously prior EKGs were reviewed and P waves were seen.  Today telemetry and EKG looks more definitive for atrial fibrillation.  Because of this, anticoagulation will be needed.  Eliquis 2.5 mg twice a day makes sense.  She is gone back and forth between diltiazem and metoprolol.  I think since she started diltiazem here in the ER with IV pushes, we will continue with IV diltiazem to see if this helps control her rate.  She comes in fatigue today so previously I had thought that her metoprolol was causing fatigue but this may not be the case.  She does have a mild leukocytosis, x-ray with possible infiltrate, mildly elevated TSH as well.  Creatinine is also elevated from baseline.  She did receive some IV Lasix in the emergency department.  Keep close track of creatinine.  Mild wheeze heard on exam.  High-sensitivity troponin minimally elevated at 67 likely secondary to tachycardia arrhythmia.  We will follow along.  Candee Furbish, MD

## 2018-12-12 NOTE — Progress Notes (Signed)
ANTICOAGULATION CONSULT NOTE - Initial Consult  Pharmacy Consult for Heparin Indication: atrial fibrillation  Allergies  Allergen Reactions  . Tape Other (See Comments)    Patient prefers easy-release tape    Patient Measurements: Height: 5\' 4"  (162.6 cm) Weight: 173 lb 12.8 oz (78.8 kg) IBW/kg (Calculated) : 54.7 Heparin Dosing Weight: 71.5  Vital Signs: BP: 110/75 (07/09 1600) Pulse Rate: 120 (07/09 1600)  Labs: Recent Labs    12/12/18 1225  HGB 13.0  HCT 38.6  PLT 175  CREATININE 2.26*  TROPONINIHS 67*    Estimated Creatinine Clearance: 15.8 mL/min (A) (by C-G formula based on SCr of 2.26 mg/dL (H)).   Medical History: Past Medical History:  Diagnosis Date  . Actinic keratoses   . Arthritis    back   . Atrial fibrillation ([place])    a. questionable episode in 2015 in [facility]. b. event monitor through December/January 2014/2015 which showed no evidence of atrial fibrillation.  . Breast cancer ([place])    Breast cancer (right)  . Chronic diastolic CHF (congestive heart failure) ([place])   . Compression fracture of L3 lumbar vertebra   . Dysphagia   . GERD (gastroesophageal reflux disease)    takes otc Pepcid as needed  . Hiatal hernia 11/27/2014  . HTN (hypertension)   . Hypothyroidism   . Insomnia   . Osteopenia   . Pleural effusion     Medications:  Infusions:  . heparin      Assessment: Pt is a 89YOF w/ c/o rapid and irregular HR and stated that she hasn't felt like herself for the last 2 days. Pt has a hx of CHF. She was found to have new onset Afib w/ RVR. No anticoag PTA. Hgb 13 and Plts 175. Pharmacy to dose heparin  Goal of Therapy:  Heparin level 0.3-0.7 units/ml Monitor platelets by anticoagulation protocol: Yes   Plan:  Heparin 4000 unit IV bolus x1 then heparin 1000 units/hr IV Check 8 hour heparin level (0030) Monitor heparin level and CBC daily Monitor s/sx of bleeding  Natale Lay 12/12/2018,4:53 PM

## 2018-12-13 ENCOUNTER — Observation Stay (HOSPITAL_COMMUNITY): Payer: Medicare Other

## 2018-12-13 ENCOUNTER — Inpatient Hospital Stay (HOSPITAL_COMMUNITY): Payer: Medicare Other

## 2018-12-13 DIAGNOSIS — I130 Hypertensive heart and chronic kidney disease with heart failure and stage 1 through stage 4 chronic kidney disease, or unspecified chronic kidney disease: Secondary | ICD-10-CM | POA: Diagnosis present

## 2018-12-13 DIAGNOSIS — I248 Other forms of acute ischemic heart disease: Secondary | ICD-10-CM | POA: Diagnosis present

## 2018-12-13 DIAGNOSIS — I5033 Acute on chronic diastolic (congestive) heart failure: Secondary | ICD-10-CM | POA: Diagnosis present

## 2018-12-13 DIAGNOSIS — D649 Anemia, unspecified: Secondary | ICD-10-CM | POA: Diagnosis present

## 2018-12-13 DIAGNOSIS — I440 Atrioventricular block, first degree: Secondary | ICD-10-CM | POA: Diagnosis present

## 2018-12-13 DIAGNOSIS — G4700 Insomnia, unspecified: Secondary | ICD-10-CM | POA: Diagnosis present

## 2018-12-13 DIAGNOSIS — E876 Hypokalemia: Secondary | ICD-10-CM | POA: Diagnosis present

## 2018-12-13 DIAGNOSIS — I10 Essential (primary) hypertension: Secondary | ICD-10-CM | POA: Diagnosis not present

## 2018-12-13 DIAGNOSIS — E039 Hypothyroidism, unspecified: Secondary | ICD-10-CM | POA: Diagnosis present

## 2018-12-13 DIAGNOSIS — E0580 Other thyrotoxicosis without thyrotoxic crisis or storm: Secondary | ICD-10-CM | POA: Diagnosis present

## 2018-12-13 DIAGNOSIS — Z853 Personal history of malignant neoplasm of breast: Secondary | ICD-10-CM | POA: Diagnosis not present

## 2018-12-13 DIAGNOSIS — K219 Gastro-esophageal reflux disease without esophagitis: Secondary | ICD-10-CM | POA: Diagnosis present

## 2018-12-13 DIAGNOSIS — N189 Chronic kidney disease, unspecified: Secondary | ICD-10-CM | POA: Diagnosis not present

## 2018-12-13 DIAGNOSIS — Z8249 Family history of ischemic heart disease and other diseases of the circulatory system: Secondary | ICD-10-CM | POA: Diagnosis not present

## 2018-12-13 DIAGNOSIS — I4891 Unspecified atrial fibrillation: Secondary | ICD-10-CM

## 2018-12-13 DIAGNOSIS — N179 Acute kidney failure, unspecified: Secondary | ICD-10-CM

## 2018-12-13 DIAGNOSIS — Z833 Family history of diabetes mellitus: Secondary | ICD-10-CM | POA: Diagnosis not present

## 2018-12-13 DIAGNOSIS — N183 Chronic kidney disease, stage 3 (moderate): Secondary | ICD-10-CM | POA: Diagnosis present

## 2018-12-13 DIAGNOSIS — Z87891 Personal history of nicotine dependence: Secondary | ICD-10-CM | POA: Diagnosis not present

## 2018-12-13 DIAGNOSIS — Z66 Do not resuscitate: Secondary | ICD-10-CM | POA: Diagnosis present

## 2018-12-13 DIAGNOSIS — Z20828 Contact with and (suspected) exposure to other viral communicable diseases: Secondary | ICD-10-CM | POA: Diagnosis present

## 2018-12-13 DIAGNOSIS — D72828 Other elevated white blood cell count: Secondary | ICD-10-CM | POA: Diagnosis present

## 2018-12-13 DIAGNOSIS — Z7951 Long term (current) use of inhaled steroids: Secondary | ICD-10-CM | POA: Diagnosis not present

## 2018-12-13 DIAGNOSIS — I480 Paroxysmal atrial fibrillation: Secondary | ICD-10-CM | POA: Diagnosis present

## 2018-12-13 DIAGNOSIS — I272 Pulmonary hypertension, unspecified: Secondary | ICD-10-CM | POA: Diagnosis present

## 2018-12-13 LAB — CBC
HCT: 35.7 % — ABNORMAL LOW (ref 36.0–46.0)
Hemoglobin: 12.1 g/dL (ref 12.0–15.0)
MCH: 31.7 pg (ref 26.0–34.0)
MCHC: 33.9 g/dL (ref 30.0–36.0)
MCV: 93.5 fL (ref 80.0–100.0)
Platelets: 159 10*3/uL (ref 150–400)
RBC: 3.82 MIL/uL — ABNORMAL LOW (ref 3.87–5.11)
RDW: 13.3 % (ref 11.5–15.5)
WBC: 10.5 10*3/uL (ref 4.0–10.5)
nRBC: 0 % (ref 0.0–0.2)

## 2018-12-13 LAB — URINE CULTURE

## 2018-12-13 LAB — ECHOCARDIOGRAM COMPLETE
Height: 64 in
Weight: 2761.92 oz

## 2018-12-13 LAB — T3, FREE: T3, Free: 1.9 pg/mL — ABNORMAL LOW (ref 2.0–4.4)

## 2018-12-13 LAB — LIPID PANEL
Cholesterol: 128 mg/dL (ref 0–200)
HDL: 79 mg/dL (ref >40)
LDL Cholesterol: 36 mg/dL (ref 0–99)
Total CHOL/HDL Ratio: 1.6 RATIO
Triglycerides: 63 mg/dL (ref <150)
VLDL: 13 mg/dL (ref 0–40)

## 2018-12-13 LAB — BASIC METABOLIC PANEL
Anion gap: 14 (ref 5–15)
BUN: 48 mg/dL — ABNORMAL HIGH (ref 8–23)
CO2: 22 mmol/L (ref 22–32)
Calcium: 8.7 mg/dL — ABNORMAL LOW (ref 8.9–10.3)
Chloride: 104 mmol/L (ref 98–111)
Creatinine, Ser: 2.2 mg/dL — ABNORMAL HIGH (ref 0.44–1.00)
GFR calc Af Amer: 22 mL/min — ABNORMAL LOW (ref >60)
GFR calc non Af Amer: 19 mL/min — ABNORMAL LOW (ref >60)
Glucose, Bld: 101 mg/dL — ABNORMAL HIGH (ref 70–99)
Potassium: 3.4 mmol/L — ABNORMAL LOW (ref 3.5–5.1)
Sodium: 140 mmol/L (ref 135–145)

## 2018-12-13 LAB — T4, FREE: Free T4: 1.57 ng/dL — ABNORMAL HIGH (ref 0.61–1.12)

## 2018-12-13 LAB — MAGNESIUM: Magnesium: 2 mg/dL (ref 1.7–2.4)

## 2018-12-13 IMAGING — US US RENAL
1 series · 14 of 25 positions shown · non-contrast
Comparison: Ultrasound [DATE]

CLINICAL DATA: Acute renal failure.

EXAM:
RENAL / URINARY TRACT ULTRASOUND COMPLETE

[Series 1: us renal · 14 of 36 slices shown]
[im 1/36]
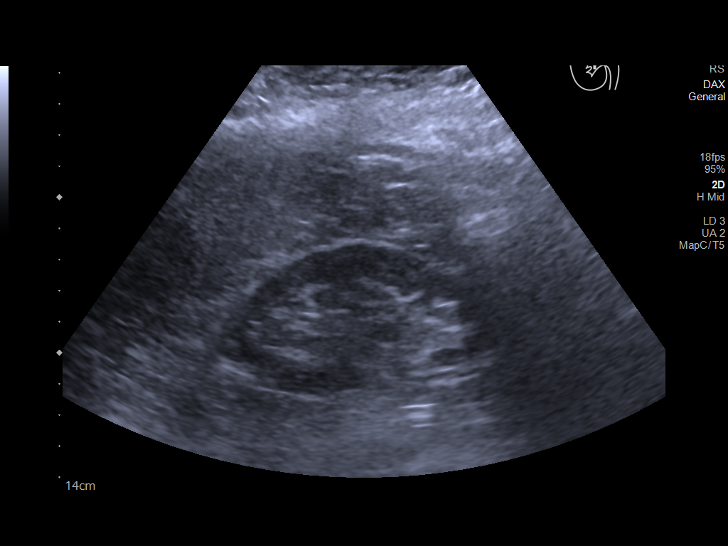
[im 3/36]
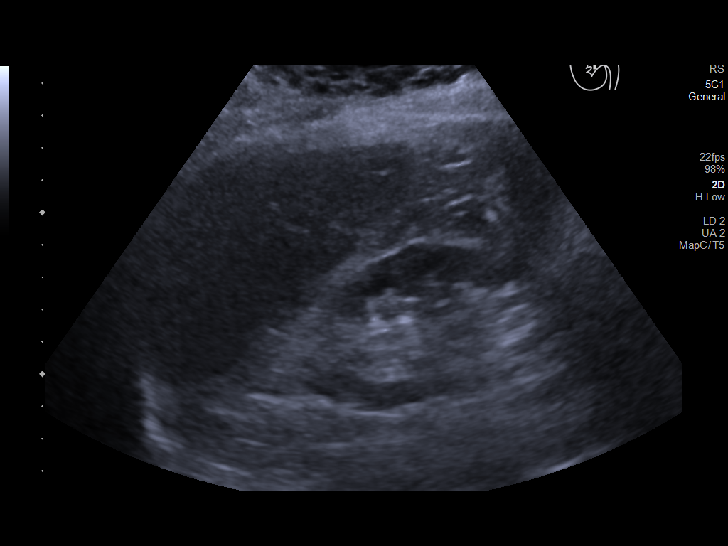
[im 6/36]
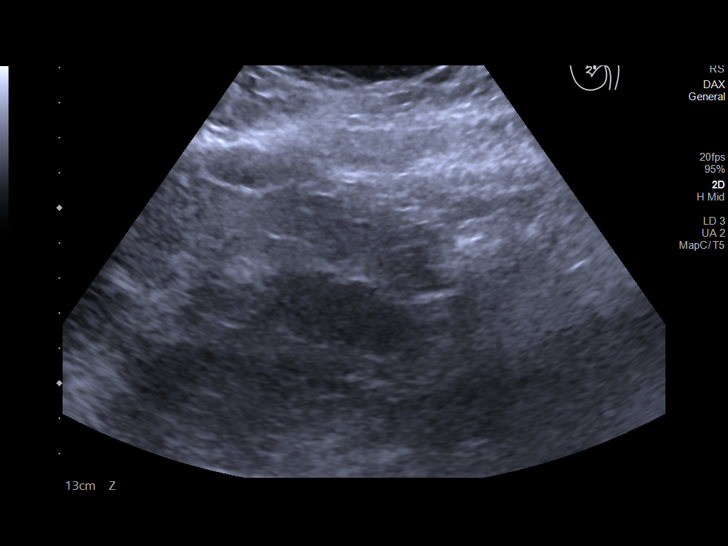
[im 9/36]
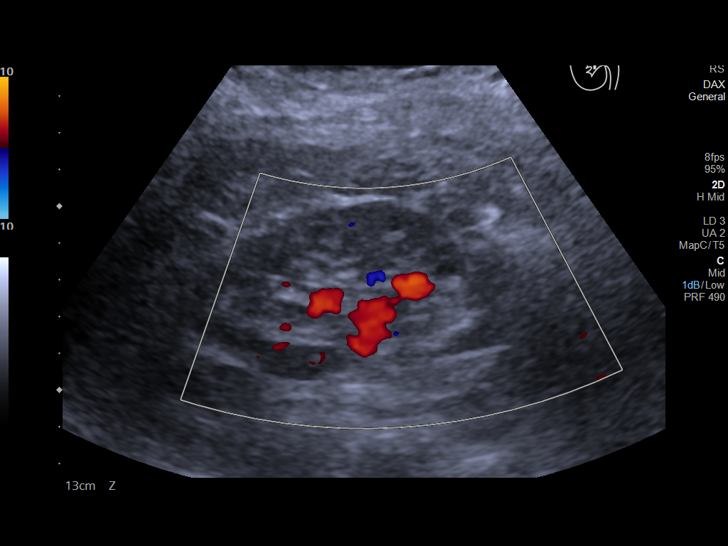
[im 12/36]
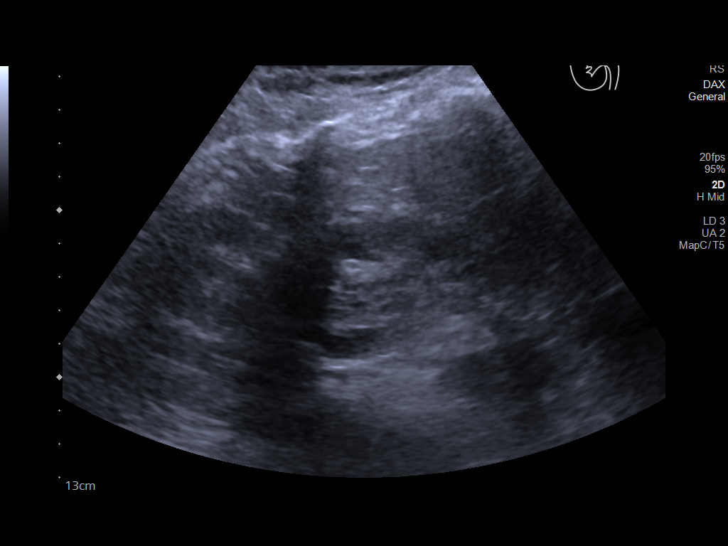
[im 14/36]
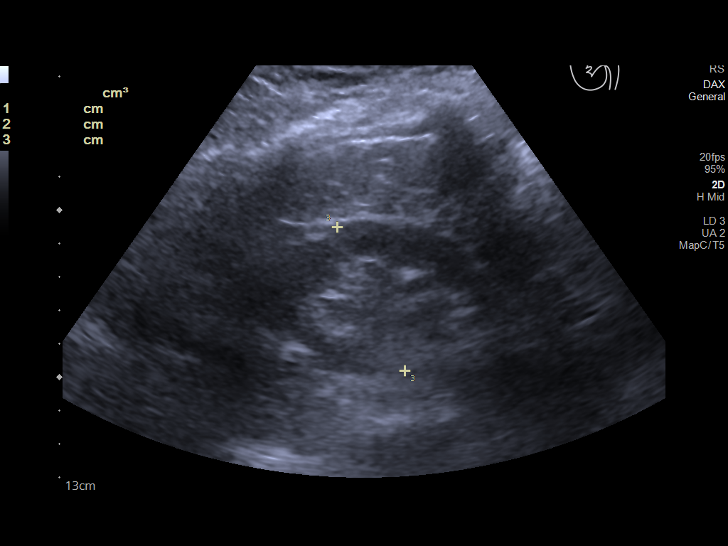
[im 17/36]
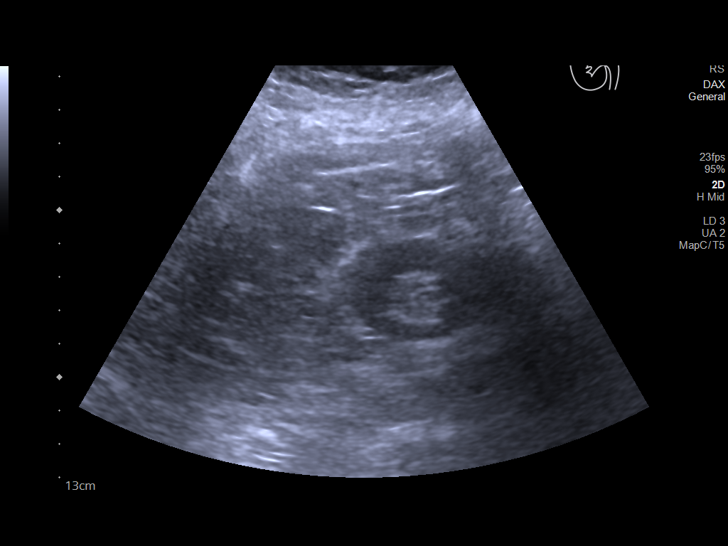
[im 19/36]
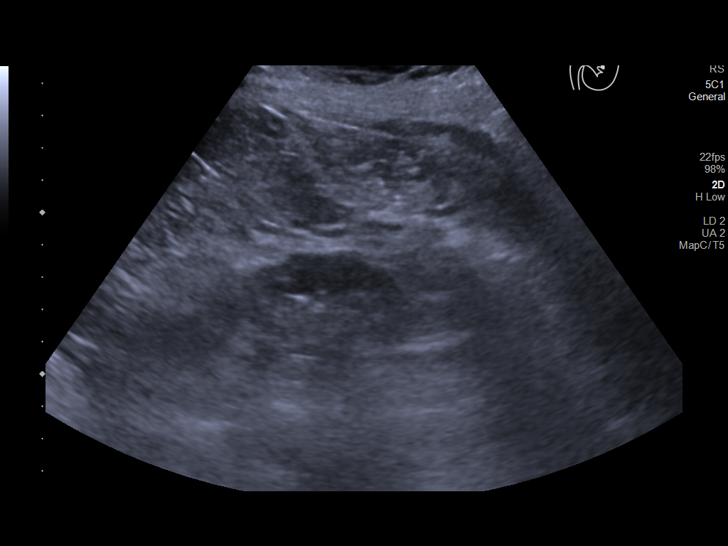
[im 22/36]
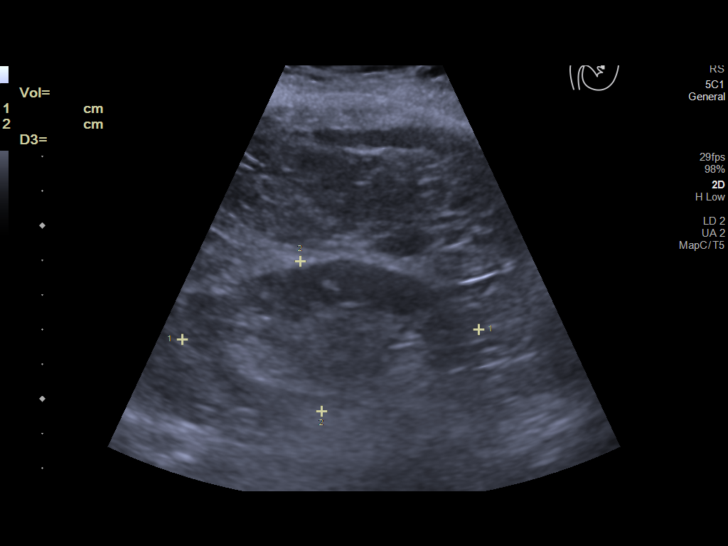
[im 24/36]
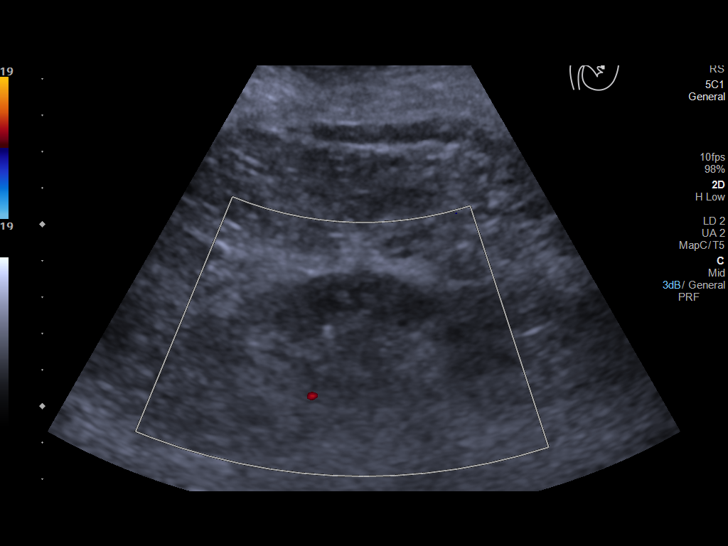
[im 27/36]
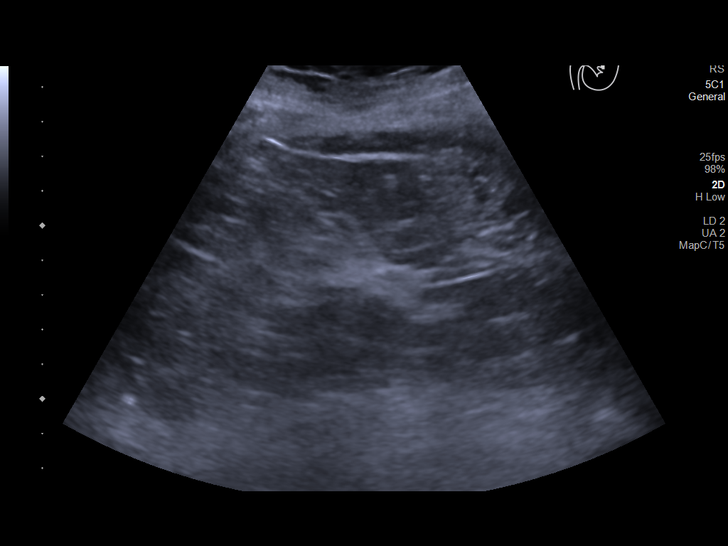
[im 30/36]
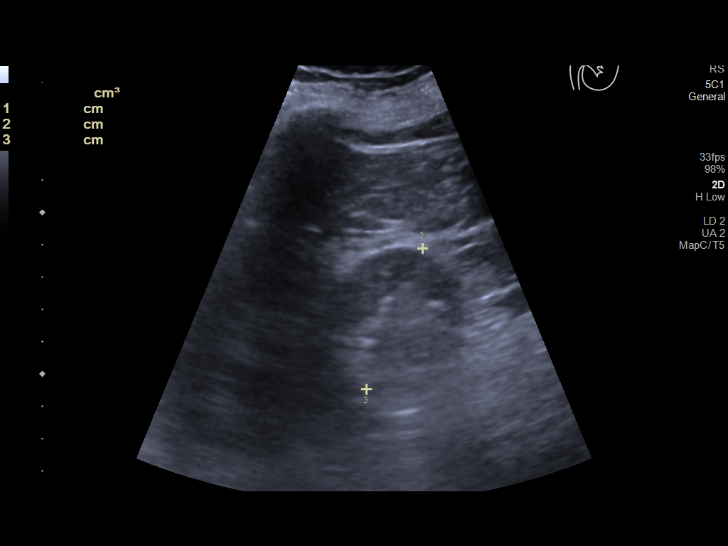
[im 33/36]
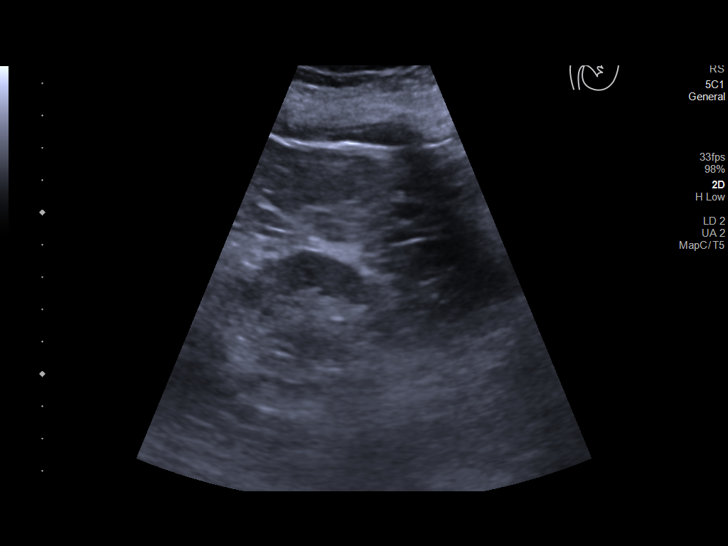
[im 36/36]
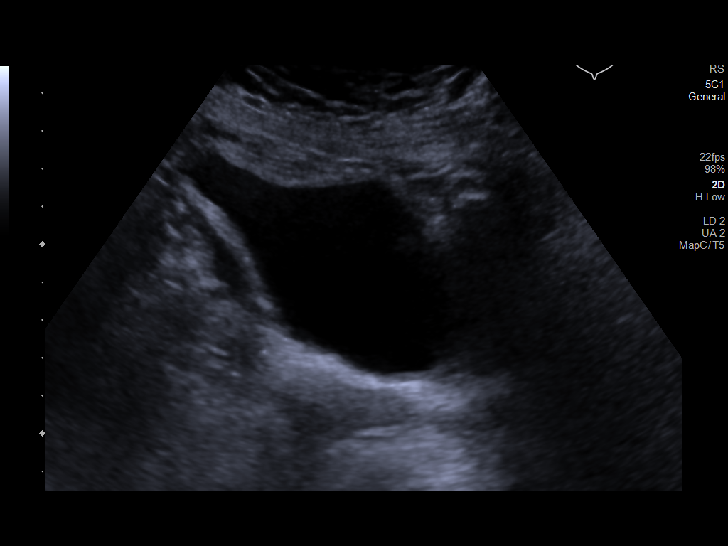

[14 of 25 positions shown; findings below may reference images not displayed]

FINDINGS: Right Kidney:

Renal measurements: 8.6 x 4.9 x 4.8 cm = volume: 105 mL. Normal
echogenicity. Cortical thinning throughout the right kidney without
hydronephrosis. No suspicious renal lesion.

Left Kidney:

Renal measurements: 8.6 x 4.4 x 4.7 cm = volume: 92 mL. Normal
echogenicity. Cortical thinning without hydronephrosis. No
suspicious renal lesion.

Bladder:

Appears normal for degree of bladder distention.
IMPRESSION: 1. Negative for hydronephrosis.
2. Cortical thinning in both kidneys.

## 2018-12-13 IMAGING — DX PORTABLE CHEST - 1 VIEW
1 series · 1 of 1 positions shown · non-contrast
Comparison: [DATE].

CLINICAL DATA: Shortness of breath

EXAM:
PORTABLE CHEST 1 VIEW

[chest ap]
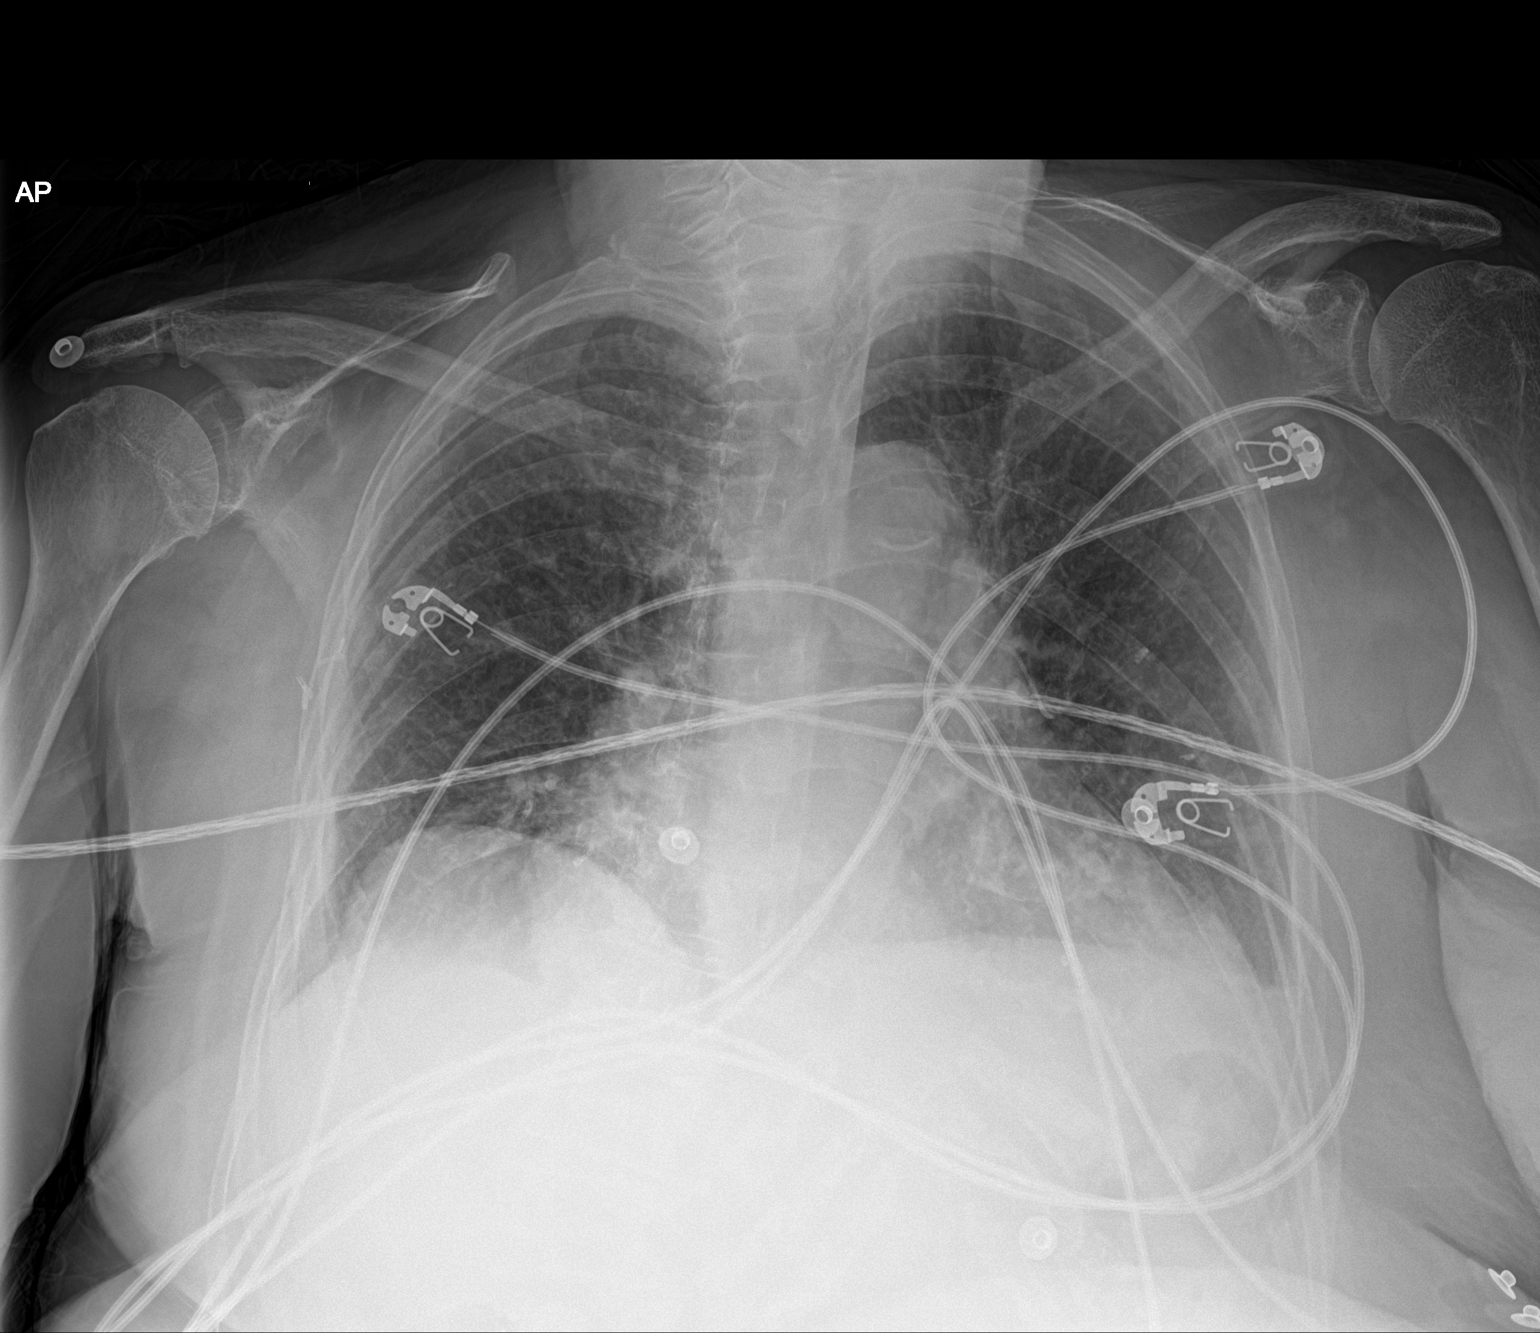

[1 of 1 positions shown; findings below may reference images not displayed]

FINDINGS: There is no appreciable edema or consolidation. There is slight left
base atelectasis. Heart is mildly enlarged with pulmonary
vascularity normal. No adenopathy. There is aortic atherosclerosis.
Bones are osteoporotic.
IMPRESSION: Mild left base atelectasis. No edema or consolidation. Heart mildly
enlarged. Aortic Atherosclerosis ([JX]-[JX]).

Bones osteoporotic.

## 2018-12-13 MED ORDER — LEVOTHYROXINE SODIUM 100 MCG PO TABS
100.0000 ug | ORAL_TABLET | Freq: Every day | ORAL | Status: DC
  Administered 2018-12-14 – 2018-12-17 (×4): 100 ug via ORAL
  Filled 2018-12-13 (×4): qty 1

## 2018-12-13 MED ORDER — SODIUM CHLORIDE 0.9 % IV BOLUS: 250.0000 mL | Freq: Once | INTRAVENOUS | Status: DC

## 2018-12-13 MED ORDER — IPRATROPIUM-ALBUTEROL 0.5-2.5 (3) MG/3ML IN SOLN: 3.0000 mL | Freq: Three times a day (TID) | RESPIRATORY_TRACT | Status: DC

## 2018-12-13 MED ORDER — POTASSIUM CHLORIDE 20 MEQ PO PACK
20.0000 meq | PACK | Freq: Once | ORAL | Status: AC
  Administered 2018-12-13: 20 meq via ORAL
  Filled 2018-12-13: qty 1

## 2018-12-13 MED ORDER — PERFLUTREN LIPID MICROSPHERE
1.0000 mL | INTRAVENOUS | Status: AC | PRN
  Filled 2018-12-13: qty 10

## 2018-12-13 NOTE — Progress Notes (Signed)
Echocardiogram 2D Echocardiogram has been performed.  [name] 12/13/2018, 1:35 PM

## 2018-12-13 NOTE — Evaluation (Signed)
Physical Therapy Evaluation Patient Details Name: Cassandra Ray MRN: [id number] DOB: 1923-05-27 Today's Date: 12/13/2018   History of Present Illness  85 y.o. female with medical history significant of ??  History of A. fib in 2015, hypertension, hypothyroidism, chronic diastolic heart failure, pulmonary hypertension, gastroesophageal reflux disease, history of right breast cancer status post lumpectomy presented to the ED from independent living facility 2-day history of generalized weakness and lethargy per patient. Admitted for observation 12/12/18 of new onset of A-fib in the presence of Acute on chronic heart failure.   Clinical Impression  PTA pt living with husband in independent living cottage at [facility] Pt independent ambulating with RW, and able to perform bathing and dressing, facility provides meals. Pt is currently limited in safe mobility by decreased strength and endurance in presence of A-fib. Pt is currently supervision for bed mobility and transfers and min guard for ambulation of 8 feet. Ambulation limited due to increase of HR to 163 bpm. Pt returned to bed for ECHO however PT encouraged nursing staff to get pt OOB to maintain strength. PT recommends HHPT level rehab at [facility]. PT will continue to follow acutely.     Follow Up Recommendations Home health PT;Supervision/Assistance - 24 hour    Equipment Recommendations  None recommended by PT    Recommendations for Other Services       Precautions / Restrictions Precautions Precautions: None Restrictions Weight Bearing Restrictions: No      Mobility  Bed Mobility Overal bed mobility: Needs Assistance Bed Mobility: Supine to Sit;Sit to Supine     Supine to sit: Supervision Sit to supine: Supervision   General bed mobility comments: supervision for safety, requires extra time and effort   Transfers Overall transfer level: Needs assistance Equipment used: Rolling walker (2 wheeled) Transfers: Sit  to/from Stand Sit to Stand: Min guard         General transfer comment: min guard for safety, good hand placement, and power up and steadying  Ambulation/Gait Ambulation/Gait assistance: Min guard Gait Distance (Feet): 8 Feet Assistive device: Rolling walker (2 wheeled) Gait Pattern/deviations: Step-through pattern;Decreased step length - right;Decreased step length - left;Shuffle;Trunk flexed Gait velocity: slowed Gait velocity interpretation: <1.31 ft/sec, indicative of household ambulator General Gait Details: min guard for safety, vc for proximity to RW, HR reached 163 and returned to bed with decreased gait speed and stability, no outside assist required         Balance Overall balance assessment: Needs assistance Sitting-balance support: No upper extremity supported;Feet supported Sitting balance-Leahy Scale: Fair     Standing balance support: Bilateral upper extremity supported Standing balance-Leahy Scale: Poor Standing balance comment: requires UE support                              Pertinent Vitals/Pain Pain Assessment: 0-10 Pain Score: 2  Pain Location: back  Pain Descriptors / Indicators: Aching Pain Intervention(s): Limited activity within patient's tolerance;Monitored during session;Premedicated before session;Repositioned    Home Living Family/patient expects to be discharged to:: Private residence Living Arrangements: Spouse/significant other Available Help at Discharge: Family;Available 24 hours/day Type of Home: Independent living facility Home Access: [place]: One level Home Equipment: [place] - 2 wheels;Walker - 4 wheels;Grab bars - tub/shower;Grab bars - toilet      Prior Function Level of Independence: Independent with assistive device(s)         Comments: uses RW for ambulation of limited  community distances, independent with bathing and dressing, facility provides meals and cleaning         Extremity/Trunk Assessment   Upper Extremity Assessment Upper Extremity Assessment: Generalized weakness    Lower Extremity Assessment Lower Extremity Assessment: Generalized weakness       Communication   Communication: HOH(wears hearing aides)  Cognition Arousal/Alertness: Awake/alert Behavior During Therapy: WFL for tasks assessed/performed Overall Cognitive Status: Within Functional Limits for tasks assessed                                        General Comments General comments (skin integrity, edema, etc.): HR ranged from 116 -162bpm, with 162 bpm with ambulation, returned to bed. SaO2 >90%O2 throughout session        Assessment/Plan    PT Assessment Patient needs continued PT services  PT Problem List Decreased strength;Decreased activity tolerance;Decreased mobility;Cardiopulmonary status limiting activity       PT Treatment Interventions DME instruction;Gait training;Functional mobility training;Therapeutic activities;Therapeutic exercise;Balance training;Cognitive remediation    PT Goals (Current goals can be found in the Care Plan section)  Acute Rehab PT Goals Patient Stated Goal: get home to husband PT Goal Formulation: With patient Time For Goal Achievement: 12/27/18 Potential to Achieve Goals: Fair    Frequency Min 3X/week    AM-PAC PT "6 Clicks" Mobility  Outcome Measure Help needed turning from your back to your side while in a flat bed without using bedrails?: None Help needed moving from lying on your back to sitting on the side of a flat bed without using bedrails?: A Little Help needed moving to and from a bed to a chair (including a wheelchair)?: A Little Help needed standing up from a chair using your arms (e.g., wheelchair or bedside chair)?: A Little Help needed to walk in hospital room?: A Little Help needed climbing 3-5 steps with a railing? : A Lot 6 Click Score: 18    End of Session Equipment Utilized During  Treatment: Gait belt Activity Tolerance: Other (comment)(limited by increased HR) Patient left: in bed;with call bell/phone within reach Nurse Communication: Mobility status;Other (comment)(increased HR ) PT Visit Diagnosis: Other abnormalities of gait and mobility (R26.89);Muscle weakness (generalized) (M62.81);Difficulty in walking, not elsewhere classified (R26.2)    Time: 2119-4174 PT Time Calculation (min) (ACUTE ONLY): 28 min   Charges:   PT Evaluation $PT Eval Moderate Complexity: 1 Mod PT Treatments $Gait Training: 8-22 mins        Naylah Cork B. Migdalia Dk PT, DPT Acute Rehabilitation Services Pager [phone] Office [phone]   [place] 12/13/2018, 12:19 PM

## 2018-12-13 NOTE — Progress Notes (Addendum)
PROGRESS NOTE    Cassandra Ray  TGY:[id number] DOB: [birth year] DOA: 12/12/2018 PCP: Lawerance Cruel, MD   Brief Narrative:  Patient is a pleasant 85 year old female medical history significant for questionable A. fib in 2015, hypertension, hypothyroidism, chronic diastolic heart failure, pulmonary hypertension, gastroesophageal reflux disease, history of right breast cancer status post lumpectomy presented to the ED from independent living facility with generalized weakness and noted to be in A. fib with RVR.  Patient admitted with A. fib with RVR placed on a Cardizem drip and also concern for heart failure.  Patient also noted to be in acute on chronic kidney disease stage III.  Cardiology consulted and are following.   Assessment & Plan:   Principal Problem:   New onset a-fib ([place]) Active Problems:   Acute on chronic diastolic CHF (congestive heart failure) (HCC)   HTN (hypertension)   Hypothyroidism   Pleural effusion, right   Acute kidney injury superimposed on chronic kidney disease (HCC)   Leukocytosis   Atrial fibrillation with RVR ([place])  1 new onset atrial fibrillation Patient noted in the past have a questionable history of A. fib however does not hold a diagnosis of A. fib. CHA2DS2VASC score > 4.  Questionable etiology.  Patient denies any chest pain.  Patient with concerns for acute on chronic diastolic CHF.  Cardiac enzymes elevated but plateaued and likely had demand ischemia.  Patient presented to the ED with generalized weakness and lethargy noted to be in new onset A. fib with RVR with heart rates in the 130s.  Patient received 2 pushes of IV Cardizem with some initial improvement with heart rate however heart rate noted to be back in the 130s and fluctuating and as such patient started on a Cardizem drip.  Patient currently on Cardizem drip.  Blood pressure borderline.  Patient with heart rates ranging from the 130s to the 160s and patient visibly with some shortness  of breath.  Continue Cardizem drip for now.  Monitor closely with diuresis.??  Cardioversion however will defer to cardiology.  2D echo pending. Patient seen in consultation by cardiology who are recommending anticoagulation and patient has been started on Eliquis.  Cardiology following and appreciate input and recommendations.  2.  Acute on chronic diastolic heart failure Likely secondary to problem #1.  Patient noted to have an elevated BNP of 899.9.  Patient with some complaints of some intermittent shortness of breath.  Physical exam with some bibasilar and scattered crackles noted.  Chest x-ray concerning for possible CHF versus atypical/viral pneumonia.  Patient afebrile.  Patient given a dose of Lasix 40 mg IV x1 in the ED.  Cardiac enzymes elevated but plateaued likely demand ischemia.    Patient with borderline blood pressure.  Urine output of 400 cc since admission.  Of 172.62 pounds from 173.8 pounds on admission.  Continue Lasix 40 mg IV daily, strict I's and O's, daily weights.  Patient on Cardizem.  Continue to hold beta-blocker for now.  Cardiology following.   3.  Hypothyroidism TSH at 0.147.    Free T4 is 2.10.  Will decrease home dose Synthroid to 100 MCG's daily.  Will need repeat thyroid function studies done in the outpatient setting in 4 to 6 weeks.    4.  Acute kidney injury on chronic kidney disease stage III Patient with a creatinine of 2.26 on admission last creatinine was 1.49 on May 25, 2018.  Likely prerenal azotemia secondary to acute CHF exacerbation, in the setting of ARB.  Creatinine with no significant change currently at 2.20.  Urine sodium of 87.  Urine creatinine of 24.96.  Fractional excretion of sodium is 5.79.  Renal ultrasound done showed bilateral cortical thinning however no evidence of hydronephrosis.  400 cc UOP over the past 24 hours.  Continue to hold ARB.  Continue IV Lasix and monitor renal function closely.  If worsening renal function will need to  consult with nephrology for further evaluation and management.  Follow for now.   5.  Leukocytosis Likely reactive leukocytosis.  Patient afebrile.  Chest x-ray with no acute infiltrate noted.    Urinalysis nitrite negative, leukocytes negative.  WBC trending down.  No need for antibiotics at this time.  Follow.   6.  Gastroesophageal reflux disease Continue Pepcid.  7.  Weakness/fatigue Likely secondary to problems #1 and 2.  PT/OT.  8.  Hypokalemia Likely secondary to diuresis.  Replete.    DVT prophylaxis: Eliquis Code Status: DNR Family Communication: Updated patient.  Tried to call her husband however nobody answered the telephone.   Disposition Plan: Home once A. fib is resolved and per cardiology.   Consultants:   Cardiology: Dr. [name] 12/12/2018  Procedures:   2D echo pending  Chest x-ray 12/13/2018  Renal ultrasound 12/13/2018  Antimicrobials:  None   Subjective: Visibly SOB. HR 130-160s.  Patient states no significant change with shortness of breath since admission.  Denies any chest pain.  Attempting to eat her breakfast.  Patient noted to be coughing while attempting to eat breakfast.  Objective: Vitals:   12/13/18 0609 12/13/18 0610 12/13/18 0646 12/13/18 0653  BP:  92/79 104/67 98/67  Pulse: 94 69 (!) 114 (!) 119  Resp:  (!) 21 (!) 23 (!) 21  Temp: 98.2 F (36.8 C)     TempSrc: Oral     SpO2: 90% 95% 94% 95%  Weight:      Height:        Intake/Output Summary (Last 24 hours) at 12/13/2018 1504 Last data filed at 12/13/2018 0600 Gross per 24 hour  Intake 172.89 ml  Output 400 ml  Net -227.11 ml   Filed Weights   12/12/18 1530 12/13/18 0500  Weight: 78.8 kg 78.3 kg    Examination:  General exam: Visibly short of breath. Respiratory system: Scattered crackles right greater than left.  Minimal wheezing.  No rhonchi.  Visibly short of breath.  Speaking in some choppy sentences. Cardiovascular system: irregularly irregularS1 & S2 heard,  RRR. No JVD, murmurs, rubs, gallops or clicks. No pedal edema. Gastrointestinal system: Abdomen is nondistended, soft and nontender. No organomegaly or masses felt. Normal bowel sounds heard. Central nervous system: Alert and oriented. No focal neurological deficits. Extremities: Symmetric 5 x 5 power. Skin: No rashes, lesions or ulcers Psychiatry: Judgement and insight appear normal. Mood & affect appropriate.     Data Reviewed: I have personally reviewed following labs and imaging studies  CBC: Recent Labs  Lab 12/12/18 1225 12/13/18 0500  WBC 13.0* 10.5  NEUTROABS 8.6*  --   HGB 13.0 12.1  HCT 38.6 35.7*  MCV 94.1 93.5  PLT 175 700   Basic Metabolic Panel: Recent Labs  Lab 12/12/18 1225 12/13/18 0500  NA 136 140  K 4.0 3.4*  CL 101 104  CO2 22 22  GLUCOSE 98 101*  BUN 46* 48*  CREATININE 2.26* 2.20*  CALCIUM 9.1 8.7*  MG  --  2.0   GFR: Estimated Creatinine Clearance: 16.2 mL/min (A) (by C-G formula based on SCr  of 2.2 mg/dL (H)). Liver Function Tests: Recent Labs  Lab 12/12/18 1225  AST 31  ALT 21  ALKPHOS 74  BILITOT 1.1  PROT 6.4*  ALBUMIN 3.1*   No results for input(s): LIPASE, AMYLASE in the last 168 hours. No results for input(s): AMMONIA in the last 168 hours. Coagulation Profile: No results for input(s): INR, PROTIME in the last 168 hours. Cardiac Enzymes: No results for input(s): CKTOTAL, CKMB, CKMBINDEX, TROPONINI in the last 168 hours. BNP (last 3 results) No results for input(s): PROBNP in the last 8760 hours. HbA1C: No results for input(s): HGBA1C in the last 72 hours. CBG: No results for input(s): GLUCAP in the last 168 hours. Lipid Profile: Recent Labs    12/13/18 0500  CHOL 128  HDL 79  LDLCALC 36  TRIG 63  CHOLHDL 1.6   Thyroid Function Tests: Recent Labs    12/12/18 1330  12/12/18 1653 12/13/18 0500  TSH 0.147*  --   --   --   FREET4  --    < > 2.10* 1.57*  T3FREE  --   --  1.9*  --    < > = values in this interval  not displayed.   Anemia Panel: No results for input(s): VITAMINB12, FOLATE, FERRITIN, TIBC, IRON, RETICCTPCT in the last 72 hours. Sepsis Labs: No results for input(s): PROCALCITON, LATICACIDVEN in the last 168 hours.  Recent Results (from the past 240 hour(s))  SARS Coronavirus 2 (CEPHEID - Performed in [place] hospital lab), Hosp Order     Status: None   Collection Time: 12/12/18  1:49 PM   Specimen: Nasopharyngeal Swab  Result Value Ref Range Status   SARS Coronavirus 2 NEGATIVE NEGATIVE Final    Comment: (NOTE) If result is NEGATIVE SARS-CoV-2 target nucleic acids are NOT DETECTED. The SARS-CoV-2 RNA is generally detectable in upper and lower  respiratory specimens during the acute phase of infection. The lowest  concentration of SARS-CoV-2 viral copies this assay can detect is 250  copies / mL. A negative result does not preclude SARS-CoV-2 infection  and should not be used as the sole basis for treatment or other  patient management decisions.  A negative result may occur with  improper specimen collection / handling, submission of specimen other  than nasopharyngeal swab, presence of viral mutation(s) within the  areas targeted by this assay, and inadequate number of viral copies  (<250 copies / mL). A negative result must be combined with clinical  observations, patient history, and epidemiological information. If result is POSITIVE SARS-CoV-2 target nucleic acids are DETECTED. The SARS-CoV-2 RNA is generally detectable in upper and lower  respiratory specimens dur ing the acute phase of infection.  Positive  results are indicative of active infection with SARS-CoV-2.  Clinical  correlation with patient history and other diagnostic information is  necessary to determine patient infection status.  Positive results do  not rule out bacterial infection or co-infection with other viruses. If result is PRESUMPTIVE POSTIVE SARS-CoV-2 nucleic acids MAY BE PRESENT.   A  presumptive positive result was obtained on the submitted specimen  and confirmed on repeat testing.  While 2019 novel coronavirus  (SARS-CoV-2) nucleic acids may be present in the submitted sample  additional confirmatory testing may be necessary for epidemiological  and / or clinical management purposes  to differentiate between  SARS-CoV-2 and other Sarbecovirus currently known to infect humans.  If clinically indicated additional testing with an alternate test  methodology [phone]) is advised. The SARS-CoV-2 RNA  is generally  detectable in upper and lower respiratory sp ecimens during the acute  phase of infection. The expected result is Negative. Fact Sheet for Patients:  StrictlyIdeas.no Fact Sheet for Healthcare Providers: BankingDealers.co.za This test is not yet approved or cleared by the [place] FDA and has been authorized for detection and/or diagnosis of SARS-CoV-2 by FDA under an Emergency Use Authorization (EUA).  This EUA will remain in effect (meaning this test can be used) for the duration of the COVID-19 declaration under Section 564(b)(1) of the Act, 21 U.S.C. section 360bbb-3(b)(1), unless the authorization is terminated or revoked sooner. Performed at [facility], [place] [address], [place], [place] [address]   MRSA PCR Screening     Status: None   Collection Time: 12/12/18  8:42 PM   Specimen: Nasal Mucosa; Nasopharyngeal  Result Value Ref Range Status   MRSA by PCR NEGATIVE NEGATIVE Final    Comment:        The GeneXpert MRSA Assay (FDA approved for NASAL specimens only), is one component of a comprehensive MRSA colonization surveillance program. It is not intended to diagnose MRSA infection nor to guide or monitor treatment for MRSA infections. Performed at [facility], [place] [address], [place], [place] [address]          Radiology Studies: US Renal  Result Date:  Dec 16, 2018 CLINICAL DATA:  Acute renal failure. EXAM: RENAL / URINARY TRACT ULTRASOUND COMPLETE COMPARISON:  Ultrasound 12/11/2014 FINDINGS: Right Kidney: Renal measurements: 8.6 x 4.9 x 4.8 cm = volume: 105 mL. Normal echogenicity. Cortical thinning throughout the right kidney without hydronephrosis. No suspicious renal lesion. Left Kidney: Renal measurements: 8.6 x 4.4 x 4.7 cm = volume: 92 mL. Normal echogenicity. Cortical thinning without hydronephrosis. No suspicious renal lesion. Bladder: Appears normal for degree of bladder distention. IMPRESSION: 1. Negative for hydronephrosis. 2. Cortical thinning in both kidneys. Electronically Signed   By: Markus Daft M.D.   On: 12-16-2018 12:15   Dg Chest Port 1 View  Result Date: 12-16-2018 CLINICAL DATA:  Shortness of breath EXAM: PORTABLE CHEST 1 VIEW COMPARISON:  December 12, 2018. FINDINGS: There is no appreciable edema or consolidation. There is slight left base atelectasis. Heart is mildly enlarged with pulmonary vascularity normal. No adenopathy. There is aortic atherosclerosis. Bones are osteoporotic. IMPRESSION: Mild left base atelectasis. No edema or consolidation. Heart mildly enlarged. Aortic Atherosclerosis (ICD10-I70.0). Bones osteoporotic. Electronically Signed   By: Lowella Grip III M.D.   On: 12-16-18 08:59   Dg Chest Port 1 View  Result Date: 12/12/2018 CLINICAL DATA:  Rapid and irregular heart rate EXAM: PORTABLE CHEST 1 VIEW COMPARISON:  Chest x-rays dated 05/23/2018 and 02/01/2016. FINDINGS: Heart size and mediastinal contours are stable. Central parabronchial thickening/edema bilaterally. Probable cephalization. No confluent opacity to suggest consolidating pneumonia. No pleural effusion or pneumothorax seen. Osseous structures about the chest are unremarkable. IMPRESSION: Acute bronchitic change. This could represent mild CHF or atypical/viral pneumonia. No evidence of consolidating pneumonia or overt alveolar pulmonary edema.  Electronically Signed   By: Franki Cabot M.D.   On: 12/12/2018 13:50        Scheduled Meds:  acidophilus  1 capsule Oral Daily   apixaban  2.5 mg Oral BID   famotidine  20 mg Oral Daily   furosemide  40 mg Intravenous Daily   ipratropium-albuterol  3 mL Nebulization TID   [START ON 12/14/2018] levothyroxine  100 mcg Oral QAC breakfast   lipase/protease/amylase  72,000 Units Oral TID WC  Melatonin  6 mg Oral QHS   mometasone-formoterol  2 puff Inhalation BID   pantoprazole  40 mg Oral Daily   potassium chloride  20 mEq Oral Once   sodium chloride flush  3 mL Intravenous Q12H   sodium chloride flush  3 mL Intravenous Q12H   Continuous Infusions:  sodium chloride     sodium chloride     diltiazem (CARDIZEM) infusion 5 mg/hr (12/12/18 1814)   sodium chloride       LOS: 0 days    Time spent: 40 minutes    Irine Seal, MD [facility]  If 7PM-7AM, please contact night-coverage [email or web address] 12/13/2018, 3:04 PM

## 2018-12-13 NOTE — Progress Notes (Signed)
Occupational Therapy Evaluation Patient Details Name: Cassandra Ray MRN: [id number] DOB: March 25, 1923 Today's Date: 12/13/2018    History of Present Illness 85 y.o. female with medical history significant of A. fib in 2015, hypertension, hypothyroidism, chronic diastolic heart failure, pulmonary hypertension, gastroesophageal reflux disease, history of right breast cancer status post lumpectomy presented to the ED from independent living facility 2-day history of generalized weakness and lethargy per patient. Admitted for observation 12/12/18 of new onset of A-fib in the presence of Acute on chronic heart failure.    Clinical Impression   Pt admitted with diagnosis listed above. PTA lives at [place] with husband. Pt is independent with BADLs and uses RW for functional mobility. Pt requiring min guard for functional mobility and standing components of ADLs. She does report she feels weaker than her baseline level.  Will continue to follow acutely in order to maximize safety and independence with ADLs at home.     Follow Up Recommendations  Home health OT;Supervision/Assistance - 24 hour    Equipment Recommendations  None recommended by OT    Recommendations for Other Services       Precautions / Restrictions Precautions Precautions: None Restrictions Weight Bearing Restrictions: No      Mobility Bed Mobility Overal bed mobility: (pt up in chair)   Transfers Overall transfer level: Needs assistance Equipment used: Rolling walker (2 wheeled) Transfers: Sit to/from Stand Sit to Stand: Min guard         General transfer comment: Guarding for safety    Balance                           ADL either performed or assessed with clinical judgement   ADL Overall ADL's : Needs assistance/impaired Eating/Feeding: Independent;Sitting   Grooming: Brushing hair;Sitting;Min guard Grooming Details (indicate cue type and reason): standing ~1 minute to brush  hair Upper Body Bathing: Supervision/ safety;Sitting   Lower Body Bathing: Min guard;Sit to/from stand   Upper Body Dressing : Supervision/safety;Sitting   Lower Body Dressing: Min guard;Sit to/from stand   Toilet Transfer: Min guard;Ambulation;RW   Toileting- Water quality scientist and Hygiene: Min guard;Sit to/from stand       Functional mobility during ADLs: Surveyor, minerals     Praxis      Pertinent Vitals/Pain Pain Assessment: No/denies pain      Hand Dominance     Extremity/Trunk Assessment Upper Extremity Assessment Upper Extremity Assessment: Generalized weakness   Lower Extremity Assessment Lower Extremity Assessment: Defer to PT evaluation       Communication Communication Communication: HOH   Cognition Arousal/Alertness: Awake/alert Behavior During Therapy: WFL for tasks assessed/performed Overall Cognitive Status: Within Functional Limits for tasks assessed                                     General Comments  HR ranging from 90s to 120s with activity in sitting and standing.     Exercises     Shoulder Instructions      Home Living Family/patient expects to be discharged to:: Private residence Living Arrangements: Spouse/significant other Available Help at Discharge: Family;Available 24 hours/day Type of Home: Independent living facility Home Access: Elevator     Home Layout: One level     Bathroom Shower/Tub: Occupational psychologist:  Handicapped height Bathroom Accessibility: Yes   Home Equipment: Walker - 2 wheels;Walker - 4 wheels;Grab bars - tub/shower;Grab bars - toilet   Additional Comments: pt uses rollator in facility and RW in community.  pt was getting SOB with her walking prior to illness.  Pt still gets down in bathtub for bathing.        Prior Functioning/Environment Level of Independence: Independent with assistive device(s)        Comments:  uses RW for ambulation of limited community distances, independent with bathing and dressing, facility provides meals and cleaning        OT Problem List: Decreased activity tolerance;Impaired balance (sitting and/or standing);Decreased knowledge of use of DME or AE      OT Treatment/Interventions: Self-care/ADL training;DME and/or AE instruction;Energy conservation;Therapeutic activities;Patient/family education    OT Goals(Current goals can be found in the care plan section) Acute Rehab OT Goals Patient Stated Goal: get home to husband OT Goal Formulation: With patient Time For Goal Achievement: 12/27/18 Potential to Achieve Goals: Good  OT Frequency: Min 2X/week   Barriers to D/C:            Co-evaluation              AM-PAC OT "6 Clicks" Daily Activity     Outcome Measure Help from another person eating meals?: None Help from another person taking care of personal grooming?: A Little(standing) Help from another person toileting, which includes using toliet, bedpan, or urinal?: A Little Help from another person bathing (including washing, rinsing, drying)?: A Little Help from another person to put on and taking off regular upper body clothing?: None Help from another person to put on and taking off regular lower body clothing?: A Little 6 Click Score: 20   End of Session Equipment Utilized During Treatment: Rolling walker Nurse Communication: Mobility status  Activity Tolerance: Patient tolerated treatment well Patient left: in chair;with call bell/phone within reach  OT Visit Diagnosis: Muscle weakness (generalized) (M62.81)                Time: 3329-5188 OT Time Calculation (min): 20 min Charges:  OT General Charges $OT Visit: 1 Visit OT Evaluation $OT Eval Moderate Complexity: 1 Mod    Trysta Showman, [place] [phone] 12/13/2018, 3:11 PM

## 2018-12-13 NOTE — Progress Notes (Signed)
Dr. Grandville Silos called and updated with latest BP 119/68, IV access removed due to leaking access. HR 130's . Attempted to start a new IV  Not successful, IV team consulted  STAT for cardizem and lasix  IV .Bil lung lobes fine crackles to coarse crackles L worse than right LLobes with noted occassional wheezing. NS 250 cc bolus held.

## 2018-12-13 NOTE — TOC Initial Note (Signed)
Transition of Care [facility]) - Initial/Assessment Note    Patient Details  Name: Cassandra Ray MRN: [id number] Date of Birth: Jul 08, 1922  [facility] CM/SW Contact:    Bethena Roys, RN Phone Number: 12/13/2018, 11:24 AM  Clinical Narrative:  Pt presented for New onset atrial fib. P[place] from [facility]. Patient has DME Rolling Walker.She uses [facility] for medications and they deliver to [employer]. PT/OT recommended and will see if patient will need HH PT Services once stable to transition back to IDL facility. Per patient husband will transport back once stable. No further needs at this time.                   Expected Discharge Plan: Home/Self Care Barriers to Discharge: Continued Medical Work up    Expected Discharge Plan and Services Expected Discharge Plan: Home/Self Care In-house Referral: NA Discharge Planning Services: CM Consult   Living arrangements for the past 2 months: [place]                  Prior Living Arrangements/Services Living arrangements for the past 2 months: [place] Lives with:: Spouse Patient language and need for interpreter reviewed:: Yes Do you feel safe going back to the place where you live?: Yes      Need for Family Participation in Patient Care: Yes (Comment) Care giver support system in place?: Yes (comment) Current home services: DME(pt has a rolling walker) Criminal Activity/Legal Involvement Pertinent to Current Situation/Hospitalization: No - Comment as needed   Permission Sought/Granted Permission sought to share information with : Family Supports      Emotional Assessment Appearance:: Appears stated age Attitude/Demeanor/Rapport: Engaged Affect (typically observed): Accepting Orientation: : Oriented to Self, Oriented to Place, Oriented to  Time, Oriented to Situation Alcohol / Substance Use: Not Applicable Psych  Involvement: No (comment)  Admission diagnosis:  AKI (acute kidney injury) ([place]) [N17.9] Atrial fibrillation with RVR ([place]) [I48.91] Patient Active Problem List   Diagnosis Date Noted  . New onset a-fib ([place]) 12/12/2018  . Acute kidney injury superimposed on chronic kidney disease ([place]) 12/12/2018  . Leukocytosis 12/12/2018  . Gastroesophageal reflux disease   . Weakness   . Tachycardia 05/24/2018  . Acute respiratory failure with hypoxia ([place]) 05/23/2018  . Bilateral impacted cerumen 11/22/2016  . Presbycusis of both ears 11/22/2016  . Pleural effusion, right 02/08/2016  . Acute on chronic diastolic CHF (congestive heart failure) ([place]) 02/08/2016  . Lower extremity edema 01/31/2016  . Chronic diastolic heart failure ([place]) 02/03/2015  . Hyponatremia 04/25/2014  . Shortness of breath   . Hypertensive urgency 04/24/2014  . Compression fracture 09/18/2013  . Knee pain 08/11/2013  . A-fib ([place]) 05/09/2013  . Back pain 05/09/2013  . HTN (hypertension)   . Hypothyroidism    PCP:  Lawerance Cruel, MD Pharmacy:   [facility], [place] [address] Phone: [phone] Fax: [phone]  [facility], [place] [facility] [address] [place] [address] Phone: [phone] Fax: [phone]     Social Determinants of Health (SDOH) Interventions    Readmission Risk Interventions No flowsheet data found.

## 2018-12-13 NOTE — Progress Notes (Addendum)
Progress Note  Patient Name: Cassandra Ray Date of Encounter: 12/13/2018  Primary Cardiologist: Candee Furbish, MD   Subjective   No significant overnight events. Patient states she did not sleep well last night but is otherwise doing okay this morning. No chest pain, palpitations, lightheadedness, or dizziness. She states her breathing is "alright."  She continues however to have rapid atrial fibrillation requiring IV medication.  Inpatient Medications    Scheduled Meds: . acidophilus  1 capsule Oral Daily  . apixaban  2.5 mg Oral BID  . famotidine  20 mg Oral Daily  . furosemide  40 mg Intravenous Daily  . levothyroxine  112 mcg Oral QAC breakfast  . lipase/protease/amylase  72,000 Units Oral TID WC  . Melatonin  6 mg Oral QHS  . mometasone-formoterol  2 puff Inhalation BID  . pantoprazole  40 mg Oral Daily  . sodium chloride flush  3 mL Intravenous Q12H  . sodium chloride flush  3 mL Intravenous Q12H   Continuous Infusions: . sodium chloride    . sodium chloride    . diltiazem (CARDIZEM) infusion 5 mg/hr (12/12/18 1814)  . sodium chloride     PRN Meds: sodium chloride, sodium chloride, acetaminophen, loperamide, ondansetron (ZOFRAN) IV, sodium chloride flush, sodium chloride flush   Vital Signs    Vitals:   12/13/18 0609 12/13/18 0610 12/13/18 0646 12/13/18 0653  BP:  92/79 104/67 98/67  Pulse: 94 69 (!) 114 (!) 119  Resp:  (!) 21 (!) 23 (!) 21  Temp: 98.2 F (36.8 C)     TempSrc: Oral     SpO2: 90% 95% 94% 95%  Weight:      Height:        Intake/Output Summary (Last 24 hours) at 12/13/2018 0757 Last data filed at 12/13/2018 0600 Gross per 24 hour  Intake 172.89 ml  Output 400 ml  Net -227.11 ml   Last 3 Weights 12/13/2018 12/12/2018 06/26/2018  Weight (lbs) 172 lb 9.9 oz 173 lb 12.8 oz 173 lb 12.8 oz  Weight (kg) 78.3 kg 78.835 kg 78.835 kg      Telemetry    Atrial fibrillation with ventricular rates ranging from the 100's to 150's. - Personally  Reviewed  ECG    No new ECG tracing available at this time. - Personally Reviewed  Physical Exam   GEN: Elderly caucasian female resting comfortably in no acute distress.   Neck: Supple. Cardiac: Tachycardic with irregularly irregular rhythm. No murmurs, rubs, or gallops.  Respiratory: No increased work of breathing. Mild bibasilar crackles and expiratory wheezing noted.  GI: Abdomen soft, non-distended, and non-tender. Bowel sounds present. MS: Mild edema of lower extremities. Shins very tender to the touch. No deformity. Neuro:  No focal deficits. Psych: Normal affect. Responds appropriately.  Labs    High Sensitivity Troponin:   Recent Labs  Lab 12/12/18 1225 12/12/18 1610  TROPONINIHS 67* 60*      Cardiac EnzymesNo results for input(s): TROPONINI in the last 168 hours. No results for input(s): TROPIPOC in the last 168 hours.   Chemistry Recent Labs  Lab 12/12/18 1225 12/13/18 0500  NA 136 140  K 4.0 3.4*  CL 101 104  CO2 22 22  GLUCOSE 98 101*  BUN 46* 48*  CREATININE 2.26* 2.20*  CALCIUM 9.1 8.7*  PROT 6.4*  --   ALBUMIN 3.1*  --   AST 31  --   ALT 21  --   ALKPHOS 74  --   BILITOT 1.1  --  GFRNONAA 18* 19*  GFRAA 21* 22*  ANIONGAP 13 14     Hematology Recent Labs  Lab 12/12/18 1225 12/13/18 0500  WBC 13.0* 10.5  RBC 4.10 3.82*  HGB 13.0 12.1  HCT 38.6 35.7*  MCV 94.1 93.5  MCH 31.7 31.7  MCHC 33.7 33.9  RDW 13.2 13.3  PLT 175 159    BNP Recent Labs  Lab 12/12/18 1225  BNP 899.9*     DDimer No results for input(s): DDIMER in the last 168 hours.   Radiology    Dg Chest Port 1 View  Result Date: 12/12/2018 CLINICAL DATA:  Rapid and irregular heart rate EXAM: PORTABLE CHEST 1 VIEW COMPARISON:  Chest x-rays dated 05/23/2018 and 02/01/2016. FINDINGS: Heart size and mediastinal contours are stable. Central parabronchial thickening/edema bilaterally. Probable cephalization. No confluent opacity to suggest consolidating pneumonia. No  pleural effusion or pneumothorax seen. Osseous structures about the chest are unremarkable. IMPRESSION: Acute bronchitic change. This could represent mild CHF or atypical/viral pneumonia. No evidence of consolidating pneumonia or overt alveolar pulmonary edema. Electronically Signed   By: Franki Cabot M.D.   On: 12/12/2018 13:50    Cardiac Studies   Echo pending.  Patient Profile    Cassandra Ray is a 85 y.o. female with a history of chronic diastolic CHF, questionable episode of atrial fibrillation in 2015, pulmonary hypertension, hypertension, and hypothyroidism who presented to the ED on 12/12/2018 for generalized weakness and fatigue and was found to be in atrial fibrillation with RVR. Patient was admitted by the Hospitalist service and Cardiology was consulted for assistance.  Assessment & Plan    New Onset Atrial Fibrillation with RVR - Telemetry shows atrial fibrillation with ventricular rates ranging from the 100's to 150's.  - Most recent Echo from 05/2018 showed LVEF of 60-65% with moderate focal basal hypertrophy of the septum but no significant LVOT obstruction, grade 1 diastolic dysfunction, and normal wall motion.  - Repeat Echo pending. - Potassium 3.4 today. Goal > 4.0. Supplement. - Magnesium 2.0. Goal > 2.0. Supplement as needed. - TSH low at 0.147. Free T4 high.  - Continue Cardizem drip at this time. Spoke with RN, IV was leaking so she has had continuous Cardizem this morning which may explain some of the uncontrolled rates. BP has been soft at times so will need to watch carefully. - Started on Eliquis 2.5mg  twice daily yesterday.  Acute on Chronic Diastolic CHF - BNP elevated at 899.9.  - Chest x-ray showed acute bronchitic changes which could represent mild CHF or atypical/viral pneumonia. - Echo pending. - Currently on IV Lasix 40mg  daily. Documented output of 400 mL since yesterday (unsure if this is accurate). Renal function stable. - Continue to monitor daily  weight, strict I/O's, and renal function.  Elevated Troponin - High-sensitivity troponin elevated and flat at 67 >> 60. Not consistent with ACS. - Patient denies any angina. - Likely demand ischemia in the setting of atrial fibrillation with RVR, acute CHF, and AKI.  Hypothyroidism - TSH low and Free T4 high. - Will defer any adjustments to home Synthroid to primary team.  Acute on Chronic Kidney Disease Stage III - Serum creatinine 2.26 on admission. Baseline around 1.3 to 1.5. - Repeat 2.20 this morning. - Continue to avoid nephrotoxic medications.  Leukocytosis - WBC 13.0 on admission. Repeat 10.0 this morning. - Chest x-ray shows possible pneumonia. - Patient afebrile.  - COVID-19 testing negative. - Management per primary team.  Otherwise, per primary team.  For questions  or updates, please contact [place] Please consult [email or web address] for contact info under        Signed, Darreld Mclean, PA-C  12/13/2018, 7:57 AM    Personally seen and examined. Agree with above.  Had significant coughing when I entered the room.  She was trying to eat breakfast. Breathing still not at baseline.  Crackles continued on exam. Overall still trying to control her atrial fibrillation heart rate.  Heart rate still not optimized.  Interestingly, her free T4 is a little bit high.  Her Synthroid dose has been decreased slightly on admission by Dr. Grandville Silos.  She has been struggling with significant fatigue.  Continuing to require IV diltiazem for rate control.  Candee Furbish, MD

## 2018-12-14 LAB — CBC
HCT: 34.3 % — ABNORMAL LOW (ref 36.0–46.0)
Hemoglobin: 11.7 g/dL — ABNORMAL LOW (ref 12.0–15.0)
MCH: 32 pg (ref 26.0–34.0)
MCHC: 34.1 g/dL (ref 30.0–36.0)
MCV: 93.7 fL (ref 80.0–100.0)
Platelets: 160 10*3/uL (ref 150–400)
RBC: 3.66 MIL/uL — ABNORMAL LOW (ref 3.87–5.11)
RDW: 13.2 % (ref 11.5–15.5)
WBC: 10.1 10*3/uL (ref 4.0–10.5)
nRBC: 0 % (ref 0.0–0.2)

## 2018-12-14 LAB — BASIC METABOLIC PANEL
Anion gap: 15 (ref 5–15)
BUN: 52 mg/dL — ABNORMAL HIGH (ref 8–23)
CO2: 19 mmol/L — ABNORMAL LOW (ref 22–32)
Calcium: 8.6 mg/dL — ABNORMAL LOW (ref 8.9–10.3)
Chloride: 102 mmol/L (ref 98–111)
Creatinine, Ser: 2.14 mg/dL — ABNORMAL HIGH (ref 0.44–1.00)
GFR calc Af Amer: 22 mL/min — ABNORMAL LOW (ref >60)
GFR calc non Af Amer: 19 mL/min — ABNORMAL LOW (ref >60)
Glucose, Bld: 111 mg/dL — ABNORMAL HIGH (ref 70–99)
Potassium: 3.6 mmol/L (ref 3.5–5.1)
Sodium: 136 mmol/L (ref 135–145)

## 2018-12-14 LAB — MAGNESIUM: Magnesium: 2.1 mg/dL (ref 1.7–2.4)

## 2018-12-14 MED ORDER — IPRATROPIUM BROMIDE 0.02 % IN SOLN
0.5000 mg | Freq: Three times a day (TID) | RESPIRATORY_TRACT | Status: DC
  Administered 2018-12-14: 11:00:00 0.5 mg via RESPIRATORY_TRACT
  Filled 2018-12-14: qty 2.5

## 2018-12-14 MED ORDER — AMIODARONE HCL 200 MG PO TABS
400.0000 mg | ORAL_TABLET | Freq: Two times a day (BID) | ORAL | Status: DC
  Administered 2018-12-14 – 2018-12-17 (×7): 400 mg via ORAL
  Filled 2018-12-14 (×7): qty 2

## 2018-12-14 MED ORDER — LEVALBUTEROL HCL 0.63 MG/3ML IN NEBU
0.6300 mg | INHALATION_SOLUTION | Freq: Three times a day (TID) | RESPIRATORY_TRACT | Status: DC
  Administered 2018-12-14: 11:00:00 0.63 mg via RESPIRATORY_TRACT
  Filled 2018-12-14: qty 3

## 2018-12-14 MED ORDER — LEVALBUTEROL HCL 0.63 MG/3ML IN NEBU
0.6300 mg | INHALATION_SOLUTION | RESPIRATORY_TRACT | Status: DC | PRN
  Administered 2018-12-16: 0.63 mg via RESPIRATORY_TRACT
  Filled 2018-12-14: qty 3

## 2018-12-14 MED ORDER — POTASSIUM CHLORIDE 20 MEQ PO PACK
40.0000 meq | PACK | Freq: Once | ORAL | Status: DC
  Filled 2018-12-14: qty 2

## 2018-12-14 MED ORDER — POTASSIUM CHLORIDE CRYS ER 20 MEQ PO TBCR
40.0000 meq | EXTENDED_RELEASE_TABLET | Freq: Once | ORAL | Status: AC
  Administered 2018-12-14: 40 meq via ORAL
  Filled 2018-12-14: qty 2

## 2018-12-14 MED ORDER — IPRATROPIUM BROMIDE 0.02 % IN SOLN
0.5000 mg | RESPIRATORY_TRACT | Status: DC | PRN
  Administered 2018-12-16: 12:00:00 0.5 mg via RESPIRATORY_TRACT
  Filled 2018-12-14: qty 2.5

## 2018-12-14 MED ORDER — BISACODYL 5 MG PO TBEC: 5.0000 mg | DELAYED_RELEASE_TABLET | Freq: Once | ORAL | Status: DC

## 2018-12-14 NOTE — Discharge Instructions (Signed)

## 2018-12-14 NOTE — Progress Notes (Signed)
Occupational Therapy Treatment Patient Details Name: Cassandra Ray MRN: [id number] DOB: Jun 11, 1922 Today's Date: 12/14/2018    History of present illness 85 y.o. female with medical history significant of ??  History of A. fib in 2015, hypertension, hypothyroidism, chronic diastolic heart failure, pulmonary hypertension, gastroesophageal reflux disease, history of right breast cancer status post lumpectomy presented to the ED from independent living facility 2-day history of generalized weakness and lethargy per patient. Admitted for observation 12/12/18 of new onset of A-fib in the presence of Acute on chronic heart failure.    OT comments  Pt. Seen for skilled OT tx session.  Reports "feeling bad all over" states she just doesn't want to do anything but says yesterday she did feel like doing things.  Max encouragement for bed mobility in hopes of progression to chair.  Initially agreeable then would only perform bed mobility.  Declined oob.  Min/mod a for bed mobility.  Will continue to follow.   Follow Up Recommendations  Home health OT;Supervision/Assistance - 24 hour    Equipment Recommendations  None recommended by OT    Recommendations for Other Services      Precautions / Restrictions Precautions Precautions: None Restrictions Weight Bearing Restrictions: No       Mobility Bed Mobility Overal bed mobility: Needs Assistance             General bed mobility comments: pt. agreeable to bed mobility and repositioning in preparation for increasing ability to perform adls safely.  mod a to scoot up in bed. trouble shooted with pillow positioning for pt. comfort.  encouraged oob multiple times to review benefits esp. for eating.  pt. attempted but then declined mid movement  Transfers                      Balance                                           ADL either performed or assessed with clinical judgement   ADL Overall ADL's : Needs  assistance/impaired                                       General ADL Comments: pt. declining oob this day but did clarify she is using a walk in shower with a shower seat. states she was using a tub up until 3 months ago     Vision       Perception     Praxis      Cognition                                                Exercises     Shoulder Instructions       General Comments      Pertinent Vitals/ Pain       Pain Assessment: No/denies pain  Home Living                                          Prior Functioning/Environment  Frequency  Min 2X/week        Progress Toward Goals  OT Goals(current goals can now be found in the care plan section)  Progress towards OT goals: Progressing toward goals     Plan      Co-evaluation                 AM-PAC OT "6 Clicks" Daily Activity     Outcome Measure   Help from another person eating meals?: None Help from another person taking care of personal grooming?: A Little Help from another person toileting, which includes using toliet, bedpan, or urinal?: A Little Help from another person bathing (including washing, rinsing, drying)?: A Little Help from another person to put on and taking off regular upper body clothing?: None Help from another person to put on and taking off regular lower body clothing?: A Little 6 Click Score: 20    End of Session    OT Visit Diagnosis: Muscle weakness (generalized) (M62.81)   Activity Tolerance Other (comment)(states "im just not feeling well all over and dont want to do anything")   Patient Left in bed;with call bell/phone within reach   Nurse Communication          Time: 1150-1208 OT Time Calculation (min): 18 min  Charges: OT General Charges $OT Visit: 1 Visit OT Treatments $Self Care/Home Management : 8-22 mins   Janice Coffin, COTA/L 12/14/2018, 12:41 PM

## 2018-12-14 NOTE — Progress Notes (Signed)
Progress Note  Patient Name: Cassandra Ray Date of Encounter: 12/14/2018  Primary Cardiologist: Candee Furbish, MD   Subjective   Felt a little bit better yesterday evening, is disappointed this morning that her heart rate is not better.  No chest pain.  Has had longstanding fatigue but felt worse during this admission.  Inpatient Medications    Scheduled Meds: . acidophilus  1 capsule Oral Daily  . apixaban  2.5 mg Oral BID  . famotidine  20 mg Oral Daily  . furosemide  40 mg Intravenous Daily  . levothyroxine  100 mcg Oral QAC breakfast  . lipase/protease/amylase  72,000 Units Oral TID WC  . Melatonin  6 mg Oral QHS  . mometasone-formoterol  2 puff Inhalation BID  . pantoprazole  40 mg Oral Daily  . potassium chloride  40 mEq Oral Once  . sodium chloride flush  3 mL Intravenous Q12H  . sodium chloride flush  3 mL Intravenous Q12H   Continuous Infusions: . sodium chloride    . sodium chloride    . diltiazem (CARDIZEM) infusion 15 mg/hr (12/14/18 0740)  . sodium chloride     PRN Meds: sodium chloride, sodium chloride, acetaminophen, loperamide, ondansetron (ZOFRAN) IV, sodium chloride flush, sodium chloride flush   Vital Signs    Vitals:   12/13/18 1619 12/13/18 2049 12/13/18 2050 12/14/18 0444  BP: 99/65 116/62 116/62 (!) 110/95  Pulse: 84 (!) 103 89 (!) 121  Resp: 20     Temp: (!) 97.4 F (36.3 C) 97.9 F (36.6 C) 97.9 F (36.6 C) 97.6 F (36.4 C)  TempSrc: Oral Oral Oral Oral  SpO2: 98% 98% 93% 95%  Weight:    77.3 kg  Height:        Intake/Output Summary (Last 24 hours) at 12/14/2018 0833 Last data filed at 12/14/2018 0831 Gross per 24 hour  Intake -  Output 950 ml  Net -950 ml   Last 3 Weights 12/14/2018 12/13/2018 12/12/2018  Weight (lbs) 170 lb 6.7 oz 172 lb 9.9 oz 173 lb 12.8 oz  Weight (kg) 77.3 kg 78.3 kg 78.835 kg      Telemetry    120s to 140s A. fib- Personally Reviewed  ECG    A. fib RVR- Personally Reviewed  Physical Exam   GEN:  No acute distress.  Elderly Neck: No JVD Cardiac: RRR, no murmurs, rubs, or gallops.  Respiratory:  Minimal wheeze bilaterally. GI: Soft, nontender, non-distended  MS: No edema; No deformity. Neuro:  Nonfocal  Psych: Normal affect   Labs    High Sensitivity Troponin:   Recent Labs  Lab 12/12/18 1225 12/12/18 1610  TROPONINIHS 67* 60*      Cardiac EnzymesNo results for input(s): TROPONINI in the last 168 hours. No results for input(s): TROPIPOC in the last 168 hours.   Chemistry Recent Labs  Lab 12/12/18 1225 12/13/18 0500 12/14/18 0344  NA 136 140 136  K 4.0 3.4* 3.6  CL 101 104 102  CO2 22 22 19*  GLUCOSE 98 101* 111*  BUN 46* 48* 52*  CREATININE 2.26* 2.20* 2.14*  CALCIUM 9.1 8.7* 8.6*  PROT 6.4*  --   --   ALBUMIN 3.1*  --   --   AST 31  --   --   ALT 21  --   --   ALKPHOS 74  --   --   BILITOT 1.1  --   --   GFRNONAA 18* 19* 19*  GFRAA 21* 22* 22*  ANIONGAP 13 14 15      Hematology Recent Labs  Lab 12/12/18 1225 12/13/18 0500 12/14/18 0344  WBC 13.0* 10.5 10.1  RBC 4.10 3.82* 3.66*  HGB 13.0 12.1 11.7*  HCT 38.6 35.7* 34.3*  MCV 94.1 93.5 93.7  MCH 31.7 31.7 32.0  MCHC 33.7 33.9 34.1  RDW 13.2 13.3 13.2  PLT 175 159 160    BNP Recent Labs  Lab 12/12/18 1225  BNP 899.9*     DDimer No results for input(s): DDIMER in the last 168 hours.   Radiology    US Renal  Result Date: 12/13/2018 CLINICAL DATA:  Acute renal failure. EXAM: RENAL / URINARY TRACT ULTRASOUND COMPLETE COMPARISON:  Ultrasound 12/11/2014 FINDINGS: Right Kidney: Renal measurements: 8.6 x 4.9 x 4.8 cm = volume: 105 mL. Normal echogenicity. Cortical thinning throughout the right kidney without hydronephrosis. No suspicious renal lesion. Left Kidney: Renal measurements: 8.6 x 4.4 x 4.7 cm = volume: 92 mL. Normal echogenicity. Cortical thinning without hydronephrosis. No suspicious renal lesion. Bladder: Appears normal for degree of bladder distention. IMPRESSION: 1. Negative for  hydronephrosis. 2. Cortical thinning in both kidneys. Electronically Signed   By: Markus Daft M.D.   On: 12/13/2018 12:15   Dg Chest Port 1 View  Result Date: 12/13/2018 CLINICAL DATA:  Shortness of breath EXAM: PORTABLE CHEST 1 VIEW COMPARISON:  December 12, 2018. FINDINGS: There is no appreciable edema or consolidation. There is slight left base atelectasis. Heart is mildly enlarged with pulmonary vascularity normal. No adenopathy. There is aortic atherosclerosis. Bones are osteoporotic. IMPRESSION: Mild left base atelectasis. No edema or consolidation. Heart mildly enlarged. Aortic Atherosclerosis (ICD10-I70.0). Bones osteoporotic. Electronically Signed   By: Lowella Grip III M.D.   On: 12/13/2018 08:59   Dg Chest Port 1 View  Result Date: 12/12/2018 CLINICAL DATA:  Rapid and irregular heart rate EXAM: PORTABLE CHEST 1 VIEW COMPARISON:  Chest x-rays dated 05/23/2018 and 02/01/2016. FINDINGS: Heart size and mediastinal contours are stable. Central parabronchial thickening/edema bilaterally. Probable cephalization. No confluent opacity to suggest consolidating pneumonia. No pleural effusion or pneumothorax seen. Osseous structures about the chest are unremarkable. IMPRESSION: Acute bronchitic change. This could represent mild CHF or atypical/viral pneumonia. No evidence of consolidating pneumonia or overt alveolar pulmonary edema. Electronically Signed   By: Franki Cabot M.D.   On: 12/12/2018 13:50    Cardiac Studies   Echo EF normal  Patient Profile     85 y.o. female with the following issues:  Assessment & Plan    Paroxysmal atrial fibrillation with rapid ventricular response/acute diastolic heart failure -Still not rate controlled. -Diltiazem at 15 IV - I will start amiodarone 400 mg twice a day load to help with rate control also to hopefully help with rhythm control in the future.  Watch for nausea.  Reduce dose if that is the case. -Continue with current IV Lasix dose.  Creatinine  stable, lungs sound better  Chronic anticoagulation -New start apixaban 2.5 mg twice a day      For questions or updates, please contact [place] Please consult [email or web address] for contact info under        Signed, Candee Furbish, MD  12/14/2018, 8:33 AM

## 2018-12-14 NOTE — Evaluation (Signed)
Clinical/Bedside Swallow Evaluation Patient Details  Name: Cassandra Ray MRN: [id number] Date of Birth: Dec 05, 1922  Today's Date: 12/14/2018 Time: SLP Start Time (ACUTE ONLY): 1016 SLP Stop Time (ACUTE ONLY): 1035 SLP Time Calculation (min) (ACUTE ONLY): 19 min  Past Medical History:  Past Medical History:  Diagnosis Date  . Actinic keratoses   . Arthritis    back   . Atrial fibrillation ([place])    a. questionable episode in 2015 in [facility]. b. event monitor through December/January 2014/2015 which showed no evidence of atrial fibrillation.  . Breast cancer ([place])    Breast cancer (right)  . Chronic diastolic CHF (congestive heart failure) ([place])   . Compression fracture of L3 lumbar vertebra   . Dysphagia   . GERD (gastroesophageal reflux disease)    takes otc Pepcid as needed  . Hiatal hernia 11/27/2014  . HTN (hypertension)   . Hypothyroidism   . Insomnia   . Osteopenia   . Pleural effusion    Past Surgical History:  Past Surgical History:  Procedure Laterality Date  . BREAST LUMPECTOMY Right 1995   lumpectomy  . COLONOSCOPY    . EYE SURGERY Bilateral    cataract w/ lens implant  . KYPHOPLASTY N/A 09/18/2013   Procedure: KYPHOPLASTY;  Surgeon: Sinclair Ship, MD;  Location: [facility];  Service: Orthopedics;  Laterality: N/A;  Lumbar 3 kyphoplasty  . TONSILLECTOMY     HPI:  Patient is a pleasant 85 year old female medical history significant for questionable A. fib in 2015, hypertension, hypothyroidism, chronic diastolic heart failure, pulmonary hypertension, gastroesophageal reflux disease, history of right breast cancer status post lumpectomy presented to the ED from independent living facility with generalized weakness and noted to be in A. fib with RVR. Chest x-ray concerning for possible CHF versus atypical/viral pneumonia. Patient with h/o dysphagia since at least 2017 with esophageal and mild pharyngeal components. Chin tuck posture prevented aspiration during most  recent study 09/14/2017. Recommendations at that time were for dysphagia 3 solids, thin liquid with use of chin tuck and double swallow. Discharged from [facility] services 12/2017 mod I with swallowing precuations/strategies.    Assessment / Plan / Recommendation Clinical Impression  Bedside swallow evaluation results consistent with those of previous swallow evaluations and notes from previous OP SLP treatment. Patient without overt indication of aspiration with regular texture solids or thin liquids however did require moderate-max cueing to recall use of chin tuck and double swallow as previously taught to decrease known aspiration risk based on history. At this time, would not recommend adjustments to diet however would recommend brief SLP f/u for use of swallowing precautions and family education as patient will be at risk for aspiration long term given chronic nature of dysphagia. Patient verbalized understanding. Will f/u.  SLP Visit Diagnosis: Dysphagia, pharyngoesophageal phase (R13.14)    Aspiration Risk  Mild aspiration risk    Diet Recommendation Dysphagia 3 (Mech soft);Thin liquid(per previous recommendations and home diet)   Liquid Administration via: Cup;Straw Medication Administration: Whole meds with liquid Supervision: Patient able to self feed;Intermittent supervision to cue for compensatory strategies Compensations: Slow rate;Small sips/bites;Multiple dry swallows after each bite/sip;Chin tuck Postural Changes: Seated upright at 90 degrees;Remain upright for at least 30 minutes after po intake    Other  Recommendations Oral Care Recommendations: Oral care BID   Follow up Recommendations None      Frequency and Duration min 1 x/week  1 week           Swallow Study  General HPI: Patient is a pleasant 85 year old female medical history significant for questionable A. fib in 2015, hypertension, hypothyroidism, chronic diastolic heart failure, pulmonary hypertension,  gastroesophageal reflux disease, history of right breast cancer status post lumpectomy presented to the ED from independent living facility with generalized weakness and noted to be in A. fib with RVR. Chest x-ray concerning for possible CHF versus atypical/viral pneumonia. Patient with h/o dysphagia since at least 2017 with esophageal and mild pharyngeal components. Chin tuck posture prevented aspiration during most recent study 09/14/2017. Recommendations at that time were for dysphagia 3 solids, thin liquid with use of chin tuck and double swallow. Discharged from [facility] services 12/2017 mod I with swallowing precuations/strategies.  Type of Study: Bedside Swallow Evaluation Previous Swallow Assessment: see HPI Diet Prior to this Study: Regular;Thin liquids Temperature Spikes Noted: No Respiratory Status: Room air History of Recent Intubation: No Behavior/Cognition: Alert;Cooperative;Pleasant mood Oral Cavity Assessment: Within Functional Limits Oral Care Completed by SLP: No Oral Cavity - Dentition: Adequate natural dentition Vision: Functional for self-feeding Self-Feeding Abilities: Able to feed self Patient Positioning: Upright in bed Baseline Vocal Quality: Normal Volitional Cough: Strong Volitional Swallow: Able to elicit    Oral/Motor/Sensory Function Overall Oral Motor/Sensory Function: Within functional limits   Ice Chips Ice chips: Not tested   Thin Liquid Thin Liquid: Within functional limits Presentation: Cup;Self Fed;Straw    Nectar Thick Nectar Thick Liquid: Not tested   Honey Thick Honey Thick Liquid: Not tested   Puree Puree: Within functional limits Presentation: Spoon;Self Fed   Solid     Solid: Within functional limits Presentation: Self Fed     Avion Kutzer MA, CCC-SLP   [name] 12/14/2018,10:41 AM

## 2018-12-14 NOTE — Progress Notes (Addendum)
PROGRESS NOTE    Cassandra Ray  DPO:[id number] DOB: [birth year] DOA: 12/12/2018 PCP: Lawerance Cruel, MD   Brief Narrative:  Patient is a pleasant 85 year old female medical history significant for questionable A. fib in 2015, hypertension, hypothyroidism, chronic diastolic heart failure, pulmonary hypertension, gastroesophageal reflux disease, history of right breast cancer status post lumpectomy presented to the ED from independent living facility with generalized weakness and noted to be in A. fib with RVR.  Patient admitted with A. fib with RVR placed on a Cardizem drip and also concern for heart failure.  Patient also noted to be in acute on chronic kidney disease stage III.  Cardiology consulted and are following.   Assessment & Plan:   Principal Problem:   New onset a-fib ([place]) Active Problems:   Acute on chronic diastolic CHF (congestive heart failure) (HCC)   HTN (hypertension)   Hypothyroidism   Pleural effusion, right   Acute kidney injury superimposed on chronic kidney disease (HCC)   Leukocytosis   Atrial fibrillation with RVR ([place])  1 new onset atrial fibrillation Patient noted in the past have a questionable history of A. fib however does not hold a diagnosis of A. fib. CHA2DS2VASC score > 4.  Questionable etiology.  Patient denies any chest pain.  Patient with concerns for acute on chronic diastolic CHF.  Cardiac enzymes elevated but plateaued and likely had demand ischemia.  Patient presented to the ED with generalized weakness and lethargy noted to be in new onset A. fib with RVR with heart rates in the 130s.  Patient received 2 pushes of IV Cardizem with some initial improvement with heart rate however heart rate noted to be back in the 130s and fluctuating and as such patient started on a Cardizem drip.  Patient currently on Cardizem drip.  Blood pressure borderline.  Patient with heart rates ranging in the 120s.  Improvement with shortness of breath however not at  baseline.  Continue Cardizem drip for now.  Monitor closely with diuresis.??  Cardioversion however will defer to cardiology.  2D echo with EF of 60 to 65%, mildly increased left ventricular wall thickness, trivial pericardial effusion present, mildly thickening of mitral valve leaflet, normal tricuspid valve, tricuspid aortic valve. Patient seen in consultation by cardiology who are recommending anticoagulation and patient has been started on Eliquis.  Cardiology following and appreciate input and recommendations.  2.  Acute on chronic diastolic heart failure Likely secondary to problem #1.  Patient noted to have an elevated BNP of 899.9.  Patient with some complaints of some intermittent shortness of breath.  Physical exam with some bibasilar and scattered crackles noted.  Chest x-ray concerning for possible CHF versus atypical/viral pneumonia.  Patient afebrile.  Patient given a dose of Lasix 40 mg IV x1 in the ED.  Cardiac enzymes elevated but plateaued likely demand ischemia.    Patient with borderline blood pressure.  Urine output of 950 cc over the past 24 hours.  Current weight is 170.42 pounds from 172.62 pounds from 173.8 pounds on admission.  Continue Lasix 40 mg IV daily, strict I's and O's, daily weights.  Patient on Cardizem drip.  Continue to hold beta-blocker for now.  Cardiology following.   3.  Hypothyroidism TSH at 0.147.    Free T4 is 2.10.  Decreased home dose Synthroid to 100 MCG's daily.  Will need repeat thyroid function studies done in the outpatient setting in 4 to 6 weeks.    4.  Acute kidney injury on chronic kidney  disease stage III Patient with a creatinine of 2.26 on admission last creatinine was 1.49 on May 25, 2018.  Likely prerenal azotemia secondary to acute CHF exacerbation, in the setting of ARB.    Creatinine with no significant change currently at 2.14.  Urine sodium of 87.  Urine creatinine of 24.96.  Fractional excretion of sodium is 5.79.  Renal ultrasound  done showed bilateral cortical thinning however no evidence of hydronephrosis.  400 cc UOP over the past 24 hours.  Continue to hold ARB.  Continue IV Lasix and monitor renal function closely.  If worsening renal function will need to consult with nephrology for further evaluation and management.  Follow for now.   5.  Leukocytosis Likely reactive leukocytosis.  Patient afebrile.  Chest x-ray with no acute infiltrate noted.    Urinalysis nitrite negative, leukocytes negative.  WBC trending down.  No need for antibiotics at this time.  Follow.   6.  Gastroesophageal reflux disease Continue Pepcid.  7.  Weakness/fatigue Likely secondary to problems #1 and 2.  PT/OT.  8.  Hypokalemia Potassium repleted currently at 3.6.  Follow.    DVT prophylaxis: Eliquis Code Status: DNR Family Communication: Updated patient.  Updated husband on telephone. Disposition Plan: Home once A. fib is resolved and per cardiology.   Consultants:   Cardiology: Dr. [name] 12/12/2018  Procedures:   2D echo 12/13/2018  Chest x-ray 12/13/2018  Renal ultrasound 12/13/2018  Antimicrobials:  None   Subjective: Patient is lying on the side of the bed asking for help with her breakfast.  States she is feeling somewhat better in terms of her shortness of breath.  Denies any chest pain.  Asking whether her room has the bathroom.  States she has been asking for help for over an hour.    Objective: Vitals:   12/13/18 1619 12/13/18 2049 12/13/18 2050 12/14/18 0444  BP: 99/65 116/62 116/62 (!) 110/95  Pulse: 84 (!) 103 89 (!) 121  Resp: 20     Temp: (!) 97.4 F (36.3 C) 97.9 F (36.6 C) 97.9 F (36.6 C) 97.6 F (36.4 C)  TempSrc: Oral Oral Oral Oral  SpO2: 98% 98% 93% 95%  Weight:    77.3 kg  Height:       No intake or output data in the 24 hours ending 12/14/18 0816 Filed Weights   12/12/18 1530 12/13/18 0500 12/14/18 0444  Weight: 78.8 kg 78.3 kg 77.3 kg    Examination:  General exam:  NAD Respiratory system: Scattered minimal wheezing. No rhonchi.  V Cardiovascular system: irregularly irregular. No JVD, murmurs, rubs, gallops or clicks. No pedal edema. Gastrointestinal system: Abdomen is soft, nontender, nondistended, positive bowel sounds.  No rebound.  No guarding.  Central nervous system: Alert and oriented. No focal neurological deficits. Extremities: Symmetric 5 x 5 power. Skin: No rashes, lesions or ulcers Psychiatry: Judgement and insight appear normal. Mood & affect appropriate.     Data Reviewed: I have personally reviewed following labs and imaging studies  CBC: Recent Labs  Lab 12/12/18 1225 12/13/18 0500 12/14/18 0344  WBC 13.0* 10.5 10.1  NEUTROABS 8.6*  --   --   HGB 13.0 12.1 11.7*  HCT 38.6 35.7* 34.3*  MCV 94.1 93.5 93.7  PLT 175 159 588   Basic Metabolic Panel: Recent Labs  Lab 12/12/18 1225 12/13/18 0500 12/14/18 0344  NA 136 140 136  K 4.0 3.4* 3.6  CL 101 104 102  CO2 22 22 19*  GLUCOSE 98 101* 111*  BUN 46* 48* 52*  CREATININE 2.26* 2.20* 2.14*  CALCIUM 9.1 8.7* 8.6*  MG  --  2.0 2.1   GFR: Estimated Creatinine Clearance: 16.5 mL/min (A) (by C-G formula based on SCr of 2.14 mg/dL (H)). Liver Function Tests: Recent Labs  Lab 12/12/18 1225  AST 31  ALT 21  ALKPHOS 74  BILITOT 1.1  PROT 6.4*  ALBUMIN 3.1*   No results for input(s): LIPASE, AMYLASE in the last 168 hours. No results for input(s): AMMONIA in the last 168 hours. Coagulation Profile: No results for input(s): INR, PROTIME in the last 168 hours. Cardiac Enzymes: No results for input(s): CKTOTAL, CKMB, CKMBINDEX, TROPONINI in the last 168 hours. BNP (last 3 results) No results for input(s): PROBNP in the last 8760 hours. HbA1C: No results for input(s): HGBA1C in the last 72 hours. CBG: No results for input(s): GLUCAP in the last 168 hours. Lipid Profile: Recent Labs    12/13/18 0500  CHOL 128  HDL 79  LDLCALC 36  TRIG 63  CHOLHDL 1.6   Thyroid  Function Tests: Recent Labs    12/12/18 1330  12/12/18 1653 12/13/18 0500  TSH 0.147*  --   --   --   FREET4  --    < > 2.10* 1.57*  T3FREE  --   --  1.9*  --    < > = values in this interval not displayed.   Anemia Panel: No results for input(s): VITAMINB12, FOLATE, FERRITIN, TIBC, IRON, RETICCTPCT in the last 72 hours. Sepsis Labs: No results for input(s): PROCALCITON, LATICACIDVEN in the last 168 hours.  Recent Results (from the past 240 hour(s))  SARS Coronavirus 2 (CEPHEID - Performed in [facility] hospital lab), Hosp Order     Status: None   Collection Time: 12/12/18  1:49 PM   Specimen: Nasopharyngeal Swab  Result Value Ref Range Status   SARS Coronavirus 2 NEGATIVE NEGATIVE Final    Comment: (NOTE) If result is NEGATIVE SARS-CoV-2 target nucleic acids are NOT DETECTED. The SARS-CoV-2 RNA is generally detectable in upper and lower  respiratory specimens during the acute phase of infection. The lowest  concentration of SARS-CoV-2 viral copies this assay can detect is 250  copies / mL. A negative result does not preclude SARS-CoV-2 infection  and should not be used as the sole basis for treatment or other  patient management decisions.  A negative result may occur with  improper specimen collection / handling, submission of specimen other  than nasopharyngeal swab, presence of viral mutation(s) within the  areas targeted by this assay, and inadequate number of viral copies  (<250 copies / mL). A negative result must be combined with clinical  observations, patient history, and epidemiological information. If result is POSITIVE SARS-CoV-2 target nucleic acids are DETECTED. The SARS-CoV-2 RNA is generally detectable in upper and lower  respiratory specimens dur ing the acute phase of infection.  Positive  results are indicative of active infection with SARS-CoV-2.  Clinical  correlation with patient history and other diagnostic information is  necessary to determine  patient infection status.  Positive results do  not rule out bacterial infection or co-infection with other viruses. If result is PRESUMPTIVE POSTIVE SARS-CoV-2 nucleic acids MAY BE PRESENT.   A presumptive positive result was obtained on the submitted specimen  and confirmed on repeat testing.  While 2019 novel coronavirus  (SARS-CoV-2) nucleic acids may be present in the submitted sample  additional confirmatory testing may be necessary for epidemiological  and /  or clinical management purposes  to differentiate between  SARS-CoV-2 and other Sarbecovirus currently known to infect humans.  If clinically indicated additional testing with an alternate test  methodology [id number]) is advised. The SARS-CoV-2 RNA is generally  detectable in upper and lower respiratory sp ecimens during the acute  phase of infection. The expected result is Negative. Fact Sheet for Patients:  StrictlyIdeas.no Fact Sheet for Healthcare Providers: BankingDealers.co.za This test is not yet approved or cleared by the [place] FDA and has been authorized for detection and/or diagnosis of SARS-CoV-2 by FDA under an Emergency Use Authorization (EUA).  This EUA will remain in effect (meaning this test can be used) for the duration of the COVID-19 declaration under Section 564(b)(1) of the Act, 21 U.S.C. section 360bbb-3(b)(1), unless the authorization is terminated or revoked sooner. Performed at [facility], [place] [address], [place], [place] [address]   Urine Culture     Status: Abnormal   Collection Time: 12/12/18  8:20 PM   Specimen: Urine, Clean Catch  Result Value Ref Range Status   Specimen Description URINE, CLEAN CATCH  Final   Special Requests   Final    NONE Performed at [facility], [place] [address], [place], [place] [address]    Culture MULTIPLE SPECIES PRESENT, SUGGEST RECOLLECTION (A)  Final   Report Status 12/13/2018 FINAL  Final   MRSA PCR Screening     Status: None   Collection Time: 12/12/18  8:42 PM   Specimen: Nasal Mucosa; Nasopharyngeal  Result Value Ref Range Status   MRSA by PCR NEGATIVE NEGATIVE Final    Comment:        The GeneXpert MRSA Assay (FDA approved for NASAL specimens only), is one component of a comprehensive MRSA colonization surveillance program. It is not intended to diagnose MRSA infection nor to guide or monitor treatment for MRSA infections. Performed at [facility], [place] [address], [place], [place] [address]          Radiology Studies: US Renal  Result Date: 12/13/2018 CLINICAL DATA:  Acute renal failure. EXAM: RENAL / URINARY TRACT ULTRASOUND COMPLETE COMPARISON:  Ultrasound 12/11/2014 FINDINGS: Right Kidney: Renal measurements: 8.6 x 4.9 x 4.8 cm = volume: 105 mL. Normal echogenicity. Cortical thinning throughout the right kidney without hydronephrosis. No suspicious renal lesion. Left Kidney: Renal measurements: 8.6 x 4.4 x 4.7 cm = volume: 92 mL. Normal echogenicity. Cortical thinning without hydronephrosis. No suspicious renal lesion. Bladder: Appears normal for degree of bladder distention. IMPRESSION: 1. Negative for hydronephrosis. 2. Cortical thinning in both kidneys. Electronically Signed   By: Markus Daft M.D.   On: 12/13/2018 12:15   Dg Chest Port 1 View  Result Date: 12/13/2018 CLINICAL DATA:  Shortness of breath EXAM: PORTABLE CHEST 1 VIEW COMPARISON:  December 12, 2018. FINDINGS: There is no appreciable edema or consolidation. There is slight left base atelectasis. Heart is mildly enlarged with pulmonary vascularity normal. No adenopathy. There is aortic atherosclerosis. Bones are osteoporotic. IMPRESSION: Mild left base atelectasis. No edema or consolidation. Heart mildly enlarged. Aortic Atherosclerosis (ICD10-I70.0). Bones osteoporotic. Electronically Signed   By: Lowella Grip III M.D.   On: 12/13/2018 08:59   Dg Chest Port 1 View  Result Date:  12/12/2018 CLINICAL DATA:  Rapid and irregular heart rate EXAM: PORTABLE CHEST 1 VIEW COMPARISON:  Chest x-rays dated 05/23/2018 and 02/01/2016. FINDINGS: Heart size and mediastinal contours are stable. Central parabronchial thickening/edema bilaterally. Probable cephalization. No confluent opacity to suggest consolidating pneumonia.  No pleural effusion or pneumothorax seen. Osseous structures about the chest are unremarkable. IMPRESSION: Acute bronchitic change. This could represent mild CHF or atypical/viral pneumonia. No evidence of consolidating pneumonia or overt alveolar pulmonary edema. Electronically Signed   By: Franki Cabot M.D.   On: 12/12/2018 13:50        Scheduled Meds:  acidophilus  1 capsule Oral Daily   apixaban  2.5 mg Oral BID   famotidine  20 mg Oral Daily   furosemide  40 mg Intravenous Daily   levothyroxine  100 mcg Oral QAC breakfast   lipase/protease/amylase  72,000 Units Oral TID WC   Melatonin  6 mg Oral QHS   mometasone-formoterol  2 puff Inhalation BID   pantoprazole  40 mg Oral Daily   potassium chloride  40 mEq Oral Once   sodium chloride flush  3 mL Intravenous Q12H   sodium chloride flush  3 mL Intravenous Q12H   Continuous Infusions:  sodium chloride     sodium chloride     diltiazem (CARDIZEM) infusion 15 mg/hr (12/14/18 0740)   sodium chloride       LOS: 1 day    Time spent: 40 minutes    Irine Seal, MD [facility]  If 7PM-7AM, please contact night-coverage [email or web address] 12/14/2018, 8:16 AM

## 2018-12-15 LAB — CBC
HCT: 32 % — ABNORMAL LOW (ref 36.0–46.0)
Hemoglobin: 10.8 g/dL — ABNORMAL LOW (ref 12.0–15.0)
MCH: 31.5 pg (ref 26.0–34.0)
MCHC: 33.8 g/dL (ref 30.0–36.0)
MCV: 93.3 fL (ref 80.0–100.0)
Platelets: 147 10*3/uL — ABNORMAL LOW (ref 150–400)
RBC: 3.43 MIL/uL — ABNORMAL LOW (ref 3.87–5.11)
RDW: 13.5 % (ref 11.5–15.5)
WBC: 11.3 10*3/uL — ABNORMAL HIGH (ref 4.0–10.5)
nRBC: 0 % (ref 0.0–0.2)

## 2018-12-15 LAB — BASIC METABOLIC PANEL
Anion gap: 10 (ref 5–15)
BUN: 54 mg/dL — ABNORMAL HIGH (ref 8–23)
CO2: 21 mmol/L — ABNORMAL LOW (ref 22–32)
Calcium: 8.8 mg/dL — ABNORMAL LOW (ref 8.9–10.3)
Chloride: 104 mmol/L (ref 98–111)
Creatinine, Ser: 2.31 mg/dL — ABNORMAL HIGH (ref 0.44–1.00)
GFR calc Af Amer: 20 mL/min — ABNORMAL LOW (ref >60)
GFR calc non Af Amer: 18 mL/min — ABNORMAL LOW (ref >60)
Glucose, Bld: 117 mg/dL — ABNORMAL HIGH (ref 70–99)
Potassium: 4.1 mmol/L (ref 3.5–5.1)
Sodium: 135 mmol/L (ref 135–145)

## 2018-12-15 MED ORDER — METOPROLOL TARTRATE 25 MG PO TABS
25.0000 mg | ORAL_TABLET | Freq: Two times a day (BID) | ORAL | Status: DC
  Administered 2018-12-15 – 2018-12-17 (×5): 25 mg via ORAL
  Filled 2018-12-15 (×4): qty 1

## 2018-12-15 MED ORDER — FUROSEMIDE 40 MG PO TABS
40.0000 mg | ORAL_TABLET | Freq: Every day | ORAL | Status: DC
  Administered 2018-12-16 – 2018-12-17 (×2): 40 mg via ORAL
  Filled 2018-12-15 (×2): qty 1

## 2018-12-15 MED ORDER — DIPHENHYDRAMINE HCL 12.5 MG/5ML PO ELIX: 12.5000 mg | ORAL_SOLUTION | Freq: Once | ORAL | Status: DC | PRN

## 2018-12-15 NOTE — Progress Notes (Signed)
Call placed to Cardiology since I'm unable to get HR down and maxed out on Cardizem drip, gave her Amiodarone 400 mg po earlier with very little results. Dr Rhae Hammock called back and was given all her VS, labs nd an update, no new orders received at this time, will continue to monitor,

## 2018-12-15 NOTE — Progress Notes (Signed)
PROGRESS NOTE    Cassandra Ray  STM:[id number] DOB: [birth year] DOA: 12/12/2018 PCP: Lawerance Cruel, MD   Brief Narrative:  Patient is a pleasant 85 year old female medical history significant for questionable A. fib in 2015, hypertension, hypothyroidism, chronic diastolic heart failure, pulmonary hypertension, gastroesophageal reflux disease, history of right breast cancer status post lumpectomy presented to the ED from independent living facility with generalized weakness and noted to be in A. fib with RVR.  Patient admitted with A. fib with RVR placed on a Cardizem drip and also concern for heart failure.  Patient also noted to be in acute on chronic kidney disease stage III.  Cardiology consulted and are following.   Assessment & Plan:   Principal Problem:   New onset a-fib ([place]) Active Problems:   Acute on chronic diastolic CHF (congestive heart failure) (HCC)   HTN (hypertension)   Hypothyroidism   Pleural effusion, right   Acute kidney injury superimposed on chronic kidney disease (HCC)   Leukocytosis   Atrial fibrillation with RVR ([place])  1 new onset atrial fibrillation/paroxysmal A. fib with RVR Patient noted in the past have a questionable history of A. fib however does not hold a diagnosis of A. fib. CHA2DS2VASC score > 4.  Questionable etiology.  Patient denies any chest pain.  Patient with concerns for acute on chronic diastolic CHF.  Cardiac enzymes elevated but plateaued and likely had demand ischemia.  Patient presented to the ED with generalized weakness and lethargy noted to be in new onset A. fib with RVR with heart rates in the 130s.  Patient received 2 pushes of IV Cardizem with some initial improvement with heart rate however heart rate noted to be back in the 130s and fluctuating and as such patient started on a Cardizem drip.  Patient currently on Cardizem drip at max doses with borderline blood pressure however rate difficult to control and noted to be 150s  overnight.  Improvement with shortness of breath however not at baseline.  Continue Cardizem drip for now.  Monitor closely with diuresis.??  Cardioversion however will defer to cardiology.  2D echo with EF of 60 to 65%, mildly increased left ventricular wall thickness, trivial pericardial effusion present, mildly thickening of mitral valve leaflet, normal tricuspid valve, tricuspid aortic valve. Patient seen in consultation by cardiology who are recommending anticoagulation and patient has been started on Eliquis.  Amiodarone and Lopressor added to current regimen.  Per cardiology if still unsuccessful at controlling patient's rate will need to consider TEE cardioversion tomorrow. Cardiology following and appreciate input and recommendations.  2.  Acute on chronic diastolic heart failure Likely secondary to problem #1.  Patient noted to have an elevated BNP of 899.9.  Patient with some complaints of some intermittent shortness of breath.  Physical exam with some bibasilar and scattered crackles noted as well as on 12/14/2018.  Pulmonary exam with improvement today 12/15/2018.Marland Kitchen  Chest x-ray concerning for possible CHF versus atypical/viral pneumonia.  Patient afebrile.  Patient given a dose of Lasix 40 mg IV x1 in the ED.  Cardiac enzymes elevated but plateaued likely demand ischemia.    Patient with borderline blood pressure.  Urine output of 1275 cc over the past 24 hours.  Current weight is 172.62 pounds from 170.42 pounds from 172.62 pounds from 173.8 pounds on admission.  Patient with a bump in the creatinine.  Discontinue IV Lasix and transition to oral Lasix in the next 24 to 48 hours.  Strict I's and O's.  Daily weights.  Patient started back on Lopressor per cardiology.  Patient on Cardizem drip.  Cardiology following.    3.  Hypothyroidism TSH at 0.147.    Free T4 is 2.10.  Decreased home dose Synthroid to 100 MCG's daily.  Will need repeat thyroid function studies done in the outpatient setting in 4  to 6 weeks.    4.  Acute kidney injury on chronic kidney disease stage III Patient with a creatinine of 2.26 on admission last creatinine was 1.49 on May 25, 2018.  Likely prerenal azotemia secondary to acute CHF exacerbation, in the setting of ARB.    Creatinine trending back up and currently at 2.31.  Urine sodium of 87.  Urine creatinine of 24.96.  Fractional excretion of sodium is 5.79.  Renal ultrasound done showed bilateral cortical thinning however no evidence of hydronephrosis.  400 cc UOP over the past 24 hours.  Continue to hold ARB.  Will discontinue IV Lasix.  Could likely resume home dose oral Lasix tomorrow or the following day if no significant worsening of renal function.  Follow for now.   5.  Leukocytosis Likely reactive leukocytosis.  Patient afebrile.  Chest x-ray with no acute infiltrate noted.    Urinalysis nitrite negative, leukocytes negative.  WBC fluctuating.  No need for antibiotics at this time.  Follow.    6.  Gastroesophageal reflux disease Continue Pepcid.  7.  Weakness/fatigue Likely secondary to problems #1 and 2.  PT/OT.  8.  Hypokalemia Potassium repleted currently at 4.1.  Follow.    DVT prophylaxis: Eliquis Code Status: DNR Family Communication: Updated patient.   Disposition Plan: Home once A. fib is resolved and per cardiology.   Consultants:   Cardiology: Dr. [name] 12/12/2018  Procedures:   2D echo 12/13/2018  Chest x-ray 12/13/2018  Renal ultrasound 12/13/2018  Antimicrobials:  None   Subjective: Patient sitting up in bed.  States she is feeling better.  Denies any significant shortness of breath.  Patient noted overnight to have significant tachycardia with A. fib with heart rates in the 150s despite maximum dose IV Cardizem.    Objective: Vitals:   12/15/18 0839 12/15/18 0909 12/15/18 0958 12/15/18 0959  BP: (!) 100/56 (!) 106/51 (!) 106/51 108/65  Pulse: (!) 125 (!) 126 (!) 120   Resp: (!) 23   (!) 22  Temp:       TempSrc:      SpO2: 95% 93%    Weight:      Height:        Intake/Output Summary (Last 24 hours) at 12/15/2018 1149 Last data filed at 12/15/2018 0959 Gross per 24 hour  Intake 726.45 ml  Output 325 ml  Net 401.45 ml   Filed Weights   12/13/18 0500 12/14/18 0444 12/15/18 0541  Weight: 78.3 kg 77.3 kg 78.3 kg    Examination:  General exam: NAD Respiratory system: Lungs clear to auscultation bilaterally.  No wheezes, no crackles, no rhonchi.  Cardiovascular system: Irregularly irregular.  No JVD, no murmurs, no rubs, no gallops.  No lower extremity edema.   Gastrointestinal system: Abdomen is soft, nontender, nondistended, positive bowel sounds.  No rebound.  No guarding.  Central nervous system: Alert and oriented. No focal neurological deficits. Extremities: Symmetric 5 x 5 power. Skin: No rashes, lesions or ulcers Psychiatry: Judgement and insight appear normal. Mood & affect appropriate.     Data Reviewed: I have personally reviewed following labs and imaging studies  CBC: Recent Labs  Lab 12/12/18 1225 12/13/18 0500 12/14/18  0344 12/15/18 0350  WBC 13.0* 10.5 10.1 11.3*  NEUTROABS 8.6*  --   --   --   HGB 13.0 12.1 11.7* 10.8*  HCT 38.6 35.7* 34.3* 32.0*  MCV 94.1 93.5 93.7 93.3  PLT 175 159 160 681*   Basic Metabolic Panel: Recent Labs  Lab 12/12/18 1225 12/13/18 0500 12/14/18 0344 12/15/18 0350  NA 136 140 136 135  K 4.0 3.4* 3.6 4.1  CL 101 104 102 104  CO2 22 22 19* 21*  GLUCOSE 98 101* 111* 117*  BUN 46* 48* 52* 54*  CREATININE 2.26* 2.20* 2.14* 2.31*  CALCIUM 9.1 8.7* 8.6* 8.8*  MG  --  2.0 2.1  --    GFR: Estimated Creatinine Clearance: 15.4 mL/min (A) (by C-G formula based on SCr of 2.31 mg/dL (H)). Liver Function Tests: Recent Labs  Lab 12/12/18 1225  AST 31  ALT 21  ALKPHOS 74  BILITOT 1.1  PROT 6.4*  ALBUMIN 3.1*   No results for input(s): LIPASE, AMYLASE in the last 168 hours. No results for input(s): AMMONIA in the last 168  hours. Coagulation Profile: No results for input(s): INR, PROTIME in the last 168 hours. Cardiac Enzymes: No results for input(s): CKTOTAL, CKMB, CKMBINDEX, TROPONINI in the last 168 hours. BNP (last 3 results) No results for input(s): PROBNP in the last 8760 hours. HbA1C: No results for input(s): HGBA1C in the last 72 hours. CBG: No results for input(s): GLUCAP in the last 168 hours. Lipid Profile: Recent Labs    12/13/18 0500  CHOL 128  HDL 79  LDLCALC 36  TRIG 63  CHOLHDL 1.6   Thyroid Function Tests: Recent Labs    12/12/18 1330  12/12/18 1653 12/13/18 0500  TSH 0.147*  --   --   --   FREET4  --    < > 2.10* 1.57*  T3FREE  --   --  1.9*  --    < > = values in this interval not displayed.   Anemia Panel: No results for input(s): VITAMINB12, FOLATE, FERRITIN, TIBC, IRON, RETICCTPCT in the last 72 hours. Sepsis Labs: No results for input(s): PROCALCITON, LATICACIDVEN in the last 168 hours.  Recent Results (from the past 240 hour(s))  SARS Coronavirus 2 (CEPHEID - Performed in [place] hospital lab), Hosp Order     Status: None   Collection Time: 12/12/18  1:49 PM   Specimen: Nasopharyngeal Swab  Result Value Ref Range Status   SARS Coronavirus 2 NEGATIVE NEGATIVE Final    Comment: (NOTE) If result is NEGATIVE SARS-CoV-2 target nucleic acids are NOT DETECTED. The SARS-CoV-2 RNA is generally detectable in upper and lower  respiratory specimens during the acute phase of infection. The lowest  concentration of SARS-CoV-2 viral copies this assay can detect is 250  copies / mL. A negative result does not preclude SARS-CoV-2 infection  and should not be used as the sole basis for treatment or other  patient management decisions.  A negative result may occur with  improper specimen collection / handling, submission of specimen other  than nasopharyngeal swab, presence of viral mutation(s) within the  areas targeted by this assay, and inadequate number of viral copies   (<250 copies / mL). A negative result must be combined with clinical  observations, patient history, and epidemiological information. If result is POSITIVE SARS-CoV-2 target nucleic acids are DETECTED. The SARS-CoV-2 RNA is generally detectable in upper and lower  respiratory specimens dur ing the acute phase of infection.  Positive  results are indicative of active infection with SARS-CoV-2.  Clinical  correlation with patient history and other diagnostic information is  necessary to determine patient infection status.  Positive results do  not rule out bacterial infection or co-infection with other viruses. If result is PRESUMPTIVE POSTIVE SARS-CoV-2 nucleic acids MAY BE PRESENT.   A presumptive positive result was obtained on the submitted specimen  and confirmed on repeat testing.  While 2019 novel coronavirus  (SARS-CoV-2) nucleic acids may be present in the submitted sample  additional confirmatory testing may be necessary for epidemiological  and / or clinical management purposes  to differentiate between  SARS-CoV-2 and other Sarbecovirus currently known to infect humans.  If clinically indicated additional testing with an alternate test  methodology [phone]) is advised. The SARS-CoV-2 RNA is generally  detectable in upper and lower respiratory sp ecimens during the acute  phase of infection. The expected result is Negative. Fact Sheet for Patients:  StrictlyIdeas.no Fact Sheet for Healthcare Providers: BankingDealers.co.za This test is not yet approved or cleared by the [place] FDA and has been authorized for detection and/or diagnosis of SARS-CoV-2 by FDA under an Emergency Use Authorization (EUA).  This EUA will remain in effect (meaning this test can be used) for the duration of the COVID-19 declaration under Section 564(b)(1) of the Act, 21 U.S.C. section 360bbb-3(b)(1), unless the authorization is terminated or  revoked sooner. Performed at [facility], [place] [address] [place], [place] [address]   Urine Culture     Status: Abnormal   Collection Time: 12/12/18  8:20 PM   Specimen: Urine, Clean Catch  Result Value Ref Range Status   Specimen Description URINE, CLEAN CATCH  Final   Special Requests   Final    NONE Performed at [facility], [place] [address] [place], [place] [address]    Culture MULTIPLE SPECIES PRESENT, SUGGEST RECOLLECTION (A)  Final   Report Status 12/13/2018 FINAL  Final  MRSA PCR Screening     Status: None   Collection Time: 12/12/18  8:42 PM   Specimen: Nasal Mucosa; Nasopharyngeal  Result Value Ref Range Status   MRSA by PCR NEGATIVE NEGATIVE Final    Comment:        The GeneXpert MRSA Assay (FDA approved for NASAL specimens only), is one component of a comprehensive MRSA colonization surveillance program. It is not intended to diagnose MRSA infection nor to guide or monitor treatment for MRSA infections. Performed at [facility], [place] [address], [place], [place] [address]          Radiology Studies: US Renal  Result Date: 12/13/2018 CLINICAL DATA:  Acute renal failure. EXAM: RENAL / URINARY TRACT ULTRASOUND COMPLETE COMPARISON:  Ultrasound 12/11/2014 FINDINGS: Right Kidney: Renal measurements: 8.6 x 4.9 x 4.8 cm = volume: 105 mL. Normal echogenicity. Cortical thinning throughout the right kidney without hydronephrosis. No suspicious renal lesion. Left Kidney: Renal measurements: 8.6 x 4.4 x 4.7 cm = volume: 92 mL. Normal echogenicity. Cortical thinning without hydronephrosis. No suspicious renal lesion. Bladder: Appears normal for degree of bladder distention. IMPRESSION: 1. Negative for hydronephrosis. 2. Cortical thinning in both kidneys. Electronically Signed   By: Markus Daft M.D.   On: 12/13/2018 12:15        Scheduled Meds: . acidophilus  1 capsule Oral Daily  . amiodarone  400 mg Oral BID  . apixaban  2.5 mg Oral BID  .  bisacodyl  5 mg Oral Once  . famotidine  20 mg Oral Daily  . furosemide  40 mg Intravenous Daily  . levothyroxine  100 mcg Oral QAC breakfast  . lipase/protease/amylase  72,000 Units Oral TID WC  . Melatonin  6 mg Oral QHS  . metoprolol tartrate  25 mg Oral BID  . mometasone-formoterol  2 puff Inhalation BID  . pantoprazole  40 mg Oral Daily  . sodium chloride flush  3 mL Intravenous Q12H  . sodium chloride flush  3 mL Intravenous Q12H   Continuous Infusions: . sodium chloride    . sodium chloride    . diltiazem (CARDIZEM) infusion 15 mg/hr (12/15/18 0957)  . sodium chloride       LOS: 2 days    Time spent: 40 minutes    Irine Seal, MD [facility]  If 7PM-7AM, please contact night-coverage [email or web address] 12/15/2018, 11:49 AM

## 2018-12-15 NOTE — Progress Notes (Signed)
Progress Note  Patient Name: Cassandra Ray Date of Encounter: 12/15/2018  Primary Cardiologist: Candee Furbish, MD   Subjective   Still with mild increased work of breathing.  Seems happier today.  I talked to her husband Rush [place] on the phone as well.  Inpatient Medications    Scheduled Meds: . acidophilus  1 capsule Oral Daily  . amiodarone  400 mg Oral BID  . apixaban  2.5 mg Oral BID  . bisacodyl  5 mg Oral Once  . famotidine  20 mg Oral Daily  . furosemide  40 mg Intravenous Daily  . levothyroxine  100 mcg Oral QAC breakfast  . lipase/protease/amylase  72,000 Units Oral TID WC  . Melatonin  6 mg Oral QHS  . mometasone-formoterol  2 puff Inhalation BID  . pantoprazole  40 mg Oral Daily  . sodium chloride flush  3 mL Intravenous Q12H  . sodium chloride flush  3 mL Intravenous Q12H   Continuous Infusions: . sodium chloride    . sodium chloride    . diltiazem (CARDIZEM) infusion 12.5 mg/hr (12/15/18 0748)  . sodium chloride     PRN Meds: sodium chloride, sodium chloride, acetaminophen, ipratropium, levalbuterol, loperamide, ondansetron (ZOFRAN) IV, sodium chloride flush, sodium chloride flush   Vital Signs    Vitals:   12/15/18 0540 12/15/18 0541 12/15/18 0610 12/15/18 0823  BP: 108/62 108/62 100/63   Pulse: (!) 124  (!) 116   Resp: (!) 23  19   Temp:  98.3 F (36.8 C)    TempSrc:      SpO2: 96%  93% 95%  Weight:  78.3 kg    Height:        Intake/Output Summary (Last 24 hours) at 12/15/2018 7510 Last data filed at 12/15/2018 0600 Gross per 24 hour  Intake 723.45 ml  Output 325 ml  Net 398.45 ml   Last 3 Weights 12/15/2018 12/14/2018 12/13/2018  Weight (lbs) 172 lb 9.9 oz 170 lb 6.7 oz 172 lb 9.9 oz  Weight (kg) 78.3 kg 77.3 kg 78.3 kg      Telemetry    Still challenging to rate control heart rates in the 120s- Personally Reviewed  ECG    Atrial fibrillation- Personally Reviewed  Physical Exam   GEN: No acute distress.  Elderly Neck: No JVD  Cardiac:  Irregularly irregular, no murmurs, rubs, or gallops.  Respiratory: Clear to auscultation bilaterally. GI: Soft, nontender, non-distended  MS: No edema; No deformity. Neuro:  Nonfocal  Psych: Normal affect   Labs    High Sensitivity Troponin:   Recent Labs  Lab 12/12/18 1225 12/12/18 1610  TROPONINIHS 67* 60*      Cardiac EnzymesNo results for input(s): TROPONINI in the last 168 hours. No results for input(s): TROPIPOC in the last 168 hours.   Chemistry Recent Labs  Lab 12/12/18 1225 12/13/18 0500 12/14/18 0344 12/15/18 0350  NA 136 140 136 135  K 4.0 3.4* 3.6 4.1  CL 101 104 102 104  CO2 22 22 19* 21*  GLUCOSE 98 101* 111* 117*  BUN 46* 48* 52* 54*  CREATININE 2.26* 2.20* 2.14* 2.31*  CALCIUM 9.1 8.7* 8.6* 8.8*  PROT 6.4*  --   --   --   ALBUMIN 3.1*  --   --   --   AST 31  --   --   --   ALT 21  --   --   --   ALKPHOS 74  --   --   --  BILITOT 1.1  --   --   --   GFRNONAA 18* 19* 19* 18*  GFRAA 21* 22* 22* 20*  ANIONGAP 13 14 15 10      Hematology Recent Labs  Lab 12/13/18 0500 12/14/18 0344 12/15/18 0350  WBC 10.5 10.1 11.3*  RBC 3.82* 3.66* 3.43*  HGB 12.1 11.7* 10.8*  HCT 35.7* 34.3* 32.0*  MCV 93.5 93.7 93.3  MCH 31.7 32.0 31.5  MCHC 33.9 34.1 33.8  RDW 13.3 13.2 13.5  PLT 159 160 147*    BNP Recent Labs  Lab 12/12/18 1225  BNP 899.9*     DDimer No results for input(s): DDIMER in the last 168 hours.   Radiology    US Renal  Result Date: 12/13/2018 CLINICAL DATA:  Acute renal failure. EXAM: RENAL / URINARY TRACT ULTRASOUND COMPLETE COMPARISON:  Ultrasound 12/11/2014 FINDINGS: Right Kidney: Renal measurements: 8.6 x 4.9 x 4.8 cm = volume: 105 mL. Normal echogenicity. Cortical thinning throughout the right kidney without hydronephrosis. No suspicious renal lesion. Left Kidney: Renal measurements: 8.6 x 4.4 x 4.7 cm = volume: 92 mL. Normal echogenicity. Cortical thinning without hydronephrosis. No suspicious renal lesion. Bladder:  Appears normal for degree of bladder distention. IMPRESSION: 1. Negative for hydronephrosis. 2. Cortical thinning in both kidneys. Electronically Signed   By: Markus Daft M.D.   On: 12/13/2018 12:15    Cardiac Studies   EF is normal on echocardiogram  Patient Profile     85 y.o. female atrial fibrillation with rapid ventricular response  Assessment & Plan    Paroxysmal atrial fibrillation with rapid ventricular response with acute diastolic heart failure - Very challenging to control her heart rate -Amiodarone 400 mg twice daily was started 12/14/2018 - IV Lasix administered - IV diltiazem maxed -I will add metoprolol 25 mg twice a day.  Watch BP. -If we are still unsuccessful at rate controlling, we could consider TEE cardioversion tomorrow.  Chronic anticoagulation -Eliquis  Hypothyroidism - Decreased Synthroid  Chronic kidney disease with acute kidney injury - Previous 1.5, currently 2.2    For questions or updates, please contact [place] Please consult [email or web address] for contact info under        Signed, Candee Furbish, MD  12/15/2018, 9:22 AM

## 2018-12-16 LAB — BASIC METABOLIC PANEL
Anion gap: 8 (ref 5–15)
BUN: 47 mg/dL — ABNORMAL HIGH (ref 8–23)
CO2: 23 mmol/L (ref 22–32)
Calcium: 8.7 mg/dL — ABNORMAL LOW (ref 8.9–10.3)
Chloride: 107 mmol/L (ref 98–111)
Creatinine, Ser: 2.12 mg/dL — ABNORMAL HIGH (ref 0.44–1.00)
GFR calc Af Amer: 23 mL/min — ABNORMAL LOW (ref >60)
GFR calc non Af Amer: 20 mL/min — ABNORMAL LOW (ref >60)
Glucose, Bld: 100 mg/dL — ABNORMAL HIGH (ref 70–99)
Potassium: 4.2 mmol/L (ref 3.5–5.1)
Sodium: 138 mmol/L (ref 135–145)

## 2018-12-16 LAB — CBC
HCT: 32.4 % — ABNORMAL LOW (ref 36.0–46.0)
Hemoglobin: 10.8 g/dL — ABNORMAL LOW (ref 12.0–15.0)
MCH: 31.4 pg (ref 26.0–34.0)
MCHC: 33.3 g/dL (ref 30.0–36.0)
MCV: 94.2 fL (ref 80.0–100.0)
Platelets: 147 10*3/uL — ABNORMAL LOW (ref 150–400)
RBC: 3.44 MIL/uL — ABNORMAL LOW (ref 3.87–5.11)
RDW: 13.8 % (ref 11.5–15.5)
WBC: 10.1 10*3/uL (ref 4.0–10.5)
nRBC: 0 % (ref 0.0–0.2)

## 2018-12-16 NOTE — Progress Notes (Signed)
Physical Therapy Treatment Patient Details Name: RUCHA WISSINGER MRN: [id number] DOB: [birth year] Today's Date: 12/16/2018    History of Present Illness 85 y.o. female with medical history significant of ??  History of A. fib in 2015, hypertension, hypothyroidism, chronic diastolic heart failure, pulmonary hypertension, gastroesophageal reflux disease, history of right breast cancer status post lumpectomy presented to the ED from independent living facility 2-day history of generalized weakness and lethargy per patient. Admitted for observation 12/12/18 of new onset of A-fib in the presence of Acute on chronic heart failure.     PT Comments    Pt was seen for therapy today, agreed to get up to the chair and in the process needed more help to stand today.  Pt was a bit fatigued and this may account for her difficulty in getting up from the bed.  Her plan is to continue to work toward home, and will add distances and challenges of balance for her as she can tolerate.  Has a husband at home who can help her.  Hr was not higher than 115 with all effort to move.     Follow Up Recommendations  Home health PT;Supervision/Assistance - 24 hour     Equipment Recommendations  None recommended by PT    Recommendations for Other Services       Precautions / Restrictions Precautions Precautions: Fall Restrictions Weight Bearing Restrictions: No    Mobility  Bed Mobility Overal bed mobility: Needs Assistance Bed Mobility: Supine to Sit     Supine to sit: Min assist     General bed mobility comments: pt requires help to scoot up to side of bed, with reminders for safety  Transfers Overall transfer level: Needs assistance Equipment used: Rolling walker (2 wheeled) Transfers: Sit to/from Stand Sit to Stand: Mod assist Stand pivot transfers: Min assist       General transfer comment: mod from bed with help to power up, needs min assist to capture static standing balance once  up  Ambulation/Gait Ambulation/Gait assistance: Min assist Gait Distance (Feet): 7 Feet Assistive device: Rolling walker (2 wheeled) Gait Pattern/deviations: Step-to pattern;Step-through pattern;Wide base of support;Trunk flexed;Decreased stride length Gait velocity: reduced Gait velocity interpretation: <1.8 ft/sec, indicate of risk for recurrent falls General Gait Details: HR was up to 106 standing, no excessive values today   Stairs             Wheelchair Mobility    Modified Rankin (Stroke Patients Only)       Balance Overall balance assessment: Needs assistance   Sitting balance-Leahy Scale: Fair     Standing balance support: Bilateral upper extremity supported;During functional activity Standing balance-Leahy Scale: Poor                              Cognition Arousal/Alertness: Awake/alert Behavior During Therapy: WFL for tasks assessed/performed Overall Cognitive Status: Impaired/Different from baseline Area of Impairment: Memory;Safety/judgement;Awareness;Problem solving                     Memory: Decreased recall of precautions;Decreased short-term memory   Safety/Judgement: Decreased awareness of safety;Decreased awareness of deficits Awareness: Intellectual Problem Solving: Slow processing;Requires verbal cues;Requires tactile cues General Comments: pt is slow to move, requires specific cues and instructions for standing and to get to side of be      Exercises      General Comments General comments (skin integrity, edema, etc.): pt is up to side of  bed with lines and RW managed by PT, prompting to step over to the chair and pt is comfortable with sitting in chair      Pertinent Vitals/Pain Pain Assessment: No/denies pain    Home Living                      Prior Function            PT Goals (current goals can now be found in the care plan section) Acute Rehab PT Goals Patient Stated Goal: get home to  husband Progress towards PT goals: Progressing toward goals    Frequency    Min 3X/week      PT Plan Current plan remains appropriate    Co-evaluation              AM-PAC PT "6 Clicks" Mobility   Outcome Measure  Help needed turning from your back to your side while in a flat bed without using bedrails?: A Little Help needed moving from lying on your back to sitting on the side of a flat bed without using bedrails?: A Little Help needed moving to and from a bed to a chair (including a wheelchair)?: A Little Help needed standing up from a chair using your arms (e.g., wheelchair or bedside chair)?: A Lot Help needed to walk in hospital room?: A Little Help needed climbing 3-5 steps with a railing? : A Lot 6 Click Score: 16    End of Session Equipment Utilized During Treatment: Gait belt Activity Tolerance: Patient limited by fatigue Patient left: in chair;with call bell/phone within reach;with chair alarm set Nurse Communication: Mobility status PT Visit Diagnosis: Other abnormalities of gait and mobility (R26.89);Muscle weakness (generalized) (M62.81);Difficulty in walking, not elsewhere classified (R26.2)     Time: 2549-8264 PT Time Calculation (min) (ACUTE ONLY): 29 min  Charges:  $Gait Training: 8-22 mins $Therapeutic Activity: 8-22 mins                    Ramond Dial 12/16/2018, 5:18 PM   Mee Hives, PT MS Acute Rehab Dept. Number: [place] [facility] [place]

## 2018-12-16 NOTE — Progress Notes (Signed)
Called Cardiology regarding patient's low B/P, stopped her Cardizem drip that was running at 5mg , attempted to turn down to 2.5mg  but pressure remains low, awaiting call back, VSS, urine outpt good and patient is A&O x 4, no other changes noted, will continue to monitor.

## 2018-12-16 NOTE — Progress Notes (Addendum)
Progress Note  Patient Name: Cassandra Ray Date of Encounter: 12/16/2018  Primary Cardiologist: Cassandra Furbish, MD  Subjective   It appears she converted overnight from some sort of persistent atrial tach to NSR (HR went from 105 -> 80s-90s) - appears abrupt change on telemetry around 0047 so likely a rhythm switch. Will obtain 12 lead to confirm.  Pt reports feeling much better. Less SOB. No edema. Reports mild occasional cough but only 2-3 x a day. Hopeful for DC soon.  Inpatient Medications    Scheduled Meds: . acidophilus  1 capsule Oral Daily  . amiodarone  400 mg Oral BID  . apixaban  2.5 mg Oral BID  . bisacodyl  5 mg Oral Once  . famotidine  20 mg Oral Daily  . furosemide  40 mg Oral Daily  . levothyroxine  100 mcg Oral QAC breakfast  . lipase/protease/amylase  72,000 Units Oral TID WC  . Melatonin  6 mg Oral QHS  . metoprolol tartrate  25 mg Oral BID  . mometasone-formoterol  2 puff Inhalation BID  . pantoprazole  40 mg Oral Daily  . sodium chloride flush  3 mL Intravenous Q12H  . sodium chloride flush  3 mL Intravenous Q12H   Continuous Infusions: . sodium chloride    . sodium chloride    . diltiazem (CARDIZEM) infusion Stopped (12/16/18 0545)  . sodium chloride     PRN Meds: sodium chloride, sodium chloride, acetaminophen, diphenhydrAMINE, ipratropium, levalbuterol, loperamide, ondansetron (ZOFRAN) IV, sodium chloride flush, sodium chloride flush   Vital Signs    Vitals:   12/16/18 0559 12/16/18 0627 12/16/18 0629 12/16/18 0749  BP: (!) 93/48 108/61 108/61   Pulse: 87 90 90   Resp: (!) 23 18 (!) 25   Temp:  98.9 F (37.2 C)    TempSrc:  Oral    SpO2: 97% 98% 98% 98%  Weight:  79.8 kg    Height:        Intake/Output Summary (Last 24 hours) at 12/16/2018 0908 Last data filed at 12/16/2018 0838 Gross per 24 hour  Intake 1385.63 ml  Output 450 ml  Net 935.63 ml   Last 3 Weights 12/16/2018 12/15/2018 12/14/2018  Weight (lbs) 175 lb 14.8 oz 172 lb 9.9  oz 170 lb 6.7 oz  Weight (kg) 79.8 kg 78.3 kg 77.3 kg     Telemetry    ? Atrial tach with conversion to NSR  - Personally Reviewed  ECG    Pending - Personally Reviewed  Physical Exam   GEN: No acute distress.  HEENT: Normocephalic, atraumatic, sclera non-icteric. Neck: No JVD or bruits. Cardiac: RRR no murmurs, rubs, or gallops.  Radials/DP/PT 1+ and equal bilaterally.  Respiratory: Coarse BS bilaterally, diffuse quiet wheezing. Breathing is unlabored on O2 GI: Soft, nontender, non-distended, BS +x 4. MS: no deformity. Extremities: No clubbing or cyanosis. No edema. Distal pedal pulses are 2+ and equal bilaterally. Neuro:  AAOx3. Follows commands. Hard of hearing Psych:  Responds to questions appropriately with a normal affect.  Labs    Chemistry Recent Labs  Lab 12/12/18 1225  12/14/18 0344 12/15/18 0350 12/16/18 0544  NA 136   < > 136 135 138  K 4.0   < > 3.6 4.1 4.2  CL 101   < > 102 104 107  CO2 22   < > 19* 21* 23  GLUCOSE 98   < > 111* 117* 100*  BUN 46*   < > 52* 54* 47*  CREATININE  2.26*   < > 2.14* 2.31* 2.12*  CALCIUM 9.1   < > 8.6* 8.8* 8.7*  PROT 6.4*  --   --   --   --   ALBUMIN 3.1*  --   --   --   --   AST 31  --   --   --   --   ALT 21  --   --   --   --   ALKPHOS 74  --   --   --   --   BILITOT 1.1  --   --   --   --   GFRNONAA 18*   < > 19* 18* 20*  GFRAA 21*   < > 22* 20* 23*  ANIONGAP 13   < > 15 10 8    < > = values in this interval not displayed.     Hematology Recent Labs  Lab 12/14/18 0344 12/15/18 0350 12/16/18 0544  WBC 10.1 11.3* 10.1  RBC 3.66* 3.43* 3.44*  HGB 11.7* 10.8* 10.8*  HCT 34.3* 32.0* 32.4*  MCV 93.7 93.3 94.2  MCH 32.0 31.5 31.4  MCHC 34.1 33.8 33.3  RDW 13.2 13.5 13.8  PLT 160 147* 147*    Cardiac EnzymesNo results for input(s): TROPONINI in the last 168 hours. No results for input(s): TROPIPOC in the last 168 hours.   BNP Recent Labs  Lab 12/12/18 1225  BNP 899.9*     DDimer No results for  input(s): DDIMER in the last 168 hours.   Radiology    No results found.  Cardiac Studies   2D echo 12/13/18   1. The left ventricle has normal systolic function with an ejection fraction of 60-65%. The cavity size was normal. There is mildly increased left ventricular wall thickness. Left ventricular diastolic Doppler parameters are indeterminate.  2. Trivial pericardial effusion is present.  3. The mitral valve is abnormal. Mild thickening of the mitral valve leaflet. There is mild mitral annular calcification present.  4. The tricuspid valve is grossly normal.  5. The aortic valve is tricuspid.  Patient Profile     85 y.o. female with HTN, hypothyroidism, breast CA, GERD, diastolic dysfunction/probable chronic diastolic CHF, chronic hyponatremia, pancreatic cyst, prior pleural effusion, CKD stage III, paroxysmal atrial fibrillation. She had questionable remote hx of AF, then atrial tachycardia 05/2018 in which she spontaneously converted to NSR. She was readmitted with generalized weakness/lethargy and found to have AKI on CKD, suppressed TSH, hypoalbuminemia, leukocytosis, mildly abnormal hsTroponin, and CXR with mild CHF vs atypical/viral PNA. Cardiology following for rapid atrial fib.  Assessment & Plan    1. Atrial fibrillation with RVR, also with h/o PAT - rate control has been challenging per notes. She was on metoprolol at home (50mg  BID) but since BP was soft, she was started on IV diltiazem and loaded with oral amio as well. HR appears to now be NSR. Will order f/u EKG this AM.  She is no longer on diltiazem drip. She remains on metoprolol 25 BID and oral amiodarone 400mg  BID for now. She is wheezing slightly. IM plans to try nebs. She is on low dose Eliquis now. Iatrogenic hyperthyroidism also likely contributing. Will discuss plan with MD.  2. AKI superimposed on CKD stage III - prior baseline was around 1.5, remaining slightly above 2 this admission. Losartan discontinued.   3. Possible PNA - Covid testing negative. Further per IM. Mild wheezing noted on exam. ? Try nebs. D/w IM.  4. Acute on  chronic diastolic CHF - I/O's and weights do not really reflect progress but clinically appears close to euvolemia. Now back on home dose of Lasix.   5. Elevated hsTroponin - flat at 67->60, not felt consistent with ACS. Felt to be demand ischemia in setting of AF RVR, CHF, AKI. No further ischemic w/u planned at this point.  6. Abnormal TSH - possibly has swung in the direction of hyperthyroid which could be contributing to AF/AT - synthyroid reduced by IM.  7. Mild anemia - needs to be monitored . No evidence of bleeding reported.  Tentatively arranged f/u 7/22 with me. She is a Glass blower/designer care team patient but he and I have followed her together as OP.  For questions or updates, please contact [place] Please consult [email or web address] for contact info under Cardiology/STEMI.  Signed, Charlie Pitter, PA-C 12/16/2018, 9:08 AM   ---------------------------------------------------------------------------------------------   History and all data above reviewed.  Patient examined.  I agree with the findings as above.  Cassandra Ray is feeling well and is anticipating discharge home.   Constitutional: No acute distress Eyes: pupils equally round and reactive to light, sclera non-icteric, normal conjunctiva and lids ENMT: normal dentition, moist mucous membranes Cardiovascular: regular rhythm, normal rate, no murmurs. S1 and S2 normal. Radial pulses normal bilaterally. No jugular venous distention.  Respiratory: mild, coarse breath sounds bilaterally. GI : normal bowel sounds, soft and nontender. No distention.   MSK: extremities warm, well perfused. No edema.  NEURO: grossly nonfocal exam, moves all extremities. PSYCH: alert and oriented x 3, normal mood and affect.   All available labs, radiology testing, previous records reviewed. Agree with documented assessment and  plan of my colleague as stated above with the following additions or changes:  Principal Problem:   New onset a-fib ([place]) Active Problems:   HTN (hypertension)   Hypothyroidism   Pleural effusion, right   Acute on chronic diastolic CHF (congestive heart failure) ([place])   Acute kidney injury superimposed on chronic kidney disease ([place])   Leukocytosis   Atrial fibrillation with RVR ([place])    Plan: agree with above documentation. Continue metoprolol, can likely decrease amlodipine to 200 mg daily on dismissal, with plan to revisit use as an outpatient, especially with mild hyperthyroidism. Continue amiodarone 400 mg BID while in hospital to complete as much of a 5 g load as possible. Will simplify regimen for homegoing.   F/u with D. Dunn at outpatient.   Time Spent Directly with Patient:  I have spent a total of 35 minutes with the patient reviewing hospital notes, telemetry, EKGs, labs and examining the patient as well as establishing an assessment and plan that was discussed personally with the patient.  > 50% of time was spent in direct patient care.  Length of Stay:  LOS: 3 days   Elouise Munroe, MD [facility] 5:04 PM  12/16/2018

## 2018-12-16 NOTE — Progress Notes (Signed)
Occupational Therapy Treatment Patient Details Name: Cassandra Ray MRN: [id number] DOB: [birth year] Today's Date: 12/16/2018    History of present illness 85 y.o. female with medical history significant for hypertension, hypothyroidism, chronic diastolic heart failure, pulmonary hypertension, gastroesophageal reflux disease, history of right breast cancer status post lumpectomy presented to the ED from independent living facility 2-day history of generalized weakness and lethargy per patient. Admitted for observation 12/12/18 of new onset of A-fib in the presence of Acute on chronic heart failure.    OT comments  Pt reports feeling much better up in chair. Declined bathing and dressing with OT, but performed toileting and seated grooming. Min assist for transfer with RW, total assist for pericare as pt is dependent on B UEs in standing, set up for seated grooming. Pt is progressing slowly, may need to update d/c disposition next visit.   Follow Up Recommendations  Home health OT;Supervision/Assistance - 24 hour    Equipment Recommendations  None recommended by OT    Recommendations for Other Services      Precautions / Restrictions Precautions Precautions: Fall Restrictions Weight Bearing Restrictions: No       Mobility Bed Mobility               General bed mobility comments: received in chair  Transfers Overall transfer level: Needs assistance Equipment used: Rolling walker (2 wheeled) Transfers: Sit to/from [facility] Sit to Stand: Min assist Stand pivot transfers: Min assist       General transfer comment: min assist to rise and steady, cues for upright posture    Balance Overall balance assessment: Needs assistance   Sitting balance-Leahy Scale: Fair     Standing balance support: Bilateral upper extremity supported Standing balance-Leahy Scale: Poor                             ADL either performed or assessed with clinical  judgement   ADL Overall ADL's : Needs assistance/impaired     Grooming: Brushing hair;Sitting;Set up                   Toilet Transfer: Minimal assistance;Stand-pivot;RW;BSC   Toileting- Clothing Manipulation and Hygiene: Total assistance;Sit to/from stand         General ADL Comments: pt is weak, declined working on bathing and dressing this visit     Vision       Perception     Praxis      Cognition Arousal/Alertness: Awake/alert Behavior During Therapy: WFL for tasks assessed/performed Overall Cognitive Status: Impaired/Different from baseline Area of Impairment: Memory                     Memory: Decreased short-term memory                  Exercises     Shoulder Instructions       General Comments      Pertinent Vitals/ Pain       Pain Assessment: No/denies pain  Home Living                                          Prior Functioning/Environment              Frequency  Min 2X/week        Progress Toward Goals  OT Goals(current  goals can now be found in the care plan section)  Progress towards OT goals: Progressing toward goals  Acute Rehab OT Goals Patient Stated Goal: get home to husband OT Goal Formulation: With patient Time For Goal Achievement: 12/27/18 Potential to Achieve Goals: Good  Plan Discharge plan remains appropriate    Co-evaluation                 AM-PAC OT "6 Clicks" Daily Activity     Outcome Measure   Help from another person eating meals?: None Help from another person taking care of personal grooming?: A Little Help from another person toileting, which includes using toliet, bedpan, or urinal?: A Lot Help from another person bathing (including washing, rinsing, drying)?: A Lot Help from another person to put on and taking off regular upper body clothing?: A Little Help from another person to put on and taking off regular lower body clothing?: A Lot 6 Click  Score: 16    End of Session Equipment Utilized During Treatment: Gait belt;Rolling walker  OT Visit Diagnosis: Muscle weakness (generalized) (M62.81)   Activity Tolerance Patient limited by fatigue   Patient Left in chair;with call bell/phone within reach   Nurse Communication Other (comment)(aware she had a BM and needs purewick replaced,-NT)        Time: 1287-8676 OT Time Calculation (min): 31 min  Charges: OT General Charges $OT Visit: 1 Visit OT Treatments $Self Care/Home Management : 23-37 mins  Nestor Lewandowsky, OTR/L Acute Rehabilitation Services Pager: [phone] Office: [phone]   Malka So 12/16/2018, 4:14 PM

## 2018-12-16 NOTE — Care Management Important Message (Signed)
Important Message  Patient Details  Name: Cassandra Ray MRN: [id number] Date of Birth: May 14, 1923   Medicare Important Message Given:  Yes     [name] 12/16/2018, 2:41 PM

## 2018-12-16 NOTE — Progress Notes (Signed)
PROGRESS NOTE    Cassandra Ray  VPX:[id number] DOB: [birth year] DOA: 12/12/2018 PCP: Lawerance Cruel, MD   Brief Narrative:  Patient is a pleasant 85 year old female medical history significant for questionable A. fib in 2015, hypertension, hypothyroidism, chronic diastolic heart failure, pulmonary hypertension, gastroesophageal reflux disease, history of right breast cancer status post lumpectomy presented to the ED from independent living facility with generalized weakness and noted to be in A. fib with RVR.  Patient admitted with A. fib with RVR placed on a Cardizem drip and also concern for heart failure.  Patient also noted to be in acute on chronic kidney disease stage III.  Cardiology consulted and are following.   Assessment & Plan:   Principal Problem:   New onset a-fib ([place]) Active Problems:   Acute on chronic diastolic CHF (congestive heart failure) (HCC)   HTN (hypertension)   Hypothyroidism   Pleural effusion, right   Acute kidney injury superimposed on chronic kidney disease (HCC)   Leukocytosis   Atrial fibrillation with RVR ([place])  1 new onset atrial fibrillation/paroxysmal A. fib with RVR Patient noted in the past have a questionable history of A. fib however does not hold a diagnosis of A. fib. CHA2DS2VASC score > 4.  Questionable etiology.  Patient denies any chest pain.  Patient with concerns for acute on chronic diastolic CHF.  Cardiac enzymes elevated but plateaued and likely had demand ischemia.  Patient presented to the ED with generalized weakness and lethargy noted to be in new onset A. fib with RVR with heart rates in the 130s.  Patient received 2 pushes of IV Cardizem with some initial improvement with heart rate however heart rate noted to be back in the 130s and fluctuating and as such patient started on a Cardizem drip.  Patient currently on Cardizem drip at max doses with borderline blood pressure however rate difficult to control and noted to be 150s the  night of 12/14/2018.  Improvement with shortness of breath however not at baseline.  Currently off Cardizem drip.  Monitor closely with diuresis.??  2D echo with EF of 60 to 65%, mildly increased left ventricular wall thickness, trivial pericardial effusion present, mildly thickening of mitral valve leaflet, normal tricuspid valve, tricuspid aortic valve. Patient seen in consultation by cardiology who are recommending anticoagulation and patient has been started on Eliquis.  Amiodarone and Lopressor added to current regimen. Cardiology following and appreciate input and recommendations.  2.  Acute on chronic diastolic heart failure Likely secondary to problem #1.  Patient noted to have an elevated BNP of 899.9.  Patient with some complaints of some intermittent shortness of breath.  Physical exam with some bibasilar and scattered crackles noted as well as on 12/14/2018.  Pulmonary exam with improvement today 12/15/2018.Marland Kitchen  Chest x-ray concerning for possible CHF versus atypical/viral pneumonia.  Patient afebrile.  Patient given a dose of Lasix 40 mg IV x1 in the ED.  Cardiac enzymes elevated but plateaued likely demand ischemia.    Patient with borderline blood pressure.  Urine output of 450 cc over the past 24 hours however doubt a properly recorded..  Current weight is 175.93 pounds from 172.62 pounds from 170.42 pounds from 172.62 pounds from 173.8 pounds on admission.  Patient with a bump in the creatinine on 12/15/2018 and trending back down.  IV Lasix has been transitioned to oral Lasix.  Strict I's and O's.  Daily weights.  Continue Lopressor and amiodarone.  Cardizem drip has been discontinued. Strict I's and O's.  Daily weights.  Cardiology following.    3.  Hypothyroidism TSH at 0.147.    Free T4 is 2.10.  Decreased home dose Synthroid to 100 MCG's daily.  Will need repeat thyroid function studies done in the outpatient setting in 4 to 6 weeks.    4.  Acute kidney injury on chronic kidney disease  stage III Patient with a creatinine of 2.26 on admission last creatinine was 1.49 on May 25, 2018.  Likely prerenal azotemia secondary to acute CHF exacerbation, in the setting of ARB.    Creatinine currently at 2.12 from 2.31 from 2.14 from 2.2.  Urine sodium of 87.  Urine creatinine of 24.96.  Fractional excretion of sodium is 5.79.  Renal ultrasound done showed bilateral cortical thinning however no evidence of hydronephrosis.  400 cc UOP over the past 24 hours.  Continue to hold ARB.  Patient has been transitioned from IV Lasix to oral Lasix.  Follow.   5.  Leukocytosis Likely reactive leukocytosis.  Patient afebrile.  Chest x-ray with no acute infiltrate noted.    Urinalysis nitrite negative, leukocytes negative.  WBC fluctuating.  No need for antibiotics at this time.  Follow.    6.  Gastroesophageal reflux disease Continue Pepcid.  7.  Weakness/fatigue Likely secondary to problems #1 and 2.  PT/OT.  Will need home health therapies on discharge.  8.  Hypokalemia Potassium repleted currently at 4.2.  Follow.    DVT prophylaxis: Eliquis Code Status: DNR Family Communication: Updated patient.   Disposition Plan: Home once A. fib is resolved and per cardiology hopefully in the next 1 to 2 days..   Consultants:   Cardiology: Dr. [name] 12/12/2018  Procedures:   2D echo 12/13/2018  Chest x-ray 12/13/2018  Renal ultrasound 12/13/2018  Antimicrobials:  None   Subjective: Patient sitting up in bed.  States she is feeling better.  Asking when she can go home.  Shortness of breath improving.  Denies any chest pain.  Patient noted to have converted to normal sinus rhythm.   Objective: Vitals:   12/16/18 0627 12/16/18 0629 12/16/18 0749 12/16/18 0948  BP: 108/61 108/61  (!) 107/56  Pulse: 90 90  97  Resp: 18 (!) 25    Temp: 98.9 F (37.2 C)     TempSrc: Oral     SpO2: 98% 98% 98%   Weight: 79.8 kg     Height:        Intake/Output Summary (Last 24 hours) at  12/16/2018 1047 Last data filed at 12/16/2018 0838 Gross per 24 hour  Intake 1382.63 ml  Output 450 ml  Net 932.63 ml   Filed Weights   12/14/18 0444 12/15/18 0541 12/16/18 0627  Weight: 77.3 kg 78.3 kg 79.8 kg    Examination:  General exam: NAD Respiratory system: Minimal expiratory wheezing.  No rhonchi, no crackles.  Speaking in full sentences. Cardiovascular system: Regular rate and rhythm no murmurs rubs or gallops.  No JVD.  No lower extremity edema.   Gastrointestinal system: Abdomen is soft, nontender, nondistended, positive bowel sounds.  No rebound.  No guarding.  Central nervous system: Alert and oriented. No focal neurological deficits. Extremities: Symmetric 5 x 5 power. Skin: No rashes, lesions or ulcers Psychiatry: Judgement and insight appear normal. Mood & affect appropriate.     Data Reviewed: I have personally reviewed following labs and imaging studies  CBC: Recent Labs  Lab 12/12/18 1225 12/13/18 0500 12/14/18 0344 12/15/18 0350 12/16/18 0544  WBC 13.0* 10.5 10.1 11.3* 10.1  NEUTROABS 8.6*  --   --   --   --   HGB 13.0 12.1 11.7* 10.8* 10.8*  HCT 38.6 35.7* 34.3* 32.0* 32.4*  MCV 94.1 93.5 93.7 93.3 94.2  PLT 175 159 160 147* 741*   Basic Metabolic Panel: Recent Labs  Lab 12/12/18 1225 12/13/18 0500 12/14/18 0344 12/15/18 0350 12/16/18 0544  NA 136 140 136 135 138  K 4.0 3.4* 3.6 4.1 4.2  CL 101 104 102 104 107  CO2 22 22 19* 21* 23  GLUCOSE 98 101* 111* 117* 100*  BUN 46* 48* 52* 54* 47*  CREATININE 2.26* 2.20* 2.14* 2.31* 2.12*  CALCIUM 9.1 8.7* 8.6* 8.8* 8.7*  MG  --  2.0 2.1  --   --    GFR: Estimated Creatinine Clearance: 16.9 mL/min (A) (by C-G formula based on SCr of 2.12 mg/dL (H)). Liver Function Tests: Recent Labs  Lab 12/12/18 1225  AST 31  ALT 21  ALKPHOS 74  BILITOT 1.1  PROT 6.4*  ALBUMIN 3.1*   No results for input(s): LIPASE, AMYLASE in the last 168 hours. No results for input(s): AMMONIA in the last 168  hours. Coagulation Profile: No results for input(s): INR, PROTIME in the last 168 hours. Cardiac Enzymes: No results for input(s): CKTOTAL, CKMB, CKMBINDEX, TROPONINI in the last 168 hours. BNP (last 3 results) No results for input(s): PROBNP in the last 8760 hours. HbA1C: No results for input(s): HGBA1C in the last 72 hours. CBG: No results for input(s): GLUCAP in the last 168 hours. Lipid Profile: No results for input(s): CHOL, HDL, LDLCALC, TRIG, CHOLHDL, LDLDIRECT in the last 72 hours. Thyroid Function Tests: No results for input(s): TSH, T4TOTAL, FREET4, T3FREE, THYROIDAB in the last 72 hours. Anemia Panel: No results for input(s): VITAMINB12, FOLATE, FERRITIN, TIBC, IRON, RETICCTPCT in the last 72 hours. Sepsis Labs: No results for input(s): PROCALCITON, LATICACIDVEN in the last 168 hours.  Recent Results (from the past 240 hour(s))  SARS Coronavirus 2 (CEPHEID - Performed in [place] hospital lab), Hosp Order     Status: None   Collection Time: 12/12/18  1:49 PM   Specimen: Nasopharyngeal Swab  Result Value Ref Range Status   SARS Coronavirus 2 NEGATIVE NEGATIVE Final    Comment: (NOTE) If result is NEGATIVE SARS-CoV-2 target nucleic acids are NOT DETECTED. The SARS-CoV-2 RNA is generally detectable in upper and lower  respiratory specimens during the acute phase of infection. The lowest  concentration of SARS-CoV-2 viral copies this assay can detect is 250  copies / mL. A negative result does not preclude SARS-CoV-2 infection  and should not be used as the sole basis for treatment or other  patient management decisions.  A negative result may occur with  improper specimen collection / handling, submission of specimen other  than nasopharyngeal swab, presence of viral mutation(s) within the  areas targeted by this assay, and inadequate number of viral copies  (<250 copies / mL). A negative result must be combined with clinical  observations, patient history, and  epidemiological information. If result is POSITIVE SARS-CoV-2 target nucleic acids are DETECTED. The SARS-CoV-2 RNA is generally detectable in upper and lower  respiratory specimens dur ing the acute phase of infection.  Positive  results are indicative of active infection with SARS-CoV-2.  Clinical  correlation with patient history and other diagnostic information is  necessary to determine patient infection status.  Positive results do  not rule out bacterial infection or co-infection with other viruses. If result is  PRESUMPTIVE POSTIVE SARS-CoV-2 nucleic acids MAY BE PRESENT.   A presumptive positive result was obtained on the submitted specimen  and confirmed on repeat testing.  While 2019 novel coronavirus  (SARS-CoV-2) nucleic acids may be present in the submitted sample  additional confirmatory testing may be necessary for epidemiological  and / or clinical management purposes  to differentiate between  SARS-CoV-2 and other Sarbecovirus currently known to infect humans.  If clinically indicated additional testing with an alternate test  methodology [id number]) is advised. The SARS-CoV-2 RNA is generally  detectable in upper and lower respiratory sp ecimens during the acute  phase of infection. The expected result is Negative. Fact Sheet for Patients:  StrictlyIdeas.no Fact Sheet for Healthcare Providers: BankingDealers.co.za This test is not yet approved or cleared by the [place] FDA and has been authorized for detection and/or diagnosis of SARS-CoV-2 by FDA under an Emergency Use Authorization (EUA).  This EUA will remain in effect (meaning this test can be used) for the duration of the COVID-19 declaration under Section 564(b)(1) of the Act, 21 U.S.C. section 360bbb-3(b)(1), unless the authorization is terminated or revoked sooner. Performed at [facility], [place] [address], [place], [place] [address]   Urine Culture      Status: Abnormal   Collection Time: 12/12/18  8:20 PM   Specimen: Urine, Clean Catch  Result Value Ref Range Status   Specimen Description URINE, CLEAN CATCH  Final   Special Requests   Final    NONE Performed at [facility], [place] [address], [place], [place] [address]    Culture MULTIPLE SPECIES PRESENT, SUGGEST RECOLLECTION (A)  Final   Report Status 12/13/2018 FINAL  Final  MRSA PCR Screening     Status: None   Collection Time: 12/12/18  8:42 PM   Specimen: Nasal Mucosa; Nasopharyngeal  Result Value Ref Range Status   MRSA by PCR NEGATIVE NEGATIVE Final    Comment:        The GeneXpert MRSA Assay (FDA approved for NASAL specimens only), is one component of a comprehensive MRSA colonization surveillance program. It is not intended to diagnose MRSA infection nor to guide or monitor treatment for MRSA infections. Performed at [facility], [place] [address], [place], [place] [address]          Radiology Studies: No results found.      Scheduled Meds:  acidophilus  1 capsule Oral Daily   amiodarone  400 mg Oral BID   apixaban  2.5 mg Oral BID   bisacodyl  5 mg Oral Once   famotidine  20 mg Oral Daily   furosemide  40 mg Oral Daily   levothyroxine  100 mcg Oral QAC breakfast   lipase/protease/amylase  72,000 Units Oral TID WC   Melatonin  6 mg Oral QHS   metoprolol tartrate  25 mg Oral BID   mometasone-formoterol  2 puff Inhalation BID   pantoprazole  40 mg Oral Daily   sodium chloride flush  3 mL Intravenous Q12H   sodium chloride flush  3 mL Intravenous Q12H   Continuous Infusions:  sodium chloride     sodium chloride     diltiazem (CARDIZEM) infusion Stopped (12/16/18 0545)   sodium chloride       LOS: 3 days    Time spent: 40 minutes    Irine Seal, MD [facility]  If 7PM-7AM, please contact night-coverage [email or web address] 12/16/2018, 10:47 AM

## 2018-12-17 LAB — HEMOGLOBIN AND HEMATOCRIT, BLOOD
HCT: 32.3 % — ABNORMAL LOW (ref 36.0–46.0)
Hemoglobin: 10.8 g/dL — ABNORMAL LOW (ref 12.0–15.0)

## 2018-12-17 LAB — BASIC METABOLIC PANEL
Anion gap: 10 (ref 5–15)
BUN: 45 mg/dL — ABNORMAL HIGH (ref 8–23)
CO2: 23 mmol/L (ref 22–32)
Calcium: 8.7 mg/dL — ABNORMAL LOW (ref 8.9–10.3)
Chloride: 105 mmol/L (ref 98–111)
Creatinine, Ser: 2.16 mg/dL — ABNORMAL HIGH (ref 0.44–1.00)
GFR calc Af Amer: 22 mL/min — ABNORMAL LOW (ref >60)
GFR calc non Af Amer: 19 mL/min — ABNORMAL LOW (ref >60)
Glucose, Bld: 99 mg/dL (ref 70–99)
Potassium: 3.9 mmol/L (ref 3.5–5.1)
Sodium: 138 mmol/L (ref 135–145)

## 2018-12-17 MED ORDER — METOPROLOL TARTRATE 12.5 MG HALF TABLET: 12.5000 mg | ORAL_TABLET | Freq: Two times a day (BID) | ORAL | Status: DC

## 2018-12-17 MED ORDER — APIXABAN 2.5 MG PO TABS: 2.5000 mg | ORAL_TABLET | Freq: Two times a day (BID) | ORAL | 0 refills | Status: DC

## 2018-12-17 MED ORDER — LEVOTHYROXINE SODIUM 100 MCG PO TABS: 100.0000 ug | ORAL_TABLET | Freq: Every day | ORAL | 0 refills | Status: DC

## 2018-12-17 MED ORDER — AMIODARONE HCL 200 MG PO TABS: 200.0000 mg | ORAL_TABLET | Freq: Every day | ORAL | Status: DC

## 2018-12-17 MED ORDER — LEVALBUTEROL TARTRATE 45 MCG/ACT IN AERO: 2.0000 | INHALATION_SPRAY | RESPIRATORY_TRACT | 0 refills | Status: DC | PRN

## 2018-12-17 MED ORDER — FAMOTIDINE 40 MG/5ML PO SUSR: 20.0000 mg | Freq: Every day | ORAL | 0 refills | Status: DC

## 2018-12-17 MED ORDER — AMIODARONE HCL 200 MG PO TABS: 200.0000 mg | ORAL_TABLET | Freq: Every day | ORAL | 0 refills | Status: DC

## 2018-12-17 MED ORDER — METOPROLOL TARTRATE 25 MG PO TABS: 12.5000 mg | ORAL_TABLET | Freq: Two times a day (BID) | ORAL | 0 refills | Status: DC

## 2018-12-17 NOTE — Progress Notes (Signed)
  Speech Language Pathology Treatment: Dysphagia  Patient Details Name: Cassandra Ray MRN: [id number] DOB: 1922-10-20 Today's Date: 12/17/2018 Time: 3112-1624 SLP Time Calculation (min) (ACUTE ONLY): 9 min  Assessment / Plan / Recommendation Clinical Impression  Pt was seen briefly this afternoon for f/u after swallow evaluation. Pt acknowledges her prior swallowing therapy and says that she knows what to do. In practice, she uses a chin tuck with Mod I while consuming thin liquids, but she does not use a second swallow, stating that she "doesn't know what that is." SLP explained and demonstrated use of a second dry swallow, after which pt implemented this strategy with supervision. Recommend to continue with regular solids and thin liquids as long as she can adhere to her precautions. Per pt, she may d/c as early as this afternoon, but will f/u briefly if she remains inpatient.   HPI HPI: Patient is a pleasant 85 year old female medical history significant for questionable A. fib in 2015, hypertension, hypothyroidism, chronic diastolic heart failure, pulmonary hypertension, gastroesophageal reflux disease, history of right breast cancer status post lumpectomy presented to the ED from independent living facility with generalized weakness and noted to be in A. fib with RVR. Chest x-ray concerning for possible CHF versus atypical/viral pneumonia. Patient with h/o dysphagia since at least 2017 with esophageal and mild pharyngeal components. Chin tuck posture prevented aspiration during most recent study 09/14/2017. Recommendations at that time were for dysphagia 3 solids, thin liquid with use of chin tuck and double swallow. Discharged from [place] services 12/2017 mod I with swallowing precuations/strategies.       SLP Plan  Continue with current plan of care       Recommendations  Diet recommendations: Regular;Thin liquid Liquids provided via: Cup;Straw Medication Administration: Whole meds  with liquid Supervision: Patient able to self feed;Intermittent supervision to cue for compensatory strategies Compensations: Slow rate;Small sips/bites;Multiple dry swallows after each bite/sip;Chin tuck Postural Changes and/or Swallow Maneuvers: Seated upright 90 degrees                Oral Care Recommendations: Oral care BID Follow up Recommendations: None SLP Visit Diagnosis: Dysphagia, pharyngoesophageal phase (R13.14) Plan: Continue with current plan of care       GO                Cassandra Ray 12/17/2018, 3:44 PM  Pollyann Glen, M.A. [place] Acute Environmental education officer [phone] Office [phone]

## 2018-12-17 NOTE — TOC Progression Note (Signed)
Transition of Care [facility]) - Progression Note    Patient Details  Name: Cassandra Ray MRN: [id number] Date of Birth: [birth year]  Transition of Care [facility]) CM/SW Contact  Graves-Bigelow, Ocie Cornfield, RN Phone Number: 12/17/2018, 1:20 PM  Clinical Narrative:  Patient did decline other disciplines for [facility] RN, OT and SW. No further needs from CM at this time.     Expected Discharge Plan: [place] Barriers to Discharge: No Barriers Identified  Expected Discharge Plan and Services Expected Discharge Plan: [place] In-house Referral: NA Discharge Planning Services: CM Consult   Living arrangements for the past 2 months: [place]: PT(Declined HH RN, OT only wanted PT) [place] Agency: Other - See comment([facility]) Date HH Agency Contacted: 12/17/18 Time [place]: 1308 Representative spoke with at [place]: [place] ([place]) Interventions    Readmission Risk Interventions No flowsheet data found.

## 2018-12-17 NOTE — TOC Progression Note (Signed)
Transition of Care [facility]) - Progression Note    Patient Details  Name: Cassandra Ray MRN: [id number] Date of Birth: Feb 21, 1923  Transition of Care [facility]) CM/SW Contact  Graves-Bigelow, Ocie Cornfield, RN Phone Number: 12/17/2018, 1:08 PM  Clinical Narrative:  CM discussed with patient regarding [place] needs and she wants to use [facility] [place] did call Delcie Roch at [facility] at [phone] and they will have PT set up in the patient's [place]. CM will send information. Patient states her husband will provide transportation home.    Expected Discharge Plan: Home/Self Care Barriers to Discharge: No Barriers Identified  Expected Discharge Plan and Services Expected Discharge Plan: Home/Self Care In-house Referral: NA Discharge Planning Services: CM Consult   Living arrangements for the past 2 months: [place]: PT [place] Agency: Other - See comment([facility]) Date HH Agency Contacted: 12/17/18 Time [place]: 1308 Representative spoke with at [place]: [place] ([place]) Interventions    Readmission Risk Interventions No flowsheet data found.

## 2018-12-17 NOTE — Progress Notes (Addendum)
Progress Note  Patient Name: Cassandra Ray Date of Encounter: 12/17/2018  Primary Cardiologist: Candee Furbish, MD   Subjective   Patient is feeling well. She denies sob or palpitations. Does have generalized weakness.   Inpatient Medications    Scheduled Meds: . acidophilus  1 capsule Oral Daily  . amiodarone  400 mg Oral BID  . apixaban  2.5 mg Oral BID  . bisacodyl  5 mg Oral Once  . famotidine  20 mg Oral Daily  . furosemide  40 mg Oral Daily  . levothyroxine  100 mcg Oral QAC breakfast  . lipase/protease/amylase  72,000 Units Oral TID WC  . Melatonin  6 mg Oral QHS  . metoprolol tartrate  25 mg Oral BID  . mometasone-formoterol  2 puff Inhalation BID  . pantoprazole  40 mg Oral Daily  . sodium chloride flush  3 mL Intravenous Q12H   Continuous Infusions: . sodium chloride    . sodium chloride     PRN Meds: sodium chloride, acetaminophen, diphenhydrAMINE, ipratropium, levalbuterol, loperamide, ondansetron (ZOFRAN) IV, sodium chloride flush   Vital Signs    Vitals:   12/16/18 2123 12/17/18 0500 12/17/18 0643 12/17/18 0756  BP: 131/68  112/70   Pulse: 90  82 83  Resp: 20  19 18   Temp: 97.8 F (36.6 C)  98.1 F (36.7 C)   TempSrc: Oral  Oral   SpO2: 99%  97% 95%  Weight:  78.5 kg    Height:        Intake/Output Summary (Last 24 hours) at 12/17/2018 1023 Last data filed at 12/17/2018 0953 Gross per 24 hour  Intake 400 ml  Output 500 ml  Net -100 ml   Last 3 Weights 12/17/2018 12/16/2018 12/15/2018  Weight (lbs) 173 lb 1 oz 175 lb 14.8 oz 172 lb 9.9 oz  Weight (kg) 78.5 kg 79.8 kg 78.3 kg      Telemetry    NSR, HR 80-90s, no arrhythmias noted- Personally Reviewed  ECG    12/16/18 EKG NSR HR 82, 1st degree AV block PRI (260ms) not new - Personally Reviewed  Physical Exam   GEN: No acute distress.   Neck: No JVD Cardiac: RRR, no murmurs, rubs, or gallops.  Respiratory: Clear to auscultation bilaterally. GI: Soft, nontender, non-distended  MS: No  edema; No deformity. Neuro:  Nonfocal  Psych: Normal affect   Labs    High Sensitivity Troponin:   Recent Labs  Lab 12/12/18 1225 12/12/18 1610  TROPONINIHS 67* 60*      Cardiac EnzymesNo results for input(s): TROPONINI in the last 168 hours. No results for input(s): TROPIPOC in the last 168 hours.   Chemistry Recent Labs  Lab 12/12/18 1225  12/15/18 0350 12/16/18 0544 12/17/18 0716  NA 136   < > 135 138 138  K 4.0   < > 4.1 4.2 3.9  CL 101   < > 104 107 105  CO2 22   < > 21* 23 23  GLUCOSE 98   < > 117* 100* 99  BUN 46*   < > 54* 47* 45*  CREATININE 2.26*   < > 2.31* 2.12* 2.16*  CALCIUM 9.1   < > 8.8* 8.7* 8.7*  PROT 6.4*  --   --   --   --   ALBUMIN 3.1*  --   --   --   --   AST 31  --   --   --   --   ALT 21  --   --   --   --  ALKPHOS 74  --   --   --   --   BILITOT 1.1  --   --   --   --   GFRNONAA 18*   < > 18* 20* 19*  GFRAA 21*   < > 20* 23* 22*  ANIONGAP 13   < > 10 8 10    < > = values in this interval not displayed.     Hematology Recent Labs  Lab 12/14/18 0344 12/15/18 0350 12/16/18 0544 12/17/18 0716  WBC 10.1 11.3* 10.1  --   RBC 3.66* 3.43* 3.44*  --   HGB 11.7* 10.8* 10.8* 10.8*  HCT 34.3* 32.0* 32.4* 32.3*  MCV 93.7 93.3 94.2  --   MCH 32.0 31.5 31.4  --   MCHC 34.1 33.8 33.3  --   RDW 13.2 13.5 13.8  --   PLT 160 147* 147*  --     BNP Recent Labs  Lab 12/12/18 1225  BNP 899.9*     DDimer No results for input(s): DDIMER in the last 168 hours.   Radiology    No results found.  Cardiac Studies   2D echo 12/13/18  1. The left ventricle has normal systolic function with an ejection fraction of 60-65%. The cavity size was normal. There is mildly increased left ventricular wall thickness. Left ventricular diastolic Doppler parameters are indeterminate. 2. Trivial pericardial effusion is present. 3. The mitral valve is abnormal. Mild thickening of the mitral valve leaflet. There is mild mitral annular calcification present.  4. The tricuspid valve is grossly normal. 5. The aortic valve is tricuspid.  Patient Profile     85 y.o. female female with HTN, hypothyroidism, breast CA, GERD, diastolic dysfunction/probable chronic diastolic CHF, chronic hyponatremia, pancreatic cyst, prior pleural effusion, CKD stage III, paroxysmal atrial fibrillation. She had questionable remote hx of AF, then atrial tachycardia 05/2018 in which she spontaneously converted to NSR. She was readmitted with generalized weakness/lethargy and found to have AKI on CKD, suppressed TSH, hypoalbuminemia, leukocytosis, mildly abnormal hsTroponin, and CXR with mild CHF vs atypical/viral PNA. Cardiology following for rapid atrial fib.  Assessment & Plan    1. Afib with RVR -patient was previously on IV Cardizem with little improvement -Switched to oral amio, 400mg  BID -Patient converted to NSR 2 nights ago -She has been maintaining  NSR, HR 80-90s -EKG yesterday showed NSR with 1st degree AV block which was recorded back in January this year -a/c on Eliquis -Symptomatically improved -Decrease Lopressor 12.5mg  BID -Start Amio 200mg  daily  2. Acute on Chronic Diastolic CHF -patient currently on lasix 40mg  daily -Creatinine elevated 2.16 -I/Os not representative of diuresis -Patient says baseline weight is around 166lbs at home. Most recent weight 173 lbs -patient appears euvolemic on exam -Will need recheck BMET oupatient  3. AKI/CKDIII -Baseline around 1.5 -Levels 2.12>2.16 -Close follow-up outpatient with recheck BMET  4. Elevated Troponin -Levels flattened 67>60 -Likely due to demand ischemia in setting of AF RVR, CHF  5. Hypothyroidism -TSH 0.147 FT4 1.57 -Synthroid decreased per IM -possibly contributing to Afib -further work-up outpatient   For questions or updates, please contact [place] Please consult [email or web address] for contact info under   Signed, Cadence Ninfa Meeker, PA-C  12/17/2018, 10:23 AM     ---------------------------------------------------------------------------------------------   History and all data above reviewed.  Patient examined.  I agree with the findings as above.  Cassandra Ray feels well and is ready to go home.   Constitutional: No acute distress Eyes: pupils  equally round and reactive to light, sclera non-icteric, normal conjunctiva and lids ENMT: normal dentition, moist mucous membranes Cardiovascular: regular rhythm, normal rate, no murmurs. S1 and S2 normal. Radial pulses normal bilaterally. No jugular venous distention.  Respiratory: clear to auscultation bilaterally GI : normal bowel sounds, soft and nontender. No distention.  MSK: extremities warm, well perfused. No edema.  NEURO: grossly nonfocal exam, moves all extremities. PSYCH: alert and oriented x 3, normal mood and affect.   All available labs, radiology testing, previous records reviewed. Agree with documented assessment and plan of my colleague as stated above with the following additions or changes:  Principal Problem:   New onset a-fib ([place]) Active Problems:   HTN (hypertension)   Hypothyroidism   Pleural effusion, right   Acute on chronic diastolic CHF (congestive heart failure) ([place])   Acute kidney injury superimposed on chronic kidney disease ([place])   Leukocytosis   Atrial fibrillation with RVR ([facility])    Plan: Continue Amiodarone 200 mg daily until follow up where dose can be adjusted or medication can be stopped if indicated. Will decrease lopressor now that she is in sinus rhythm. Will need repeat BMET as outpatient.   Follow up thyroid as outpatient as well.   Patient planned for d/c today.   Time Spent Directly with Patient:  I have spent a total of 25 minutes with the patient reviewing hospital notes, telemetry, EKGs, labs and examining the patient as well as establishing an assessment and plan that was discussed personally with the patient.  > 50% of time was spent in  direct patient care.  Length of Stay:  LOS: 4 days   Elouise Munroe, MD [facility] 4:14 PM  12/17/2018

## 2018-12-17 NOTE — Discharge Summary (Signed)
Physician Discharge Summary  Cassandra Ray YYT:[id number] DOB: Jun 17, 1922 DOA: 12/12/2018  PCP: Lawerance Cruel, MD  Admit date: 12/12/2018 Discharge date: 12/17/2018  Time spent: 60 minutes  Recommendations for Outpatient Follow-up:  1. Follow-up with Sharrell Ku, PA cardiology on 12/25/2018.  On follow-up patient will need a basic metabolic profile done to follow-up on electrolytes and renal function.  Patient's A. fib will need to be reassessed. 2. Follow-up with Dr. [name] cardiology 3. Follow-up with Lawerance Cruel, MD in 2 weeks.  Patient's Synthroid dose was decreased to 100 MCG's daily.  Patient will need repeat thyroid function studies done in 4 to 6 weeks.  Patient will need a basic metabolic profile done to follow-up on electrolytes and renal function.   Discharge Diagnoses:  Principal Problem:   New onset a-fib [facility]) Active Problems:   Acute on chronic diastolic CHF (congestive heart failure) (HCC)   HTN (hypertension)   Hypothyroidism   Pleural effusion, right   Acute kidney injury superimposed on chronic kidney disease (HCC)   Leukocytosis   Atrial fibrillation with RVR ([place])   Discharge Condition: Stable and improved  Diet recommendation: Heart healthy  Filed Weights   12/15/18 0541 12/16/18 0627 12/17/18 0500  Weight: 78.3 kg 79.8 kg 78.5 kg    History of present illness:   Cassandra Ray is a 85 y.o. female with medical history significant of ??  History of A. fib in 2015, hypertension, hypothyroidism, chronic diastolic heart failure, pulmonary hypertension, gastroesophageal reflux disease, history of right breast cancer status post lumpectomy presented to the ED from independent living facility 2-day history of generalized weakness and lethargy per patient.  Patient denies any fevers, no chills, no nausea, no vomiting, no chest pain, no emesis, no diarrhea, no constipation, no abdominal pain, no syncope, no lightheadedness, no palpitations, no melena, no  hematemesis, no hematochezia.  Patient does endorse some intermittent shortness of breath.  Patient states her husband called the nurse at the facility who came and assessed her patient noted to be in A. fib with RVR and as such EMS was called and patient brought to the ED.  Patient does endorse compliance with medications and diet.  ED Course: Patient seen in the ED noted to be in A. fib with RVR.  Patient given some Cardizem pushes with some improvement with heart rate however heart rate continue to fluctuate as high as the 130s.  Patient subsequently started on a Cardizem drip.  Comprehensive metabolic profile obtained with a BUN of 46, creatinine of 2.26, albumin of 3.1, protein of 6.4 otherwise was within normal limits.  BNP elevated at 899.9.  High-sensitivity troponin at 67 and second set at 60.  CBC with a white count of 13 otherwise was within normal limits.  TSH noted to be at 0.147.  Chest x-ray done with some acute bronchitic change which could represent mild CHF or atypical/viral pneumonia.  No evidence of consolidating pneumonia or overt alveolar pulmonary edema.  EKG done showing A. fib with RVR with a rate of 136.  Cardiology consulted.  Hospital Course:  1 new onset atrial fibrillation/paroxysmal A. fib with RVR Patient noted in the past have a questionable history of A. fib however does not hold a diagnosis of A. fib.CHA2DS2VASC score > 4.Questionable etiology. Patient denied any chest pain. Patient with concerns for acute on chronic diastolic CHF. Cardiac enzymes elevated but plateaued and likely had demand ischemia. Patient presented to the ED with generalized weakness and lethargy noted to be  in new onset A. fib with RVR with heart rates in the 130s. Patient received 2 pushes of IV Cardizem with some initial improvement with heart rate however heart rate noted to be back in the 130s and fluctuating and as such patient started on a Cardizem drip.  Patient was placed on Cardizem  drip at max doses with borderline blood pressure however rate difficult to control and noted to be 150s the night of 12/14/2018.  Patient was started on amiodarone and Lopressor added to the regimen per cardiology.  Patient improved clinically. 2D echo with EF of 60 to 65%, mildly increased left ventricular wall thickness, trivial pericardial effusion present, mildly thickening of mitral valve leaflet, normal tricuspid valve, tricuspid aortic valve. Patient subsequently converted to normal sinus rhythm.  IV Cardizem subsequently discontinued.  Amiodarone dose was decreased to 200 mg daily per cardiology after being on amiodarone 400 mg twice daily.  Lopressor dose was decreased to 12.5 mg on day of discharge.  Patient remained in sinus rhythm for the rest of the hospitalization.  Patient be discharged on amiodarone 200 mg daily as well as Lopressor 12.5 mg daily.  Outpatient follow-up with cardiology.   2. Acute on chronic diastolic heart failure Likely secondary to problem #1. Patient noted to have an elevated BNP of 899.9. Patient with some complaints of some intermittent shortness of breath during the hospitalization. Physical exam with some bibasilar and scattered crackles noted as well as on 12/14/2018.  Pulmonary exam improved during the hospitalization such that by day of discharge lungs were clear.  Chest x-ray concerning for possible CHF versus atypical/viral pneumonia. Patient afebrile. Patient given a dose of Lasix 40 mg IV x1 in the ED. Cardiac enzymes elevated but plateaued likely demand ischemia.   Patient with borderline blood pressure.  Urine output of 450 cc over the past 24 hours however doubt a properly recorded.. Patient improved clinically subsequently transition from IV Lasix to home dose oral Lasix.  Patient also on Lopressor and amiodarone.  Cardiology followed the patient throughout the hospitalization.  Patient was euvolemic by day of discharge.  Outpatient follow-up with  cardiology.  3. Hypothyroidism TSH at 0.147.   Free T4 is 2.10.    Patient's home dose Synthroid was decreased to 100 MCG's daily.  Patient will need repeat thyroid function studies done in the outpatient setting in 4 to 6 weeks with outpatient follow-up with PCP.    4. Acute kidney injury on chronic kidney disease stage III Patient with a creatinine of 2.26 on admission last creatinine was 1.49 on May 25, 2018. Likely prerenal azotemia secondary to acute CHF exacerbation, in the setting of ARB.   Creatinine currently stabilized at 2.16 on day of discharge, from 2.12 from 2.31 from 2.14 from 2.2.  Urine sodium of 87.  Urine creatinine of 24.96.  Fractional excretion of sodium is 5.79.  Renal ultrasound done showed bilateral cortical thinning however no evidence of hydronephrosis.    Patient's ARB was held and was discontinued on discharge.  Patient received IV Lasix with good urine output and subsequently transition to home dose oral Lasix.  Renal function stabilized.  Outpatient follow-up.   5. Leukocytosis Likely reactive leukocytosis. Patient afebrile. Chest x-ray with no acute infiltrate noted.   Urinalysis nitrite negative, leukocytes negative.  WBC trending down.  Outpatient follow-up.    6. Gastroesophageal reflux disease Patient was maintained on Pepcid.  Outpatient follow-up.  7. Weakness/fatigue Likely secondary to problems #1 and 2. PT/OT.    Patient  be discharged home with home health therapies.    8.  Hypokalemia Potassium repleted currently at 3.9.  Follow.   Procedures:  2D echo 12/13/2018  Chest x-ray 12/13/2018  Renal ultrasound 12/13/2018   Consultations:  Cardiology: Dr. [name] 12/12/2018  Discharge Exam: Vitals:   12/17/18 0756 12/17/18 1258  BP:  118/67  Pulse: 83 79  Resp: 18 (!) 22  Temp:  98.1 F (36.7 C)  SpO2: 95% 98%    General: NAD Cardiovascular: RRR Respiratory: CTAB  Discharge Instructions   Discharge Instructions     Diet - low sodium heart healthy   Complete by: As directed    Increase activity slowly   Complete by: As directed      Allergies as of 12/17/2018      Reactions   Tape Other (See Comments)   Patient prefers easy-release tape      Medication List    STOP taking these medications   losartan 50 MG tablet Commonly known as: COZAAR   pantoprazole 40 MG tablet Commonly known as: PROTONIX     TAKE these medications   acetaminophen 500 MG tablet Commonly known as: TYLENOL Take 500-1,000 mg by mouth every 8 (eight) hours as needed for mild pain or headache.   Align 4 MG Caps Take 4 mg by mouth daily.   amiodarone 200 MG tablet Commonly known as: PACERONE Take 1 tablet (200 mg total) by mouth daily. Start taking on: December 18, 2018   apixaban 2.5 MG Tabs tablet Commonly known as: ELIQUIS Take 1 tablet (2.5 mg total) by mouth 2 (two) times daily.   B-complex with vitamin C tablet Take 1 tablet by mouth daily.   CITRACAL +D3 PO Take 1 tablet by mouth daily.   famotidine 40 MG/5ML suspension Commonly known as: PEPCID Take 2.5 mLs (20 mg total) by mouth daily. Start taking on: December 18, 2018   furosemide 40 MG tablet Commonly known as: LASIX Take 1 tablet (40 mg total) by mouth daily.   Glucosamine HCl 1500 MG Tabs Take 3,000 mg by mouth at bedtime.   levalbuterol 45 MCG/ACT inhaler Commonly known as: XOPENEX HFA Inhale 2 puffs into the lungs every 4 (four) hours as needed for wheezing.   levothyroxine 100 MCG tablet Commonly known as: SYNTHROID Take 1 tablet (100 mcg total) by mouth daily before breakfast. What changed:   medication strength  how much to take   lipase/protease/amylase 36000 UNITS Cpep capsule Commonly known as: CREON Take 72,000 Units by mouth 3 (three) times daily.   loperamide 2 MG tablet Commonly known as: IMODIUM A-D Take 2 mg by mouth as needed for diarrhea or loose stools.   metoprolol tartrate 25 MG tablet Commonly known as:  LOPRESSOR Take 0.5 tablets (12.5 mg total) by mouth 2 (two) times daily. What changed:   medication strength  how much to take   nitroGLYCERIN 0.4 MG SL tablet Commonly known as: NITROSTAT Place 0.4 mg under the tongue every 5 (five) minutes as needed for chest pain.   Symbicort 80-4.5 MCG/ACT inhaler Generic drug: budesonide-formoterol Take 2 puffs by mouth every morning.   Vitamin D3 50 MCG (2000 UT) Tabs Take 2,000 Units by mouth daily.      Allergies  Allergen Reactions  . Tape Other (See Comments)    Patient prefers easy-release tape   Follow-up Information    Dunn, Dayna N, PA-C Follow up.   Specialties: Cardiology, Radiology Why: [place] Office - 7/22 at 1pm  as below. Please arrive 15 minutes prior to check in. Contact information: [address] [place] [address] [phone]        Lawerance Cruel, MD. Schedule an appointment as soon as possible for a visit in 2 week(s).   Specialty: Family Medicine Why: f/u in 2 weeks. Contact information: [address] [place] [address] [phone]        Jerline Pain, MD .   Specialty: Cardiology Contact information: [phone] [place] [address] [phone]        [facility] Follow up.   Why: Home Health Physical Therapy- will schedule time to work with patient.  Contact information: [place] [facility] [phone]           The results of significant diagnostics from this hospitalization (including imaging, microbiology, ancillary and laboratory) are listed below for reference.    Significant Diagnostic Studies: US Renal  Result Date: 12/13/2018 CLINICAL DATA:  Acute renal failure. EXAM: RENAL / URINARY TRACT ULTRASOUND COMPLETE COMPARISON:  Ultrasound 12/11/2014 FINDINGS: Right Kidney: Renal measurements: 8.6 x 4.9 x 4.8 cm = volume: 105 mL. Normal echogenicity. Cortical thinning throughout  the right kidney without hydronephrosis. No suspicious renal lesion. Left Kidney: Renal measurements: 8.6 x 4.4 x 4.7 cm = volume: 92 mL. Normal echogenicity. Cortical thinning without hydronephrosis. No suspicious renal lesion. Bladder: Appears normal for degree of bladder distention. IMPRESSION: 1. Negative for hydronephrosis. 2. Cortical thinning in both kidneys. Electronically Signed   By: Markus Daft M.D.   On: 12/13/2018 12:15   Dg Chest Port 1 View  Result Date: 12/13/2018 CLINICAL DATA:  Shortness of breath EXAM: PORTABLE CHEST 1 VIEW COMPARISON:  December 12, 2018. FINDINGS: There is no appreciable edema or consolidation. There is slight left base atelectasis. Heart is mildly enlarged with pulmonary vascularity normal. No adenopathy. There is aortic atherosclerosis. Bones are osteoporotic. IMPRESSION: Mild left base atelectasis. No edema or consolidation. Heart mildly enlarged. Aortic Atherosclerosis (ICD10-I70.0). Bones osteoporotic. Electronically Signed   By: Lowella Grip III M.D.   On: 12/13/2018 08:59   Dg Chest Port 1 View  Result Date: 12/12/2018 CLINICAL DATA:  Rapid and irregular heart rate EXAM: PORTABLE CHEST 1 VIEW COMPARISON:  Chest x-rays dated 05/23/2018 and 02/01/2016. FINDINGS: Heart size and mediastinal contours are stable. Central parabronchial thickening/edema bilaterally. Probable cephalization. No confluent opacity to suggest consolidating pneumonia. No pleural effusion or pneumothorax seen. Osseous structures about the chest are unremarkable. IMPRESSION: Acute bronchitic change. This could represent mild CHF or atypical/viral pneumonia. No evidence of consolidating pneumonia or overt alveolar pulmonary edema. Electronically Signed   By: Franki Cabot M.D.   On: 12/12/2018 13:50    Microbiology: Recent Results (from the past 240 hour(s))  SARS Coronavirus 2 (CEPHEID - Performed in [place] hospital lab), Hosp Order     Status: None   Collection Time: 12/12/18  1:49 PM    Specimen: Nasopharyngeal Swab  Result Value Ref Range Status   SARS Coronavirus 2 NEGATIVE NEGATIVE Final    Comment: (NOTE) If result is NEGATIVE SARS-CoV-2 target nucleic acids are NOT DETECTED. The SARS-CoV-2 RNA is generally detectable in upper and lower  respiratory specimens during the acute phase of infection. The lowest  concentration of SARS-CoV-2 viral copies this assay can detect is 250  copies / mL. A negative result does not preclude SARS-CoV-2 infection  and should not be used as the sole basis for treatment or  other  patient management decisions.  A negative result may occur with  improper specimen collection / handling, submission of specimen other  than nasopharyngeal swab, presence of viral mutation(s) within the  areas targeted by this assay, and inadequate number of viral copies  (<250 copies / mL). A negative result must be combined with clinical  observations, patient history, and epidemiological information. If result is POSITIVE SARS-CoV-2 target nucleic acids are DETECTED. The SARS-CoV-2 RNA is generally detectable in upper and lower  respiratory specimens dur ing the acute phase of infection.  Positive  results are indicative of active infection with SARS-CoV-2.  Clinical  correlation with patient history and other diagnostic information is  necessary to determine patient infection status.  Positive results do  not rule out bacterial infection or co-infection with other viruses. If result is PRESUMPTIVE POSTIVE SARS-CoV-2 nucleic acids MAY BE PRESENT.   A presumptive positive result was obtained on the submitted specimen  and confirmed on repeat testing.  While 2019 novel coronavirus  (SARS-CoV-2) nucleic acids may be present in the submitted sample  additional confirmatory testing may be necessary for epidemiological  and / or clinical management purposes  to differentiate between  SARS-CoV-2 and other Sarbecovirus currently known to infect humans.  If  clinically indicated additional testing with an alternate test  methodology [phone]) is advised. The SARS-CoV-2 RNA is generally  detectable in upper and lower respiratory sp ecimens during the acute  phase of infection. The expected result is Negative. Fact Sheet for Patients:  StrictlyIdeas.no Fact Sheet for Healthcare Providers: BankingDealers.co.za This test is not yet approved or cleared by the [place] FDA and has been authorized for detection and/or diagnosis of SARS-CoV-2 by FDA under an Emergency Use Authorization (EUA).  This EUA will remain in effect (meaning this test can be used) for the duration of the COVID-19 declaration under Section 564(b)(1) of the Act, 21 U.S.C. section 360bbb-3(b)(1), unless the authorization is terminated or revoked sooner. Performed at [facility], [place] [address] [place], [place] [address]   Urine Culture     Status: Abnormal   Collection Time: 12/12/18  8:20 PM   Specimen: Urine, Clean Catch  Result Value Ref Range Status   Specimen Description URINE, CLEAN CATCH  Final   Special Requests   Final    NONE Performed at [facility], [place] [address], [place], [place] [address]    Culture MULTIPLE SPECIES PRESENT, SUGGEST RECOLLECTION (A)  Final   Report Status 12/13/2018 FINAL  Final  MRSA PCR Screening     Status: None   Collection Time: 12/12/18  8:42 PM   Specimen: Nasal Mucosa; Nasopharyngeal  Result Value Ref Range Status   MRSA by PCR NEGATIVE NEGATIVE Final    Comment:        The GeneXpert MRSA Assay (FDA approved for NASAL specimens only), is one component of a comprehensive MRSA colonization surveillance program. It is not intended to diagnose MRSA infection nor to guide or monitor treatment for MRSA infections. Performed at [facility], [place] [address], [place], [place] [address]      Labs: Basic Metabolic Panel: Recent Labs  Lab 12/13/18 0500  12/14/18 0344 12/15/18 0350 12/16/18 0544 12/17/18 0716  NA 140 136 135 138 138  K 3.4* 3.6 4.1 4.2 3.9  CL 104 102 104 107 105  CO2 22 19* 21* 23 23  GLUCOSE 101* 111* 117* 100* 99  BUN 48* 52* 54* 47* 45*  CREATININE  2.20* 2.14* 2.31* 2.12* 2.16*  CALCIUM 8.7* 8.6* 8.8* 8.7* 8.7*  MG 2.0 2.1  --   --   --    Liver Function Tests: Recent Labs  Lab 12/12/18 1225  AST 31  ALT 21  ALKPHOS 74  BILITOT 1.1  PROT 6.4*  ALBUMIN 3.1*   No results for input(s): LIPASE, AMYLASE in the last 168 hours. No results for input(s): AMMONIA in the last 168 hours. CBC: Recent Labs  Lab 12/12/18 1225 12/13/18 0500 12/14/18 0344 12/15/18 0350 12/16/18 0544 12/17/18 0716  WBC 13.0* 10.5 10.1 11.3* 10.1  --   NEUTROABS 8.6*  --   --   --   --   --   HGB 13.0 12.1 11.7* 10.8* 10.8* 10.8*  HCT 38.6 35.7* 34.3* 32.0* 32.4* 32.3*  MCV 94.1 93.5 93.7 93.3 94.2  --   PLT 175 159 160 147* 147*  --    Cardiac Enzymes: No results for input(s): CKTOTAL, CKMB, CKMBINDEX, TROPONINI in the last 168 hours. BNP: BNP (last 3 results) Recent Labs    05/23/18 1608 12/12/18 1225  BNP 146.6* 899.9*    ProBNP (last 3 results) No results for input(s): PROBNP in the last 8760 hours.  CBG: No results for input(s): GLUCAP in the last 168 hours.     Signed:  Irine Seal MD.  [facility] 12/17/2018, 5:00 PM

## 2018-12-23 ENCOUNTER — Encounter: Payer: Self-pay | Admitting: Physician Assistant

## 2018-12-23 NOTE — Progress Notes (Signed)
Cardiology Office Note    Date:  12/25/2018  ID:  Cassandra Ray, Cassandra Ray April 19, 1923, MRN [id number] PCP:  Lawerance Cruel, MD  Cardiologist:  Candee Furbish, MD   Chief Complaint: f/u afib  History of Present Illness:  Cassandra Ray is a 85 y.o. female with history of HTN, hypothyroidism, breast CA, GERD, diastolic dysfunction/probable chronic diastolic CHF, chronic hyponatremia, pancreatic cyst, prior pleural effusion, CKD stage III-IV, paorxysmal atrial tachycardia, paroxysmal atrial fibrillation who presents for post-hospital follow-up. She had questionable remote hx of AF, then atrial tachycardia 05/2018 in which she spontaneously converted to NSR. She was readmitted in 12/2018 with generalized weakness/lethargy and found to have AKI on CKD (Cr usually around 1.5 previously), suppressed TSH, hypoalbuminemia, leukocytosis, mildly abnormal hsTroponin, and CXR with mild CHF vs atypical/viral PNA. Cardiology followed for rapid atrial fibrillation. She was started on diltiazem + amio initially, then diltiazem was transitioned back to home metoprolol but at lower dose. Losartan was discontinued. She was treated with pulm hygiene. Levothyroxine was reduced. Her elevated troponin was low/flat (67->60) not felt to represent ACS. Last labs 7/14 showed K 3.9, Cr 2.16, Hgb 10.8, plt 147, Mg 2.1.  She returns for follow-up today feeling well. She is feeling better every day. She denies any CP, SOB, palpitations, dizziness, bleeding or syncope. Her edema seems well controlled. She has f/u with PCP coming up in a week. She lives in [place] independent living with husband Rush Landmark. He recently googled apixaban and read it could potentially interact with cranberries. I could find no such interaction (confirmed with pharmD), but did relay potential interaction with grapefruit thus she is asked to avoid this fruit.   Past Medical History:  Diagnosis Date  . Actinic keratoses   . Arthritis    back   . Breast  cancer ([place])    Breast cancer (right)  . Chronic diastolic CHF (congestive heart failure) ([place])   . CKD (chronic kidney disease), stage III ([place])   . Compression fracture of L3 lumbar vertebra   . Dysphagia   . GERD (gastroesophageal reflux disease)    takes otc Pepcid as needed  . Hiatal hernia 11/27/2014  . HTN (hypertension)   . Hypothyroidism   . Insomnia   . Osteopenia   . PAF (paroxysmal atrial fibrillation) ([place])    a. questionable episode in 2015 in [facility]. b. event monitor through December/January 2014/2015 which showed no evidence of atrial fibrillation. c. Dx 12/2018.  Marland Kitchen PAT (paroxysmal atrial tachycardia) ([place])   . Pleural effusion     Past Surgical History:  Procedure Laterality Date  . BREAST LUMPECTOMY Right 1995   lumpectomy  . COLONOSCOPY    . EYE SURGERY Bilateral    cataract w/ lens implant  . KYPHOPLASTY N/A 09/18/2013   Procedure: KYPHOPLASTY;  Surgeon: Sinclair Ship, MD;  Location: [facility];  Service: Orthopedics;  Laterality: N/A;  Lumbar 3 kyphoplasty  . TONSILLECTOMY      Current Medications: Current Meds  Medication Sig  . acetaminophen (TYLENOL) 500 MG tablet Take 500-1,000 mg by mouth every 8 (eight) hours as needed for mild pain or headache.  Marland Kitchen amiodarone (PACERONE) 200 MG tablet Take 1 tablet (200 mg total) by mouth daily.  Marland Kitchen apixaban (ELIQUIS) 2.5 MG TABS tablet Take 1 tablet (2.5 mg total) by mouth 2 (two) times daily.  . B Complex-C (B-COMPLEX WITH VITAMIN C) tablet Take 1 tablet by mouth daily.  . Calcium-Phosphorus-Vitamin D (CITRACAL +D3 PO) Take 1 tablet by mouth  daily.  . Cholecalciferol (VITAMIN D3) 2000 units TABS Take 2,000 Units by mouth daily.   . famotidine (PEPCID) 40 MG/5ML suspension Take 2.5 mLs (20 mg total) by mouth daily.  . furosemide (LASIX) 40 MG tablet Take 1 tablet (40 mg total) by mouth daily.  . Glucosamine HCl 1500 MG TABS Take 3,000 mg by mouth at bedtime.   . levalbuterol (XOPENEX HFA) 45 MCG/ACT inhaler Inhale 2  puffs into the lungs every 4 (four) hours as needed for wheezing.  Marland Kitchen levothyroxine (SYNTHROID) 100 MCG tablet Take 1 tablet (100 mcg total) by mouth daily before breakfast.  . lipase/protease/amylase (CREON) 36000 UNITS CPEP capsule Take 72,000 Units by mouth 3 (three) times daily.   Marland Kitchen loperamide (IMODIUM A-D) 2 MG tablet Take 2 mg by mouth as needed for diarrhea or loose stools.  . metoprolol tartrate (LOPRESSOR) 25 MG tablet Take 0.5 tablets (12.5 mg total) by mouth 2 (two) times daily.  . nitroGLYCERIN (NITROSTAT) 0.4 MG SL tablet Place 0.4 mg under the tongue every 5 (five) minutes as needed for chest pain.  . Probiotic Product (ALIGN) 4 MG CAPS Take 4 mg by mouth daily.  . SYMBICORT 80-4.5 MCG/ACT inhaler Take 2 puffs by mouth every morning.   . [DISCONTINUED] amiodarone (PACERONE) 200 MG tablet Take 1 tablet (200 mg total) by mouth daily.  . [DISCONTINUED] apixaban (ELIQUIS) 2.5 MG TABS tablet Take 1 tablet (2.5 mg total) by mouth 2 (two) times daily.  . [DISCONTINUED] metoprolol tartrate (LOPRESSOR) 25 MG tablet Take 0.5 tablets (12.5 mg total) by mouth 2 (two) times daily.     Allergies:   Tape   Social History   Socioeconomic History  . Marital status: Married    Spouse name: Not on file  . Number of children: Not on file  . Years of education: Not on file  . Highest education level: Not on file  Occupational History  . Not on file  Social Needs  . Financial resource strain: Not on file  . Food insecurity    Worry: Not on file    Inability: Not on file  . Transportation needs    Medical: Not on file    Non-medical: Not on file  Tobacco Use  . Smoking status: Former Research scientist (life sciences)  . Smokeless tobacco: Never Used  . Tobacco comment: only in college  Substance and Sexual Activity  . Alcohol use: Yes    Alcohol/week: 7.0 standard drinks    Types: 7 Glasses of wine per week    Comment: 1 glass of wine a day  . Drug use: No  . Sexual activity: Not on file  Lifestyle  .  Physical activity    Days per week: Not on file    Minutes per session: Not on file  . Stress: Not on file  Relationships  . Social Herbalist on phone: Not on file    Gets together: Not on file    Attends religious service: Not on file    Active member of club or organization: Not on file    Attends meetings of clubs or organizations: Not on file    Relationship status: Not on file  Other Topics Concern  . Not on file  Social History Narrative  . Not on file     Family History:  The patient's family history includes CAD in her mother; Diabetes Mellitus II in her mother; Heart disease in her sister; Heart failure in her father and mother; Kidney cancer  in her brother; Prostate cancer in her brother. There is no history of Esophageal cancer or Colon cancer.  ROS:   Please see the history of present illness.  All other systems are reviewed and otherwise negative.    PHYSICAL EXAM:   VS:  BP 118/64   Pulse 64   Ht 5\' 4"  (1.626 m)   Wt 169 lb 12.8 oz (77 kg)   LMP  (LMP Unknown)   SpO2 97%   BMI 29.15 kg/m   BMI: Body mass index is 29.15 kg/m. GEN: Well nourished, well developed WF in no acute distress HEENT: normocephalic, atraumatic Neck: no JVD, carotid bruits, or masses Cardiac: RRR; no murmurs, rubs, or gallops, trace sockline edema  Respiratory:  clear to auscultation bilaterally, normal work of breathing GI: soft, nontender, nondistended, + BS MS: no deformity or atrophy Skin: warm and dry, no rash Neuro:  Alert and Oriented x 3, Strength and sensation are intact, follows commands Psych: euthymic mood, full affect  Wt Readings from Last 3 Encounters:  12/25/18 169 lb 12.8 oz (77 kg)  12/17/18 173 lb 1 oz (78.5 kg)  06/26/18 173 lb 12.8 oz (78.8 kg)      Studies/Labs Reviewed:   EKG:  EKG was ordered today and personally reviewed by me and demonstrates NSR with first degree AVB 64bpm, low voltage QRS, prior inferior infarct, no acute changes.   Recent Labs: 12/12/2018: ALT 21; B Natriuretic Peptide 899.9; TSH 0.147 12/14/2018: Magnesium 2.1 12/16/2018: Platelets 147 12/17/2018: BUN 45; Creatinine, Ser 2.16; Hemoglobin 10.8; Potassium 3.9; Sodium 138   Lipid Panel    Component Value Date/Time   CHOL 128 12/13/2018 0500   TRIG 63 12/13/2018 0500   HDL 79 12/13/2018 0500   CHOLHDL 1.6 12/13/2018 0500   VLDL 13 12/13/2018 0500   LDLCALC 36 12/13/2018 0500    Additional studies/ records that were reviewed today include: Summarized above    ASSESSMENT & PLAN:   1. Paroxysmal atrial fibrillation/paroxysmal atrial tachycardia - EKG today is stable, NSR, first degree AVB, QTc 43ms. Will f/u CBC and BMET today given recent abnormal parameters while admitted. Bleeding precautions reviewed. Further monitoring of organ systems while on amiodarone will be at discretion of primary cardiologist. 2. Hypothyroidism - she plans to see PCP next week. She will need f/u TFTs in a few weeks s/p med adjustment (mid-Aug or so). I wrote a reminder on her AVS to show Dr. Harrington Challenger at her visit. 3. Chronic diastolic CHF - appears euvolemic. Continue present regimen. F/u BMET today. 4. CKD stage III with recent AKI - f/u Cr today.  Disposition: F/u with Dr. [name] in 3-4 months.  Medication Adjustments/Labs and Tests Ordered: Current medicines are reviewed at length with the patient today.  Concerns regarding medicines are outlined above. Medication changes, Labs and Tests ordered today are summarized above and listed in the Patient Instructions accessible in Encounters.   Signed, Charlie Pitter, PA-C  12/25/2018 1:54 PM    [facility] [place], [place],   [address] Phone: [phone]; Fax: [phone]

## 2018-12-25 ENCOUNTER — Telehealth: Payer: Self-pay | Admitting: Physician Assistant

## 2018-12-25 ENCOUNTER — Other Ambulatory Visit: Payer: Self-pay

## 2018-12-25 ENCOUNTER — Encounter: Payer: Self-pay | Admitting: Physician Assistant

## 2018-12-25 ENCOUNTER — Ambulatory Visit (INDEPENDENT_AMBULATORY_CARE_PROVIDER_SITE_OTHER): Payer: Medicare Other | Admitting: Physician Assistant

## 2018-12-25 VITALS — BP 118/64 | HR 64 | Ht 64.0 in | Wt 169.8 lb

## 2018-12-25 DIAGNOSIS — N183 Chronic kidney disease, stage 3 unspecified: Secondary | ICD-10-CM

## 2018-12-25 DIAGNOSIS — I480 Paroxysmal atrial fibrillation: Secondary | ICD-10-CM

## 2018-12-25 DIAGNOSIS — E039 Hypothyroidism, unspecified: Secondary | ICD-10-CM

## 2018-12-25 DIAGNOSIS — I5032 Chronic diastolic (congestive) heart failure: Secondary | ICD-10-CM

## 2018-12-25 MED ORDER — METOPROLOL TARTRATE 25 MG PO TABS: 12.5000 mg | ORAL_TABLET | Freq: Two times a day (BID) | ORAL | 3 refills | Status: DC

## 2018-12-25 MED ORDER — FUROSEMIDE 40 MG PO TABS: 40.0000 mg | ORAL_TABLET | Freq: Every day | ORAL | 3 refills | Status: DC

## 2018-12-25 MED ORDER — APIXABAN 2.5 MG PO TABS: 2.5000 mg | ORAL_TABLET | Freq: Two times a day (BID) | ORAL | 3 refills | Status: DC

## 2018-12-25 MED ORDER — AMIODARONE HCL 200 MG PO TABS: 200.0000 mg | ORAL_TABLET | Freq: Every day | ORAL | 3 refills | Status: DC

## 2018-12-25 NOTE — Patient Instructions (Addendum)
Medication Instructions:  Your physician recommends that you continue on your current medications as directed. Please refer to the Current Medication list given to you today.  If you need a refill on your cardiac medications before your next appointment, please call your pharmacy.   Lab work: TODAY:  BMET & CBC  If you have labs (blood work) drawn today and your tests are completely normal, you will receive your results only by: Marland Kitchen MyChart Message (if you have MyChart) OR . A paper copy in the mail If you have any lab test that is abnormal or we need to change your treatment, we will call you to review the results.  Testing/Procedures: None ordered  Follow-Up: At [facility], you and your health needs are our priority.  As part of our continuing mission to provide you with exceptional heart care, we have created designated Provider Care Teams.  These Care Teams include your primary Cardiologist (physician) and Advanced Practice Providers (APPs -  Physician Assistants and Nurse Practitioners) who all work together to provide you with the care you need, when you need it. You will need a follow up appointment in 4 months.  Please call our office 2 months in advance to schedule this appointment.  You may see Candee Furbish, MD or one of the following Advanced Practice Providers on your designated Care Team:   Truitt Merle, NP Cecilie Kicks, NP . Kathyrn Drown, NP  Any Other Special Instructions Will Be Listed Below (If Applicable).  When you were in the hospital, your TSH was suppressed at 0.147 and free T4 was elevated at 1.57. The internal medicine team decreased your thyroid medicine. You should have this level rechecked in a couple weeks at the discretion of your primary care provider.   If you notice any bleeding such as blood in stool, black tarry stools, blood in urine, nosebleeds or any other unusual bleeding, call your doctor immediately. It is not normal to have this kind of bleeding  while on a blood thinner and usually indicates there is an underlying problem with one of your body systems that needs to be checked out.

## 2018-12-25 NOTE — Telephone Encounter (Signed)

## 2018-12-26 LAB — BASIC METABOLIC PANEL
BUN/Creatinine Ratio: 17 (ref 12–28)
BUN: 32 mg/dL (ref 10–36)
CO2: 22 mmol/L (ref 20–29)
Calcium: 9.6 mg/dL (ref 8.7–10.3)
Chloride: 100 mmol/L (ref 96–106)
Creatinine, Ser: 1.86 mg/dL — ABNORMAL HIGH (ref 0.57–1.00)
GFR calc Af Amer: 27 mL/min/{1.73_m2} — ABNORMAL LOW (ref >59)
GFR calc non Af Amer: 23 mL/min/{1.73_m2} — ABNORMAL LOW (ref >59)
Glucose: 98 mg/dL (ref 65–99)
Potassium: 4.5 mmol/L (ref 3.5–5.2)
Sodium: 139 mmol/L (ref 134–144)

## 2018-12-26 LAB — CBC
Hematocrit: 37.3 % (ref 34.0–46.6)
Hemoglobin: 12.7 g/dL (ref 11.1–15.9)
MCH: 31.7 pg (ref 26.6–33.0)
MCHC: 34 g/dL (ref 31.5–35.7)
MCV: 93 fL (ref 79–97)
Platelets: 250 10*3/uL (ref 150–450)
RBC: 4.01 x10E6/uL (ref 3.77–5.28)
RDW: 12.5 % (ref 11.7–15.4)
WBC: 6.9 10*3/uL (ref 3.4–10.8)

## 2018-12-26 NOTE — Progress Notes (Signed)
Pt made aware of her lab results, and to continue off the Losartan. Pt very grateful for the report.

## 2018-12-30 ENCOUNTER — Other Ambulatory Visit: Payer: Self-pay | Admitting: Physician Assistant

## 2019-01-08 ENCOUNTER — Inpatient Hospital Stay (HOSPITAL_COMMUNITY)
Admission: EM | Admit: 2019-01-08 | Discharge: 2019-01-10 | DRG: 377 | Disposition: A | Discharge status: Home / Self Care | Payer: Medicare Other | Attending: Internal Medicine | Admitting: Internal Medicine

## 2019-01-08 ENCOUNTER — Emergency Department (HOSPITAL_COMMUNITY): Payer: Medicare Other

## 2019-01-08 ENCOUNTER — Other Ambulatory Visit: Payer: Self-pay

## 2019-01-08 ENCOUNTER — Telehealth: Payer: Self-pay | Admitting: Cardiology

## 2019-01-08 ENCOUNTER — Encounter (HOSPITAL_COMMUNITY): Payer: Self-pay | Admitting: *Deleted

## 2019-01-08 DIAGNOSIS — Z8051 Family history of malignant neoplasm of kidney: Secondary | ICD-10-CM

## 2019-01-08 DIAGNOSIS — Z91048 Other nonmedicinal substance allergy status: Secondary | ICD-10-CM | POA: Diagnosis not present

## 2019-01-08 DIAGNOSIS — E039 Hypothyroidism, unspecified: Secondary | ICD-10-CM | POA: Diagnosis present

## 2019-01-08 DIAGNOSIS — I130 Hypertensive heart and chronic kidney disease with heart failure and stage 1 through stage 4 chronic kidney disease, or unspecified chronic kidney disease: Secondary | ICD-10-CM | POA: Diagnosis present

## 2019-01-08 DIAGNOSIS — K921 Melena: Secondary | ICD-10-CM | POA: Diagnosis not present

## 2019-01-08 DIAGNOSIS — N183 Chronic kidney disease, stage 3 (moderate): Secondary | ICD-10-CM | POA: Diagnosis present

## 2019-01-08 DIAGNOSIS — K219 Gastro-esophageal reflux disease without esophagitis: Secondary | ICD-10-CM | POA: Diagnosis present

## 2019-01-08 DIAGNOSIS — M1990 Unspecified osteoarthritis, unspecified site: Secondary | ICD-10-CM | POA: Diagnosis present

## 2019-01-08 DIAGNOSIS — D649 Anemia, unspecified: Secondary | ICD-10-CM | POA: Diagnosis present

## 2019-01-08 DIAGNOSIS — Z9842 Cataract extraction status, left eye: Secondary | ICD-10-CM | POA: Diagnosis not present

## 2019-01-08 DIAGNOSIS — E876 Hypokalemia: Secondary | ICD-10-CM | POA: Diagnosis present

## 2019-01-08 DIAGNOSIS — Z8249 Family history of ischemic heart disease and other diseases of the circulatory system: Secondary | ICD-10-CM

## 2019-01-08 DIAGNOSIS — Z7901 Long term (current) use of anticoagulants: Secondary | ICD-10-CM

## 2019-01-08 DIAGNOSIS — I248 Other forms of acute ischemic heart disease: Secondary | ICD-10-CM | POA: Diagnosis present

## 2019-01-08 DIAGNOSIS — Z79890 Hormone replacement therapy (postmenopausal): Secondary | ICD-10-CM | POA: Diagnosis not present

## 2019-01-08 DIAGNOSIS — Z87891 Personal history of nicotine dependence: Secondary | ICD-10-CM | POA: Diagnosis not present

## 2019-01-08 DIAGNOSIS — Z961 Presence of intraocular lens: Secondary | ICD-10-CM | POA: Diagnosis present

## 2019-01-08 DIAGNOSIS — K922 Gastrointestinal hemorrhage, unspecified: Secondary | ICD-10-CM

## 2019-01-08 DIAGNOSIS — I5033 Acute on chronic diastolic (congestive) heart failure: Secondary | ICD-10-CM | POA: Diagnosis not present

## 2019-01-08 DIAGNOSIS — I4891 Unspecified atrial fibrillation: Secondary | ICD-10-CM | POA: Diagnosis present

## 2019-01-08 DIAGNOSIS — G4700 Insomnia, unspecified: Secondary | ICD-10-CM | POA: Diagnosis present

## 2019-01-08 DIAGNOSIS — I480 Paroxysmal atrial fibrillation: Secondary | ICD-10-CM | POA: Diagnosis present

## 2019-01-08 DIAGNOSIS — K449 Diaphragmatic hernia without obstruction or gangrene: Secondary | ICD-10-CM | POA: Diagnosis present

## 2019-01-08 DIAGNOSIS — Z79899 Other long term (current) drug therapy: Secondary | ICD-10-CM | POA: Diagnosis not present

## 2019-01-08 DIAGNOSIS — Z66 Do not resuscitate: Secondary | ICD-10-CM | POA: Diagnosis present

## 2019-01-08 DIAGNOSIS — K254 Chronic or unspecified gastric ulcer with hemorrhage: Secondary | ICD-10-CM | POA: Diagnosis present

## 2019-01-08 DIAGNOSIS — I5043 Acute on chronic combined systolic (congestive) and diastolic (congestive) heart failure: Secondary | ICD-10-CM | POA: Diagnosis present

## 2019-01-08 DIAGNOSIS — I10 Essential (primary) hypertension: Secondary | ICD-10-CM | POA: Diagnosis present

## 2019-01-08 DIAGNOSIS — Z853 Personal history of malignant neoplasm of breast: Secondary | ICD-10-CM | POA: Diagnosis not present

## 2019-01-08 DIAGNOSIS — Z9841 Cataract extraction status, right eye: Secondary | ICD-10-CM

## 2019-01-08 DIAGNOSIS — Z20828 Contact with and (suspected) exposure to other viral communicable diseases: Secondary | ICD-10-CM | POA: Diagnosis present

## 2019-01-08 DIAGNOSIS — Z8042 Family history of malignant neoplasm of prostate: Secondary | ICD-10-CM

## 2019-01-08 LAB — CBC WITH DIFFERENTIAL/PLATELET
Abs Immature Granulocytes: 0.04 10*3/uL (ref 0.00–0.07)
Basophils Absolute: 0.1 10*3/uL (ref 0.0–0.1)
Basophils Relative: 1 %
Eosinophils Absolute: 0.3 10*3/uL (ref 0.0–0.5)
Eosinophils Relative: 3 %
HCT: 36.1 % (ref 36.0–46.0)
Hemoglobin: 11.4 g/dL — ABNORMAL LOW (ref 12.0–15.0)
Immature Granulocytes: 0 %
Lymphocytes Relative: 28 %
Lymphs Abs: 2.6 10*3/uL (ref 0.7–4.0)
MCH: 31.2 pg (ref 26.0–34.0)
MCHC: 31.6 g/dL (ref 30.0–36.0)
MCV: 98.9 fL (ref 80.0–100.0)
Monocytes Absolute: 1.3 10*3/uL — ABNORMAL HIGH (ref 0.1–1.0)
Monocytes Relative: 15 %
Neutro Abs: 4.8 10*3/uL (ref 1.7–7.7)
Neutrophils Relative %: 53 %
Platelets: 172 10*3/uL (ref 150–400)
RBC: 3.65 MIL/uL — ABNORMAL LOW (ref 3.87–5.11)
RDW: 13.6 % (ref 11.5–15.5)
WBC: 9.1 10*3/uL (ref 4.0–10.5)
nRBC: 0 % (ref 0.0–0.2)

## 2019-01-08 LAB — COMPREHENSIVE METABOLIC PANEL
ALT: 16 U/L (ref 0–44)
AST: 25 U/L (ref 15–41)
Albumin: 3.5 g/dL (ref 3.5–5.0)
Alkaline Phosphatase: 67 U/L (ref 38–126)
Anion gap: 13 (ref 5–15)
BUN: 49 mg/dL — ABNORMAL HIGH (ref 8–23)
CO2: 25 mmol/L (ref 22–32)
Calcium: 9.4 mg/dL (ref 8.9–10.3)
Chloride: 102 mmol/L (ref 98–111)
Creatinine, Ser: 1.7 mg/dL — ABNORMAL HIGH (ref 0.44–1.00)
GFR calc Af Amer: 30 mL/min — ABNORMAL LOW (ref >60)
GFR calc non Af Amer: 26 mL/min — ABNORMAL LOW (ref >60)
Glucose, Bld: 104 mg/dL — ABNORMAL HIGH (ref 70–99)
Potassium: 3.4 mmol/L — ABNORMAL LOW (ref 3.5–5.1)
Sodium: 140 mmol/L (ref 135–145)
Total Bilirubin: 0.5 mg/dL (ref 0.3–1.2)
Total Protein: 6.8 g/dL (ref 6.5–8.1)

## 2019-01-08 LAB — TYPE AND SCREEN
ABO/RH(D): O POS
Antibody Screen: NEGATIVE

## 2019-01-08 LAB — PROTIME-INR
INR: 1.3 — ABNORMAL HIGH (ref 0.8–1.2)
Prothrombin Time: 15.9 seconds — ABNORMAL HIGH (ref 11.4–15.2)

## 2019-01-08 LAB — TROPONIN I (HIGH SENSITIVITY)
Troponin I (High Sensitivity): 6 ng/L (ref <18)
Troponin I (High Sensitivity): 6 ng/L (ref <18)

## 2019-01-08 LAB — SARS CORONAVIRUS 2 BY RT PCR (HOSPITAL ORDER, PERFORMED IN ~~LOC~~ HOSPITAL LAB): SARS Coronavirus 2: NEGATIVE

## 2019-01-08 LAB — POC OCCULT BLOOD, ED: Fecal Occult Bld: POSITIVE — AB

## 2019-01-08 IMAGING — DX PORTABLE CHEST - 1 VIEW
1 series · 1 of 1 positions shown · non-contrast
Comparison: Numerous priors, most recently [DATE]

CLINICAL DATA: Started Eliquis 3 weeks prior, black stools last
night. History of breast cancer

EXAM:
PORTABLE CHEST 1 VIEW

[chest ap]
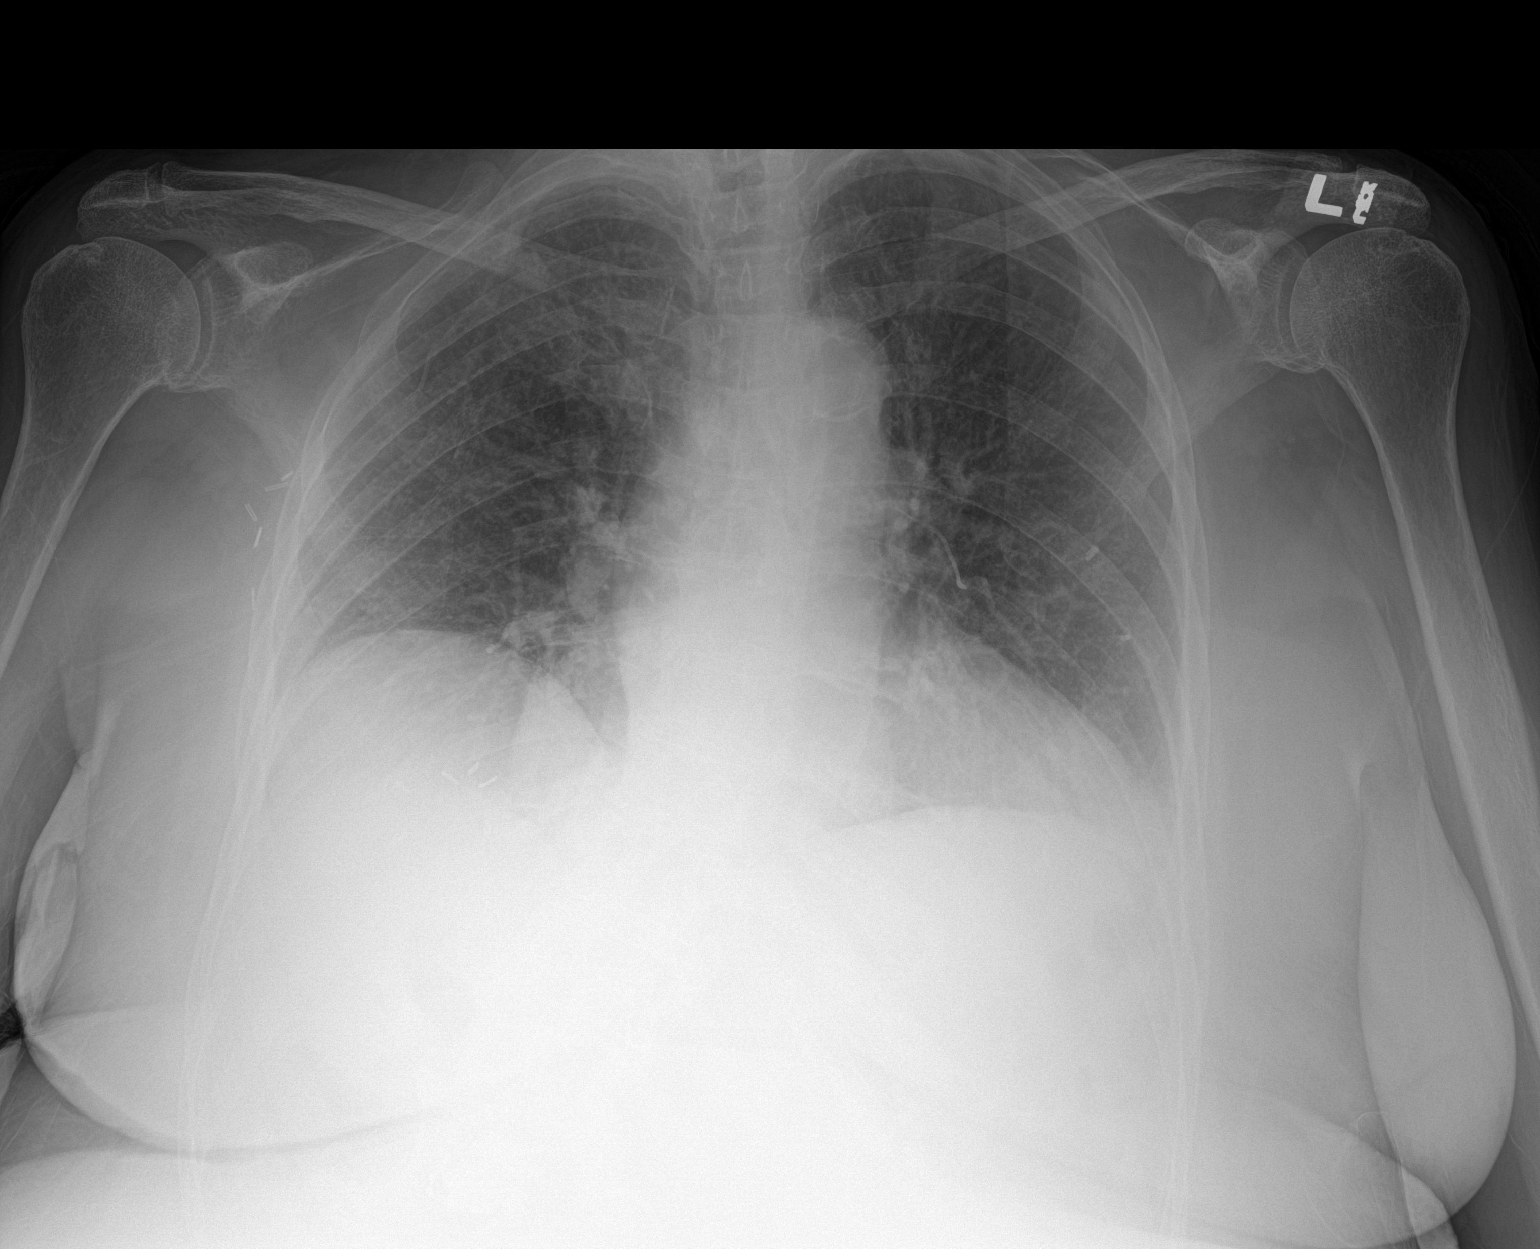

[1 of 1 positions shown; findings below may reference images not displayed]

FINDINGS: Lung volumes are diminished with hazy bibasilar opacities likely
reflecting areas of atelectasis. No focal consolidation. No
pneumothorax or effusion. There is chronic elevation of the right
hemidiaphragm. Multiple surgical clips project over the right axilla
and right chest. Cardiac silhouette remains enlarged with central
venous prominence and cephalized, indistinct vascularity. The aorta
is calcified and tortuous. Degenerative changes are present in the
imaged spine and shoulders. No acute osseous or soft tissue
abnormality.
IMPRESSION: Cardiomegaly with features of mild interstitial edema on a
background of low volume.

## 2019-01-08 MED ORDER — METOPROLOL TARTRATE 25 MG PO TABS
12.5000 mg | ORAL_TABLET | Freq: Two times a day (BID) | ORAL | Status: DC
  Administered 2019-01-09 – 2019-01-10 (×3): 12.5 mg via ORAL
  Filled 2019-01-08 (×3): qty 1

## 2019-01-08 MED ORDER — ONDANSETRON HCL 4 MG/2ML IJ SOLN: 4.0000 mg | Freq: Four times a day (QID) | INTRAMUSCULAR | Status: DC | PRN

## 2019-01-08 MED ORDER — ACETAMINOPHEN 650 MG RE SUPP: 650.0000 mg | Freq: Four times a day (QID) | RECTAL | Status: DC | PRN

## 2019-01-08 MED ORDER — SODIUM CHLORIDE 0.9 % IV SOLN
INTRAVENOUS | Status: DC
  Administered 2019-01-08: 19:00:00 via INTRAVENOUS

## 2019-01-08 MED ORDER — SODIUM CHLORIDE 0.9 % IV SOLN: 8.0000 mg/h | INTRAVENOUS | Status: DC

## 2019-01-08 MED ORDER — LEVOTHYROXINE SODIUM 100 MCG PO TABS
100.0000 ug | ORAL_TABLET | Freq: Every day | ORAL | Status: DC
  Administered 2019-01-09 – 2019-01-10 (×2): 100 ug via ORAL
  Filled 2019-01-08 (×2): qty 1

## 2019-01-08 MED ORDER — SODIUM CHLORIDE 0.9 % IV SOLN: INTRAVENOUS | Status: AC

## 2019-01-08 MED ORDER — ONDANSETRON HCL 4 MG PO TABS: 4.0000 mg | ORAL_TABLET | Freq: Four times a day (QID) | ORAL | Status: DC | PRN

## 2019-01-08 MED ORDER — SODIUM CHLORIDE 0.9 % IV SOLN
80.0000 mg | Freq: Once | INTRAVENOUS | Status: AC
  Administered 2019-01-08: 80 mg via INTRAVENOUS
  Filled 2019-01-08: qty 80

## 2019-01-08 MED ORDER — POTASSIUM CHLORIDE 10 MEQ/100ML IV SOLN
10.0000 meq | INTRAVENOUS | Status: AC
  Administered 2019-01-08 (×2): 10 meq via INTRAVENOUS
  Filled 2019-01-08 (×2): qty 100

## 2019-01-08 MED ORDER — PANCRELIPASE (LIP-PROT-AMYL) 36000-114000 UNITS PO CPEP
72000.0000 [IU] | ORAL_CAPSULE | Freq: Three times a day (TID) | ORAL | Status: DC
  Administered 2019-01-09: 16:00:00 72000 [IU] via ORAL
  Filled 2019-01-08 (×7): qty 2

## 2019-01-08 MED ORDER — LEVALBUTEROL HCL 0.63 MG/3ML IN NEBU: 0.6300 mg | INHALATION_SOLUTION | RESPIRATORY_TRACT | Status: DC | PRN

## 2019-01-08 MED ORDER — HYDROCODONE-ACETAMINOPHEN 5-325 MG PO TABS: 1.0000 | ORAL_TABLET | ORAL | Status: DC | PRN

## 2019-01-08 MED ORDER — SODIUM CHLORIDE 0.9 % IV SOLN
8.0000 mg/h | INTRAVENOUS | Status: DC
  Administered 2019-01-08: 20:00:00 8 mg/h via INTRAVENOUS
  Filled 2019-01-08 (×4): qty 80

## 2019-01-08 MED ORDER — PANTOPRAZOLE SODIUM 40 MG IV SOLR: 40.0000 mg | Freq: Two times a day (BID) | INTRAVENOUS | Status: DC

## 2019-01-08 MED ORDER — AMIODARONE HCL 200 MG PO TABS
200.0000 mg | ORAL_TABLET | Freq: Every day | ORAL | Status: DC
  Administered 2019-01-09 – 2019-01-10 (×2): 200 mg via ORAL
  Filled 2019-01-08 (×2): qty 1

## 2019-01-08 MED ORDER — ACETAMINOPHEN 325 MG PO TABS: 650.0000 mg | ORAL_TABLET | Freq: Four times a day (QID) | ORAL | Status: DC | PRN

## 2019-01-08 NOTE — Telephone Encounter (Signed)
After talking with Cassandra Lofts PA... pt to take an extra Lasix today and pt is having a televisit today at 1pm with her PCP... will continue to monitor her stools and any new symptoms.

## 2019-01-08 NOTE — ED Provider Notes (Addendum)
[facility] DEPT Provider Note   CSN: [id number] Arrival date & time: 01/08/19  1419    History   Chief Complaint Chief Complaint  Patient presents with  . Blood In Stools    HPI Cassandra Ray is a 85 y.o. female.     Pt presents to the ED today with dark stools.  Pt has recently been diagnosed with afib and is on Eliquis.  She has noticed some black stools for the past few days.     Past Medical History:  Diagnosis Date  . Actinic keratoses   . Arthritis    back   . Breast cancer ([place])    Breast cancer (right)  . Chronic diastolic CHF (congestive heart failure) ([place])   . CKD (chronic kidney disease), stage III ([place])   . Compression fracture of L3 lumbar vertebra   . Dysphagia   . GERD (gastroesophageal reflux disease)    takes otc Pepcid as needed  . Hiatal hernia 11/27/2014  . HTN (hypertension)   . Hypothyroidism   . Insomnia   . Osteopenia   . PAF (paroxysmal atrial fibrillation) ([place])    a. questionable episode in 2015 in [facility]. b. event monitor through December/January 2014/2015 which showed no evidence of atrial fibrillation. c. Dx 12/2018.  Marland Kitchen PAT (paroxysmal atrial tachycardia) ([place])   . Pleural effusion     Patient Active Problem List   Diagnosis Date Noted  . Upper GI bleed 01/08/2019  . Atrial fibrillation with RVR ([place])   . New onset a-fib ([place]) 12/12/2018  . Acute kidney injury superimposed on chronic kidney disease ([place]) 12/12/2018  . Leukocytosis 12/12/2018  . Gastroesophageal reflux disease   . Weakness   . Tachycardia 05/24/2018  . Acute respiratory failure with hypoxia ([place]) 05/23/2018  . Bilateral impacted cerumen 11/22/2016  . Presbycusis of both ears 11/22/2016  . Pleural effusion, right 02/08/2016  . Acute on chronic diastolic CHF (congestive heart failure) ([place]) 02/08/2016  . Lower extremity edema 01/31/2016  . Chronic diastolic heart failure ([place]) 02/03/2015  . Hyponatremia 04/25/2014  . Shortness  of breath   . Hypertensive urgency 04/24/2014  . Compression fracture 09/18/2013  . Knee pain 08/11/2013  . A-fib ([place]) 05/09/2013  . Back pain 05/09/2013  . HTN (hypertension)   . Hypothyroidism     Past Surgical History:  Procedure Laterality Date  . BREAST LUMPECTOMY Right 1995   lumpectomy  . COLONOSCOPY    . EYE SURGERY Bilateral    cataract w/ lens implant  . KYPHOPLASTY N/A 09/18/2013   Procedure: KYPHOPLASTY;  Surgeon: Sinclair Ship, MD;  Location: [place];  Service: Orthopedics;  Laterality: N/A;  Lumbar 3 kyphoplasty  . TONSILLECTOMY       OB History   No obstetric history on file.      Home Medications    Prior to Admission medications   Medication Sig Start Date End Date Taking? Authorizing Provider  acetaminophen (TYLENOL) 500 MG tablet Take 500-1,000 mg by mouth every 8 (eight) hours as needed for mild pain or headache.   Yes [provider]  amiodarone (PACERONE) 200 MG tablet Take 1 tablet (200 mg total) by mouth daily. 12/25/18  Yes Dunn, Dayna N, PA-C  apixaban (ELIQUIS) 2.5 MG TABS tablet Take 1 tablet (2.5 mg total) by mouth 2 (two) times daily. 12/25/18  Yes Dunn, Dayna N, PA-C  B Complex-C (B-COMPLEX WITH VITAMIN C) tablet Take 1 tablet by mouth daily.   Yes [provider]  Calcium-Phosphorus-Vitamin D (CITRACAL +D3 PO) Take 1 tablet by mouth daily.   Yes [provider]  Cholecalciferol (VITAMIN D3) 2000 units TABS Take 2,000 Units by mouth daily.    Yes [provider]  furosemide (LASIX) 40 MG tablet Take 1 tablet (40 mg total) by mouth daily. Patient taking differently: Take 40-80 mg by mouth daily. Take 2 tablets (80 mg) and Take 1 tablet (40 mg) in the afternoon. 12/25/18  Yes Dunn, Dayna N, PA-C  Glucosamine HCl 1500 MG TABS Take 3,000 mg by mouth at bedtime.    Yes [provider]  levothyroxine (SYNTHROID) 100 MCG tablet Take 1 tablet (100 mcg total) by mouth daily before breakfast. 12/17/18  Yes  Eugenie Filler, MD  lipase/protease/amylase (CREON) 36000 UNITS CPEP capsule Take 72,000 Units by mouth 3 (three) times daily.    Yes [provider]  metoprolol tartrate (LOPRESSOR) 25 MG tablet Take 0.5 tablets (12.5 mg total) by mouth 2 (two) times daily. 12/25/18  Yes Dunn, Nedra Hai, PA-C  Probiotic Product (ALIGN) 4 MG CAPS Take 4 mg by mouth daily.   Yes [provider]  famotidine (PEPCID) 40 MG/5ML suspension Take 2.5 mLs (20 mg total) by mouth daily. Patient not taking: Reported on 01/08/2019 12/18/18   Eugenie Filler, MD  levalbuterol [facility] HFA) 45 MCG/ACT inhaler Inhale 2 puffs into the lungs every 4 (four) hours as needed for wheezing. 12/17/18 12/17/19  Eugenie Filler, MD  loperamide (IMODIUM A-D) 2 MG tablet Take 2 mg by mouth daily as needed for diarrhea or loose stools.     [provider]  nitroGLYCERIN (NITROSTAT) 0.4 MG SL tablet Place 0.4 mg under the tongue every 5 (five) minutes as needed for chest pain.    [provider]    Family History Family History  Problem Relation Age of Onset  . CAD Mother   . Diabetes Mellitus II Mother   . Heart failure Mother   . Heart failure Father   . Prostate cancer Brother   . Kidney cancer Brother   . Heart disease Sister   . Esophageal cancer Neg Hx   . Colon cancer Neg Hx     Social History Social History   Tobacco Use  . Smoking status: Former Research scientist (life sciences)  . Smokeless tobacco: Never Used  . Tobacco comment: only in college  Substance Use Topics  . Alcohol use: Yes    Alcohol/week: 7.0 standard drinks    Types: 7 Glasses of wine per week    Comment: 1 glass of wine a day  . Drug use: No     Allergies   Tape   Review of Systems Review of Systems  Gastrointestinal:       Dark stool  All other systems reviewed and are negative.    Physical Exam Updated Vital Signs BP 131/79 (BP Location: Left Arm)   Pulse 87   Temp 97.9 F (36.6 C) (Oral)   Resp (!) 28   LMP   (LMP Unknown)   SpO2 99%   Physical Exam Vitals signs and nursing note reviewed.  Constitutional:      Appearance: Normal appearance.  HENT:     Head: Normocephalic and atraumatic.     Right Ear: External ear normal.     Left Ear: External ear normal.     Nose: Nose normal.     Mouth/Throat:     Mouth: Mucous membranes are moist.     Pharynx: Oropharynx is  clear.  Eyes:     Extraocular Movements: Extraocular movements intact.     Conjunctiva/sclera: Conjunctivae normal.     Pupils: Pupils are equal, round, and reactive to light.  Neck:     Musculoskeletal: Normal range of motion and neck supple.  Cardiovascular:     Rate and Rhythm: Normal rate. Rhythm irregular.     Pulses: Normal pulses.     Heart sounds: Normal heart sounds.  Pulmonary:     Effort: Pulmonary effort is normal.     Breath sounds: Normal breath sounds.  Abdominal:     General: Abdomen is flat. Bowel sounds are normal.  Genitourinary:    Rectum: Guaiac result positive.  Musculoskeletal: Normal range of motion.  Skin:    General: Skin is warm.     Capillary Refill: Capillary refill takes less than 2 seconds.  Neurological:     General: No focal deficit present.     Mental Status: She is alert and oriented to person, place, and time.  Psychiatric:        Mood and Affect: Mood normal.        Behavior: Behavior normal.        Thought Content: Thought content normal.        Judgment: Judgment normal.      ED Treatments / Results  Labs (all labs ordered are listed, but only abnormal results are displayed) Labs Reviewed  COMPREHENSIVE METABOLIC PANEL - Abnormal; Notable for the following components:      Result Value   Potassium 3.4 (*)    Glucose, Bld 104 (*)    BUN 49 (*)    Creatinine, Ser 1.70 (*)    GFR calc non Af Amer 26 (*)    GFR calc Af Amer 30 (*)    All other components within normal limits  CBC WITH DIFFERENTIAL/PLATELET - Abnormal; Notable for the following components:   RBC 3.65  (*)    Hemoglobin 11.4 (*)    Monocytes Absolute 1.3 (*)    All other components within normal limits  PROTIME-INR - Abnormal; Notable for the following components:   Prothrombin Time 15.9 (*)    INR 1.3 (*)    All other components within normal limits  POC OCCULT BLOOD, ED - Abnormal; Notable for the following components:   Fecal Occult Bld POSITIVE (*)    All other components within normal limits  SARS CORONAVIRUS 2 (HOSPITAL ORDER, [place] [facility])  TYPE AND SCREEN  ABO/RH  TROPONIN I (HIGH SENSITIVITY)    EKG EKG Interpretation  Date/Time:  Wednesday January 08 2019 19:44:09 EDT Ventricular Rate:  89 PR Interval:    QRS Duration: 81 QT Interval:  393 QTC Calculation: 479 R Axis:   -12 Text Interpretation:  Sinus rhythm Prolonged PR interval Inferior infarct, old Artifact in lead(s) I III aVL aVF V1 V2 V3 V4 V5 V6 No significant change since last tracing Confirmed by Isla Pence [phone]) on 01/08/2019 7:53:46 PM   Radiology Dg Chest Port 1 View  Result Date: 01/08/2019 CLINICAL DATA:  Started Eliquis 3 weeks prior, black stools last night. History of breast cancer EXAM: PORTABLE CHEST 1 VIEW COMPARISON:  Numerous priors, most recently 12/13/2018 FINDINGS: Lung volumes are diminished with hazy bibasilar opacities likely reflecting areas of atelectasis. No focal consolidation. No pneumothorax or effusion. There is chronic elevation of the right hemidiaphragm. Multiple surgical clips project over the right axilla and right chest. Cardiac silhouette remains enlarged with central venous prominence  and cephalized, indistinct vascularity. The aorta is calcified and tortuous. Degenerative changes are present in the imaged spine and shoulders. No acute osseous or soft tissue abnormality. IMPRESSION: Cardiomegaly with features of mild interstitial edema on a background of low volume. Electronically Signed   By: Lovena Le M.D.   On: 01/08/2019 17:00    Procedures  Procedures (including critical care time)  Medications Ordered in ED Medications  0.9 %  sodium chloride infusion ( Intravenous New Bag/Given 01/08/19 1922)  pantoprazole (PROTONIX) 80 mg in sodium chloride 0.9 % 250 mL (0.32 mg/mL) infusion (has no administration in time range)  pantoprazole (PROTONIX) injection 40 mg (has no administration in time range)  pantoprazole (PROTONIX) 80 mg in sodium chloride 0.9 % 100 mL IVPB (80 mg Intravenous New Bag/Given 01/08/19 1924)     Initial Impression / Assessment and Plan / ED Course  I have reviewed the triage vital signs and the nursing notes.  Pertinent labs & imaging results that were available during my care of the patient were reviewed by me and considered in my medical decision making (see chart for details).       Pt's hgb has dropped about 1 g since mid July.  Pt had melanotic stool which was guaiac +.  The pt sees Dr. Earlean Shawl (GI) who does not come to the hospital.  Pt d/w Dr. Collene Mares who is on for unassigned.  She recommends protonix, hold Eliquis, and npo after midnight for an EGD tomorrow.  Pt d/w Dr. Roel Cluck (triad) for admission.  CRITICAL CARE Performed by: Isla Pence   Total critical care time: 30 minutes  Critical care time was exclusive of separately billable procedures and treating other patients.  Critical care was necessary to treat or prevent imminent or life-threatening deterioration.  Critical care was time spent personally by me on the following activities: development of treatment plan with patient and/or surrogate as well as nursing, discussions with consultants, evaluation of patient's response to treatment, examination of patient, obtaining history from patient or surrogate, ordering and performing treatments and interventions, ordering and review of laboratory studies, ordering and review of radiographic studies, pulse oximetry and re-evaluation of patient's condition.    Final Clinical Impressions(s) / ED  Diagnoses   Final diagnoses:  GI bleed  UGI bleed  Anemia, unspecified type  On apixaban therapy    ED Discharge Orders    None       Isla Pence, MD 01/08/19 Merrily Pew    Isla Pence, MD 01/08/19 [phone]

## 2019-01-08 NOTE — Consult Note (Signed)
UNASSIGNED PATIENT Reason for Consult: Black stools since last night. Referring Physician: ER MD  [place] Cassandra Ray is an 85 y.o. female.  HPI: Cassandra Ray is a 85 year old female, with multiple medical problems listed below, who presented to the emergency room today with history of melena that she is noted last night. Patient was recently started on Eliquis for atrial fibrillation.  She started having some ankle swelling with increasing her weight of 4 pounds over the last 2 weeks with abdominal discomfort worsening over the last 24 hours prompting her to come to the emergency room.  She was found to be guaiac positive in the emergency room and rapid Covid test was negative.  She denies having any near-syncope or syncopal events.  Patient denies any abdominal pain at the present time.  She takes an occasional Pepcid for reflux and has a history of a hiatal hernia. There is no history of hematochezia or hematemesis.  GI consultation was requested for an EGD to be done as soon as possible. Patient sees Dr. Richmond Campbell for GI care; he is with a [facility] practicing in [place] but does not admit to the hospital   Past Medical History:  Diagnosis Date  . Actinic keratoses   . Arthritis    back   . Breast cancer ([place])    Breast cancer (right)  . Chronic diastolic CHF (congestive heart failure) ([place])   . CKD (chronic kidney disease), stage III ([place])   . Compression fracture of L3 lumbar vertebra   . Dysphagia   . GERD (gastroesophageal reflux disease)    takes otc Pepcid as needed  . Hiatal hernia 11/27/2014  . HTN (hypertension)   . Hypothyroidism   . Insomnia   . Osteopenia   . PAF (paroxysmal atrial fibrillation) ([place])    a. questionable episode in 2015 in [facility]. b. event monitor through December/January 2014/2015 which showed no evidence of atrial fibrillation. c. Dx 12/2018.  Marland Kitchen PAT (paroxysmal atrial tachycardia) ([place])   . Pleural effusion    Past Surgical  History:  Procedure Laterality Date  . BREAST LUMPECTOMY Right 1995   lumpectomy  . COLONOSCOPY    . EYE SURGERY Bilateral    cataract w/ lens implant  . KYPHOPLASTY N/A 09/18/2013   Procedure: KYPHOPLASTY;  Surgeon: Sinclair Ship, MD;  Location: [place];  Service: Orthopedics;  Laterality: N/A;  Lumbar 3 kyphoplasty  . TONSILLECTOMY     Family History  Problem Relation Age of Onset  . CAD Mother   . Diabetes Mellitus II Mother   . Heart failure Mother   . Heart failure Father   . Prostate cancer Brother   . Kidney cancer Brother   . Heart disease Sister   . Esophageal cancer Neg Hx   . Colon cancer Neg Hx    Social History:  reports that she has quit smoking. She has never used smokeless tobacco. She reports current alcohol use of about 7.0 standard drinks of alcohol per week. She reports that she does not use drugs.  Allergies:  Allergies  Allergen Reactions  . Tape Other (See Comments)    Patient prefers easy-release tape   Medications: I have reviewed the patient's current medications.  Results for orders placed or performed during the hospital encounter of 01/08/19 (from the past 48 hour(s))  POC occult blood, ED Provider will collect     Status: Abnormal   Collection Time: 01/08/19  4:10 PM  Result Value Ref Range  Fecal Occult Bld POSITIVE (A) NEGATIVE  Type and screen [place]     Status: None   Collection Time: 01/08/19  5:06 PM  Result Value Ref Range   ABO/RH(D) O POS    Antibody Screen NEG    Sample Expiration      01/11/2019,2359 Performed at [facility], [place] [address] [place], [place] [address]   Comprehensive metabolic panel     Status: Abnormal   Collection Time: 01/08/19  5:12 PM  Result Value Ref Range   Sodium 140 135 - 145 mmol/L   Potassium 3.4 (L) 3.5 - 5.1 mmol/L   Chloride 102 98 - 111 mmol/L   CO2 25 22 - 32 mmol/L   Glucose, Bld 104 (H) 70 - 99 mg/dL   BUN 49 (H) 8 - 23 mg/dL    Creatinine, Ser 1.70 (H) 0.44 - 1.00 mg/dL   Calcium 9.4 8.9 - 10.3 mg/dL   Total Protein 6.8 6.5 - 8.1 g/dL   Albumin 3.5 3.5 - 5.0 g/dL   AST 25 15 - 41 U/L   ALT 16 0 - 44 U/L   Alkaline Phosphatase 67 38 - 126 U/L   Total Bilirubin 0.5 0.3 - 1.2 mg/dL   GFR calc non Af Amer 26 (L) >60 mL/min   GFR calc Af Amer 30 (L) >60 mL/min   Anion gap 13 5 - 15    Comment: Performed at [facility], [place] [address], [place], [place] [address]  CBC WITH DIFFERENTIAL     Status: Abnormal   Collection Time: 01/08/19  5:12 PM  Result Value Ref Range   WBC 9.1 4.0 - 10.5 K/uL   RBC 3.65 (L) 3.87 - 5.11 MIL/uL   Hemoglobin 11.4 (L) 12.0 - 15.0 g/dL   HCT 36.1 36.0 - 46.0 %   MCV 98.9 80.0 - 100.0 fL   MCH 31.2 26.0 - 34.0 pg   MCHC 31.6 30.0 - 36.0 g/dL   RDW 13.6 11.5 - 15.5 %   Platelets 172 150 - 400 K/uL   nRBC 0.0 0.0 - 0.2 %   Neutrophils Relative % 53 %   Neutro Abs 4.8 1.7 - 7.7 K/uL   Lymphocytes Relative 28 %   Lymphs Abs 2.6 0.7 - 4.0 K/uL   Monocytes Relative 15 %   Monocytes Absolute 1.3 (H) 0.1 - 1.0 K/uL   Eosinophils Relative 3 %   Eosinophils Absolute 0.3 0.0 - 0.5 K/uL   Basophils Relative 1 %   Basophils Absolute 0.1 0.0 - 0.1 K/uL   Immature Granulocytes 0 %   Abs Immature Granulocytes 0.04 0.00 - 0.07 K/uL    Comment: Performed at [facility], [place] [address] [place], [place] [address]  Protime-INR     Status: Abnormal   Collection Time: 01/08/19  5:12 PM  Result Value Ref Range   Prothrombin Time 15.9 (H) 11.4 - 15.2 seconds   INR 1.3 (H) 0.8 - 1.2    Comment: (NOTE) INR goal varies based on device and disease states. Performed at [facility], [place] [address] [place], [place] [address]   ABO/Rh     Status: None (Preliminary result)   Collection Time: 01/08/19  5:30 PM  Result Value Ref Range   ABO/RH(D)      O POS Performed at [facility], [place] [address], [place], [place]  [address]   SARS Coronavirus 2 [facility] order, Performed in [facility]  hospital lab) Nasopharyngeal Nasopharyngeal Swab     Status: None   Collection Time: 01/08/19  6:14 PM   Specimen: Nasopharyngeal Swab  Result Value Ref Range   SARS Coronavirus 2 NEGATIVE NEGATIVE    Comment: (NOTE) If result is NEGATIVE SARS-CoV-2 target nucleic acids are NOT DETECTED. The SARS-CoV-2 RNA is generally detectable in upper and lower  respiratory specimens during the acute phase of infection. The lowest  concentration of SARS-CoV-2 viral copies this assay can detect is 250  copies / mL. A negative result does not preclude SARS-CoV-2 infection  and should not be used as the sole basis for treatment or other  patient management decisions.  A negative result may occur with  improper specimen collection / handling, submission of specimen other  than nasopharyngeal swab, presence of viral mutation(s) within the  areas targeted by this assay, and inadequate number of viral copies  (<250 copies / mL). A negative result must be combined with clinical  observations, patient history, and epidemiological information. If result is POSITIVE SARS-CoV-2 target nucleic acids are DETECTED. The SARS-CoV-2 RNA is generally detectable in upper and lower  respiratory specimens dur ing the acute phase of infection.  Positive  results are indicative of active infection with SARS-CoV-2.  Clinical  correlation with patient history and other diagnostic information is  necessary to determine patient infection status.  Positive results do  not rule out bacterial infection or co-infection with other viruses. If result is PRESUMPTIVE POSTIVE SARS-CoV-2 nucleic acids MAY BE PRESENT.   A presumptive positive result was obtained on the submitted specimen  and confirmed on repeat testing.  While 2019 novel coronavirus  (SARS-CoV-2) nucleic acids may be present in the submitted sample  additional confirmatory testing may be necessary  for epidemiological  and / or clinical management purposes  to differentiate between  SARS-CoV-2 and other Sarbecovirus currently known to infect humans.  If clinically indicated additional testing with an alternate test  methodology [id number]) is advised. The SARS-CoV-2 RNA is generally  detectable in upper and lower respiratory sp ecimens during the acute  phase of infection. The expected result is Negative. Fact Sheet for Patients:  StrictlyIdeas.no Fact Sheet for Healthcare Providers: BankingDealers.co.za This test is not yet approved or cleared by the [place] FDA and has been authorized for detection and/or diagnosis of SARS-CoV-2 by FDA under an Emergency Use Authorization (EUA).  This EUA will remain in effect (meaning this test can be used) for the duration of the COVID-19 declaration under Section 564(b)(1) of the Act, 21 U.S.C. section 360bbb-3(b)(1), unless the authorization is terminated or revoked sooner. Performed at [facility], [place] [address], [place], [place] [address]    Dg Chest Port 1 View  Result Date: 01/08/2019 CLINICAL DATA:  Started Eliquis 3 weeks prior, black stools last night. History of breast cancer EXAM: PORTABLE CHEST 1 VIEW COMPARISON:  Numerous priors, most recently 12/13/2018 FINDINGS: Lung volumes are diminished with hazy bibasilar opacities likely reflecting areas of atelectasis. No focal consolidation. No pneumothorax or effusion. There is chronic elevation of the right hemidiaphragm. Multiple surgical clips project over the right axilla and right chest. Cardiac silhouette remains enlarged with central venous prominence and cephalized, indistinct vascularity. The aorta is calcified and tortuous. Degenerative changes are present in the imaged spine and shoulders. No acute osseous or soft tissue abnormality. IMPRESSION: Cardiomegaly with features of mild interstitial edema on a  background of low volume. Electronically Signed   By: Elwin Sleight.D.  On: 01/08/2019 17:00   Review of Systems  Constitutional: Negative for diaphoresis, fever, malaise/fatigue and weight loss.  HENT: Negative.   Eyes: Negative.   Respiratory: Negative.   Cardiovascular: Negative.   Gastrointestinal: Positive for abdominal pain, blood in stool, heartburn and melena. Negative for constipation, diarrhea, nausea and vomiting.  Genitourinary: Negative.   Musculoskeletal: Positive for joint pain.  Skin: Negative.   Neurological: Positive for weakness.  Psychiatric/Behavioral: Negative for depression, hallucinations, substance abuse and suicidal ideas. The patient is nervous/anxious and has insomnia.    Blood pressure 131/79, pulse 87, temperature 97.9 F (36.6 C), temperature source Oral, resp. rate (!) 28, SpO2 99 %. Physical Exam  Constitutional: She is oriented to person, place, and time. She appears well-developed and well-nourished.  HENT:  Head: Atraumatic.  Eyes: Conjunctivae and EOM are normal.  Neck: Neck supple.  Cardiovascular: An irregularly irregular rhythm present.  Respiratory: Effort normal and breath sounds normal.  GI: Soft. Bowel sounds are normal.  Neurological: She is alert and oriented to person, place, and time.  Skin: Skin is warm and dry.  Psychiatric: She has a normal mood and affect. Her speech is normal and behavior is normal. Judgment normal. Cognition and memory are normal.   Assessment/Plan: 1) Melena with mild anemia on Eliquis/GERD with hiatal hernia-BUN 49-we will plan to do an EGD tomorrow. 2) History of April atrial fibrillation recently started on Eliquis which has been on hold since admission. 3) History of chronic diastolic congestive heart failure/hypertension/PAT. 4) Hypothyroidism. 5)Chronic kidney disease. 6) Insomnia. Juanita Craver 01/08/2019, 8:15 PM

## 2019-01-08 NOTE — Telephone Encounter (Signed)
Spoke with the pt and her husband and she reports having some abdominal discomfort in the middle of the night last night and had a bowel movement that she noticed was very dark compared to her normal... she said she a 3 more BM's after that... she says her belly feels better this morning but noticed her ankles were mildly swollen this morning.. she says she has gained 3-4 lbs over the past week.. they both deny any dietary changes that could have given her the GI changes and no added sodium that she could think of.  She denies chest pain. Sob... just concerned about her stools and being on the Eliquis.   I have advised her to rest today, keep her feet elevated and to drink water.. to monitor her sx and to call her PCP.. Dr. Lona Kettle.   Will also forward to an APP for review.

## 2019-01-08 NOTE — Telephone Encounter (Signed)
  Cassandra Ray is calling because Cassandra Ray is having black stools and her swelling in her ankles, lower legs and belly have gotten worse. Her weight has gone up 3-4 pounds in the last two weeks. He would like to get a call back from Cassandra Ray since that is who the patient saw last.

## 2019-01-08 NOTE — H&P (View-Only) (Signed)
UNASSIGNED PATIENT Reason for Consult: Black stools since last night. Referring Physician: ER MD  Cassandra Ray is an 85 y.o. female.  HPI: Cassandra Ray is a 86 year old female, with multiple medical problems listed below, who presented to the emergency room today with history of melena that she is noted last night. Patient was recently started on Eliquis for atrial fibrillation.  She started having some ankle swelling with increasing her weight of 4 pounds over the last 2 weeks with abdominal discomfort worsening over the last 24 hours prompting her to come to the emergency room.  She was found to be guaiac positive in the emergency room and rapid Covid test was negative.  She denies having any near-syncope or syncopal events.  Patient denies any abdominal pain at the present time.  She takes an occasional Pepcid for reflux and has a history of a hiatal hernia. There is no history of hematochezia or hematemesis.  GI consultation was requested for an EGD to be done as soon as possible. Patient sees Dr. Richmond Campbell for GI care; he is with a [facility] practicing in [place] but does not admit to the hospital   Past Medical History:  Diagnosis Date  . Actinic keratoses   . Arthritis    back   . Breast cancer ([place])    Breast cancer (right)  . Chronic diastolic CHF (congestive heart failure) ([place])   . CKD (chronic kidney disease), stage III ([place])   . Compression fracture of L3 lumbar vertebra   . Dysphagia   . GERD (gastroesophageal reflux disease)    takes otc Pepcid as needed  . Hiatal hernia 11/27/2014  . HTN (hypertension)   . Hypothyroidism   . Insomnia   . Osteopenia   . PAF (paroxysmal atrial fibrillation) ([place])    a. questionable episode in 2015 in [facility] b. event monitor through December/January 2014/2015 which showed no evidence of atrial fibrillation. c. Dx 12/2018.  Marland Kitchen PAT (paroxysmal atrial tachycardia) ([place])   . Pleural effusion    Past Surgical  History:  Procedure Laterality Date  . BREAST LUMPECTOMY Right 1995   lumpectomy  . COLONOSCOPY    . EYE SURGERY Bilateral    cataract w/ lens implant  . KYPHOPLASTY N/A 09/18/2013   Procedure: KYPHOPLASTY;  Surgeon: Sinclair Ship, MD;  Location: [place];  Service: Orthopedics;  Laterality: N/A;  Lumbar 3 kyphoplasty  . TONSILLECTOMY     Family History  Problem Relation Age of Onset  . CAD Mother   . Diabetes Mellitus II Mother   . Heart failure Mother   . Heart failure Father   . Prostate cancer Brother   . Kidney cancer Brother   . Heart disease Sister   . Esophageal cancer Neg Hx   . Colon cancer Neg Hx    Social History:  reports that she has quit smoking. She has never used smokeless tobacco. She reports current alcohol use of about 7.0 standard drinks of alcohol per week. She reports that she does not use drugs.  Allergies:  Allergies  Allergen Reactions  . Tape Other (See Comments)    Patient prefers easy-release tape   Medications: I have reviewed the patient's current medications.  Results for orders placed or performed during the hospital encounter of 01/08/19 (from the past 48 hour(s))  POC occult blood, ED Provider will collect     Status: Abnormal   Collection Time: 01/08/19  4:10 PM  Result Value Ref Range  Fecal Occult Bld POSITIVE (A) NEGATIVE  Type and screen [place]     Status: None   Collection Time: 01/08/19  5:06 PM  Result Value Ref Range   ABO/RH(D) O POS    Antibody Screen NEG    Sample Expiration      01/11/2019,2359 Performed at [facility], [place] [address] [place], [place] [address]   Comprehensive metabolic panel     Status: Abnormal   Collection Time: 01/08/19  5:12 PM  Result Value Ref Range   Sodium 140 135 - 145 mmol/L   Potassium 3.4 (L) 3.5 - 5.1 mmol/L   Chloride 102 98 - 111 mmol/L   CO2 25 22 - 32 mmol/L   Glucose, Bld 104 (H) 70 - 99 mg/dL   BUN 49 (H) 8 - 23 mg/dL    Creatinine, Ser 1.70 (H) 0.44 - 1.00 mg/dL   Calcium 9.4 8.9 - 10.3 mg/dL   Total Protein 6.8 6.5 - 8.1 g/dL   Albumin 3.5 3.5 - 5.0 g/dL   AST 25 15 - 41 U/L   ALT 16 0 - 44 U/L   Alkaline Phosphatase 67 38 - 126 U/L   Total Bilirubin 0.5 0.3 - 1.2 mg/dL   GFR calc non Af Amer 26 (L) >60 mL/min   GFR calc Af Amer 30 (L) >60 mL/min   Anion gap 13 5 - 15    Comment: Performed at [facility], [place] [address], [place], [place] [address]  CBC WITH DIFFERENTIAL     Status: Abnormal   Collection Time: 01/08/19  5:12 PM  Result Value Ref Range   WBC 9.1 4.0 - 10.5 K/uL   RBC 3.65 (L) 3.87 - 5.11 MIL/uL   Hemoglobin 11.4 (L) 12.0 - 15.0 g/dL   HCT 36.1 36.0 - 46.0 %   MCV 98.9 80.0 - 100.0 fL   MCH 31.2 26.0 - 34.0 pg   MCHC 31.6 30.0 - 36.0 g/dL   RDW 13.6 11.5 - 15.5 %   Platelets 172 150 - 400 K/uL   nRBC 0.0 0.0 - 0.2 %   Neutrophils Relative % 53 %   Neutro Abs 4.8 1.7 - 7.7 K/uL   Lymphocytes Relative 28 %   Lymphs Abs 2.6 0.7 - 4.0 K/uL   Monocytes Relative 15 %   Monocytes Absolute 1.3 (H) 0.1 - 1.0 K/uL   Eosinophils Relative 3 %   Eosinophils Absolute 0.3 0.0 - 0.5 K/uL   Basophils Relative 1 %   Basophils Absolute 0.1 0.0 - 0.1 K/uL   Immature Granulocytes 0 %   Abs Immature Granulocytes 0.04 0.00 - 0.07 K/uL    Comment: Performed at [facility], [place] [address], [place], [place] [address]  Protime-INR     Status: Abnormal   Collection Time: 01/08/19  5:12 PM  Result Value Ref Range   Prothrombin Time 15.9 (H) 11.4 - 15.2 seconds   INR 1.3 (H) 0.8 - 1.2    Comment: (NOTE) INR goal varies based on device and disease states. Performed at [facility], [place] [address], [place], [place] [address]   ABO/Rh     Status: None (Preliminary result)   Collection Time: 01/08/19  5:30 PM  Result Value Ref Range   ABO/RH(D)      O POS Performed at [facility], [place] [address], [place],   [address]   SARS Coronavirus 2 [facility] order, Performed in [facility]  hospital lab) Nasopharyngeal Nasopharyngeal Swab     Status: None   Collection Time: 01/08/19  6:14 PM   Specimen: Nasopharyngeal Swab  Result Value Ref Range   SARS Coronavirus 2 NEGATIVE NEGATIVE    Comment: (NOTE) If result is NEGATIVE SARS-CoV-2 target nucleic acids are NOT DETECTED. The SARS-CoV-2 RNA is generally detectable in upper and lower  respiratory specimens during the acute phase of infection. The lowest  concentration of SARS-CoV-2 viral copies this assay can detect is 250  copies / mL. A negative result does not preclude SARS-CoV-2 infection  and should not be used as the sole basis for treatment or other  patient management decisions.  A negative result may occur with  improper specimen collection / handling, submission of specimen other  than nasopharyngeal swab, presence of viral mutation(s) within the  areas targeted by this assay, and inadequate number of viral copies  (<250 copies / mL). A negative result must be combined with clinical  observations, patient history, and epidemiological information. If result is POSITIVE SARS-CoV-2 target nucleic acids are DETECTED. The SARS-CoV-2 RNA is generally detectable in upper and lower  respiratory specimens dur ing the acute phase of infection.  Positive  results are indicative of active infection with SARS-CoV-2.  Clinical  correlation with patient history and other diagnostic information is  necessary to determine patient infection status.  Positive results do  not rule out bacterial infection or co-infection with other viruses. If result is PRESUMPTIVE POSTIVE SARS-CoV-2 nucleic acids MAY BE PRESENT.   A presumptive positive result was obtained on the submitted specimen  and confirmed on repeat testing.  While 2019 novel coronavirus  (SARS-CoV-2) nucleic acids may be present in the submitted sample  additional confirmatory testing may be necessary  for epidemiological  and / or clinical management purposes  to differentiate between  SARS-CoV-2 and other Sarbecovirus currently known to infect humans.  If clinically indicated additional testing with an alternate test  methodology [phone]) is advised. The SARS-CoV-2 RNA is generally  detectable in upper and lower respiratory sp ecimens during the acute  phase of infection. The expected result is Negative. Fact Sheet for Patients:  StrictlyIdeas.no Fact Sheet for Healthcare Providers: BankingDealers.co.za This test is not yet approved or cleared by the [place] FDA and has been authorized for detection and/or diagnosis of SARS-CoV-2 by FDA under an Emergency Use Authorization (EUA).  This EUA will remain in effect (meaning this test can be used) for the duration of the COVID-19 declaration under Section 564(b)(1) of the Act, 21 U.S.C. section 360bbb-3(b)(1), unless the authorization is terminated or revoked sooner. Performed at [facility], [place] [address] [place], [place] [address]    Dg Chest Port 1 View  Result Date: 01/08/2019 CLINICAL DATA:  Started Eliquis 3 weeks prior, black stools last night. History of breast cancer EXAM: PORTABLE CHEST 1 VIEW COMPARISON:  Numerous priors, most recently 12/13/2018 FINDINGS: Lung volumes are diminished with hazy bibasilar opacities likely reflecting areas of atelectasis. No focal consolidation. No pneumothorax or effusion. There is chronic elevation of the right hemidiaphragm. Multiple surgical clips project over the right axilla and right chest. Cardiac silhouette remains enlarged with central venous prominence and cephalized, indistinct vascularity. The aorta is calcified and tortuous. Degenerative changes are present in the imaged spine and shoulders. No acute osseous or soft tissue abnormality. IMPRESSION: Cardiomegaly with features of mild interstitial edema on a  background of low volume. Electronically Signed   By: Elwin Sleight.D.  On: 01/08/2019 17:00   Review of Systems  Constitutional: Negative for diaphoresis, fever, malaise/fatigue and weight loss.  HENT: Negative.   Eyes: Negative.   Respiratory: Negative.   Cardiovascular: Negative.   Gastrointestinal: Positive for abdominal pain, blood in stool, heartburn and melena. Negative for constipation, diarrhea, nausea and vomiting.  Genitourinary: Negative.   Musculoskeletal: Positive for joint pain.  Skin: Negative.   Neurological: Positive for weakness.  Psychiatric/Behavioral: Negative for depression, hallucinations, substance abuse and suicidal ideas. The patient is nervous/anxious and has insomnia.    Blood pressure 131/79, pulse 87, temperature 97.9 F (36.6 C), temperature source Oral, resp. rate (!) 28, SpO2 99 %. Physical Exam  Constitutional: She is oriented to person, place, and time. She appears well-developed and well-nourished.  HENT:  Head: Atraumatic.  Eyes: Conjunctivae and EOM are normal.  Neck: Neck supple.  Cardiovascular: An irregularly irregular rhythm present.  Respiratory: Effort normal and breath sounds normal.  GI: Soft. Bowel sounds are normal.  Neurological: She is alert and oriented to person, place, and time.  Skin: Skin is warm and dry.  Psychiatric: She has a normal mood and affect. Her speech is normal and behavior is normal. Judgment normal. Cognition and memory are normal.   Assessment/Plan: 1) Melena with mild anemia on Eliquis/GERD with hiatal hernia-BUN 49-we will plan to do an EGD tomorrow. 2) History of April atrial fibrillation recently started on Eliquis which has been on hold since admission. 3) History of chronic diastolic congestive heart failure/hypertension/PAT. 4) Hypothyroidism. 5)Chronic kidney disease. 6) Insomnia. Juanita Craver 01/08/2019, 8:15 PM

## 2019-01-08 NOTE — H&P (Signed)
Cassandra Ray IOX:[id number] DOB: Jan 07, 1923 DOA: 01/08/2019     PCP: Lawerance Cruel, MD   Outpatient Specialists:  CARDS:  Dr.Skains    Patient arrived to ER on 01/08/19 at 1419  Patient coming from:   From facility [facility] with husband Bill.  Chief Complaint:  Chief Complaint  Patient presents with  . Blood In Stools    HPI: Cassandra Ray is a 85 y.o. female with medical history significant of HTN, hypothyroidism, breast CA, GERD, diastolic dysfunction/probable chronic diastolic CHF, chronic hyponatremia, pancreatic cyst, prior pleural effusion, CKD stage III-IV, paorxysmal atrial tachycardia, paroxysmal atrial fibrillation     Presented with few week history of black stools Her ankles and belly has started to swell again.  Her weight has gone up 3 to 4 pounds for the past 2 weeks she is also been having abdominal discomfort worse last night she had 3 more bowel movements today and started to feel a little bit better no associated chest pain or shortness of breath But she became concerned since she was taking Eliquis for her atrial fibrillation. Given her symptoms she was sent to emergency department  Recently admitted in July 2020 for generalized weakness found to have AKI and chest x-ray showed mild CHF versus atypical viral pneumonia developed rapid atrial fibrillation which she has a history of in the past but had converted to sinus rhythm from initially she was treated with diltiazem and amiodarone and then she was changed to home metoprolol At the time of discharge on 14 July  K 3.9, Cr 2.16, Hgb 10.8, plt 147, Mg 2.1. Hospitalization troponin was noted to be elevated which felt to be most likely demand ischemia rather than ACS Since her discharge she has been seen by cardiology  Infectious risk factors:  Reports lightheadedness In  ER RAPID COVID TEST NEGATIVE    Regarding pertinent Chronic problems:        HTN on metoprolol   CHF  diastolic - last echo 2/68/34  ejection fraction of 60-65%.  Left ventricular diastolic Doppler parameters are indeterminate On 40 mg of Lasix a day   Pulmonary reactive disease on Symbicort and albuterol as needed     Hypothyroidism:  Lab Results  Component Value Date   TSH 0.147 (L) 12/12/2018   on synthroid     A. Fib -  - CHA2DS2 vas score 4:  current  on anticoagulation with Eliquis,           -  Rate control:  Currently controlled with  Metoprolol,         - Rhythm control:   amiodarone,     CKD stage III - baseline Cr 2.5 recently    While in ER: Found to have hemoglobin 11.4 done about 1 g Hemoccult positive stool Was given Protonix and case discussed with Dr. Collene Mares  GI who will see patient in consult keep n.p.o. post midnight  The following Work up has been ordered so far:  Orders Placed This Encounter  Procedures  . Procedural/ Surgical Case Request: ESOPHAGOGASTRODUODENOSCOPY (EGD) WITH PROPOFOL  . SARS Coronavirus 2 [facility] order, Performed in [facility] hospital lab) Nasopharyngeal Nasopharyngeal Swab  . DG Chest Port 1 View  . Comprehensive metabolic panel  . CBC WITH DIFFERENTIAL  . Protime-INR  . Diet NPO time specified  . Diet clear liquid Room service appropriate? Yes; Fluid consistency: Thin  . Orthostatic Vital signs  . Initiate Carrier Fluid Protocol  . Cardiac monitoring  .  Cardiac monitoring  . Consult to gastroenterology  ALL PATIENTS BEING ADMITTED/HAVING PROCEDURES NEED COVID-19 SCREENING  . Consult to hospitalist  ALL PATIENTS BEING ADMITTED/HAVING PROCEDURES NEED COVID-19 SCREENING  . Social work consult  . Pulse oximetry, continuous  . POC occult blood, ED Provider will collect  . ED EKG  . EKG 12-Lead  . EKG 12-Lead  . Type and screen Reading  . ABO/Rh  . Insert peripheral IV  . Admit to Inpatient (patient's expected length of stay will be greater than 2 midnights or inpatient only procedure)     Following  Medications were ordered in ER: Medications  0.9 %  sodium chloride infusion ( Intravenous Rate/Dose Change 01/08/19 2011)  pantoprazole (PROTONIX) 80 mg in sodium chloride 0.9 % 250 mL (0.32 mg/mL) infusion (8 mg/hr Intravenous New Bag/Given 01/08/19 2015)  pantoprazole (PROTONIX) injection 40 mg (has no administration in time range)  potassium chloride 10 mEq in 100 mL IVPB (10 mEq Intravenous New Bag/Given 01/08/19 2041)  pantoprazole (PROTONIX) 80 mg in sodium chloride 0.9 % 100 mL IVPB (0 mg Intravenous Stopped 01/08/19 1945)        Consult Orders  (From admission, onward)         Start     Ordered   01/08/19 2008  Social work consult  Once    Provider:  (Not yet assigned)  Question:  Reason for Consult?  Answer:  from [place]   01/08/19 2008   01/08/19 1754  Consult to hospitalist  ALL PATIENTS BEING ADMITTED/HAVING PROCEDURES NEED COVID-19 SCREENING  Once    Comments: ALL PATIENTS BEING ADMITTED/HAVING PROCEDURES NEED COVID-19 SCREENING  Provider:  (Not yet assigned)  Question Answer Comment  Place call to: [facility]   Reason for Consult Admit      01/08/19 1753          ER Provider Called: Gi    Dr. Collene Mares They Recommend admit to medicine  Will see    in ER   Significant initial  Findings: Abnormal Labs Reviewed  COMPREHENSIVE METABOLIC PANEL - Abnormal; Notable for the following components:      Result Value   Potassium 3.4 (*)    Glucose, Bld 104 (*)    BUN 49 (*)    Creatinine, Ser 1.70 (*)    GFR calc non Af Amer 26 (*)    GFR calc Af Amer 30 (*)    All other components within normal limits  CBC WITH DIFFERENTIAL/PLATELET - Abnormal; Notable for the following components:   RBC 3.65 (*)    Hemoglobin 11.4 (*)    Monocytes Absolute 1.3 (*)    All other components within normal limits  PROTIME-INR - Abnormal; Notable for the following components:   Prothrombin Time 15.9 (*)    INR 1.3 (*)    All other components within normal limits  POC OCCULT  BLOOD, ED - Abnormal; Notable for the following components:   Fecal Occult Bld POSITIVE (*)    All other components within normal limits      Otherwise labs showing:    Recent Labs  Lab 01/08/19 1712  NA 140  K 3.4*  CO2 25  GLUCOSE 104*  BUN 49*  CREATININE 1.70*  CALCIUM 9.4    Cr   stable,   Lab Results  Component Value Date   CREATININE 1.70 (H) 01/08/2019   CREATININE 1.86 (H) 12/25/2018   CREATININE 2.16 (H) 12/17/2018    Recent Labs  Lab 01/08/19 1712  AST 25  ALT 16  ALKPHOS 67  BILITOT 0.5  PROT 6.8  ALBUMIN 3.5   Lab Results  Component Value Date   CALCIUM 9.4 01/08/2019      WBC      Component Value Date/Time   WBC 9.1 01/08/2019 1712   ANC    Component Value Date/Time   NEUTROABS 4.8 01/08/2019 1712   ALC No results found for: LYMPHOABS    Plt: Lab Results  Component Value Date   PLT 172 01/08/2019      COVID-19 Labs    Lab Results  Component Value Date   SARSCOV2NAA NEGATIVE 01/08/2019   SARSCOV2NAA NEGATIVE 12/12/2018       HG/HCT  Down      Component Value Date/Time   HGB 11.4 (L) 01/08/2019 1712   HGB 12.7 12/25/2018 1404   HCT 36.1 01/08/2019 1712   HCT 37.3 12/25/2018 1404     BNP (last 3 results) Recent Labs    05/23/18 1608 12/12/18 1225  BNP 146.6* 899.9*     DM  labs:  HbA1C: No results for input(s): HGBA1C in the last 8760 hours.     UA not ordered   Urine analysis:    Component Value Date/Time   COLORURINE YELLOW 12/12/2018 2020   APPEARANCEUR CLEAR 12/12/2018 2020   LABSPEC 1.006 12/12/2018 2020   PHURINE 6.0 12/12/2018 2020   GLUCOSEU NEGATIVE 12/12/2018 2020   HGBUR NEGATIVE 12/12/2018 2020   BILIRUBINUR NEGATIVE 12/12/2018 2020   KETONESUR NEGATIVE 12/12/2018 2020   PROTEINUR NEGATIVE 12/12/2018 2020   UROBILINOGEN 0.2 04/24/2014 2257   NITRITE NEGATIVE 12/12/2018 2020   LEUKOCYTESUR NEGATIVE 12/12/2018 2020      CXR -mild interstitial edema     ECG:  Personally reviewed  by me showing: HR : 89 Rhythm: 1st degree block  no evidence of ischemic changes QTC 479     ED Triage Vitals  Enc Vitals Group     BP 01/08/19 1428 (!) 142/81     Pulse Rate 01/08/19 1428 90     Resp 01/08/19 1428 18     Temp 01/08/19 1428 97.9 F (36.6 C)     Temp Source 01/08/19 1428 Oral     SpO2 01/08/19 1428 97 %     Weight --      Height --      Head Circumference --      Peak Flow --      Pain Score 01/08/19 1435 0     Pain Loc --      Pain Edu? --      Excl. in [place]? --   TMAX(24)@       Latest  Blood pressure 131/79, pulse 87, temperature 97.9 F (36.6 C), temperature source Oral, resp. rate (!) 28, SpO2 99 %.     Hospitalist was called for admission for upper Gi bleed with melena, mild acute on chronic   diastolic CHF   Review of Systems:    Pertinent positives include: fatigue, melena  Constitutional:  No weight loss, night sweats, Fevers, chills,  weight loss  HEENT:  No headaches, Difficulty swallowing,Tooth/dental problems,Sore throat,  No sneezing, itching, ear ache, nasal congestion, post nasal drip,  Cardio-vascular:  No chest pain, Orthopnea, PND, anasarca, dizziness, palpitations.no Bilateral lower extremity swelling  GI:  No heartburn, indigestion, abdominal pain, nausea, vomiting, diarrhea, change in bowel habits, loss of appetite,  hematemesis Resp:  no shortness of breath at rest. No dyspnea on exertion, No excess mucus,  no productive cough, No non-productive cough, No coughing up of blood.No change in color of mucus.No wheezing. Skin:  no rash or lesions. No jaundice GU:  no dysuria, change in color of urine, no urgency or frequency. No straining to urinate.  No flank pain.  Musculoskeletal:  No joint pain or no joint swelling. No decreased range of motion. No back pain.  Psych:  No change in mood or affect. No depression or anxiety. No memory loss.  Neuro: no localizing neurological complaints, no tingling, no weakness, no double  vision, no gait abnormality, no slurred speech, no confusion  All systems reviewed and apart from [place] all are negative  Past Medical History:   Past Medical History:  Diagnosis Date  . Actinic keratoses   . Arthritis    back   . Breast cancer ([place])    Breast cancer (right)  . Chronic diastolic CHF (congestive heart failure) ([place])   . CKD (chronic kidney disease), stage III ([place])   . Compression fracture of L3 lumbar vertebra   . Dysphagia   . GERD (gastroesophageal reflux disease)    takes otc Pepcid as needed  . Hiatal hernia 11/27/2014  . HTN (hypertension)   . Hypothyroidism   . Insomnia   . Osteopenia   . PAF (paroxysmal atrial fibrillation) ([place])    a. questionable episode in 2015 in [facility]. b. event monitor through December/January 2014/2015 which showed no evidence of atrial fibrillation. c. Dx 12/2018.  Marland Kitchen PAT (paroxysmal atrial tachycardia) ([place])   . Pleural effusion        Past Surgical History:  Procedure Laterality Date  . BREAST LUMPECTOMY Right 1995   lumpectomy  . COLONOSCOPY    . EYE SURGERY Bilateral    cataract w/ lens implant  . KYPHOPLASTY N/A 09/18/2013   Procedure: KYPHOPLASTY;  Surgeon: Sinclair Ship, MD;  Location: [facility];  Service: Orthopedics;  Laterality: N/A;  Lumbar 3 kyphoplasty  . TONSILLECTOMY      Social History:  Ambulatory  walker        reports that she has quit smoking. She has never used smokeless tobacco. She reports current alcohol use of about 7.0 standard drinks of alcohol per week. She reports that she does not use drugs.     Family History:   Family History  Problem Relation Age of Onset  . CAD Mother   . Diabetes Mellitus II Mother   . Heart failure Mother   . Heart failure Father   . Prostate cancer Brother   . Kidney cancer Brother   . Heart disease Sister   . Esophageal cancer Neg Hx   . Colon cancer Neg Hx     Allergies: Allergies  Allergen Reactions  . Tape Other (See Comments)    Patient prefers  easy-release tape     Prior to Admission medications   Medication Sig Start Date End Date Taking? Authorizing Provider  acetaminophen (TYLENOL) 500 MG tablet Take 500-1,000 mg by mouth every 8 (eight) hours as needed for mild pain or headache.   Yes [provider]  amiodarone (PACERONE) 200 MG tablet Take 1 tablet (200 mg total) by mouth daily. 12/25/18  Yes Dunn, Dayna N, PA-C  apixaban (ELIQUIS) 2.5 MG TABS tablet Take 1 tablet (2.5 mg total) by mouth 2 (two) times daily. 12/25/18  Yes Dunn, Dayna N, PA-C  B Complex-C (B-COMPLEX WITH VITAMIN C) tablet Take 1 tablet by mouth daily.   Yes [provider]  Calcium-Phosphorus-Vitamin D (CITRACAL +  D3 PO) Take 1 tablet by mouth daily.   Yes [provider]  Cholecalciferol (VITAMIN D3) 2000 units TABS Take 2,000 Units by mouth daily.    Yes [provider]  furosemide (LASIX) 40 MG tablet Take 1 tablet (40 mg total) by mouth daily. Patient taking differently: Take 40-80 mg by mouth daily. Take 2 tablets (80 mg) and Take 1 tablet (40 mg) in the afternoon. 12/25/18  Yes Dunn, Dayna N, PA-C  Glucosamine HCl 1500 MG TABS Take 3,000 mg by mouth at bedtime.    Yes [provider]  levothyroxine (SYNTHROID) 100 MCG tablet Take 1 tablet (100 mcg total) by mouth daily before breakfast. 12/17/18  Yes Eugenie Filler, MD  lipase/protease/amylase (CREON) 36000 UNITS CPEP capsule Take 72,000 Units by mouth 3 (three) times daily.    Yes [provider]  metoprolol tartrate (LOPRESSOR) 25 MG tablet Take 0.5 tablets (12.5 mg total) by mouth 2 (two) times daily. 12/25/18  Yes Dunn, Nedra Hai, PA-C  Probiotic Product (ALIGN) 4 MG CAPS Take 4 mg by mouth daily.   Yes [provider]  famotidine (PEPCID) 40 MG/5ML suspension Take 2.5 mLs (20 mg total) by mouth daily. Patient not taking: Reported on 01/08/2019 12/18/18   Eugenie Filler, MD  levalbuterol [facility] HFA) 45 MCG/ACT inhaler Inhale 2 puffs into the  lungs every 4 (four) hours as needed for wheezing. 12/17/18 12/17/19  Eugenie Filler, MD  loperamide (IMODIUM A-D) 2 MG tablet Take 2 mg by mouth daily as needed for diarrhea or loose stools.     [provider]  nitroGLYCERIN (NITROSTAT) 0.4 MG SL tablet Place 0.4 mg under the tongue every 5 (five) minutes as needed for chest pain.    [provider]   Physical Exam: Blood pressure 131/79, pulse 87, temperature 97.9 F (36.6 C), temperature source Oral, resp. rate (!) 28, SpO2 99 %. 1. General:  in No  Acute distress   Chronically ill  appearing 2. Psychological: Alert and  Oriented 3. Head/ENT:   Dry Mucous Membranes                          Head Non traumatic, neck supple                          Poor Dentition 4. SKIN:  decreased Skin turgor,  Skin clean Dry and intact no rash 5. Heart: Regular rate and rhythm systolic Murmur, no Rub or gallop 6. Lungs:  Clear to auscultation bilaterally, no wheezes or crackles   7. Abdomen: Soft, non-tender, Non distended  bowel sounds present 8. Lower extremities: no clubbing, cyanosis, trace edema 9. Neurologically Grossly intact, moving all 4 extremities equally  10. MSK: Normal range of motion   All other LABS:     Recent Labs  Lab 01/08/19 1712  WBC 9.1  NEUTROABS 4.8  HGB 11.4*  HCT 36.1  MCV 98.9  PLT 172     Recent Labs  Lab 01/08/19 1712  NA 140  K 3.4*  CL 102  CO2 25  GLUCOSE 104*  BUN 49*  CREATININE 1.70*  CALCIUM 9.4     Recent Labs  Lab 01/08/19 1712  AST 25  ALT 16  ALKPHOS 67  BILITOT 0.5  PROT 6.8  ALBUMIN 3.5    Cultures:    Component Value Date/Time   SDES URINE, CLEAN CATCH 12/12/2018 2020   SPECREQUEST  12/12/2018 2020    NONE Performed at [facility], [place] [address] [place], [place] [address]    CULT MULTIPLE SPECIES PRESENT, SUGGEST RECOLLECTION (A) 12/12/2018 2020   REPTSTATUS 12/13/2018 FINAL 12/12/2018 2020     Radiological Exams on Admission: Dg Chest  Port 1 View  Result Date: 01/08/2019 CLINICAL DATA:  Started Eliquis 3 weeks prior, black stools last night. History of breast cancer EXAM: PORTABLE CHEST 1 VIEW COMPARISON:  Numerous priors, most recently 12/13/2018 FINDINGS: Lung volumes are diminished with hazy bibasilar opacities likely reflecting areas of atelectasis. No focal consolidation. No pneumothorax or effusion. There is chronic elevation of the right hemidiaphragm. Multiple surgical clips project over the right axilla and right chest. Cardiac silhouette remains enlarged with central venous prominence and cephalized, indistinct vascularity. The aorta is calcified and tortuous. Degenerative changes are present in the imaged spine and shoulders. No acute osseous or soft tissue abnormality. IMPRESSION: Cardiomegaly with features of mild interstitial edema on a background of low volume. Electronically Signed   By: Lovena Le M.D.   On: 01/08/2019 17:00    Chart has been reviewed   Assessment/Plan   85 y.o. female with medical history significant of HTN, hypothyroidism, breast CA, GERD, diastolic dysfunction/probable chronic diastolic CHF, chronic hyponatremia, pancreatic cyst, prior pleural effusion, CKD stage III-IV, paorxysmal atrial tachycardia, paroxysmal atrial fibrillation      Admitted for upper Gi bleed with melena, mild acute on chronic   diastolic CHF  Present on Admission: . Upper GI bleed -  - [place] score BUN >18.2  , Hg 2  , systolic BP <939    melena   CHF .  >1 Justifies admission and aggressive management      Modifying risk factors include:   Anticoagulation,       -    AIMS 65 =  TOTAL of 1            Reassuring      -  ER  Provider spoke to gastroenterology Dr. Collene Mares they will see patient in a.m. appreciate their consult   - serial CBC.    - Monitor for any recurrence,  evidence of hemodynamic instability or significant blood loss  - Transfuse as needed for hemoglobin below 7 or evidence of  life-threatening bleeding  - Establish at least 2 PIV and fluid resuscitate   - clear liquids for tonight keep nothing by mouth post midnight,   -  administer Protonix   drip    . Acute on chronic diastolic CHF (congestive heart failure) (HCC) avoid fluid overload, restart lasix when BP allows and  monitor fluid status  . A-fib ([place]) -           - CHA2DS2 vas score 4 : hold  current anticoagulation with  Eliquis,  Due to bleeding         -  Rate control:  Currently controlled with   Metoprolol,  will continue        - Rhythm control:  Continue amiodarone   . HTN (hypertension) - allow permissive HTn for tonight but likely will need to restart Lasix tomorrow . Hypothyroidism - - Check TSH continue home medications at current dose    Other plan as per orders.  DVT prophylaxis:  SCD      Code Status:  DNR/DNI  as per patient  I had personally discussed CODE STATUS with patient    Family Communication:   Family not at  Bedside  Disposition Plan:                             Back to current facility when stable                                             Would benefit from PT/OT eval prior to DC  Ordered                                      Consults called: Dr. Collene Mares with [place]   Admission status:  ED Disposition    ED Disposition Condition [place]: [place] [[id number]]  Level of Care: Telemetry [5]  Admit to tele based on following criteria: Acute CHF  Covid Evaluation: Confirmed COVID Negative  Diagnosis: Upper GI bleed [[id number]]  Admitting Physician: Toy Baker [[id number]]  Attending Physician: Toy Baker [[id number]]  Estimated length of stay: 3 - 4 days  Certification:: I certify this patient will need inpatient services for at least 2 midnights  PT Class (Do Not Modify): Inpatient [101]  PT Acc Code (Do Not Modify): Private [1]          inpatient     Expect 2 midnight stay secondary to severity of patient's current  illness including   hemodynamic instability despite optimal treatment ( hypotension)   Severe lab/radiological/exam abnormalities including:  Anemia,   and extensive comorbidities including:     CHF     CKD     malignancy, .   . Chronic anticoagulation  That are currently affecting medical management.   I expect  patient to be hospitalized for 2 midnights requiring inpatient medical care.  Patient is at high risk for adverse outcome (such as loss of life or disability) if not treated.  Indication for inpatient stay as follows:    Hemodynamic instability despite maximal medical therapy,    inability to maintain oral hydration    Need for operative/procedural  intervention    Need for  IV fluids,        Level of care     tele  For  24H      Precautions:  NONE  No active isolations  PPE: Used by the provider:   P100  eye Goggles,  Gloves       Cassandra Ray 01/08/2019, 8:59 PM    [facility]     after 2 AM please page floor coverage PA If 7AM-7PM, please contact the day team taking care of the patient using [email or web address]

## 2019-01-08 NOTE — ED Triage Notes (Signed)
States she started Eliquis  3 weeks ago and developed black stools last night. SoFT formed stools, PT IS VERY HOH, ONLY HAS RT HEARING AID IN

## 2019-01-09 ENCOUNTER — Inpatient Hospital Stay (HOSPITAL_COMMUNITY): Payer: Medicare Other | Admitting: Anesthesiology

## 2019-01-09 ENCOUNTER — Other Ambulatory Visit: Payer: Self-pay

## 2019-01-09 ENCOUNTER — Encounter (HOSPITAL_COMMUNITY): Payer: Self-pay | Admitting: *Deleted

## 2019-01-09 ENCOUNTER — Encounter (HOSPITAL_COMMUNITY)
Admission: EM | Disposition: A | Discharge status: Home / Self Care | Payer: Self-pay | Source: Home / Self Care | Attending: Internal Medicine

## 2019-01-09 HISTORY — PX: ESOPHAGOGASTRODUODENOSCOPY (EGD) WITH PROPOFOL: SHX5813

## 2019-01-09 LAB — COMPREHENSIVE METABOLIC PANEL
ALT: 12 U/L (ref 0–44)
AST: 20 U/L (ref 15–41)
Albumin: 2.8 g/dL — ABNORMAL LOW (ref 3.5–5.0)
Alkaline Phosphatase: 49 U/L (ref 38–126)
Anion gap: 9 (ref 5–15)
BUN: 40 mg/dL — ABNORMAL HIGH (ref 8–23)
CO2: 23 mmol/L (ref 22–32)
Calcium: 8.3 mg/dL — ABNORMAL LOW (ref 8.9–10.3)
Chloride: 109 mmol/L (ref 98–111)
Creatinine, Ser: 1.53 mg/dL — ABNORMAL HIGH (ref 0.44–1.00)
GFR calc Af Amer: 34 mL/min — ABNORMAL LOW (ref >60)
GFR calc non Af Amer: 29 mL/min — ABNORMAL LOW (ref >60)
Glucose, Bld: 91 mg/dL (ref 70–99)
Potassium: 3.1 mmol/L — ABNORMAL LOW (ref 3.5–5.1)
Sodium: 141 mmol/L (ref 135–145)
Total Bilirubin: 0.7 mg/dL (ref 0.3–1.2)
Total Protein: 5.4 g/dL — ABNORMAL LOW (ref 6.5–8.1)

## 2019-01-09 LAB — PHOSPHORUS: Phosphorus: 2.7 mg/dL (ref 2.5–4.6)

## 2019-01-09 LAB — CBC
HCT: 29.5 % — ABNORMAL LOW (ref 36.0–46.0)
Hemoglobin: 9.4 g/dL — ABNORMAL LOW (ref 12.0–15.0)
MCH: 31.3 pg (ref 26.0–34.0)
MCHC: 31.9 g/dL (ref 30.0–36.0)
MCV: 98.3 fL (ref 80.0–100.0)
Platelets: 150 10*3/uL (ref 150–400)
RBC: 3 MIL/uL — ABNORMAL LOW (ref 3.87–5.11)
RDW: 13.7 % (ref 11.5–15.5)
WBC: 7.8 10*3/uL (ref 4.0–10.5)
nRBC: 0 % (ref 0.0–0.2)

## 2019-01-09 LAB — BRAIN NATRIURETIC PEPTIDE: B Natriuretic Peptide: 113.6 pg/mL — ABNORMAL HIGH (ref 0.0–100.0)

## 2019-01-09 LAB — TSH: TSH: 0.032 u[IU]/mL — ABNORMAL LOW (ref 0.350–4.500)

## 2019-01-09 LAB — MAGNESIUM: Magnesium: 1.9 mg/dL (ref 1.7–2.4)

## 2019-01-09 LAB — T4, FREE: Free T4: 1.95 ng/dL — ABNORMAL HIGH (ref 0.61–1.12)

## 2019-01-09 LAB — ABO/RH: ABO/RH(D): O POS

## 2019-01-09 SURGERY — ESOPHAGOGASTRODUODENOSCOPY (EGD) WITH PROPOFOL
Anesthesia: Monitor Anesthesia Care | Laterality: Left

## 2019-01-09 MED ORDER — POTASSIUM CHLORIDE CRYS ER 20 MEQ PO TBCR
40.0000 meq | EXTENDED_RELEASE_TABLET | Freq: Once | ORAL | Status: AC
  Administered 2019-01-09: 40 meq via ORAL
  Filled 2019-01-09: qty 2

## 2019-01-09 MED ORDER — LACTATED RINGERS IV SOLN
INTRAVENOUS | Status: DC
  Administered 2019-01-09: 13:00:00 via INTRAVENOUS

## 2019-01-09 MED ORDER — SODIUM CHLORIDE 0.9 % IV SOLN: INTRAVENOUS | Status: DC

## 2019-01-09 MED ORDER — PROPOFOL 500 MG/50ML IV EMUL
INTRAVENOUS | Status: DC | PRN
  Administered 2019-01-09: 150 ug/kg/min via INTRAVENOUS

## 2019-01-09 MED ORDER — PANTOPRAZOLE SODIUM 40 MG PO TBEC
40.0000 mg | DELAYED_RELEASE_TABLET | Freq: Every day | ORAL | Status: DC
  Administered 2019-01-09 – 2019-01-10 (×2): 40 mg via ORAL
  Filled 2019-01-09 (×2): qty 1

## 2019-01-09 SURGICAL SUPPLY — 15 items

## 2019-01-09 NOTE — Op Note (Signed)
[facility] Patient Name: Cassandra Ray Procedure Date: 01/09/2019 MRN: [id number] Attending MD: Carol Ada , MD Date of Birth: [birth year] CSN: [id number] Age: 85 Admit Type: Inpatient Procedure:                Upper GI endoscopy Indications:              Melena Providers:                Carol Ada, MD, Vista Lawman, RN, Elspeth Cho                            Tech., Technician, Herbie Drape, CRNA Referring MD:              Medicines:                Propofol per Anesthesia Complications:            No immediate complications. Estimated Blood Loss:     Estimated blood loss: none. Procedure:                Pre-Anesthesia Assessment:                           - Prior to the procedure, a History and Physical                            was performed, and patient medications and                            allergies were reviewed. The patient's tolerance of                            previous anesthesia was also reviewed. The risks                            and benefits of the procedure and the sedation                            options and risks were discussed with the patient.                            All questions were answered, and informed consent                            was obtained. Prior Anticoagulants: The patient has                            taken no previous anticoagulant or antiplatelet                            agents. ASA Grade Assessment: III - A patient with                            severe systemic disease. After reviewing the risks  and benefits, the patient was deemed in                            satisfactory condition to undergo the procedure.                           - Sedation was administered by an anesthesia                            professional. Deep sedation was attained.                           After obtaining informed consent, the endoscope was                            passed under direct  vision. Throughout the                            procedure, the patient's blood pressure, pulse, and                            oxygen saturations were monitored continuously. The                            [id number]-[id number] ([id number]) Olympus gastroscope was                            introduced through the mouth, and advanced to the                            second part of duodenum. The upper GI endoscopy was                            accomplished without difficulty. The patient                            tolerated the procedure well. Scope In: Scope Out: Findings:      A 7 cm hiatal hernia was present.      Striped moderately erythematous mucosa without bleeding was found in the       gastric fundus.      The examined duodenum was normal.      Cameron's lesions were identified, but there was no overt bleeding. The       area appear to have some friability and this in conjunction with       Eliquis, is most likely the source of her recent melena and anemia. Impression:               - 7 cm hiatal hernia.                           - Erythematous mucosa in the gastric fundus.                           - Normal examined duodenum.                           -  No specimens collected. Moderate Sedation:      Not Applicable - Patient had care per Anesthesia. Recommendation:           - Return patient to hospital ward for ongoing care.                           - Resume regular diet.                           - Continue present medications.                           - Follow up with Dr. Earlean Shawl.                           - Consultation with Cardiology to risk assess the                            risks and benefits of restarting Eliquis. Procedure Code(s):        --- Professional ---                           [phone], Esophagogastroduodenoscopy, flexible,                            transoral; diagnostic, including collection of                            specimen(s) by brushing or washing, when  performed                            (separate procedure) Diagnosis Code(s):        --- Professional ---                           K44.9, Diaphragmatic hernia without obstruction or                            gangrene                           K31.89, Other diseases of stomach and duodenum                           K92.1, Melena (includes Hematochezia) CPT copyright 2019 American Medical Association. All rights reserved. The codes documented in this report are preliminary and upon coder review may  be revised to meet current compliance requirements. Carol Ada, MD Carol Ada, MD 01/09/2019 2:52:42 PM This report has been signed electronically. Number of Addenda: 0

## 2019-01-09 NOTE — Anesthesia Preprocedure Evaluation (Signed)
Anesthesia Evaluation  Patient identified by MRN, date of birth, ID band Patient awake    Reviewed: Allergy & Precautions, NPO status , Patient's Chart, lab work & pertinent test results  Airway Mallampati: II  TM Distance: >3 FB Neck ROM: Full    Dental no notable dental hx. (+) Dental Advisory Given   Pulmonary neg pulmonary ROS, former smoker,    Pulmonary exam normal        Cardiovascular hypertension, Pt. on home beta blockers +CHF  + dysrhythmias Atrial Fibrillation  Rhythm:Irregular Rate:Normal     Neuro/Psych negative neurological ROS  negative psych ROS   GI/Hepatic Neg liver ROS, hiatal hernia, PUD, GERD  ,  Endo/Other  Hypothyroidism   Renal/GU Renal InsufficiencyRenal disease  negative genitourinary   Musculoskeletal negative musculoskeletal ROS (+)   Abdominal   Peds negative pediatric ROS (+)  Hematology negative hematology ROS (+)   Anesthesia Other Findings   Reproductive/Obstetrics negative OB ROS                             Anesthesia Physical Anesthesia Plan  ASA: III  Anesthesia Plan: MAC   Post-op Pain Management:    Induction:   PONV Risk Score and Plan: 2 and Ondansetron and Propofol infusion  Airway Management Planned: Natural Airway  Additional Equipment:   Intra-op Plan:   Post-operative Plan:   Informed Consent: I have reviewed the patients History and Physical, chart, labs and discussed the procedure including the risks, benefits and alternatives for the proposed anesthesia with the patient or authorized representative who has indicated his/her understanding and acceptance.     Dental advisory given  Plan Discussed with: Anesthesiologist  Anesthesia Plan Comments:         Anesthesia Quick Evaluation

## 2019-01-09 NOTE — Progress Notes (Signed)
PROGRESS NOTE  Cassandra Ray ZYS:[id number] DOB: June 13, 1922 DOA: 01/08/2019 PCP: Lawerance Cruel, MD  HPI/Recap of past 24 hours: Cassandra Ray is a 85 y.o. female with medical history significant of HTN, hypothyroidism, breast CA, GERD, diastolic dysfunction/probable chronic diastolic CHF, chronic hyponatremia, pancreatic cyst, prior pleural effusion, CKD stage III-IV, paorxysmalatrialtachycardia, paroxysmal atrial fibrillation     Presented with few week history of black stools Her ankles and belly has started to swell again.  Her weight has gone up 3 to 4 pounds for the past 2 weeks she is also been having abdominal discomfort worse last night she had 3 more bowel movements today and started to feel a little bit better no associated chest pain or shortness of breath But she became concerned since she was taking Eliquis for her atrial fibrillation. Given her symptoms she was sent to emergency department  Recently admitted in July 2020 for generalized weakness found to have AKI and chest x-ray showed mild CHF versus atypical viral pneumonia developed rapid atrial fibrillation which she has a history of in the past but had converted to sinus rhythm from initially she was treated with diltiazem and amiodarone and then she was changed to home metoprolol At the time of discharge on 14 July K 3.9, Cr 2.16, Hgb 10.8, plt 147, Mg 2.1. Hospitalization troponin was noted to be elevated which felt to be most likely demand ischemia rather than ACS Since her discharge she has been seen by cardiology  Infectious risk factors:  Reports lightheadedness In  ER RAPID COVID TEST NEGATIVE    Regarding pertinent Chronic problems:        HTN on metoprolol   CHF diastolic - last echo 9/32/35 ejection fraction of 60-65%. Left ventricular diastolic Doppler parameters are indeterminate On 40 mg of Lasix a day   Pulmonary reactive disease on Symbicort and albuterol as needed     Hypothyroidism:  Recent Labs       Lab Results  Component Value Date   TSH 0.147 (L) 12/12/2018     on synthroid     A. Fib -  - CHA2DS2 vas score 4:  current  on anticoagulation with Eliquis,           -  Rate control:  Currently controlled with  Metoprolol,         - Rhythm control:   amiodarone,     CKD stage III - baseline Cr 2.5 recently    While in ER: Found to have hemoglobin 11.4 done about 1 g Hemoccult positive stool Was given Protonix and case discussed with Dr. Collene Mares  GI who will see patient in consult keep n.p.o. post midnight.  01/09/19: Patient was seen and examined at her bedside this morning.  Per bedside nurse no melena overnight.  Hemoglobin dropped from 11.4 yesterday to 9.4 today with baseline hemoglobin of 12.0.  She denies any abdominal pain or nausea.  EGD planned today.   Assessment/Plan: Active Problems:   HTN (hypertension)   Hypothyroidism   A-fib (HCC)   Acute on chronic diastolic CHF (congestive heart failure) ([place])   Upper GI bleed  Suspected upper GI bleed Presented with drop in hemoglobin, elevated BUN, and positive FOBT from 01/08/2019 Hemoglobin dropped this morning from 11.4 yesterday to 9.4. Baseline hemoglobin 12.0. Currently on Protonix drip N.p.o. after midnight with plan for EGD this morning  Elevated BUN likely secondary to suspected upper GI bleed Continue to closely monitor Transfuse with hemoglobin less than 7.0  Uncontrolled hypertension Ongoing permissive hypertension in the setting of suspected GI bleed with persistent hemoglobin drop Continue to closely monitor vital signs  Hypokalemia Potassium 3.1 Repleted Magnesium 1.9 Repeat BMP in the morning  Hypothyroidism Low TSH, obtain free T4 Currently on levothyroxine 100 mcg daily  CKD 3 Creatinine at baseline 1.5 with GFR of 30 Continue to avoid nephrotoxins Continue to monitor urine output  Diastolic CHF Last 2D echo done on 12/13/2018 showed preserved LVEF  60 to 65% Continue strict I's and O's and daily weight  Paroxysmal A. fib Continue to hold Eliquis Rate controlled Continue to closely monitor on telemetry  Physical debility PT to assessment when patient is more stable. Fall precautions   Risks: Patient is high risk for decompensation due to suspected upper GI bleed with ongoing drop in hemoglobin, multiple comorbidities and advanced age.  Patient requires at least 2 midnights for further evaluation and treatment of present condition.   DVT prophylaxis:  SCD      Code Status:  DNR/DNI  as per patient  I had personally discussed CODE STATUS with patient    Family Communication:   Family not at  Bedside   Disposition Plan:                             Back to current facility when stable                                             Would benefit from PT/OT eval prior to DC  Ordered                                      Consults called: Dr. Collene Mares with Gi     Objective: Vitals:   01/08/19 2020 01/08/19 2030 01/08/19 2119 01/09/19 0557  BP: 138/90 139/81 (!) 152/79 125/73  Pulse: (!) 107 92 94 (!) 116  Resp: (!) 28 (!) 26 16 18   Temp:   98.1 F (36.7 C) 98 F (36.7 C)  TempSrc:   Oral Oral  SpO2: 99% 99% 97% 95%  Weight:   76.8 kg   Height:   5\' 4"  (1.626 m)     Intake/Output Summary (Last 24 hours) at 01/09/2019 1148 Last data filed at 01/09/2019 1002 Gross per 24 hour  Intake 1327.92 ml  Output 950 ml  Net 377.92 ml   Filed Weights   01/08/19 2119  Weight: 76.8 kg    Exam:  . General: 85 y.o. year-old female well developed well nourished in no acute distress.  Alert and interactive. . Cardiovascular: Regular rate and rhythm with no rubs or gallops.  No thyromegaly or JVD noted.   Marland Kitchen Respiratory: Clear to auscultation with no wheezes or rales. Good inspiratory effort. . Abdomen: Soft nontender nondistended with normal bowel sounds x4 quadrants. . Musculoskeletal: No lower extremity edema. 2/4 pulses in all 4  extremities. Marland Kitchen Psychiatry: Mood is appropriate for condition and setting   Data Reviewed: CBC: Recent Labs  Lab 01/08/19 1712 01/09/19 0407  WBC 9.1 7.8  NEUTROABS 4.8  --   HGB 11.4* 9.4*  HCT 36.1 29.5*  MCV 98.9 98.3  PLT 172 937   Basic Metabolic Panel: Recent Labs  Lab 01/08/19 1712 01/09/19 0407  NA 140 141  K 3.4* 3.1*  CL 102 109  CO2 25 23  GLUCOSE 104* 91  BUN 49* 40*  CREATININE 1.70* 1.53*  CALCIUM 9.4 8.3*  MG  --  1.9  PHOS  --  2.7   GFR: Estimated Creatinine Clearance: 23 mL/min (A) (by C-G formula based on SCr of 1.53 mg/dL (H)). Liver Function Tests: Recent Labs  Lab 01/08/19 1712 01/09/19 0407  AST 25 20  ALT 16 12  ALKPHOS 67 49  BILITOT 0.5 0.7  PROT 6.8 5.4*  ALBUMIN 3.5 2.8*   No results for input(s): LIPASE, AMYLASE in the last 168 hours. No results for input(s): AMMONIA in the last 168 hours. Coagulation Profile: Recent Labs  Lab 01/08/19 1712  INR 1.3*   Cardiac Enzymes: No results for input(s): CKTOTAL, CKMB, CKMBINDEX, TROPONINI in the last 168 hours. BNP (last 3 results) No results for input(s): PROBNP in the last 8760 hours. HbA1C: No results for input(s): HGBA1C in the last 72 hours. CBG: No results for input(s): GLUCAP in the last 168 hours. Lipid Profile: No results for input(s): CHOL, HDL, LDLCALC, TRIG, CHOLHDL, LDLDIRECT in the last 72 hours. Thyroid Function Tests: Recent Labs    01/09/19 0407  TSH 0.032*  FREET4 1.95*   Anemia Panel: No results for input(s): VITAMINB12, FOLATE, FERRITIN, TIBC, IRON, RETICCTPCT in the last 72 hours. Urine analysis:    Component Value Date/Time   COLORURINE YELLOW 12/12/2018 2020   APPEARANCEUR CLEAR 12/12/2018 2020   LABSPEC 1.006 12/12/2018 2020   PHURINE 6.0 12/12/2018 2020   GLUCOSEU NEGATIVE 12/12/2018 2020   HGBUR NEGATIVE 12/12/2018 2020   BILIRUBINUR NEGATIVE 12/12/2018 2020   KETONESUR NEGATIVE 12/12/2018 2020   PROTEINUR NEGATIVE 12/12/2018 2020    UROBILINOGEN 0.2 04/24/2014 2257   NITRITE NEGATIVE 12/12/2018 2020   LEUKOCYTESUR NEGATIVE 12/12/2018 2020   Sepsis Labs: @LABRCNTIP (procalcitonin:4,lacticidven:4)  ) Recent Results (from the past 240 hour(s))  SARS Coronavirus 2 [facility] order, Performed in [facility] hospital lab) Nasopharyngeal Nasopharyngeal Swab     Status: None   Collection Time: 01/08/19  6:14 PM   Specimen: Nasopharyngeal Swab  Result Value Ref Range Status   SARS Coronavirus 2 NEGATIVE NEGATIVE Final    Comment: (NOTE) If result is NEGATIVE SARS-CoV-2 target nucleic acids are NOT DETECTED. The SARS-CoV-2 RNA is generally detectable in upper and lower  respiratory specimens during the acute phase of infection. The lowest  concentration of SARS-CoV-2 viral copies this assay can detect is 250  copies / mL. A negative result does not preclude SARS-CoV-2 infection  and should not be used as the sole basis for treatment or other  patient management decisions.  A negative result may occur with  improper specimen collection / handling, submission of specimen other  than nasopharyngeal swab, presence of viral mutation(s) within the  areas targeted by this assay, and inadequate number of viral copies  (<250 copies / mL). A negative result must be combined with clinical  observations, patient history, and epidemiological information. If result is POSITIVE SARS-CoV-2 target nucleic acids are DETECTED. The SARS-CoV-2 RNA is generally detectable in upper and lower  respiratory specimens dur ing the acute phase of infection.  Positive  results are indicative of active infection with SARS-CoV-2.  Clinical  correlation with patient history and other diagnostic information is  necessary to determine patient infection status.  Positive results do  not rule out bacterial infection or co-infection with other viruses. If result is PRESUMPTIVE POSTIVE SARS-CoV-2 nucleic acids  MAY BE PRESENT.   A presumptive positive  result was obtained on the submitted specimen  and confirmed on repeat testing.  While 2019 novel coronavirus  (SARS-CoV-2) nucleic acids may be present in the submitted sample  additional confirmatory testing may be necessary for epidemiological  and / or clinical management purposes  to differentiate between  SARS-CoV-2 and other Sarbecovirus currently known to infect humans.  If clinically indicated additional testing with an alternate test  methodology [id number]) is advised. The SARS-CoV-2 RNA is generally  detectable in upper and lower respiratory sp ecimens during the acute  phase of infection. The expected result is Negative. Fact Sheet for Patients:  StrictlyIdeas.no Fact Sheet for Healthcare Providers: BankingDealers.co.za This test is not yet approved or cleared by the [place] FDA and has been authorized for detection and/or diagnosis of SARS-CoV-2 by FDA under an Emergency Use Authorization (EUA).  This EUA will remain in effect (meaning this test can be used) for the duration of the COVID-19 declaration under Section 564(b)(1) of the Act, 21 U.S.C. section 360bbb-3(b)(1), unless the authorization is terminated or revoked sooner. Performed at [facility], [place] [address] [place], [place] [address]       Studies: Dg Chest Port 1 View  Result Date: 01/08/2019 CLINICAL DATA:  Started Eliquis 3 weeks prior, black stools last night. History of breast cancer EXAM: PORTABLE CHEST 1 VIEW COMPARISON:  Numerous priors, most recently 12/13/2018 FINDINGS: Lung volumes are diminished with hazy bibasilar opacities likely reflecting areas of atelectasis. No focal consolidation. No pneumothorax or effusion. There is chronic elevation of the right hemidiaphragm. Multiple surgical clips project over the right axilla and right chest. Cardiac silhouette remains enlarged with central venous prominence and cephalized,  indistinct vascularity. The aorta is calcified and tortuous. Degenerative changes are present in the imaged spine and shoulders. No acute osseous or soft tissue abnormality. IMPRESSION: Cardiomegaly with features of mild interstitial edema on a background of low volume. Electronically Signed   By: Lovena Le M.D.   On: 01/08/2019 17:00    Scheduled Meds: . amiodarone  200 mg Oral Daily  . levothyroxine  100 mcg Oral QAC breakfast  . lipase/protease/amylase  72,000 Units Oral TID  . metoprolol tartrate  12.5 mg Oral BID  . [START ON 01/12/2019] pantoprazole  40 mg Intravenous Q12H    Continuous Infusions: . sodium chloride 50 mL/hr at 01/08/19 2011  . pantoprozole (PROTONIX) infusion 8 mg/hr (01/08/19 2015)     LOS: 1 day     Kayleen Memos, MD [facility] Pager [phone]  If 7PM-7AM, please contact night-coverage [email or web address] Password [email or web address] 01/09/2019, 11:48 AM

## 2019-01-09 NOTE — Progress Notes (Signed)
PT Cancellation Note  Patient Details Name: Cassandra Ray MRN: [id number] DOB: 10/27/1922   Cancelled Treatment:    Reason Eval/Treat Not Completed: Patient at procedure or test/unavailable(pt at endoscopy procedure. Will follow.)  Philomena Doheny PT 01/09/2019  Acute Rehabilitation Services Pager [phone] Office [phone]

## 2019-01-09 NOTE — Interval H&P Note (Signed)
History and Physical Interval Note:  01/09/2019 2:34 PM  Cassandra Ray  has presented today for surgery, with the diagnosis of MELENA AND ANEMIA.  The various methods of treatment have been discussed with the patient and family. After consideration of risks, benefits and other options for treatment, the patient has consented to  Procedure(s): ESOPHAGOGASTRODUODENOSCOPY (EGD) WITH PROPOFOL (Left) as a surgical intervention.  The patient's history has been reviewed, patient examined, no change in status, stable for surgery.  I have reviewed the patient's chart and labs.  Questions were answered to the patient's satisfaction.     Drue Camera D

## 2019-01-09 NOTE — Progress Notes (Signed)
I spoke with Dr. [name], her cardiologist.  He is agreeable with holding her Eliquis.

## 2019-01-09 NOTE — Evaluation (Signed)
OT Cancellation Note  Patient Details Name: NIOKA THORINGTON MRN: [id number] DOB: 29-Sep-1922   Cancelled Treatment:    Reason Eval/Treat Not Completed: Patient at procedure or test/ unavailable   Pt is at endoscopy. Will likely check back tomorrow.  Hasson Gaspard 01/09/2019, 1:23 PM  Lesle Chris, OTR/L Acute Rehabilitation Services [phone] [facility] pager [phone] office 01/09/2019

## 2019-01-09 NOTE — Anesthesia Postprocedure Evaluation (Signed)
Anesthesia Post Note  Patient: Cassandra Ray  Procedure(s) Performed: ESOPHAGOGASTRODUODENOSCOPY (EGD) WITH PROPOFOL (Left )     Patient location during evaluation: Endoscopy Anesthesia Type: MAC Level of consciousness: awake and alert Pain management: pain level controlled Vital Signs Assessment: post-procedure vital signs reviewed and stable Respiratory status: spontaneous breathing, nonlabored ventilation, respiratory function stable and patient connected to nasal cannula oxygen Cardiovascular status: blood pressure returned to baseline and stable Postop Assessment: no apparent nausea or vomiting Anesthetic complications: no    Last Vitals:  Vitals:   01/09/19 1448 01/09/19 1500  BP: (!) 102/54 133/61  Pulse: 95 94  Resp: 18 18  Temp: 36.8 C   SpO2: 95% 99%    Last Pain:  Vitals:   01/09/19 1448  TempSrc: Oral  PainSc:                  Fusako Tanabe DANIEL

## 2019-01-09 NOTE — Transfer of Care (Signed)
Immediate Anesthesia Transfer of Care Note  Patient: Cassandra Ray  Procedure(s) Performed: ESOPHAGOGASTRODUODENOSCOPY (EGD) WITH PROPOFOL (Left )  Patient Location: PACU  Anesthesia Type:MAC  Level of Consciousness: sedated, patient cooperative and responds to stimulation  Airway & Oxygen Therapy: Patient Spontanous Breathing and Patient connected to nasal cannula oxygen  Post-op Assessment: Report given to RN and Post -op Vital signs reviewed and stable  Post vital signs: Reviewed and stable  Last Vitals:  Vitals Value Taken Time  BP    Temp    Pulse    Resp    SpO2      Last Pain:  Vitals:   01/09/19 1307  TempSrc: Oral  PainSc: 0-No pain         Complications: No apparent anesthesia complications

## 2019-01-10 LAB — CBC
HCT: 29.4 % — ABNORMAL LOW (ref 36.0–46.0)
Hemoglobin: 9.2 g/dL — ABNORMAL LOW (ref 12.0–15.0)
MCH: 31.3 pg (ref 26.0–34.0)
MCHC: 31.3 g/dL (ref 30.0–36.0)
MCV: 100 fL (ref 80.0–100.0)
Platelets: 143 10*3/uL — ABNORMAL LOW (ref 150–400)
RBC: 2.94 MIL/uL — ABNORMAL LOW (ref 3.87–5.11)
RDW: 14.1 % (ref 11.5–15.5)
WBC: 7.9 10*3/uL (ref 4.0–10.5)
nRBC: 0 % (ref 0.0–0.2)

## 2019-01-10 LAB — BASIC METABOLIC PANEL
Anion gap: 6 (ref 5–15)
BUN: 32 mg/dL — ABNORMAL HIGH (ref 8–23)
CO2: 25 mmol/L (ref 22–32)
Calcium: 8.5 mg/dL — ABNORMAL LOW (ref 8.9–10.3)
Chloride: 112 mmol/L — ABNORMAL HIGH (ref 98–111)
Creatinine, Ser: 1.63 mg/dL — ABNORMAL HIGH (ref 0.44–1.00)
GFR calc Af Amer: 31 mL/min — ABNORMAL LOW (ref >60)
GFR calc non Af Amer: 27 mL/min — ABNORMAL LOW (ref >60)
Glucose, Bld: 97 mg/dL (ref 70–99)
Potassium: 4 mmol/L (ref 3.5–5.1)
Sodium: 143 mmol/L (ref 135–145)

## 2019-01-10 MED ORDER — PANTOPRAZOLE SODIUM 40 MG PO TBEC: 40.0000 mg | DELAYED_RELEASE_TABLET | Freq: Every day | ORAL | 0 refills | Status: DC

## 2019-01-10 MED ORDER — FUROSEMIDE 40 MG PO TABS: 40.0000 mg | ORAL_TABLET | Freq: Every day | ORAL | Status: DC

## 2019-01-10 MED ORDER — LEVOTHYROXINE SODIUM 75 MCG PO TABS: 75.0000 ug | ORAL_TABLET | Freq: Every day | ORAL | 0 refills | Status: DC

## 2019-01-10 MED ORDER — LEVOTHYROXINE SODIUM 50 MCG PO TABS: 75.0000 ug | ORAL_TABLET | Freq: Every day | ORAL | Status: DC

## 2019-01-10 MED ORDER — FUROSEMIDE 40 MG PO TABS: 40.0000 mg | ORAL_TABLET | Freq: Every day | ORAL | 0 refills | Status: DC

## 2019-01-10 NOTE — Discharge Summary (Signed)
Discharge Summary  Cassandra Ray XTG:[id number] DOB: 30-Jan-1923  PCP: Lawerance Cruel, MD  Admit date: 01/08/2019 Discharge date: 01/10/2019  Time spent: 35 minutes  Recommendations for Outpatient Follow-up:  1. Follow-up with your cardiologist Dr. [name] 2. Continue to hold off Eliquis and to otherwise recommended by your cardiologist 3. Follow-up with your GI provider Dr. Earlean Shawl 4. Take your medications as prescribed 5. Continue physical therapy with home health PT 6. Fall precautions.  Discharge Diagnoses:  Active Hospital Problems   Diagnosis Date Noted  . Upper GI bleed 01/08/2019  . Acute on chronic diastolic CHF (congestive heart failure) ([facility] [place]) 02/08/2016  . A-fib ([place]) 05/09/2013  . HTN (hypertension)   . Hypothyroidism     Resolved Hospital Problems  No resolved problems to display.    Discharge Condition: Stable  Diet recommendation: Resume previous diet  Vitals:   01/10/19 0413 01/10/19 1223  BP: 138/69 133/66  Pulse: 84 70  Resp: 20 18  Temp: 98.2 F (36.8 C) 97.7 F (36.5 C)  SpO2: 99% 99%    History of present illness:   Cassandra Ray a 85 y.o.femalewith medical history significant of HTN, hypothyroidism, breast CA, GERD, diastolic dysfunction/probable chronic diastolic CHF, chronic hyponatremia, pancreatic cyst, prior pleural effusion, CKD stage III-IV, paorxysmalatrialtachycardia, paroxysmal atrial fibrillation   Presented withfew week history of black stools Her ankles and belly has started to swell again. Her weight has gone up 3 to 4 pounds for the past 2 weeks she is also been having abdominal discomfort worse last night she had 3 more bowel movements today and started to feel a little bit better no associated chest pain or shortness of breath But she became concerned since she was taking Eliquis for her atrial fibrillation. Given her symptoms she was sent to emergency department  Recently admitted in July 2020 for  generalized weakness found to have AKI and chest x-ray showed mild CHF versus atypical viral pneumonia developed rapid atrial fibrillation which she has a history of in the past but had converted to sinus rhythm from initially she was treated with diltiazem and amiodarone and then she was changed to home metoprolol At the time of discharge on 14 JulyK 3.9, Cr 2.16, Hgb 10.8, plt 147, Mg 2.1. Hospitalization troponin was noted to be elevated which felt to be most likely demand ischemia rather than ACS Since her discharge she has been seen by cardiology  Infectious risk factors: Reportslightheadedness In ER RAPID COVID TEST NEGATIVE   Regarding pertinent Chronic problems:    HTN onmetoprolol  CHFdiastolic - last echo 2/70/35KKXFGHWE fraction of 60-65%. Left ventricular diastolic Doppler parameters are indeterminate On 40 mg of Lasix a day  Pulmonaryreactive disease on Symbicort and albuterol as needed   Hypothyroidism: Recent Labs       Lab Results  Component Value Date   TSH 0.147 (L) 12/12/2018    on synthroid   A. Fib - - CHA2DS2 vas score 4: current on anticoagulation with Eliquis,   - Rate control: Currently controlled with Metoprolol,  - Rhythm control: amiodarone,   CKDstage III - baseline Cr 2.5 recently   While in ER: Found to have hemoglobin 11.4 done about 1 g Hemoccult positive stool Was given Protonix and case discussed with Dr.MannGI who will see patient in consult keep n.p.o. post midnight.  01/09/19: EGD completed by Dr. Benson Norway and recommended to hold off eliquis, he spoke with Dr. [name] about her Eliquis, She can remain off of the medication for now, but  further discussions with the medication will be made as an outpatient with Dr. [name].    01/10/19: Patient was seen in her bedside this morning.  She has no new complaints.  She wants to go home.  On the day of discharge, the patient was  hemodynamically stable.  She will need to follow-up with her cardiologist and her GI provider post hospitalization.Marland Kitchen     Hospital Course:  Active Problems:   HTN (hypertension)   Hypothyroidism   A-fib ([facility])   Acute on chronic diastolic CHF (congestive heart failure) ([place])   Upper GI bleed  Suspected upper GI bleed Presented with drop in hemoglobin, elevated BUN, and positive FOBT from 01/08/2019 Hemoglobin dropped on 01/09/2019 from 11.4 to 9.4. Baseline hemoglobin 12.0. Received Protonix drip EGD done on 01/09/2019 revealed: Cameron's lesions. Recommendations to hold off Eliquis. Follow-up with your GI provider Dr. Earlean Shawl. Continue PPI  Elevated BUN likely secondary to suspected upper GI bleed Anemia relatively stable Hemoglobin stable at 9.2 No sign of overt bleeding at this time post EGD  Resolved uncontrolled hypertension Blood pressure is at goal Resume lower dose of Lasix 40 mg daily  Resolved hypokalemia post repletion Potassium 4.0 on 01/10/2019.    Hypothyroidism Low TSH, and elevated T4 Cutdown dose of levothyroxine from 100 mcg daily to 75 mcg daily Follow-up with your primary care provider and repeat TSH in 4 to 6 weeks.  CKD 3 Creatinine at baseline 1.5 with GFR of 30 Creatinine on 01/10/2019 was 1.63 with GFR 27 Continue to avoid nephrotoxins Follow-up with your primary care provider within a week. Repeat BMP on Wednesday, 07/07/5425  Diastolic CHF Last 2D echo done on 12/13/2018 showed preserved LVEF 60 to 65% Continue strict I's and O's and daily weight Restart p.o. Lasix at a reduced dose 40 mg daily Monitor electrolytes and renal function while on diuretics, repeat BMP on Wednesday 01/15/2019.  Paroxysmal A. fib Continue to hold Eliquis Rate controlled on beta-blocker Follow-up with your cardiologist  Physical debility PT recommended home health PT Fall precautions    Code Status:DNR/DNI as per patient  I had personally discussed  CODE STATUS with patient    Consults called:Dr. Collene Mares with Gi     Discharge Exam: BP 133/66 (BP Location: Left Arm)   Pulse 70   Temp 97.7 F (36.5 C) (Oral)   Resp 18   Ht 5\' 4"  (1.626 m)   Wt 76.8 kg   LMP  (LMP Unknown)   SpO2 99%   BMI 29.06 kg/m  . General: 85 y.o. year-old female well developed well nourished in no acute distress.  Alert and interactive. . Cardiovascular: Regular rate and rhythm with no rubs or gallops.  No thyromegaly or JVD noted.   Marland Kitchen Respiratory: Clear to auscultation with no wheezes or rales. Good inspiratory effort. . Abdomen: Soft nontender nondistended with normal bowel sounds x4 quadrants. . Musculoskeletal: No lower extremity edema. 2/4 pulses in all 4 extremities. . Skin: No ulcerative lesions noted or rashes, . Psychiatry: Mood is appropriate for condition and setting  Discharge Instructions You were cared for by a hospitalist during your hospital stay. If you have any questions about your discharge medications or the care you received while you were in the hospital after you are discharged, you can call the unit and asked to speak with the hospitalist on call if the hospitalist that took care of you is not available. Once you are discharged, your primary care physician will handle any further medical issues. Please  note that NO REFILLS for any discharge medications will be authorized once you are discharged, as it is imperative that you return to your primary care physician (or establish a relationship with a primary care physician if you do not have one) for your aftercare needs so that they can reassess your need for medications and monitor your lab values.   Allergies as of 01/10/2019      Reactions   Tape Other (See Comments)   Patient prefers easy-release tape      Medication List    STOP taking these medications   acetaminophen 500 MG tablet Commonly known as: TYLENOL   apixaban 2.5 MG Tabs  tablet Commonly known as: ELIQUIS   famotidine 40 MG/5ML suspension Commonly known as: PEPCID     TAKE these medications   Align 4 MG Caps Take 4 mg by mouth daily.   amiodarone 200 MG tablet Commonly known as: PACERONE Take 1 tablet (200 mg total) by mouth daily.   B-complex with vitamin C tablet Take 1 tablet by mouth daily.   CITRACAL +D3 PO Take 1 tablet by mouth daily.   furosemide 40 MG tablet Commonly known as: LASIX Take 1 tablet (40 mg total) by mouth daily. What changed:   how much to take  additional instructions   Glucosamine HCl 1500 MG Tabs Take 3,000 mg by mouth at bedtime.   levalbuterol 45 MCG/ACT inhaler Commonly known as: XOPENEX HFA Inhale 2 puffs into the lungs every 4 (four) hours as needed for wheezing.   levothyroxine 75 MCG tablet Commonly known as: SYNTHROID Take 1 tablet (75 mcg total) by mouth daily before breakfast. What changed:   medication strength  how much to take   lipase/protease/amylase 36000 UNITS Cpep capsule Commonly known as: CREON Take 72,000 Units by mouth 3 (three) times daily.   loperamide 2 MG tablet Commonly known as: IMODIUM A-D Take 2 mg by mouth daily as needed for diarrhea or loose stools.   metoprolol tartrate 25 MG tablet Commonly known as: LOPRESSOR Take 0.5 tablets (12.5 mg total) by mouth 2 (two) times daily.   nitroGLYCERIN 0.4 MG SL tablet Commonly known as: NITROSTAT Place 0.4 mg under the tongue every 5 (five) minutes as needed for chest pain.   pantoprazole 40 MG tablet Commonly known as: PROTONIX Take 1 tablet (40 mg total) by mouth daily. Start taking on: January 11, 2019   Vitamin D3 50 MCG (2000 UT) Tabs Take 2,000 Units by mouth daily.      Allergies  Allergen Reactions  . Tape Other (See Comments)    Patient prefers easy-release tape   Follow-up Information    Jerline Pain, MD. Call in 1 day(s).   Specialty: Cardiology Why: Please call for a post hospital follow-up  appointment. Contact information: [address] [place] [address] [phone]        Richmond Campbell, MD. Call in 1 day(s).   Specialty: Gastroenterology Why: Please call for a post hospital follow-up appointment. Contact information: [place] [address] [phone]            The results of significant diagnostics from this hospitalization (including imaging, microbiology, ancillary and laboratory) are listed below for reference.    Significant Diagnostic Studies: US Renal  Result Date: 12/13/2018 CLINICAL DATA:  Acute renal failure. EXAM: RENAL / URINARY TRACT ULTRASOUND COMPLETE COMPARISON:  Ultrasound 12/11/2014 FINDINGS: Right Kidney: Renal measurements: 8.6 x 4.9 x 4.8 cm = volume: 105 mL.  Normal echogenicity. Cortical thinning throughout the right kidney without hydronephrosis. No suspicious renal lesion. Left Kidney: Renal measurements: 8.6 x 4.4 x 4.7 cm = volume: 92 mL. Normal echogenicity. Cortical thinning without hydronephrosis. No suspicious renal lesion. Bladder: Appears normal for degree of bladder distention. IMPRESSION: 1. Negative for hydronephrosis. 2. Cortical thinning in both kidneys. Electronically Signed   By: Markus Daft M.D.   On: 12/13/2018 12:15   Dg Chest Port 1 View  Result Date: 01/08/2019 CLINICAL DATA:  Started Eliquis 3 weeks prior, black stools last night. History of breast cancer EXAM: PORTABLE CHEST 1 VIEW COMPARISON:  Numerous priors, most recently 12/13/2018 FINDINGS: Lung volumes are diminished with hazy bibasilar opacities likely reflecting areas of atelectasis. No focal consolidation. No pneumothorax or effusion. There is chronic elevation of the right hemidiaphragm. Multiple surgical clips project over the right axilla and right chest. Cardiac silhouette remains enlarged with central venous prominence and cephalized, indistinct vascularity. The aorta is calcified and tortuous. Degenerative changes are  present in the imaged spine and shoulders. No acute osseous or soft tissue abnormality. IMPRESSION: Cardiomegaly with features of mild interstitial edema on a background of low volume. Electronically Signed   By: Lovena Le M.D.   On: 01/08/2019 17:00   Dg Chest Port 1 View  Result Date: 12/13/2018 CLINICAL DATA:  Shortness of breath EXAM: PORTABLE CHEST 1 VIEW COMPARISON:  December 12, 2018. FINDINGS: There is no appreciable edema or consolidation. There is slight left base atelectasis. Heart is mildly enlarged with pulmonary vascularity normal. No adenopathy. There is aortic atherosclerosis. Bones are osteoporotic. IMPRESSION: Mild left base atelectasis. No edema or consolidation. Heart mildly enlarged. Aortic Atherosclerosis (ICD10-I70.0). Bones osteoporotic. Electronically Signed   By: Lowella Grip III M.D.   On: 12/13/2018 08:59   Dg Chest Port 1 View  Result Date: 12/12/2018 CLINICAL DATA:  Rapid and irregular heart rate EXAM: PORTABLE CHEST 1 VIEW COMPARISON:  Chest x-rays dated 05/23/2018 and 02/01/2016. FINDINGS: Heart size and mediastinal contours are stable. Central parabronchial thickening/edema bilaterally. Probable cephalization. No confluent opacity to suggest consolidating pneumonia. No pleural effusion or pneumothorax seen. Osseous structures about the chest are unremarkable. IMPRESSION: Acute bronchitic change. This could represent mild CHF or atypical/viral pneumonia. No evidence of consolidating pneumonia or overt alveolar pulmonary edema. Electronically Signed   By: Franki Cabot M.D.   On: 12/12/2018 13:50    Microbiology: Recent Results (from the past 240 hour(s))  SARS Coronavirus 2 [facility] order, Performed in [facility] hospital lab) Nasopharyngeal Nasopharyngeal Swab     Status: None   Collection Time: 01/08/19  6:14 PM   Specimen: Nasopharyngeal Swab  Result Value Ref Range Status   SARS Coronavirus 2 NEGATIVE NEGATIVE Final    Comment: (NOTE) If result is NEGATIVE  SARS-CoV-2 target nucleic acids are NOT DETECTED. The SARS-CoV-2 RNA is generally detectable in upper and lower  respiratory specimens during the acute phase of infection. The lowest  concentration of SARS-CoV-2 viral copies this assay can detect is 250  copies / mL. A negative result does not preclude SARS-CoV-2 infection  and should not be used as the sole basis for treatment or other  patient management decisions.  A negative result may occur with  improper specimen collection / handling, submission of specimen other  than nasopharyngeal swab, presence of viral mutation(s) within the  areas targeted by this assay, and inadequate number of viral copies  (<250 copies / mL). A negative result must be combined with clinical  observations, patient  history, and epidemiological information. If result is POSITIVE SARS-CoV-2 target nucleic acids are DETECTED. The SARS-CoV-2 RNA is generally detectable in upper and lower  respiratory specimens dur ing the acute phase of infection.  Positive  results are indicative of active infection with SARS-CoV-2.  Clinical  correlation with patient history and other diagnostic information is  necessary to determine patient infection status.  Positive results do  not rule out bacterial infection or co-infection with other viruses. If result is PRESUMPTIVE POSTIVE SARS-CoV-2 nucleic acids MAY BE PRESENT.   A presumptive positive result was obtained on the submitted specimen  and confirmed on repeat testing.  While 2019 novel coronavirus  (SARS-CoV-2) nucleic acids may be present in the submitted sample  additional confirmatory testing may be necessary for epidemiological  and / or clinical management purposes  to differentiate between  SARS-CoV-2 and other Sarbecovirus currently known to infect humans.  If clinically indicated additional testing with an alternate test  methodology [phone]) is advised. The SARS-CoV-2 RNA is generally  detectable in upper  and lower respiratory sp ecimens during the acute  phase of infection. The expected result is Negative. Fact Sheet for Patients:  StrictlyIdeas.no Fact Sheet for Healthcare Providers: BankingDealers.co.za This test is not yet approved or cleared by the [place] FDA and has been authorized for detection and/or diagnosis of SARS-CoV-2 by FDA under an Emergency Use Authorization (EUA).  This EUA will remain in effect (meaning this test can be used) for the duration of the COVID-19 declaration under Section 564(b)(1) of the Act, 21 U.S.C. section 360bbb-3(b)(1), unless the authorization is terminated or revoked sooner. Performed at [facility], [place] [address] [place], [place] [address]      Labs: Basic Metabolic Panel: Recent Labs  Lab 01/08/19 1712 01/09/19 0407 01/10/19 0540  NA 140 141 143  K 3.4* 3.1* 4.0  CL 102 109 112*  CO2 25 23 25   GLUCOSE 104* 91 97  BUN 49* 40* 32*  CREATININE 1.70* 1.53* 1.63*  CALCIUM 9.4 8.3* 8.5*  MG  --  1.9  --   PHOS  --  2.7  --    Liver Function Tests: Recent Labs  Lab 01/08/19 1712 01/09/19 0407  AST 25 20  ALT 16 12  ALKPHOS 67 49  BILITOT 0.5 0.7  PROT 6.8 5.4*  ALBUMIN 3.5 2.8*   No results for input(s): LIPASE, AMYLASE in the last 168 hours. No results for input(s): AMMONIA in the last 168 hours. CBC: Recent Labs  Lab 01/08/19 1712 01/09/19 0407 01/10/19 0540  WBC 9.1 7.8 7.9  NEUTROABS 4.8  --   --   HGB 11.4* 9.4* 9.2*  HCT 36.1 29.5* 29.4*  MCV 98.9 98.3 100.0  PLT 172 150 143*   Cardiac Enzymes: No results for input(s): CKTOTAL, CKMB, CKMBINDEX, TROPONINI in the last 168 hours. BNP: BNP (last 3 results) Recent Labs    05/23/18 1608 12/12/18 1225 01/09/19 0407  BNP 146.6* 899.9* 113.6*    ProBNP (last 3 results) No results for input(s): PROBNP in the last 8760 hours.  CBG: No results for input(s): GLUCAP in the last 168 hours.      Signed:  Kayleen Memos, MD [facility] 01/10/2019, 2:02 PM

## 2019-01-10 NOTE — TOC Progression Note (Addendum)
Transition of Care [facility]) - Progression Note    Patient Details  Name: Cassandra Ray MRN: [id number] Date of Birth: 1923/03/04  [facility] CM/SW Contact  Purcell Mouton, RN Phone Number: 01/10/2019, 12:03 PM  Clinical Narrative:    Pt states that she will use [facility] for HHPT and will continue there. A call to [facility] asked for PT notes and order to be faxed to [phone].         Expected Discharge Plan and Services           Expected Discharge Date: 01/13/19                                     Social Determinants of Health (SDOH) Interventions    Readmission Risk Interventions No flowsheet data found.

## 2019-01-10 NOTE — Evaluation (Signed)
Physical Therapy One Time Evaluation Patient Details Name: Cassandra Ray MRN: [id number] DOB: March 02, 1923 Today's Date: 01/10/2019   History of Present Illness  85 y.o. female with medical history significant of HTN, hypothyroidism, breast CA, GERD, CHF, chronic hyponatremia, pancreatic cyst, CKD stage III-IV, paorxysmal atrial tachycardia, paroxysmal atrial fibrillation and admitted for suspected upper GI bleed.  Clinical Impression  Patient evaluated by Physical Therapy with no further acute PT needs identified. All education has been completed and the patient has no further questions.  Pt not enthusiastic about mobilizing however agreeable to ambulate in order for preparation of d/c.  Pt eager for d/c back to ILF.  Pt feels she is able to return to ILF with her spouse and has been performing HHPT prior this admission (agreeable to continue again upon return home). See below for any follow-up Physical Therapy or equipment needs. PT is signing off. Thank you for this referral.     Follow Up Recommendations Home health PT    Equipment Recommendations  None recommended by PT    Recommendations for Other Services       Precautions / Restrictions Precautions Precautions: Fall      Mobility  Bed Mobility Overal bed mobility: Needs Assistance Bed Mobility: Supine to Sit;Sit to Supine     Supine to sit: HOB elevated;Supervision Sit to supine: Supervision   General bed mobility comments: slow and effortful  Transfers Overall transfer level: Needs assistance Equipment used: Rolling walker (2 wheeled) Transfers: Sit to/from Stand Sit to Stand: Min guard         General transfer comment: min/guard for safety  Ambulation/Gait Ambulation/Gait assistance: Min guard Gait Distance (Feet): 40 Feet Assistive device: Rolling walker (2 wheeled) Gait Pattern/deviations: Step-through pattern;Decreased stride length     General Gait Details: pt self limited distance, only c/o  fatigue and generalized weakness; min/guard for safety  Stairs            Wheelchair Mobility    Modified Rankin (Stroke Patients Only)       Balance Overall balance assessment: Needs assistance(denies any recent falls)         Standing balance support: Bilateral upper extremity supported Standing balance-Leahy Scale: Poor Standing balance comment: reliant on UEs                             Pertinent Vitals/Pain Pain Assessment: No/denies pain    Home Living   Living Arrangements: Spouse/significant other Available Help at Discharge: Family;Available 24 hours/day Type of Home: Independent living facility Home Access: [place]: One level Home Equipment: [place] - 2 wheels;Walker - 4 wheels;Grab bars - tub/shower;Grab bars - toilet Additional Comments: pt uses rollator in facility and RW in community.  pt reports having HHPT since her last admission (about 3 weeks)    Prior Function Level of Independence: Independent with assistive device(s)         Comments: uses RW for ambulation of limited community distances, independent with bathing and dressing, facility provides meals and cleaning     Hand Dominance        Extremity/Trunk Assessment        Lower Extremity Assessment Lower Extremity Assessment: Generalized weakness    Cervical / Trunk Assessment Cervical / Trunk Assessment: Kyphotic  Communication   Communication: HOH  Cognition Arousal/Alertness: Awake/alert Behavior During Therapy: WFL for tasks assessed/performed Overall Cognitive Status: Within Functional Limits for tasks assessed  General Comments      Exercises     Assessment/Plan    PT Assessment All further PT needs can be met in the next venue of care  PT Problem List Decreased strength;Decreased mobility;Decreased activity tolerance;Decreased knowledge of use of DME       PT Treatment  Interventions      PT Goals (Current goals can be found in the Care Plan section)  Acute Rehab PT Goals PT Goal Formulation: All assessment and education complete, DC therapy    Frequency     Barriers to discharge        Co-evaluation               AM-PAC PT "6 Clicks" Mobility  Outcome Measure Help needed turning from your back to your side while in a flat bed without using bedrails?: A Little Help needed moving from lying on your back to sitting on the side of a flat bed without using bedrails?: A Little Help needed moving to and from a bed to a chair (including a wheelchair)?: A Little Help needed standing up from a chair using your arms (e.g., wheelchair or bedside chair)?: A Little Help needed to walk in hospital room?: A Little Help needed climbing 3-5 steps with a railing? : A Little 6 Click Score: 18    End of Session Equipment Utilized During Treatment: Gait belt Activity Tolerance: Patient tolerated treatment well Patient left: in bed;with bed alarm set;with call bell/phone within reach Nurse Communication: Mobility status PT Visit Diagnosis: Other abnormalities of gait and mobility (R26.89)    Time: 8930-6840 PT Time Calculation (min) (ACUTE ONLY): 12 min   Charges:   PT Evaluation $PT Eval Low Complexity: [place], PT, DPT Acute Rehabilitation Services Office: [phone] Pager: [phone]  Trena Platt 01/10/2019, 12:18 PM

## 2019-01-10 NOTE — Progress Notes (Signed)
Subjective: No complaints.  Feeling well.  Objective: Vital signs in last 24 hours: Temp:  [97.7 F (36.5 C)-98.9 F (37.2 C)] 98.2 F (36.8 C) (08/07 0413) Pulse Rate:  [84-102] 84 (08/07 0413) Resp:  [18-21] 20 (08/07 0413) BP: (102-146)/(54-79) 138/69 (08/07 0413) SpO2:  [94 %-99 %] 99 % (08/07 0413) Last BM Date: 01/10/19  Intake/Output from previous day: 08/06 0701 - 08/07 0700 In: 760 [P.O.:360; I.V.:400] Out: 800 [Urine:800] Intake/Output this shift: Total I/O In: 160 [P.O.:160] Out: 200 [Urine:200]  General appearance: alert and no distress GI: soft, non-tender; bowel sounds normal; no masses,  no organomegaly  Lab Results: Recent Labs    01/08/19 1712 01/09/19 0407 01/10/19 0540  WBC 9.1 7.8 7.9  HGB 11.4* 9.4* 9.2*  HCT 36.1 29.5* 29.4*  PLT 172 150 143*   BMET Recent Labs    01/08/19 1712 01/09/19 0407 01/10/19 0540  NA 140 141 143  K 3.4* 3.1* 4.0  CL 102 109 112*  CO2 25 23 25   GLUCOSE 104* 91 97  BUN 49* 40* 32*  CREATININE 1.70* 1.53* 1.63*  CALCIUM 9.4 8.3* 8.5*   LFT Recent Labs    01/09/19 0407  PROT 5.4*  ALBUMIN 2.8*  AST 20  ALT 12  ALKPHOS 49  BILITOT 0.7   PT/INR Recent Labs    01/08/19 1712  LABPROT 15.9*  INR 1.3*   Hepatitis Panel No results for input(s): HEPBSAG, HCVAB, HEPAIGM, HEPBIGM in the last 72 hours. C-Diff No results for input(s): CDIFFTOX in the last 72 hours. Fecal Lactopherrin No results for input(s): FECLLACTOFRN in the last 72 hours.  Studies/Results: Dg Chest Port 1 View  Result Date: 01/08/2019 CLINICAL DATA:  Started Eliquis 3 weeks prior, black stools last night. History of breast cancer EXAM: PORTABLE CHEST 1 VIEW COMPARISON:  Numerous priors, most recently 12/13/2018 FINDINGS: Lung volumes are diminished with hazy bibasilar opacities likely reflecting areas of atelectasis. No focal consolidation. No pneumothorax or effusion. There is chronic elevation of the right hemidiaphragm. Multiple  surgical clips project over the right axilla and right chest. Cardiac silhouette remains enlarged with central venous prominence and cephalized, indistinct vascularity. The aorta is calcified and tortuous. Degenerative changes are present in the imaged spine and shoulders. No acute osseous or soft tissue abnormality. IMPRESSION: Cardiomegaly with features of mild interstitial edema on a background of low volume. Electronically Signed   By: Lovena Le M.D.   On: 01/08/2019 17:00    Medications:  Scheduled: . amiodarone  200 mg Oral Daily  . levothyroxine  100 mcg Oral QAC breakfast  . lipase/protease/amylase  72,000 Units Oral TID  . metoprolol tartrate  12.5 mg Oral BID  . pantoprazole  40 mg Oral Daily   Continuous: . sodium chloride 50 mL/hr at 01/08/19 2011    Assessment/Plan: 1) Cameron's lesions. 2) Anemia - relatively stable. 3) Afib.   Again, I spoke with Dr. [name] about her Eliquis.  She can remain off of the medication for now, but further discussions with the medication will be made as an outpatient with Dr. [name].  From the GI standpoint she appears to be stable.  Plan: 1) Okay to D/C home. 2) Follow up with Dr. [name], cardiology. 3) Follow up with Dr. Earlean Shawl, her primary GI.  LOS: 2 days   Melodee Lupe D 01/10/2019, 12:13 PM

## 2019-01-10 NOTE — Care Management Important Message (Signed)
Important Message  Patient Details IM Letter given to [name] RN to present to the Patient Name: Cassandra Ray MRN: [id number] Date of Birth: Nov 12, 1922   Medicare Important Message Given:  Yes     Kerin Salen 01/10/2019, 10:17 AM

## 2019-01-10 NOTE — Discharge Instructions (Signed)
Gastrointestinal Bleeding Gastrointestinal (GI) bleeding is bleeding somewhere along the path that food travels through the body (digestive tract). This path is anywhere between the mouth and the opening of the butt (anus). You may have blood in your poop (stool) or have black poop. If you throw up (vomit), there may be blood in it. This condition can be mild, serious, or even life-threatening. If you have a lot of bleeding, you may need to stay in the hospital. What are the causes? This condition may be caused by:  Irritation and swelling of the esophagus (esophagitis). The esophagus is part of the body that moves food from your mouth to your stomach.  Swollen veins in the butt (hemorrhoids).  Areas of painful tearing in the opening of the butt (anal fissures). These are often caused by passing hard poop.  Pouches that form on the colon over time (diverticulosis).  Irritation and swelling (diverticulitis) in areas where pouches have formed on the colon.  Growths (polyps) or cancer. Colon cancer often starts out as growths that are not cancer.  Irritation of the stomach lining (gastritis).  Sores (ulcers) in the stomach. What increases the risk? You are more likely to develop this condition if you:  Have a certain type of infection in your stomach (Helicobacter pylori infection).  Take certain medicines.  Smoke.  Drink alcohol. What are the signs or symptoms? Common symptoms of this condition include:  Throwing up (vomiting) material that has bright red blood in it. It may look like coffee grounds.  Changes in your poop. The poop may: ? Have red blood in it. ? Be black, look like tar, and smell stronger than normal. ? Be red.  Pain or cramping in the belly (abdomen). How is this treated? Treatment for this condition depends on the cause of the bleeding. For example:  Sometimes, the bleeding can be stopped during a procedure that is done to find the problem (endoscopy or  colonoscopy).  Medicines can be used to: ? Help control irritation, swelling, or infection. ? Reduce acid in your stomach.  Certain problems can be treated with: ? Creams. ? Medicines that are put in the butt (suppositories). ? Warm baths.  Surgery is sometimes needed.  If you lose a lot of blood, you may need a blood transfusion. If bleeding is mild, you may be allowed to go home. If there is a lot of bleeding, you will need to stay in the hospital. Follow these instructions at home:   Take over-the-counter and prescription medicines only as told by your doctor.  Eat foods that have a lot of fiber in them. These foods include beans, whole grains, and fresh fruits and vegetables. You can also try eating 1-3 prunes each day.  Drink enough fluid to keep your pee (urine) pale yellow.  Keep all follow-up visits as told by your doctor. This is important. Contact a doctor if:  Your symptoms do not get better. Get help right away if:  Your bleeding does not stop.  You feel dizzy or you pass out (faint).  You feel weak.  You have very bad cramps in your back or belly.  You pass large clumps of blood (clots) in your poop.  Your symptoms are getting worse.  You have chest pain or fast heartbeats. Summary  GI bleeding is bleeding somewhere along the path that food travels through the body (digestive tract).  This bleeding can be caused by many things. Treatment depends on the cause of the bleeding.  Take medicines only as told by your doctor.  Keep all follow-up visits as told by your doctor. This is important. This information is not intended to replace advice given to you by your health care provider. Make sure you discuss any questions you have with your health care provider. Document Released: 02/29/2008 Document Revised: 01/02/2018 Document Reviewed: 01/02/2018 Elsevier Patient Education  2020 [employer].   Upper Gastrointestinal Bleeding  Upper  gastrointestinal (GI) bleeding is bleeding from the swallowing tube (esophagus), stomach, or the first part of the small intestine (duodenum). If you have upper GI bleeding, you may vomit blood or have bloody or black stools. Bleeding can range from mild to serious or even life-threatening. If there is a lot of bleeding, you may need to stay in the hospital. What are the causes? This condition may be caused by:  Ulcer disease of the stomach (peptic ulcer) or duodenum. This is the most common cause of GI bleeding.  Inflammation, irritation, or swelling of the esophagus (esophagitis).  A tear in the esophagus.  Cancer of the esophagus, stomach, or duodenum.  An abnormal or weakened blood vessel in one of the upper GI structures.  A bleeding disorder that impairs the formation of blood clots and causes easy bleeding (coagulopathy). What increases the risk? The following factors may make you more likely to develop this condition:  Being older than 83 years of age.  Being female.  Having another long-term disease, especially liver or kidney disease.  Having a stomach infection caused by Helicobacter pylori bacteria.  Having frequent or severe vomiting.  Abusing alcohol.  Taking certain medicines for a long time, such as: ? NSAIDs. ? Anticoagulants. What are the signs or symptoms? Symptoms of this condition include:  Vomiting blood.  Black or maroon-colored stools.  Bloody stools.  Weakness or dizziness.  Heartburn.  Abdominal pain.  Difficulty swallowing.  Weight loss.  Yellow eyes or skin (jaundice).  Racing heartbeat. How is this diagnosed? This condition may be diagnosed based on:  Your symptoms and medical history.  A physical exam. During the exam, your health care provider will check for signs of blood loss, such as low blood pressure and a rapid pulse.  Tests, such as: ? Blood tests to measure your blood cell count and to check for other signs of blood  loss and clotting ability. ? Blood tests to check your liver and kidney function. ? A chest X-ray to look for a tear in the esophagus. ? Endoscopy. In this procedure, a flexible scope is put down your esophagus and into your stomach or duodenum to look for the source of bleeding. ? Angiogram. This may be done if the source of bleeding is not found during endoscopy. For an angiogram, X-rays are taken after a dye is injected into your bloodstream. ? Nasogastric tube insertion. This is a tube passed through your nose and down into your stomach. It may be connected to a source of gentle suction to see if any blood comes out. How is this treated? Treatment for this condition depends on the cause of the bleeding. Active bleeding is treated at the hospital. Treatment may include:  Getting fluids through an IV tube inserted into one of your veins.  Getting blood through an IV tube (blood transfusion).  Getting high doses of medicine through the IV to lower stomach acid. This may be done to treat ulcer disease.  Having endoscopy to treat an area of bleeding with high heat (coagulation), injections, or surgical clips.  Having a procedure that involves first doing an angiogram and then blocking blood flow to the bleeding site (embolization).  Stopping or changing some of your regular medicines for a certain amount of time.  Having other surgical procedures if initial treatments do not control bleeding. Follow these instructions at home:  Take over-the-counter and prescription medicines only as told by your health care provider. You may need to avoid NSAIDs or other medicines that increase bleeding.  Do not drink alcohol.  Drink enough fluid to keep your urine clear or pale yellow.  Follow instructions from your health care provider about eating or drinking restrictions.  Return to your normal activities as told by your health care provider. Ask your health care provider what activities are safe  for you.  Do not use any tobacco products, such as cigarettes, chewing tobacco, and e-cigarettes. If you need help quitting, ask your health care provider.  Keep all follow-up visits as told by your health care provider. This is important. Contact a health care provider if:  You have abdominal pain or heartburn.  You have unexplained weight loss.  You have trouble swallowing.  You have frequent vomiting.  You develop jaundice.  You feel weak or dizzy.  You need help to stop smoking or drinking alcohol. Get help right away if:  You have vomiting with blood.  You have blood in your stools.  You have severe cramps in your back or abdomen.  Your symptoms of upper GI bleeding come back after treatment. This information is not intended to replace advice given to you by your health care provider. Make sure you discuss any questions you have with your health care provider. Document Released: 10/07/2015 Document Revised: 05/04/2017 Document Reviewed: 10/07/2015 Elsevier Patient Education  2020 [employer].

## 2019-01-10 NOTE — Progress Notes (Signed)
Patient discharged back to independent living.  IV removed - WNL.  Reviewed AVS and medications with patient and husband.  Verbalize understanding - no questions at this time.  Instructed on s/s of bleeding and when to call MD.  Patient in NAD at this time, assisted off unit via WC by NT

## 2019-01-12 ENCOUNTER — Encounter (HOSPITAL_COMMUNITY): Payer: Self-pay | Admitting: Gastroenterology

## 2019-01-21 ENCOUNTER — Encounter: Payer: Self-pay | Admitting: Cardiology

## 2019-01-21 ENCOUNTER — Other Ambulatory Visit: Payer: Self-pay

## 2019-01-21 ENCOUNTER — Ambulatory Visit (INDEPENDENT_AMBULATORY_CARE_PROVIDER_SITE_OTHER): Payer: Medicare Other | Admitting: Cardiology

## 2019-01-21 VITALS — BP 120/72 | HR 79 | Ht 64.0 in | Wt 169.0 lb

## 2019-01-21 DIAGNOSIS — N183 Chronic kidney disease, stage 3 unspecified: Secondary | ICD-10-CM

## 2019-01-21 DIAGNOSIS — I480 Paroxysmal atrial fibrillation: Secondary | ICD-10-CM | POA: Diagnosis not present

## 2019-01-21 DIAGNOSIS — I10 Essential (primary) hypertension: Secondary | ICD-10-CM

## 2019-01-21 NOTE — Patient Instructions (Signed)
Medication Instructions:  The current medical regimen is effective;  continue present plan and medications.  If you need a refill on your cardiac medications before your next appointment, please call your pharmacy.   Follow-Up: At [facility], you and your health needs are our priority.  As part of our continuing mission to provide you with exceptional heart care, we have created designated Provider Care Teams.  These Care Teams include your primary Cardiologist (physician) and Advanced Practice Providers (APPs -  Physician Assistants and Nurse Practitioners) who all work together to provide you with the care you need, when you need it. You will need a follow up appointment in 6 months.  Please call our office 2 months in advance to schedule this appointment.  You may see Mark Skains, MD or one of the following Advanced Practice Providers on your designated Care Team:   Lori Gerhardt, NP Laura Ingold, NP . Jill McDaniel, NP  Thank you for choosing [facility]!!       

## 2019-01-21 NOTE — Progress Notes (Signed)
Cardiology Office Note:    Date:  01/21/2019   ID:  Cassandra Ray, DOB Dec 09, 1922, MRN [id number]  PCP:  Lawerance Cruel, MD  Cardiologist:  Candee Furbish, MD  Electrophysiologist:  None   Referring MD: Lawerance Cruel, MD   Follow-up GI bleed hospitalization  History of Present Illness:    Cassandra Ray is a 85 y.o. female with GI bleed discharged on 01/10/2019 from the hospital admitted with melena.  She had been taking Eliquis for atrial fibrillation.  Went to the emergency room.  Felt generalized weakness rapid atrial fibrillation but converted to sinus rhythm when treated with diltiazem and amiodarone and then was changed to her home metoprolol.  Troponin was mildly elevated and was likely secondary to demand ischemia rather than acute coronary syndrome.  No MI.  COVID negative.  Echocardiogram on 12/13/2018 showed EF of 65%  CHADSVASc-4 on Eliquis and amiodarone baseline creatinine 2.5.  She was given Protonix, Dr. Stefani Dama in the hospital.  EGD was also done by Dr. Benson Norway who recommended that she hold off of her Eliquis (Cameron's lesions that were friable) and he talked to me on the phone about her condition and was concerned that with reinitiation of anticoagulation that she would rebleed.  Overall she is feeling quite well.  No further melena no fevers chills nausea vomiting syncope.  Past Medical History:  Diagnosis Date  . Actinic keratoses   . Arthritis    back   . Breast cancer ([place])    Breast cancer (right)  . Chronic diastolic CHF (congestive heart failure) ([place])   . CKD (chronic kidney disease), stage III ([place])   . Compression fracture of L3 lumbar vertebra   . Dysphagia   . GERD (gastroesophageal reflux disease)    takes otc Pepcid as needed  . Hiatal hernia 11/27/2014  . HTN (hypertension)   . Hypothyroidism   . Insomnia   . Osteopenia   . PAF (paroxysmal atrial fibrillation) ([place])    a. questionable episode in 2015 in [facility]. b. event monitor through  December/January 2014/2015 which showed no evidence of atrial fibrillation. c. Dx 12/2018.  Marland Kitchen PAT (paroxysmal atrial tachycardia) ([place])   . Pleural effusion     Past Surgical History:  Procedure Laterality Date  . BREAST LUMPECTOMY Right 1995   lumpectomy  . COLONOSCOPY    . ESOPHAGOGASTRODUODENOSCOPY (EGD) WITH PROPOFOL Left 01/09/2019   Procedure: ESOPHAGOGASTRODUODENOSCOPY (EGD) WITH PROPOFOL;  Surgeon: Carol Ada, MD;  Location: [facility];  Service: Endoscopy;  Laterality: Left;  . EYE SURGERY Bilateral    cataract w/ lens implant  . KYPHOPLASTY N/A 09/18/2013   Procedure: KYPHOPLASTY;  Surgeon: Sinclair Ship, MD;  Location: [facility];  Service: Orthopedics;  Laterality: N/A;  Lumbar 3 kyphoplasty  . TONSILLECTOMY      Current Medications: Current Meds  Medication Sig  . amiodarone (PACERONE) 200 MG tablet Take 1 tablet (200 mg total) by mouth daily.  . B Complex-C (B-COMPLEX WITH VITAMIN C) tablet Take 1 tablet by mouth daily.  . Calcium-Phosphorus-Vitamin D (CITRACAL +D3 PO) Take 1 tablet by mouth daily.  . Cholecalciferol (VITAMIN D3) 2000 units TABS Take 2,000 Units by mouth daily.   . furosemide (LASIX) 40 MG tablet Take 1 tablet (40 mg total) by mouth daily.  . Glucosamine HCl 1500 MG TABS Take 3,000 mg by mouth at bedtime.   . levalbuterol (XOPENEX HFA) 45 MCG/ACT inhaler Inhale 2 puffs into the lungs every 4 (four) hours as needed for  wheezing.  Marland Kitchen levothyroxine (SYNTHROID) 75 MCG tablet Take 1 tablet (75 mcg total) by mouth daily before breakfast.  . lipase/protease/amylase (CREON) 36000 UNITS CPEP capsule Take 72,000 Units by mouth 3 (three) times daily.   Marland Kitchen loperamide (IMODIUM A-D) 2 MG tablet Take 2 mg by mouth daily as needed for diarrhea or loose stools.   . metoprolol tartrate (LOPRESSOR) 25 MG tablet Take 0.5 tablets (12.5 mg total) by mouth 2 (two) times daily.  . nitroGLYCERIN (NITROSTAT) 0.4 MG SL tablet Place 0.4 mg under the tongue every 5 (five) minutes  as needed for chest pain.  . pantoprazole (PROTONIX) 40 MG tablet Take 1 tablet (40 mg total) by mouth daily.  . Probiotic Product (ALIGN) 4 MG CAPS Take 4 mg by mouth daily.     Allergies:   Tape   Social History   Socioeconomic History  . Marital status: Married    Spouse name: Not on file  . Number of children: Not on file  . Years of education: Not on file  . Highest education level: Not on file  Occupational History  . Not on file  Social Needs  . Financial resource strain: Not on file  . Food insecurity    Worry: Not on file    Inability: Not on file  . Transportation needs    Medical: Not on file    Non-medical: Not on file  Tobacco Use  . Smoking status: Former Research scientist (life sciences)  . Smokeless tobacco: Never Used  . Tobacco comment: only in college  Substance and Sexual Activity  . Alcohol use: Yes    Alcohol/week: 7.0 standard drinks    Types: 7 Glasses of wine per week    Comment: 1 glass of wine a day  . Drug use: No  . Sexual activity: Not on file  Lifestyle  . Physical activity    Days per week: Not on file    Minutes per session: Not on file  . Stress: Not on file  Relationships  . Social Herbalist on phone: Not on file    Gets together: Not on file    Attends religious service: Not on file    Active member of club or organization: Not on file    Attends meetings of clubs or organizations: Not on file    Relationship status: Not on file  Other Topics Concern  . Not on file  Social History Narrative  . Not on file     Family History: The patient's family history includes CAD in her mother; Diabetes Mellitus II in her mother; Heart disease in her sister; Heart failure in her father and mother; Kidney cancer in her brother; Prostate cancer in her brother. There is no history of Esophageal cancer or Colon cancer.  ROS:   Please see the history of present illness.     All other systems reviewed and are negative.  EKGs/Labs/Other Studies Reviewed:     The following studies were reviewed today: Prior echocardiogram reviewed.  Troponin was normal.  Creatinine 1.63.  EKG: On 01/09/2019-atrial tachycardia was noted.  This converted back to sinus rhythm.  Recent Labs: 01/09/2019: ALT 12; B Natriuretic Peptide 113.6; Magnesium 1.9; TSH 0.032 01/10/2019: BUN 32; Creatinine, Ser 1.63; Hemoglobin 9.2; Platelets 143; Potassium 4.0; Sodium 143  Recent Lipid Panel    Component Value Date/Time   CHOL 128 12/13/2018 0500   TRIG 63 12/13/2018 0500   HDL 79 12/13/2018 0500   CHOLHDL 1.6 12/13/2018 0500  VLDL 13 12/13/2018 0500   LDLCALC 36 12/13/2018 0500    Physical Exam:    VS:  BP 120/72   Pulse 79   Ht 5\' 4"  (1.626 m)   Wt 169 lb (76.7 kg)   LMP  (LMP Unknown)   SpO2 97%   BMI 29.01 kg/m     Wt Readings from Last 3 Encounters:  01/21/19 169 lb (76.7 kg)  01/08/19 169 lb 5 oz (76.8 kg)  12/25/18 169 lb 12.8 oz (77 kg)     GEN:  Well nourished, well developed in no acute distress HEENT: Normal NECK: No JVD; No carotid bruits LYMPHATICS: No lymphadenopathy CARDIAC: RRR, no murmurs, rubs, gallops RESPIRATORY:  Clear to auscultation without rales, wheezing or rhonchi  ABDOMEN: Soft, non-tender, non-distended MUSCULOSKELETAL:  No edema; No deformity  SKIN: Warm and dry NEUROLOGIC:  Alert and oriented x 3 PSYCHIATRIC:  Normal affect   ASSESSMENT:    No diagnosis found. PLAN:    In order of problems listed above:  Paroxysmal atrial fibrillation with GI bleed 01/09/2019 -Continue amiodarone for rate control 200 mg a day. - No longer on Eliquis because of gastrointestinal bleeding.  She is at high risk for rebleeding if we restart. Dr. Benson Norway.   Chronic kidney disease stage III -Try to avoid NSAIDs.  We discussed again.  Hypothyroidism - Synthroid decreased during a prior hospitalization.    Medication Adjustments/Labs and Tests Ordered: Current medicines are reviewed at length with the patient today.  Concerns regarding  medicines are outlined above.  No orders of the defined types were placed in this encounter.  No orders of the defined types were placed in this encounter.   Patient Instructions  Medication Instructions:  The current medical regimen is effective;  continue present plan and medications.  If you need a refill on your cardiac medications before your next appointment, please call your pharmacy.   Follow-Up: At [facility], you and your health needs are our priority.  As part of our continuing mission to provide you with exceptional heart care, we have created designated Provider Care Teams.  These Care Teams include your primary Cardiologist (physician) and Advanced Practice Providers (APPs -  Physician Assistants and Nurse Practitioners) who all work together to provide you with the care you need, when you need it. You will need a follow up appointment in 6 months.  Please call our office 2 months in advance to schedule this appointment.  You may see Candee Furbish, MD or one of the following Advanced Practice Providers on your designated Care Team:   Truitt Merle, NP Cecilie Kicks, NP . Kathyrn Drown, NP  Thank you for choosing [facility]!!        Signed, Candee Furbish, MD  01/21/2019 12:34 PM    [place]

## 2019-03-28 ENCOUNTER — Ambulatory Visit: Payer: Medicare Other | Admitting: Cardiology

## 2019-07-23 ENCOUNTER — Other Ambulatory Visit: Payer: Self-pay

## 2019-07-23 ENCOUNTER — Encounter: Payer: Self-pay | Admitting: Cardiology

## 2019-07-23 ENCOUNTER — Ambulatory Visit: Payer: Medicare Other | Admitting: Cardiology

## 2019-07-23 VITALS — BP 120/60 | HR 67 | Ht 64.0 in | Wt 166.0 lb

## 2019-07-23 DIAGNOSIS — I480 Paroxysmal atrial fibrillation: Secondary | ICD-10-CM

## 2019-07-23 DIAGNOSIS — N183 Chronic kidney disease, stage 3 unspecified: Secondary | ICD-10-CM

## 2019-07-23 DIAGNOSIS — I10 Essential (primary) hypertension: Secondary | ICD-10-CM

## 2019-07-23 MED ORDER — FUROSEMIDE 40 MG PO TABS: 40.0000 mg | ORAL_TABLET | Freq: Every day | ORAL | 1 refills | Status: DC

## 2019-07-23 NOTE — Patient Instructions (Signed)
Medication Instructions:  Please take Furosemide 40 mg once a day. Continue all other medications as listed.  *If you need a refill on your cardiac medications before your next appointment, please call your pharmacy*  Follow-Up: At [facility], you and your health needs are our priority.  As part of our continuing mission to provide you with exceptional heart care, we have created designated Provider Care Teams.  These Care Teams include your primary Cardiologist (physician) and Advanced Practice Providers (APPs -  Physician Assistants and Nurse Practitioners) who all work together to provide you with the care you need, when you need it.  Your next appointment:   12 month(s)  The format for your next appointment:   In Person  Provider:   Candee Furbish, MD  Thank you for choosing [facility]!!

## 2019-07-23 NOTE — Progress Notes (Signed)
Cardiology Office Note:    Date:  07/23/2019   ID:  Cassandra Ray, DOB 1923-02-15, MRN [id number]  PCP:  Lawerance Cruel, MD  Cardiologist:  Candee Furbish, MD  Electrophysiologist:  None   Referring MD: Lawerance Cruel, MD     History of Present Illness:    Cassandra Ray is a 86 y.o. female here for follow-up of atrial fibrillation.  We are holding off of anticoagulation because of Cameron's lesions that were friable noted on EGD.  Dr. Benson Norway performed EGD.  He was concerned about reinitiation of anticoagulation.  We had lengthy discussion.  Echocardiogram in July 2020 showed normal EF.  At one point felt generalized weakness with rapid A. fib but converted to sinus rhythm when treated with diltiazem and amiodarone.  Eventually this was changed to home metoprolol.  Had demand ischemia in the setting of this A. Fib.  Overall she has been doing quite well.  Her husband did give me a copy of Dr. Alan Ripper recent lab work, creatinine is up to 1.9.  I inquired about her Lasix usage and she states that she is actually taking 3 of the 40 mg pills a day.  I have asked her to decrease this time.  See below.  Denies any chest pain.  No bleeding.  She states that she did not like taking the iron supplement.  Her husband was on the history board for several years.  Past Medical History:  Diagnosis Date  . Actinic keratoses   . Arthritis    back   . Breast cancer ([place])    Breast cancer (right)  . Chronic diastolic CHF (congestive heart failure) ([place])   . CKD (chronic kidney disease), stage III   . Compression fracture of L3 lumbar vertebra   . Dysphagia   . GERD (gastroesophageal reflux disease)    takes otc Pepcid as needed  . Hiatal hernia 11/27/2014  . HTN (hypertension)   . Hypothyroidism   . Insomnia   . Osteopenia   . PAF (paroxysmal atrial fibrillation) ([place])    a. questionable episode in 2015 in [facility]. b. event monitor through December/January 2014/2015 which showed no  evidence of atrial fibrillation. c. Dx 12/2018.  Marland Kitchen PAT (paroxysmal atrial tachycardia) ([place])   . Pleural effusion     Past Surgical History:  Procedure Laterality Date  . BREAST LUMPECTOMY Right 1995   lumpectomy  . COLONOSCOPY    . ESOPHAGOGASTRODUODENOSCOPY (EGD) WITH PROPOFOL Left 01/09/2019   Procedure: ESOPHAGOGASTRODUODENOSCOPY (EGD) WITH PROPOFOL;  Surgeon: Carol Ada, MD;  [facility];  Service: Endoscopy;  Laterality: Left;  . EYE SURGERY Bilateral    cataract w/ lens implant  . KYPHOPLASTY N/A 09/18/2013   Procedure: KYPHOPLASTY;  Surgeon: Sinclair Ship, MD;  Location: [place];  Service: Orthopedics;  Laterality: N/A;  Lumbar 3 kyphoplasty  . TONSILLECTOMY      Current Medications: Current Meds  Medication Sig  . amiodarone (PACERONE) 200 MG tablet Take 1 tablet (200 mg total) by mouth daily.  . B Complex-C (B-COMPLEX WITH VITAMIN C) tablet Take 1 tablet by mouth daily.  . Calcium-Phosphorus-Vitamin D (CITRACAL +D3 PO) Take 1 tablet by mouth daily.  . Cholecalciferol (VITAMIN D3) 2000 units TABS Take 2,000 Units by mouth daily.   . furosemide (LASIX) 40 MG tablet Take 1 tablet (40 mg total) by mouth daily.  . Glucosamine HCl 1500 MG TABS Take 3,000 mg by mouth at bedtime.   . levalbuterol (XOPENEX HFA) 45 MCG/ACT  inhaler Inhale 2 puffs into the lungs every 4 (four) hours as needed for wheezing.  Marland Kitchen levothyroxine (SYNTHROID) 75 MCG tablet Take 1 tablet (75 mcg total) by mouth daily before breakfast.  . lipase/protease/amylase (CREON) 36000 UNITS CPEP capsule Take 72,000 Units by mouth 3 (three) times daily.   Marland Kitchen loperamide (IMODIUM A-D) 2 MG tablet Take 2 mg by mouth daily as needed for diarrhea or loose stools.   . metoprolol tartrate (LOPRESSOR) 25 MG tablet Take 0.5 tablets (12.5 mg total) by mouth 2 (two) times daily.  . nitroGLYCERIN (NITROSTAT) 0.4 MG SL tablet Place 0.4 mg under the tongue every 5 (five) minutes as needed for chest pain.  . pantoprazole  (PROTONIX) 40 MG tablet Take 1 tablet (40 mg total) by mouth daily.  . Probiotic Product (ALIGN) 4 MG CAPS Take 4 mg by mouth daily.  . [DISCONTINUED] furosemide (LASIX) 40 MG tablet Take 1 tablet (40 mg total) by mouth daily.     Allergies:   Tape   Social History   Socioeconomic History  . Marital status: Married    Spouse name: Not on file  . Number of children: Not on file  . Years of education: Not on file  . Highest education level: Not on file  Occupational History  . Not on file  Tobacco Use  . Smoking status: Former Research scientist (life sciences)  . Smokeless tobacco: Never Used  . Tobacco comment: only in college  Substance and Sexual Activity  . Alcohol use: Yes    Alcohol/week: 7.0 standard drinks    Types: 7 Glasses of wine per week    Comment: 1 glass of wine a day  . Drug use: No  . Sexual activity: Not on file  Other Topics Concern  . Not on file  Social History Narrative  . Not on file   Social Determinants of Health   Financial Resource Strain:   . Difficulty of Paying Living Expenses: Not on file  Food Insecurity:   . Worried About Charity fundraiser in the Last Year: Not on file  . Ran Out of Food in the Last Year: Not on file  Transportation Needs:   . Lack of Transportation (Medical): Not on file  . Lack of Transportation (Non-Medical): Not on file  Physical Activity:   . Days of Exercise per Week: Not on file  . Minutes of Exercise per Session: Not on file  Stress:   . Feeling of Stress : Not on file  Social Connections:   . Frequency of Communication with Friends and Family: Not on file  . Frequency of Social Gatherings with Friends and Family: Not on file  . Attends Religious Services: Not on file  . Active Member of Clubs or Organizations: Not on file  . Attends Archivist Meetings: Not on file  . Marital Status: Not on file     Family History: The patient's family history includes CAD in her mother; Diabetes Mellitus II in her mother; Heart  disease in her sister; Heart failure in her father and mother; Kidney cancer in her brother; Prostate cancer in her brother. There is no history of Esophageal cancer or Colon cancer.  ROS:   Please see the history of present illness.    No fevers chills nausea vomiting syncope bleeding all other systems reviewed and are negative.  EKGs/Labs/Other Studies Reviewed:    The following studies were reviewed today:  Echo 12/13/2018: 1. The left ventricle has normal systolic function with an ejection  fraction of 60-65%. The cavity size was normal. There is mildly increased  left ventricular wall thickness. Left ventricular diastolic Doppler  parameters are indeterminate.  2. Trivial pericardial effusion is present.  3. The mitral valve is abnormal. Mild thickening of the mitral valve  leaflet. There is mild mitral annular calcification present.  4. The tricuspid valve is grossly normal.  5. The aortic valve is tricuspid.   EKG:   On 01/09/2019-atrial tachycardia was noted.  Converted to sinus rhythm.  Recent Labs: 01/09/2019: ALT 12; B Natriuretic Peptide 113.6; Magnesium 1.9; TSH 0.032 01/10/2019: BUN 32; Creatinine, Ser 1.63; Hemoglobin 9.2; Platelets 143; Potassium 4.0; Sodium 143  Recent Lipid Panel    Component Value Date/Time   CHOL 128 12/13/2018 0500   TRIG 63 12/13/2018 0500   HDL 79 12/13/2018 0500   CHOLHDL 1.6 12/13/2018 0500   VLDL 13 12/13/2018 0500   LDLCALC 36 12/13/2018 0500    Physical Exam:    VS:  BP 120/60   Pulse 67   Ht 5\' 4"  (1.626 m)   Wt 166 lb (75.3 kg)   LMP  (LMP Unknown)   SpO2 99%   BMI 28.49 kg/m     Wt Readings from Last 3 Encounters:  07/23/19 166 lb (75.3 kg)  01/21/19 169 lb (76.7 kg)  01/08/19 169 lb 5 oz (76.8 kg)     GEN: Elderly, uses walker well nourished, well developed in no acute distress HEENT: Normal NECK: No JVD; No carotid bruits LYMPHATICS: No lymphadenopathy CARDIAC: RRR, no murmurs, rubs, gallops RESPIRATORY:  Clear  to auscultation without rales, wheezing or rhonchi  ABDOMEN: Soft, non-tender, non-distended MUSCULOSKELETAL:  No edema; No deformity  SKIN: Warm and dry NEUROLOGIC:  Alert and oriented x 3 PSYCHIATRIC:  Normal affect   ASSESSMENT:    1. PAF (paroxysmal atrial fibrillation) (HCC)   2. Stage 3 chronic kidney disease, unspecified whether stage 3a or 3b CKD   3. Essential hypertension    PLAN:    In order of problems listed above:  Paroxysmal atrial fibrillation -Continue with amiodarone for rate control 200 mg a day.  GI bleed -Felt to be too high risk for reinitiation of anticoagulation after lengthy discussion with GI.  Chronic kidney disease stage III -Knows to avoid NSAIDs. -Her most recent creatinine was 1.92.,  Hemoglobin 12.1, TSH 1.7.  we are going to decrease her Lasix dose to 40 mg once a day.  She states that she was taking 2 in the morning and 1 in the evening, 3 a day at home.  They are also waiting to be seen by [facility], could be several months they state.  Continue to elevate lower extremities, avoid excessive fluids.  Avoid salt.  Hypothyroidism -Synthroid has been decreased during her prior hospitalization.  Dr. Harrington Challenger is watching closely.   Medication Adjustments/Labs and Tests Ordered: Current medicines are reviewed at length with the patient today.  Concerns regarding medicines are outlined above.  No orders of the defined types were placed in this encounter.  Meds ordered this encounter  Medications  . furosemide (LASIX) 40 MG tablet    Sig: Take 1 tablet (40 mg total) by mouth daily.    Dispense:  90 tablet    Refill:  1    Patient Instructions  Medication Instructions:  Please take Furosemide 40 mg once a day. Continue all other medications as listed.  *If you need a refill on your cardiac medications before your next appointment, please call your pharmacy*  Follow-Up: At [facility], you and your health needs are our priority.  As part  of our continuing mission to provide you with exceptional heart care, we have created designated Provider Care Teams.  These Care Teams include your primary Cardiologist (physician) and Advanced Practice Providers (APPs -  Physician Assistants and Nurse Practitioners) who all work together to provide you with the care you need, when you need it.  Your next appointment:   12 month(s)  The format for your next appointment:   In Person  Provider:   Candee Furbish, MD  Thank you for choosing [facility]!!        Signed, Candee Furbish, MD  07/23/2019 1:56 PM    [place]

## 2020-01-01 ENCOUNTER — Other Ambulatory Visit: Payer: Self-pay | Admitting: Physician Assistant

## 2020-01-11 ENCOUNTER — Other Ambulatory Visit: Payer: Self-pay | Admitting: Physician Assistant

## 2020-01-11 IMAGING — CR DG SACRUM/COCCYX 2+V
3 series · 3 of 3 positions shown · non-contrast
Comparison: None

CLINICAL DATA: Fall, initial encounter

EXAM:
SACRUM AND COCCYX - 2+ VIEW

[t sacrum ap]
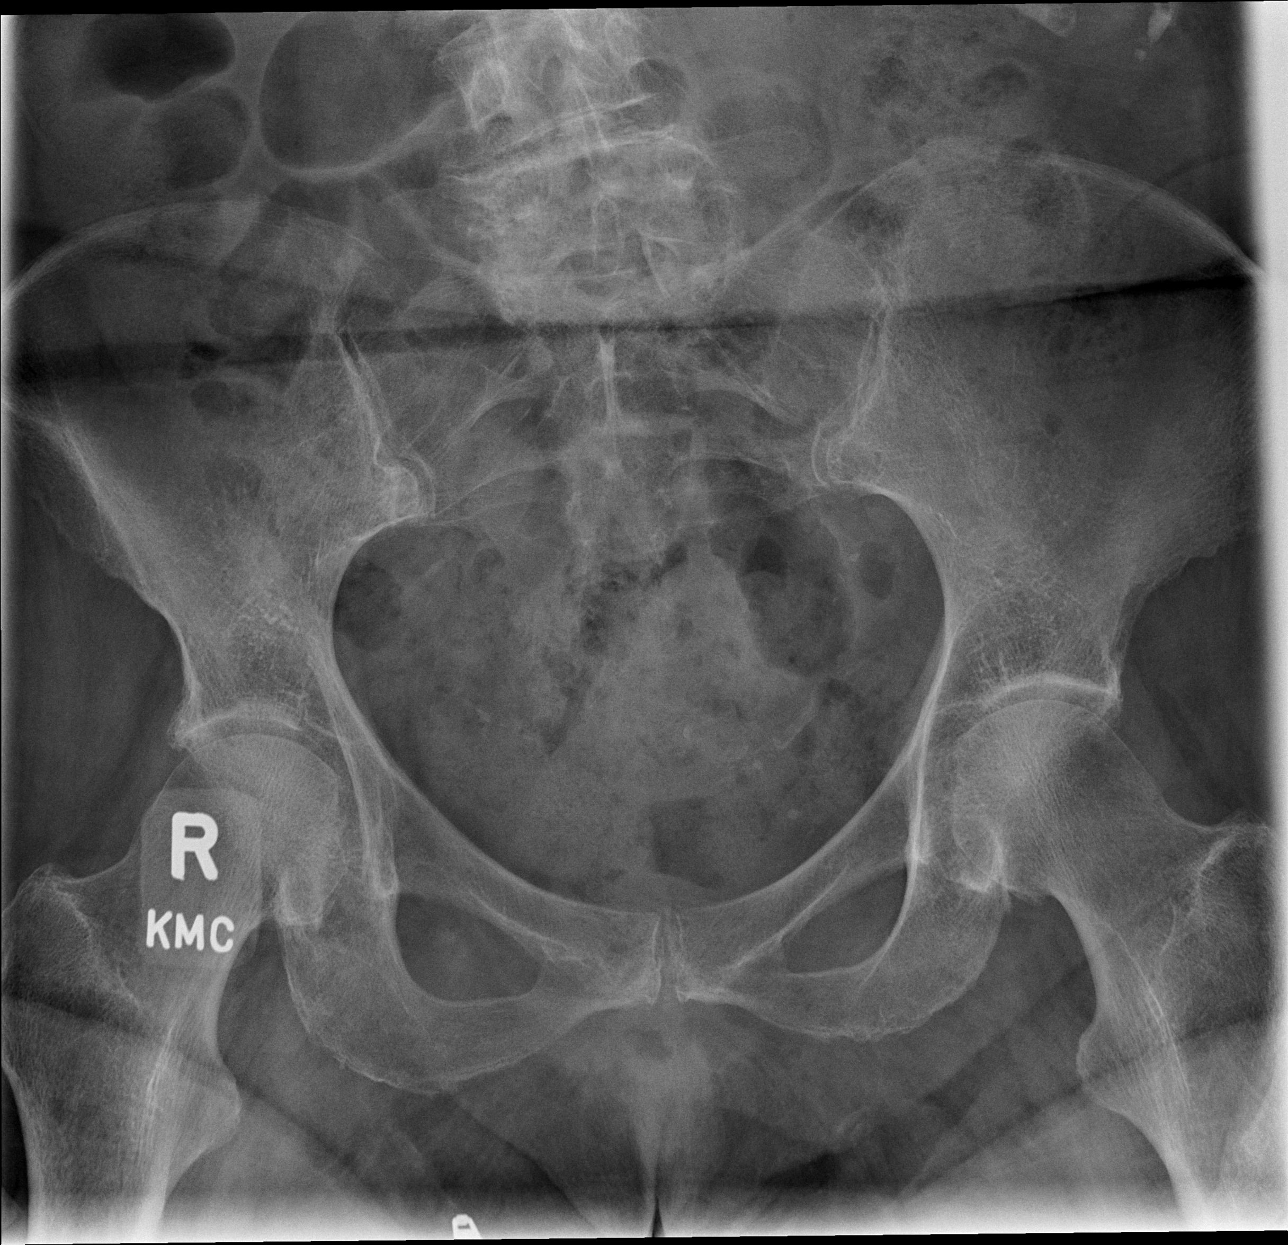

[t coccyx ap]
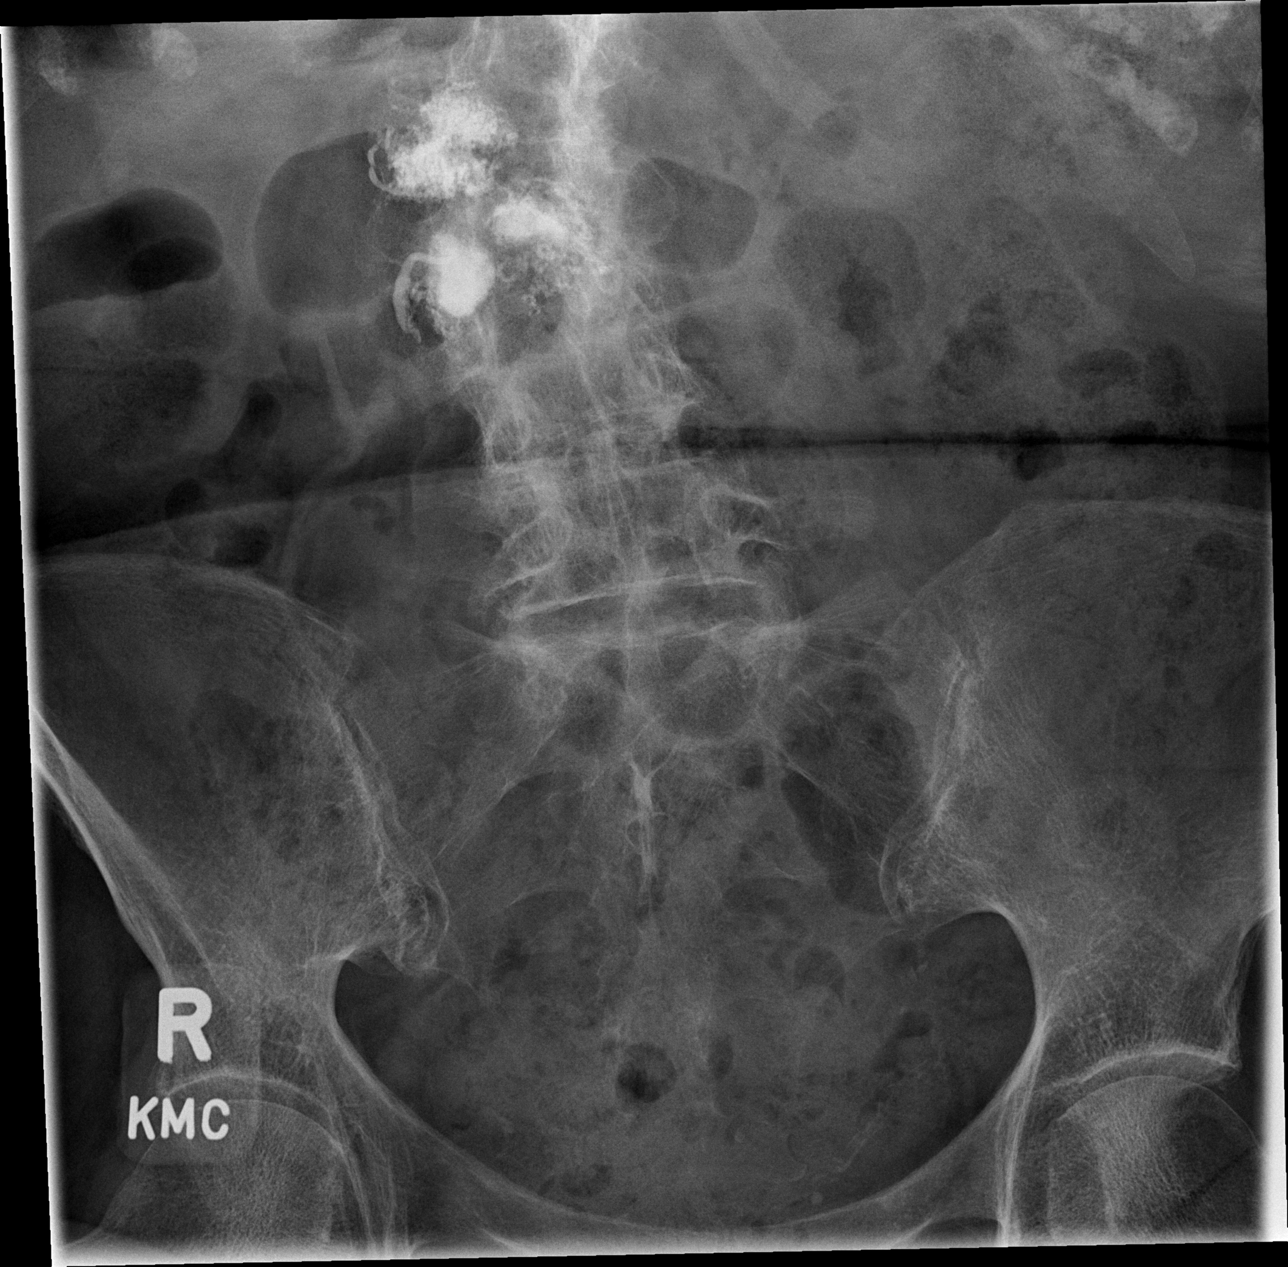

[t sacrum coccyx lat]
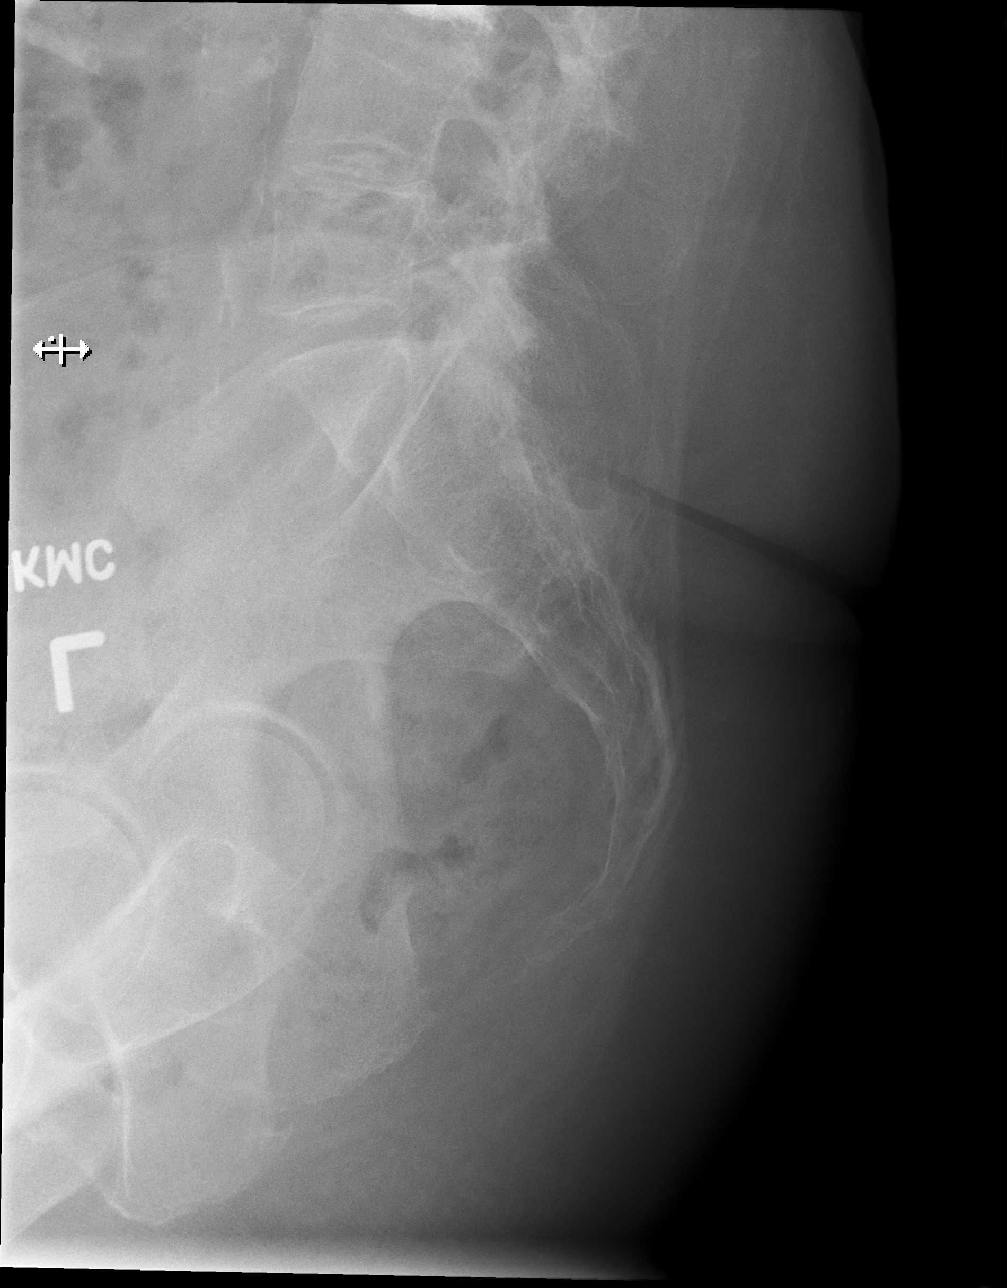

[3 of 3 positions shown; findings below may reference images not displayed]

FINDINGS: Minimal narrowing of the hip joints.

SI joints preserved.

Degenerative disc and facet disease changes at visualized lower
lumbar spine.

No definite sacrococcygeal fracture identified.
IMPRESSION: No definite sacrococcygeal fracture identified.

## 2020-02-01 ENCOUNTER — Other Ambulatory Visit: Payer: Self-pay | Admitting: Physician Assistant

## 2020-04-16 ENCOUNTER — Other Ambulatory Visit: Payer: Self-pay | Admitting: Cardiology

## 2020-06-30 DIAGNOSIS — I509 Heart failure, unspecified: Secondary | ICD-10-CM | POA: Diagnosis not present

## 2020-06-30 DIAGNOSIS — E039 Hypothyroidism, unspecified: Secondary | ICD-10-CM | POA: Diagnosis not present

## 2020-06-30 DIAGNOSIS — J45998 Other asthma: Secondary | ICD-10-CM | POA: Diagnosis not present

## 2020-06-30 DIAGNOSIS — Z0000 Encounter for general adult medical examination without abnormal findings: Secondary | ICD-10-CM | POA: Diagnosis not present

## 2020-06-30 DIAGNOSIS — K219 Gastro-esophageal reflux disease without esophagitis: Secondary | ICD-10-CM | POA: Diagnosis not present

## 2020-06-30 DIAGNOSIS — J3489 Other specified disorders of nose and nasal sinuses: Secondary | ICD-10-CM | POA: Diagnosis not present

## 2020-06-30 DIAGNOSIS — I4891 Unspecified atrial fibrillation: Secondary | ICD-10-CM | POA: Diagnosis not present

## 2020-06-30 DIAGNOSIS — M8580 Other specified disorders of bone density and structure, unspecified site: Secondary | ICD-10-CM | POA: Diagnosis not present

## 2020-06-30 DIAGNOSIS — I10 Essential (primary) hypertension: Secondary | ICD-10-CM | POA: Diagnosis not present

## 2020-06-30 DIAGNOSIS — N184 Chronic kidney disease, stage 4 (severe): Secondary | ICD-10-CM | POA: Diagnosis not present

## 2020-07-07 DIAGNOSIS — D485 Neoplasm of uncertain behavior of skin: Secondary | ICD-10-CM | POA: Diagnosis not present

## 2020-07-07 DIAGNOSIS — C44729 Squamous cell carcinoma of skin of left lower limb, including hip: Secondary | ICD-10-CM | POA: Diagnosis not present

## 2020-07-07 DIAGNOSIS — L720 Epidermal cyst: Secondary | ICD-10-CM | POA: Diagnosis not present

## 2020-07-07 DIAGNOSIS — L820 Inflamed seborrheic keratosis: Secondary | ICD-10-CM | POA: Diagnosis not present

## 2020-07-14 ENCOUNTER — Other Ambulatory Visit: Payer: Self-pay | Admitting: Cardiology

## 2020-08-15 ENCOUNTER — Other Ambulatory Visit: Payer: Self-pay | Admitting: Cardiology

## 2020-08-17 DIAGNOSIS — I4891 Unspecified atrial fibrillation: Secondary | ICD-10-CM | POA: Diagnosis not present

## 2020-08-17 DIAGNOSIS — K219 Gastro-esophageal reflux disease without esophagitis: Secondary | ICD-10-CM | POA: Diagnosis not present

## 2020-08-17 DIAGNOSIS — I10 Essential (primary) hypertension: Secondary | ICD-10-CM | POA: Diagnosis not present

## 2020-08-17 DIAGNOSIS — J45998 Other asthma: Secondary | ICD-10-CM | POA: Diagnosis not present

## 2020-08-17 DIAGNOSIS — I509 Heart failure, unspecified: Secondary | ICD-10-CM | POA: Diagnosis not present

## 2020-08-17 DIAGNOSIS — E039 Hypothyroidism, unspecified: Secondary | ICD-10-CM | POA: Diagnosis not present

## 2020-08-17 DIAGNOSIS — N184 Chronic kidney disease, stage 4 (severe): Secondary | ICD-10-CM | POA: Diagnosis not present

## 2020-08-17 DIAGNOSIS — M8580 Other specified disorders of bone density and structure, unspecified site: Secondary | ICD-10-CM | POA: Diagnosis not present

## 2020-08-30 ENCOUNTER — Other Ambulatory Visit: Payer: Self-pay | Admitting: Cardiology

## 2020-08-30 DIAGNOSIS — H52203 Unspecified astigmatism, bilateral: Secondary | ICD-10-CM | POA: Diagnosis not present

## 2020-08-30 DIAGNOSIS — Z961 Presence of intraocular lens: Secondary | ICD-10-CM | POA: Diagnosis not present

## 2020-09-14 NOTE — Progress Notes (Signed)
Cardiology Office Note    Date:  09/21/2020   ID:  Cassandra Ray, Cassandra Ray [birth year], MRN [id number]   PCP:  Lawerance Cruel, [place]  Cardiologist:  Candee Furbish, MD  Advanced Practice Provider:  No care team member to display Electrophysiologist:  None   [id number]   Chief Complaint  Patient presents with  . Follow-up    History of Present Illness:  Cassandra Ray is a 87 y.o. female with history of atrial fibrillation on amiodarone for rate control, anticoagulation stopped because of GI bleeding could not be reinstated.  CKD stage III, hypothyroidism.  Patient saw Dr. [name] 07/23/2019 at which time she was taken 3 of the 40 mg pills of Lasix daily and he asked her to decrease this because of rising creatinine of 1.9.  Patient comes in for f/u accompanied by her husband. Denies chest pain, dyspnea. Occasional edema-wears compression hose. Uses a walker and does 15 min every afternoon. TSH over 10 in January and synthroid adjusted. In the evening she has complete exhaustion and can hardly make it to bed. Nurse came and all her vitals were ok.  Had a busy day that day. Has had a couple of episodes. Husband is worried about these episodes.  Past Medical History:  Diagnosis Date  . Actinic keratoses   . Arthritis    back   . Breast cancer ([place])    Breast cancer (right)  . Chronic diastolic CHF (congestive heart failure) ([place])   . CKD (chronic kidney disease), stage III ([place])   . Compression fracture of L3 lumbar vertebra   . Dysphagia   . GERD (gastroesophageal reflux disease)    takes otc Pepcid as needed  . Hiatal hernia 11/27/2014  . HTN (hypertension)   . Hypothyroidism   . Insomnia   . Osteopenia   . PAF (paroxysmal atrial fibrillation) ([place])    a. questionable episode in 2015 in [facility]. b. event monitor through December/January 2014/2015 which showed no evidence of atrial fibrillation. c. Dx 12/2018.  Marland Kitchen PAT (paroxysmal atrial  tachycardia) ([place])   . Pleural effusion     Past Surgical History:  Procedure Laterality Date  . BREAST LUMPECTOMY Right 1995   lumpectomy  . COLONOSCOPY    . ESOPHAGOGASTRODUODENOSCOPY (EGD) WITH PROPOFOL Left 01/09/2019   Procedure: ESOPHAGOGASTRODUODENOSCOPY (EGD) WITH PROPOFOL;  Surgeon: Carol Ada, MD;  [facility];  Service: Endoscopy;  Laterality: Left;  . EYE SURGERY Bilateral    cataract w/ lens implant  . KYPHOPLASTY N/A 09/18/2013   Procedure: KYPHOPLASTY;  Surgeon: Sinclair Ship, MD;  Location: [place];  Service: Orthopedics;  Laterality: N/A;  Lumbar 3 kyphoplasty  . TONSILLECTOMY      Current Medications: Current Meds  Medication Sig  . amiodarone (PACERONE) 200 MG tablet Take 1 tablet (200 mg total) by mouth daily.  . B Complex-C (B-COMPLEX WITH VITAMIN C) tablet Take 1 tablet by mouth daily.  . Calcium-Phosphorus-Vitamin D (CITRACAL +D3 PO) Take 1 tablet by mouth daily.  . Cholecalciferol (VITAMIN D3) 2000 units TABS Take 2,000 Units by mouth daily.   . furosemide (LASIX) 40 MG tablet TAKE 1 TABLET ONCE DAILY.  Marland Kitchen Glucosamine HCl 1500 MG TABS Take 3,000 mg by mouth at bedtime.   Marland Kitchen levothyroxine (SYNTHROID) 75 MCG tablet Take 1 tablet (75 mcg total) by mouth daily before breakfast.  . lipase/protease/amylase (CREON) 36000 UNITS CPEP capsule Take 72,000 Units by mouth 3 (three) times daily.   Marland Kitchen  loperamide (IMODIUM A-D) 2 MG tablet Take 2 mg by mouth daily as needed for diarrhea or loose stools.   . metoprolol tartrate (LOPRESSOR) 25 MG tablet TAKE (1/2) TABLET TWICE DAILY.  . nitroGLYCERIN (NITROSTAT) 0.4 MG SL tablet Place 1 tablet (0.4 mg total) under the tongue every 5 (five) minutes as needed for chest pain.  . pantoprazole (PROTONIX) 40 MG tablet Take 1 tablet (40 mg total) by mouth daily.  . Probiotic Product (ALIGN) 4 MG CAPS Take 4 mg by mouth daily.     Allergies:   Tape   Social History   Socioeconomic History  . Marital status: Married     Spouse name: Not on file  . Number of children: Not on file  . Years of education: Not on file  . Highest education level: Not on file  Occupational History  . Not on file  Tobacco Use  . Smoking status: Former Research scientist (life sciences)  . Smokeless tobacco: Never Used  . Tobacco comment: only in college  Vaping Use  . Vaping Use: Never used  Substance and Sexual Activity  . Alcohol use: Yes    Alcohol/week: 7.0 standard drinks    Types: 7 Glasses of wine per week    Comment: 1 glass of wine a day  . Drug use: No  . Sexual activity: Not on file  Other Topics Concern  . Not on file  Social History Narrative  . Not on file   Social Determinants of Health   Financial Resource Strain: Not on file  Food Insecurity: Not on file  Transportation Needs: Not on file  Physical Activity: Not on file  Stress: Not on file  Social Connections: Not on file     Family History:  The patient's family history includes CAD in her mother; Diabetes Mellitus II in her mother; Heart disease in her sister; Heart failure in her father and mother; Kidney cancer in her brother; Prostate cancer in her brother.   ROS:   Please see the history of present illness.    ROS All other systems reviewed and are negative.   PHYSICAL EXAM:   VS:  BP 110/80   Pulse (!) 53   Ht 5\' 4"  (1.626 m)   Wt 152 lb (68.9 kg)   LMP  (LMP Unknown)   BMI 26.09 kg/m   Physical Exam  GEN: Well nourished, well developed, in no acute distress  Neck: no JVD, carotid bruits, or masses Cardiac:RRR; 1/6 systolic murmur the left sternal border Respiratory:  clear to auscultation bilaterally, normal work of breathing GI: soft, nontender, nondistended, + BS Ext: without cyanosis, clubbing, or edema, Good distal pulses bilaterally Neuro:  Alert and Oriented x 3 Psych: euthymic mood, full affect  Wt Readings from Last 3 Encounters:  09/21/20 152 lb (68.9 kg)  07/23/19 166 lb (75.3 kg)  01/21/19 169 lb (76.7 kg)      Studies/Labs  Reviewed:   EKG:  EKG is  ordered today.  The ekg ordered today demonstrates sinus bradycardia with first degree AV block poor R wave progression anteriorly.  Recent Labs: No results found for requested labs within last 8760 hours.   Lipid Panel    Component Value Date/Time   CHOL 128 12/13/2018 0500   TRIG 63 12/13/2018 0500   HDL 79 12/13/2018 0500   CHOLHDL 1.6 12/13/2018 0500   VLDL 13 12/13/2018 0500   LDLCALC 36 12/13/2018 0500    Additional studies/ records that were reviewed today include:  Echo 12/13/2018:  1. The left ventricle has normal systolic function with an ejection  fraction of 60-65%. The cavity size was normal. There is mildly increased  left ventricular wall thickness. Left ventricular diastolic Doppler  parameters are indeterminate.   2. Trivial pericardial effusion is present.   3. The mitral valve is abnormal. Mild thickening of the mitral valve  leaflet. There is mild mitral annular calcification present.   4. The tricuspid valve is grossly normal.   5. The aortic valve is tricuspid.     Risk Assessment/Calculations:    CHA2DS2-VASc Score = 5  This indicates a 7.2% annual risk of stroke. The patient's score is based upon: CHF History: Yes HTN History: Yes Diabetes History: No Stroke History: No Vascular Disease History: No Age Score: 2 Gender Score: 1        ASSESSMENT:    1. PAF (paroxysmal atrial fibrillation) (HCC)   2. Stage 3 chronic kidney disease, unspecified whether stage 3a or 3b CKD ([place])   3. Hypothyroidism, unspecified type   4. Essential hypertension   5. Chronic diastolic CHF (congestive heart failure) ([place])   6. Postural dizziness with presyncope      PLAN:  In order of problems listed above:  PAF on amiodarone for rate control no anticoagulation after GI bleed. HR slow and a couple episodes of presyncope and fatigue in the evening. HR 60 with first degree AV block. Will change metoprolol to Toprol XL 25 mg 1/2  daily. Check surveillance labs on Amio  Presyncope/fatigue see above.  CKD stage III check labs today.  Hypothyroidism TSH over 10 in Jan and synthroid adjusted. Will repeat labs today.  Chronic diastolic CHF compensated  HTN controlled   Shared Decision Making/Informed Consent        Medication Adjustments/Labs and Tests Ordered: Current medicines are reviewed at length with the patient today.  Concerns regarding medicines are outlined above.  Medication changes, Labs and Tests ordered today are listed in the Patient Instructions below. Patient Instructions  Medication Instructions:  Your physician recommends that you continue on your current medications as directed. Please refer to the Current Medication list given to you today.  *If you need a refill on your cardiac medications before your next appointment, please call your pharmacy*   Lab Work: TODAY: CMET, CBC, TSH, Free T4, Free T3  If you have labs (blood work) drawn today and your tests are completely normal, you will receive your results only by: Marland Kitchen MyChart Message (if you have MyChart) OR . A paper copy in the mail If you have any lab test that is abnormal or we need to change your treatment, we will call you to review the results.  Follow-Up: At [facility], you and your health needs are our priority.  As part of our continuing mission to provide you with exceptional heart care, we have created designated Provider Care Teams.  These Care Teams include your primary Cardiologist (physician) and Advanced Practice Providers (APPs -  Physician Assistants and Nurse Practitioners) who all work together to provide you with the care you need, when you need it.  Your next appointment:   6 month(s)  The format for your next appointment:   In Person  Provider:   You may see Candee Furbish, MD or one of the following Advanced Practice Providers on your designated Care Team:    Kathyrn Drown, NP  Other Instructions  Bryn Gulling- Long Term Monitor Instructions   Your physician has requested you  wear a ZIO patch monitor for _14__ days.  This is a single patch monitor.   [employer] supplies one patch monitor per enrollment. Additional stickers are not available. Please do not apply patch if you will be having a Nuclear Stress Test, Echocardiogram, Cardiac CT, MRI, or Chest Xray during the period you would be wearing the monitor. The patch cannot be worn during these tests. You cannot remove and re-apply the ZIO XT patch monitor.  Your ZIO patch monitor will be sent Fed Ex from [employer] directly to your home address. It may take 3-5 days to receive your monitor after you have been enrolled.  Once you have received your monitor, please review the enclosed instructions. Your monitor has already been registered assigning a specific monitor serial # to you.  Billing and Patient Assistance Program Information   We have supplied [employer] with any of your insurance information on file for billing purposes. [employer] offers a sliding scale Patient Assistance Program for patients that do not have insurance, or whose insurance does not completely cover the cost of the ZIO monitor.   You must apply for the Patient Assistance Program to qualify for this discounted rate.     To apply, please call [employer] at [phone], select option 4, then select option 2, and ask to apply for Patient Assistance Program.  Theodore Demark will ask your household income, and how many people are in your household.  They will quote your out-of-pocket cost based on that information.  [employer] will also be able to set up a 29-month, interest-free payment plan if needed.   Do not shower for the first 24 hours. You may shower after the first 24 hours.  Press the button if you feel a symptom. You will hear a small click. Record Date, Time and Symptom in the Patient Logbook.  When you are ready to remove the patch, follow instructions on the last 2 pages of the  Patient Logbook. Stick patch monitor onto the last page of Patient Logbook.  Place Patient Logbook in the blue and white box.  Use locking tab on box and tape box closed securely.  The blue and white box has prepaid postage on it. Please place it in the mailbox as soon as possible. Your physician should have your test results approximately 7 days after the monitor has been mailed back to [facility].  Call [place] at [phone] if you have questions regarding your ZIO XT patch monitor. Call them immediately if you see an orange light blinking on your monitor.  If your monitor falls off in less than 4 days, contact our Monitor department at [phone]. ?If your monitor becomes loose or falls off after 4 days call [employer] at [phone] for suggestions on securing your monitor.?      Sumner Boast, PA-C  09/21/2020 [facility] [place], [place], [place]  [address] Phone: [phone]; Fax: [phone]

## 2020-09-21 ENCOUNTER — Encounter: Payer: Self-pay | Admitting: Physician Assistant

## 2020-09-21 ENCOUNTER — Other Ambulatory Visit: Payer: Self-pay

## 2020-09-21 ENCOUNTER — Ambulatory Visit: Payer: Medicare Other | Admitting: Physician Assistant

## 2020-09-21 ENCOUNTER — Ambulatory Visit (INDEPENDENT_AMBULATORY_CARE_PROVIDER_SITE_OTHER): Payer: Medicare Other

## 2020-09-21 VITALS — BP 110/80 | HR 53 | Ht 64.0 in | Wt 152.0 lb

## 2020-09-21 DIAGNOSIS — N183 Chronic kidney disease, stage 3 unspecified: Secondary | ICD-10-CM

## 2020-09-21 DIAGNOSIS — I5032 Chronic diastolic (congestive) heart failure: Secondary | ICD-10-CM

## 2020-09-21 DIAGNOSIS — R55 Syncope and collapse: Secondary | ICD-10-CM | POA: Diagnosis not present

## 2020-09-21 DIAGNOSIS — I10 Essential (primary) hypertension: Secondary | ICD-10-CM

## 2020-09-21 DIAGNOSIS — E039 Hypothyroidism, unspecified: Secondary | ICD-10-CM | POA: Diagnosis not present

## 2020-09-21 DIAGNOSIS — R42 Dizziness and giddiness: Secondary | ICD-10-CM

## 2020-09-21 DIAGNOSIS — I480 Paroxysmal atrial fibrillation: Secondary | ICD-10-CM | POA: Diagnosis not present

## 2020-09-21 MED ORDER — METOPROLOL SUCCINATE ER 25 MG PO TB24: 12.5000 mg | ORAL_TABLET | Freq: Every day | ORAL | 3 refills | Status: DC

## 2020-09-21 NOTE — Patient Instructions (Addendum)
Medication Instructions:  Your physician has recommended you make the following change in your medication:   STOP: Metoprolol Tartrate START: Metoprolol Succinate 25mg  1/2 tablet (12.5mg ) daily  *If you need a refill on your cardiac medications before your next appointment, please call your pharmacy*   Lab Work: TODAY: CMET, CBC, TSH, Free T4, Free T3  If you have labs (blood work) drawn today and your tests are completely normal, you will receive your results only by: Marland Kitchen MyChart Message (if you have MyChart) OR . A paper copy in the mail If you have any lab test that is abnormal or we need to change your treatment, we will call you to review the results.  Follow-Up: At [facility], you and your health needs are our priority.  As part of our continuing mission to provide you with exceptional heart care, we have created designated Provider Care Teams.  These Care Teams include your primary Cardiologist (physician) and Advanced Practice Providers (APPs -  Physician Assistants and Nurse Practitioners) who all work together to provide you with the care you need, when you need it.  Your next appointment:   6 month(s)  The format for your next appointment:   In Person  Provider:   You may see Candee Furbish, MD or one of the following Advanced Practice Providers on your designated Care Team:    Kathyrn Drown, NP  Other Instructions  ZIO XT- Long Term Monitor Instructions   Your physician has requested you wear a ZIO patch monitor for _14__ days.  This is a single patch monitor.   [employer] supplies one patch monitor per enrollment. Additional stickers are not available. Please do not apply patch if you will be having a Nuclear Stress Test, Echocardiogram, Cardiac CT, MRI, or Chest Xray during the period you would be wearing the monitor. The patch cannot be worn during these tests. You cannot remove and re-apply the ZIO XT patch monitor.  Your ZIO patch monitor will be sent Fed Ex from  [employer] directly to your home address. It may take 3-5 days to receive your monitor after you have been enrolled.  Once you have received your monitor, please review the enclosed instructions. Your monitor has already been registered assigning a specific monitor serial # to you.  Billing and Patient Assistance Program Information   We have supplied [employer] with any of your insurance information on file for billing purposes. [employer] offers a sliding scale Patient Assistance Program for patients that do not have insurance, or whose insurance does not completely cover the cost of the ZIO monitor.   You must apply for the Patient Assistance Program to qualify for this discounted rate.     To apply, please call [employer] at [phone], select option 4, then select option 2, and ask to apply for Patient Assistance Program.  Cassandra Ray will ask your household income, and how many people are in your household.  They will quote your out-of-pocket cost based on that information.  [employer] will also be able to set up a 12-month, interest-free payment plan if needed.   Do not shower for the first 24 hours. You may shower after the first 24 hours.  Press the button if you feel a symptom. You will hear a small click. Record Date, Time and Symptom in the Patient Logbook.  When you are ready to remove the patch, follow instructions on the last 2 pages of the Patient Logbook. Stick patch monitor onto the last page of Patient Logbook.  Place Patient Logbook in the blue and white box.  Use locking tab on box and tape box closed securely.  The blue and white box has prepaid postage on it. Please place it in the mailbox as soon as possible. Your physician should have your test results approximately 7 days after the monitor has been mailed back to [facility].  Call [place] at [phone] if you have questions regarding your ZIO XT patch monitor. Call them immediately if you see an orange  light blinking on your monitor.  If your monitor falls off in less than 4 days, contact our Monitor department at [phone]. ?If your monitor becomes loose or falls off after 4 days call [employer] at [phone] for suggestions on securing your monitor.?

## 2020-09-22 LAB — CBC
Hematocrit: 39.6 % (ref 34.0–46.6)
Hemoglobin: 13.2 g/dL (ref 11.1–15.9)
MCH: 32.5 pg (ref 26.6–33.0)
MCHC: 33.3 g/dL (ref 31.5–35.7)
MCV: 98 fL — ABNORMAL HIGH (ref 79–97)
Platelets: 172 x10E3/uL (ref 150–450)
RBC: 4.06 x10E6/uL (ref 3.77–5.28)
RDW: 12.2 % (ref 11.7–15.4)
WBC: 7 x10E3/uL (ref 3.4–10.8)

## 2020-09-22 LAB — COMPREHENSIVE METABOLIC PANEL
ALT: 18 IU/L (ref 0–32)
AST: 31 IU/L (ref 0–40)
Albumin/Globulin Ratio: 1.8 (ref 1.2–2.2)
Albumin: 4.2 g/dL (ref 3.5–4.6)
Alkaline Phosphatase: 103 IU/L (ref 44–121)
BUN/Creatinine Ratio: 18 (ref 12–28)
BUN: 30 mg/dL (ref 10–36)
Bilirubin Total: 0.5 mg/dL (ref 0.0–1.2)
CO2: 23 mmol/L (ref 20–29)
Calcium: 9.4 mg/dL (ref 8.7–10.3)
Chloride: 94 mmol/L — ABNORMAL LOW (ref 96–106)
Creatinine, Ser: 1.65 mg/dL — ABNORMAL HIGH (ref 0.57–1.00)
Globulin, Total: 2.4 g/dL (ref 1.5–4.5)
Glucose: 77 mg/dL (ref 65–99)
Potassium: 4.3 mmol/L (ref 3.5–5.2)
Sodium: 135 mmol/L (ref 134–144)
Total Protein: 6.6 g/dL (ref 6.0–8.5)
eGFR: 28 mL/min/{1.73_m2} — ABNORMAL LOW (ref >59)

## 2020-09-22 LAB — T3, FREE: T3, Free: 1.2 pg/mL — ABNORMAL LOW (ref 2.0–4.4)

## 2020-09-22 LAB — T4, FREE: Free T4: 1.96 ng/dL — ABNORMAL HIGH (ref 0.82–1.77)

## 2020-09-22 LAB — TSH: TSH: 3.28 u[IU]/mL (ref 0.450–4.500)

## 2020-10-01 ENCOUNTER — Other Ambulatory Visit: Payer: Self-pay | Admitting: Cardiology

## 2020-10-07 DIAGNOSIS — I480 Paroxysmal atrial fibrillation: Secondary | ICD-10-CM | POA: Diagnosis not present

## 2020-12-07 DIAGNOSIS — H903 Sensorineural hearing loss, bilateral: Secondary | ICD-10-CM | POA: Diagnosis not present

## 2020-12-15 DIAGNOSIS — I509 Heart failure, unspecified: Secondary | ICD-10-CM | POA: Diagnosis not present

## 2020-12-15 DIAGNOSIS — N2581 Secondary hyperparathyroidism of renal origin: Secondary | ICD-10-CM | POA: Diagnosis not present

## 2020-12-15 DIAGNOSIS — I129 Hypertensive chronic kidney disease with stage 1 through stage 4 chronic kidney disease, or unspecified chronic kidney disease: Secondary | ICD-10-CM | POA: Diagnosis not present

## 2020-12-15 DIAGNOSIS — N184 Chronic kidney disease, stage 4 (severe): Secondary | ICD-10-CM | POA: Diagnosis not present

## 2020-12-15 DIAGNOSIS — I480 Paroxysmal atrial fibrillation: Secondary | ICD-10-CM | POA: Diagnosis not present

## 2020-12-29 DIAGNOSIS — I10 Essential (primary) hypertension: Secondary | ICD-10-CM | POA: Diagnosis not present

## 2020-12-29 DIAGNOSIS — J45998 Other asthma: Secondary | ICD-10-CM | POA: Diagnosis not present

## 2020-12-29 DIAGNOSIS — K219 Gastro-esophageal reflux disease without esophagitis: Secondary | ICD-10-CM | POA: Diagnosis not present

## 2020-12-29 DIAGNOSIS — I509 Heart failure, unspecified: Secondary | ICD-10-CM | POA: Diagnosis not present

## 2020-12-29 DIAGNOSIS — I4891 Unspecified atrial fibrillation: Secondary | ICD-10-CM | POA: Diagnosis not present

## 2020-12-29 DIAGNOSIS — N184 Chronic kidney disease, stage 4 (severe): Secondary | ICD-10-CM | POA: Diagnosis not present

## 2020-12-29 DIAGNOSIS — M8580 Other specified disorders of bone density and structure, unspecified site: Secondary | ICD-10-CM | POA: Diagnosis not present

## 2020-12-29 DIAGNOSIS — E039 Hypothyroidism, unspecified: Secondary | ICD-10-CM | POA: Diagnosis not present

## 2021-01-17 ENCOUNTER — Ambulatory Visit
Admission: RE | Admit: 2021-01-17 | Discharge: 2021-01-17 | Disposition: A | Discharge status: Home / Self Care | Payer: Medicare Other | Source: Ambulatory Visit | Attending: Family Medicine | Admitting: Family Medicine

## 2021-01-17 ENCOUNTER — Other Ambulatory Visit: Payer: Self-pay | Admitting: Family Medicine

## 2021-01-17 DIAGNOSIS — W19XXXA Unspecified fall, initial encounter: Secondary | ICD-10-CM | POA: Diagnosis not present

## 2021-01-17 DIAGNOSIS — M545 Low back pain, unspecified: Secondary | ICD-10-CM | POA: Diagnosis not present

## 2021-01-17 DIAGNOSIS — Z043 Encounter for examination and observation following other accident: Secondary | ICD-10-CM | POA: Diagnosis not present

## 2021-01-17 DIAGNOSIS — M48061 Spinal stenosis, lumbar region without neurogenic claudication: Secondary | ICD-10-CM | POA: Diagnosis not present

## 2021-01-17 DIAGNOSIS — M47816 Spondylosis without myelopathy or radiculopathy, lumbar region: Secondary | ICD-10-CM | POA: Diagnosis not present

## 2021-01-17 DIAGNOSIS — M8580 Other specified disorders of bone density and structure, unspecified site: Secondary | ICD-10-CM | POA: Diagnosis not present

## 2021-01-17 IMAGING — CR DG LUMBAR SPINE 2-3V
3 series · 3 of 3 positions shown · non-contrast
Comparison: None

CLINICAL DATA: Fall, initial encounter

EXAM:
LUMBAR SPINE - 2-3 VIEW

[t lumbar spine ap]
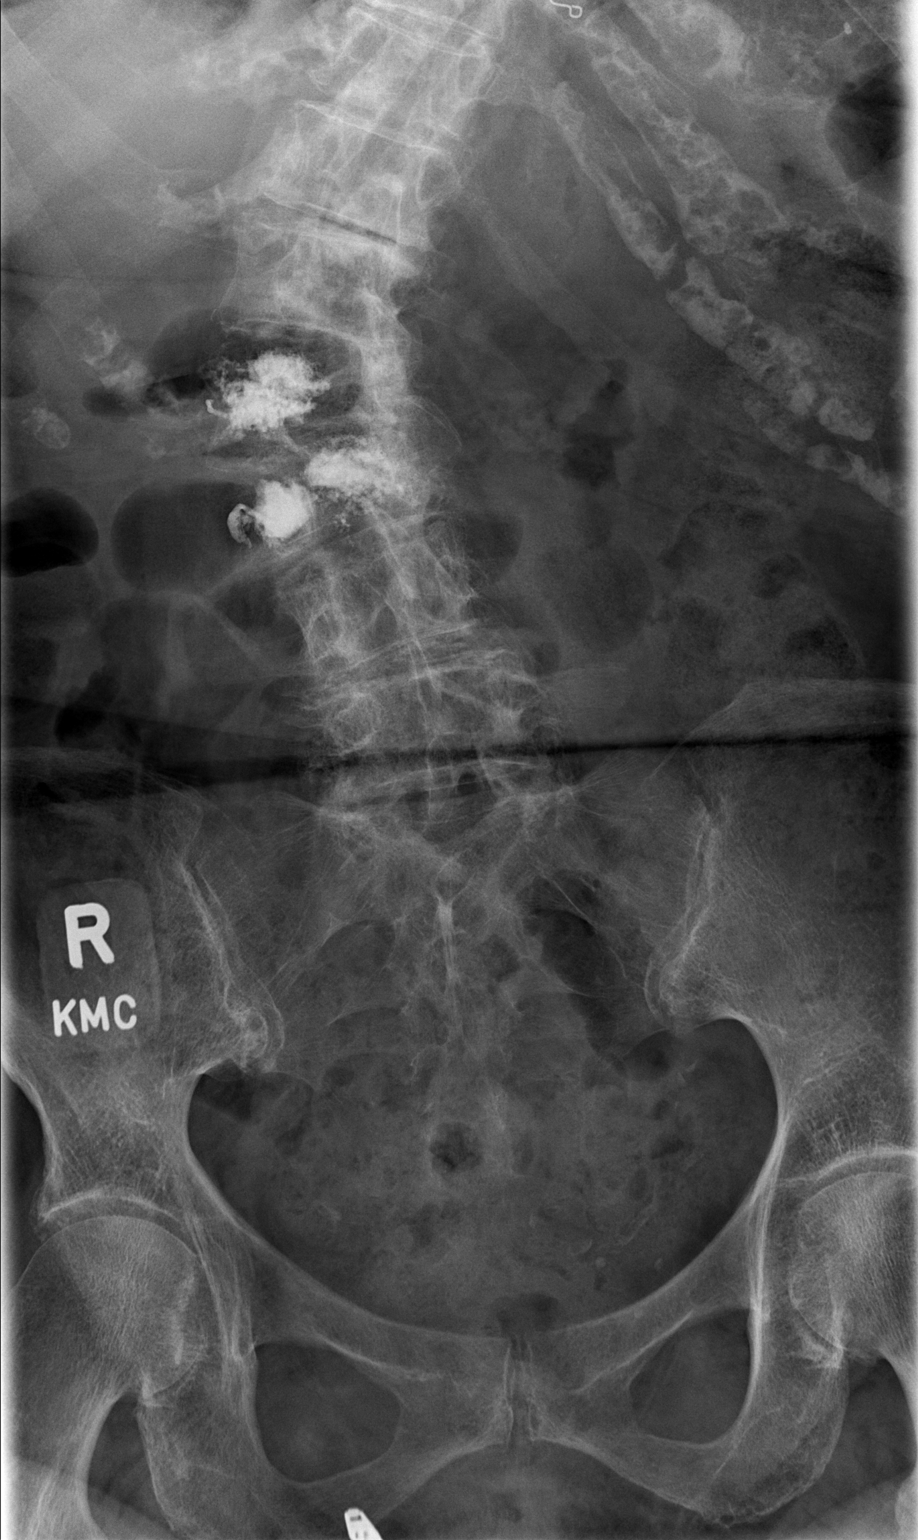

[t lumbar spine lat]
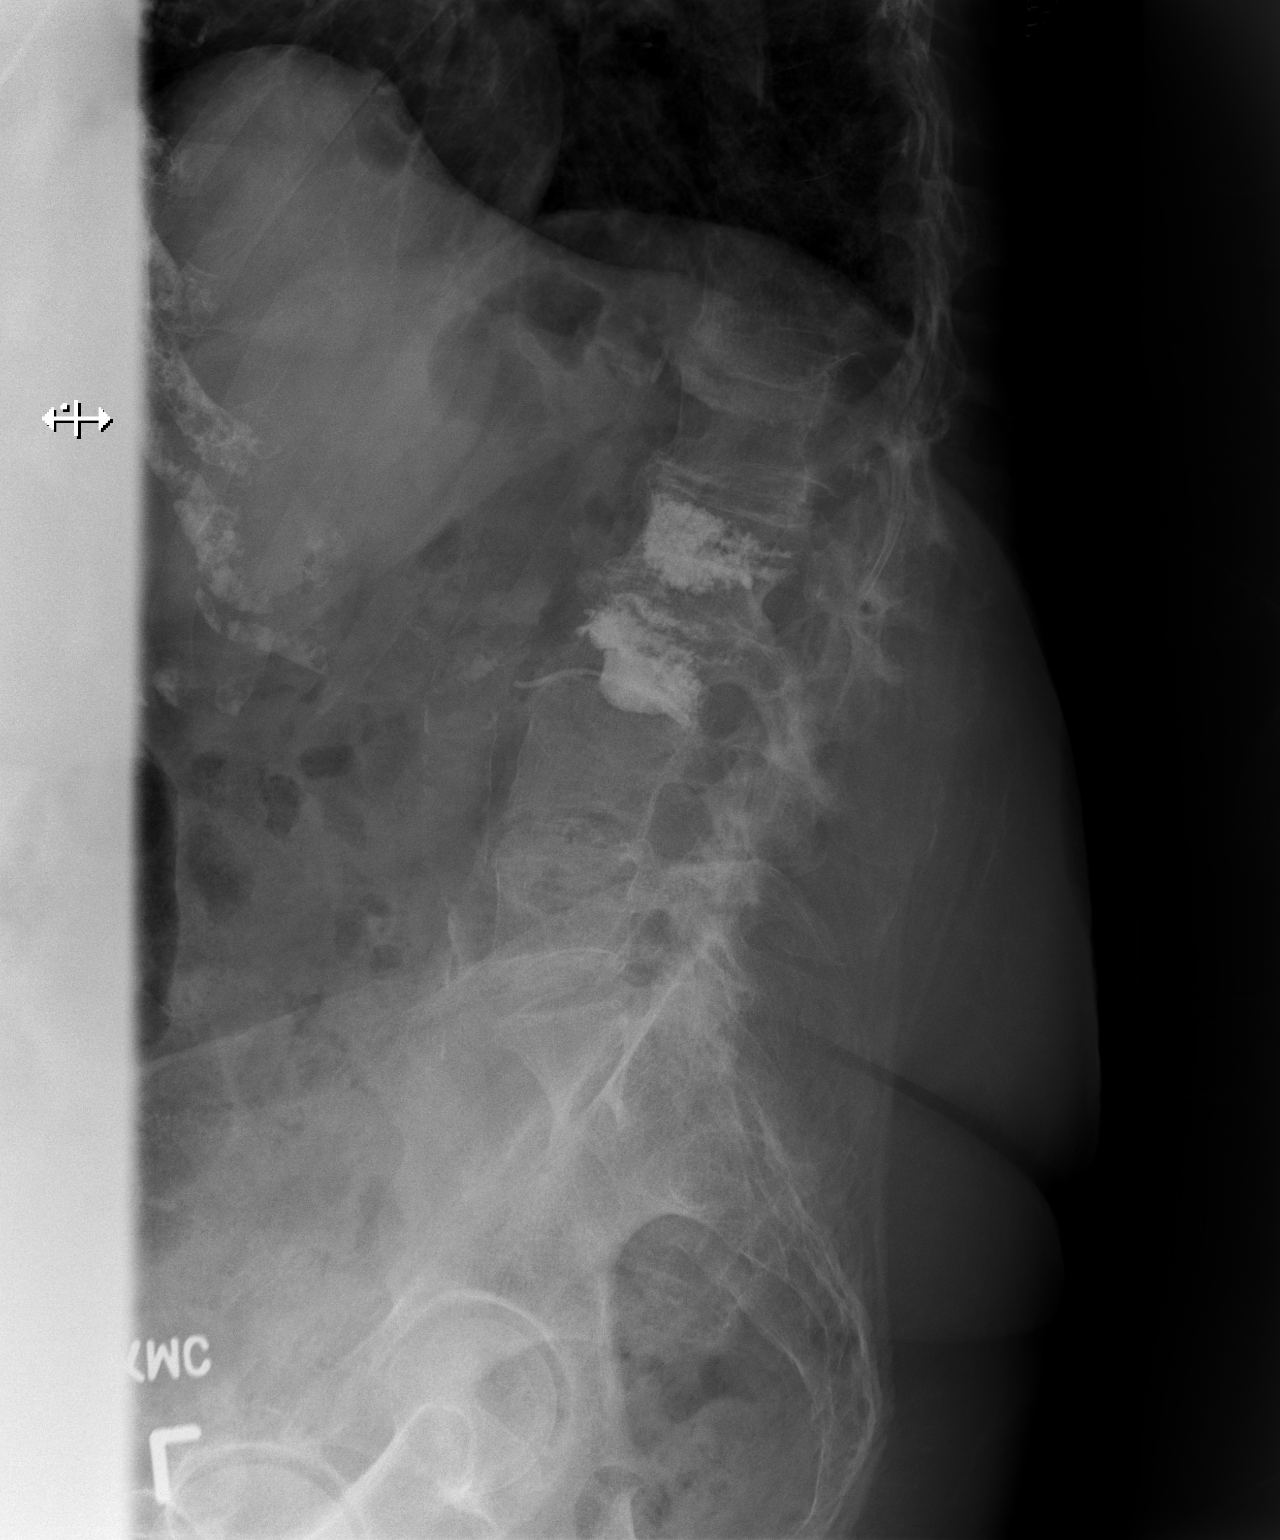

[t lumbar l-5 s-1 spot]
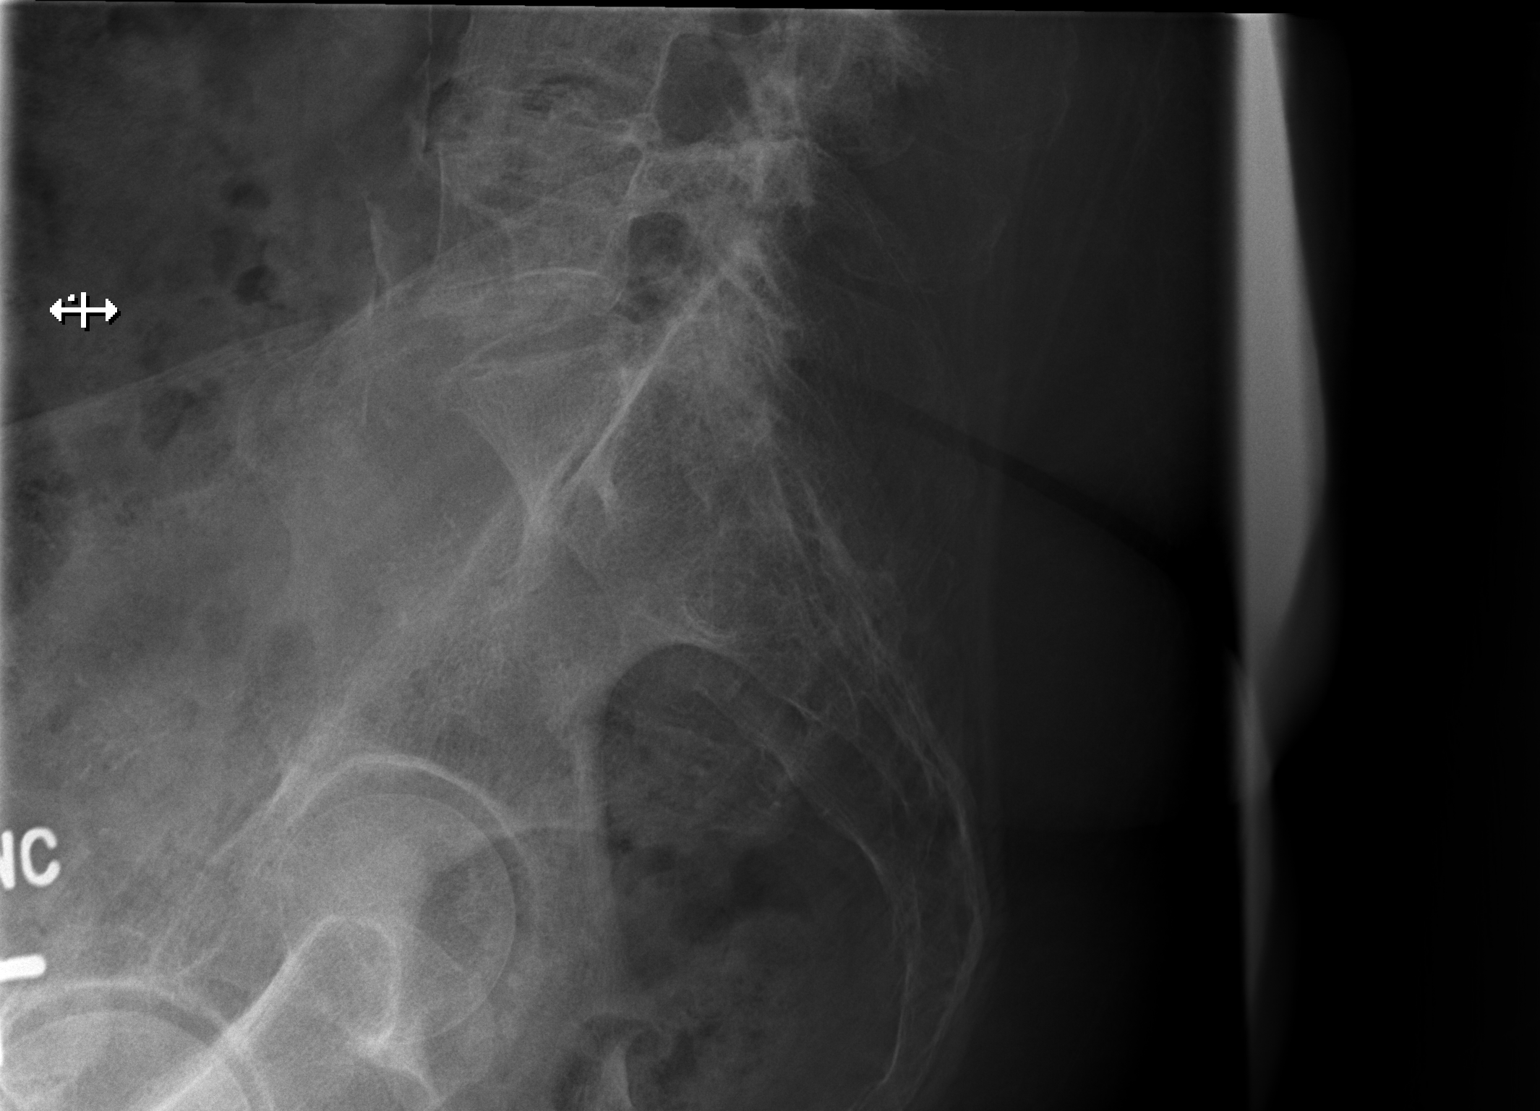

[3 of 3 positions shown; findings below may reference images not displayed]

FINDINGS: Five non-rib-bearing lumbar vertebra.

Diffuse osseous demineralization.

Prior vertebroplasties of L2 and L3.

Pronounced dextroconvex thoracolumbar scoliosis.

Multilevel disc space narrowing and facet degenerative changes.

No fracture, subluxation, or bone destruction.

SI joints preserved.

Atherosclerotic calcifications aorta.
IMPRESSION: Osseous demineralization with multilevel degenerative disc and facet
disease changes of lumbar spine with dextroconvex thoracolumbar
scoliosis.

Prior spinal augmentation procedures of compression fractures at L2
and L3.

No acute abnormalities.

Aortic Atherosclerosis ([4D]-[4D]).

## 2021-01-21 ENCOUNTER — Non-Acute Institutional Stay (SKILLED_NURSING_FACILITY): Payer: Medicare Other | Admitting: Adult Health

## 2021-01-21 ENCOUNTER — Encounter: Payer: Self-pay | Admitting: Adult Health

## 2021-01-21 DIAGNOSIS — K5901 Slow transit constipation: Secondary | ICD-10-CM

## 2021-01-21 DIAGNOSIS — I10 Essential (primary) hypertension: Secondary | ICD-10-CM | POA: Diagnosis not present

## 2021-01-21 DIAGNOSIS — I5032 Chronic diastolic (congestive) heart failure: Secondary | ICD-10-CM

## 2021-01-21 DIAGNOSIS — E039 Hypothyroidism, unspecified: Secondary | ICD-10-CM | POA: Diagnosis not present

## 2021-01-21 DIAGNOSIS — H9113 Presbycusis, bilateral: Secondary | ICD-10-CM

## 2021-01-21 DIAGNOSIS — D649 Anemia, unspecified: Secondary | ICD-10-CM | POA: Diagnosis not present

## 2021-01-21 DIAGNOSIS — S39012A Strain of muscle, fascia and tendon of lower back, initial encounter: Secondary | ICD-10-CM | POA: Diagnosis not present

## 2021-01-21 DIAGNOSIS — I4891 Unspecified atrial fibrillation: Secondary | ICD-10-CM | POA: Diagnosis not present

## 2021-01-21 DIAGNOSIS — R531 Weakness: Secondary | ICD-10-CM

## 2021-01-21 LAB — COMPREHENSIVE METABOLIC PANEL: Calcium: 9.4 (ref 8.7–10.7)

## 2021-01-21 LAB — CBC AND DIFFERENTIAL
HCT: 38 (ref 36–46)
Hemoglobin: 12.7 (ref 12.0–16.0)
Platelets: 224 (ref 150–399)
WBC: 8.9

## 2021-01-21 LAB — BASIC METABOLIC PANEL
BUN: 30 — AB (ref 4–21)
CO2: 25 — AB (ref 13–22)
Chloride: 96 — AB (ref 99–108)
Creatinine: 1.9 — AB (ref 0.5–1.1)
Glucose: 93
Potassium: 4.1 (ref 3.4–5.3)
Sodium: 136 — AB (ref 137–147)

## 2021-01-21 LAB — CBC: RBC: 3.93 (ref 3.87–5.11)

## 2021-01-21 MED ORDER — BISACODYL 10 MG RE SUPP: 10.0000 mg | Freq: Every day | RECTAL | Status: DC | PRN

## 2021-01-21 MED ORDER — TRAMADOL HCL 50 MG PO TABS
25.0000 mg | ORAL_TABLET | Freq: Three times a day (TID) | ORAL | 0 refills | Status: DC | PRN
Start: 2021-01-21 — End: 2021-01-26

## 2021-01-21 MED ORDER — ACETAMINOPHEN 500 MG PO TABS: 1000.0000 mg | ORAL_TABLET | Freq: Three times a day (TID) | ORAL | 0 refills | Status: DC

## 2021-01-21 MED ORDER — SENNA-DOCUSATE SODIUM 8.6-50 MG PO TABS
1.0000 | ORAL_TABLET | Freq: Every evening | ORAL | 0 refills | Status: DC | PRN
Start: 2021-01-21 — End: 2023-05-29

## 2021-01-21 MED ORDER — POLYETHYLENE GLYCOL 3350 17 GM/SCOOP PO POWD: 17.0000 g | Freq: Every day | ORAL | 1 refills | Status: DC

## 2021-01-21 NOTE — Progress Notes (Signed)
Location:   [facility] Room Number: [place] of Service:  SNF [phone]) Provider:  Royal Hawthorn, NP  Lawerance Cruel, MD  Patient Care Team: Lawerance Cruel, MD as PCP - General (Family Medicine) Jerline Pain, MD as PCP - Cardiology (Cardiology)  Extended Emergency Contact Information Primary Emergency Contact: Aris [place] Address: [address]          [place], [place] [address] Johnnette Litter of [place] Phone: [phone] Mobile Phone: [phone] Relation: Spouse Secondary Emergency Contact: Arna Snipe Address: [address] [place] [place] [address] Johnnette Litter of [place] Phone: [phone] Work Phone: [phone] Mobile Phone: [phone] Relation: Daughter  Code Status:  Full Code Goals of care: Advanced Directive information Advanced Directives 01/21/2021  Does Patient Have a Medical Advance Directive? Yes  Type of Paramedic of [place];Living will  Does patient want to make changes to medical advance directive? No - Patient declined  Copy of [place] in Chart? Yes - validated most recent copy scanned in chart (See row information)  Would patient like information on creating a medical advance directive? -     Chief Complaint  Patient presents with   Acute Visit    s/p fall     HPI:  Pt is a 87 y.o. female seen today for an acute visit for s/p fall. PMH significant for breast ca, CKD< CHF< arthritis, compression fx, HTN< hiatal hernia, dysphagia, hypothyroid, insomnia, afib, PAT, pleural effusion.  She fell on 8/8 by slipping and felt buttock and back pain afterward.  She saw Dr. Harrington Challenger and xrays of the lumbar and sacral spine were ordered due to pain. Lumbar xray showed DDD and scoliosis but no acute fracture. Sacral coccyx xray was negative. She was admitted to [facility] on 8/18 due to continued pain and inability to self care and  uncontrolled pain. She had been doing heat, tylenol, advil at home. She describes her pain as severe with movement and improved with rest. She does not have any radicular symptoms. No numbness or tingling or change in B/B habits.  She is HOH and has hearing aids. This is worsening and she is also having some issues comprehending what the staff is saying.    Past Medical History:  Diagnosis Date   Actinic keratoses    Arthritis    back    Breast cancer ([place])    Breast cancer (right)   Chronic diastolic CHF (congestive heart failure) (HCC)    CKD (chronic kidney disease), stage III (HCC)    Compression fracture of L3 lumbar vertebra    Dysphagia    GERD (gastroesophageal reflux disease)    takes otc Pepcid as needed   Hiatal hernia 11/27/2014   HTN (hypertension)    Hypothyroidism    Insomnia    Osteopenia    PAF (paroxysmal atrial fibrillation) ([place])    a. questionable episode in 2015 in [place]. b. event monitor through December/January 2014/2015 which showed no evidence of atrial fibrillation. c. Dx 12/2018.   PAT (paroxysmal atrial tachycardia) (HCC)    Pleural effusion    Past Surgical History:  Procedure Laterality Date   BREAST LUMPECTOMY Right 1995   lumpectomy   COLONOSCOPY     ESOPHAGOGASTRODUODENOSCOPY (EGD) WITH PROPOFOL Left 01/09/2019   Procedure: ESOPHAGOGASTRODUODENOSCOPY (EGD) WITH PROPOFOL;  Surgeon: Carol Ada, MD;  Location: [facility];  Service: Endoscopy;  Laterality: Left;   EYE SURGERY  Bilateral    cataract w/ lens implant   KYPHOPLASTY N/A 09/18/2013   Procedure: KYPHOPLASTY;  Surgeon: Sinclair Ship, MD;  Location: [facility];  Service: Orthopedics;  Laterality: N/A;  Lumbar 3 kyphoplasty   TONSILLECTOMY      Allergies  Allergen Reactions   Tape Other (See Comments)    Patient prefers easy-release tape    Allergies as of 01/21/2021       Reactions   Tape Other (See Comments)   Patient prefers easy-release tape        Medication List         Accurate as of January 21, 2021 10:51 AM. If you have any questions, ask your nurse or doctor.          STOP taking these medications    CITRACAL +D3 PO Stopped by: Royal Hawthorn, NP   lipase/protease/amylase 36000 UNITS Cpep capsule Commonly known as: CREON Stopped by: Royal Hawthorn, NP   metoprolol succinate 25 MG 24 hr tablet Commonly known as: Toprol XL Stopped by: Royal Hawthorn, NP       TAKE these medications    acetaminophen 500 MG tablet Commonly known as: TYLENOL Take 500 mg by mouth every 6 (six) hours as needed.   Align 4 MG Caps Take 4 mg by mouth daily.   amiodarone 200 MG tablet Commonly known as: PACERONE TAKE ONE TABLET BY MOUTH DAILY   B-complex with vitamin C tablet Take 1 tablet by mouth daily.   furosemide 40 MG tablet Commonly known as: LASIX TAKE 1 TABLET ONCE DAILY.   ibuprofen 200 MG tablet Commonly known as: ADVIL Take 200 mg by mouth every 6 (six) hours as needed.   ipratropium 0.03 % nasal spray Commonly known as: ATROVENT Place 2 sprays into both nostrils 2 (two) times daily.   levothyroxine 100 MCG tablet Commonly known as: SYNTHROID Take 100 mcg by mouth every morning.   lidocaine 5 % Commonly known as: LIDODERM Place 1 patch onto the skin in the morning and at bedtime. Remove & Discard patch within 12 hours or as directed by MD   loperamide 2 MG tablet Commonly known as: IMODIUM A-D Take 2 mg by mouth daily as needed for diarrhea or loose stools.   melatonin 5 MG Tabs Take 5 mg by mouth at bedtime as needed.   metoprolol tartrate 25 MG tablet Commonly known as: LOPRESSOR Take 12.5 mg by mouth 2 (two) times daily.   nitroGLYCERIN 0.4 MG SL tablet Commonly known as: NITROSTAT Place 1 tablet (0.4 mg total) under the tongue every 5 (five) minutes as needed for chest pain.   pantoprazole 40 MG tablet Commonly known as: PROTONIX Take 1 tablet (40 mg total) by mouth daily.   Vitamin D3 50 MCG (2000 UT)  Tabs Take 2,000 Units by mouth daily.        Review of Systems  Constitutional:  Positive for activity change. Negative for appetite change (eating less), chills, diaphoresis, fatigue, fever and unexpected weight change.  HENT:  Positive for hearing loss. Negative for congestion.   Eyes:  Negative for visual disturbance.  Respiratory:  Negative for cough, shortness of breath and wheezing.   Cardiovascular:  Negative for chest pain, palpitations and leg swelling.  Gastrointestinal:  Positive for constipation. Negative for abdominal distention, abdominal pain, diarrhea, nausea, rectal pain and vomiting.  Genitourinary:  Negative for difficulty urinating and dysuria.  Musculoskeletal:  Positive for back pain and gait problem. Negative for arthralgias, joint swelling and myalgias.  Neurological:  Negative for dizziness, tremors, seizures, syncope, facial asymmetry, speech difficulty, weakness, light-headedness, numbness and headaches.  Psychiatric/Behavioral:  Negative for agitation, behavioral problems and confusion.     There is no immunization history on file for this patient. Pertinent  Health Maintenance Due  Topic Date Due   PNA vac Low Risk Adult (1 of 2 - PCV13) Never done   INFLUENZA VACCINE  01/03/2021   DEXA SCAN  Completed   No flowsheet data found. Functional Status Survey:    Vitals:   01/21/21 1025  BP: 138/84  Pulse: 67  Resp: 20  Temp: 98 F (36.7 C)  SpO2: 99%  Weight: 162 lb (73.5 kg)  Height: 5' 3.8" (1.621 m)   Body mass index is 27.98 kg/m. Physical Exam Vitals and nursing note reviewed.  Constitutional:      General: She is not in acute distress.    Appearance: She is not diaphoretic.  HENT:     Head: Normocephalic and atraumatic.     Right Ear: Tympanic membrane normal. There is no impacted cerumen.     Left Ear: Tympanic membrane normal. There is no impacted cerumen.     Nose: Nose normal. No congestion.     Mouth/Throat:     Mouth: Mucous  membranes are moist.     Pharynx: Oropharynx is clear.  Eyes:     Conjunctiva/sclera: Conjunctivae normal.     Pupils: Pupils are equal, round, and reactive to light.  Neck:     Vascular: No JVD.  Cardiovascular:     Rate and Rhythm: Normal rate and regular rhythm.     Heart sounds: No murmur heard. Pulmonary:     Effort: Pulmonary effort is normal. No respiratory distress.     Breath sounds: Normal breath sounds. No wheezing.  Abdominal:     General: There is no distension.     Palpations: Abdomen is soft.     Tenderness: There is no abdominal tenderness.     Hernia: Hernia: dulcolax.     Comments: Hypoactive x 4  Musculoskeletal:        General: Tenderness (lumbar sacral area) present. No swelling, deformity or signs of injury.     Cervical back: No rigidity or tenderness.     Right lower leg: No edema.     Left lower leg: No edema.  Lymphadenopathy:     Cervical: No cervical adenopathy.  Skin:    General: Skin is warm and dry.  Neurological:     Mental Status: She is alert and oriented to person, place, and time.  Psychiatric:        Mood and Affect: Mood normal.    Labs reviewed: Recent Labs    09/21/20 1358  NA 135  K 4.3  CL 94*  CO2 23  GLUCOSE 77  BUN 30  CREATININE 1.65*  CALCIUM 9.4   Recent Labs    09/21/20 1358  AST 31  ALT 18  ALKPHOS 103  BILITOT 0.5  PROT 6.6  ALBUMIN 4.2   Recent Labs    09/21/20 1358  WBC 7.0  HGB 13.2  HCT 39.6  MCV 98*  PLT 172   Lab Results  Component Value Date   TSH 3.280 09/21/2020   No results found for: HGBA1C Lab Results  Component Value Date   CHOL 128 12/13/2018   HDL 79 12/13/2018   LDLCALC 36 12/13/2018   TRIG 63 12/13/2018   CHOLHDL 1.6 12/13/2018    Significant Diagnostic Results in  last 30 days:  DG Lumbar Spine 2-3 Views  Result Date: 01/17/2021 CLINICAL DATA:  Fall, initial encounter EXAM: LUMBAR SPINE - 2-3 VIEW COMPARISON:  None FINDINGS: Five non-rib-bearing lumbar vertebra.  Diffuse osseous demineralization. Prior vertebroplasties of L2 and L3. Pronounced dextroconvex thoracolumbar scoliosis. Multilevel disc space narrowing and facet degenerative changes. No fracture, subluxation, or bone destruction. SI joints preserved. Atherosclerotic calcifications aorta. IMPRESSION: Osseous demineralization with multilevel degenerative disc and facet disease changes of lumbar spine with dextroconvex thoracolumbar scoliosis. Prior spinal augmentation procedures of compression fractures at L2 and L3. No acute abnormalities. Aortic Atherosclerosis (ICD10-I70.0). Electronically Signed   By: Lavonia Dana M.D.   On: 01/17/2021 15:23   DG Sacrum/Coccyx  Result Date: 01/17/2021 CLINICAL DATA:  Fall, initial encounter EXAM: SACRUM AND COCCYX - 2+ VIEW COMPARISON:  None FINDINGS: Minimal narrowing of the hip joints. SI joints preserved. Degenerative disc and facet disease changes at visualized lower lumbar spine. No definite sacrococcygeal fracture identified. IMPRESSION: No definite sacrococcygeal fracture identified. Electronically Signed   By: Lavonia Dana M.D.   On: 01/17/2021 15:24    Assessment/Plan  1. Strain of lumbar region, initial encounter Continue heat and salonpas.  PT and OT Pt expresses to avoid anything that would cause her to be lethargic.  If not improving would recommend CT of the spin  - traMADol (ULTRAM) 50 MG tablet; Take 0.5 tablets (25 mg total) by mouth every 8 (eight) hours as needed for up to 5 days.  Dispense: 8 tablet; Refill: 0 - acetaminophen (TYLENOL) 500 MG tablet; Take 2 tablets (1,000 mg total) by mouth every 8 (eight) hours.  Dispense: 90 tablet; Refill: 0  2. Slow transit constipation  - polyethylene glycol powder (GLYCOLAX/MIRALAX) 17 GM/SCOOP powder; Take 17 g by mouth daily.  Dispense: 3350 g; Refill: 1 - sennosides-docusate sodium (SENOKOT-S) 8.6-50 MG tablet; Take 1 tablet by mouth at bedtime as needed for constipation.  Dispense: 30 tablet;  Refill: 0 - bisacodyl (DULCOLAX) suppository 10 mg  3. Primary hypertension Controlled  4. Chronic diastolic heart failure (HCC) Continues on lasix 40 mg qd No edema with shriveled skin on her leg, and is not eating and drinking as well as usual Will check labs   5. Atrial fibrillation, unspecified type ([place]) Rate is controlled on amiodarone Not on anticoagulation due to hx of GIB  6. Hypothyroidism, unspecified type Lab Results  Component Value Date   TSH 3.280 09/21/2020  Continue Synthroid 100 mcg    7. Presbycusis of both ears Checked her ears which are not impacted Her husband said he could reach out to the audiologist  Seems to have some comprehension issues.   8. Weakness Due to fall and pain PT and OT eval and tx   Family/ staff Communication: discussed with the resident and her husband  Labs/tests ordered:   CBC BMP

## 2021-01-24 ENCOUNTER — Encounter: Payer: Self-pay | Admitting: Internal Medicine

## 2021-01-24 ENCOUNTER — Non-Acute Institutional Stay (SKILLED_NURSING_FACILITY): Payer: Medicare Other | Admitting: Internal Medicine

## 2021-01-24 DIAGNOSIS — N1832 Chronic kidney disease, stage 3b: Secondary | ICD-10-CM | POA: Diagnosis not present

## 2021-01-24 DIAGNOSIS — S39012A Strain of muscle, fascia and tendon of lower back, initial encounter: Secondary | ICD-10-CM

## 2021-01-24 DIAGNOSIS — I10 Essential (primary) hypertension: Secondary | ICD-10-CM | POA: Diagnosis not present

## 2021-01-24 DIAGNOSIS — I5032 Chronic diastolic (congestive) heart failure: Secondary | ICD-10-CM | POA: Diagnosis not present

## 2021-01-24 DIAGNOSIS — E039 Hypothyroidism, unspecified: Secondary | ICD-10-CM

## 2021-01-24 DIAGNOSIS — I4891 Unspecified atrial fibrillation: Secondary | ICD-10-CM

## 2021-01-24 NOTE — Progress Notes (Signed)
Provider:  Veleta Miners MD Location:   [facility] Room Number: 161 Place of Service:  SNF ([phone])  PCP: Lawerance Cruel, MD Patient Care Team: Lawerance Cruel, MD as PCP - General (Family Medicine) Jerline Pain, MD as PCP - Cardiology (Cardiology)  Extended Emergency Contact Information Primary Emergency Contact: Aris Georgia Address: [address]          [place], [place] [address] Johnnette Litter of [place] Phone: [phone] Mobile Phone: [phone] Relation: Spouse Secondary Emergency Contact: Arna Snipe Address: [address] [place] [address] Johnnette Litter of [place] Phone: [phone] Work Phone: [phone] Mobile Phone: [phone] Relation: Daughter  Code Status:  Full Code Goals of Care: Advanced Directive information Advanced Directives 01/24/2021  Does Patient Have a Medical Advance Directive? Yes  Type of Paramedic of [place];Living will  Does patient want to make changes to medical advance directive? No - Patient declined  Copy of [place] in Chart? Yes - validated most recent copy scanned in chart (See row information)  Would patient like information on creating a medical advance directive? -      Chief Complaint  Patient presents with   New Admit To SNF    Admission to SNF     HPI: Patient is a 87 y.o. female seen today for admission to SNF for Therapy after fall in her apartment  Patient has h/o Breast Cancer CKD, CHF Compression Fractures in the past , HTN, Hypothyroid, PAF,   Patient fell in her apartment and since then has been having lower Back pain Was seen by Dr Harrington Challenger and had Xray done of lower back which did not  show any Fractures Transferred  to SNF as unable to take care of herself in her apartment Lives with her husband Does not want to take anything stronger for pain Her only complain today was pain in her  lower back Looks in discomfort Able to get up with her walker No Focal deficits ar any other Red flag signs    Past Medical History:  Diagnosis Date   Actinic keratoses    Arthritis    back    Breast cancer ([place])    Breast cancer (right)   Chronic diastolic CHF (congestive heart failure) ([place])    CKD (chronic kidney disease), stage III ([employer])    Compression fracture of L3 lumbar vertebra    Dysphagia    GERD (gastroesophageal reflux disease)    takes otc Pepcid as needed   Hiatal hernia 11/27/2014   HTN (hypertension)    Hypothyroidism    Insomnia    Osteopenia    PAF (paroxysmal atrial fibrillation) ([place])    a. questionable episode in 2015 in [place]. b. event monitor through December/January 2014/2015 which showed no evidence of atrial fibrillation. c. Dx 12/2018.   PAT (paroxysmal atrial tachycardia) (HCC)    Pleural effusion    Past Surgical History:  Procedure Laterality Date   BREAST LUMPECTOMY Right 1995   lumpectomy   COLONOSCOPY     ESOPHAGOGASTRODUODENOSCOPY (EGD) WITH PROPOFOL Left 01/09/2019   Procedure: ESOPHAGOGASTRODUODENOSCOPY (EGD) WITH PROPOFOL;  Surgeon: Carol Ada, MD;  [facility];  Service: Endoscopy;  Laterality: Left;   EYE SURGERY Bilateral    cataract w/ lens implant   KYPHOPLASTY N/A 09/18/2013   Procedure: KYPHOPLASTY;  Surgeon: Sinclair Ship, MD;  Location: [facility];  Service: Orthopedics;  Laterality: N/A;  Lumbar 3 kyphoplasty   TONSILLECTOMY      reports that she has quit smoking. She has never used smokeless tobacco. She reports current alcohol use of about 7.0 standard drinks per week. She reports that she does not use drugs. Social History   Socioeconomic History   Marital status: Married    Spouse name: Not on file   Number of children: Not on file   Years of education: Not on file   Highest education level: Not on file  Occupational History   Not on file  Tobacco Use   Smoking status: Former   Smokeless tobacco:  Never   Tobacco comments:    only in college  Vaping Use   Vaping Use: Never used  Substance and Sexual Activity   Alcohol use: Yes    Alcohol/week: 7.0 standard drinks    Types: 7 Glasses of wine per week    Comment: 1 glass of wine a day   Drug use: No   Sexual activity: Not on file  Other Topics Concern   Not on file  Social History Narrative   Not on file   Social Determinants of Health   Financial Resource Strain: Not on file  Food Insecurity: Not on file  Transportation Needs: Not on file  Physical Activity: Not on file  Stress: Not on file  Social Connections: Not on file  Intimate Partner Violence: Not on file    Functional Status Survey:    Family History  Problem Relation Age of Onset   CAD Mother    Diabetes Mellitus II Mother    Heart failure Mother    Heart failure Father    Prostate cancer Brother    Kidney cancer Brother    Heart disease Sister    Esophageal cancer Neg Hx    Colon cancer Neg Hx     Health Maintenance  Topic Date Due   COVID-19 Vaccine (1) Never done   TETANUS/TDAP  Never done   Zoster Vaccines- Shingrix (1 of 2) Never done   PNA vac Low Risk Adult (1 of 2 - PCV13) Never done   INFLUENZA VACCINE  01/03/2021   DEXA SCAN  Completed   HPV VACCINES  Aged Out    Allergies  Allergen Reactions   Tape Other (See Comments)    Patient prefers easy-release tape    Allergies as of 01/24/2021       Reactions   Tape Other (See Comments)   Patient prefers easy-release tape        Medication List        Accurate as of January 24, 2021 10:35 AM. If you have any questions, ask your nurse or doctor.          acetaminophen 500 MG tablet Commonly known as: TYLENOL Take 2 tablets (1,000 mg total) by mouth every 8 (eight) hours.   Align 4 MG Caps Take 4 mg by mouth daily.   amiodarone 200 MG tablet Commonly known as: PACERONE TAKE ONE TABLET BY MOUTH DAILY   B-complex with vitamin C tablet Take 1 tablet by mouth  daily.   furosemide 40 MG tablet Commonly known as: LASIX TAKE 1 TABLET ONCE DAILY.   ipratropium 0.03 % nasal spray Commonly known as: ATROVENT Place 2 sprays into both nostrils 2 (two) times daily.   levothyroxine 100 MCG tablet Commonly known as: SYNTHROID Take 100 mcg by mouth every morning.   lidocaine 5 % Commonly known as: LIDODERM Place 1 patch onto the  skin in the morning and at bedtime. Remove & Discard patch within 12 hours or as directed by MD   loperamide 2 MG tablet Commonly known as: IMODIUM A-D Take 2 mg by mouth daily as needed for diarrhea or loose stools.   melatonin 5 MG Tabs Take 5 mg by mouth at bedtime as needed.   metoprolol tartrate 25 MG tablet Commonly known as: LOPRESSOR Take 12.5 mg by mouth 2 (two) times daily.   nitroGLYCERIN 0.4 MG SL tablet Commonly known as: NITROSTAT Place 1 tablet (0.4 mg total) under the tongue every 5 (five) minutes as needed for chest pain.   ondansetron 4 MG tablet Commonly known as: ZOFRAN Take 4 mg by mouth every 6 (six) hours as needed for nausea or vomiting.   pantoprazole 40 MG tablet Commonly known as: PROTONIX Take 1 tablet (40 mg total) by mouth daily.   polyethylene glycol powder 17 GM/SCOOP powder Commonly known as: GLYCOLAX/MIRALAX Take 17 g by mouth daily.   sennosides-docusate sodium 8.6-50 MG tablet Commonly known as: SENOKOT-S Take 1 tablet by mouth at bedtime as needed for constipation.   traMADol 50 MG tablet Commonly known as: ULTRAM Take 0.5 tablets (25 mg total) by mouth every 8 (eight) hours as needed for up to 5 days.   Vitamin D3 50 MCG (2000 UT) Tabs Take 2,000 Units by mouth daily.        Review of Systems  Constitutional:  Positive for activity change.  HENT: Negative.    Respiratory: Negative.    Cardiovascular: Negative.   Gastrointestinal: Negative.   Genitourinary: Negative.   Musculoskeletal:  Positive for arthralgias, back pain and gait problem.  Skin: Negative.    Neurological:  Positive for weakness.  Psychiatric/Behavioral:  Positive for dysphoric mood.    Vitals:   01/24/21 0958  BP: (!) 149/83  Pulse: 68  Resp: 18  Temp: 98.1 F (36.7 C)  SpO2: 95%  Weight: 162 lb (73.5 kg)  Height: 5' 3.8" (1.621 m)   Body mass index is 27.98 kg/m. Physical Exam Constitutional: Oriented to person, place, and time. Well-developed and well-nourished.  HENT:  Head: Normocephalic.  Mouth/Throat: Oropharynx is clear and moist.  Eyes: Pupils are equal, round, and reactive to light.  Neck: Neck supple.  Cardiovascular: Normal rate and normal heart sounds.  No murmur heard. Pulmonary/Chest: Effort normal and breath sounds normal. No respiratory distress. No wheezes. She has no rales.  Abdominal: Soft. Bowel sounds are normal. No distension. There is no tenderness. There is no rebound.  Musculoskeletal: No edema. Tender in Lumbar area and Lower back  Lymphadenopathy: none Neurological: Alert and oriented to person, place, and time.  Skin: Skin is warm and dry.  Psychiatric: Normal mood and affect. Behavior is normal. Thought content normal.   Labs reviewed: Basic Metabolic Panel: Recent Labs    09/21/20 1358  NA 135  K 4.3  CL 94*  CO2 23  GLUCOSE 77  BUN 30  CREATININE 1.65*  CALCIUM 9.4   Liver Function Tests: Recent Labs    09/21/20 1358  AST 31  ALT 18  ALKPHOS 103  BILITOT 0.5  PROT 6.6  ALBUMIN 4.2   No results for input(s): LIPASE, AMYLASE in the last 8760 hours. No results for input(s): AMMONIA in the last 8760 hours. CBC: Recent Labs    09/21/20 1358  WBC 7.0  HGB 13.2  HCT 39.6  MCV 98*  PLT 172   Cardiac Enzymes: No results for input(s): CKTOTAL, CKMB, CKMBINDEX,  TROPONINI in the last 8760 hours. BNP: Invalid input(s): POCBNP No results found for: HGBA1C Lab Results  Component Value Date   TSH 3.280 09/21/2020   No results found for: VITAMINB12 No results found for: FOLATE No results found for: IRON,  TIBC, FERRITIN  Imaging and Procedures obtained prior to SNF admission: DG Lumbar Spine 2-3 Views  Result Date: 01/17/2021 CLINICAL DATA:  Fall, initial encounter EXAM: LUMBAR SPINE - 2-3 VIEW COMPARISON:  None FINDINGS: Five non-rib-bearing lumbar vertebra. Diffuse osseous demineralization. Prior vertebroplasties of L2 and L3. Pronounced dextroconvex thoracolumbar scoliosis. Multilevel disc space narrowing and facet degenerative changes. No fracture, subluxation, or bone destruction. SI joints preserved. Atherosclerotic calcifications aorta. IMPRESSION: Osseous demineralization with multilevel degenerative disc and facet disease changes of lumbar spine with dextroconvex thoracolumbar scoliosis. Prior spinal augmentation procedures of compression fractures at L2 and L3. No acute abnormalities. Aortic Atherosclerosis (ICD10-I70.0). Electronically Signed   By: Lavonia Dana M.D.   On: 01/17/2021 15:23   DG Sacrum/Coccyx  Result Date: 01/17/2021 CLINICAL DATA:  Fall, initial encounter EXAM: SACRUM AND COCCYX - 2+ VIEW COMPARISON:  None FINDINGS: Minimal narrowing of the hip joints. SI joints preserved. Degenerative disc and facet disease changes at visualized lower lumbar spine. No definite sacrococcygeal fracture identified. IMPRESSION: No definite sacrococcygeal fracture identified. Electronically Signed   By: Lavonia Dana M.D.   On: 01/17/2021 15:24    Assessment/Plan Strain of lumbar region, initial encounter Start on Medrol Pack Already on Tylenol and Tramadol and Will also start Robaxin 250 mg BID pRn Will work with therapy If Pain not better ? Reimaging Primary hypertension On Low dose of Metoprolol Chronic diastolic heart failure (HCC) Continue on Lasix  Atrial fibrillation, unspecified type (HCC) On Amiodarone and Lopressor  Not on any Anticoagulation due to GI bleeding  Hypothyroidism, unspecified type Followed by her PCP  Stage 3b chronic kidney disease ([place]) Will try to avoid  NSAIDS for now   Family/ staff Communication:   Labs/tests ordered:

## 2021-01-25 DIAGNOSIS — R2681 Unsteadiness on feet: Secondary | ICD-10-CM | POA: Diagnosis not present

## 2021-01-25 DIAGNOSIS — R278 Other lack of coordination: Secondary | ICD-10-CM | POA: Diagnosis not present

## 2021-01-25 DIAGNOSIS — R54 Age-related physical debility: Secondary | ICD-10-CM | POA: Diagnosis not present

## 2021-01-25 DIAGNOSIS — M6389 Disorders of muscle in diseases classified elsewhere, multiple sites: Secondary | ICD-10-CM | POA: Diagnosis not present

## 2021-01-25 DIAGNOSIS — Z9181 History of falling: Secondary | ICD-10-CM | POA: Diagnosis not present

## 2021-01-25 DIAGNOSIS — R2689 Other abnormalities of gait and mobility: Secondary | ICD-10-CM | POA: Diagnosis not present

## 2021-01-25 DIAGNOSIS — M5459 Other low back pain: Secondary | ICD-10-CM | POA: Diagnosis not present

## 2021-01-26 ENCOUNTER — Other Ambulatory Visit: Payer: Self-pay | Admitting: Orthopedic Surgery

## 2021-01-26 DIAGNOSIS — S39012A Strain of muscle, fascia and tendon of lower back, initial encounter: Secondary | ICD-10-CM

## 2021-01-26 DIAGNOSIS — M6389 Disorders of muscle in diseases classified elsewhere, multiple sites: Secondary | ICD-10-CM | POA: Diagnosis not present

## 2021-01-26 DIAGNOSIS — R2681 Unsteadiness on feet: Secondary | ICD-10-CM | POA: Diagnosis not present

## 2021-01-26 DIAGNOSIS — R2689 Other abnormalities of gait and mobility: Secondary | ICD-10-CM | POA: Diagnosis not present

## 2021-01-26 DIAGNOSIS — Z9181 History of falling: Secondary | ICD-10-CM | POA: Diagnosis not present

## 2021-01-26 DIAGNOSIS — M5459 Other low back pain: Secondary | ICD-10-CM | POA: Diagnosis not present

## 2021-01-26 DIAGNOSIS — R278 Other lack of coordination: Secondary | ICD-10-CM | POA: Diagnosis not present

## 2021-01-26 DIAGNOSIS — R54 Age-related physical debility: Secondary | ICD-10-CM | POA: Diagnosis not present

## 2021-01-26 MED ORDER — TRAMADOL HCL 50 MG PO TABS
25.0000 mg | ORAL_TABLET | Freq: Four times a day (QID) | ORAL | 0 refills | Status: AC | PRN
Start: 2021-01-26 — End: 2021-02-02

## 2021-01-27 DIAGNOSIS — R278 Other lack of coordination: Secondary | ICD-10-CM | POA: Diagnosis not present

## 2021-01-27 DIAGNOSIS — M6389 Disorders of muscle in diseases classified elsewhere, multiple sites: Secondary | ICD-10-CM | POA: Diagnosis not present

## 2021-01-27 DIAGNOSIS — R2681 Unsteadiness on feet: Secondary | ICD-10-CM | POA: Diagnosis not present

## 2021-01-27 DIAGNOSIS — R2689 Other abnormalities of gait and mobility: Secondary | ICD-10-CM | POA: Diagnosis not present

## 2021-01-27 DIAGNOSIS — R54 Age-related physical debility: Secondary | ICD-10-CM | POA: Diagnosis not present

## 2021-01-27 DIAGNOSIS — M5459 Other low back pain: Secondary | ICD-10-CM | POA: Diagnosis not present

## 2021-01-27 DIAGNOSIS — Z9181 History of falling: Secondary | ICD-10-CM | POA: Diagnosis not present

## 2021-01-28 DIAGNOSIS — R54 Age-related physical debility: Secondary | ICD-10-CM | POA: Diagnosis not present

## 2021-01-28 DIAGNOSIS — Z9181 History of falling: Secondary | ICD-10-CM | POA: Diagnosis not present

## 2021-01-28 DIAGNOSIS — M6389 Disorders of muscle in diseases classified elsewhere, multiple sites: Secondary | ICD-10-CM | POA: Diagnosis not present

## 2021-01-28 DIAGNOSIS — R278 Other lack of coordination: Secondary | ICD-10-CM | POA: Diagnosis not present

## 2021-01-31 DIAGNOSIS — M6389 Disorders of muscle in diseases classified elsewhere, multiple sites: Secondary | ICD-10-CM | POA: Diagnosis not present

## 2021-01-31 DIAGNOSIS — R2689 Other abnormalities of gait and mobility: Secondary | ICD-10-CM | POA: Diagnosis not present

## 2021-01-31 DIAGNOSIS — R278 Other lack of coordination: Secondary | ICD-10-CM | POA: Diagnosis not present

## 2021-01-31 DIAGNOSIS — M5459 Other low back pain: Secondary | ICD-10-CM | POA: Diagnosis not present

## 2021-01-31 DIAGNOSIS — R2681 Unsteadiness on feet: Secondary | ICD-10-CM | POA: Diagnosis not present

## 2021-01-31 DIAGNOSIS — R54 Age-related physical debility: Secondary | ICD-10-CM | POA: Diagnosis not present

## 2021-01-31 DIAGNOSIS — Z9181 History of falling: Secondary | ICD-10-CM | POA: Diagnosis not present

## 2021-02-01 DIAGNOSIS — R278 Other lack of coordination: Secondary | ICD-10-CM | POA: Diagnosis not present

## 2021-02-01 DIAGNOSIS — M6389 Disorders of muscle in diseases classified elsewhere, multiple sites: Secondary | ICD-10-CM | POA: Diagnosis not present

## 2021-02-01 DIAGNOSIS — R2689 Other abnormalities of gait and mobility: Secondary | ICD-10-CM | POA: Diagnosis not present

## 2021-02-01 DIAGNOSIS — Z9181 History of falling: Secondary | ICD-10-CM | POA: Diagnosis not present

## 2021-02-01 DIAGNOSIS — M5459 Other low back pain: Secondary | ICD-10-CM | POA: Diagnosis not present

## 2021-02-01 DIAGNOSIS — R2681 Unsteadiness on feet: Secondary | ICD-10-CM | POA: Diagnosis not present

## 2021-02-01 DIAGNOSIS — R54 Age-related physical debility: Secondary | ICD-10-CM | POA: Diagnosis not present

## 2021-02-02 DIAGNOSIS — R2689 Other abnormalities of gait and mobility: Secondary | ICD-10-CM | POA: Diagnosis not present

## 2021-02-02 DIAGNOSIS — Z9181 History of falling: Secondary | ICD-10-CM | POA: Diagnosis not present

## 2021-02-02 DIAGNOSIS — R278 Other lack of coordination: Secondary | ICD-10-CM | POA: Diagnosis not present

## 2021-02-02 DIAGNOSIS — M6389 Disorders of muscle in diseases classified elsewhere, multiple sites: Secondary | ICD-10-CM | POA: Diagnosis not present

## 2021-02-02 DIAGNOSIS — R2681 Unsteadiness on feet: Secondary | ICD-10-CM | POA: Diagnosis not present

## 2021-02-02 DIAGNOSIS — R54 Age-related physical debility: Secondary | ICD-10-CM | POA: Diagnosis not present

## 2021-02-02 DIAGNOSIS — M5459 Other low back pain: Secondary | ICD-10-CM | POA: Diagnosis not present

## 2021-02-03 DIAGNOSIS — Z9181 History of falling: Secondary | ICD-10-CM | POA: Diagnosis not present

## 2021-02-03 DIAGNOSIS — R278 Other lack of coordination: Secondary | ICD-10-CM | POA: Diagnosis not present

## 2021-02-03 DIAGNOSIS — R54 Age-related physical debility: Secondary | ICD-10-CM | POA: Diagnosis not present

## 2021-02-03 DIAGNOSIS — R2681 Unsteadiness on feet: Secondary | ICD-10-CM | POA: Diagnosis not present

## 2021-02-03 DIAGNOSIS — R2689 Other abnormalities of gait and mobility: Secondary | ICD-10-CM | POA: Diagnosis not present

## 2021-02-03 DIAGNOSIS — M5459 Other low back pain: Secondary | ICD-10-CM | POA: Diagnosis not present

## 2021-02-03 DIAGNOSIS — M6389 Disorders of muscle in diseases classified elsewhere, multiple sites: Secondary | ICD-10-CM | POA: Diagnosis not present

## 2021-02-04 DIAGNOSIS — R2689 Other abnormalities of gait and mobility: Secondary | ICD-10-CM | POA: Diagnosis not present

## 2021-02-04 DIAGNOSIS — R54 Age-related physical debility: Secondary | ICD-10-CM | POA: Diagnosis not present

## 2021-02-04 DIAGNOSIS — Z9181 History of falling: Secondary | ICD-10-CM | POA: Diagnosis not present

## 2021-02-04 DIAGNOSIS — M6389 Disorders of muscle in diseases classified elsewhere, multiple sites: Secondary | ICD-10-CM | POA: Diagnosis not present

## 2021-02-04 DIAGNOSIS — M5459 Other low back pain: Secondary | ICD-10-CM | POA: Diagnosis not present

## 2021-02-04 DIAGNOSIS — R278 Other lack of coordination: Secondary | ICD-10-CM | POA: Diagnosis not present

## 2021-02-04 DIAGNOSIS — R2681 Unsteadiness on feet: Secondary | ICD-10-CM | POA: Diagnosis not present

## 2021-02-07 DIAGNOSIS — R278 Other lack of coordination: Secondary | ICD-10-CM | POA: Diagnosis not present

## 2021-02-07 DIAGNOSIS — M6389 Disorders of muscle in diseases classified elsewhere, multiple sites: Secondary | ICD-10-CM | POA: Diagnosis not present

## 2021-02-07 DIAGNOSIS — R54 Age-related physical debility: Secondary | ICD-10-CM | POA: Diagnosis not present

## 2021-02-07 DIAGNOSIS — Z9181 History of falling: Secondary | ICD-10-CM | POA: Diagnosis not present

## 2021-02-08 DIAGNOSIS — M5459 Other low back pain: Secondary | ICD-10-CM | POA: Diagnosis not present

## 2021-02-08 DIAGNOSIS — R2681 Unsteadiness on feet: Secondary | ICD-10-CM | POA: Diagnosis not present

## 2021-02-08 DIAGNOSIS — R278 Other lack of coordination: Secondary | ICD-10-CM | POA: Diagnosis not present

## 2021-02-08 DIAGNOSIS — Z9181 History of falling: Secondary | ICD-10-CM | POA: Diagnosis not present

## 2021-02-08 DIAGNOSIS — R2689 Other abnormalities of gait and mobility: Secondary | ICD-10-CM | POA: Diagnosis not present

## 2021-02-08 DIAGNOSIS — R54 Age-related physical debility: Secondary | ICD-10-CM | POA: Diagnosis not present

## 2021-02-08 DIAGNOSIS — M6389 Disorders of muscle in diseases classified elsewhere, multiple sites: Secondary | ICD-10-CM | POA: Diagnosis not present

## 2021-02-09 DIAGNOSIS — M6389 Disorders of muscle in diseases classified elsewhere, multiple sites: Secondary | ICD-10-CM | POA: Diagnosis not present

## 2021-02-09 DIAGNOSIS — Z9181 History of falling: Secondary | ICD-10-CM | POA: Diagnosis not present

## 2021-02-09 DIAGNOSIS — M5459 Other low back pain: Secondary | ICD-10-CM | POA: Diagnosis not present

## 2021-02-09 DIAGNOSIS — R2681 Unsteadiness on feet: Secondary | ICD-10-CM | POA: Diagnosis not present

## 2021-02-09 DIAGNOSIS — R278 Other lack of coordination: Secondary | ICD-10-CM | POA: Diagnosis not present

## 2021-02-09 DIAGNOSIS — R54 Age-related physical debility: Secondary | ICD-10-CM | POA: Diagnosis not present

## 2021-02-09 DIAGNOSIS — R2689 Other abnormalities of gait and mobility: Secondary | ICD-10-CM | POA: Diagnosis not present

## 2021-02-10 DIAGNOSIS — Z9181 History of falling: Secondary | ICD-10-CM | POA: Diagnosis not present

## 2021-02-10 DIAGNOSIS — M5459 Other low back pain: Secondary | ICD-10-CM | POA: Diagnosis not present

## 2021-02-10 DIAGNOSIS — R2689 Other abnormalities of gait and mobility: Secondary | ICD-10-CM | POA: Diagnosis not present

## 2021-02-10 DIAGNOSIS — R54 Age-related physical debility: Secondary | ICD-10-CM | POA: Diagnosis not present

## 2021-02-10 DIAGNOSIS — R2681 Unsteadiness on feet: Secondary | ICD-10-CM | POA: Diagnosis not present

## 2021-02-11 DIAGNOSIS — R2681 Unsteadiness on feet: Secondary | ICD-10-CM | POA: Diagnosis not present

## 2021-02-11 DIAGNOSIS — Z9181 History of falling: Secondary | ICD-10-CM | POA: Diagnosis not present

## 2021-02-11 DIAGNOSIS — R2689 Other abnormalities of gait and mobility: Secondary | ICD-10-CM | POA: Diagnosis not present

## 2021-02-11 DIAGNOSIS — R54 Age-related physical debility: Secondary | ICD-10-CM | POA: Diagnosis not present

## 2021-02-11 DIAGNOSIS — M5459 Other low back pain: Secondary | ICD-10-CM | POA: Diagnosis not present

## 2021-02-11 DIAGNOSIS — M6389 Disorders of muscle in diseases classified elsewhere, multiple sites: Secondary | ICD-10-CM | POA: Diagnosis not present

## 2021-02-11 DIAGNOSIS — R278 Other lack of coordination: Secondary | ICD-10-CM | POA: Diagnosis not present

## 2021-02-14 DIAGNOSIS — R2689 Other abnormalities of gait and mobility: Secondary | ICD-10-CM | POA: Diagnosis not present

## 2021-02-14 DIAGNOSIS — R2681 Unsteadiness on feet: Secondary | ICD-10-CM | POA: Diagnosis not present

## 2021-02-14 DIAGNOSIS — R278 Other lack of coordination: Secondary | ICD-10-CM | POA: Diagnosis not present

## 2021-02-14 DIAGNOSIS — M5459 Other low back pain: Secondary | ICD-10-CM | POA: Diagnosis not present

## 2021-02-14 DIAGNOSIS — M6389 Disorders of muscle in diseases classified elsewhere, multiple sites: Secondary | ICD-10-CM | POA: Diagnosis not present

## 2021-02-14 DIAGNOSIS — Z9181 History of falling: Secondary | ICD-10-CM | POA: Diagnosis not present

## 2021-02-14 DIAGNOSIS — R54 Age-related physical debility: Secondary | ICD-10-CM | POA: Diagnosis not present

## 2021-02-15 DIAGNOSIS — R278 Other lack of coordination: Secondary | ICD-10-CM | POA: Diagnosis not present

## 2021-02-15 DIAGNOSIS — R2681 Unsteadiness on feet: Secondary | ICD-10-CM | POA: Diagnosis not present

## 2021-02-15 DIAGNOSIS — Z9181 History of falling: Secondary | ICD-10-CM | POA: Diagnosis not present

## 2021-02-15 DIAGNOSIS — R2689 Other abnormalities of gait and mobility: Secondary | ICD-10-CM | POA: Diagnosis not present

## 2021-02-15 DIAGNOSIS — R54 Age-related physical debility: Secondary | ICD-10-CM | POA: Diagnosis not present

## 2021-02-15 DIAGNOSIS — M5459 Other low back pain: Secondary | ICD-10-CM | POA: Diagnosis not present

## 2021-02-15 DIAGNOSIS — M6389 Disorders of muscle in diseases classified elsewhere, multiple sites: Secondary | ICD-10-CM | POA: Diagnosis not present

## 2021-02-16 DIAGNOSIS — Z9181 History of falling: Secondary | ICD-10-CM | POA: Diagnosis not present

## 2021-02-16 DIAGNOSIS — R2689 Other abnormalities of gait and mobility: Secondary | ICD-10-CM | POA: Diagnosis not present

## 2021-02-16 DIAGNOSIS — R54 Age-related physical debility: Secondary | ICD-10-CM | POA: Diagnosis not present

## 2021-02-16 DIAGNOSIS — M5459 Other low back pain: Secondary | ICD-10-CM | POA: Diagnosis not present

## 2021-02-16 DIAGNOSIS — R2681 Unsteadiness on feet: Secondary | ICD-10-CM | POA: Diagnosis not present

## 2021-02-16 DIAGNOSIS — M6389 Disorders of muscle in diseases classified elsewhere, multiple sites: Secondary | ICD-10-CM | POA: Diagnosis not present

## 2021-02-16 DIAGNOSIS — R278 Other lack of coordination: Secondary | ICD-10-CM | POA: Diagnosis not present

## 2021-02-17 DIAGNOSIS — R2681 Unsteadiness on feet: Secondary | ICD-10-CM | POA: Diagnosis not present

## 2021-02-17 DIAGNOSIS — R2689 Other abnormalities of gait and mobility: Secondary | ICD-10-CM | POA: Diagnosis not present

## 2021-02-17 DIAGNOSIS — Z9181 History of falling: Secondary | ICD-10-CM | POA: Diagnosis not present

## 2021-02-17 DIAGNOSIS — R54 Age-related physical debility: Secondary | ICD-10-CM | POA: Diagnosis not present

## 2021-02-17 DIAGNOSIS — R278 Other lack of coordination: Secondary | ICD-10-CM | POA: Diagnosis not present

## 2021-02-17 DIAGNOSIS — M6389 Disorders of muscle in diseases classified elsewhere, multiple sites: Secondary | ICD-10-CM | POA: Diagnosis not present

## 2021-02-17 DIAGNOSIS — M5459 Other low back pain: Secondary | ICD-10-CM | POA: Diagnosis not present

## 2021-02-18 DIAGNOSIS — R54 Age-related physical debility: Secondary | ICD-10-CM | POA: Diagnosis not present

## 2021-02-18 DIAGNOSIS — Z9181 History of falling: Secondary | ICD-10-CM | POA: Diagnosis not present

## 2021-02-18 DIAGNOSIS — R278 Other lack of coordination: Secondary | ICD-10-CM | POA: Diagnosis not present

## 2021-02-18 DIAGNOSIS — M6389 Disorders of muscle in diseases classified elsewhere, multiple sites: Secondary | ICD-10-CM | POA: Diagnosis not present

## 2021-02-21 DIAGNOSIS — R278 Other lack of coordination: Secondary | ICD-10-CM | POA: Diagnosis not present

## 2021-02-21 DIAGNOSIS — R54 Age-related physical debility: Secondary | ICD-10-CM | POA: Diagnosis not present

## 2021-02-21 DIAGNOSIS — M5459 Other low back pain: Secondary | ICD-10-CM | POA: Diagnosis not present

## 2021-02-21 DIAGNOSIS — Z9181 History of falling: Secondary | ICD-10-CM | POA: Diagnosis not present

## 2021-02-21 DIAGNOSIS — M6389 Disorders of muscle in diseases classified elsewhere, multiple sites: Secondary | ICD-10-CM | POA: Diagnosis not present

## 2021-02-21 DIAGNOSIS — R2689 Other abnormalities of gait and mobility: Secondary | ICD-10-CM | POA: Diagnosis not present

## 2021-02-21 DIAGNOSIS — R2681 Unsteadiness on feet: Secondary | ICD-10-CM | POA: Diagnosis not present

## 2021-02-22 ENCOUNTER — Non-Acute Institutional Stay (SKILLED_NURSING_FACILITY): Payer: Medicare Other | Admitting: Internal Medicine

## 2021-02-22 DIAGNOSIS — Z9181 History of falling: Secondary | ICD-10-CM | POA: Diagnosis not present

## 2021-02-22 DIAGNOSIS — R278 Other lack of coordination: Secondary | ICD-10-CM | POA: Diagnosis not present

## 2021-02-22 DIAGNOSIS — N1832 Chronic kidney disease, stage 3b: Secondary | ICD-10-CM | POA: Diagnosis not present

## 2021-02-22 DIAGNOSIS — I10 Essential (primary) hypertension: Secondary | ICD-10-CM

## 2021-02-22 DIAGNOSIS — I4891 Unspecified atrial fibrillation: Secondary | ICD-10-CM

## 2021-02-22 DIAGNOSIS — M6389 Disorders of muscle in diseases classified elsewhere, multiple sites: Secondary | ICD-10-CM | POA: Diagnosis not present

## 2021-02-22 DIAGNOSIS — E039 Hypothyroidism, unspecified: Secondary | ICD-10-CM

## 2021-02-22 DIAGNOSIS — I5032 Chronic diastolic (congestive) heart failure: Secondary | ICD-10-CM

## 2021-02-22 DIAGNOSIS — R54 Age-related physical debility: Secondary | ICD-10-CM | POA: Diagnosis not present

## 2021-02-22 DIAGNOSIS — S39012A Strain of muscle, fascia and tendon of lower back, initial encounter: Secondary | ICD-10-CM | POA: Diagnosis not present

## 2021-02-23 DIAGNOSIS — R2681 Unsteadiness on feet: Secondary | ICD-10-CM | POA: Diagnosis not present

## 2021-02-23 DIAGNOSIS — R54 Age-related physical debility: Secondary | ICD-10-CM | POA: Diagnosis not present

## 2021-02-23 DIAGNOSIS — M5459 Other low back pain: Secondary | ICD-10-CM | POA: Diagnosis not present

## 2021-02-23 DIAGNOSIS — R278 Other lack of coordination: Secondary | ICD-10-CM | POA: Diagnosis not present

## 2021-02-23 DIAGNOSIS — M6389 Disorders of muscle in diseases classified elsewhere, multiple sites: Secondary | ICD-10-CM | POA: Diagnosis not present

## 2021-02-23 DIAGNOSIS — Z9181 History of falling: Secondary | ICD-10-CM | POA: Diagnosis not present

## 2021-02-23 DIAGNOSIS — R2689 Other abnormalities of gait and mobility: Secondary | ICD-10-CM | POA: Diagnosis not present

## 2021-02-24 DIAGNOSIS — M6389 Disorders of muscle in diseases classified elsewhere, multiple sites: Secondary | ICD-10-CM | POA: Diagnosis not present

## 2021-02-24 DIAGNOSIS — R2689 Other abnormalities of gait and mobility: Secondary | ICD-10-CM | POA: Diagnosis not present

## 2021-02-24 DIAGNOSIS — R278 Other lack of coordination: Secondary | ICD-10-CM | POA: Diagnosis not present

## 2021-02-24 DIAGNOSIS — M5459 Other low back pain: Secondary | ICD-10-CM | POA: Diagnosis not present

## 2021-02-24 DIAGNOSIS — R54 Age-related physical debility: Secondary | ICD-10-CM | POA: Diagnosis not present

## 2021-02-24 DIAGNOSIS — Z9181 History of falling: Secondary | ICD-10-CM | POA: Diagnosis not present

## 2021-02-24 DIAGNOSIS — R2681 Unsteadiness on feet: Secondary | ICD-10-CM | POA: Diagnosis not present

## 2021-02-25 ENCOUNTER — Encounter: Payer: Self-pay | Admitting: Internal Medicine

## 2021-02-25 DIAGNOSIS — Z9181 History of falling: Secondary | ICD-10-CM | POA: Diagnosis not present

## 2021-02-25 DIAGNOSIS — R278 Other lack of coordination: Secondary | ICD-10-CM | POA: Diagnosis not present

## 2021-02-25 DIAGNOSIS — R54 Age-related physical debility: Secondary | ICD-10-CM | POA: Diagnosis not present

## 2021-02-25 DIAGNOSIS — M6389 Disorders of muscle in diseases classified elsewhere, multiple sites: Secondary | ICD-10-CM | POA: Diagnosis not present

## 2021-02-25 NOTE — Progress Notes (Signed)
Location:   [facility] of Service:  SNF (31)  Provider: Veleta Miners MD  PCP: Lawerance Cruel, MD Patient Care Team: Lawerance Cruel, MD as PCP - General (Family Medicine) Jerline Pain, MD as PCP - Cardiology (Cardiology)  Extended Emergency Contact Information Primary Emergency Contact: Aris Georgia Address: [address]          [place], [place] [address] Johnnette Litter of [place] Phone: [phone] Mobile Phone: [phone] Relation: Spouse Secondary Emergency Contact: Arna Snipe Address: [address] [facility] [place] [address] Johnnette Litter of [place] Phone: [phone] Work Phone: [phone] Mobile Phone: [phone] Relation: Daughter  Code Status: DNR Goals of care:  Advanced Directive information Advanced Directives 02/25/2021  Does Patient Have a Medical Advance Directive? Yes  Type of Paramedic of [place];Living will  Does patient want to make changes to medical advance directive? No - Patient declined  Copy of [place] in Chart? Yes - validated most recent copy scanned in chart (See row information)  Would patient like information on creating a medical advance directive? -     Allergies  Allergen Reactions   Tape Other (See Comments)    Patient prefers easy-release tape    Chief Complaint  Patient presents with   Discharge Note    HPI:  87 y.o. female  seen for discharge from SNF to her [place] apartment with her husband and Home Nurses to help with ADLS  Patient has h/o Breast Cancer CKD, CHF Compression Fractures in the past , HTN, Hypothyroid, PAF,    Patient fell in her apartment and since then was  been lower Back pain Was seen by Dr Harrington Challenger and had Xray done of lower back which did not  show any Fractures Transferred  to SNF on 08/19 as unable to take care of herself in her apartment Started on medrol pack, Tylenol and Robaxin Also  on Low dose of Tramadol She also Received therapy  She still needs some helps with her ADLS like showering  But her husband wants to hire help He also had some questions on her Pain control and how to uses PRN Tramadol and Robaxin Patient very alert and says she will only use Tylenol unless she needs to take PRNS to sleep at night or in the morning  Other issues stable Plans to follow with Dr Harrington Challenger  Past Medical History:  Diagnosis Date   Actinic keratoses    Arthritis    back    Breast cancer ([place])    Breast cancer (right)   Chronic diastolic CHF (congestive heart failure) ([place])    CKD (chronic kidney disease), stage III ([place])    Compression fracture of L3 lumbar vertebra    Dysphagia    GERD (gastroesophageal reflux disease)    takes otc Pepcid as needed   Hiatal hernia 11/27/2014   HTN (hypertension)    Hypothyroidism    Insomnia    Osteopenia    PAF (paroxysmal atrial fibrillation) ([place])    a. questionable episode in 2015 in [facility]. b. event monitor through December/January 2014/2015 which showed no evidence of atrial fibrillation. c. Dx 12/2018.   PAT (paroxysmal atrial tachycardia) (HCC)    Pleural effusion     Past Surgical History:  Procedure Laterality Date   BREAST LUMPECTOMY Right 1995   lumpectomy   COLONOSCOPY     ESOPHAGOGASTRODUODENOSCOPY (EGD) WITH PROPOFOL Left 01/09/2019  Procedure: ESOPHAGOGASTRODUODENOSCOPY (EGD) WITH PROPOFOL;  Surgeon: Carol Ada, MD;  [facility];  Service: Endoscopy;  Laterality: Left;   EYE SURGERY Bilateral    cataract w/ lens implant   KYPHOPLASTY N/A 09/18/2013   Procedure: KYPHOPLASTY;  Surgeon: Sinclair Ship, MD;  Location: [facility];  Service: Orthopedics;  Laterality: N/A;  Lumbar 3 kyphoplasty   TONSILLECTOMY        reports that she has quit smoking. She has never used smokeless tobacco. She reports current alcohol use of about 7.0 standard drinks per week. She reports that she does not use drugs. Social  History   Socioeconomic History   Marital status: Married    Spouse name: Not on file   Number of children: Not on file   Years of education: Not on file   Highest education level: Not on file  Occupational History   Not on file  Tobacco Use   Smoking status: Former   Smokeless tobacco: Never   Tobacco comments:    only in college  Vaping Use   Vaping Use: Never used  Substance and Sexual Activity   Alcohol use: Yes    Alcohol/week: 7.0 standard drinks    Types: 7 Glasses of wine per week    Comment: 1 glass of wine a day   Drug use: No   Sexual activity: Not on file  Other Topics Concern   Not on file  Social History Narrative   Not on file   Social Determinants of Health   Financial Resource Strain: Not on file  Food Insecurity: Not on file  Transportation Needs: Not on file  Physical Activity: Not on file  Stress: Not on file  Social Connections: Not on file  Intimate Partner Violence: Not on file   Functional Status Survey:    Allergies  Allergen Reactions   Tape Other (See Comments)    Patient prefers easy-release tape    Pertinent  Health Maintenance Due  Topic Date Due   INFLUENZA VACCINE  Never done   DEXA SCAN  Completed    Medications: Allergies as of 02/22/2021       Reactions   Tape Other (See Comments)   Patient prefers easy-release tape        Medication List        Accurate as of February 22, 2021 11:59 PM. If you have any questions, ask your nurse or doctor.          acetaminophen 500 MG tablet Commonly known as: TYLENOL Take 2 tablets (1,000 mg total) by mouth every 8 (eight) hours.   Align 4 MG Caps Take 4 mg by mouth daily.   amiodarone 200 MG tablet Commonly known as: PACERONE TAKE ONE TABLET BY MOUTH DAILY   B-complex with vitamin C tablet Take 1 tablet by mouth daily.   furosemide 40 MG tablet Commonly known as: LASIX TAKE 1 TABLET ONCE DAILY.   ipratropium 0.03 % nasal spray Commonly known as:  ATROVENT Place 2 sprays into both nostrils 2 (two) times daily.   levothyroxine 100 MCG tablet Commonly known as: SYNTHROID Take 100 mcg by mouth every morning.   lidocaine 5 % Commonly known as: LIDODERM Place 1 patch onto the skin in the morning and at bedtime. Remove & Discard patch within 12 hours or as directed by MD   loperamide 2 MG tablet Commonly known as: IMODIUM A-D Take 2 mg by mouth daily as needed for diarrhea or loose stools.   melatonin 5  MG Tabs Take 5 mg by mouth at bedtime as needed.   methocarbamol 500 MG tablet Commonly known as: ROBAXIN Take 250 mg by mouth 2 (two) times daily as needed for muscle spasms.   metoprolol tartrate 25 MG tablet Commonly known as: LOPRESSOR Take 12.5 mg by mouth 2 (two) times daily.   nitroGLYCERIN 0.4 MG SL tablet Commonly known as: NITROSTAT Place 1 tablet (0.4 mg total) under the tongue every 5 (five) minutes as needed for chest pain.   ondansetron 4 MG tablet Commonly known as: ZOFRAN Take 4 mg by mouth every 6 (six) hours as needed for nausea or vomiting.   pantoprazole 40 MG tablet Commonly known as: PROTONIX Take 1 tablet (40 mg total) by mouth daily.   polyethylene glycol powder 17 GM/SCOOP powder Commonly known as: GLYCOLAX/MIRALAX Take 17 g by mouth daily.   sennosides-docusate sodium 8.6-50 MG tablet Commonly known as: SENOKOT-S Take 1 tablet by mouth at bedtime as needed for constipation.   traMADol 50 MG tablet Commonly known as: ULTRAM Take 25 mg by mouth every 6 (six) hours as needed.   Vitamin D3 50 MCG (2000 UT) Tabs Take 2,000 Units by mouth daily.        Review of Systems  Vitals:   02/25/21 1311  BP: 138/77  Pulse: (!) 59  Resp: 20  Temp: 97.9 F (36.6 C)  SpO2: 92%  Weight: 147 lb 12.8 oz (67 kg)  Height: 5' 3.8" (1.621 m)   Body mass index is 25.53 kg/m. Physical Exam Constitutional: Oriented to person, place, and time. Well-developed and well-nourished.  HENT:  Head:  Normocephalic.  Mouth/Throat: Oropharynx is clear and moist.  Eyes: Pupils are equal, round, and reactive to light.  Neck: Neck supple.  Cardiovascular: Normal rate and normal heart sounds.  No murmur heard. Pulmonary/Chest: Effort normal and breath sounds normal. No respiratory distress. No wheezes. She has no rales.  Abdominal: Soft. Bowel sounds are normal. No distension. There is no tenderness. There is no rebound.  Musculoskeletal: No edema.  Lymphadenopathy: none Neurological: Alert and oriented to person, place, and time. Can stand with her walker and Walk independent    Skin: Skin is warm and dry.  Psychiatric: Normal mood and affect. Behavior is normal. Thought content normal.   Labs reviewed: Basic Metabolic Panel: Recent Labs    09/21/20 1358 01/21/21 0000  NA 135 136*  K 4.3 4.1  CL 94* 96*  CO2 23 25*  GLUCOSE 77  --   BUN 30 30*  CREATININE 1.65* 1.9*  CALCIUM 9.4 9.4   Liver Function Tests: Recent Labs    09/21/20 1358  AST 31  ALT 18  ALKPHOS 103  BILITOT 0.5  PROT 6.6  ALBUMIN 4.2   No results for input(s): LIPASE, AMYLASE in the last 8760 hours. No results for input(s): AMMONIA in the last 8760 hours. CBC: Recent Labs    09/21/20 1358 01/21/21 0000  WBC 7.0 8.9  HGB 13.2 12.7  HCT 39.6 38  MCV 98*  --   PLT 172 224   Cardiac Enzymes: No results for input(s): CKTOTAL, CKMB, CKMBINDEX, TROPONINI in the last 8760 hours. BNP: Invalid input(s): POCBNP CBG: No results for input(s): GLUCAP in the last 8760 hours.  Procedures and Imaging Studies During Stay: No results found.  Assessment/Plan:   . Strain of lumbar region, initial encounter Control Pain with Tylenol , Low Dose Robaxin  and tramadol PRN Continue to use Walker and Follow with Therapy at home  Follow with PCP in 2 weeks to evaluate Pain  Primary hypertension On Low dose of Metoprolol Chronic diastolic heart failure (HCC) Continue Lasix Atrial fibrillation, unspecified  type (HCC) On Amiodarone Not on any Anticoagulation due to GI bleeding   Hypothyroidism, unspecified type Follows with PCP Stage 3b chronic kidney disease ([place]) Some worsening of her Creat  Will need follow up with her PCP as Outpatient Continue to avoid NSAIDS  Discussed with the Husband He is going to have help for her ADLS Patient has been advised to f/u with their PCP in 1-2 weeks to bring them up to date on their rehab stay.    Future labs/tests needed:  BMP to follow her Creat

## 2021-02-28 DIAGNOSIS — M6389 Disorders of muscle in diseases classified elsewhere, multiple sites: Secondary | ICD-10-CM | POA: Diagnosis not present

## 2021-02-28 DIAGNOSIS — R54 Age-related physical debility: Secondary | ICD-10-CM | POA: Diagnosis not present

## 2021-02-28 DIAGNOSIS — R278 Other lack of coordination: Secondary | ICD-10-CM | POA: Diagnosis not present

## 2021-02-28 DIAGNOSIS — Z9181 History of falling: Secondary | ICD-10-CM | POA: Diagnosis not present

## 2021-03-01 DIAGNOSIS — Z9181 History of falling: Secondary | ICD-10-CM | POA: Diagnosis not present

## 2021-03-01 DIAGNOSIS — R2689 Other abnormalities of gait and mobility: Secondary | ICD-10-CM | POA: Diagnosis not present

## 2021-03-01 DIAGNOSIS — R278 Other lack of coordination: Secondary | ICD-10-CM | POA: Diagnosis not present

## 2021-03-01 DIAGNOSIS — M6389 Disorders of muscle in diseases classified elsewhere, multiple sites: Secondary | ICD-10-CM | POA: Diagnosis not present

## 2021-03-01 DIAGNOSIS — R2681 Unsteadiness on feet: Secondary | ICD-10-CM | POA: Diagnosis not present

## 2021-03-01 DIAGNOSIS — R54 Age-related physical debility: Secondary | ICD-10-CM | POA: Diagnosis not present

## 2021-03-01 DIAGNOSIS — M5459 Other low back pain: Secondary | ICD-10-CM | POA: Diagnosis not present

## 2021-03-07 DIAGNOSIS — M5489 Other dorsalgia: Secondary | ICD-10-CM | POA: Diagnosis not present

## 2021-03-08 ENCOUNTER — Other Ambulatory Visit (HOSPITAL_COMMUNITY): Payer: Self-pay | Admitting: Family Medicine

## 2021-03-08 ENCOUNTER — Other Ambulatory Visit: Payer: Self-pay | Admitting: Family Medicine

## 2021-03-08 DIAGNOSIS — M6389 Disorders of muscle in diseases classified elsewhere, multiple sites: Secondary | ICD-10-CM | POA: Diagnosis not present

## 2021-03-08 DIAGNOSIS — R278 Other lack of coordination: Secondary | ICD-10-CM | POA: Diagnosis not present

## 2021-03-08 DIAGNOSIS — Z9181 History of falling: Secondary | ICD-10-CM | POA: Diagnosis not present

## 2021-03-08 DIAGNOSIS — R54 Age-related physical debility: Secondary | ICD-10-CM | POA: Diagnosis not present

## 2021-03-08 DIAGNOSIS — M549 Dorsalgia, unspecified: Secondary | ICD-10-CM

## 2021-03-09 DIAGNOSIS — D692 Other nonthrombocytopenic purpura: Secondary | ICD-10-CM | POA: Diagnosis not present

## 2021-03-09 DIAGNOSIS — Z85828 Personal history of other malignant neoplasm of skin: Secondary | ICD-10-CM | POA: Diagnosis not present

## 2021-03-09 DIAGNOSIS — Z85820 Personal history of malignant melanoma of skin: Secondary | ICD-10-CM | POA: Diagnosis not present

## 2021-03-09 DIAGNOSIS — D0439 Carcinoma in situ of skin of other parts of face: Secondary | ICD-10-CM | POA: Diagnosis not present

## 2021-03-09 DIAGNOSIS — D485 Neoplasm of uncertain behavior of skin: Secondary | ICD-10-CM | POA: Diagnosis not present

## 2021-03-09 DIAGNOSIS — L821 Other seborrheic keratosis: Secondary | ICD-10-CM | POA: Diagnosis not present

## 2021-03-09 DIAGNOSIS — D0462 Carcinoma in situ of skin of left upper limb, including shoulder: Secondary | ICD-10-CM | POA: Diagnosis not present

## 2021-03-10 ENCOUNTER — Ambulatory Visit (HOSPITAL_COMMUNITY)
Admission: RE | Admit: 2021-03-10 | Discharge: 2021-03-10 | Disposition: A | Discharge status: Home / Self Care | Payer: Medicare Other | Source: Ambulatory Visit | Attending: Family Medicine | Admitting: Family Medicine

## 2021-03-10 DIAGNOSIS — M549 Dorsalgia, unspecified: Secondary | ICD-10-CM | POA: Insufficient documentation

## 2021-03-10 DIAGNOSIS — M545 Low back pain, unspecified: Secondary | ICD-10-CM | POA: Diagnosis not present

## 2021-03-15 DIAGNOSIS — R54 Age-related physical debility: Secondary | ICD-10-CM | POA: Diagnosis not present

## 2021-03-15 DIAGNOSIS — R2689 Other abnormalities of gait and mobility: Secondary | ICD-10-CM | POA: Diagnosis not present

## 2021-03-15 DIAGNOSIS — Z9181 History of falling: Secondary | ICD-10-CM | POA: Diagnosis not present

## 2021-03-15 DIAGNOSIS — R2681 Unsteadiness on feet: Secondary | ICD-10-CM | POA: Diagnosis not present

## 2021-03-15 DIAGNOSIS — M5459 Other low back pain: Secondary | ICD-10-CM | POA: Diagnosis not present

## 2021-03-16 DIAGNOSIS — R54 Age-related physical debility: Secondary | ICD-10-CM | POA: Diagnosis not present

## 2021-03-16 DIAGNOSIS — M1711 Unilateral primary osteoarthritis, right knee: Secondary | ICD-10-CM | POA: Diagnosis not present

## 2021-03-16 DIAGNOSIS — Z9181 History of falling: Secondary | ICD-10-CM | POA: Diagnosis not present

## 2021-03-16 DIAGNOSIS — M1712 Unilateral primary osteoarthritis, left knee: Secondary | ICD-10-CM | POA: Diagnosis not present

## 2021-03-16 DIAGNOSIS — M5459 Other low back pain: Secondary | ICD-10-CM | POA: Diagnosis not present

## 2021-03-16 DIAGNOSIS — R2689 Other abnormalities of gait and mobility: Secondary | ICD-10-CM | POA: Diagnosis not present

## 2021-03-16 DIAGNOSIS — R2681 Unsteadiness on feet: Secondary | ICD-10-CM | POA: Diagnosis not present

## 2021-03-18 DIAGNOSIS — M545 Low back pain, unspecified: Secondary | ICD-10-CM | POA: Diagnosis not present

## 2021-03-22 DIAGNOSIS — M545 Low back pain, unspecified: Secondary | ICD-10-CM | POA: Diagnosis not present

## 2021-03-22 DIAGNOSIS — Z9181 History of falling: Secondary | ICD-10-CM | POA: Diagnosis not present

## 2021-03-22 DIAGNOSIS — M6389 Disorders of muscle in diseases classified elsewhere, multiple sites: Secondary | ICD-10-CM | POA: Diagnosis not present

## 2021-03-22 DIAGNOSIS — R54 Age-related physical debility: Secondary | ICD-10-CM | POA: Diagnosis not present

## 2021-03-22 DIAGNOSIS — R278 Other lack of coordination: Secondary | ICD-10-CM | POA: Diagnosis not present

## 2021-03-23 DIAGNOSIS — R278 Other lack of coordination: Secondary | ICD-10-CM | POA: Diagnosis not present

## 2021-03-23 DIAGNOSIS — R54 Age-related physical debility: Secondary | ICD-10-CM | POA: Diagnosis not present

## 2021-03-23 DIAGNOSIS — Z9181 History of falling: Secondary | ICD-10-CM | POA: Diagnosis not present

## 2021-03-23 DIAGNOSIS — M6389 Disorders of muscle in diseases classified elsewhere, multiple sites: Secondary | ICD-10-CM | POA: Diagnosis not present

## 2021-03-29 DIAGNOSIS — Z9181 History of falling: Secondary | ICD-10-CM | POA: Diagnosis not present

## 2021-03-29 DIAGNOSIS — R278 Other lack of coordination: Secondary | ICD-10-CM | POA: Diagnosis not present

## 2021-03-29 DIAGNOSIS — M6389 Disorders of muscle in diseases classified elsewhere, multiple sites: Secondary | ICD-10-CM | POA: Diagnosis not present

## 2021-03-29 DIAGNOSIS — R54 Age-related physical debility: Secondary | ICD-10-CM | POA: Diagnosis not present

## 2021-03-31 DIAGNOSIS — Z9181 History of falling: Secondary | ICD-10-CM | POA: Diagnosis not present

## 2021-03-31 DIAGNOSIS — R278 Other lack of coordination: Secondary | ICD-10-CM | POA: Diagnosis not present

## 2021-03-31 DIAGNOSIS — R54 Age-related physical debility: Secondary | ICD-10-CM | POA: Diagnosis not present

## 2021-03-31 DIAGNOSIS — M6389 Disorders of muscle in diseases classified elsewhere, multiple sites: Secondary | ICD-10-CM | POA: Diagnosis not present

## 2021-04-01 DIAGNOSIS — M6389 Disorders of muscle in diseases classified elsewhere, multiple sites: Secondary | ICD-10-CM | POA: Diagnosis not present

## 2021-04-01 DIAGNOSIS — Z9181 History of falling: Secondary | ICD-10-CM | POA: Diagnosis not present

## 2021-04-01 DIAGNOSIS — R54 Age-related physical debility: Secondary | ICD-10-CM | POA: Diagnosis not present

## 2021-04-01 DIAGNOSIS — R278 Other lack of coordination: Secondary | ICD-10-CM | POA: Diagnosis not present

## 2021-04-05 DIAGNOSIS — R54 Age-related physical debility: Secondary | ICD-10-CM | POA: Diagnosis not present

## 2021-04-05 DIAGNOSIS — R278 Other lack of coordination: Secondary | ICD-10-CM | POA: Diagnosis not present

## 2021-04-05 DIAGNOSIS — Z9181 History of falling: Secondary | ICD-10-CM | POA: Diagnosis not present

## 2021-04-05 DIAGNOSIS — M6389 Disorders of muscle in diseases classified elsewhere, multiple sites: Secondary | ICD-10-CM | POA: Diagnosis not present

## 2021-04-07 DIAGNOSIS — R54 Age-related physical debility: Secondary | ICD-10-CM | POA: Diagnosis not present

## 2021-04-07 DIAGNOSIS — M6389 Disorders of muscle in diseases classified elsewhere, multiple sites: Secondary | ICD-10-CM | POA: Diagnosis not present

## 2021-04-07 DIAGNOSIS — R278 Other lack of coordination: Secondary | ICD-10-CM | POA: Diagnosis not present

## 2021-04-07 DIAGNOSIS — Z9181 History of falling: Secondary | ICD-10-CM | POA: Diagnosis not present

## 2021-04-15 DIAGNOSIS — M545 Low back pain, unspecified: Secondary | ICD-10-CM | POA: Diagnosis not present

## 2021-05-13 DIAGNOSIS — M545 Low back pain, unspecified: Secondary | ICD-10-CM | POA: Diagnosis not present

## 2021-05-23 DIAGNOSIS — M5459 Other low back pain: Secondary | ICD-10-CM | POA: Diagnosis not present

## 2021-05-23 DIAGNOSIS — R2689 Other abnormalities of gait and mobility: Secondary | ICD-10-CM | POA: Diagnosis not present

## 2021-05-23 DIAGNOSIS — R293 Abnormal posture: Secondary | ICD-10-CM | POA: Diagnosis not present

## 2021-05-23 DIAGNOSIS — S32000S Wedge compression fracture of unspecified lumbar vertebra, sequela: Secondary | ICD-10-CM | POA: Diagnosis not present

## 2021-05-24 DIAGNOSIS — R293 Abnormal posture: Secondary | ICD-10-CM | POA: Diagnosis not present

## 2021-05-24 DIAGNOSIS — R2689 Other abnormalities of gait and mobility: Secondary | ICD-10-CM | POA: Diagnosis not present

## 2021-05-24 DIAGNOSIS — M5459 Other low back pain: Secondary | ICD-10-CM | POA: Diagnosis not present

## 2021-05-24 DIAGNOSIS — S32000S Wedge compression fracture of unspecified lumbar vertebra, sequela: Secondary | ICD-10-CM | POA: Diagnosis not present

## 2021-05-27 DIAGNOSIS — M5459 Other low back pain: Secondary | ICD-10-CM | POA: Diagnosis not present

## 2021-05-27 DIAGNOSIS — R2689 Other abnormalities of gait and mobility: Secondary | ICD-10-CM | POA: Diagnosis not present

## 2021-05-27 DIAGNOSIS — S32000S Wedge compression fracture of unspecified lumbar vertebra, sequela: Secondary | ICD-10-CM | POA: Diagnosis not present

## 2021-05-27 DIAGNOSIS — R293 Abnormal posture: Secondary | ICD-10-CM | POA: Diagnosis not present

## 2021-06-06 DIAGNOSIS — R2689 Other abnormalities of gait and mobility: Secondary | ICD-10-CM | POA: Diagnosis not present

## 2021-06-06 DIAGNOSIS — R293 Abnormal posture: Secondary | ICD-10-CM | POA: Diagnosis not present

## 2021-06-06 DIAGNOSIS — S32000S Wedge compression fracture of unspecified lumbar vertebra, sequela: Secondary | ICD-10-CM | POA: Diagnosis not present

## 2021-06-06 DIAGNOSIS — M5459 Other low back pain: Secondary | ICD-10-CM | POA: Diagnosis not present

## 2021-06-10 DIAGNOSIS — R2689 Other abnormalities of gait and mobility: Secondary | ICD-10-CM | POA: Diagnosis not present

## 2021-06-10 DIAGNOSIS — S32000S Wedge compression fracture of unspecified lumbar vertebra, sequela: Secondary | ICD-10-CM | POA: Diagnosis not present

## 2021-06-10 DIAGNOSIS — R293 Abnormal posture: Secondary | ICD-10-CM | POA: Diagnosis not present

## 2021-06-10 DIAGNOSIS — M5459 Other low back pain: Secondary | ICD-10-CM | POA: Diagnosis not present

## 2021-06-17 DIAGNOSIS — R2689 Other abnormalities of gait and mobility: Secondary | ICD-10-CM | POA: Diagnosis not present

## 2021-06-17 DIAGNOSIS — S32000S Wedge compression fracture of unspecified lumbar vertebra, sequela: Secondary | ICD-10-CM | POA: Diagnosis not present

## 2021-06-17 DIAGNOSIS — M5459 Other low back pain: Secondary | ICD-10-CM | POA: Diagnosis not present

## 2021-06-17 DIAGNOSIS — R293 Abnormal posture: Secondary | ICD-10-CM | POA: Diagnosis not present

## 2021-06-23 ENCOUNTER — Other Ambulatory Visit: Payer: Self-pay | Admitting: Cardiology

## 2021-06-29 DIAGNOSIS — R293 Abnormal posture: Secondary | ICD-10-CM | POA: Diagnosis not present

## 2021-06-29 DIAGNOSIS — M5459 Other low back pain: Secondary | ICD-10-CM | POA: Diagnosis not present

## 2021-06-29 DIAGNOSIS — S32000S Wedge compression fracture of unspecified lumbar vertebra, sequela: Secondary | ICD-10-CM | POA: Diagnosis not present

## 2021-06-29 DIAGNOSIS — R2689 Other abnormalities of gait and mobility: Secondary | ICD-10-CM | POA: Diagnosis not present

## 2021-07-01 DIAGNOSIS — J45998 Other asthma: Secondary | ICD-10-CM | POA: Diagnosis not present

## 2021-07-01 DIAGNOSIS — K219 Gastro-esophageal reflux disease without esophagitis: Secondary | ICD-10-CM | POA: Diagnosis not present

## 2021-07-01 DIAGNOSIS — I10 Essential (primary) hypertension: Secondary | ICD-10-CM | POA: Diagnosis not present

## 2021-07-01 DIAGNOSIS — E039 Hypothyroidism, unspecified: Secondary | ICD-10-CM | POA: Diagnosis not present

## 2021-07-01 DIAGNOSIS — I4891 Unspecified atrial fibrillation: Secondary | ICD-10-CM | POA: Diagnosis not present

## 2021-07-01 DIAGNOSIS — N184 Chronic kidney disease, stage 4 (severe): Secondary | ICD-10-CM | POA: Diagnosis not present

## 2021-07-19 DIAGNOSIS — U071 COVID-19: Secondary | ICD-10-CM | POA: Diagnosis not present

## 2021-07-29 ENCOUNTER — Other Ambulatory Visit: Payer: Self-pay | Admitting: Orthopedic Surgery

## 2021-07-29 ENCOUNTER — Other Ambulatory Visit: Payer: Self-pay | Admitting: Cardiology

## 2021-07-29 DIAGNOSIS — S22000A Wedge compression fracture of unspecified thoracic vertebra, initial encounter for closed fracture: Secondary | ICD-10-CM

## 2021-08-01 ENCOUNTER — Other Ambulatory Visit (HOSPITAL_COMMUNITY): Payer: Self-pay | Admitting: Orthopedic Surgery

## 2021-08-01 DIAGNOSIS — S22000A Wedge compression fracture of unspecified thoracic vertebra, initial encounter for closed fracture: Secondary | ICD-10-CM

## 2021-08-04 DIAGNOSIS — I4891 Unspecified atrial fibrillation: Secondary | ICD-10-CM | POA: Diagnosis not present

## 2021-08-04 DIAGNOSIS — K219 Gastro-esophageal reflux disease without esophagitis: Secondary | ICD-10-CM | POA: Diagnosis not present

## 2021-08-04 DIAGNOSIS — R609 Edema, unspecified: Secondary | ICD-10-CM | POA: Diagnosis not present

## 2021-08-04 DIAGNOSIS — I509 Heart failure, unspecified: Secondary | ICD-10-CM | POA: Diagnosis not present

## 2021-08-04 DIAGNOSIS — E039 Hypothyroidism, unspecified: Secondary | ICD-10-CM | POA: Diagnosis not present

## 2021-08-04 DIAGNOSIS — E876 Hypokalemia: Secondary | ICD-10-CM | POA: Diagnosis not present

## 2021-08-04 DIAGNOSIS — I10 Essential (primary) hypertension: Secondary | ICD-10-CM | POA: Diagnosis not present

## 2021-08-04 DIAGNOSIS — Z0000 Encounter for general adult medical examination without abnormal findings: Secondary | ICD-10-CM | POA: Diagnosis not present

## 2021-08-06 ENCOUNTER — Ambulatory Visit (HOSPITAL_COMMUNITY)
Admission: RE | Admit: 2021-08-06 | Discharge: 2021-08-06 | Disposition: A | Discharge status: Home / Self Care | Payer: Medicare Other | Source: Ambulatory Visit | Attending: Orthopedic Surgery | Admitting: Orthopedic Surgery

## 2021-08-06 DIAGNOSIS — S22000A Wedge compression fracture of unspecified thoracic vertebra, initial encounter for closed fracture: Secondary | ICD-10-CM

## 2021-08-06 DIAGNOSIS — M48061 Spinal stenosis, lumbar region without neurogenic claudication: Secondary | ICD-10-CM | POA: Diagnosis not present

## 2021-08-06 DIAGNOSIS — M40204 Unspecified kyphosis, thoracic region: Secondary | ICD-10-CM | POA: Diagnosis not present

## 2021-08-06 DIAGNOSIS — M4126 Other idiopathic scoliosis, lumbar region: Secondary | ICD-10-CM | POA: Diagnosis not present

## 2021-08-06 IMAGING — MR MR LUMBAR SPINE W/O CM
5 series · 31 of 48 positions shown · non-contrast
Comparison: Thoracic spine MRI the same day reported separately. CT
lumbar spine [DATE].

CLINICAL DATA: [AGE] female with progressed, severe T11
compression fracture since [REDACTED].

EXAM:
MRI LUMBAR SPINE WITHOUT CONTRAST
TECHNIQUE: Multiplanar, multisequence MR imaging of the lumbar spine was
performed. No intravenous contrast was administered.

[Series 5: T1 · sagittal · 4.0mm · 0.88mm/px · 6 of 25 slices shown (1 of 2)]
[im 1/25]
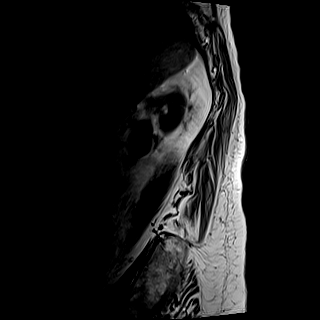
[im 5/25]
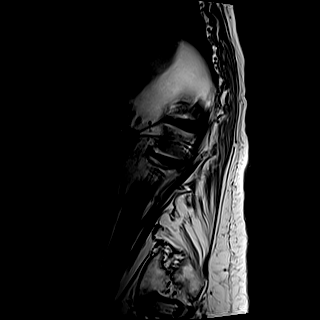
[im 10/25]
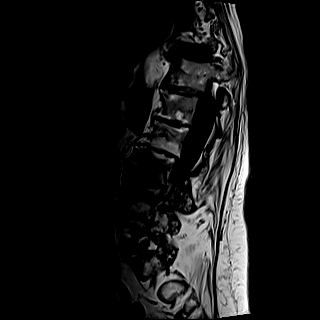
[im 15/25]
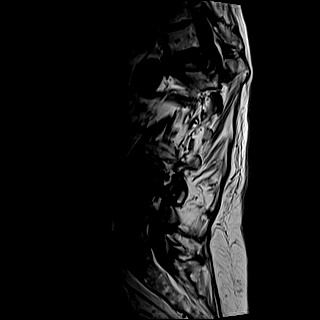
[im 20/25]
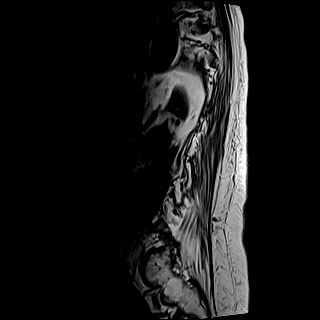
[im 25/25]
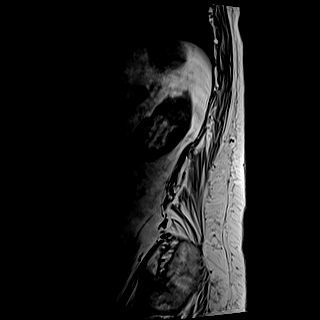

[Series 6: T2 · sagittal · 4.0mm · 0.88mm/px · 7 of 25 slices shown (1 of 2)]
[im 1/25]
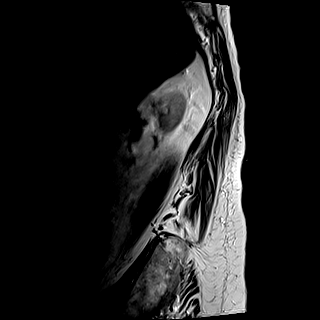
[im 5/25]
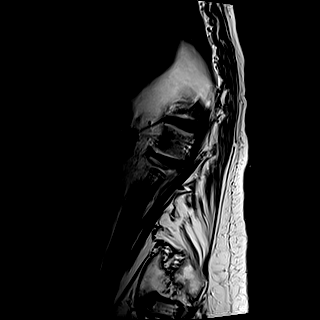
[im 9/25]
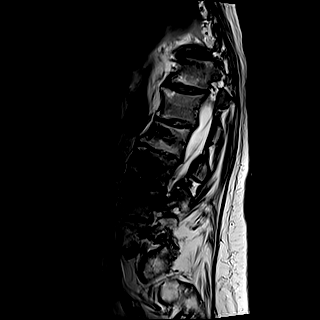
[im 13/25]
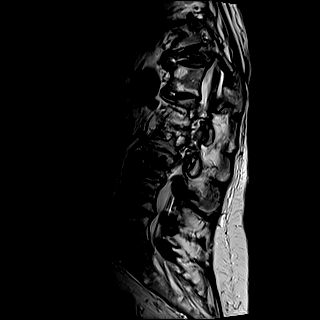
[im 17/25]
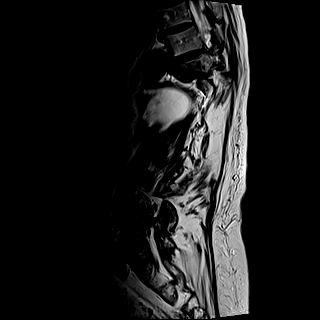
[im 21/25]
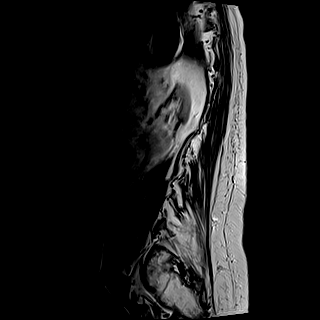
[im 25/25]
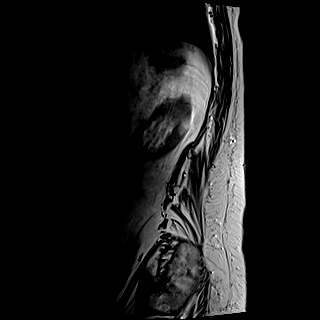

[Series 7: STIR · sagittal · 4.0mm · 0.51mm/px · 2 of 25 slices shown]
[im 1/25]
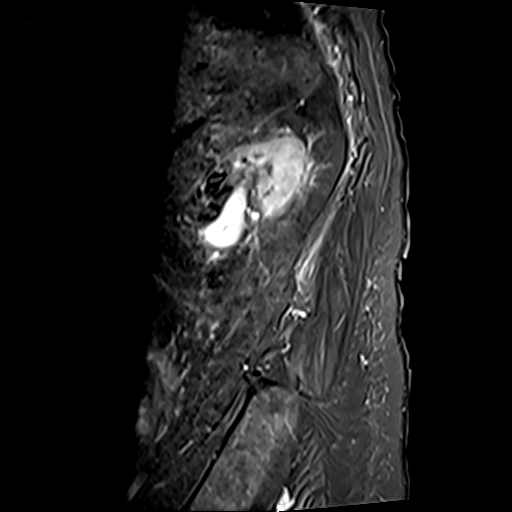
[im 5/25]
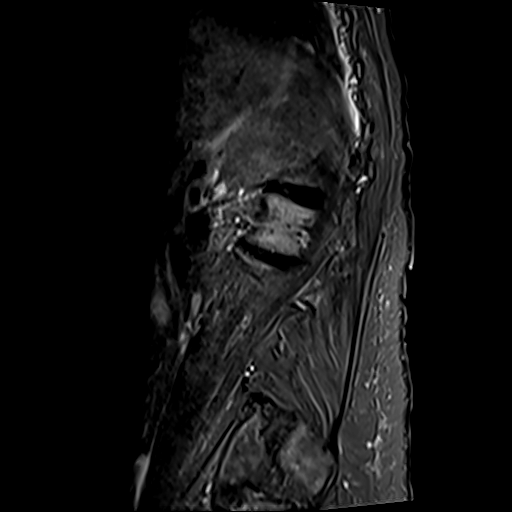

[Series 8: T2 · axial · 4.0mm · 0.62mm/px · z∈[-404,-155]mm · 8 of 51 slices shown (2 of 2)]
[im 1/51]
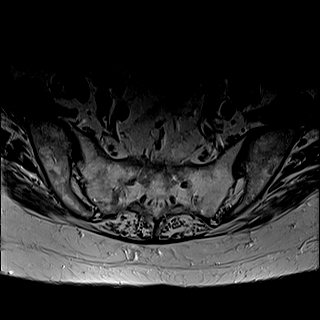
[im 8/51]
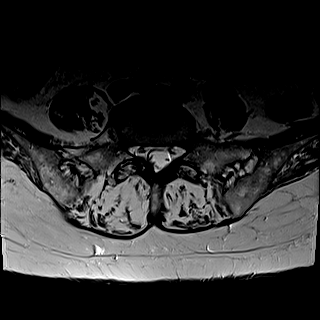
[im 16/51]
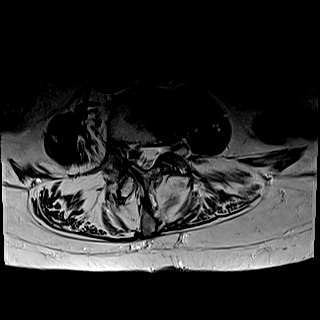
[im 24/51]
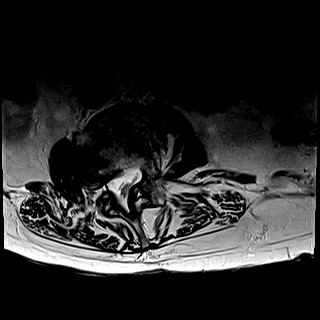
[im 27/51]
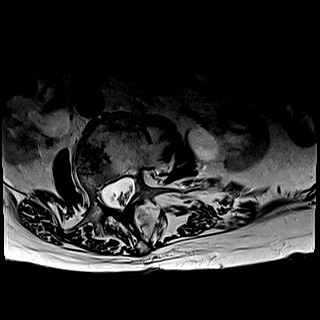
[im 35/51]
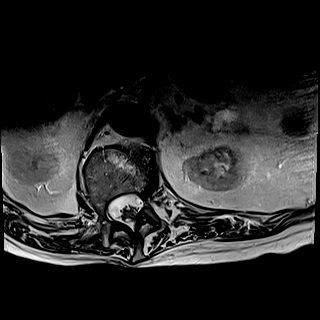
[im 43/51]
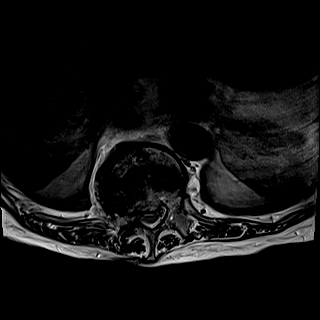
[im 51/51]
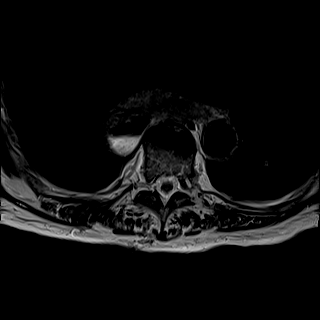

[Series 9: T1 · axial · 4.0mm · 0.39mm/px · z∈[-404,-155]mm · 8 of 51 slices shown (2 of 2)]
[im 1/51]
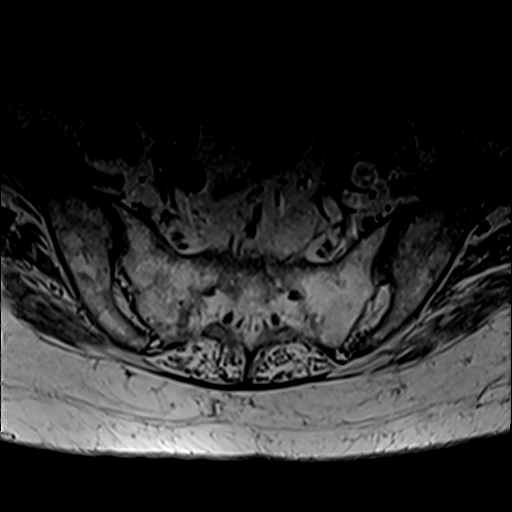
[im 8/51]
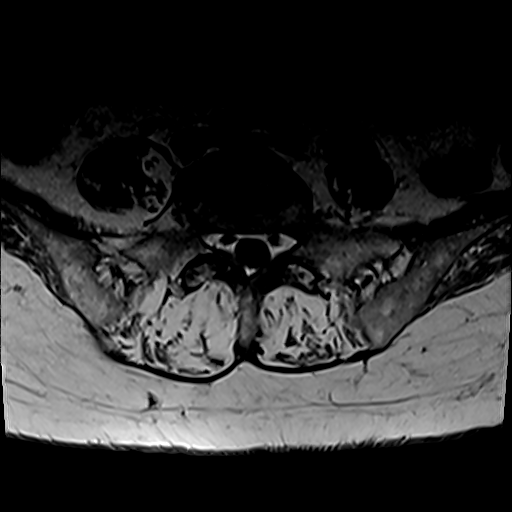
[im 16/51]
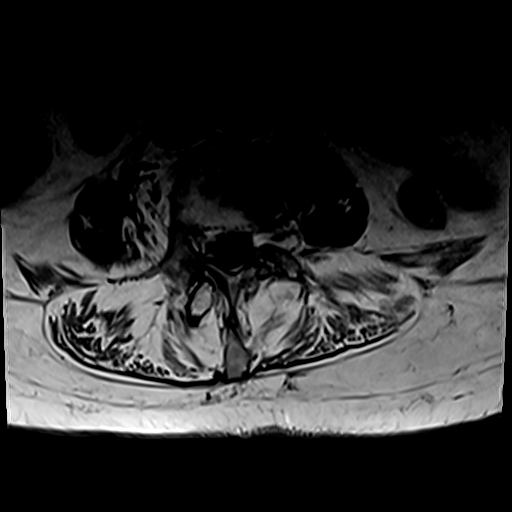
[im 24/51]
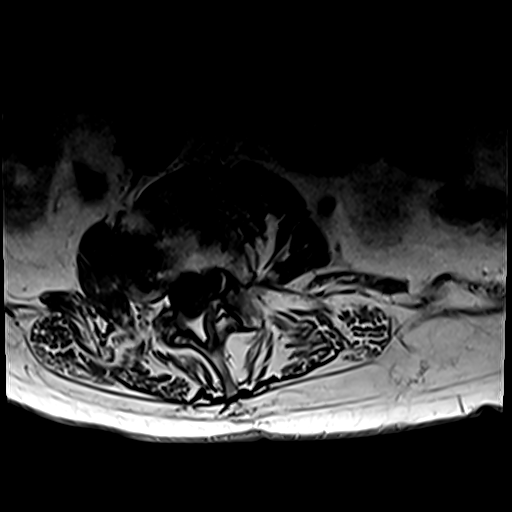
[im 27/51]
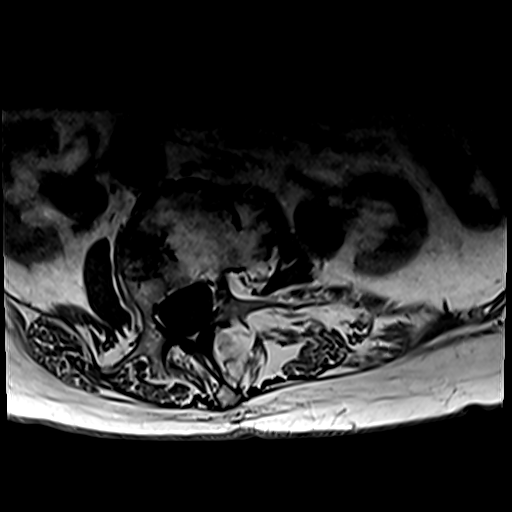
[im 35/51]
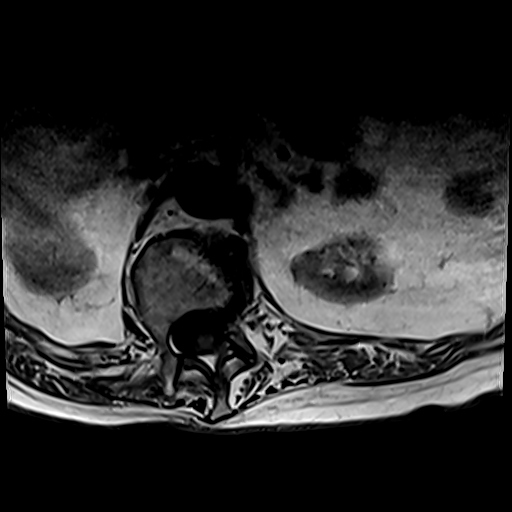
[im 43/51]
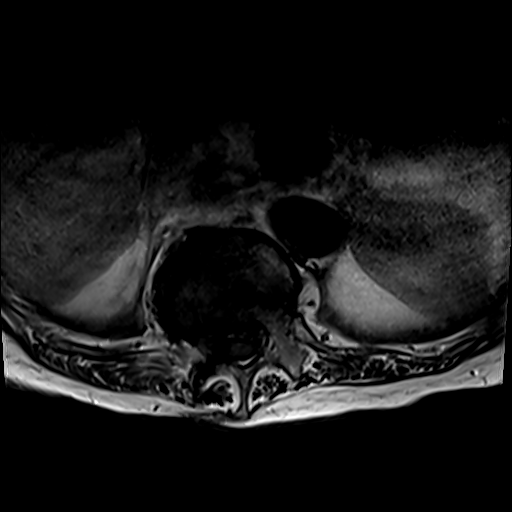
[im 51/51]
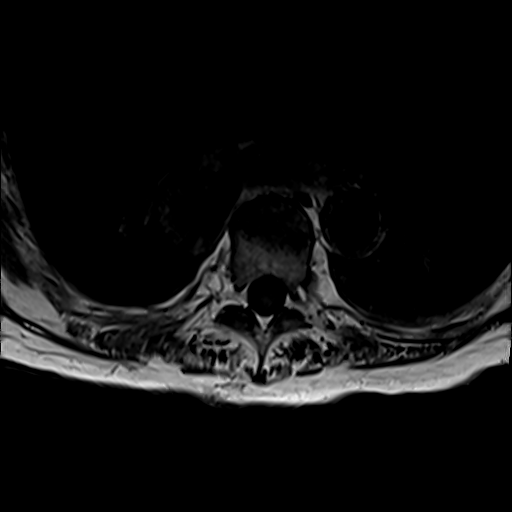

[31 of 48 positions shown; findings below may reference images not displayed]

FINDINGS: Segmentation: Normal, concordant with the thoracic spine numbering
today.

Alignment: Stable. Moderate to severe dextroconvex lumbar scoliosis
apex at L2-L3. subsequent straightening of lumbar lordosis.

Vertebrae: T11 vertebra plana and other lower thoracic spine
findings are reported separately today.

Previously augmented L2 and L3 compression fractures. No lumbar
marrow edema or evidence of acute osseous abnormality. Chronic
degenerative lumbar endplate marrow signal changes. Intact visible
sacrum and SI joints.

Conus medullaris and cauda equina: Conus extends to the T12-L1
level. Mass effect on the spinal cord at T11, but no lower cord or
conus signal abnormality. Capacious lumbar spinal canal at most
levels. Negative cauda equina nerve roots.

Paraspinal and other soft tissues: Moderate to large gastric hiatal
hernia redemonstrated. Stable visible abdominal viscera including
renal cysts, extrarenal pelves. Negative lumbar paraspinal soft
tissues.

Disc levels:

Stable lumbar spine degeneration since [REDACTED], notable for moderate
to severe neural foraminal stenosis at the left L1 through L3 nerve
levels, right L4 neural level. Moderate to severe lateral recess
stenosis at L4-L5 (right L5 nerve level). But only mild
multifactorial spinal stenosis at L3-L4 and L4-L5.
IMPRESSION: 1. No change from the Lumbar Spine CT in [REDACTED]. No acute finding
in the lumbar spine.

2. Complicated T11 vertebra plana reported separately on the
Thoracic MRI today.

## 2021-08-06 IMAGING — MR MR THORACIC SPINE W/O CM
6 series · 41 of 48 positions shown · non-contrast
Comparison: Chest radiographs [DATE]. CT lumbar spine
[DATE].

Lumbar MRI the same day reported separately.

CLINICAL DATA: [AGE] female with compression fracture of
vertebra.

EXAM:
MRI THORACIC SPINE WITHOUT CONTRAST
TECHNIQUE: Multiplanar, multisequence MR imaging of the thoracic spine was
performed. No intravenous contrast was administered.

[Series 16: T1 · sagittal · 4.0mm · 1.72mm/px · 3 of 11 slices shown (1 of 2)]
[im 1/11]
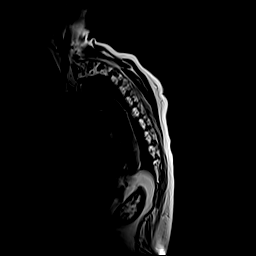
[im 6/11]
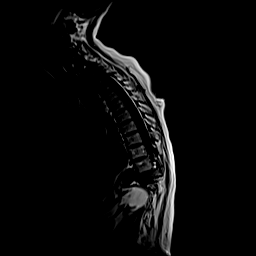
[im 11/11]
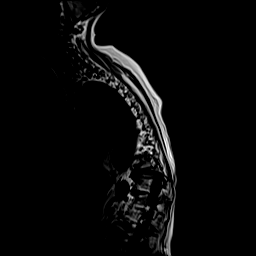

[Series 17: STIR · sagittal · 3.0mm · 1.00mm/px · 7 of 18 slices shown]
[im 1/18]
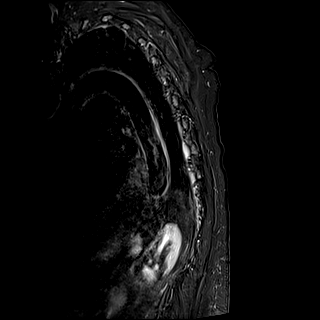
[im 3/18]
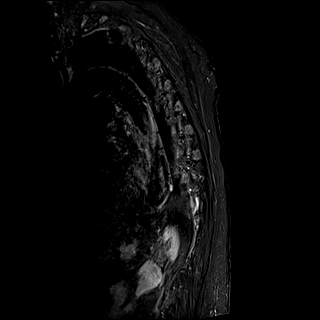
[im 6/18]
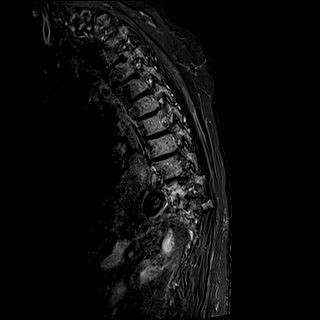
[im 9/18]
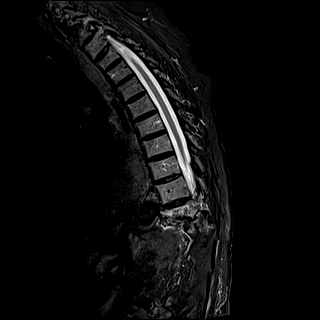
[im 12/18]
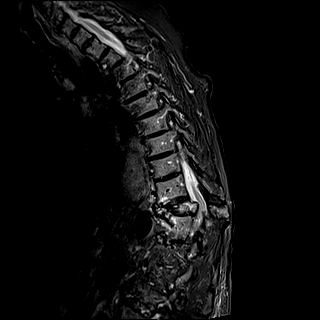
[im 15/18]
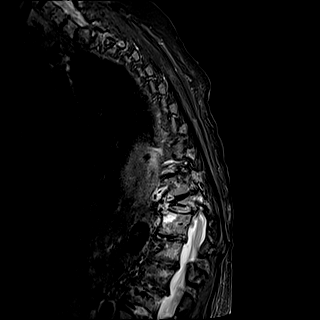
[im 18/18]
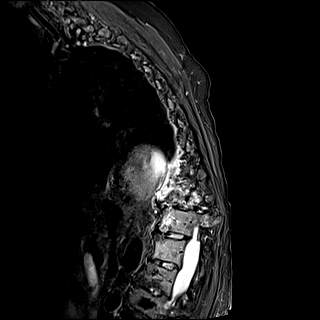

[Series 18: T1 · sagittal · 3.0mm · 1.00mm/px · 7 of 18 slices shown (2 of 2)]
[im 1/18]
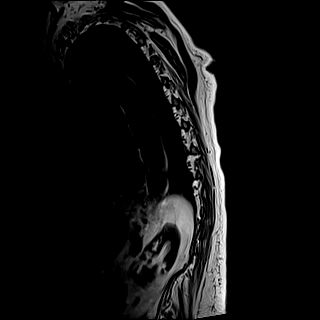
[im 3/18]
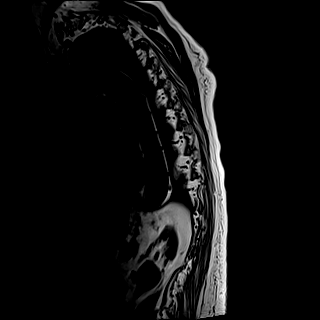
[im 6/18]
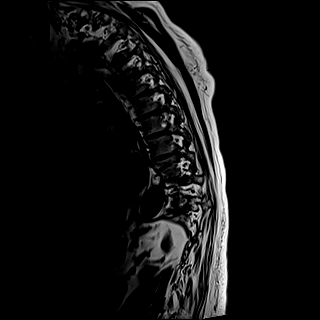
[im 9/18]
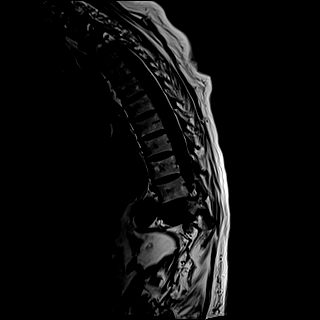
[im 12/18]
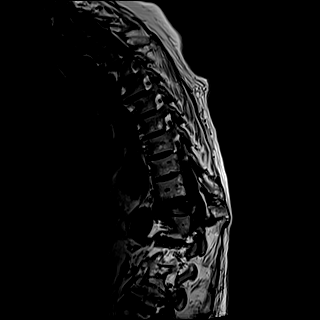
[im 15/18]
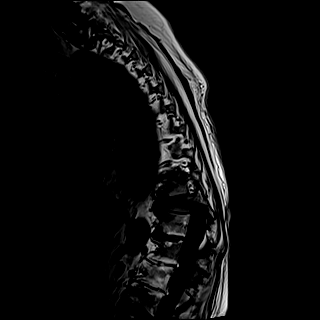
[im 18/18]
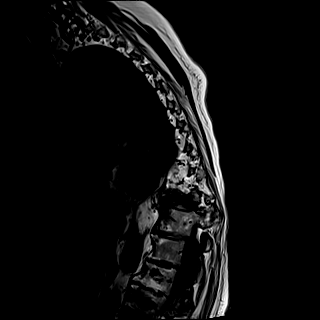

[Series 19: T2 · sagittal · 3.0mm · 0.83mm/px · 7 of 18 slices shown (1 of 2)]
[im 1/18]
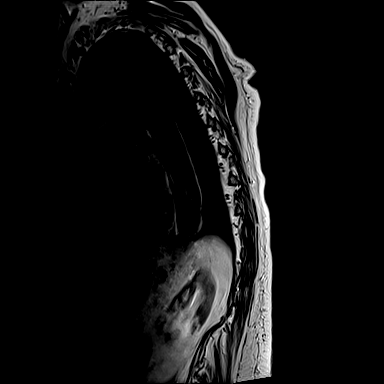
[im 3/18]
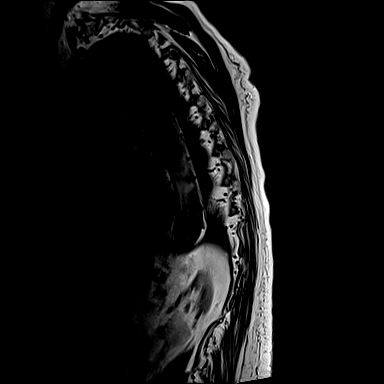
[im 6/18]
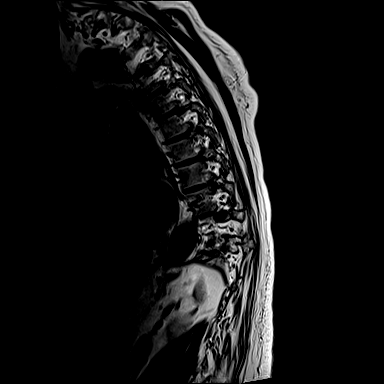
[im 9/18]
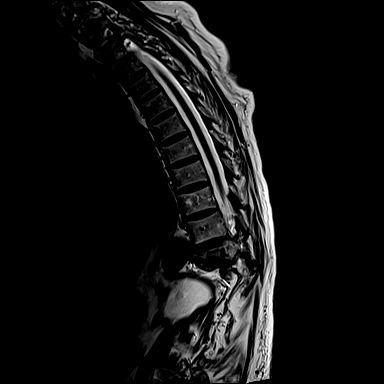
[im 12/18]
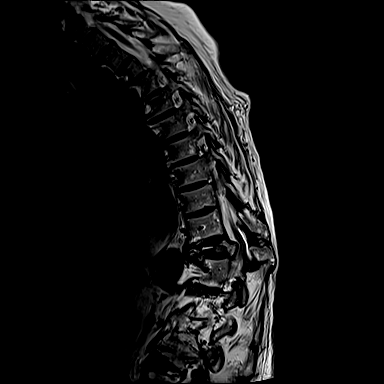
[im 15/18]
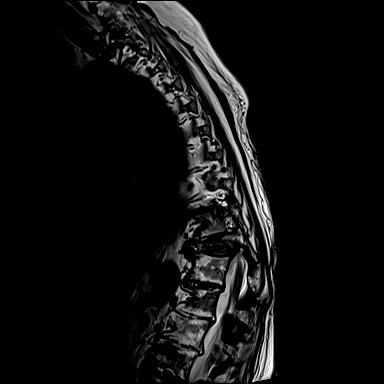
[im 18/18]
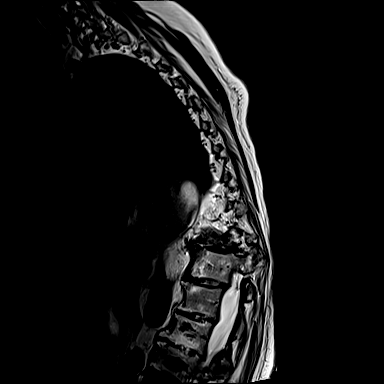

[Series 20: T2 · axial · 4.0mm · 0.78mm/px · z∈[-194,-87]mm · 9 of 30 slices shown (2 of 2)]
[im 1/30]
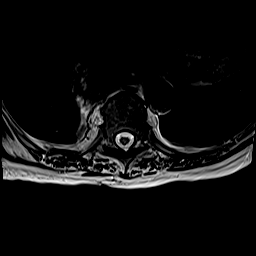
[im 6/30]
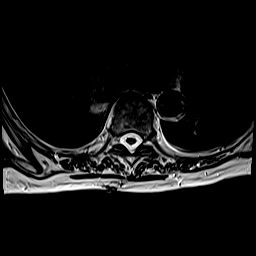
[im 8/30]
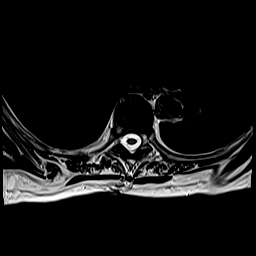
[im 14/30]
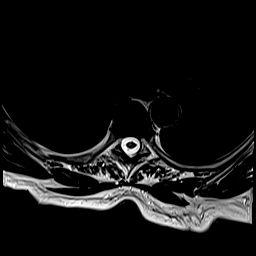
[im 16/30]
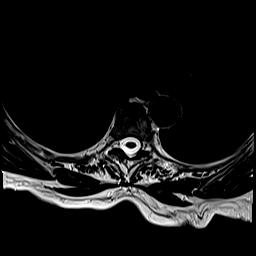
[im 22/30]
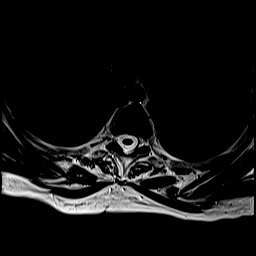
[im 24/30]
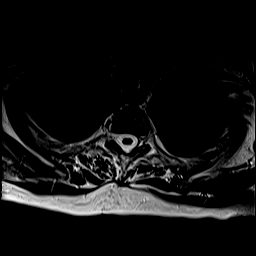
[im 27/30]
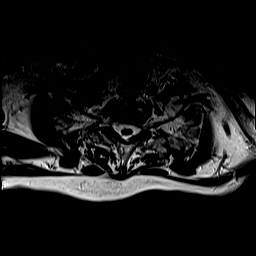
[im 30/30]
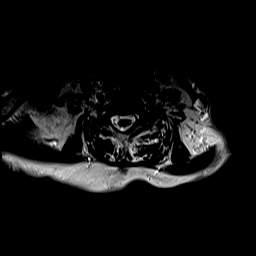

[Series 21: t2_me2d_tra · axial · 4.0mm · 0.52mm/px · z∈[-194,-87]mm · 8 of 30 slices shown]
[im 1/30]
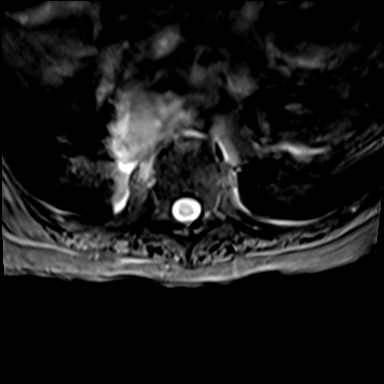
[im 6/30]
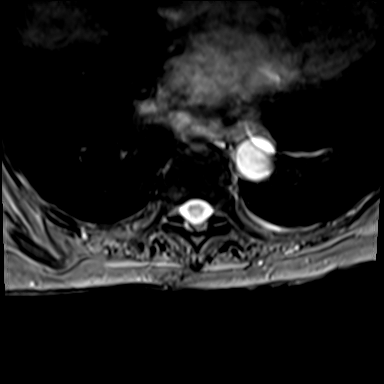
[im 8/30]
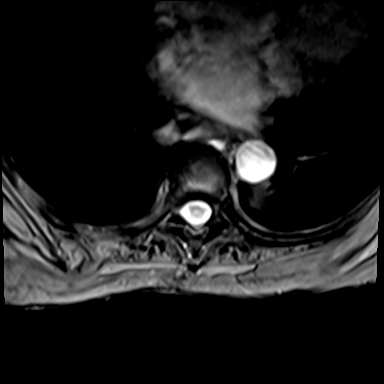
[im 14/30]
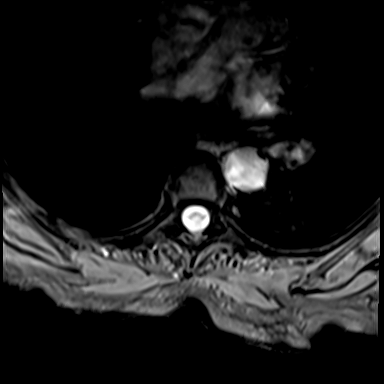
[im 16/30]
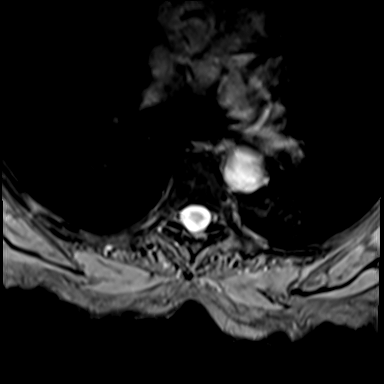
[im 22/30]
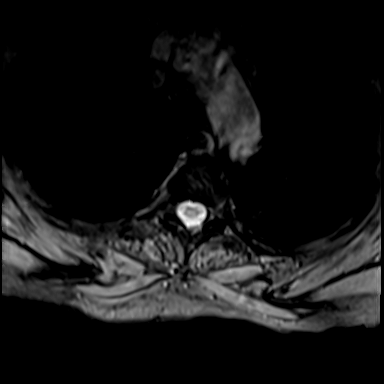
[im 24/30]
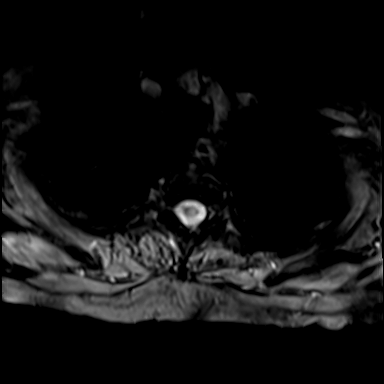
[im 30/30]
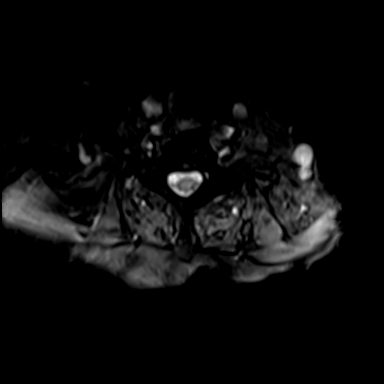

[41 of 48 positions shown; findings below may reference images not displayed]

FINDINGS: Limited cervical spine imaging: Negative aside from evidence of
interbody ankylosis at C5-C6.

Thoracic spine segmentation: Appears to be normal, concordant with
the lumbar CT last year.

Alignment: Relatively normal thoracic kyphosis above T10. Focal
kyphosis about the chronic T11 fracture appears increased since
[REDACTED] and is roughly 34 degrees. Mild superimposed levoconvex
thoracic scoliosis.

Vertebrae: Severe T11 compression fracture, vertebra plana now,
progressed since the [facility] CT, but with only mild associated
marrow edema. Largely new retropulsion of bone (lumbar comparison
series 8, image 9) superimposed on posterior element degeneration
results in mild to moderate spinal stenosis and spinal cord mass
effect (series 19, image 7). See additional cord details below.

There is confluent marrow edema at the adjacent T10 and T12 anterior
endplates. No loss of height at those levels.

Maintained thoracic vertebral height elsewhere. No other thoracic
marrow edema or acute osseous abnormality.

Cord: Capacious thoracic spinal canal above T11. Normal thoracic
spinal cord signal and morphology through those levels.

Best seen on series 8, image 9 of the lumbar comparison the T11
thoracic cord is partially compressed but signal appears to remain
normal. Conus medullaris better demonstrated on the lumbar exam.

Paraspinal and other soft tissues: Moderate to large gastric hiatal
hernia was visible in [REDACTED]. Otherwise negative visible thoracic
viscera. T11 level mild paraspinal soft tissue edema appears largely
resolved.

Disc levels:

Negative for age except at T11.

T10-T11: Up to moderate facet and ligament flavum hypertrophy.
Negative disc. T11 retropulsion. Mild right T10 foraminal stenosis.

T11-T12: T11 retropulsion. Minor superimposed disc bulging. Moderate
facet and ligament flavum hypertrophy. T11 level mild to moderate
spinal stenosis and spinal cord mass effect. Severe bilateral T11
foraminal stenosis.
IMPRESSION: 1. Progressed T11 compression fracture since [REDACTED], now with
vertebra plana, increased focal kyphosis of about 34 degrees, and
bulky retropulsion of bone.
Subsequent mild to moderate spinal stenosis AND spinal cord mass
effect at the T11 level with severe T11 foraminal stenosis. But no
cord signal abnormality.

2. Preserved thoracic vertebral height otherwise. Marrow edema in
the adjacent T10 and T12 vertebral bodies is probably related to
abnormal biomechanics rather than impending fractures.

3. Moderate to large gastric hiatal hernia.

## 2021-08-09 ENCOUNTER — Other Ambulatory Visit: Payer: Self-pay

## 2021-08-09 ENCOUNTER — Ambulatory Visit (INDEPENDENT_AMBULATORY_CARE_PROVIDER_SITE_OTHER): Payer: Medicare Other | Admitting: Cardiology

## 2021-08-09 ENCOUNTER — Encounter: Payer: Self-pay | Admitting: Cardiology

## 2021-08-09 DIAGNOSIS — K922 Gastrointestinal hemorrhage, unspecified: Secondary | ICD-10-CM

## 2021-08-09 DIAGNOSIS — N1831 Chronic kidney disease, stage 3a: Secondary | ICD-10-CM

## 2021-08-09 DIAGNOSIS — N183 Chronic kidney disease, stage 3 unspecified: Secondary | ICD-10-CM | POA: Insufficient documentation

## 2021-08-09 DIAGNOSIS — I4891 Unspecified atrial fibrillation: Secondary | ICD-10-CM | POA: Diagnosis not present

## 2021-08-09 MED ORDER — FUROSEMIDE 20 MG PO TABS: 20.0000 mg | ORAL_TABLET | Freq: Every day | ORAL | 3 refills | Status: DC

## 2021-08-09 NOTE — Progress Notes (Signed)
Cardiology Office Note:    Date:  08/09/2021   ID:  Cassandra Ray, DOB Oct 27, 1922, MRN [id number]  PCP:  Lawerance Cruel, MD   [facility] Cardiologist:  Candee Furbish, MD     Referring MD: Lawerance Cruel, MD    History of Present Illness:    Cassandra Ray is a 88 y.o. female here for the follow-up atrial fibrillation.  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  Dr. Benson Norway discussed with me.  Concerned about reinitiation of anticoagulation.  Echocardiogram in July 2020 showed normal ejection fraction.  Has had demand ischemia in the setting of A-fib.  Her husband wheeled her in today.  Lab work has been monitored for amiodarone by outside lab.  Last TSH 3.2 ALT 18 creatinine 1.9 hemoglobin 12.7  She feels well.  No shortness of breath, no chest pain no further bleeding.  She is here today with her husband and daughter.  They are moving to the assisted living program at [facility]  Past Medical History:  Diagnosis Date   Actinic keratoses    Arthritis    back    Breast cancer ([place])    Breast cancer (right)   Chronic diastolic CHF (congestive heart failure) (HCC)    CKD (chronic kidney disease), stage III (HCC)    Compression fracture of L3 lumbar vertebra    Dysphagia    GERD (gastroesophageal reflux disease)    takes otc Pepcid as needed   Hiatal hernia 11/27/2014   HTN (hypertension)    Hypothyroidism    Insomnia    Osteopenia    PAF (paroxysmal atrial fibrillation) (HCC)    a. questionable episode in 2015 in [place]. b. event monitor through December/January 2014/2015 which showed no evidence of atrial fibrillation. c. Dx 12/2018.   PAT (paroxysmal atrial tachycardia) (HCC)    Pleural effusion     Past Surgical History:  Procedure Laterality Date   BREAST LUMPECTOMY Right 1995   lumpectomy   COLONOSCOPY     ESOPHAGOGASTRODUODENOSCOPY (EGD) WITH PROPOFOL Left 01/09/2019   Procedure: ESOPHAGOGASTRODUODENOSCOPY  (EGD) WITH PROPOFOL;  Surgeon: Carol Ada, MD;  Location: [facility];  Service: Endoscopy;  Laterality: Left;   EYE SURGERY Bilateral    cataract w/ lens implant   KYPHOPLASTY N/A 09/18/2013   Procedure: KYPHOPLASTY;  Surgeon: Sinclair Ship, MD;  Location: [facility];  Service: Orthopedics;  Laterality: N/A;  Lumbar 3 kyphoplasty   TONSILLECTOMY      Current Medications: Current Meds  Medication Sig   acetaminophen (TYLENOL) 500 MG tablet Take 2 tablets (1,000 mg total) by mouth every 8 (eight) hours.   amiodarone (PACERONE) 200 MG tablet Take 1 tablet (200 mg total) by mouth daily. Please keep upcoming appt for future refills   B Complex-C (B-COMPLEX WITH VITAMIN C) tablet Take 1 tablet by mouth daily.   Cholecalciferol (VITAMIN D3) 2000 units TABS Take 2,000 Units by mouth daily.    furosemide (LASIX) 20 MG tablet Take 1 tablet (20 mg total) by mouth daily.   ipratropium (ATROVENT) 0.03 % nasal spray Place 2 sprays into both nostrils 2 (two) times daily.   levothyroxine (SYNTHROID) 100 MCG tablet Take 100 mcg by mouth every morning.   loperamide (IMODIUM A-D) 2 MG tablet Take 2 mg by mouth daily as needed for diarrhea or loose stools.    melatonin 5 MG TABS Take 5 mg by mouth at bedtime as needed.   methocarbamol (ROBAXIN) 500 MG tablet Take  250 mg by mouth 2 (two) times daily as needed for muscle spasms.   metoprolol tartrate (LOPRESSOR) 25 MG tablet Take 12.5 mg by mouth 2 (two) times daily.   nitroGLYCERIN (NITROSTAT) 0.4 MG SL tablet Place 1 tablet (0.4 mg total) under the tongue every 5 (five) minutes as needed for chest pain.   ondansetron (ZOFRAN) 4 MG tablet Take 4 mg by mouth every 6 (six) hours as needed for nausea or vomiting.   pantoprazole (PROTONIX) 40 MG tablet Take 1 tablet (40 mg total) by mouth daily.   polyethylene glycol powder (GLYCOLAX/MIRALAX) 17 GM/SCOOP powder Take 17 g by mouth daily.   Probiotic Product (ALIGN) 4 MG CAPS Take 4 mg by mouth daily.    sennosides-docusate sodium (SENOKOT-S) 8.6-50 MG tablet Take 1 tablet by mouth at bedtime as needed for constipation.   traMADol (ULTRAM) 50 MG tablet Take 25 mg by mouth every 6 (six) hours as needed.   [DISCONTINUED] furosemide (LASIX) 40 MG tablet TAKE 1 TABLET ONCE DAILY.   Current Facility-Administered Medications for the 08/09/21 encounter (Office Visit) with Jerline Pain, MD  Medication   bisacodyl (DULCOLAX) suppository 10 mg     Allergies:   Tape   Social History   Socioeconomic History   Marital status: Married    Spouse name: Not on file   Number of children: Not on file   Years of education: Not on file   Highest education level: Not on file  Occupational History   Not on file  Tobacco Use   Smoking status: Former   Smokeless tobacco: Never   Tobacco comments:    only in college  Vaping Use   Vaping Use: Never used  Substance and Sexual Activity   Alcohol use: Yes    Alcohol/week: 7.0 standard drinks    Types: 7 Glasses of wine per week    Comment: 1 glass of wine a day   Drug use: No   Sexual activity: Not on file  Other Topics Concern   Not on file  Social History Narrative   Not on file   Social Determinants of Health   Financial Resource Strain: Not on file  Food Insecurity: Not on file  Transportation Needs: Not on file  Physical Activity: Not on file  Stress: Not on file  Social Connections: Not on file     Family History: The patient's family history includes CAD in her mother; Diabetes Mellitus II in her mother; Heart disease in her sister; Heart failure in her father and mother; Kidney cancer in her brother; Prostate cancer in her brother. There is no history of Esophageal cancer or Colon cancer.  ROS:   Please see the history of present illness.    All other systems reviewed and are negative.  EKGs/Labs/Other Studies Reviewed:    The following studies were reviewed today:  Echo 12/13/2018:  1. The left ventricle has normal systolic  function with an ejection  fraction of 60-65%. The cavity size was normal. There is mildly increased  left ventricular wall thickness. Left ventricular diastolic Doppler  parameters are indeterminate.   2. Trivial pericardial effusion is present.   3. The mitral valve is abnormal. Mild thickening of the mitral valve  leaflet. There is mild mitral annular calcification present.   4. The tricuspid valve is grossly normal.   5. The aortic valve is tricuspid.   EKG:  EKG is  ordered today.  The ekg ordered today demonstrates sinus rhythm 62 poor R wave  progression left axis deviation  Recent Labs: 09/21/2020: ALT 18; TSH 3.280 01/21/2021: BUN 30; Creatinine 1.9; Hemoglobin 12.7; Platelets 224; Potassium 4.1; Sodium 136  Recent Lipid Panel    Component Value Date/Time   CHOL 128 12/13/2018 0500   TRIG 63 12/13/2018 0500   HDL 79 12/13/2018 0500   CHOLHDL 1.6 12/13/2018 0500   VLDL 13 12/13/2018 0500   LDLCALC 36 12/13/2018 0500     Risk Assessment/Calculations:              Physical Exam:    VS:  BP 136/76 (BP Location: Left Arm, Patient Position: Sitting, Cuff Size: Normal)    Pulse 62    Wt 137 lb 12.8 oz (62.5 kg)    LMP  (LMP Unknown)    SpO2 97%    BMI 23.80 kg/m     Wt Readings from Last 3 Encounters:  08/09/21 137 lb 12.8 oz (62.5 kg)  02/25/21 147 lb 12.8 oz (67 kg)  01/24/21 162 lb (73.5 kg)     GEN: Elderly, in wheelchair in no acute distress HEENT: Normal NECK: No JVD; No carotid bruits LYMPHATICS: No lymphadenopathy CARDIAC: RRR, soft systolic murmur, no rubs, gallops RESPIRATORY:  Clear to auscultation without rales, wheezing or rhonchi  ABDOMEN: Soft, non-tender, non-distended MUSCULOSKELETAL:  No edema; No deformity  SKIN: Warm and dry NEUROLOGIC:  Alert and oriented x 3 PSYCHIATRIC:  Normal affect   ASSESSMENT:    1. Atrial fibrillation with RVR (HCC)   2. Upper GI bleed   3. Stage 3a chronic kidney disease (HCC)    PLAN:    In order of  problems listed above:  Atrial fibrillation with RVR (HCC) On amiodarone therapy 200 mg a day to help maintain normal rhythm.  Current EKG shows sinus rhythm 62 with no other significant abnormalities other than poor R wave progression and left axis deviation.  Upper GI bleed Prior GI bleed.  We did not reinitiate anticoagulation because of this.  Discussed with GI at length.  Chronic kidney disease, stage III (moderate) (HCC) She is seeing Dr. Hollie Salk soon.  Avoid NSAIDs.  I will go ahead and decrease her Lasix to 20 mg a day.  She does not appear to have any excessive fluid on board.  She was taking 40.      Medication Adjustments/Labs and Tests Ordered: Current medicines are reviewed at length with the patient today.  Concerns regarding medicines are outlined above.  No orders of the defined types were placed in this encounter.  Meds ordered this encounter  Medications   furosemide (LASIX) 20 MG tablet    Sig: Take 1 tablet (20 mg total) by mouth daily.    Dispense:  90 tablet    Refill:  3    There are no Patient Instructions on file for this visit.   Signed, Candee Furbish, MD  08/09/2021 5:01 PM    [facility]

## 2021-08-09 NOTE — Assessment & Plan Note (Signed)
Prior GI bleed.  We did not reinitiate anticoagulation because of this.  Discussed with GI at length. ?

## 2021-08-09 NOTE — Patient Instructions (Signed)
Medication Instructions:  ?Please decrease your Furosemide to 20 mg a day. ?Continue all other medications as listed. ? ?*If you need a refill on your cardiac medications before your next appointment, please call your pharmacy* ? ?Follow-Up: ?At [facility], you and your health needs are our priority.  As part of our continuing mission to provide you with exceptional heart care, we have created designated Provider Care Teams.  These Care Teams include your primary Cardiologist (physician) and Advanced Practice Providers (APPs -  Physician Assistants and Nurse Practitioners) who all work together to provide you with the care you need, when you need it. ? ?We recommend signing up for the patient portal called "MyChart".  Sign up information is provided on this After Visit Summary.  MyChart is used to connect with patients for Virtual Visits (Telemedicine).  Patients are able to view lab/test results, encounter notes, upcoming appointments, etc.  Non-urgent messages can be sent to your provider as well.   ?To learn more about what you can do with MyChart, go to [email or web address].   ? ?Your next appointment:   ?1 year(s) ? ?The format for your next appointment:   ?In Person ? ?Provider:   ?Candee Furbish, MD   ? ?Thank you for choosing [facility]!! ? ? ? ?

## 2021-08-09 NOTE — Assessment & Plan Note (Signed)
On amiodarone therapy 200 mg a day to help maintain normal rhythm.  Current EKG shows sinus rhythm 62 with no other significant abnormalities other than poor R wave progression and left axis deviation. ?

## 2021-08-09 NOTE — Assessment & Plan Note (Signed)
She is seeing Dr. Hollie Salk soon.  Avoid NSAIDs.  I will go ahead and decrease her Lasix to 20 mg a day.  She does not appear to have any excessive fluid on board.  She was taking 40. ?

## 2021-08-10 NOTE — Addendum Note (Signed)
Addended by: Shellia Cleverly on: 08/10/2021 03:53 PM ? ? Modules accepted: Orders ? ?

## 2021-08-12 DIAGNOSIS — E039 Hypothyroidism, unspecified: Secondary | ICD-10-CM | POA: Diagnosis not present

## 2021-08-12 DIAGNOSIS — M545 Low back pain, unspecified: Secondary | ICD-10-CM | POA: Diagnosis not present

## 2021-08-12 DIAGNOSIS — H6123 Impacted cerumen, bilateral: Secondary | ICD-10-CM | POA: Diagnosis not present

## 2021-08-17 DIAGNOSIS — I509 Heart failure, unspecified: Secondary | ICD-10-CM | POA: Diagnosis not present

## 2021-08-17 DIAGNOSIS — N189 Chronic kidney disease, unspecified: Secondary | ICD-10-CM | POA: Diagnosis not present

## 2021-08-17 DIAGNOSIS — I480 Paroxysmal atrial fibrillation: Secondary | ICD-10-CM | POA: Diagnosis not present

## 2021-08-17 DIAGNOSIS — I129 Hypertensive chronic kidney disease with stage 1 through stage 4 chronic kidney disease, or unspecified chronic kidney disease: Secondary | ICD-10-CM | POA: Diagnosis not present

## 2021-08-17 DIAGNOSIS — D631 Anemia in chronic kidney disease: Secondary | ICD-10-CM | POA: Diagnosis not present

## 2021-08-17 DIAGNOSIS — N2581 Secondary hyperparathyroidism of renal origin: Secondary | ICD-10-CM | POA: Diagnosis not present

## 2021-08-17 DIAGNOSIS — N184 Chronic kidney disease, stage 4 (severe): Secondary | ICD-10-CM | POA: Diagnosis not present

## 2021-09-02 ENCOUNTER — Telehealth: Payer: Self-pay | Admitting: Cardiology

## 2021-09-02 NOTE — Telephone Encounter (Signed)
Nurse need patient prescription for furosemide (LASIX) 20 MG tablet fax to their facility. Fax number [phone]. Please advise ?

## 2021-09-02 NOTE — Telephone Encounter (Signed)
I spoke with Cassandra Ray.  Patient is a new resident at [facility].  Cassandra Ray is calling to clarify furosemide dose.  I told her dose was decreased to 20 mg daily at office visit on 08/09/21.  Cassandra Ray reports patient has been receiving 40 mg daily for the last week.  She requests order for lasix 20 mg daily be faxed.  Will fax to number listed.  ?

## 2021-09-08 ENCOUNTER — Ambulatory Visit: Payer: Medicare Other | Admitting: Cardiology

## 2021-09-08 NOTE — Telephone Encounter (Signed)
Reviewed with Tobi Bastos, RN - handwritten rx was completed and faxed as requested. ?

## 2021-09-09 ENCOUNTER — Telehealth: Payer: Self-pay | Admitting: Cardiology

## 2021-09-09 NOTE — Telephone Encounter (Signed)
Pt c/o swelling: STAT is pt has developed SOB within 24 hours ? ?If swelling, where is the swelling located? legs ? ?How much weight have you gained and in what time span? NA ? ?Have you gained 3 pounds in a day or 5 pounds in a week? NA ? ?Do you have a log of your daily weights (if so, list)? 140 lbs ? ?Are you currently taking a fluid pill? yes ? ?Are you currently SOB? No ? ?Have you traveled recently? No ? ?Pt's husband states that pt has been having swelling in her legs and ankles. Husband states that the nurse at the [facility] states that pt may want to get fluid pill increased to '40mg'$ . If increased nurse would like a copy of the med change faxed to  [facility] @ [phone] for their records. Please asdvise ? ? ? ?

## 2021-09-09 NOTE — Telephone Encounter (Signed)
Spoke with nurse Tabatha at [facility] who reports pt has developed bilaterally 2+ pitting edema.  Wt at 142 lbs.  No SOB or other complaints at this time.  Pt is wearing knee high compression stockings daily.  Advised to increase Furosemide to 40 mg day X 3 days then return to 20 mg daily.  Continue to monitor.  RX faxed to [facility] at [phone]. ?

## 2021-09-15 ENCOUNTER — Telehealth: Payer: Self-pay | Admitting: Cardiology

## 2021-09-15 NOTE — Telephone Encounter (Signed)
Spoke with Tabatha and pt is only having leg swelling no other symptoms and is currently doing everything to control swelling low salt diet ,compressions stocking ,and leg elevation (most of day) Pt currently taking Furosemide 20 mg previously did take 40 mg for 3 days then resumed to the 20 mg Will forward to Dr [name] for review and recommendations ./cy ?

## 2021-09-15 NOTE — Telephone Encounter (Signed)
Pt c/o swelling: STAT is pt has developed SOB within 24 hours ? ?If swelling, where is the swelling located? Right leg +2 almost 3, left leg +1 ? ?How much weight have you gained and in what time span?  ? ?Have you gained 3 pounds in a day or 5 pounds in a week?  ? ?Do you have a log of your daily weights (if so, list)?  ? ?Are you currently taking a fluid pill? yes ? ?Are you currently SOB?  ? ?Have you traveled recently? Patient would like 40 mg of lasix every day.  She is currently on '20mg'$ .  See note dated 4/7.    ?

## 2021-09-15 NOTE — Telephone Encounter (Signed)
Discussed with Dr Ali Lowe  have pt take Furosemide  40 mg for 3 days  and then resume 20 mg and check BMET . ?[facility] notified  and order faxed for med change and lab ./cy ?

## 2021-09-16 DIAGNOSIS — D649 Anemia, unspecified: Secondary | ICD-10-CM | POA: Diagnosis not present

## 2021-09-22 ENCOUNTER — Telehealth: Payer: Self-pay | Admitting: Cardiology

## 2021-09-22 NOTE — Telephone Encounter (Signed)
Spoke with Rosann Auerbach at [facility].  Pt has 1 + pitting edema currently.  No other s/s. Pt is ordered to take Furosemide 20 mg daily. ?On 4/7 and 4/13 nursing staff has called regarding pt's edema and they hav been given orders to increase Furosemide to 40 mg daily X 3.  Pt had BMP 09/16/21 - Crea 1.68, BUN 37.2, K+ 3.9. ? ?Advised I will have Dr [name] to review and fax over any new orders.  They are continuing to limit the NA+ in her diet, she is wearing compression hose and elevating feet/legs as much as possible.   ?

## 2021-09-22 NOTE — Telephone Encounter (Signed)
? ?  [facility] calling to f/u pt swelling, they said, pt swelling went down a little bit but still have it. They compression socks with pt and it is not helping. They wanted to know what they need to do next  ?

## 2021-09-23 NOTE — Telephone Encounter (Signed)
Lets increase the Lasix to 40 mg once a day.  Thank you.  ?Candee Furbish, MD    ?  Rx faxed to [facility] as requested. ?

## 2021-09-23 NOTE — Telephone Encounter (Signed)
Should pt continue with Furosemide 20 mg daily and staff call if edema/SOB/wt gain or increase her to 40 mg daily? ?

## 2021-09-26 DIAGNOSIS — H524 Presbyopia: Secondary | ICD-10-CM | POA: Diagnosis not present

## 2021-09-26 DIAGNOSIS — Z961 Presence of intraocular lens: Secondary | ICD-10-CM | POA: Diagnosis not present

## 2021-09-30 DIAGNOSIS — R062 Wheezing: Secondary | ICD-10-CM | POA: Diagnosis not present

## 2021-10-03 ENCOUNTER — Telehealth: Payer: Self-pay | Admitting: Cardiology

## 2021-10-03 NOTE — Telephone Encounter (Signed)
Patient's husband called, they are residents of [facility]. Mr. Cassandra Ray states that his wife had some wheezing last week and [facility] took an Xray of her chest and sent it to Dr. [name]. He was wondering if Dr. [name] has had the chance to look at it and does he think she needs to come in for an appointment? Please advise.  ?

## 2021-10-04 NOTE — Telephone Encounter (Signed)
Cassandra Pain, MD  You; Darrell Jewel, RN 18 minutes ago (2:46 PM)  ? ?I did not receive any correspondence.  My recommendation is to let the [facility] physician team evaluate.   ? ? ?Spoke with pt's husband Cassandra Ray) and advised this office has not received any CXR results from [facility].  Advised per Dr [name] for pt to f/u with PCP for further w/up and eval. ?

## 2021-12-01 DIAGNOSIS — D0471 Carcinoma in situ of skin of right lower limb, including hip: Secondary | ICD-10-CM | POA: Diagnosis not present

## 2021-12-01 DIAGNOSIS — D0461 Carcinoma in situ of skin of right upper limb, including shoulder: Secondary | ICD-10-CM | POA: Diagnosis not present

## 2021-12-01 DIAGNOSIS — C44622 Squamous cell carcinoma of skin of right upper limb, including shoulder: Secondary | ICD-10-CM | POA: Diagnosis not present

## 2021-12-08 DIAGNOSIS — J45909 Unspecified asthma, uncomplicated: Secondary | ICD-10-CM | POA: Diagnosis not present

## 2021-12-08 DIAGNOSIS — I509 Heart failure, unspecified: Secondary | ICD-10-CM | POA: Diagnosis not present

## 2022-01-13 DIAGNOSIS — M25559 Pain in unspecified hip: Secondary | ICD-10-CM | POA: Diagnosis not present

## 2022-01-13 DIAGNOSIS — M545 Low back pain, unspecified: Secondary | ICD-10-CM | POA: Diagnosis not present

## 2022-01-16 DIAGNOSIS — H52203 Unspecified astigmatism, bilateral: Secondary | ICD-10-CM | POA: Diagnosis not present

## 2022-01-16 DIAGNOSIS — Z961 Presence of intraocular lens: Secondary | ICD-10-CM | POA: Diagnosis not present

## 2022-02-28 DIAGNOSIS — R059 Cough, unspecified: Secondary | ICD-10-CM | POA: Diagnosis not present

## 2022-02-28 DIAGNOSIS — J9811 Atelectasis: Secondary | ICD-10-CM | POA: Diagnosis not present

## 2022-07-19 DIAGNOSIS — L820 Inflamed seborrheic keratosis: Secondary | ICD-10-CM | POA: Diagnosis not present

## 2022-07-19 DIAGNOSIS — L570 Actinic keratosis: Secondary | ICD-10-CM | POA: Diagnosis not present

## 2022-08-14 DIAGNOSIS — Z23 Encounter for immunization: Secondary | ICD-10-CM | POA: Diagnosis not present

## 2022-08-14 DIAGNOSIS — Z0000 Encounter for general adult medical examination without abnormal findings: Secondary | ICD-10-CM | POA: Diagnosis not present

## 2022-08-21 DIAGNOSIS — R051 Acute cough: Secondary | ICD-10-CM | POA: Diagnosis not present

## 2022-08-21 DIAGNOSIS — J069 Acute upper respiratory infection, unspecified: Secondary | ICD-10-CM | POA: Diagnosis not present

## 2022-08-25 DIAGNOSIS — R918 Other nonspecific abnormal finding of lung field: Secondary | ICD-10-CM | POA: Diagnosis not present

## 2022-09-01 ENCOUNTER — Emergency Department (HOSPITAL_COMMUNITY): Payer: Medicare Other

## 2022-09-01 ENCOUNTER — Inpatient Hospital Stay (HOSPITAL_COMMUNITY)
Admission: EM | Admit: 2022-09-01 | Discharge: 2022-09-07 | DRG: 178 | Disposition: A | Discharge status: Nursing Home (Includes ICF) | Payer: Medicare Other | Source: Skilled Nursing Facility | Attending: Family Medicine | Admitting: Family Medicine

## 2022-09-01 ENCOUNTER — Encounter (HOSPITAL_COMMUNITY): Payer: Self-pay

## 2022-09-01 DIAGNOSIS — E0590 Thyrotoxicosis, unspecified without thyrotoxic crisis or storm: Secondary | ICD-10-CM | POA: Diagnosis present

## 2022-09-01 DIAGNOSIS — I5021 Acute systolic (congestive) heart failure: Secondary | ICD-10-CM | POA: Diagnosis not present

## 2022-09-01 DIAGNOSIS — Z7189 Other specified counseling: Secondary | ICD-10-CM | POA: Diagnosis not present

## 2022-09-01 DIAGNOSIS — K219 Gastro-esophageal reflux disease without esophagitis: Secondary | ICD-10-CM | POA: Diagnosis not present

## 2022-09-01 DIAGNOSIS — Z79899 Other long term (current) drug therapy: Secondary | ICD-10-CM | POA: Diagnosis not present

## 2022-09-01 DIAGNOSIS — M8580 Other specified disorders of bone density and structure, unspecified site: Secondary | ICD-10-CM | POA: Diagnosis not present

## 2022-09-01 DIAGNOSIS — R6889 Other general symptoms and signs: Secondary | ICD-10-CM | POA: Diagnosis not present

## 2022-09-01 DIAGNOSIS — E871 Hypo-osmolality and hyponatremia: Secondary | ICD-10-CM | POA: Diagnosis present

## 2022-09-01 DIAGNOSIS — Z8249 Family history of ischemic heart disease and other diseases of the circulatory system: Secondary | ICD-10-CM

## 2022-09-01 DIAGNOSIS — H9193 Unspecified hearing loss, bilateral: Secondary | ICD-10-CM | POA: Diagnosis present

## 2022-09-01 DIAGNOSIS — I480 Paroxysmal atrial fibrillation: Secondary | ICD-10-CM | POA: Insufficient documentation

## 2022-09-01 DIAGNOSIS — Z6824 Body mass index (BMI) 24.0-24.9, adult: Secondary | ICD-10-CM

## 2022-09-01 DIAGNOSIS — R531 Weakness: Secondary | ICD-10-CM | POA: Diagnosis not present

## 2022-09-01 DIAGNOSIS — R945 Abnormal results of liver function studies: Secondary | ICD-10-CM | POA: Diagnosis not present

## 2022-09-01 DIAGNOSIS — N179 Acute kidney failure, unspecified: Secondary | ICD-10-CM | POA: Diagnosis present

## 2022-09-01 DIAGNOSIS — Z79890 Hormone replacement therapy (postmenopausal): Secondary | ICD-10-CM | POA: Diagnosis not present

## 2022-09-01 DIAGNOSIS — K222 Esophageal obstruction: Secondary | ICD-10-CM | POA: Diagnosis not present

## 2022-09-01 DIAGNOSIS — Z87891 Personal history of nicotine dependence: Secondary | ICD-10-CM | POA: Diagnosis not present

## 2022-09-01 DIAGNOSIS — J90 Pleural effusion, not elsewhere classified: Secondary | ICD-10-CM | POA: Diagnosis not present

## 2022-09-01 DIAGNOSIS — R64 Cachexia: Secondary | ICD-10-CM | POA: Diagnosis not present

## 2022-09-01 DIAGNOSIS — Z66 Do not resuscitate: Secondary | ICD-10-CM | POA: Diagnosis not present

## 2022-09-01 DIAGNOSIS — R059 Cough, unspecified: Secondary | ICD-10-CM | POA: Diagnosis not present

## 2022-09-01 DIAGNOSIS — R1310 Dysphagia, unspecified: Secondary | ICD-10-CM | POA: Diagnosis not present

## 2022-09-01 DIAGNOSIS — I130 Hypertensive heart and chronic kidney disease with heart failure and stage 1 through stage 4 chronic kidney disease, or unspecified chronic kidney disease: Secondary | ICD-10-CM | POA: Diagnosis not present

## 2022-09-01 DIAGNOSIS — Z8051 Family history of malignant neoplasm of kidney: Secondary | ICD-10-CM | POA: Diagnosis not present

## 2022-09-01 DIAGNOSIS — Z515 Encounter for palliative care: Secondary | ICD-10-CM

## 2022-09-01 DIAGNOSIS — Z7401 Bed confinement status: Secondary | ICD-10-CM | POA: Diagnosis not present

## 2022-09-01 DIAGNOSIS — Z1152 Encounter for screening for COVID-19: Secondary | ICD-10-CM

## 2022-09-01 DIAGNOSIS — Z743 Need for continuous supervision: Secondary | ICD-10-CM | POA: Diagnosis not present

## 2022-09-01 DIAGNOSIS — J690 Pneumonitis due to inhalation of food and vomit: Secondary | ICD-10-CM | POA: Diagnosis not present

## 2022-09-01 DIAGNOSIS — Z853 Personal history of malignant neoplasm of breast: Secondary | ICD-10-CM | POA: Diagnosis not present

## 2022-09-01 DIAGNOSIS — I5032 Chronic diastolic (congestive) heart failure: Secondary | ICD-10-CM | POA: Diagnosis not present

## 2022-09-01 DIAGNOSIS — E860 Dehydration: Secondary | ICD-10-CM | POA: Diagnosis present

## 2022-09-01 DIAGNOSIS — Z833 Family history of diabetes mellitus: Secondary | ICD-10-CM | POA: Diagnosis not present

## 2022-09-01 DIAGNOSIS — E872 Acidosis, unspecified: Secondary | ICD-10-CM | POA: Diagnosis present

## 2022-09-01 DIAGNOSIS — N184 Chronic kidney disease, stage 4 (severe): Secondary | ICD-10-CM | POA: Diagnosis not present

## 2022-09-01 DIAGNOSIS — R0602 Shortness of breath: Secondary | ICD-10-CM | POA: Diagnosis not present

## 2022-09-01 DIAGNOSIS — J189 Pneumonia, unspecified organism: Secondary | ICD-10-CM | POA: Diagnosis not present

## 2022-09-01 DIAGNOSIS — K760 Fatty (change of) liver, not elsewhere classified: Secondary | ICD-10-CM | POA: Diagnosis not present

## 2022-09-01 DIAGNOSIS — I4821 Permanent atrial fibrillation: Secondary | ICD-10-CM | POA: Diagnosis present

## 2022-09-01 DIAGNOSIS — R7401 Elevation of levels of liver transaminase levels: Secondary | ICD-10-CM

## 2022-09-01 DIAGNOSIS — E039 Hypothyroidism, unspecified: Secondary | ICD-10-CM | POA: Diagnosis present

## 2022-09-01 DIAGNOSIS — I10 Essential (primary) hypertension: Secondary | ICD-10-CM | POA: Insufficient documentation

## 2022-09-01 DIAGNOSIS — K449 Diaphragmatic hernia without obstruction or gangrene: Secondary | ICD-10-CM | POA: Diagnosis not present

## 2022-09-01 DIAGNOSIS — K224 Dyskinesia of esophagus: Secondary | ICD-10-CM | POA: Diagnosis not present

## 2022-09-01 LAB — COMPREHENSIVE METABOLIC PANEL
ALT: 285 U/L — ABNORMAL HIGH (ref 0–44)
AST: 314 U/L — ABNORMAL HIGH (ref 15–41)
Albumin: 2.8 g/dL — ABNORMAL LOW (ref 3.5–5.0)
Alkaline Phosphatase: 165 U/L — ABNORMAL HIGH (ref 38–126)
Anion gap: 9 (ref 5–15)
BUN: 27 mg/dL — ABNORMAL HIGH (ref 8–23)
CO2: 26 mmol/L (ref 22–32)
Calcium: 8.3 mg/dL — ABNORMAL LOW (ref 8.9–10.3)
Chloride: 92 mmol/L — ABNORMAL LOW (ref 98–111)
Creatinine, Ser: 1.61 mg/dL — ABNORMAL HIGH (ref 0.44–1.00)
GFR, Estimated: 29 mL/min — ABNORMAL LOW (ref >60)
Glucose, Bld: 156 mg/dL — ABNORMAL HIGH (ref 70–99)
Potassium: 4 mmol/L (ref 3.5–5.1)
Sodium: 127 mmol/L — ABNORMAL LOW (ref 135–145)
Total Bilirubin: 0.8 mg/dL (ref 0.3–1.2)
Total Protein: 6.1 g/dL — ABNORMAL LOW (ref 6.5–8.1)

## 2022-09-01 LAB — DIFFERENTIAL
Abs Immature Granulocytes: 0.09 10*3/uL — ABNORMAL HIGH (ref 0.00–0.07)
Basophils Absolute: 0.1 10*3/uL (ref 0.0–0.1)
Basophils Relative: 1 %
Eosinophils Absolute: 0.3 10*3/uL (ref 0.0–0.5)
Eosinophils Relative: 4 %
Immature Granulocytes: 1 %
Lymphocytes Relative: 11 %
Lymphs Abs: 0.8 10*3/uL (ref 0.7–4.0)
Monocytes Absolute: 0.8 10*3/uL (ref 0.1–1.0)
Monocytes Relative: 12 %
Neutro Abs: 5.1 10*3/uL (ref 1.7–7.7)
Neutrophils Relative %: 71 %

## 2022-09-01 LAB — RESP PANEL BY RT-PCR (RSV, FLU A&B, COVID)  RVPGX2
Influenza A by PCR: NEGATIVE
Influenza B by PCR: NEGATIVE
Resp Syncytial Virus by PCR: NEGATIVE
SARS Coronavirus 2 by RT PCR: NEGATIVE

## 2022-09-01 LAB — CBC
HCT: 38.4 % (ref 36.0–46.0)
Hemoglobin: 12.6 g/dL (ref 12.0–15.0)
MCH: 31.6 pg (ref 26.0–34.0)
MCHC: 32.8 g/dL (ref 30.0–36.0)
MCV: 96.2 fL (ref 80.0–100.0)
Platelets: 173 10*3/uL (ref 150–400)
RBC: 3.99 MIL/uL (ref 3.87–5.11)
RDW: 14.2 % (ref 11.5–15.5)
WBC: 7.5 10*3/uL (ref 4.0–10.5)
nRBC: 0 % (ref 0.0–0.2)

## 2022-09-01 LAB — TROPONIN I (HIGH SENSITIVITY)
Troponin I (High Sensitivity): 8 ng/L (ref <18)
Troponin I (High Sensitivity): 8 ng/L (ref <18)

## 2022-09-01 LAB — LACTIC ACID, PLASMA: Lactic Acid, Venous: 2.1 mmol/L (ref 0.5–1.9)

## 2022-09-01 MED ORDER — SODIUM CHLORIDE 0.9 % IV SOLN
500.0000 mg | Freq: Once | INTRAVENOUS | Status: AC
  Administered 2022-09-02: 500 mg via INTRAVENOUS
  Filled 2022-09-01: qty 5

## 2022-09-01 MED ORDER — VANCOMYCIN HCL 1250 MG/250ML IV SOLN
1250.0000 mg | Freq: Once | INTRAVENOUS | Status: AC
  Administered 2022-09-01: 1250 mg via INTRAVENOUS
  Filled 2022-09-01: qty 250

## 2022-09-01 MED ORDER — SODIUM CHLORIDE 0.9 % IV BOLUS
1000.0000 mL | Freq: Once | INTRAVENOUS | Status: AC
  Administered 2022-09-01: 1000 mL via INTRAVENOUS

## 2022-09-01 MED ORDER — SODIUM CHLORIDE 0.9 % IV SOLN
2.0000 g | Freq: Once | INTRAVENOUS | Status: AC
  Administered 2022-09-01: 2 g via INTRAVENOUS
  Filled 2022-09-01: qty 12.5

## 2022-09-01 NOTE — ED Provider Notes (Signed)
[place] EMERGENCY DEPARTMENT AT [facility] Provider Note   CSN: [id number] Arrival date & time: 09/01/22  1914     History {Add pertinent medical, surgical, social history, OB history to HPI:1} Chief Complaint  Patient presents with  . SOB  PNA    Cassandra Ray is a 89 y.o. female.  HPI     89yo female with history of breast cancer, diastolic CHF, htn, hlpd, hypothyroidism, paroxysmal atrial fibrillation,   3/11 pneumonia vaccine 3/17 began having cough, initially seen at [facility], supportive care at first, cough, chest congestion, decreased appetite 3/22ish seen again and started on augmentin Congested sounding cough, was feelin gsome shortness of breath, smothering feeling but O2 helping that was started today Dry mouth, not eating or drinking well No chest pain No abdominal pain Nausea, a few episodes of vomiting     Past Medical History:  Diagnosis Date  . Actinic keratoses   . Arthritis    back   . Breast cancer ([place])    Breast cancer (right)  . Chronic diastolic CHF (congestive heart failure) ([place])   . CKD (chronic kidney disease), stage III ([place])   . Compression fracture of L3 lumbar vertebra   . Dysphagia   . GERD (gastroesophageal reflux disease)    takes otc Pepcid as needed  . Hiatal hernia 11/27/2014  . HTN (hypertension)   . Hypothyroidism   . Insomnia   . Osteopenia   . PAF (paroxysmal atrial fibrillation) ([place])    a. questionable episode in 2015 in [facility]. b. event monitor through December/January 2014/2015 which showed no evidence of atrial fibrillation. c. Dx 12/2018.  Marland Kitchen PAT (paroxysmal atrial tachycardia)   . Pleural effusion     Home Medications Prior to Admission medications   Medication Sig Start Date End Date Taking? Authorizing Provider  acetaminophen (TYLENOL) 500 MG tablet Take 2 tablets (1,000 mg total) by mouth every 8 (eight) hours. 01/21/21   Royal Hawthorn, NP  amiodarone (PACERONE) 200 MG tablet Take 1 tablet (200  mg total) by mouth daily. Please keep upcoming appt for future refills 07/29/21   Jerline Pain, MD  B Complex-C (B-COMPLEX WITH VITAMIN C) tablet Take 1 tablet by mouth daily.    [provider]  Cholecalciferol (VITAMIN D3) 2000 units TABS Take 2,000 Units by mouth daily.     [provider]  furosemide (LASIX) 20 MG tablet Take 1 tablet (20 mg total) by mouth daily. 08/09/21   Jerline Pain, MD  ipratropium (ATROVENT) 0.03 % nasal spray Place 2 sprays into both nostrils 2 (two) times daily.    [provider]  levothyroxine (SYNTHROID) 100 MCG tablet Take 100 mcg by mouth every morning. 07/01/20   [provider]  lidocaine (LIDODERM) 5 % Place 1 patch onto the skin in the morning and at bedtime. Remove & Discard patch within 12 hours or as directed by MD Patient not taking: Reported on 08/09/2021    [provider]  loperamide (IMODIUM A-D) 2 MG tablet Take 2 mg by mouth daily as needed for diarrhea or loose stools.     [provider]  melatonin 5 MG TABS Take 5 mg by mouth at bedtime as needed.    [provider]  methocarbamol (ROBAXIN) 500 MG tablet Take 250 mg by mouth 2 (two) times daily as needed for muscle spasms.    [provider]  metoprolol tartrate (LOPRESSOR) 25 MG tablet Take 12.5 mg by mouth 2 (two) times  daily.    [provider]  nitroGLYCERIN (NITROSTAT) 0.4 MG SL tablet Place 1 tablet (0.4 mg total) under the tongue every 5 (five) minutes as needed for chest pain. 01/05/20   Jerline Pain, MD  ondansetron (ZOFRAN) 4 MG tablet Take 4 mg by mouth every 6 (six) hours as needed for nausea or vomiting.    [provider]  pantoprazole (PROTONIX) 40 MG tablet Take 1 tablet (40 mg total) by mouth daily. 01/11/19   Kayleen Memos, DO  polyethylene glycol powder (GLYCOLAX/MIRALAX) 17 GM/SCOOP powder Take 17 g by mouth daily. 01/21/21   Royal Hawthorn, NP  Probiotic Product (ALIGN) 4 MG CAPS Take 4 mg by  mouth daily.    [provider]  sennosides-docusate sodium (SENOKOT-S) 8.6-50 MG tablet Take 1 tablet by mouth at bedtime as needed for constipation. 01/21/21   Royal Hawthorn, NP  traMADol (ULTRAM) 50 MG tablet Take 25 mg by mouth every 6 (six) hours as needed.    [provider]      Allergies    Cardizem [diltiazem], Eliquis [apixaban], and Tape    Review of Systems   Review of Systems  Physical Exam Updated Vital Signs BP 122/76 (BP Location: Right Arm)   Pulse 78   Temp 98 F (36.7 C) (Oral)   Resp 20   Ht 5' 3.8" (1.621 m)   Wt 64.4 kg   LMP  (LMP Unknown)   SpO2 98% Comment: Simultaneous filing. User may not have seen previous data.  BMI 24.53 kg/m  Physical Exam  ED Results / Procedures / Treatments   Labs (all labs ordered are listed, but only abnormal results are displayed) Labs Reviewed  COMPREHENSIVE METABOLIC PANEL - Abnormal; Notable for the following components:      Result Value   Sodium 127 (*)    Chloride 92 (*)    Glucose, Bld 156 (*)    BUN 27 (*)    Creatinine, Ser 1.61 (*)    Calcium 8.3 (*)    Total Protein 6.1 (*)    Albumin 2.8 (*)    AST 314 (*)    ALT 285 (*)    Alkaline Phosphatase 165 (*)    GFR, Estimated 29 (*)    All other components within normal limits  RESP PANEL BY RT-PCR (RSV, FLU A&B, COVID)  RVPGX2  CBC  TROPONIN I (HIGH SENSITIVITY)    EKG None  Radiology DG Chest Port 1 View  Result Date: 09/01/2022 CLINICAL DATA:  Short of breath, pneumonia EXAM: PORTABLE CHEST 1 VIEW COMPARISON:  01/08/2019 FINDINGS: Single frontal view of the chest demonstrates a stable cardiac silhouette. There is diffuse interstitial prominence throughout the lungs, with slight progression since prior study. Increased density at the left lateral lung base with loss of the margins of the left hemidiaphragm could reflect left lower lobe pneumonia. No large effusion or pneumothorax. No acute bony abnormality. IMPRESSION: 1. Increased  density left lateral lung base which could reflect left lower lobe pneumonia. 2. Progressive interstitial prominence throughout the lungs, which could reflect progressive scarring or superimposed reactive airway disease. Electronically Signed   By: Randa Ngo M.D.   On: 09/01/2022 20:30    Procedures Procedures  {Document cardiac monitor, telemetry assessment procedure when appropriate:1}  Medications Ordered in ED Medications - No data to display  ED Course/ Medical Decision Making/ A&P   {   Click here for ABCD2, HEART and other calculatorsREFRESH Note before signing :1}  Medical Decision Making Amount and/or Complexity of Data Reviewed Labs: ordered. Radiology: ordered.  Risk Prescription drug management.   ***  {Document critical care time when appropriate:1} {Document review of labs and clinical decision tools ie heart score, Chads2Vasc2 etc:1}  {Document your independent review of radiology images, and any outside records:1} {Document your discussion with family members, caretakers, and with consultants:1} {Document social determinants of health affecting pt's care:1} {Document your decision making why or why not admission, treatments were needed:1} Final Clinical Impression(s) / ED Diagnoses Final diagnoses:  None    Rx / DC Orders ED Discharge Orders     None

## 2022-09-01 NOTE — Progress Notes (Signed)
A consult was received from an ED physician for cefepime & vancomycin per pharmacy dosing.  The patient's profile has been reviewed for ht/wt/allergies/indication/available labs.    A one time order has been placed for cefepime 2 gm & vancomycin 1250 mg.    Further antibiotics/pharmacy consults should be ordered by admitting physician if indicated.                       Thank you,  Eudelia Bunch, Pharm.D Use secure chat for questions 09/01/2022 9:18 PM

## 2022-09-01 NOTE — ED Triage Notes (Signed)
Pt from [facility] BIB [facility] for PNA, dx with PNA a week ago, finished last dose of Augmentin today, facility believes pt has not improved. PCP prescribe supplemental oxygen today at 2L [place]. Schedule NEBS Q 4hrs, rhonchi throughout, sats 98% on 2L [place], VSS, A&O at baseline. Afebrile.

## 2022-09-01 NOTE — ED Provider Triage Note (Signed)
Emergency Medicine Provider Triage Evaluation Note  Cassandra Ray , a 89 y.o. female  was evaluated in triage.  Pt complains of shortness of breath. Was recently diagnosed with pneumonia and had been placed on oral antibiotics. Apparently has had worsening shortness of breath, new oxygen requirement.   Review of Systems  Positive: See above Negative:   Physical Exam  BP 122/76 (BP Location: Right Arm)   Pulse 78   Temp 98 F (36.7 C) (Oral)   Resp 20   Ht 5' 3.8" (1.621 m)   Wt 64.4 kg   LMP  (LMP Unknown)   SpO2 98% Comment: Simultaneous filing. User may not have seen previous data.  BMI 24.53 kg/m  Gen:   Awake, alert Resp:  Tachypneic, rhonchi and rales  MSK:   Moves extremities without difficulty  Other:    Medical Decision Making  Medically screening exam initiated at 7:49 PM.  Appropriate orders placed.  Cassandra Ray was informed that the remainder of the evaluation will be completed by another provider, this initial triage assessment does not replace that evaluation, and the importance of remaining in the ED until their evaluation is complete.     Cassandra Hillier, PA-C 09/01/22 [phone]

## 2022-09-02 ENCOUNTER — Observation Stay (HOSPITAL_COMMUNITY): Payer: Medicare Other

## 2022-09-02 DIAGNOSIS — Z79890 Hormone replacement therapy (postmenopausal): Secondary | ICD-10-CM | POA: Diagnosis not present

## 2022-09-02 DIAGNOSIS — K222 Esophageal obstruction: Secondary | ICD-10-CM | POA: Diagnosis present

## 2022-09-02 DIAGNOSIS — R64 Cachexia: Secondary | ICD-10-CM | POA: Diagnosis present

## 2022-09-02 DIAGNOSIS — Z8249 Family history of ischemic heart disease and other diseases of the circulatory system: Secondary | ICD-10-CM | POA: Diagnosis not present

## 2022-09-02 DIAGNOSIS — E039 Hypothyroidism, unspecified: Secondary | ICD-10-CM | POA: Diagnosis present

## 2022-09-02 DIAGNOSIS — Z8051 Family history of malignant neoplasm of kidney: Secondary | ICD-10-CM | POA: Diagnosis not present

## 2022-09-02 DIAGNOSIS — I5021 Acute systolic (congestive) heart failure: Secondary | ICD-10-CM | POA: Diagnosis not present

## 2022-09-02 DIAGNOSIS — I130 Hypertensive heart and chronic kidney disease with heart failure and stage 1 through stage 4 chronic kidney disease, or unspecified chronic kidney disease: Secondary | ICD-10-CM | POA: Diagnosis present

## 2022-09-02 DIAGNOSIS — Z515 Encounter for palliative care: Secondary | ICD-10-CM | POA: Diagnosis not present

## 2022-09-02 DIAGNOSIS — R531 Weakness: Secondary | ICD-10-CM | POA: Diagnosis not present

## 2022-09-02 DIAGNOSIS — J690 Pneumonitis due to inhalation of food and vomit: Secondary | ICD-10-CM | POA: Diagnosis present

## 2022-09-02 DIAGNOSIS — E0590 Thyrotoxicosis, unspecified without thyrotoxic crisis or storm: Secondary | ICD-10-CM | POA: Diagnosis present

## 2022-09-02 DIAGNOSIS — Z853 Personal history of malignant neoplasm of breast: Secondary | ICD-10-CM | POA: Diagnosis not present

## 2022-09-02 DIAGNOSIS — R945 Abnormal results of liver function studies: Secondary | ICD-10-CM | POA: Diagnosis not present

## 2022-09-02 DIAGNOSIS — K760 Fatty (change of) liver, not elsewhere classified: Secondary | ICD-10-CM | POA: Diagnosis not present

## 2022-09-02 DIAGNOSIS — R7401 Elevation of levels of liver transaminase levels: Secondary | ICD-10-CM

## 2022-09-02 DIAGNOSIS — E872 Acidosis, unspecified: Secondary | ICD-10-CM | POA: Diagnosis present

## 2022-09-02 DIAGNOSIS — E860 Dehydration: Secondary | ICD-10-CM | POA: Diagnosis present

## 2022-09-02 DIAGNOSIS — K219 Gastro-esophageal reflux disease without esophagitis: Secondary | ICD-10-CM | POA: Diagnosis present

## 2022-09-02 DIAGNOSIS — Z833 Family history of diabetes mellitus: Secondary | ICD-10-CM | POA: Diagnosis not present

## 2022-09-02 DIAGNOSIS — N184 Chronic kidney disease, stage 4 (severe): Secondary | ICD-10-CM | POA: Diagnosis present

## 2022-09-02 DIAGNOSIS — Z66 Do not resuscitate: Secondary | ICD-10-CM | POA: Diagnosis present

## 2022-09-02 DIAGNOSIS — J189 Pneumonia, unspecified organism: Secondary | ICD-10-CM | POA: Diagnosis not present

## 2022-09-02 DIAGNOSIS — M8580 Other specified disorders of bone density and structure, unspecified site: Secondary | ICD-10-CM | POA: Diagnosis present

## 2022-09-02 DIAGNOSIS — Z1152 Encounter for screening for COVID-19: Secondary | ICD-10-CM | POA: Diagnosis not present

## 2022-09-02 DIAGNOSIS — Z7189 Other specified counseling: Secondary | ICD-10-CM | POA: Diagnosis not present

## 2022-09-02 DIAGNOSIS — Z79899 Other long term (current) drug therapy: Secondary | ICD-10-CM | POA: Diagnosis not present

## 2022-09-02 DIAGNOSIS — E871 Hypo-osmolality and hyponatremia: Secondary | ICD-10-CM | POA: Diagnosis present

## 2022-09-02 DIAGNOSIS — Z87891 Personal history of nicotine dependence: Secondary | ICD-10-CM | POA: Diagnosis not present

## 2022-09-02 DIAGNOSIS — I5032 Chronic diastolic (congestive) heart failure: Secondary | ICD-10-CM | POA: Diagnosis present

## 2022-09-02 DIAGNOSIS — N179 Acute kidney failure, unspecified: Secondary | ICD-10-CM | POA: Diagnosis present

## 2022-09-02 DIAGNOSIS — I4821 Permanent atrial fibrillation: Secondary | ICD-10-CM | POA: Diagnosis present

## 2022-09-02 LAB — LACTIC ACID, PLASMA: Lactic Acid, Venous: 1.2 mmol/L (ref 0.5–1.9)

## 2022-09-02 LAB — HEPATIC FUNCTION PANEL
ALT: 286 U/L — ABNORMAL HIGH (ref 0–44)
AST: 329 U/L — ABNORMAL HIGH (ref 15–41)
Albumin: 2.9 g/dL — ABNORMAL LOW (ref 3.5–5.0)
Alkaline Phosphatase: 166 U/L — ABNORMAL HIGH (ref 38–126)
Bilirubin, Direct: 0.3 mg/dL — ABNORMAL HIGH (ref 0.0–0.2)
Indirect Bilirubin: 0.5 mg/dL (ref 0.3–0.9)
Total Bilirubin: 0.8 mg/dL (ref 0.3–1.2)
Total Protein: 6.4 g/dL — ABNORMAL LOW (ref 6.5–8.1)

## 2022-09-02 LAB — BASIC METABOLIC PANEL
Anion gap: 8 (ref 5–15)
BUN: 25 mg/dL — ABNORMAL HIGH (ref 8–23)
CO2: 24 mmol/L (ref 22–32)
Calcium: 8.1 mg/dL — ABNORMAL LOW (ref 8.9–10.3)
Chloride: 101 mmol/L (ref 98–111)
Creatinine, Ser: 1.4 mg/dL — ABNORMAL HIGH (ref 0.44–1.00)
GFR, Estimated: 34 mL/min — ABNORMAL LOW (ref >60)
Glucose, Bld: 85 mg/dL (ref 70–99)
Potassium: 4 mmol/L (ref 3.5–5.1)
Sodium: 133 mmol/L — ABNORMAL LOW (ref 135–145)

## 2022-09-02 LAB — HEPATITIS PANEL, ACUTE
HCV Ab: NONREACTIVE
Hep A IgM: NONREACTIVE
Hep B C IgM: NONREACTIVE
Hepatitis B Surface Ag: NONREACTIVE

## 2022-09-02 LAB — PROTIME-INR
INR: 1.2 (ref 0.8–1.2)
Prothrombin Time: 15.1 seconds (ref 11.4–15.2)

## 2022-09-02 LAB — CBC
HCT: 36.1 % (ref 36.0–46.0)
Hemoglobin: 12.1 g/dL (ref 12.0–15.0)
MCH: 31.8 pg (ref 26.0–34.0)
MCHC: 33.5 g/dL (ref 30.0–36.0)
MCV: 95 fL (ref 80.0–100.0)
Platelets: 194 10*3/uL (ref 150–400)
RBC: 3.8 MIL/uL — ABNORMAL LOW (ref 3.87–5.11)
RDW: 14.2 % (ref 11.5–15.5)
WBC: 8.7 10*3/uL (ref 4.0–10.5)
nRBC: 0 % (ref 0.0–0.2)

## 2022-09-02 LAB — STREP PNEUMONIAE URINARY ANTIGEN: Strep Pneumo Urinary Antigen: NEGATIVE

## 2022-09-02 LAB — TSH: TSH: 0.067 u[IU]/mL — ABNORMAL LOW (ref 0.350–4.500)

## 2022-09-02 LAB — BRAIN NATRIURETIC PEPTIDE: B Natriuretic Peptide: 212.2 pg/mL — ABNORMAL HIGH (ref 0.0–100.0)

## 2022-09-02 LAB — OSMOLALITY: Osmolality: 283 mOsm/kg (ref 275–295)

## 2022-09-02 MED ORDER — ONDANSETRON HCL 4 MG PO TABS
4.0000 mg | ORAL_TABLET | Freq: Four times a day (QID) | ORAL | Status: DC | PRN
  Administered 2022-09-02 – 2022-09-03 (×2): 4 mg via ORAL
  Filled 2022-09-02 (×2): qty 1

## 2022-09-02 MED ORDER — GUAIFENESIN ER 600 MG PO TB12
600.0000 mg | ORAL_TABLET | Freq: Two times a day (BID) | ORAL | Status: DC
  Administered 2022-09-02 – 2022-09-07 (×12): 600 mg via ORAL
  Filled 2022-09-02 (×12): qty 1

## 2022-09-02 MED ORDER — AMIODARONE HCL 200 MG PO TABS
100.0000 mg | ORAL_TABLET | Freq: Every day | ORAL | Status: DC
  Administered 2022-09-02: 100 mg via ORAL
  Filled 2022-09-02: qty 1

## 2022-09-02 MED ORDER — SODIUM CHLORIDE 0.9 % IV SOLN: INTRAVENOUS | Status: DC

## 2022-09-02 MED ORDER — POLYETHYLENE GLYCOL 3350 17 G PO PACK
17.0000 g | PACK | Freq: Every day | ORAL | Status: DC
  Administered 2022-09-02 – 2022-09-05 (×3): 17 g via ORAL
  Filled 2022-09-02 (×6): qty 1

## 2022-09-02 MED ORDER — METHOCARBAMOL 500 MG PO TABS
250.0000 mg | ORAL_TABLET | Freq: Two times a day (BID) | ORAL | Status: DC | PRN
  Administered 2022-09-06: 250 mg via ORAL
  Filled 2022-09-02 (×2): qty 1

## 2022-09-02 MED ORDER — PANTOPRAZOLE SODIUM 40 MG PO TBEC
40.0000 mg | DELAYED_RELEASE_TABLET | Freq: Every day | ORAL | Status: DC
  Administered 2022-09-02 – 2022-09-07 (×6): 40 mg via ORAL
  Filled 2022-09-02 (×6): qty 1

## 2022-09-02 MED ORDER — NITROGLYCERIN 0.4 MG SL SUBL: 0.4000 mg | SUBLINGUAL_TABLET | SUBLINGUAL | Status: DC | PRN

## 2022-09-02 MED ORDER — METOPROLOL TARTRATE 12.5 MG HALF TABLET
12.5000 mg | ORAL_TABLET | Freq: Two times a day (BID) | ORAL | Status: DC
  Administered 2022-09-02 – 2022-09-07 (×11): 12.5 mg via ORAL
  Filled 2022-09-02 (×11): qty 1

## 2022-09-02 MED ORDER — ACETAMINOPHEN 325 MG PO TABS: 650.0000 mg | ORAL_TABLET | Freq: Four times a day (QID) | ORAL | Status: DC | PRN

## 2022-09-02 MED ORDER — SODIUM CHLORIDE 0.9 % IV SOLN
500.0000 mg | INTRAVENOUS | Status: DC
  Administered 2022-09-02 – 2022-09-05 (×3): 500 mg via INTRAVENOUS
  Filled 2022-09-02 (×3): qty 5

## 2022-09-02 MED ORDER — AMIODARONE HCL 200 MG PO TABS: 100.0000 mg | ORAL_TABLET | Freq: Every day | ORAL | Status: DC

## 2022-09-02 MED ORDER — MELATONIN 5 MG PO TABS
5.0000 mg | ORAL_TABLET | Freq: Every evening | ORAL | Status: DC | PRN
  Administered 2022-09-05 – 2022-09-06 (×3): 5 mg via ORAL
  Filled 2022-09-02 (×3): qty 1

## 2022-09-02 MED ORDER — POLYETHYLENE GLYCOL 3350 17 GM/SCOOP PO POWD
17.0000 g | Freq: Every day | ORAL | Status: DC
  Filled 2022-09-02: qty 255

## 2022-09-02 MED ORDER — ACETAMINOPHEN 650 MG RE SUPP: 650.0000 mg | Freq: Four times a day (QID) | RECTAL | Status: DC | PRN

## 2022-09-02 MED ORDER — ALBUTEROL SULFATE (2.5 MG/3ML) 0.083% IN NEBU
2.5000 mg | INHALATION_SOLUTION | Freq: Four times a day (QID) | RESPIRATORY_TRACT | Status: DC | PRN
  Administered 2022-09-04 – 2022-09-07 (×7): 2.5 mg via RESPIRATORY_TRACT
  Filled 2022-09-02 (×7): qty 3

## 2022-09-02 MED ORDER — ACETAMINOPHEN 500 MG PO TABS
1000.0000 mg | ORAL_TABLET | Freq: Three times a day (TID) | ORAL | Status: DC
  Administered 2022-09-02 – 2022-09-07 (×15): 1000 mg via ORAL
  Filled 2022-09-02 (×15): qty 2

## 2022-09-02 MED ORDER — HEPARIN SODIUM (PORCINE) 5000 UNIT/ML IJ SOLN
5000.0000 [IU] | Freq: Three times a day (TID) | INTRAMUSCULAR | Status: DC
  Administered 2022-09-02 – 2022-09-07 (×8): 5000 [IU] via SUBCUTANEOUS
  Filled 2022-09-02 (×9): qty 1

## 2022-09-02 MED ORDER — AMIODARONE HCL 200 MG PO TABS
200.0000 mg | ORAL_TABLET | Freq: Every day | ORAL | Status: DC
  Filled 2022-09-02: qty 1

## 2022-09-02 MED ORDER — SODIUM CHLORIDE 0.9 % IV SOLN
1.0000 g | INTRAVENOUS | Status: DC
  Administered 2022-09-02 – 2022-09-04 (×3): 1 g via INTRAVENOUS
  Filled 2022-09-02 (×4): qty 10

## 2022-09-02 MED ORDER — LEVOTHYROXINE SODIUM 50 MCG PO TABS
50.0000 ug | ORAL_TABLET | Freq: Every day | ORAL | Status: DC
  Administered 2022-09-03 – 2022-09-07 (×5): 50 ug via ORAL
  Filled 2022-09-02 (×5): qty 1

## 2022-09-02 NOTE — Progress Notes (Addendum)
Patient seen and examined after midnight admission by Dr. Alfonso Patten  89 year old white female [facility] dwelling--semiindependent at baseline uses walker and wheelchair husband lives with her at age 43 and helps her mobilize Prior breast cancer, right-sided Permanent A-fib previously on Eliquis-stopped because of Cameron lesions on EGD 01/09/2019 and dark stool Hemoccult positive-follows with Dr. [name] of cardiology Hypothyroidism on Synthroid CKD 3B HFpEF EF 60-65% 12/2018 Uncontrolled hypertension previously  Presented from [facility] with outpatient failure pneumonia and in setting of having been seen 3/17 at [facility] primary care and starting on Augmentin she appears to have finished her last dose of Augmentin on 3/29-day of arrival at [facility] and had been placed on supplemental oxygen in the outpatient setting-  Seen by admitting physician started on appropriate ceftriaxone vancomycin-lactic acidosis seems to have cleared Note hyponatremia which is resolving Also note transaminitis with LFTs in the 300 range-last ones being normal back in 09/2020--- ultrasound performed showed only fatty liver  I see that patient is on amiodarone which can cause liver and thyroid dyscrasias--I have cut back amiodarone to 100 daily from down from 200 given her advanced age BMI of 82 and I will keep on telemetry and see how she does--we will repeat LFTs in the morning to denote whether this is trending downwards-low threshold to involve cardiology/GI but at age 49 unlikely would aggressively workup   She is confirmed DNR--no CPR no Intubation I had a long discussion with he 65 yr ld husband and her son at the bedside and they understand the expected nature of disease is probably improvement, but that Pneumonia can be serious in elderly patients--they both do understand that I expect her to recover but that this will take several days  Verneita Griffes, MD [facility] 10:56 AM

## 2022-09-02 NOTE — H&P (Signed)
History and Physical    Cassandra Ray [id number] DOB: 1923-06-03 DOA: 09/01/2022  PCP: Lawerance Cruel, MD  Patient coming from: SNF  Chief Complaint: Shortness of breath  HPI: Cassandra Ray is a 89 y.o. female with medical history significant of paroxysmal A-fib not on anticoagulation due to history of GI bleed, chronic HFpEF, CKD stage IV, GERD, hypertension, hypothyroidism, breast cancer status post right breast lumpectomy in 1995 presented to ED from SNF for evaluation of shortness of breath.  She was diagnosed with pneumonia a week ago and finished treatment with Augmentin today but has not improved.  She was placed on 2 L supplemental oxygen at her facility but was satting well on room air in the ED and did not require supplemental oxygen.  Afebrile.  Labs showing no leukocytosis, sodium 127, chloride 92, creatinine 1.6 (at baseline), AST 314, ALT 285, alk phos 165, T. bili 0.8, troponin negative x 2, COVID/influenza/RSV PCR negative, blood cultures drawn, lactic acid 2.1> 1.2.  Chest x-ray showing left lower lobe pneumonia.  EKG showing sinus rhythm and no acute ischemic changes. Patient received vancomycin, cefepime, azithromycin, and 1 L normal saline bolus.  [facility] called to admit.  Patient is reporting shortness of breath, cough, and generalized weakness.  States she was diagnosed with pneumonia at her facility and was treated with an antibiotic for a week which she finished yesterday but her symptoms have not improved and they sent her to the ED to be evaluated.  She denies fevers, chills, or chest pain.  Reports some mild diarrhea related to antibiotic use which has resolved.  Denies nausea, vomiting, or abdominal pain.  States her appetite is "very poor."  Review of Systems:  Review of Systems  All other systems reviewed and are negative.   Past Medical History:  Diagnosis Date   Actinic keratoses    Arthritis    back    Breast cancer ([place])    Breast cancer (right)    Chronic diastolic CHF (congestive heart failure) ([facility])    CKD (chronic kidney disease), stage III ([facility])    Compression fracture of L3 lumbar vertebra    Dysphagia    GERD (gastroesophageal reflux disease)    takes otc Pepcid as needed   Hiatal hernia 11/27/2014   HTN (hypertension)    Hypothyroidism    Insomnia    Osteopenia    PAF (paroxysmal atrial fibrillation) ([place])    a. questionable episode in 2015 in [place]. b. event monitor through December/January 2014/2015 which showed no evidence of atrial fibrillation. c. Dx 12/2018.   PAT (paroxysmal atrial tachycardia)    Pleural effusion     Past Surgical History:  Procedure Laterality Date   BREAST LUMPECTOMY Right 1995   lumpectomy   COLONOSCOPY     ESOPHAGOGASTRODUODENOSCOPY (EGD) WITH PROPOFOL Left 01/09/2019   Procedure: ESOPHAGOGASTRODUODENOSCOPY (EGD) WITH PROPOFOL;  Surgeon: Carol Ada, MD;  Location: [facility];  Service: Endoscopy;  Laterality: Left;   EYE SURGERY Bilateral    cataract w/ lens implant   KYPHOPLASTY N/A 09/18/2013   Procedure: KYPHOPLASTY;  Surgeon: Sinclair Ship, MD;  Location: [facility];  Service: Orthopedics;  Laterality: N/A;  Lumbar 3 kyphoplasty   TONSILLECTOMY       reports that she has quit smoking. She has never used smokeless tobacco. She reports current alcohol use of about 7.0 standard drinks of alcohol per week. She reports that she does not use drugs.  Allergies  Allergen Reactions   Cardizem [Diltiazem]  Eliquis [Apixaban]    Tape Other (See Comments)    Patient prefers easy-release tape    Family History  Problem Relation Age of Onset   CAD Mother    Diabetes Mellitus II Mother    Heart failure Mother    Heart failure Father    Prostate cancer Brother    Kidney cancer Brother    Heart disease Sister    Esophageal cancer Neg Hx    Colon cancer Neg Hx     Prior to Admission medications   Medication Sig Start Date End Date Taking? Authorizing Provider  acetaminophen  (TYLENOL) 500 MG tablet Take 2 tablets (1,000 mg total) by mouth every 8 (eight) hours. 01/21/21   Royal Hawthorn, NP  amiodarone (PACERONE) 200 MG tablet Take 1 tablet (200 mg total) by mouth daily. Please keep upcoming appt for future refills 07/29/21   Jerline Pain, MD  B Complex-C (B-COMPLEX WITH VITAMIN C) tablet Take 1 tablet by mouth daily.    [provider]  Cholecalciferol (VITAMIN D3) 2000 units TABS Take 2,000 Units by mouth daily.     [provider]  furosemide (LASIX) 20 MG tablet Take 1 tablet (20 mg total) by mouth daily. 08/09/21   Jerline Pain, MD  ipratropium (ATROVENT) 0.03 % nasal spray Place 2 sprays into both nostrils 2 (two) times daily.    [provider]  levothyroxine (SYNTHROID) 100 MCG tablet Take 100 mcg by mouth every morning. 07/01/20   [provider]  lidocaine (LIDODERM) 5 % Place 1 patch onto the skin in the morning and at bedtime. Remove & Discard patch within 12 hours or as directed by MD Patient not taking: Reported on 08/09/2021    [provider]  loperamide (IMODIUM A-D) 2 MG tablet Take 2 mg by mouth daily as needed for diarrhea or loose stools.     [provider]  melatonin 5 MG TABS Take 5 mg by mouth at bedtime as needed.    [provider]  methocarbamol (ROBAXIN) 500 MG tablet Take 250 mg by mouth 2 (two) times daily as needed for muscle spasms.    [provider]  metoprolol tartrate (LOPRESSOR) 25 MG tablet Take 12.5 mg by mouth 2 (two) times daily.    [provider]  nitroGLYCERIN (NITROSTAT) 0.4 MG SL tablet Place 1 tablet (0.4 mg total) under the tongue every 5 (five) minutes as needed for chest pain. 01/05/20   Jerline Pain, MD  ondansetron (ZOFRAN) 4 MG tablet Take 4 mg by mouth every 6 (six) hours as needed for nausea or vomiting.    [provider]  pantoprazole (PROTONIX) 40 MG tablet Take 1 tablet (40 mg total) by mouth daily. 01/11/19   Kayleen Memos, DO   polyethylene glycol powder (GLYCOLAX/MIRALAX) 17 GM/SCOOP powder Take 17 g by mouth daily. 01/21/21   Royal Hawthorn, NP  Probiotic Product (ALIGN) 4 MG CAPS Take 4 mg by mouth daily.    [provider]  sennosides-docusate sodium (SENOKOT-S) 8.6-50 MG tablet Take 1 tablet by mouth at bedtime as needed for constipation. 01/21/21   Royal Hawthorn, NP  traMADol (ULTRAM) 50 MG tablet Take 25 mg by mouth every 6 (six) hours as needed.    [provider]    Physical Exam: Vitals:   09/01/22 1924 09/01/22 1925 09/01/22 2230 09/01/22 2348  BP: 122/76  132/80   Pulse: 78  63   Resp: 20  19   Temp: 98  F (36.7 C)   97.7 F (36.5 C)  TempSrc: Oral   Oral  SpO2: 98%  95%   Weight:  64.4 kg    Height:  5' 3.8" (1.621 m)      Physical Exam Vitals reviewed.  Constitutional:      General: She is not in acute distress. HENT:     Head: Normocephalic and atraumatic.     Mouth/Throat:     Mouth: Mucous membranes are dry.  Eyes:     Extraocular Movements: Extraocular movements intact.  Cardiovascular:     Rate and Rhythm: Normal rate and regular rhythm.     Pulses: Normal pulses.  Pulmonary:     Effort: Pulmonary effort is normal. No respiratory distress.     Breath sounds: Rhonchi present. No rales.  Abdominal:     General: Bowel sounds are normal. There is no distension.     Palpations: Abdomen is soft.     Tenderness: There is no abdominal tenderness.  Musculoskeletal:     Cervical back: Normal range of motion.     Comments: Mild bilateral pedal edema  Skin:    General: Skin is warm and dry.     Comments: Decreased skin turgor  Neurological:     General: No focal deficit present.     Mental Status: She is alert and oriented to person, place, and time.     Labs on Admission: I have personally reviewed following labs and imaging studies  CBC: Recent Labs  Lab 09/01/22 2037  WBC 7.5  NEUTROABS 5.1  HGB 12.6  HCT 38.4  MCV 96.2  PLT [id number]   Basic  Metabolic Panel: Recent Labs  Lab 09/01/22 2037  NA 127*  K 4.0  CL 92*  CO2 26  GLUCOSE 156*  BUN 27*  CREATININE 1.61*  CALCIUM 8.3*   GFR: Estimated Creatinine Clearance: 17.1 mL/min (A) (by C-G formula based on SCr of 1.61 mg/dL (H)). Liver Function Tests: Recent Labs  Lab 09/01/22 2037  AST 314*  ALT 285*  ALKPHOS 165*  BILITOT 0.8  PROT 6.1*  ALBUMIN 2.8*   No results for input(s): "LIPASE", "AMYLASE" in the last 168 hours. No results for input(s): "AMMONIA" in the last 168 hours. Coagulation Profile: No results for input(s): "INR", "PROTIME" in the last 168 hours. Cardiac Enzymes: No results for input(s): "CKTOTAL", "CKMB", "CKMBINDEX", "TROPONINI" in the last 168 hours. BNP (last 3 results) No results for input(s): "PROBNP" in the last 8760 hours. HbA1C: No results for input(s): "HGBA1C" in the last 72 hours. CBG: No results for input(s): "GLUCAP" in the last 168 hours. Lipid Profile: No results for input(s): "CHOL", "HDL", "LDLCALC", "TRIG", "CHOLHDL", "LDLDIRECT" in the last 72 hours. Thyroid Function Tests: No results for input(s): "TSH", "T4TOTAL", "FREET4", "T3FREE", "THYROIDAB" in the last 72 hours. Anemia Panel: No results for input(s): "VITAMINB12", "FOLATE", "FERRITIN", "TIBC", "IRON", "RETICCTPCT" in the last 72 hours. Urine analysis:    Component Value Date/Time   COLORURINE YELLOW 12/12/2018 2020   APPEARANCEUR CLEAR 12/12/2018 2020   LABSPEC 1.006 12/12/2018 2020   PHURINE 6.0 12/12/2018 2020   GLUCOSEU NEGATIVE 12/12/2018 2020   HGBUR NEGATIVE 12/12/2018 2020   BILIRUBINUR NEGATIVE 12/12/2018 2020   KETONESUR NEGATIVE 12/12/2018 2020   PROTEINUR NEGATIVE 12/12/2018 2020   UROBILINOGEN 0.2 04/24/2014 2257   NITRITE NEGATIVE 12/12/2018 2020   LEUKOCYTESUR NEGATIVE 12/12/2018 2020    Radiological Exams on Admission: DG Chest Port 1 View  Result Date: 09/01/2022 CLINICAL DATA:  Short of  breath, pneumonia EXAM: PORTABLE CHEST 1 VIEW  COMPARISON:  01/08/2019 FINDINGS: Single frontal view of the chest demonstrates a stable cardiac silhouette. There is diffuse interstitial prominence throughout the lungs, with slight progression since prior study. Increased density at the left lateral lung base with loss of the margins of the left hemidiaphragm could reflect left lower lobe pneumonia. No large effusion or pneumothorax. No acute bony abnormality. IMPRESSION: 1. Increased density left lateral lung base which could reflect left lower lobe pneumonia. 2. Progressive interstitial prominence throughout the lungs, which could reflect progressive scarring or superimposed reactive airway disease. Electronically Signed   By: Randa Ngo M.D.   On: 09/01/2022 20:30    Assessment and Plan  Left lower lobe pneumonia Failed outpatient antibiotic treatment.  Not hypoxic.  Lactic acid mildly elevated (2.1> 1.2) likely from dehydration.  No fever, leukocytosis, or signs of sepsis. COVID/influenza/RSV PCR negative.  Continue antibiotic coverage with ceftriaxone and azithromycin.  Mucinex, bronchodilator as needed, and pulmonary hygiene.  Sputum Gram stain and culture, strep pneumo/Legionella urinary antigens.  Blood cultures pending.  Supplemental oxygen as needed to keep oxygen saturation above 94%.  Hyponatremia Likely due to poor p.o. intake.  Sodium 127.  Continue gentle IV fluid hydration with normal saline and monitor BMP every 6 hours.  Check serum osmolarity.  Goal rate of correction 4 to 6 mEq in 24 hours.  Transaminitis No abdominal pain or tenderness.  No nausea or vomiting.  Acute hepatitis panel and right upper quadrant ultrasound ordered.  Continue to monitor LFTs.  Paroxysmal A-fib Stable, currently in sinus rhythm.  Not on anticoagulation due to history of GI bleed.  Continue metoprolol and amiodarone after pharmacy med rec is done.  Chronic HFpEF She has mild pedal edema but no evidence of pulmonary edema on imaging.  Check  BNP.  CKD stage IV Creatinine at baseline, continue to monitor.  Hypertension Stable.  Continue home meds after pharmacy med rec is done.  Hypothyroidism Continue Synthroid after pharmacy med rec is done.  Check TSH.  DVT prophylaxis: SQ Heparin Code Status: DNR/DNI (discussed with the patient) Family Communication: No family available at this time. Level of care: Med-Surg Admission status: It is my clinical opinion that referral for OBSERVATION is reasonable and necessary in this patient based on the above information provided. The aforementioned taken together are felt to place the patient at high risk for further clinical deterioration. However, it is anticipated that the patient may be medically stable for discharge from the hospital within 24 to 48 hours.   Shela Leff MD [facility]  If 7PM-7AM, please contact night-coverage [email or web address]  09/02/2022, 12:46 AM

## 2022-09-03 ENCOUNTER — Inpatient Hospital Stay (HOSPITAL_COMMUNITY): Payer: Medicare Other

## 2022-09-03 DIAGNOSIS — J189 Pneumonia, unspecified organism: Secondary | ICD-10-CM | POA: Diagnosis not present

## 2022-09-03 LAB — COMPREHENSIVE METABOLIC PANEL
ALT: 240 U/L — ABNORMAL HIGH (ref 0–44)
AST: 239 U/L — ABNORMAL HIGH (ref 15–41)
Albumin: 2.3 g/dL — ABNORMAL LOW (ref 3.5–5.0)
Alkaline Phosphatase: 151 U/L — ABNORMAL HIGH (ref 38–126)
Anion gap: 6 (ref 5–15)
BUN: 18 mg/dL (ref 8–23)
CO2: 23 mmol/L (ref 22–32)
Calcium: 8.3 mg/dL — ABNORMAL LOW (ref 8.9–10.3)
Chloride: 101 mmol/L (ref 98–111)
Creatinine, Ser: 1.31 mg/dL — ABNORMAL HIGH (ref 0.44–1.00)
GFR, Estimated: 37 mL/min — ABNORMAL LOW (ref >60)
Glucose, Bld: 89 mg/dL (ref 70–99)
Potassium: 3.8 mmol/L (ref 3.5–5.1)
Sodium: 130 mmol/L — ABNORMAL LOW (ref 135–145)
Total Bilirubin: 0.9 mg/dL (ref 0.3–1.2)
Total Protein: 5.5 g/dL — ABNORMAL LOW (ref 6.5–8.1)

## 2022-09-03 LAB — CBC WITH DIFFERENTIAL/PLATELET
Abs Immature Granulocytes: 0.07 10*3/uL (ref 0.00–0.07)
Basophils Absolute: 0 10*3/uL (ref 0.0–0.1)
Basophils Relative: 1 %
Eosinophils Absolute: 0.4 10*3/uL (ref 0.0–0.5)
Eosinophils Relative: 5 %
HCT: 35 % — ABNORMAL LOW (ref 36.0–46.0)
Hemoglobin: 11.8 g/dL — ABNORMAL LOW (ref 12.0–15.0)
Immature Granulocytes: 1 %
Lymphocytes Relative: 14 %
Lymphs Abs: 1.1 10*3/uL (ref 0.7–4.0)
MCH: 32.2 pg (ref 26.0–34.0)
MCHC: 33.7 g/dL (ref 30.0–36.0)
MCV: 95.6 fL (ref 80.0–100.0)
Monocytes Absolute: 1 10*3/uL (ref 0.1–1.0)
Monocytes Relative: 13 %
Neutro Abs: 5.2 10*3/uL (ref 1.7–7.7)
Neutrophils Relative %: 66 %
Platelets: UNDETERMINED 10*3/uL (ref 150–400)
RBC: 3.66 MIL/uL — ABNORMAL LOW (ref 3.87–5.11)
RDW: 14.3 % (ref 11.5–15.5)
WBC: 7.8 10*3/uL (ref 4.0–10.5)
nRBC: 0 % (ref 0.0–0.2)

## 2022-09-03 MED ORDER — SODIUM CHLORIDE 0.9 % IV SOLN: INTRAVENOUS | Status: DC

## 2022-09-03 MED ORDER — ENSURE ENLIVE PO LIQD
237.0000 mL | Freq: Two times a day (BID) | ORAL | Status: DC
  Administered 2022-09-04 – 2022-09-07 (×5): 237 mL via ORAL

## 2022-09-03 NOTE — Progress Notes (Addendum)
PROGRESS NOTE   Cassandra Ray  [id number] DOB: 04/13/1923 DOA: 09/01/2022 PCP: Lawerance Cruel, MD  Brief Narrative:   89 year old white female [facility]--semiindependent at baseline uses walker and wheelchair husband lives with her at age 57 and helps her mobilize Prior breast cancer, right-sided Permanent A-fib previously on Eliquis-stopped because of [name] lesions on EGD 01/09/2019 and dark stool Hemoccult positive-follows with Dr. [name] of cardiology Hypothyroidism on Synthroid CKD 3B HFpEF EF 60-65% 12/2018 Uncontrolled hypertension previously   Presented from [facility] with outpatient failure pneumonia and in setting of having been seen 3/17 at [facility] primary care and starting on Augmentin she appears to have finished her last dose of Augmentin on 3/29-day of arrival at the [facility] and had been placed on supplemental oxygen in the outpatient setting-   Seen by admitting physician started on antibiotics fluids  Hospital-Problem based course  Failed outpatient treatment of community-acquired pneumonia Risk of aspiration based on patient coughing while eating White count never elevated likely secondary to previous treatment in the outpatient Continuing azithromycin ceftriaxone at this time and de-escalate likely in 24 hours depending on speech input Obtain 2 view chest x-ray later today-patient may go off telemetry SLP consulted to see patient with regards to diet as she is actively coughing while eating at the bedside  Husband tells me had some "wind-pipe- stretching in the past and took some "Eating lessons"  Tea-toast potomania with hyponatremia recurring=--current serum Osmo = 270 indicative of this Restrict fluids to 1500 cc --a increase solute in diet May give Ensure  Mild AKI on admit superimposed on CKD 3B Metabolic acidosis and lactic acidosis from this and not infection Resolving-resume saline 75 cc/H  Permanent A-fib CHADVASC >4  has bled score >4 D/W [facility] cardiology Dr.Mallipeddi who recommends to completely discontinue amnio given age and risk factors Recommends to reconsult cardiology if further issues with rate control etc. At this time continuing Lopressor 12.5 twice daily  Transaminitis Etiology possibly secondary to amiodarone which was cut back from LFTs slowly trending down  Hyperthyroid state in the setting of prior hypothyroidism Would keep thyroxine current dose 50 given hypermetabolic state of infection Needs TSH in 2 to 3 weeks outpatient  Prior breast cancer right-sided Restricted right arm outpatient surveillance per PCP  DVT prophylaxis: Heparin Code Status: DNR confirmed at the bedside Family Communication: Discussed with the patient's husband Reesa Girdley on phone [phone] Disposition:  Status is: Inpatient Remains inpatient appropriate because:   Re-Quires input from speech and may require input from gastroenterology depending on MBS     Subjective: Coherent but quite hard of hearing She is coughing actively while eating oatmeal and feels nauseous and had to be given Zofran for this and we have kept her n.p.o.  Objective: Vitals:   09/02/22 1028 09/02/22 1406 09/02/22 1921 09/03/22 0543  BP: (!) 156/71 (!) 162/71 (!) 147/63 (!) 177/77  Pulse: 71 60 62 60  Resp: 18 18 20 20   Temp: 97.8 F (36.6 C) 98.2 F (36.8 C) 97.7 F (36.5 C) (!) 97.4 F (36.3 C)  TempSrc: Oral Oral Oral Oral  SpO2: 96% 95% 95% 94%  Weight:      Height:        Intake/Output Summary (Last 24 hours) at 09/03/2022 0925 Last data filed at 09/03/2022 [id number] Gross per 24 hour  Intake 765.92 ml  Output 3 ml  Net 762.92 ml   Filed Weights   09/01/22 1925  Weight: 64.4 kg  Examination:  Cachectic white female looking much younger than stated age Very hard of hearing A little confused and cannot tell me events of her medical history Wheezy throughout posterolaterally Abdomen soft No lower  extremity edema Neuro intact  Data Reviewed: personally reviewed   CBC    Component Value Date/Time   WBC 7.8 09/03/2022 0642   RBC 3.66 (L) 09/03/2022 0642   HGB 11.8 (L) 09/03/2022 0642   HGB 13.2 09/21/2020 1358   HCT 35.0 (L) 09/03/2022 0642   HCT 39.6 09/21/2020 1358   PLT PLATELET CLUMPS NOTED ON SMEAR, UNABLE TO ESTIMATE 09/03/2022 0642   PLT 172 09/21/2020 1358   MCV 95.6 09/03/2022 0642   MCV 98 (H) 09/21/2020 1358   MCH 32.2 09/03/2022 0642   MCHC 33.7 09/03/2022 0642   RDW 14.3 09/03/2022 0642   RDW 12.2 09/21/2020 1358   LYMPHSABS 1.1 09/03/2022 0642   MONOABS 1.0 09/03/2022 0642   EOSABS 0.4 09/03/2022 0642   BASOSABS 0.0 09/03/2022 0642      Latest Ref Rng & Units 09/03/2022    6:42 AM 09/02/2022   11:37 AM 09/02/2022    6:08 AM  CMP  Glucose 70 - 99 mg/dL 89   85   BUN 8 - 23 mg/dL 18   25   Creatinine 0.44 - 1.00 mg/dL 1.31   1.40   Sodium 135 - 145 mmol/L 130   133   Potassium 3.5 - 5.1 mmol/L 3.8   4.0   Chloride 98 - 111 mmol/L 101   101   CO2 22 - 32 mmol/L 23   24   Calcium 8.9 - 10.3 mg/dL 8.3   8.1   Total Protein 6.5 - 8.1 g/dL 5.5  6.4    Total Bilirubin 0.3 - 1.2 mg/dL 0.9  0.8    Alkaline Phos 38 - 126 U/L 151  166    AST 15 - 41 U/L 239  329    ALT 0 - 44 U/L 240  286       Radiology Studies: US Abdomen Limited RUQ (LIVER/GB)  Result Date: 09/02/2022 CLINICAL DATA:  Elevated LFTs EXAM: ULTRASOUND ABDOMEN LIMITED RIGHT UPPER QUADRANT COMPARISON:  12/11/2014 FINDINGS: Gallbladder: No gallstones or wall thickening visualized. No sonographic Murphy sign noted by sonographer. Common bile duct: Diameter: 2.9 mm Liver: Mildly increased in echogenicity without evidence of focal mass. Portal vein is patent on color Doppler imaging with normal direction of blood flow towards the liver. Other: None. IMPRESSION: Fatty infiltration of the liver. No other focal abnormality is noted. Electronically Signed   By: Inez Catalina M.D.   On: 09/02/2022 02:53    DG Chest Port 1 View  Result Date: 09/01/2022 CLINICAL DATA:  Short of breath, pneumonia EXAM: PORTABLE CHEST 1 VIEW COMPARISON:  01/08/2019 FINDINGS: Single frontal view of the chest demonstrates a stable cardiac silhouette. There is diffuse interstitial prominence throughout the lungs, with slight progression since prior study. Increased density at the left lateral lung base with loss of the margins of the left hemidiaphragm could reflect left lower lobe pneumonia. No large effusion or pneumothorax. No acute bony abnormality. IMPRESSION: 1. Increased density left lateral lung base which could reflect left lower lobe pneumonia. 2. Progressive interstitial prominence throughout the lungs, which could reflect progressive scarring or superimposed reactive airway disease. Electronically Signed   By: Randa Ngo M.D.   On: 09/01/2022 20:30     Scheduled Meds:  acetaminophen  1,000 mg Oral Q8H   feeding  supplement  237 mL Oral BID BM   guaiFENesin  600 mg Oral BID   heparin  5,000 Units Subcutaneous Q8H   levothyroxine  50 mcg Oral QAC breakfast   metoprolol tartrate  12.5 mg Oral BID   pantoprazole  40 mg Oral Daily   polyethylene glycol  17 g Oral Daily   Continuous Infusions:  sodium chloride     azithromycin 500 mg (09/02/22 2338)   cefTRIAXone (ROCEPHIN)  IV 1 g (09/02/22 2110)     LOS: 1 day   Time spent: Bevier, MD [facility] To contact the attending provider between 7A-7P or the covering provider during after hours 7P-7A, please log into the web site [email or web address] and access using universal [place] password for that web site. If you do not have the password, please call the hospital operator.  09/03/2022, 9:25 AM

## 2022-09-03 NOTE — Hospital Course (Signed)
known large hiatal hernia as well as a small traction diverticulum below the carina, flash penetration of thick liquids and normal motility per prior esophagram in 2016- GI MD advised against EGD at that time.

## 2022-09-03 NOTE — Evaluation (Signed)
Clinical/Bedside Swallow Evaluation Patient Details  Name: Cassandra Ray MRN: [id number] Date of Birth: 16-Jan-1923  Today's Date: 09/03/2022 Time: SLP Start Time (ACUTE ONLY): 57 SLP Stop Time (ACUTE ONLY): 1710 SLP Time Calculation (min) (ACUTE ONLY): 35 min  Past Medical History:  Past Medical History:  Diagnosis Date   Actinic keratoses    Arthritis    back    Breast cancer ([place])    Breast cancer (right)   Chronic diastolic CHF (congestive heart failure) (HCC)    CKD (chronic kidney disease), stage III (HCC)    Compression fracture of L3 lumbar vertebra    Dysphagia    GERD (gastroesophageal reflux disease)    takes otc Pepcid as needed   Hiatal hernia 11/27/2014   HTN (hypertension)    Hypothyroidism    Insomnia    Osteopenia    PAF (paroxysmal atrial fibrillation) ([place])    a. questionable episode in 2015 in [place]. b. event monitor through December/January 2014/2015 which showed no evidence of atrial fibrillation. c. Dx 12/2018.   PAT (paroxysmal atrial tachycardia)    Pleural effusion    Past Surgical History:  Past Surgical History:  Procedure Laterality Date   BREAST LUMPECTOMY Right 1995   lumpectomy   COLONOSCOPY     ESOPHAGOGASTRODUODENOSCOPY (EGD) WITH PROPOFOL Left 01/09/2019   Procedure: ESOPHAGOGASTRODUODENOSCOPY (EGD) WITH PROPOFOL;  Surgeon: Carol Ada, MD;  Location: [facility];  Service: Endoscopy;  Laterality: Left;   EYE SURGERY Bilateral    cataract w/ lens implant   KYPHOPLASTY N/A 09/18/2013   Procedure: KYPHOPLASTY;  Surgeon: Sinclair Ship, MD;  Location: [facility];  Service: Orthopedics;  Laterality: N/A;  Lumbar 3 kyphoplasty   TONSILLECTOMY     HPI:  Patient is a 89 year old female admitted to [facility] long hospital with left lower lobe pneumonia, hyponatremia.  Patient has history of GERD, breast cancer, dysphagia, hiatal hernia.  She was treated for pneumonia several days prior to admission but did not improve.  Prior esophagram in 2016  showed hiatal hernia as well as traction diverticulum below the carina, normal motility, flash penetration of 50 liquid.  Her GI doctor at that time did not advise endoscopy.  Swallow evaluation ordered by Dr. Verlon Au.  CXR 09/03/2022 shows  Small left pleural effusion and mild interstitial edema.  Correlate for any signs/symptoms of CHF.  2. Decreased aeration to the left base may reflect atelectasis or  airspace disease.    Assessment / Plan / Recommendation  Clinical Impression  Patient present with clinical indications concerning for multifactorial dysphagia including oropharyngeal dysphagia as well as known esophageal deficits.  Currently she is grossly weak and demonstrates wet, weak phonation.  Decreased right labial seal allowed minimal loss of liquids as well as trace loss of Jell-O without awareness.  Patient noted to tilt her head to and fro presumed to transit bolus into pharynx.  Swallowing appears effortful for this patient at this time and she advises that it is not enjoyable for her to eat and drink.  Patient only seen with liquid intake including lemon lime soda, water, and a few boluses of Jell-O.  Multiple swallows noted concerning for pharyngeal retention as noted on prior modified barium swallow in 2019.  Delayed cough observed with patient expectorating orange-tinged secretions, concerning for potential aspiration of secretions as well as intake.  SLP inquired to patient if she senses retention and her parents lower her esophagus, and if she coughs with intake liquids and solids, to which patient stated I do  not know several times.  She appeared annoyed by this SLP asking her questions.  At this time recommend patient continue a liquid diet with strict aspiration precautions and proceed with MBS and esophagram on 4/1 for instrumental evaluation to determine if strategies, diet modifications may help to mitigate dysphagia, aspiration and maximize nutrition.   Per GI note from 2016, they have  not advised endoscopy at that time. SLP Visit Diagnosis: Dysphagia, oropharyngeal phase (R13.12)    Aspiration Risk  Risk for inadequate nutrition/hydration;Moderate aspiration risk    Diet Recommendation Thin liquid (full liquids)   Liquid Administration via: Cup;Straw Medication Administration: Whole meds with puree Supervision: Patient able to self feed Compensations: Slow rate;Small sips/bites Postural Changes: Seated upright at 90 degrees;Remain upright for at least 30 minutes after po intake    Other  Recommendations Oral Care Recommendations: Oral care BID    Recommendations for follow up therapy are one component of a multi-disciplinary discharge planning process, led by the attending physician.  Recommendations may be updated based on patient status, additional functional criteria and insurance authorization.  Follow up Recommendations   N/a     Assistance Recommended at Discharge  N/a  Functional Status Assessment Patient has had a recent decline in their functional status and/or demonstrates limited ability to make significant improvements in function in a reasonable and predictable amount of time  Frequency and Duration min 1 x/week  1 week       Prognosis Prognosis for improved oropharyngeal function: Fair Barriers to Reach Goals: Cognitive deficits      Swallow Study   General Date of Onset: 09/03/22 HPI: Patient is a 89 year old female admitted to [facility] long hospital with left lower lobe pneumonia, hyponatremia.  Patient has history of GERD, breast cancer, dysphagia, hiatal hernia.  She was treated for pneumonia several days prior to admission but did not improve.  Prior esophagram in 2016 showed hiatal hernia as well as traction diverticulum below the carina, normal motility, flash penetration of 50 liquid.  Her GI doctor at that time did not advise endoscopy.  Swallow evaluation ordered by Dr. Verlon Au.  CXR 09/03/2022 shows  Small left pleural effusion and mild  interstitial edema.  Correlate for any signs/symptoms of CHF.  2. Decreased aeration to the left base may reflect atelectasis or  airspace disease. Type of Study: Bedside Swallow Evaluation Diet Prior to this Study: Full liquid diet Temperature Spikes Noted: Yes Respiratory Status: Room air History of Recent Intubation: No Behavior/Cognition: Alert;Cooperative;Other (Comment);Confused (Patient is nebulous regarding her dysphagia symptoms) Oral Cavity Assessment: Dry Oral Cavity - Dentition: Adequate natural dentition Vision: Impaired for self-feeding (pt grossly weak) Self-Feeding Abilities: Total assist Patient Positioning: Upright in bed Baseline Vocal Quality: Other (comment);Wet;Low vocal intensity Volitional Cough: Congested;Other (Comment) (Strong when patient makes effort) Volitional Swallow: Able to elicit (Swallows appear effortful for patient)    Oral/Motor/Sensory Function Overall Oral Motor/Sensory Function: Generalized oral weakness (Patient with right labial loss of liquid and jello) Facial ROM: Reduced right Lingual ROM:  (generalized weakness) Lingual Symmetry: Within Functional Limits Lingual Strength: Within Functional Limits Velum: Within Functional Limits Mandible: Within Functional Limits   Ice Chips Ice chips: Within functional limits Presentation: Spoon   Thin Liquid Thin Liquid: Impaired Presentation: Cup;Spoon Oral Phase Impairments: Reduced lingual movement/coordination Oral Phase Functional Implications: Right anterior spillage Pharyngeal  Phase Impairments: Suspected delayed Swallow    Nectar Thick Nectar Thick Liquid: Not tested   Honey Thick Honey Thick Liquid: Not tested   Puree Puree: Impaired Presentation:  Self Fed;Spoon Oral Phase Impairments: Reduced labial seal Oral Phase Functional Implications: Right anterior spillage Pharyngeal Phase Impairments: Suspected delayed Swallow;Cough - Delayed Other Comments: Cough and expectoration of secretions  coated with orange concerning for retention of Jell-O.   Solid     Solid: Not tested      Macario Golds 09/03/2022,5:26 PM  [name], MS [place] Office [phone]

## 2022-09-04 ENCOUNTER — Inpatient Hospital Stay (HOSPITAL_COMMUNITY): Payer: Medicare Other

## 2022-09-04 DIAGNOSIS — J189 Pneumonia, unspecified organism: Secondary | ICD-10-CM | POA: Diagnosis not present

## 2022-09-04 LAB — RENAL FUNCTION PANEL
Albumin: 2.5 g/dL — ABNORMAL LOW (ref 3.5–5.0)
Anion gap: 6 (ref 5–15)
BUN: 14 mg/dL (ref 8–23)
CO2: 22 mmol/L (ref 22–32)
Calcium: 8.4 mg/dL — ABNORMAL LOW (ref 8.9–10.3)
Chloride: 106 mmol/L (ref 98–111)
Creatinine, Ser: 1.15 mg/dL — ABNORMAL HIGH (ref 0.44–1.00)
GFR, Estimated: 43 mL/min — ABNORMAL LOW (ref >60)
Glucose, Bld: 75 mg/dL (ref 70–99)
Phosphorus: 2.5 mg/dL (ref 2.5–4.6)
Potassium: 3.8 mmol/L (ref 3.5–5.1)
Sodium: 134 mmol/L — ABNORMAL LOW (ref 135–145)

## 2022-09-04 LAB — HEPATIC FUNCTION PANEL
ALT: 220 U/L — ABNORMAL HIGH (ref 0–44)
AST: 208 U/L — ABNORMAL HIGH (ref 15–41)
Albumin: 2.5 g/dL — ABNORMAL LOW (ref 3.5–5.0)
Alkaline Phosphatase: 161 U/L — ABNORMAL HIGH (ref 38–126)
Bilirubin, Direct: 0.4 mg/dL — ABNORMAL HIGH (ref 0.0–0.2)
Indirect Bilirubin: 0.6 mg/dL (ref 0.3–0.9)
Total Bilirubin: 1 mg/dL (ref 0.3–1.2)
Total Protein: 6.1 g/dL — ABNORMAL LOW (ref 6.5–8.1)

## 2022-09-04 MED ORDER — LIP MEDEX EX OINT
TOPICAL_OINTMENT | CUTANEOUS | Status: DC | PRN
  Filled 2022-09-04: qty 7

## 2022-09-04 NOTE — Consult Note (Signed)
Referring Provider: Dr. Verlon Au Primary Care Physician:  Lawerance Cruel, MD Primary Gastroenterologist:  UNASSIGNED  Reason for Consultation:  Dysphagia  HPI: Cassandra Ray is a 89 y.o. female with long history of dysphagia with concerns of aspiration pneumonia on IV Abx. Full liquid diet recommended by speech path. Patient unable to give me any history and no family in room at this time. Barium swallow limited study and question of distal esophageal stricture. Poor esophageal motility with tertiary contractions. Moderate to large hiatal hernia. No aspiration or penetration during modified barium swallow.  Past Medical History:  Diagnosis Date   Actinic keratoses    Arthritis    back    Breast cancer ([place])    Breast cancer (right)   Chronic diastolic CHF (congestive heart failure) (HCC)    CKD (chronic kidney disease), stage III (HCC)    Compression fracture of L3 lumbar vertebra    Dysphagia    GERD (gastroesophageal reflux disease)    takes otc Pepcid as needed   Hiatal hernia 11/27/2014   HTN (hypertension)    Hypothyroidism    Insomnia    Osteopenia    PAF (paroxysmal atrial fibrillation) ([place])    a. questionable episode in 2015 in [place]. b. event monitor through December/January 2014/2015 which showed no evidence of atrial fibrillation. c. Dx 12/2018.   PAT (paroxysmal atrial tachycardia)    Pleural effusion     Past Surgical History:  Procedure Laterality Date   BREAST LUMPECTOMY Right 1995   lumpectomy   COLONOSCOPY     ESOPHAGOGASTRODUODENOSCOPY (EGD) WITH PROPOFOL Left 01/09/2019   Procedure: ESOPHAGOGASTRODUODENOSCOPY (EGD) WITH PROPOFOL;  Surgeon: Carol Ada, MD;  Location: [facility];  Service: Endoscopy;  Laterality: Left;   EYE SURGERY Bilateral    cataract w/ lens implant   KYPHOPLASTY N/A 09/18/2013   Procedure: KYPHOPLASTY;  Surgeon: Sinclair Ship, MD;  Location: [facility];  Service: Orthopedics;  Laterality: N/A;  Lumbar 3 kyphoplasty    TONSILLECTOMY      Prior to Admission medications   Medication Sig Start Date End Date Taking? Authorizing Provider  acetaminophen (TYLENOL) 500 MG tablet Take 2 tablets (1,000 mg total) by mouth every 8 (eight) hours. Patient taking differently: Take 650 mg by mouth every 8 (eight) hours. 01/21/21  Yes Royal Hawthorn, NP  amiodarone (PACERONE) 200 MG tablet Take 1 tablet (200 mg total) by mouth daily. Please keep upcoming appt for future refills Patient taking differently: Take 200 mg by mouth daily. 07/29/21  Yes Jerline Pain, MD  amoxicillin-clavulanate (AUGMENTIN) 875-125 MG tablet Take 1 tablet by mouth 2 (two) times daily. Patient not taking: Reported on 09/04/2022 08/26/22  Yes [provider]  B Complex-C (B-COMPLEX WITH VITAMIN C) tablet Take 1 tablet by mouth daily.   Yes [provider]  benzonatate (TESSALON) 100 MG capsule Take 100 mg by mouth 3 (three) times daily as needed for cough.   Yes [provider]  Cholecalciferol (VITAMIN D3) 2000 units TABS Take 2,000 Units by mouth daily.    Yes [provider]  Dextromethorphan-guaiFENesin (ROBAFEN DM) 10-100 MG/5ML liquid Take 10 mLs by mouth every 6 (six) hours as needed (Cough). Patient not taking: Reported on 09/04/2022   Yes [provider]  diphenhydramine-acetaminophen (TYLENOL PM) 25-500 MG TABS tablet Take 1 tablet by mouth at bedtime as needed ([place]). Patient not taking: Reported on 09/04/2022   Yes [provider]  furosemide (LASIX) 20 MG tablet Take 1 tablet (20 mg total) by  mouth daily. 08/09/21  Yes Jerline Pain, MD  guaiFENesin (MUCINEX) 600 MG 12 hr tablet Take 600 mg by mouth in the morning.   Yes [provider]  guaiFENesin (MUCINEX) 600 MG 12 hr tablet Take 600 mg by mouth daily as needed for cough.   Yes [provider]  ipratropium-albuterol (DUONEB) 0.5-2.5 (3) MG/3ML SOLN Take 3 mLs by nebulization 2 (two) times daily as needed  (Wheezing/SOB). 08/23/22  Yes [provider]  ipratropium-albuterol (DUONEB) 0.5-2.5 (3) MG/3ML SOLN Take 3 mLs by nebulization 4 (four) times daily.   Yes [provider]  levothyroxine (SYNTHROID) 112 MCG tablet Take 112 mcg by mouth every morning. 07/01/20  Yes [provider]  melatonin 5 MG TABS Take 5 mg by mouth at bedtime as needed (Sleep).   Yes [provider]  metoprolol tartrate (LOPRESSOR) 25 MG tablet Take 25 mg by mouth in the morning.   Yes [provider]  nitroGLYCERIN (NITROSTAT) 0.4 MG SL tablet Place 1 tablet (0.4 mg total) under the tongue every 5 (five) minutes as needed for chest pain. 01/05/20  Yes Jerline Pain, MD  ondansetron (ZOFRAN) 4 MG tablet Take 4 mg by mouth every 6 (six) hours as needed for nausea or vomiting.   Yes [provider]  pantoprazole (PROTONIX) 40 MG tablet Take 1 tablet (40 mg total) by mouth daily. 01/11/19  Yes Hall, Archie Patten N, DO  polyethylene glycol powder (GLYCOLAX/MIRALAX) 17 GM/SCOOP powder Take 17 g by mouth daily. Patient taking differently: Take 17 g by mouth daily as needed for moderate constipation. 01/21/21  Yes Wert, Margreta Journey, NP  Probiotic Product (ALIGN) 4 MG CAPS Take 4 mg by mouth daily.   Yes [provider]  sennosides-docusate sodium (SENOKOT-S) 8.6-50 MG tablet Take 1 tablet by mouth at bedtime as needed for constipation. Patient taking differently: Take 2 tablets by mouth at bedtime as needed for constipation. 01/21/21  Yes Royal Hawthorn, NP    Scheduled Meds:  acetaminophen  1,000 mg Oral Q8H   feeding supplement  237 mL Oral BID BM   guaiFENesin  600 mg Oral BID   heparin  5,000 Units Subcutaneous Q8H   levothyroxine  50 mcg Oral QAC breakfast   metoprolol tartrate  12.5 mg Oral BID   pantoprazole  40 mg Oral Daily   polyethylene glycol  17 g Oral Daily   Continuous Infusions:  sodium chloride 75 mL/hr at 09/03/22 1051   azithromycin 500 mg (09/03/22 2332)    cefTRIAXone (ROCEPHIN)  IV 1 g (09/03/22 2027)   PRN Meds:.albuterol, lip balm, melatonin, methocarbamol, nitroGLYCERIN, ondansetron  Allergies as of 09/01/2022 - Review Complete 09/01/2022  Allergen Reaction Noted   Cardizem [diltiazem]  09/01/2022   Eliquis [apixaban]  09/01/2022   Tape Other (See Comments) 05/23/2018    Family History  Problem Relation Age of Onset   CAD Mother    Diabetes Mellitus II Mother    Heart failure Mother    Heart failure Father    Prostate cancer Brother    Kidney cancer Brother    Heart disease Sister    Esophageal cancer Neg Hx    Colon cancer Neg Hx     Social History   Socioeconomic History   Marital status: Married    Spouse name: Not on file   Number of children: Not on file   Years of education: Not on file   Highest education level: Not on file  Occupational History   Not  on file  Tobacco Use   Smoking status: Former   Smokeless tobacco: Never   Tobacco comments:    only in college  Vaping Use   Vaping Use: Never used  Substance and Sexual Activity   Alcohol use: Yes    Alcohol/week: 7.0 standard drinks of alcohol    Types: 7 Glasses of wine per week    Comment: 1 glass of wine a day   Drug use: No   Sexual activity: Not on file  Other Topics Concern   Not on file  Social History Narrative   Not on file   Social Determinants of Health   Financial Resource Strain: Not on file  Food Insecurity: No Food Insecurity (09/02/2022)   Hunger Vital Sign    Worried About Running Out of Food in the Last Year: Never true    Ran Out of Food in the Last Year: Never true  Transportation Needs: No Transportation Needs (09/02/2022)   PRAPARE - Hydrologist (Medical): No    Lack of Transportation (Non-Medical): No  Physical Activity: Not on file  Stress: Not on file  Social Connections: Not on file  Intimate Partner Violence: Not At Risk (09/02/2022)   Humiliation, Afraid, Rape, and Kick questionnaire     Fear of Current or Ex-Partner: No    Emotionally Abused: No    Physically Abused: No    Sexually Abused: No    Review of Systems: All negative except as stated above in HPI.  Physical Exam: Vital signs: Vitals:   09/04/22 0451 09/04/22 1404  BP: (!) 188/91 (!) 157/66  Pulse: 68 65  Resp: 18 (!) 21  Temp: (!) 97.3 F (36.3 C) 97.9 F (36.6 C)  SpO2: 93% 95%   Last BM Date : 09/03/22 General: Lethargic, elderly, thin, hard of hearing, no acute distress    Head: normocephalic, atraumatic Eyes: anicteric sclera ENT: oropharynx clear Neck: supple, nontender Lungs:  Clear throughout to auscultation.   No wheezes, crackles, or rhonchi. No acute distress. Heart:  Regular rate and rhythm; no murmurs, clicks, rubs,  or gallops. Abdomen: soft, nontender, nondistended, +BS  Rectal:  Deferred Ext: no edema  GI:  Lab Results: Recent Labs    09/01/22 2037 09/02/22 0713 09/03/22 0642  WBC 7.5 8.7 7.8  HGB 12.6 12.1 11.8*  HCT 38.4 36.1 35.0*  PLT 173 194 PLATELET CLUMPS NOTED ON SMEAR, UNABLE TO ESTIMATE   BMET Recent Labs    09/02/22 0608 09/03/22 0642 09/04/22 0750  NA 133* 130* 134*  K 4.0 3.8 3.8  CL 101 101 106  CO2 24 23 22   GLUCOSE 85 89 75  BUN 25* 18 14  CREATININE 1.40* 1.31* 1.15*  CALCIUM 8.1* 8.3* 8.4*   LFT Recent Labs    09/04/22 0750  PROT 6.1*  ALBUMIN 2.5*  2.5*  AST 208*  ALT 220*  ALKPHOS 161*  BILITOT 1.0  BILIDIR 0.4*  IBILI 0.6   PT/INR Recent Labs    09/02/22 0939  LABPROT 15.1  INR 1.2     Studies/Results: DG ESOPHAGUS W SINGLE CM (SOL OR THIN BA)  Result Date: 09/04/2022 CLINICAL DATA:  Coughing after eating and drinking. Evaluate for an esophageal stricture. EXAM: ESOPHOGRAM/BARIUM SWALLOW TECHNIQUE: Single contrast examination was performed using  thin barium. FLUOROSCOPY: Radiation Exposure Index (as provided by the fluoroscopic device): 4.6 mGy Kerma COMPARISON:  None. FINDINGS: A very limited examination was performed  in the recumbent LPO position, due to patient  condition. Patient was only able to drink very minimal contrast with coughing after each swallow. Poor esophageal motility with tertiary contractions. Suspect a distal esophageal stricture. Study was terminated due to excessive coughing and patient condition. Moderate to large hiatal hernia. IMPRESSION: 1. Very limited examination was performed due to patient condition, as detailed above. 2. Suspect a distal esophageal stricture. Tablet was not administered due to patient condition. 3. Esophageal dysmotility. 4. Moderate to large hiatal hernia. Electronically Signed   By: Lorin Picket M.D.   On: 09/04/2022 12:01    Impression/Plan: Dysphagia likely due to age-related dysmotility and would manage conservatively and not pursue an EGD. Follow speech path recs of full liquid diet for now and decision on advancing defer to speech path. Discussed with husband by phone. Dr. Verlon Au aware. Agree with plan for palliative evaluation. Will sign off. Call if questions.    LOS: 2 days   Lear Ng  09/04/2022, 5:45 PM  Questions please call [phone]

## 2022-09-04 NOTE — Progress Notes (Signed)
Modified Barium Swallow Study  Patient Details  Name: Cassandra Ray MRN: [id number] Date of Birth: July 08, 1922  Today's Date: 09/04/2022  Modified Barium Swallow completed.  Full report located under Chart Review in the Imaging Section.  History of Present Illness Patient is a 89 year old female admitted to [facility] long hospital with left lower lobe pneumonia, hyponatremia.  Patient has history of GERD, breast cancer, dysphagia, hiatal hernia.  She was treated for pneumonia several days prior to admission but did not improve.  Prior esophagram in 2016 showed hiatal hernia as well as traction diverticulum below the carina, normal motility, flash penetration of 50 liquid.  Her GI doctor at that time did not advise endoscopy.  Swallow evaluation ordered by Dr. Verlon Au.  CXR 09/03/2022 shows  Small left pleural effusion and mild interstitial edema.  Correlate for any signs/symptoms of CHF.  2. Decreased aeration to the left base may reflect atelectasis or  airspace disease.   Clinical Impression Patient was initially reluctant but particiapted fully in this MBS. SLP assessed her swallow with the following consistencies of barium: thin, nectar thick, puree. Full MBSimp protocol not followed secondary to patient's willingness and ability to participate. She exhibits a moderately impaired oral and mild-moderately impaired pharyngeal phase of swallow characterized by incomplete epiglottic inversion, weak tongue base retraction and reduced PES opening. i n addition, she appeared with xerostomia of oral cavity. These impairments led to prolonged anterior to posterior transit of bolus in oral cavity, mild and at times mild-moderate amount of barium residuals at base of tongue and vallecular sinus. PES opening appeared improved with larger bolus size (ie cup or straw sip as opposed to spoon). Although only partial epiglottic inversion was observed with initial spoon sip of thin liquids, full inversion was observed  with all subsequent swallows.  No penetration or aspiration observed with any of the tested consistencies. Patient did have incidents of wet sounding cough, but this was in the absence of any barium in laryngeal vestibule and was likely due to secretions. SLP recommending continue with full liquids, however plan to assess her toleration of advanced solids at bedside. Factors that may increase risk of adverse event in presence of aspiration ([place] 2021): Poor general health and/or compromised immunity;Frail or deconditioned;Weak cough;Reduced saliva  Swallow Evaluation Recommendations Recommendations: PO diet PO Diet Recommendation: Full liquid diet;Thin liquids (Level 0) Liquid Administration via: Cup;Straw Medication Administration: Whole meds with puree Supervision: Full supervision/cueing for swallowing strategies;Full assist for feeding Swallowing strategies  : Slow rate;Small bites/sips;Check for pocketing or oral holding Postural changes: Position pt fully upright for meals;Stay upright 30-60 min after meals Oral care recommendations: Oral care BID (2x/day);Staff/trained caregiver to provide oral care     Sonia Baller, MA, CCC-SLP Speech Therapy

## 2022-09-04 NOTE — Progress Notes (Signed)
PROGRESS NOTE   Cassandra Ray  [id number] DOB: 05/18/1923 DOA: 09/01/2022 PCP: Lawerance Cruel, MD  Brief Narrative:   89 year old white female [facility]--semiindependent at baseline uses walker and wheelchair husband lives with her at age 59 and helps her mobilize Prior breast cancer, right-sided Permanent A-fib previously on Eliquis-stopped because of Cassandra Ray lesions on EGD 01/09/2019 and dark stool Hemoccult positive-follows with Dr. [name] of cardiology Hypothyroidism on Synthroid CKD 3B HFpEF EF 60-65% 12/2018 Uncontrolled hypertension previously   Presented from [facility] with outpatient failure pneumonia and in setting of having been seen 3/17 at [facility] primary care and starting on Augmentin she appears to have finished her last dose of Augmentin on 3/29-day of arrival at the [facility] and had been placed on supplemental oxygen in the outpatient setting-   Seen by admitting physician started on antibiotics fluids 4/1 esophagram very limited examination due to patient condition distal esophageal stricture?-Was coughing throughout exam so tablet was not administered tertiary contractions of esophagus noted moderate to large hiatal hernia noted   Hospital-Problem based course  Aspiration pneumonia on admission-risk factors hiatal hernia oropharyngeal dysphagia and possible esophageal stricture Two-view x-ray personally overviewed from 3/31 shows bilateral aspiration SLP c at this time recommends thin full liquid diet, strict aspiration precautions MBS and esophagram  Esophagram could not be completed because of patient coughing during procedure and tablet was not given GI formally consulted 4/1 for input  Tea-toast potomania with hyponatremia recurring=--current serum Osmo = 270 indicative of this Restrict fluids to 1500 cc - Resumed saline to correct this and it is resolving  Mild AKI on admit superimposed on CKD 3B Metabolic acidosis and lactic  acidosis from this and not infection Resolving-resume saline 75 cc/H  Permanent A-fib CHADVASC >4 has bled score >4 D/W [facility] cardiology Dr.Mallipeddi who recommends to completely discontinue amnio given age and risk factors Recommends to reconsult cardiology if further issues with rate control etc. At this time continuing Lopressor 12.5 twice daily only  Transaminitis Etiology possibly secondary to amiodarone which was cut back from LFTs slowly trending down Periodic rechecks of LFTs, bili is not elevated Check a.m. INR  Hyperthyroid state in the setting of prior hypothyroidism Would keep thyroxine current dose 50 given hypermetabolic state of infection Needs TSH in 2 to 3 weeks outpatient  Prior breast cancer right-sided Restricted right arm outpatient surveillance per PCP  DVT prophylaxis: Heparin Code Status: DNR confirmed at the bedside Family Communication: Discussed with the patient's husband Cassandra Ray at the bedside 8 on 4/1-his phone number is [phone] Disposition:  Status is: Inpatient Remains inpatient appropriate because:   Re-Quires input from speech and may require input from gastroenterology depending on MBS     Subjective: \ Coherent but does not seem to understand the medical terminology Husband at the bedside and I had a good discussion with him about all of these concerns-he is aware she is at high risk for any instrumented procedure and is not clear if this is something she would want She does not have any chest pain I can hear audible wheezes from a few feet away of her She seems about the same as yesterday that is chronically ill appearing.  Objective: Vitals:   09/03/22 1309 09/03/22 1937 09/04/22 0451 09/04/22 0451  BP: (!) 185/76 (!) 186/86  (!) 188/91  Pulse: 61 66  68  Resp: 16 18  18   Temp: 97.6 F (36.4 C) (!) 97.5 F (36.4 C)  (!) 97.3 F (36.3  C)  TempSrc: Oral Oral  Oral  SpO2: 95% 95% 93% 93%  Weight:      Height:         Intake/Output Summary (Last 24 hours) at 09/04/2022 1334 Last data filed at 09/04/2022 0500 Gross per 24 hour  Intake 240 ml  Output 1600 ml  Net -1360 ml    Filed Weights   09/01/22 1925  Weight: 64.4 kg    Examination:  Cachectic and somewhat ill-appearing white female looking about stated age Very hard of hearing Wheezy throughout posterolaterally Abdomen soft no rebound no guarding cannot appreciate organomegaly No lower extremity edema although some excoriations to anterior shins Neuro intact moving 4 limbs equally  Data Reviewed: personally reviewed   CBC    Component Value Date/Time   WBC 7.8 09/03/2022 0642   RBC 3.66 (L) 09/03/2022 0642   HGB 11.8 (L) 09/03/2022 0642   HGB 13.2 09/21/2020 1358   HCT 35.0 (L) 09/03/2022 0642   HCT 39.6 09/21/2020 1358   PLT PLATELET CLUMPS NOTED ON SMEAR, UNABLE TO ESTIMATE 09/03/2022 0642   PLT 172 09/21/2020 1358   MCV 95.6 09/03/2022 0642   MCV 98 (H) 09/21/2020 1358   MCH 32.2 09/03/2022 0642   MCHC 33.7 09/03/2022 0642   RDW 14.3 09/03/2022 0642   RDW 12.2 09/21/2020 1358   LYMPHSABS 1.1 09/03/2022 0642   MONOABS 1.0 09/03/2022 0642   EOSABS 0.4 09/03/2022 0642   BASOSABS 0.0 09/03/2022 0642      Latest Ref Rng & Units 09/04/2022    7:50 AM 09/03/2022    6:42 AM 09/02/2022   11:37 AM  CMP  Glucose 70 - 99 mg/dL 75  89    BUN 8 - 23 mg/dL 14  18    Creatinine 0.44 - 1.00 mg/dL 1.15  1.31    Sodium 135 - 145 mmol/L 134  130    Potassium 3.5 - 5.1 mmol/L 3.8  3.8    Chloride 98 - 111 mmol/L 106  101    CO2 22 - 32 mmol/L 22  23    Calcium 8.9 - 10.3 mg/dL 8.4  8.3    Total Protein 6.5 - 8.1 g/dL 6.1  5.5  6.4   Total Bilirubin 0.3 - 1.2 mg/dL 1.0  0.9  0.8   Alkaline Phos 38 - 126 U/L 161  151  166   AST 15 - 41 U/L 208  239  329   ALT 0 - 44 U/L 220  240  286      Radiology Studies: DG Swallowing Func-Speech Pathology  Result Date: 09/04/2022 Table formatting from the original result was not included.  Modified Barium Swallow Study Patient Details Name: Cassandra Ray MRN: [id number] Date of Birth: 1923-04-04 Today's Date: 09/04/2022 HPI/PMH: HPI: Patient is a 89 year old female admitted to [facility] long hospital with left lower lobe pneumonia, hyponatremia.  Patient has history of GERD, breast cancer, dysphagia, hiatal hernia.  She was treated for pneumonia several days prior to admission but did not improve.  Prior esophagram in 2016 showed hiatal hernia as well as traction diverticulum below the carina, normal motility, flash penetration of 50 liquid.  Her GI doctor at that time did not advise endoscopy.  Swallow evaluation ordered by Dr. Verlon Au.  CXR 09/03/2022 shows  Small left pleural effusion and mild interstitial edema.  Correlate for any signs/symptoms of CHF.  2. Decreased aeration to the left base may reflect atelectasis or  airspace disease. Clinical Impression: Clinical Impression: Patient  was initially reluctant but particiapted fully in this MBS. SLP assessed her swallow with the following consistencies of barium: thin, nectar thick, puree. Full MBSimp protocol not followed secondary to patient's willingness and ability to participate. She exhibits a moderately impaired oral and mild-moderately impaired pharyngeal phase of swallow characterized by incomplete epiglottic inversion, weak tongue base retraction and reduced PES opening. i n addition, she appeared with xerostomia of oral cavity. These impairments led to prolonged anterior to posterior transit of bolus in oral cavity, mild and at times mild-moderate amount of barium residuals at base of tongue and vallecular sinus. PES opening appeared improved with larger bolus size (ie cup or straw sip as opposed to spoon). Although only partial epiglottic inversion was observed with initial spoon sip of thin liquids, full inversion was observed with all subsequent swallows.  No penetration or aspiration observed with any of the tested consistencies.  Patient did have incidents of wet sounding cough, but this was in the absence of any barium in laryngeal vestibule and was likely due to secretions. SLP recommending continue with full liquids, however plan to assess her toleration of advanced solids at bedside. Factors that may increase risk of adverse event in presence of aspiration ([place] 2021): Factors that may increase risk of adverse event in presence of aspiration ([place] 2021): Poor general health and/or compromised immunity; Frail or deconditioned; Weak cough; Reduced saliva Recommendations/Plan: Swallowing Evaluation Recommendations Swallowing Evaluation Recommendations Recommendations: PO diet PO Diet Recommendation: Full liquid diet; Thin liquids (Level 0) Liquid Administration via: Cup; Straw Medication Administration: Whole meds with puree Supervision: Full supervision/cueing for swallowing strategies; Full assist for feeding Swallowing strategies  : Slow rate; Small bites/sips; Check for pocketing or oral holding Postural changes: Position pt fully upright for meals; Stay upright 30-60 min after meals Oral care recommendations: Oral care BID (2x/day); Staff/trained caregiver to provide oral care Treatment Plan Treatment Plan Treatment recommendations: Therapy as outlined in treatment plan below Follow-up recommendations: Follow physicians's recommendations for discharge plan and follow up therapies Functional status assessment: Patient has had a recent decline in their functional status and demonstrates the ability to make significant improvements in function in a reasonable and predictable amount of time. Treatment frequency: Min 2x/week Treatment duration: 1 week Interventions: Compensatory techniques; Patient/family education; Trials of upgraded texture/liquids; Diet toleration management by SLP Recommendations Recommendations for follow up therapy are one component of a multi-disciplinary discharge planning process, led by the  attending physician.  Recommendations may be updated based on patient status, additional functional criteria and insurance authorization. Assessment: Orofacial Exam: Orofacial Exam Oral Cavity: Oral Hygiene: Xerostomia Oral Cavity - Dentition: Adequate natural dentition Orofacial Anatomy: WFL Anatomy: Anatomy: Prominent cricopharyngeus; Suspected cervical osteophytes Boluses Administered: Boluses Administered Boluses Administered: Thin liquids (Level 0); Mildly thick liquids (Level 2, nectar thick); Puree  Oral Impairment Domain: Oral Impairment Domain Lip Closure: Escape from interlabial space or lateral juncture, no extension beyond vermillion border Tongue control during bolus hold: Not tested Bolus transport/lingual motion: Slow tongue motion Oral residue: Trace residue lining oral structures Location of oral residue : Tongue; Palate Initiation of pharyngeal swallow : Valleculae  Pharyngeal Impairment Domain: Pharyngeal Impairment Domain Soft palate elevation: No bolus between soft palate (SP)/pharyngeal wall (PW) Laryngeal elevation: Complete superior movement of thyroid cartilage with complete approximation of arytenoids to epiglottic petiole Anterior hyoid excursion: Complete anterior movement Epiglottic movement: Partial inversion Laryngeal vestibule closure: Complete, no air/contrast in laryngeal vestibule Pharyngeal stripping wave : Present - complete Pharyngeal contraction (A/P  view only): N/A Pharyngoesophageal segment opening: Partial distention/partial duration, partial obstruction of flow Tongue base retraction: Trace column of contrast or air between tongue base and PPW Pharyngeal residue: Collection of residue within or on pharyngeal structures Location of pharyngeal residue: Valleculae; Tongue base  Esophageal Impairment Domain: Esophageal Impairment Domain Esophageal clearance upright position: Esophageal retention Pill: Esophageal Impairment Domain Esophageal clearance upright position:  Esophageal retention Penetration/Aspiration Scale Score: Penetration/Aspiration Scale Score 1.  Material does not enter airway: Thin liquids (Level 0); Mildly thick liquids (Level 2, nectar thick); Puree Compensatory Strategies: Compensatory Strategies Compensatory strategies: Yes Straw: Effective Effective Straw: Thin liquid (Level 0)   General Information: Caregiver present: No  Diet Prior to this Study: Full liquid diet   Temperature : Normal   Respiratory Status: WFL   Supplemental O2: None (Room air)   History of Recent Intubation: No  Behavior/Cognition: Alert; Cooperative Self-Feeding Abilities: Needs assist with self-feeding Baseline vocal quality/speech: Hypophonia/low volume No data recorded Volitional Swallow: Able to elicit Exam Limitations: No limitations Goal Planning: Prognosis for improved oropharyngeal function: Fair Barriers to Reach Goals: Severity of deficits; Behavior No data recorded Patient/Family Stated Goal: pt states "I'm dying" than says "I don't want to feel this way" Consulted and agree with results and recommendations: Pt unable/family or caregiver not available Pain: Pain Assessment Pain Assessment: No/denies pain End of Session: Start Time:SLP Start Time (ACUTE ONLY): 1110 Stop Time: SLP Stop Time (ACUTE ONLY): 1130 Time Calculation:SLP Time Calculation (min) (ACUTE ONLY): 20 min Charges: SLP Evaluations $ SLP Speech Visit: 1 Visit SLP Evaluations $BSS Swallow: 1 Procedure $MBS Swallow: 1 Procedure SLP visit diagnosis: SLP Visit Diagnosis: Dysphagia, oropharyngeal phase (R13.12) Past Medical History: Past Medical History: Diagnosis Date  Actinic keratoses   Arthritis   back   Breast cancer ([place])   Breast cancer (right)  Chronic diastolic CHF (congestive heart failure) (HCC)   CKD (chronic kidney disease), stage III (HCC)   Compression fracture of L3 lumbar vertebra   Dysphagia   GERD (gastroesophageal reflux disease)   takes otc Pepcid as needed  Hiatal hernia 11/27/2014  HTN  (hypertension)   Hypothyroidism   Insomnia   Osteopenia   PAF (paroxysmal atrial fibrillation) ([place])   a. questionable episode in 2015 in [place]. b. event monitor through December/January 2014/2015 which showed no evidence of atrial fibrillation. c. Dx 12/2018.  PAT (paroxysmal atrial tachycardia)   Pleural effusion  Past Surgical History: Past Surgical History: Procedure Laterality Date  BREAST LUMPECTOMY Right 1995  lumpectomy  COLONOSCOPY    ESOPHAGOGASTRODUODENOSCOPY (EGD) WITH PROPOFOL Left 01/09/2019  Procedure: ESOPHAGOGASTRODUODENOSCOPY (EGD) WITH PROPOFOL;  Surgeon: Carol Ada, MD;  Location: [facility];  Service: Endoscopy;  Laterality: Left;  EYE SURGERY Bilateral   cataract w/ lens implant  KYPHOPLASTY N/A 09/18/2013  Procedure: KYPHOPLASTY;  Surgeon: Sinclair Ship, MD;  Location: [facility];  Service: Orthopedics;  Laterality: N/A;  Lumbar 3 kyphoplasty  TONSILLECTOMY   Sonia Baller, MA, CCC-SLP Speech Therapy   DG ESOPHAGUS W SINGLE CM (SOL OR THIN BA)  Result Date: 09/04/2022 CLINICAL DATA:  Coughing after eating and drinking. Evaluate for an esophageal stricture. EXAM: ESOPHOGRAM/BARIUM SWALLOW TECHNIQUE: Single contrast examination was performed using  thin barium. FLUOROSCOPY: Radiation Exposure Index (as provided by the fluoroscopic device): 4.6 mGy Kerma COMPARISON:  None. FINDINGS: A very limited examination was performed in the recumbent LPO position, due to patient condition. Patient was only able to drink very minimal contrast with coughing after each swallow. Poor esophageal motility with  tertiary contractions. Suspect a distal esophageal stricture. Study was terminated due to excessive coughing and patient condition. Moderate to large hiatal hernia. IMPRESSION: 1. Very limited examination was performed due to patient condition, as detailed above. 2. Suspect a distal esophageal stricture. Tablet was not administered due to patient condition. 3. Esophageal dysmotility. 4. Moderate to  large hiatal hernia. Electronically Signed   By: Lorin Picket M.D.   On: 09/04/2022 12:01   DG Chest 2 View  Result Date: 09/03/2022 CLINICAL DATA:  Evaluate pneumonia. EXAM: CHEST - 2 VIEW COMPARISON:  09/01/22 FINDINGS: Stable cardiomediastinal contours. Lung volumes are low with mild asymmetric elevation of the right hemidiaphragm. Increase interstitial markings are identified bilaterally concerning for mild interstitial edema. Small left pleural effusion identified. Decreased aeration to the left base may reflect atelectasis or airspace disease. IMPRESSION: 1. Small left pleural effusion and mild interstitial edema. Correlate for any signs/symptoms of CHF. 2. Decreased aeration to the left base may reflect atelectasis or airspace disease. Electronically Signed   By: Kerby Moors M.D.   On: 09/03/2022 17:16     Scheduled Meds:  acetaminophen  1,000 mg Oral Q8H   feeding supplement  237 mL Oral BID BM   guaiFENesin  600 mg Oral BID   heparin  5,000 Units Subcutaneous Q8H   levothyroxine  50 mcg Oral QAC breakfast   metoprolol tartrate  12.5 mg Oral BID   pantoprazole  40 mg Oral Daily   polyethylene glycol  17 g Oral Daily   Continuous Infusions:  sodium chloride 75 mL/hr at 09/03/22 1051   azithromycin 500 mg (09/03/22 2332)   cefTRIAXone (ROCEPHIN)  IV 1 g (09/03/22 2027)     LOS: 2 days   Time spent: [place], MD [facility] To contact the attending provider between 7A-7P or the covering provider during after hours 7P-7A, please log into the web site [email or web address] and access using universal [place] password for that web site. If you do not have the password, please call the hospital operator.  09/04/2022, 1:34 PM

## 2022-09-04 NOTE — Plan of Care (Signed)

## 2022-09-05 ENCOUNTER — Encounter: Payer: Medicare Other | Admitting: Internal Medicine

## 2022-09-05 DIAGNOSIS — Z515 Encounter for palliative care: Secondary | ICD-10-CM

## 2022-09-05 DIAGNOSIS — J189 Pneumonia, unspecified organism: Secondary | ICD-10-CM | POA: Diagnosis not present

## 2022-09-05 DIAGNOSIS — Z7189 Other specified counseling: Secondary | ICD-10-CM

## 2022-09-05 DIAGNOSIS — R531 Weakness: Secondary | ICD-10-CM | POA: Diagnosis not present

## 2022-09-05 LAB — RENAL FUNCTION PANEL
Albumin: 2.1 g/dL — ABNORMAL LOW (ref 3.5–5.0)
Anion gap: 5 (ref 5–15)
BUN: 15 mg/dL (ref 8–23)
CO2: 22 mmol/L (ref 22–32)
Calcium: 7.9 mg/dL — ABNORMAL LOW (ref 8.9–10.3)
Chloride: 110 mmol/L (ref 98–111)
Creatinine, Ser: 1.16 mg/dL — ABNORMAL HIGH (ref 0.44–1.00)
GFR, Estimated: 43 mL/min — ABNORMAL LOW (ref >60)
Glucose, Bld: 77 mg/dL (ref 70–99)
Phosphorus: 3.4 mg/dL (ref 2.5–4.6)
Potassium: 4.1 mmol/L (ref 3.5–5.1)
Sodium: 137 mmol/L (ref 135–145)

## 2022-09-05 LAB — LEGIONELLA PNEUMOPHILA SEROGP 1 UR AG: L. pneumophila Serogp 1 Ur Ag: NEGATIVE

## 2022-09-05 MED ORDER — MAGIC MOUTHWASH
2.0000 mL | Freq: Three times a day (TID) | ORAL | Status: DC
  Administered 2022-09-05 – 2022-09-07 (×5): 2 mL via ORAL
  Filled 2022-09-05 (×7): qty 5

## 2022-09-05 MED ORDER — CEFDINIR 300 MG PO CAPS
300.0000 mg | ORAL_CAPSULE | Freq: Two times a day (BID) | ORAL | Status: DC
  Administered 2022-09-05 – 2022-09-07 (×4): 300 mg via ORAL
  Filled 2022-09-05 (×4): qty 1

## 2022-09-05 NOTE — Progress Notes (Signed)
A user error has taken place.

## 2022-09-05 NOTE — [place] FL2 (Signed)
[place] MEDICAID FL2 LEVEL OF CARE FORM     IDENTIFICATION  Patient Name: Cassandra Ray Birthdate: [birth year] Sex: female Admission Date (Current Location): 09/01/2022  [facility] and [place] Number:  Herbalist and Address:  [facility] [place] [facility],  [place], [place]      Provider Number: [id number]  Attending Physician Name and Address:  Nita Sells, MD  Relative Name and Phone Number:  Purvis Sheffield [phone]    Current Level of Care: Hospital Recommended Level of Care: Assisted Living Facility Prior Approval Number:    Date Approved/Denied:   PASRR Number:    Discharge Plan: Other (Comment) (ALF)    Current Diagnoses: Patient Active Problem List   Diagnosis Date Noted   Left lower lobe pneumonia 09/02/2022   Transaminitis 09/02/2022   CKD (chronic kidney disease), stage IV 09/02/2022   CAP (community acquired pneumonia) 09/02/2022   Chronic kidney disease, stage III (moderate) 08/09/2021   Upper GI bleed 01/08/2019   Atrial fibrillation with RVR    Paroxysmal atrial fibrillation 12/12/2018   Acute kidney injury superimposed on chronic kidney disease 12/12/2018   Leukocytosis 12/12/2018   Gastroesophageal reflux disease    Weakness    Tachycardia 05/24/2018   Acute respiratory failure with hypoxia 05/23/2018   Bilateral impacted cerumen 11/22/2016   Presbycusis of both ears 11/22/2016   Pleural effusion, right 02/08/2016   Acute on chronic diastolic CHF (congestive heart failure) 02/08/2016   Lower extremity edema 01/31/2016   Chronic heart failure with preserved ejection fraction (HFpEF) 02/03/2015   Hyponatremia 04/25/2014   Shortness of breath    Compression fracture 09/18/2013   Knee pain 08/11/2013   Back pain 05/09/2013   Essential hypertension    Hypothyroidism     Orientation RESPIRATION BLADDER Height & Weight     Self, Place  Normal Incontinent Weight: 142 lb (64.4 kg) Height:  5' 3.8"  (162.1 cm)  BEHAVIORAL SYMPTOMS/MOOD NEUROLOGICAL BOWEL NUTRITION STATUS      Continent Diet (Regular)  AMBULATORY STATUS COMMUNICATION OF NEEDS Skin   Limited Assist Verbally Normal                       Personal Care Assistance Level of Assistance  Bathing, Feeding, Dressing Bathing Assistance: Independent Feeding assistance: Independent Dressing Assistance: Independent     Functional Limitations Info  Sight, Hearing, Speech Sight Info: Adequate Hearing Info: Impaired Speech Info: Adequate    SPECIAL CARE FACTORS FREQUENCY                       Contractures Contractures Info: Not present    Additional Factors Info  Code Status, Allergies Code Status Info: DNR Allergies Info: Cardizem (Diltiazem), Eliquis (Apixaban), Tape           Current Medications (09/05/2022):  This is the current hospital active medication list Current Facility-Administered Medications  Medication Dose Route Frequency Provider Last Rate Last Admin   0.9 %  sodium chloride infusion   Intravenous Continuous Nita Sells, MD 50 mL/hr at 09/04/22 1837 Infusion Verify at 09/04/22 1837   acetaminophen (TYLENOL) tablet 1,000 mg  1,000 mg Oral Q8H Samtani, Jai-Gurmukh, MD   1,000 mg at 09/05/22 0521   albuterol (PROVENTIL) (2.5 MG/3ML) 0.083% nebulizer solution 2.5 mg  2.5 mg Nebulization Q6H PRN Shela Leff, MD   2.5 mg at 09/05/22 0529   azithromycin (ZITHROMAX) 500 mg in sodium chloride 0.9 % 250 mL IVPB  500 mg Intravenous Q24H Shela Leff, MD 250 mL/hr at 09/05/22 0039 500 mg at 09/05/22 0039   cefTRIAXone (ROCEPHIN) 1 g in sodium chloride 0.9 % 100 mL IVPB  1 g Intravenous Q24H Shela Leff, MD 200 mL/hr at 09/04/22 2216 1 g at 09/04/22 2216   feeding supplement (ENSURE ENLIVE / ENSURE PLUS) liquid 237 mL  237 mL Oral BID BM Nita Sells, MD   237 mL at 09/04/22 1037   guaiFENesin (MUCINEX) 12 hr tablet 600 mg  600 mg Oral BID Shela Leff, MD   600  mg at 09/05/22 1031   heparin injection 5,000 Units  5,000 Units Subcutaneous Q8H Shela Leff, MD   5,000 Units at 09/03/22 1508   levothyroxine (SYNTHROID) tablet 50 mcg  50 mcg Oral QAC breakfast Nita Sells, MD   50 mcg at 09/05/22 0521   lip balm (CARMEX) ointment   Topical PRN Nita Sells, MD       melatonin tablet 5 mg  5 mg Oral QHS PRN Nita Sells, MD   5 mg at 09/05/22 0041   methocarbamol (ROBAXIN) tablet 250 mg  250 mg Oral BID PRN Nita Sells, MD       metoprolol tartrate (LOPRESSOR) tablet 12.5 mg  12.5 mg Oral BID Nita Sells, MD   12.5 mg at 09/05/22 1031   nitroGLYCERIN (NITROSTAT) SL tablet 0.4 mg  0.4 mg Sublingual Q5 min PRN Nita Sells, MD       ondansetron (ZOFRAN) tablet 4 mg  4 mg Oral Q6H PRN Nita Sells, MD   4 mg at 09/03/22 0947   pantoprazole (PROTONIX) EC tablet 40 mg  40 mg Oral Daily Nita Sells, MD   40 mg at 09/05/22 1031   polyethylene glycol (MIRALAX / GLYCOLAX) packet 17 g  17 g Oral Daily Nita Sells, MD   17 g at 09/04/22 1036     Discharge Medications: Please see discharge summary for a list of discharge medications.  Relevant Imaging Results:  Relevant Lab Results:   Additional Information SSN: [id number]  Vassie Moselle, LCSW

## 2022-09-05 NOTE — Consult Note (Signed)
Consultation Note Date: 09/05/2022   Patient Name: Cassandra Ray  DOB: [birth year]  MRN: [id number]  Age / Sex: 89 y.o., female  PCP: Lawerance Cruel, MD Referring Physician: Nita Sells, MD  Reason for Consultation: Establishing goals of care  HPI/Patient Profile: 89 y.o. female   admitted on 09/01/2022   89 year old white female [facility] AL[facility] dwelling--semiindependent at baseline uses walker and wheelchair husband lives with her at age 63 and helps her mobilize Prior breast cancer, right-sided Permanent A-fib previously on Eliquis-stopped because of [name] lesions on EGD 01/09/2019 and dark stool Hemoccult positive-follows with Dr. [name] of cardiology Hypothyroidism on Synthroid CKD 3B HFpEF EF 60-65% 12/2018 Uncontrolled hypertension previously   Clinical Assessment and Goals of Care:  Cassandra Ray is a 89 y.o. female with long history of dysphagia with concerns of aspiration pneumonia on IV Abx. Full liquid diet recommended by speech path. Barium swallow limited study and question of distal esophageal stricture. Poor esophageal motility with tertiary contractions. Moderate to large hiatal hernia. No aspiration or penetration during modified barium swallow.    Remains admitted to hospital medicine service with aspiration PNA, hiatal hernia oropharyngeal dysphagia and possible esophageal stricture, seen by GI, now on full liquids.   Palliative consult for goals of care discussions.   Patient is resting in bed, husband is at bedside.   I introduced myself and palliative care as follows: Palliative medicine is specialized medical care for people living with serious illness. It focuses on providing relief from the symptoms and stress of a serious illness. The goal is to improve quality of life for both the patient and the family. Goals of care: Broad aims of medical therapy in relation to  the patient's values and preferences. Our aim is to provide medical care aimed at enabling patients to achieve the goals that matter most to them, given the circumstances of their particular medical situation and their constraints.   Discussed about SLP and GI recommendations, patient doesn't want to undergo endoscopy or feeding tube, we talked about monitoring PO intake and tracking her weights, having palliative care at her facility to monitor her overall disease trajectory.   NEXT OF KIN  Husband.   SUMMARY OF RECOMMENDATIONS    DNR Recommend addition of palliative services at [facility] facility on discharge.  Thank you for the consult.   Code Status/Advance Care Planning: DNR   Symptom Management:     Palliative Prophylaxis:  Bowel Regimen  Additional Recommendations (Limitations, Scope, Preferences): Patient declines endoscopy, declines feeding tubes. Asks about IV fluids if needed.   Psycho-social/Spiritual:  Desire for further Chaplaincy support:yes Additional Recommendations: Caregiving  Support/Resources  Prognosis:  < 12 months  Discharge Planning:  recommend palliative services at [facility].        Primary Diagnoses: Present on Admission:  Left lower lobe pneumonia  Hyponatremia  CAP (community acquired pneumonia)   I have reviewed the medical record, interviewed the patient and family, and examined the patient. The following aspects are pertinent.  Past Medical History:  Diagnosis  Date   Actinic keratoses    Arthritis    back    Breast cancer ([place])    Breast cancer (right)   Chronic diastolic CHF (congestive heart failure) (HCC)    CKD (chronic kidney disease), stage III (HCC)    Compression fracture of L3 lumbar vertebra    Dysphagia    GERD (gastroesophageal reflux disease)    takes otc Pepcid as needed   Hiatal hernia 11/27/2014   HTN (hypertension)    Hypothyroidism    Insomnia    Osteopenia    PAF (paroxysmal atrial fibrillation) ([place])     a. questionable episode in 2015 in [place]. b. event monitor through December/January 2014/2015 which showed no evidence of atrial fibrillation. c. Dx 12/2018.   PAT (paroxysmal atrial tachycardia)    Pleural effusion    Social History   Socioeconomic History   Marital status: Married    Spouse name: Not on file   Number of children: Not on file   Years of education: Not on file   Highest education level: Not on file  Occupational History   Not on file  Tobacco Use   Smoking status: Former   Smokeless tobacco: Never   Tobacco comments:    only in college  Vaping Use   Vaping Use: Never used  Substance and Sexual Activity   Alcohol use: Yes    Alcohol/week: 7.0 standard drinks of alcohol    Types: 7 Glasses of wine per week    Comment: 1 glass of wine a day   Drug use: No   Sexual activity: Not on file  Other Topics Concern   Not on file  Social History Narrative   Not on file   Social Determinants of Health   Financial Resource Strain: Not on file  Food Insecurity: No Food Insecurity (09/02/2022)   Hunger Vital Sign    Worried About Running Out of Food in the Last Year: Never true    Ran Out of Food in the Last Year: Never true  Transportation Needs: No Transportation Needs (09/02/2022)   PRAPARE - Hydrologist (Medical): No    Lack of Transportation (Non-Medical): No  Physical Activity: Not on file  Stress: Not on file  Social Connections: Not on file   Family History  Problem Relation Age of Onset   CAD Mother    Diabetes Mellitus II Mother    Heart failure Mother    Heart failure Father    Prostate cancer Brother    Kidney cancer Brother    Heart disease Sister    Esophageal cancer Neg Hx    Colon cancer Neg Hx    Scheduled Meds:  acetaminophen  1,000 mg Oral Q8H   feeding supplement  237 mL Oral BID BM   guaiFENesin  600 mg Oral BID   heparin  5,000 Units Subcutaneous Q8H   levothyroxine  50 mcg Oral QAC breakfast    metoprolol tartrate  12.5 mg Oral BID   pantoprazole  40 mg Oral Daily   polyethylene glycol  17 g Oral Daily   Continuous Infusions:  sodium chloride 50 mL/hr at 09/04/22 1837   azithromycin 500 mg (09/05/22 0039)   cefTRIAXone (ROCEPHIN)  IV 1 g (09/04/22 2216)   PRN Meds:.albuterol, lip balm, melatonin, methocarbamol, nitroGLYCERIN, ondansetron Medications Prior to Admission:  Prior to Admission medications   Medication Sig Start Date End Date Taking? Authorizing Provider  acetaminophen (TYLENOL) 500 MG tablet Take 2 tablets (  1,000 mg total) by mouth every 8 (eight) hours. Patient taking differently: Take 650 mg by mouth every 8 (eight) hours. 01/21/21  Yes Royal Hawthorn, NP  amiodarone (PACERONE) 200 MG tablet Take 1 tablet (200 mg total) by mouth daily. Please keep upcoming appt for future refills Patient taking differently: Take 200 mg by mouth daily. 07/29/21  Yes Jerline Pain, MD  amoxicillin-clavulanate (AUGMENTIN) 875-125 MG tablet Take 1 tablet by mouth 2 (two) times daily. Patient not taking: Reported on 09/04/2022 08/26/22  Yes [provider]  B Complex-C (B-COMPLEX WITH VITAMIN C) tablet Take 1 tablet by mouth daily.   Yes [provider]  benzonatate (TESSALON) 100 MG capsule Take 100 mg by mouth 3 (three) times daily as needed for cough.   Yes [provider]  Cholecalciferol (VITAMIN D3) 2000 units TABS Take 2,000 Units by mouth daily.    Yes [provider]  Dextromethorphan-guaiFENesin (ROBAFEN DM) 10-100 MG/5ML liquid Take 10 mLs by mouth every 6 (six) hours as needed (Cough). Patient not taking: Reported on 09/04/2022   Yes [provider]  diphenhydramine-acetaminophen (TYLENOL PM) 25-500 MG TABS tablet Take 1 tablet by mouth at bedtime as needed ([place]). Patient not taking: Reported on 09/04/2022   Yes [provider]  furosemide (LASIX) 20 MG tablet Take 1 tablet (20 mg total) by mouth daily. 08/09/21  Yes Jerline Pain, MD  guaiFENesin (MUCINEX) 600 MG 12 hr tablet Take 600 mg by mouth in the morning.   Yes [provider]  guaiFENesin (MUCINEX) 600 MG 12 hr tablet Take 600 mg by mouth daily as needed for cough.   Yes [provider]  ipratropium-albuterol (DUONEB) 0.5-2.5 (3) MG/3ML SOLN Take 3 mLs by nebulization 2 (two) times daily as needed (Wheezing/SOB). 08/23/22  Yes [provider]  ipratropium-albuterol (DUONEB) 0.5-2.5 (3) MG/3ML SOLN Take 3 mLs by nebulization 4 (four) times daily.   Yes [provider]  levothyroxine (SYNTHROID) 112 MCG tablet Take 112 mcg by mouth every morning. 07/01/20  Yes [provider]  melatonin 5 MG TABS Take 5 mg by mouth at bedtime as needed (Sleep).   Yes [provider]  metoprolol tartrate (LOPRESSOR) 25 MG tablet Take 25 mg by mouth in the morning.   Yes [provider]  nitroGLYCERIN (NITROSTAT) 0.4 MG SL tablet Place 1 tablet (0.4 mg total) under the tongue every 5 (five) minutes as needed for chest pain. 01/05/20  Yes Jerline Pain, MD  ondansetron (ZOFRAN) 4 MG tablet Take 4 mg by mouth every 6 (six) hours as needed for nausea or vomiting.   Yes [provider]  pantoprazole (PROTONIX) 40 MG tablet Take 1 tablet (40 mg total) by mouth daily. 01/11/19  Yes Hall, Archie Patten N, DO  polyethylene glycol powder (GLYCOLAX/MIRALAX) 17 GM/SCOOP powder Take 17 g by mouth daily. Patient taking differently: Take 17 g by mouth daily as needed for moderate constipation. 01/21/21  Yes Wert, Margreta Journey, NP  Probiotic Product (ALIGN) 4 MG CAPS Take 4 mg by mouth daily.   Yes [provider]  sennosides-docusate sodium (SENOKOT-S) 8.6-50 MG tablet Take 1 tablet by mouth at bedtime as needed for constipation. Patient taking differently: Take 2 tablets by mouth at bedtime as needed for constipation. 01/21/21  Yes Royal Hawthorn, NP   Allergies  Allergen Reactions   Cardizem [Diltiazem]    Eliquis [Apixaban]     Tape Other (See Comments)    Patient prefers easy-release tape   Review  of Systems +cough +weakness Hard of hearing  Physical Exam Elderly lady resting in bed Has cough, has coarse breath sounds.     Abdomen not distended  Vital Signs: BP (!) 155/74 (BP Location: Left Arm)   Pulse 64   Temp 97.7 F (36.5 C) (Oral)   Resp 18   Ht 5' 3.8" (1.621 m)   Wt 64.4 kg   LMP  (LMP Unknown)   SpO2 96%   BMI 24.53 kg/m  Pain Scale: 0-10   Pain Score: Asleep   SpO2: SpO2: 96 % O2 Device:SpO2: 96 % O2 Flow Rate: .O2 Flow Rate (L/min): 2 L/min  IO: Intake/output summary:  Intake/Output Summary (Last 24 hours) at 09/05/2022 1528 Last data filed at 09/05/2022 1300 Gross per 24 hour  Intake 3099.71 ml  Output 650 ml  Net 2449.71 ml    LBM: Last BM Date : 09/03/22 Baseline Weight: Weight: 64.4 kg Most recent weight: Weight: 64.4 kg     Palliative Assessment/Data:   PPS 50%  Time In:  1430 Time Out:  1530 Time Total:  60  Greater than 50%  of this time was spent counseling and coordinating care related to the above assessment and plan.  Signed by: Loistine Chance, MD   Please contact Palliative Medicine Team phone at [phone] for questions and concerns.  For individual provider: See Shea Evans

## 2022-09-05 NOTE — TOC Initial Note (Signed)
Transition of Care [facility]) - Initial/Assessment Note    Patient Details  Name: Cassandra Ray MRN: [id number] Date of Birth: 1922-09-03  Transition of Care [facility]) CM/SW Contact:    Vassie Moselle, LCSW Phone Number: 09/05/2022, 2:25 PM  Clinical Narrative:                 Met with pt and spouse at bedside and confirmed pt currently resides at [facility]F with her spouse. Pt has a RW and wheelchair available at ALF.  Palliative care has been consulted. TOC will continue to follow for recommendations.   Expected Discharge Plan: Assisted Living Barriers to Discharge: Continued Medical Work up   Patient Goals and CMS Choice Patient states their goals for this hospitalization and ongoing recovery are:: For pt to return to [employer].gov Compare Post Acute Care list provided to:: Patient Represenative (must comment) (Spouse) Choice offered to / list presented to : [place] ownership interest in [facility].provided to:: Spouse    Expected Discharge Plan and Services In-house Referral: NA Discharge Planning Services: NA Post Acute Care Choice: NA Living arrangements for the past 2 months: [place]                 DME Arranged: N/A DME Agency: NA                  Prior Living Arrangements/Services Living arrangements for the past 2 months: [place] Lives with:: Spouse Patient language and need for interpreter reviewed:: Yes Do you feel safe going back to the place where you live?: Yes      Need for Family Participation in Patient Care: Yes (Comment) Care giver support system in place?: Yes (comment) Current home services:  (RW, WC) Criminal Activity/Legal Involvement Pertinent to Current Situation/Hospitalization: No - Comment as needed  Activities of Daily Living   ADL Screening (condition at time of admission) Is the patient deaf or have difficulty hearing?: Yes Does the patient have difficulty  seeing, even when wearing glasses/contacts?: No Does the patient have difficulty concentrating, remembering, or making decisions?: No Does the patient have difficulty dressing or bathing?: Yes Does the patient have difficulty walking or climbing stairs?: Yes  Permission Sought/Granted Permission sought to share information with : Facility Sport and exercise psychologist, Family Supports Permission granted to share information with : Yes, Verbal Permission Granted  Share Information with NAME: Tycee Blamer  Permission granted to share info w AGENCY: [facility]  Permission granted to share info w Relationship: Spouse     Emotional Assessment Appearance:: Appears younger than stated age Attitude/Demeanor/Rapport: Engaged Affect (typically observed): Accepting Orientation: : Oriented to Self, Oriented to Place Alcohol / Substance Use: Not Applicable Psych Involvement: No (comment)  Admission diagnosis:  CAP (community acquired pneumonia) [J18.9] Patient Active Problem List   Diagnosis Date Noted   Left lower lobe pneumonia 09/02/2022   Transaminitis 09/02/2022   CKD (chronic kidney disease), stage IV 09/02/2022   CAP (community acquired pneumonia) 09/02/2022   Chronic kidney disease, stage III (moderate) 08/09/2021   Upper GI bleed 01/08/2019   Atrial fibrillation with RVR    Paroxysmal atrial fibrillation 12/12/2018   Acute kidney injury superimposed on chronic kidney disease 12/12/2018   Leukocytosis 12/12/2018   Gastroesophageal reflux disease    Weakness    Tachycardia 05/24/2018   Acute respiratory failure with hypoxia 05/23/2018   Bilateral impacted cerumen 11/22/2016   Presbycusis of both ears 11/22/2016   Pleural effusion, right 02/08/2016  Acute on chronic diastolic CHF (congestive heart failure) 02/08/2016   Lower extremity edema 01/31/2016   Chronic heart failure with preserved ejection fraction (HFpEF) 02/03/2015   Hyponatremia 04/25/2014   Shortness of breath     Compression fracture 09/18/2013   Knee pain 08/11/2013   Back pain 05/09/2013   Essential hypertension    Hypothyroidism    PCP:  Lawerance Cruel, MD Pharmacy:   [place], [place] [address] Phone: [phone] Fax: [phone]  [facility] [phone] - [place], [address] [place] [place] [address] Phone: [phone] Fax: [phone]  [place], [place] - [address] [place] [address] Phone: [phone] Fax: [phone]     Social Determinants of Health (SDOH) Social History: SDOH Screenings   Food Insecurity: No Food Insecurity (09/02/2022)  Transportation Needs: No Transportation Needs (09/02/2022)  Utilities: Not At Risk (09/02/2022)  Tobacco Use: Medium Risk (09/01/2022)   SDOH Interventions:     Readmission Risk Interventions    09/05/2022    2:23 PM  Readmission Risk Prevention Plan  Transportation Screening Complete  PCP or Specialist Appt within 5-7 Days Complete  Home Care Screening Complete  Medication Review (RN CM) Complete

## 2022-09-05 NOTE — Progress Notes (Signed)
Speech Language Pathology Treatment: Dysphagia  Patient Details Name: Cassandra Ray MRN: [id number] DOB: Jun 21, 1922 Today's Date: 09/05/2022 Time: 1550-1640 SLP Time Calculation (min) (ACUTE ONLY): 50 min  Assessment / Plan / Recommendation Clinical Impression  SLP visit to address dysphagia goals, spouse present. SLP reviewed prior MBS from 2019 and current MBS with spouse as patient was sleeping. Patient did take with skin lesion but admitted that she was tired. Intake has continued to be poor and RN reports patient is coughing with thin liquid intake but tolerating pudding. She has only consumed a pudding cup yesterday- declining more po. WIth RN, pt stopped intake after coughing with thin, stating "I do not think I should drink more of that". She hesitantly consumed Ensure mixed with ice cream, tsp, cup and straw sip of soda, and thin water via cup and tsp. Swallow appears minimally delayed, likely resulting in patient having inconsistent aspiration. Patient consumed approximately 8 straw sips of Ensure, after which she had a subtle dry cough. Furhter po trials included nectar thick gingerale, which caused patient discomfort as she winced when she swallows. She politely declined to consume more ginger. Minimal oral retention of liquids and secretions observed with minimal spillage from lateral labial creases (not observed during prior MBS in 2019). An additional neck extension noted with swallows and patient states she does this to help get the food and liquid down causing SLP to suspect compensation for lingual will oral weakness. Thin liquid intake via cup resulted in overt coughing after third swallow concerning for laryngeal infiltration/? aspiration. Cough was productive to minimal secretion- causing significant exhaustion after clearance. She tolerates teaspoon amounts of thin liquid better than straw or cup sips. SLP offered patient solids to which she declined stating she was not ready and  preferred to stay on liquid diet. At this time to help maximize patient's nutrition would recommend in to continue full liquid diet per her wishes and encouraged her to can seem ensure it with ice cream with each of her meals. Thin water between meals advised to have her maintain hydration and comfort and to continue to help with the adjust to current medicine liquid. Patient and spouse educated to aspiration and dysmotility precautions, importance of respiration and swallow reciprocity thus making sure and is taking breaks as needed and try to eat several small meals. Spouse and patient agreeable to plan. SLP is concerned for patient's adequacy of po given level of deconditioning with advanced age, dysphagia and dyspnea with minimal intake.    Received notification from MD for concern for diet advancement, advised patient declined to have a diet advanced today.  At this time likely nutrition seems to be most efficient for this patient to consume and best to honor her wishes recommend continue full liquid diet.   HPI HPI: Patient is a 89 year old female admitted to [facility] long hospital with left lower lobe pneumonia, hyponatremia.  Patient has history of GERD, breast cancer, dysphagia, hiatal hernia.  She was treated for pneumonia several days prior to admission but did not improve.  Prior esophagram in 2016 showed hiatal hernia as well as traction diverticulum below the carina, normal motility, flash penetration of 50 liquid.  Her GI doctor at that time did not advise endoscopy.  Swallow evaluation ordered by Dr. Verlon Au.  CXR 09/03/2022 shows  Small left pleural effusion and mild interstitial edema.  Correlate for any signs/symptoms of CHF.  2. Decreased aeration to the left base may reflect atelectasis or  airspace disease.  Patient  underwent MBS study and esophagram yesterday, her oropharyngeal dysphagia is worse compared to 2019 and Dr. Michail Sermon documents suspicion for esophageal dysmotility as source of  her symptoms.  It was not advised for her to have endoscopy due to her age and multiple risk factors.  Patient has been maintained on a full liquid diet.      SLP Plan  Continue with current plan of care      Recommendations for follow up therapy are one component of a multi-disciplinary discharge planning process, led by the attending physician.  Recommendations may be updated based on patient status, additional functional criteria and insurance authorization.    Recommendations  Diet recommendations: Thin liquid;Nectar-thick liquid Liquids provided via: Cup;Teaspoon;Straw Medication Administration: Whole meds with puree Compensations: Slow rate;Small sips/bites Postural Changes and/or Swallow Maneuvers: Seated upright 90 degrees;Upright 30-60 min after meal                  Oral care BID     Dysphagia, oropharyngeal phase (R13.12)  Esophageal    Continue with current plan of care   Cassandra Lime, MS [place] Office [phone]   Cassandra Ray  09/05/2022, 5:10 PM

## 2022-09-05 NOTE — Progress Notes (Signed)
PROGRESS NOTE   Cassandra Ray  [id number] DOB: August 16, 1922 DOA: 09/01/2022 PCP: Lawerance Cruel, MD  Brief Narrative:   89 year old white female [facility]--semiindependent at baseline uses walker and wheelchair husband lives with her at age 82 and helps her mobilize Prior breast cancer, right-sided Permanent A-fib previously on Eliquis-stopped because of [name] lesions on EGD 01/09/2019 and dark stool Hemoccult positive-follows with Dr. [name] of cardiology Hypothyroidism on Synthroid CKD 3B HFpEF EF 60-65% 12/2018 Uncontrolled hypertension previously   Presented from [facility] with outpatient failure pneumonia and in setting of having been seen 3/17 at [facility] primary care and starting on Augmentin she appears to have finished her last dose of Augmentin on 3/29-day of arrival at the [facility] and had been placed on supplemental oxygen in the outpatient setting-   Seen by admitting physician started on antibiotics fluids 4/1 esophagram very limited examination due to patient condition distal esophageal stricture?-Was coughing throughout exam so tablet was not administered tertiary contractions of esophagus noted moderate to large hiatal hernia noted  3/31 2 view CXR = bilateral aspiration 3/30 discussed with [facility] cardiology who recommends to completely discontinue amnio 4/1 GI consult do not recommend endoscopy-high risk for complication with instrumentation  Hospital-Problem based course  Aspiration pneumonia on admission-risk factors hiatal hernia oropharyngeal dysphagia and possible esophageal stricture SLP c at this time recommends thin full liquid diet, strict aspiration precautions MBS and esophagram  Esophagram could not be completed because of patient coughing during procedure and tablet was not given GI does not recommend intervention Patient is already DNR may need to have palliative care follow-up at the facility once therapy can evaluate from a  physical perspective --> to oral cefpodoxime 300 twice daily from IV antibiotics, get 2 view x-ray in the morning Suspect she will continue to aspirate and goals of care consolidation will need to occur once she returns to facility  Tea-toast potomania with hyponatremia recurring=--current serum Osmo = 270 indicative of this Restrict fluids to 1500 cc - Saline lock today She is not eating much --discussed this clearly with her husband that the decline may occur again  Mild AKI on admit superimposed on CKD 3B Metabolic acidosis and lactic acidosis from this and not infection Resolving-saline as above  Permanent A-fib CHADVASC >4 has bled score >4-is not on Eliquis secondary to GI bleed discontinue amnio after discussion with cardiology given age and risk factors Recommends to reconsult cardiology if further issues with rate control etc. At this time continuing Lopressor 12.5 twice daily only  Transaminitis Etiology possibly secondary to amiodarone which was cut back from LFTs slowly trending down All labs repeat a.m.  Hyperthyroid state in the setting of prior hypothyroidism Would keep thyroxine current dose 50 given hypermetabolic state of infection Needs TSH in 2 to 3 weeks outpatient  Prior breast cancer right-sided Restricted right arm outpatient surveillance per PCP  DVT prophylaxis: Heparin Code Status: DNR confirmed at the bedside Family Communication: Discussed with the patient's husband Henya Crisman at the bedside  on 4/1-his phone number is [phone] Disposition:  Status is: Inpatient Remains inpatient appropriate because:   Re-Quires input from speech and may require input from gastroenterology depending on MBS     Subjective:  Is not really improved overall-continues to have bad wheeze and cough I had an in-depth discussion with her husband and have asked palliative care to talk to patient as well Nursing reports skin tear on right lower  extremity  Objective: Vitals:   09/04/22  1933 09/04/22 2214 09/05/22 0529 09/05/22 0615  BP: (!) 145/71 (!) 158/67  130/60  Pulse: 74 72  68  Resp: 17   17  Temp: 98 F (36.7 C)   (!) 97.4 F (36.3 C)  TempSrc: Oral   Oral  SpO2: 98%  96% 99%  Weight:      Height:        Intake/Output Summary (Last 24 hours) at 09/05/2022 1103 Last data filed at 09/04/2022 2215 Gross per 24 hour  Intake 2979.71 ml  Output 650 ml  Net 2329.71 ml    Filed Weights   09/01/22 1925  Weight: 64.4 kg    Examination:  Ill-appearing white female wheezy from bedside Very hard of hearing Abdomen soft no rebound no guarding cannot appreciate organomegaly No lower extremity edema although some excoriations to anterior shins Neuro intact moving 4 limbs equally Skin tear over right shin  Data Reviewed: personally reviewed   CBC    Component Value Date/Time   WBC 7.8 09/03/2022 0642   RBC 3.66 (L) 09/03/2022 0642   HGB 11.8 (L) 09/03/2022 0642   HGB 13.2 09/21/2020 1358   HCT 35.0 (L) 09/03/2022 0642   HCT 39.6 09/21/2020 1358   PLT PLATELET CLUMPS NOTED ON SMEAR, UNABLE TO ESTIMATE 09/03/2022 0642   PLT 172 09/21/2020 1358   MCV 95.6 09/03/2022 0642   MCV 98 (H) 09/21/2020 1358   MCH 32.2 09/03/2022 0642   MCHC 33.7 09/03/2022 0642   RDW 14.3 09/03/2022 0642   RDW 12.2 09/21/2020 1358   LYMPHSABS 1.1 09/03/2022 0642   MONOABS 1.0 09/03/2022 0642   EOSABS 0.4 09/03/2022 0642   BASOSABS 0.0 09/03/2022 0642      Latest Ref Rng & Units 09/05/2022    5:56 AM 09/04/2022    7:50 AM 09/03/2022    6:42 AM  CMP  Glucose 70 - 99 mg/dL 77  75  89   BUN 8 - 23 mg/dL 15  14  18    Creatinine 0.44 - 1.00 mg/dL 1.16  1.15  1.31   Sodium 135 - 145 mmol/L 137  134  130   Potassium 3.5 - 5.1 mmol/L 4.1  3.8  3.8   Chloride 98 - 111 mmol/L 110  106  101   CO2 22 - 32 mmol/L 22  22  23    Calcium 8.9 - 10.3 mg/dL 7.9  8.4  8.3   Total Protein 6.5 - 8.1 g/dL  6.1  5.5   Total Bilirubin 0.3 - 1.2 mg/dL   1.0  0.9   Alkaline Phos 38 - 126 U/L  161  151   AST 15 - 41 U/L  208  239   ALT 0 - 44 U/L  220  240      Radiology Studies: DG Swallowing Func-Speech Pathology  Result Date: 09/04/2022 Table formatting from the original result was not included. Modified Barium Swallow Study Patient Details Name: Cassandra Ray MRN: [id number] Date of Birth: 09-18-1922 Today's Date: 09/04/2022 HPI/PMH: HPI: Patient is a 89 year old female admitted to [facility] with left lower lobe pneumonia, hyponatremia.  Patient has history of GERD, breast cancer, dysphagia, hiatal hernia.  She was treated for pneumonia several days prior to admission but did not improve.  Prior esophagram in 2016 showed hiatal hernia as well as traction diverticulum below the carina, normal motility, flash penetration of 50 liquid.  Her GI doctor at that time did not advise endoscopy.  Swallow evaluation ordered by Dr. Verlon Au.  CXR 09/03/2022 shows  Small left pleural effusion and mild interstitial edema.  Correlate for any signs/symptoms of CHF.  2. Decreased aeration to the left base may reflect atelectasis or  airspace disease. Clinical Impression: Clinical Impression: Patient was initially reluctant but particiapted fully in this MBS. SLP assessed her swallow with the following consistencies of barium: thin, nectar thick, puree. Full MBSimp protocol not followed secondary to patient's willingness and ability to participate. She exhibits a moderately impaired oral and mild-moderately impaired pharyngeal phase of swallow characterized by incomplete epiglottic inversion, weak tongue base retraction and reduced PES opening. i n addition, she appeared with xerostomia of oral cavity. These impairments led to prolonged anterior to posterior transit of bolus in oral cavity, mild and at times mild-moderate amount of barium residuals at base of tongue and vallecular sinus. PES opening appeared improved with larger bolus size (ie cup or straw sip as  opposed to spoon). Although only partial epiglottic inversion was observed with initial spoon sip of thin liquids, full inversion was observed with all subsequent swallows.  No penetration or aspiration observed with any of the tested consistencies. Patient did have incidents of wet sounding cough, but this was in the absence of any barium in laryngeal vestibule and was likely due to secretions. SLP recommending continue with full liquids, however plan to assess her toleration of advanced solids at bedside. Factors that may increase risk of adverse event in presence of aspiration ([place] 2021): Factors that may increase risk of adverse event in presence of aspiration ([place] 2021): Poor general health and/or compromised immunity; Frail or deconditioned; Weak cough; Reduced saliva Recommendations/Plan: Swallowing Evaluation Recommendations Swallowing Evaluation Recommendations Recommendations: PO diet PO Diet Recommendation: Full liquid diet; Thin liquids (Level 0) Liquid Administration via: Cup; Straw Medication Administration: Whole meds with puree Supervision: Full supervision/cueing for swallowing strategies; Full assist for feeding Swallowing strategies  : Slow rate; Small bites/sips; Check for pocketing or oral holding Postural changes: Position pt fully upright for meals; Stay upright 30-60 min after meals Oral care recommendations: Oral care BID (2x/day); Staff/trained caregiver to provide oral care Treatment Plan Treatment Plan Treatment recommendations: Therapy as outlined in treatment plan below Follow-up recommendations: Follow physicians's recommendations for discharge plan and follow up therapies Functional status assessment: Patient has had a recent decline in their functional status and demonstrates the ability to make significant improvements in function in a reasonable and predictable amount of time. Treatment frequency: Min 2x/week Treatment duration: 1 week Interventions:  Compensatory techniques; Patient/family education; Trials of upgraded texture/liquids; Diet toleration management by SLP Recommendations Recommendations for follow up therapy are one component of a multi-disciplinary discharge planning process, led by the attending physician.  Recommendations may be updated based on patient status, additional functional criteria and insurance authorization. Assessment: Orofacial Exam: Orofacial Exam Oral Cavity: Oral Hygiene: Xerostomia Oral Cavity - Dentition: Adequate natural dentition Orofacial Anatomy: WFL Anatomy: Anatomy: Prominent cricopharyngeus; Suspected cervical osteophytes Boluses Administered: Boluses Administered Boluses Administered: Thin liquids (Level 0); Mildly thick liquids (Level 2, nectar thick); Puree  Oral Impairment Domain: Oral Impairment Domain Lip Closure: Escape from interlabial space or lateral juncture, no extension beyond vermillion border Tongue control during bolus hold: Not tested Bolus transport/lingual motion: Slow tongue motion Oral residue: Trace residue lining oral structures Location of oral residue : Tongue; Palate Initiation of pharyngeal swallow : Valleculae  Pharyngeal Impairment Domain: Pharyngeal Impairment Domain Soft palate elevation: No bolus between soft palate (SP)/pharyngeal wall (PW) Laryngeal elevation: Complete superior movement of  thyroid cartilage with complete approximation of arytenoids to epiglottic petiole Anterior hyoid excursion: Complete anterior movement Epiglottic movement: Partial inversion Laryngeal vestibule closure: Complete, no air/contrast in laryngeal vestibule Pharyngeal stripping wave : Present - complete Pharyngeal contraction (A/P view only): N/A Pharyngoesophageal segment opening: Partial distention/partial duration, partial obstruction of flow Tongue base retraction: Trace column of contrast or air between tongue base and PPW Pharyngeal residue: Collection of residue within or on pharyngeal structures  Location of pharyngeal residue: Valleculae; Tongue base  Esophageal Impairment Domain: Esophageal Impairment Domain Esophageal clearance upright position: Esophageal retention Pill: Esophageal Impairment Domain Esophageal clearance upright position: Esophageal retention Penetration/Aspiration Scale Score: Penetration/Aspiration Scale Score 1.  Material does not enter airway: Thin liquids (Level 0); Mildly thick liquids (Level 2, nectar thick); Puree Compensatory Strategies: Compensatory Strategies Compensatory strategies: Yes Straw: Effective Effective Straw: Thin liquid (Level 0)   General Information: Caregiver present: No  Diet Prior to this Study: Full liquid diet   Temperature : Normal   Respiratory Status: WFL   Supplemental O2: None (Room air)   History of Recent Intubation: No  Behavior/Cognition: Alert; Cooperative Self-Feeding Abilities: Needs assist with self-feeding Baseline vocal quality/speech: Hypophonia/low volume No data recorded Volitional Swallow: Able to elicit Exam Limitations: No limitations Goal Planning: Prognosis for improved oropharyngeal function: Fair Barriers to Reach Goals: Severity of deficits; Behavior No data recorded Patient/Family Stated Goal: pt states "I'm dying" than says "I don't want to feel this way" Consulted and agree with results and recommendations: Pt unable/family or caregiver not available Pain: Pain Assessment Pain Assessment: No/denies pain End of Session: Start Time:SLP Start Time (ACUTE ONLY): 1110 Stop Time: SLP Stop Time (ACUTE ONLY): 1130 Time Calculation:SLP Time Calculation (min) (ACUTE ONLY): 20 min Charges: SLP Evaluations $ SLP Speech Visit: 1 Visit SLP Evaluations $BSS Swallow: 1 Procedure $MBS Swallow: 1 Procedure SLP visit diagnosis: SLP Visit Diagnosis: Dysphagia, oropharyngeal phase (R13.12) Past Medical History: Past Medical History: Diagnosis Date  Actinic keratoses   Arthritis   back   Breast cancer ([place])   Breast cancer (right)  Chronic diastolic  CHF (congestive heart failure) (HCC)   CKD (chronic kidney disease), stage III (HCC)   Compression fracture of L3 lumbar vertebra   Dysphagia   GERD (gastroesophageal reflux disease)   takes otc Pepcid as needed  Hiatal hernia 11/27/2014  HTN (hypertension)   Hypothyroidism   Insomnia   Osteopenia   PAF (paroxysmal atrial fibrillation) ([place])   a. questionable episode in 2015 in [place]. b. event monitor through December/January 2014/2015 which showed no evidence of atrial fibrillation. c. Dx 12/2018.  PAT (paroxysmal atrial tachycardia)   Pleural effusion  Past Surgical History: Past Surgical History: Procedure Laterality Date  BREAST LUMPECTOMY Right 1995  lumpectomy  COLONOSCOPY    ESOPHAGOGASTRODUODENOSCOPY (EGD) WITH PROPOFOL Left 01/09/2019  Procedure: ESOPHAGOGASTRODUODENOSCOPY (EGD) WITH PROPOFOL;  Surgeon: Carol Ada, MD;  Location: [facility];  Service: Endoscopy;  Laterality: Left;  EYE SURGERY Bilateral   cataract w/ lens implant  KYPHOPLASTY N/A 09/18/2013  Procedure: KYPHOPLASTY;  Surgeon: Sinclair Ship, MD;  Location: [place];  Service: Orthopedics;  Laterality: N/A;  Lumbar 3 kyphoplasty  TONSILLECTOMY   Sonia Baller, MA, CCC-SLP Speech Therapy   DG ESOPHAGUS W SINGLE CM (SOL OR THIN BA)  Result Date: 09/04/2022 CLINICAL DATA:  Coughing after eating and drinking. Evaluate for an esophageal stricture. EXAM: ESOPHOGRAM/BARIUM SWALLOW TECHNIQUE: Single contrast examination was performed using  thin barium. FLUOROSCOPY: Radiation Exposure Index (as provided by the fluoroscopic device): 4.6  mGy Kerma COMPARISON:  None. FINDINGS: A very limited examination was performed in the recumbent LPO position, due to patient condition. Patient was only able to drink very minimal contrast with coughing after each swallow. Poor esophageal motility with tertiary contractions. Suspect a distal esophageal stricture. Study was terminated due to excessive coughing and patient condition. Moderate to large hiatal  hernia. IMPRESSION: 1. Very limited examination was performed due to patient condition, as detailed above. 2. Suspect a distal esophageal stricture. Tablet was not administered due to patient condition. 3. Esophageal dysmotility. 4. Moderate to large hiatal hernia. Electronically Signed   By: Lorin Picket M.D.   On: 09/04/2022 12:01   DG Chest 2 View  Result Date: 09/03/2022 CLINICAL DATA:  Evaluate pneumonia. EXAM: CHEST - 2 VIEW COMPARISON:  09/01/22 FINDINGS: Stable cardiomediastinal contours. Lung volumes are low with mild asymmetric elevation of the right hemidiaphragm. Increase interstitial markings are identified bilaterally concerning for mild interstitial edema. Small left pleural effusion identified. Decreased aeration to the left base may reflect atelectasis or airspace disease. IMPRESSION: 1. Small left pleural effusion and mild interstitial edema. Correlate for any signs/symptoms of CHF. 2. Decreased aeration to the left base may reflect atelectasis or airspace disease. Electronically Signed   By: Kerby Moors M.D.   On: 09/03/2022 17:16     Scheduled Meds:  acetaminophen  1,000 mg Oral Q8H   feeding supplement  237 mL Oral BID BM   guaiFENesin  600 mg Oral BID   heparin  5,000 Units Subcutaneous Q8H   levothyroxine  50 mcg Oral QAC breakfast   metoprolol tartrate  12.5 mg Oral BID   pantoprazole  40 mg Oral Daily   polyethylene glycol  17 g Oral Daily   Continuous Infusions:  sodium chloride 50 mL/hr at 09/04/22 1837   azithromycin 500 mg (09/05/22 0039)   cefTRIAXone (ROCEPHIN)  IV 1 g (09/04/22 2216)     LOS: 3 days   Time spent: 18  Nita Sells, MD [facility] To contact the attending provider between 7A-7P or the covering provider during after hours 7P-7A, please log into the web site [email or web address] and access using universal [place] password for that web site. If you do not have the password, please call the hospital operator.  09/05/2022, 11:03  AM

## 2022-09-06 ENCOUNTER — Inpatient Hospital Stay (HOSPITAL_COMMUNITY): Payer: Medicare Other

## 2022-09-06 ENCOUNTER — Other Ambulatory Visit: Payer: Self-pay

## 2022-09-06 DIAGNOSIS — J189 Pneumonia, unspecified organism: Secondary | ICD-10-CM | POA: Diagnosis not present

## 2022-09-06 DIAGNOSIS — Z515 Encounter for palliative care: Secondary | ICD-10-CM

## 2022-09-06 LAB — RENAL FUNCTION PANEL
Albumin: 2.4 g/dL — ABNORMAL LOW (ref 3.5–5.0)
Anion gap: 4 — ABNORMAL LOW (ref 5–15)
BUN: 18 mg/dL (ref 8–23)
CO2: 23 mmol/L (ref 22–32)
Calcium: 8.6 mg/dL — ABNORMAL LOW (ref 8.9–10.3)
Chloride: 110 mmol/L (ref 98–111)
Creatinine, Ser: 1.26 mg/dL — ABNORMAL HIGH (ref 0.44–1.00)
GFR, Estimated: 39 mL/min — ABNORMAL LOW (ref >60)
Glucose, Bld: 85 mg/dL (ref 70–99)
Phosphorus: 3 mg/dL (ref 2.5–4.6)
Potassium: 4 mmol/L (ref 3.5–5.1)
Sodium: 137 mmol/L (ref 135–145)

## 2022-09-06 MED ORDER — METOPROLOL TARTRATE 25 MG PO TABS: 12.5000 mg | ORAL_TABLET | Freq: Two times a day (BID) | ORAL | 0 refills | Status: DC

## 2022-09-06 MED ORDER — FUROSEMIDE 10 MG/ML IJ SOLN
40.0000 mg | Freq: Once | INTRAMUSCULAR | Status: AC
  Administered 2022-09-06: 40 mg via INTRAVENOUS
  Filled 2022-09-06: qty 4

## 2022-09-06 MED ORDER — CEFDINIR 300 MG PO CAPS: 300.0000 mg | ORAL_CAPSULE | Freq: Two times a day (BID) | ORAL | 0 refills | Status: AC

## 2022-09-06 MED ORDER — FUROSEMIDE 10 MG/ML IJ SOLN
40.0000 mg | Freq: Two times a day (BID) | INTRAMUSCULAR | Status: DC
  Administered 2022-09-06 – 2022-09-07 (×2): 40 mg via INTRAVENOUS
  Filled 2022-09-06 (×2): qty 4

## 2022-09-06 NOTE — Plan of Care (Signed)

## 2022-09-06 NOTE — TOC Progression Note (Signed)
Transition of Care [facility]) - Progression Note    Patient Details  Name: Cassandra Ray MRN: [id number] Date of Birth: [birth year]  Transition of Care [facility]) CM/SW [place], LCSW Phone Number: 09/06/2022, 1:54 PM  Clinical Narrative:    Spoke with pt's spouse to discuss current recommendations for pt. Pt's spouse is agreeable to pt being referred for palliative care and is agreeable for pt to discharge to the skilled nursing at [facility] prior to returning to ALF.  A referral was made to [facility] for outpatient palliative care.  Spoke with Butch Penny at [employer] and confirmed plan. She reports having SNF bed available for pt to transfer to once medically ready.     Expected Discharge Plan: Assisted Living Barriers to Discharge: Continued Medical Work up  Expected Discharge Plan and Services In-house Referral: NA Discharge Planning Services: NA Post Acute Care Choice: NA Living arrangements for the past 2 months: [place] Expected Discharge Date: 09/06/22               DME Arranged: N/A DME Agency: NA                   Social Determinants of Health (SDOH) Interventions SDOH Screenings   Food Insecurity: No Food Insecurity (09/02/2022)  Transportation Needs: No Transportation Needs (09/02/2022)  Utilities: Not At Risk (09/02/2022)  Tobacco Use: Medium Risk (09/01/2022)    Readmission Risk Interventions    09/05/2022    2:23 PM  Readmission Risk Prevention Plan  Transportation Screening Complete  PCP or Specialist Appt within 5-7 Days Complete  Home Care Screening Complete  Medication Review (RN CM) Complete

## 2022-09-06 NOTE — [place] FL2 (Signed)
[place] MEDICAID FL2 LEVEL OF CARE FORM     IDENTIFICATION  Patient Name: Cassandra Ray Birthdate: [birth year] Sex: female Admission Date (Current Location): 09/01/2022  [facility] and [place] Number:  Herbalist and Address:  [facility],  [place] [place], [place]      Provider Number: [id number]  Attending Physician Name and Address:  Darliss Cheney, MD  Relative Name and Phone Number:  Purvis Sheffield [phone]    Current Level of Care: Hospital Recommended Level of Care: [place] Prior Approval Number:    Date Approved/Denied:   PASRR Number:    Discharge Plan: SNF    Current Diagnoses: Patient Active Problem List   Diagnosis Date Noted   Left lower lobe pneumonia 09/02/2022   Transaminitis 09/02/2022   CKD (chronic kidney disease), stage IV 09/02/2022   CAP (community acquired pneumonia) 09/02/2022   Chronic kidney disease, stage III (moderate) 08/09/2021   Upper GI bleed 01/08/2019   Atrial fibrillation with RVR    Paroxysmal atrial fibrillation 12/12/2018   Acute kidney injury superimposed on chronic kidney disease 12/12/2018   Leukocytosis 12/12/2018   Gastroesophageal reflux disease    Weakness    Tachycardia 05/24/2018   Acute respiratory failure with hypoxia 05/23/2018   Bilateral impacted cerumen 11/22/2016   Presbycusis of both ears 11/22/2016   Pleural effusion, right 02/08/2016   Acute on chronic diastolic CHF (congestive heart failure) 02/08/2016   Lower extremity edema 01/31/2016   Chronic heart failure with preserved ejection fraction (HFpEF) 02/03/2015   Hyponatremia 04/25/2014   Shortness of breath    Compression fracture 09/18/2013   Knee pain 08/11/2013   Back pain 05/09/2013   Essential hypertension    Hypothyroidism     Orientation RESPIRATION BLADDER Height & Weight     Self, Place  Normal Incontinent Weight: 142 lb (64.4 kg) Height:  5' 3.8" (162.1 cm)  BEHAVIORAL  SYMPTOMS/MOOD NEUROLOGICAL BOWEL NUTRITION STATUS      Continent Diet (See discharge summary)  AMBULATORY STATUS COMMUNICATION OF NEEDS Skin   Limited Assist Verbally Normal                       Personal Care Assistance Level of Assistance  Bathing, Feeding, Dressing Bathing Assistance: Maximum assistance Feeding assistance: Limited assistance Dressing Assistance: Maximum assistance     Functional Limitations Info  Sight, Hearing, Speech Sight Info: Adequate Hearing Info: Impaired Speech Info: Adequate    SPECIAL CARE FACTORS FREQUENCY  PT (By licensed PT), OT (By licensed OT)     PT Frequency: 5x/wk OT Frequency: 5x/wk            Contractures Contractures Info: Not present    Additional Factors Info  Code Status, Allergies Code Status Info: DNR Allergies Info: Cardizem (Diltiazem), Eliquis (Apixaban), Tape           Current Medications (09/06/2022):  This is the current hospital active medication list Current Facility-Administered Medications  Medication Dose Route Frequency Provider Last Rate Last Admin   acetaminophen (TYLENOL) tablet 1,000 mg  1,000 mg Oral Q8H Samtani, Jai-Gurmukh, MD   1,000 mg at 09/06/22 0547   albuterol (PROVENTIL) (2.5 MG/3ML) 0.083% nebulizer solution 2.5 mg  2.5 mg Nebulization Q6H PRN Shela Leff, MD   2.5 mg at 09/05/22 1616   cefdinir (OMNICEF) capsule 300 mg  300 mg Oral Q12H Nita Sells, MD   300 mg at 09/06/22 0920   feeding supplement (ENSURE ENLIVE /  ENSURE PLUS) liquid 237 mL  237 mL Oral BID BM Samtani, Jai-Gurmukh, MD   237 mL at 09/06/22 1017   furosemide (LASIX) injection 40 mg  40 mg Intravenous Once Darliss Cheney, MD       furosemide (LASIX) injection 40 mg  40 mg Intravenous BID Pahwani, Einar Grad, MD       guaiFENesin (MUCINEX) 12 hr tablet 600 mg  600 mg Oral BID Shela Leff, MD   600 mg at 09/06/22 0920   heparin injection 5,000 Units  5,000 Units Subcutaneous Q8H Shela Leff, MD   5,000  Units at 09/03/22 1508   levothyroxine (SYNTHROID) tablet 50 mcg  50 mcg Oral QAC breakfast Nita Sells, MD   50 mcg at 09/06/22 0547   lip balm (CARMEX) ointment   Topical PRN Nita Sells, MD       magic mouthwash  2 mL Oral TID Nita Sells, MD   2 mL at 09/06/22 0920   melatonin tablet 5 mg  5 mg Oral QHS PRN Nita Sells, MD   5 mg at 09/05/22 2135   methocarbamol (ROBAXIN) tablet 250 mg  250 mg Oral BID PRN Nita Sells, MD       metoprolol tartrate (LOPRESSOR) tablet 12.5 mg  12.5 mg Oral BID Nita Sells, MD   12.5 mg at 09/06/22 0920   nitroGLYCERIN (NITROSTAT) SL tablet 0.4 mg  0.4 mg Sublingual Q5 min PRN Nita Sells, MD       ondansetron (ZOFRAN) tablet 4 mg  4 mg Oral Q6H PRN Nita Sells, MD   4 mg at 09/03/22 0947   pantoprazole (PROTONIX) EC tablet 40 mg  40 mg Oral Daily Nita Sells, MD   40 mg at 09/06/22 0920   polyethylene glycol (MIRALAX / GLYCOLAX) packet 17 g  17 g Oral Daily Nita Sells, MD   17 g at 09/05/22 1516     Discharge Medications: Please see discharge summary for a list of discharge medications.  Relevant Imaging Results:  Relevant Lab Results:   Additional Information SSN: [id number]  Vassie Moselle, LCSW

## 2022-09-06 NOTE — Evaluation (Signed)
`Occupational Therapy Evaluation Patient Details Name: Cassandra Ray MRN: [id number] DOB: November 22, 1922 Today's Date: 09/06/2022   History of Present Illness Pt is a 89 y/o female presenting from [place] for SOB in setting of recent aspiration PNA diagnosis. PMH: a fib, hx of GI bleed, GERD, CKD, HFpEF, HTN, hypothyroidism, breast CA   Clinical Impression   PTA, pt typically resides at [facility], has consistent aide support for ADLs and able to mobilize with a walker. Pt presents now w/ significant decline and noted guarded prognosis. Pt able to complete bed mobility with Min-Mod A, brief standing with RW at [place] A x 2 (pt's personal aide present to assist). Pt requires Min A for UB ADL and Max A for LB ADLs at this time. Will continue to follow acutely to progress functional abilities within pt's tolerance. Recommend DC to most appropriate environment where 24/7 assist can be provided pending ongoing [place] discussions.      Recommendations for follow up therapy are one component of a multi-disciplinary discharge planning process, led by the attending physician.  Recommendations may be updated based on patient status, additional functional criteria and insurance authorization.   Assistance Recommended at Discharge Frequent or constant Supervision/Assistance  Patient can return home with the following A lot of help with walking and/or transfers;A lot of help with bathing/dressing/bathroom    Functional Status Assessment  Patient has had a recent decline in their functional status and demonstrates the ability to make significant improvements in function in a reasonable and predictable amount of time.  Equipment Recommendations  Wheelchair cushion (measurements OT);Wheelchair (measurements OT)    Recommendations for Other Services       Precautions / Restrictions Precautions Precautions: Fall Precaution Comments: monitor O2 Restrictions Weight Bearing Restrictions: No       Mobility Bed Mobility Overal bed mobility: Needs Assistance Bed Mobility: Supine to Sit, Sit to Supine     Supine to sit: Min assist, HOB elevated Sit to supine: Mod assist   General bed mobility comments: Min A for handheld assist to lift trunk, increased time and effort with Mod A to lift BLE back to bed    Transfers Overall transfer level: Needs assistance Equipment used: Rolling walker (2 wheels) Transfers: Sit to/from Stand Sit to Stand: Min assist, +2 physical assistance, +2 safety/equipment           General transfer comment: Pt's home aide present and assisting pt to stand with RW. Planned to transfer to chair though pt quickly fatigued and politely declined      Balance Overall balance assessment: Needs assistance Sitting-balance support: No upper extremity supported, Feet supported Sitting balance-Leahy Scale: Fair     Standing balance support: Bilateral upper extremity supported, During functional activity Standing balance-Leahy Scale: Poor                             ADL either performed or assessed with clinical judgement   ADL Overall ADL's : Needs assistance/impaired Eating/Feeding: Set up;Supervision/ safety;Bed level   Grooming: Set up;Sitting   Upper Body Bathing: Minimal assistance;Sitting   Lower Body Bathing: Maximal assistance;Sit to/from stand   Upper Body Dressing : Minimal assistance;Sitting   Lower Body Dressing: Maximal assistance;Sit to/from stand       Toileting- Water quality scientist and Hygiene: Maximal assistance;Sit to/from stand;Sitting/lateral lean         General ADL Comments: Pt limited by medical complexities w/ impaired respiratory status and swallowing abilities impacting nutrition.  Vision Baseline Vision/History: 1 Wears glasses Ability to See in Adequate Light: 0 Adequate Patient Visual Report: No change from baseline Vision Assessment?: No apparent visual deficits     Perception      Praxis      Pertinent Vitals/Pain Pain Assessment Pain Assessment: No/denies pain     Hand Dominance Right   Extremity/Trunk Assessment Upper Extremity Assessment Upper Extremity Assessment: Generalized weakness   Lower Extremity Assessment Lower Extremity Assessment: Defer to PT evaluation   Cervical / Trunk Assessment Cervical / Trunk Assessment: Kyphotic   Communication Communication Communication: HOH;Other (comment) (hearing aides)   Cognition Arousal/Alertness: Awake/alert Behavior During Therapy: WFL for tasks assessed/performed Overall Cognitive Status: Within Functional Limits for tasks assessed                                 General Comments: [facility] basic tasks. not formally assessed     General Comments       Exercises     Shoulder Instructions      Home Living Family/patient expects to be discharged to:: Assisted living                             Home Equipment: Rolling Walker (2 wheels)   Additional Comments: From [facility] with husband      Prior Functioning/Environment Prior Level of Function : Needs assist             Mobility Comments: uses RW for mobility typically ADLs Comments: has aide to assist with ADLs. One aide present during session, reports she assists 3x/wk and pt has other assistance on the days she is not there        OT Problem List: Decreased strength;Decreased activity tolerance;Impaired balance (sitting and/or standing);Cardiopulmonary status limiting activity;Decreased knowledge of use of DME or AE      OT Treatment/Interventions: Self-care/ADL training;Therapeutic exercise;Energy conservation;DME and/or AE instruction;Therapeutic activities;Balance training;Patient/family education    OT Goals(Current goals can be found in the care plan section) Acute Rehab OT Goals Patient Stated Goal: feel better OT Goal Formulation: With patient Time For Goal Achievement: 09/20/22 Potential to  Achieve Goals: Fair  OT Frequency: Min 2X/week    Co-evaluation              AM-PAC OT "6 Clicks" Daily Activity     Outcome Measure Help from another person eating meals?: A Little Help from another person taking care of personal grooming?: A Little Help from another person toileting, which includes using toliet, bedpan, or urinal?: A Lot Help from another person bathing (including washing, rinsing, drying)?: A Lot Help from another person to put on and taking off regular upper body clothing?: A Little Help from another person to put on and taking off regular lower body clothing?: A Lot 6 Click Score: 15   End of Session Equipment Utilized During Treatment: Rolling walker (2 wheels);Oxygen Nurse Communication: Mobility status;Other (comment) (Pt's aide w/ questions regarding care and diet)  Activity Tolerance: Patient limited by fatigue Patient left: in bed;with call bell/phone within reach;with bed alarm set;with family/visitor present  OT Visit Diagnosis: Unsteadiness on feet (R26.81);Other abnormalities of gait and mobility (R26.89);Muscle weakness (generalized) (M62.81)                Time: [id number] OT Time Calculation (min): 27 min Charges:  OT General Charges $OT Visit: 1 Visit OT Evaluation $OT Eval Moderate  Complexity: 1 Mod OT Treatments $Self Care/Home Management : 8-22 mins  Malachy Chamber, OTR/L Acute Rehab Services Office: [phone]   Layla Maw 09/06/2022, 9:18 AM

## 2022-09-06 NOTE — Evaluation (Signed)
Physical Therapy Evaluation Patient Details Name: Cassandra Ray MRN: [id number] DOB: 1922/11/14 Today's Date: 09/06/2022  History of Present Illness  Pt is a 89 y/o F admitted on 09/01/22 after presenting with failure of outpatient tx for PNA. PMH: R sided breast CA, permanent a-fib, hypothyroidism, CKD 3B, HFpEF, uncontrolled HTN  Clinical Impression  Pt seen for PT evaluation with pt agreeable with encouragement/education. Pt's personal aide present for session. Prior to admission pt was ambulatory with RW but had an aide 3 hours/day to assist with bathing but she also reports she would retreive pt's meals for her. On this date, pt requires mod assist for bed mobility & mod assist +2 for STS & step pivot bed>recliner. PT educates & encourages pt to sit in recliner for improved lung function & upright/OOB tolerates. Briefly reviewed use of acapella flutter valve. Recommend ongoing PT services to address strength, balance, activity tolerance to decrease burden of care.       Recommendations for follow up therapy are one component of a multi-disciplinary discharge planning process, led by the attending physician.  Recommendations may be updated based on patient status, additional functional criteria and insurance authorization.  Follow Up Recommendations Can patient physically be transported by private vehicle: No     Assistance Recommended at Discharge Frequent or constant Supervision/Assistance  Patient can return home with the following  A lot of help with walking and/or transfers;A lot of help with bathing/dressing/bathroom;Assist for transportation;Help with stairs or ramp for entrance;Direct supervision/assist for financial management;Assistance with cooking/housework;Direct supervision/assist for medications management    Equipment Recommendations None recommended by PT (TBD in next venue)  Recommendations for Other Services       Functional Status Assessment Patient has had a recent  decline in their functional status and demonstrates the ability to make significant improvements in function in a reasonable and predictable amount of time.     Precautions / Restrictions Precautions Precautions: Fall Precaution Comments: monitor O2 Restrictions Weight Bearing Restrictions: No      Mobility  Bed Mobility Overal bed mobility: Needs Assistance Bed Mobility: Supine to Sit     Supine to sit: HOB elevated, Mod assist     General bed mobility comments: cuing for sequencing    Transfers Overall transfer level: Needs assistance Equipment used: Rolling walker (2 wheels) Transfers: Sit to/from Stand, Bed to chair/wheelchair/BSC Sit to Stand: Mod assist, +2 physical assistance   Step pivot transfers: Mod assist, +2 physical assistance       General transfer comment: STS from EOB with assistance to power up    Ambulation/Gait                  Stairs            Wheelchair Mobility    Modified Rankin (Stroke Patients Only)       Balance Overall balance assessment: Needs assistance Sitting-balance support: No upper extremity supported, Feet supported Sitting balance-Leahy Scale: Fair     Standing balance support: Bilateral upper extremity supported, During functional activity, Reliant on assistive device for balance Standing balance-Leahy Scale: Poor                               Pertinent Vitals/Pain Pain Assessment Pain Assessment: Faces Faces Pain Scale: Hurts a little bit Pain Location: LUE Pain Descriptors / Indicators: Discomfort Pain Intervention(s): Repositioned, Monitored during session    Home Living Family/patient expects to be discharged to:: Assisted living  Home Equipment: Conservation officer, nature (2 wheels);Wheelchair - manual Additional Comments: From [facility] with husband, has an aide 3 hours/day that assists with bathing, dressing, getting meals    Prior Function Prior Level of  Function : Needs assist             Mobility Comments: Ambulatory with RW ADLs Comments: has aide to assist with ADLs. One aide present during session, reports she assists 3x/wk and pt has other assistance on the days she is not there     Hand Dominance   Dominant Hand: Right    Extremity/Trunk Assessment   Upper Extremity Assessment Upper Extremity Assessment: Generalized weakness    Lower Extremity Assessment Lower Extremity Assessment: Generalized weakness    Cervical / Trunk Assessment Cervical / Trunk Assessment: Kyphotic  Communication   Communication: HOH;Other (comment) (hearing aides)  Cognition Arousal/Alertness: Awake/alert Behavior During Therapy: Flat affect Overall Cognitive Status: Within Functional Limits for tasks assessed                                 General Comments: Requires encouragement for participation, cognition WFL for tasks completed during session, not formally assessed.        General Comments General comments (skin integrity, edema, etc.): Pt on 2L/min via nasal cannula, SPO2 >/= 90%. PT educated pt & aide on use of acapella flutter valve but pt would benefit from further education & practice using device.    Exercises     Assessment/Plan    PT Assessment Patient needs continued PT services  PT Problem List Decreased strength;Cardiopulmonary status limiting activity;Decreased activity tolerance;Decreased knowledge of use of DME;Decreased safety awareness;Decreased balance;Decreased mobility;Decreased knowledge of precautions       PT Treatment Interventions DME instruction;Therapeutic exercise;Balance training;Gait training;Neuromuscular re-education;Functional mobility training;Therapeutic activities;Patient/family education    PT Goals (Current goals can be found in the Care Plan section)  Acute Rehab PT Goals Patient Stated Goal: none stated PT Goal Formulation: With patient Time For Goal Achievement:  09/20/22 Potential to Achieve Goals: Fair    Frequency Min 2X/week     Co-evaluation               AM-PAC PT "6 Clicks" Mobility  Outcome Measure Help needed turning from your back to your side while in a flat bed without using bedrails?: A Little Help needed moving from lying on your back to sitting on the side of a flat bed without using bedrails?: A Lot Help needed moving to and from a bed to a chair (including a wheelchair)?: A Lot Help needed standing up from a chair using your arms (e.g., wheelchair or bedside chair)?: A Lot Help needed to walk in hospital room?: Total Help needed climbing 3-5 steps with a railing? : Total 6 Click Score: 11    End of Session Equipment Utilized During Treatment: Oxygen Activity Tolerance: Patient limited by fatigue Patient left: in chair;with call bell/phone within reach;with family/visitor present (personal aide present) Nurse Communication: Mobility status PT Visit Diagnosis: Muscle weakness (generalized) (M62.81);Difficulty in walking, not elsewhere classified (R26.2);Other abnormalities of gait and mobility (R26.89);Unsteadiness on feet (R26.81)    Time: [id number] PT Time Calculation (min) (ACUTE ONLY): 12 min   Charges:   PT Evaluation $PT Eval Moderate Complexity: [employer], PT, DPT 09/06/22, 10:07 AM   Waunita Schooner 09/06/2022, 10:05 AM

## 2022-09-06 NOTE — Progress Notes (Signed)
Speech Language Pathology Treatment: Dysphagia  Patient Details Name: Cassandra Ray MRN: [id number] DOB: [birth year] Today's Date: 09/06/2022 Time: 1210-1240 SLP Time Calculation (min) (ACUTE ONLY): 30 min  Assessment / Plan / Recommendation Clinical Impression  SLP follow-up today to help manage dysphagia and determine if patient is willing and ready to consider dietary advancement to maximize nutrition using compensation strategies to mitigate aspiration. Patient just returned from x-ray and chest x-ray indicates increased interstitial markings. Caregiver [place] present and reports patient consumed breakfast today without coughing. Facilitated session by initially feeding patient with various consistencies for determination of clinical tolerance. Patient consumed chocolate pudding, Ensure chocolate wafer via straw, for graham cracker dipped into pudding tomorrow, single bite of [place] ice, bites chicken noodle soup, and ice via teaspoon and straw. Patient noted to cough x 3 throughout entire session, expectorating secretions that appear to be mixed with chocolate. She reported being more content with consideration of solid food advancement. Informed again patient and spouse to patient's chronic dysphagia and aspiration with goal to maximize comfort and intake. Compensation for accommodating oropharyngeal and esophageal dysphagia reviewed and provided in writing. SLP left patient in room for herself. Continue to recommend Ensure with meals and THIN liquids between meals to mitigate her aspiration. Given plan of care is to continue treatment, recommend consideration for short-term SLP follow-up to help maximize patient's pulmonary clearance, swallow function and tolerance of p.o. intake.     HPI HPI: Patient is a 89 year old female admitted to [facility] hospital with left lower lobe pneumonia, hyponatremia.  Patient has history of GERD, breast cancer, dysphagia, hiatal hernia.  She was treated for  pneumonia several days prior to admission but did not improve.  Prior esophagram in 2016 showed hiatal hernia as well as traction diverticulum below the carina, normal motility, flash penetration of 50 liquid.  Her GI doctor at that time did not advise endoscopy.  Swallow evaluation ordered by Dr. Verlon Au.  CXR 09/03/2022 shows  Small left pleural effusion and mild interstitial edema.  Correlate for any signs/symptoms of CHF.  2. Decreased aeration to the left base may reflect atelectasis or  airspace disease.  Patient underwent MBS study and esophagram yesterday, her oropharyngeal dysphagia is worse compared to 2019 and Dr. Michail Sermon documents suspicion for esophageal dysmotility as source of her symptoms.  It was not advised for her to have endoscopy due to her age and multiple risk factors.  Patient has been maintained on a full liquid diet.  Chest x-ray from today shows "Increased interstitial markings in both lungs suggest possible  interstitial pulmonary edema with no significant interval change.  Small bilateral pleural effusions, more so on the left side.  Possibility of atelectasis/pneumonia in the lower lung fields is not excluded."  Follow-up to help manage her dysphagia included.      SLP Plan  Continue with current plan of care      Recommendations for follow up therapy are one component of a multi-disciplinary discharge planning process, led by the attending physician.  Recommendations may be updated based on patient status, additional functional criteria and insurance authorization.    Recommendations  Diet recommendations: Dysphagia 3 (mechanical soft);Nectar-thick liquid;Thin liquid (ENSURE with MEALS) Liquids provided via: Cup;Teaspoon;Straw Medication Administration: Whole meds with puree Supervision: Patient able to self feed Compensations: Slow rate;Small sips/bites Postural Changes and/or Swallow Maneuvers: Seated upright 90 degrees;Upright 30-60 min after meal                   Oral care  BID     Dysphagia, oropharyngeal phase (R13.12)     Continue with current plan of care   [name], MS [place] Office [phone]   Macario Golds  09/06/2022, 12:50 PM

## 2022-09-06 NOTE — Progress Notes (Signed)
[facility] [address] [employer] [facility]) Hospital Liaison Note  Notified by Normajean Baxter of patient/family request for [facility] Palliative services at [facility] after discharge.   [place] liaison will follow patient for discharge disposition.   Please call with any Hospice/Palliative related questions or concerns.   Thank you for the opportunity to participate in this patient's care  Jhonnie Garner RN, [facility] [phone]

## 2022-09-06 NOTE — Progress Notes (Signed)
PMT no charge note.   Discussed with several members of the inter disciplinary team today, including TRH MD, TOC, SLP and RN colleagues. Recommend outpatient palliative care and aspiration precautions. No new PMT specific recommendations at this time.  No charge Loistine Chance MD [place] palliative.

## 2022-09-06 NOTE — Progress Notes (Signed)
PROGRESS NOTE    Cassandra Ray  [id number] DOB: [birth year] DOA: 09/01/2022 PCP: Lawerance Cruel, MD   Brief Narrative:  89 year old white female [facility] dwelling--semiindependent at baseline uses walker and wheelchair husband lives with her at age 40 and helps her mobilize Prior breast cancer, right-sided Permanent A-fib previously on Eliquis-stopped because of [name] lesions on EGD 01/09/2019 and dark stool Hemoccult positive-follows with Dr. [name] of cardiology Hypothyroidism on Synthroid CKD 3B HFpEF EF 60-65% 12/2018 Uncontrolled hypertension previously   Presented from [facility] with outpatient failure pneumonia and in setting of having been seen 3/17 at [facility] primary care and starting on Augmentin she appears to have finished her last dose of Augmentin on 3/29-day of arrival at the [facility] and had been placed on supplemental oxygen in the outpatient setting-   Seen by admitting physician started on antibiotics fluids 4/1 esophagram very limited examination due to patient condition distal esophageal stricture?-Was coughing throughout exam so tablet was not administered tertiary contractions of esophagus noted moderate to large hiatal hernia noted   3/31 2 view CXR = bilateral aspiration 3/30 discussed with [facility] cardiology who recommends to completely discontinue amnio 4/1 GI consult do not recommend endoscopy-high risk for complication with instrumentation  Assessment & Plan:   Principal Problem:   Left lower lobe pneumonia Active Problems:   Essential hypertension   Hyponatremia   Chronic heart failure with preserved ejection fraction (HFpEF)   Paroxysmal atrial fibrillation   Transaminitis   CKD (chronic kidney disease), stage IV   CAP (community acquired pneumonia)   Aspiration pneumonia on admission-with history of esophageal stricture and oropharyngeal dysphagia. Esophagram could not be completed because of patient coughing during  procedure and tablet was not given GI does not recommend intervention --> Was transitioned to oral cefpodoxime 300 twice daily from IV antibiotics on 09/05/2022.  Repeat x-ray today did not show any infiltrates but shows pulmonary edema.  She is reassessed by SLP today and has been advanced to dysphagia 3 diet.  However patient does have audible gurgling sounds/possible crackles which, per husband was at the bedside, his new and he does not feel comfortable with her being discharged today.  I have given her 1 dose of IV Lasix 40 mg today.  The gurgling sound is likely due to her inability to control secretions well and likely oropharyngeal.  Could also be pulmonary as well.  I will repeat 1 dose of IV Lasix later today.  Reassess tomorrow morning.   Hyponatremia: Resolved.   Mild AKI superimposed on CKD 3B: Baseline creatinine appears to be around 1.3, presented with 1.61 meeting criteria for AKI.  I do not see any evidence of metabolic acidosis as mentioned in previous notes.  Creatinine back to baseline now.  Permanent A-fib CHADVASC >4 has bled score >4-is not on Eliquis secondary to GI bleed discontinued amnio after discussion with cardiology given age and risk factors Recommends to reconsult cardiology if further issues with rate control etc. At this time continuing Lopressor 12.5 twice daily only, previously she was on Lopressor 25 mg p.o. twice daily.   Transaminitis Etiology possibly secondary to amiodarone which was cut back from LFTs slowly trending down All labs repeat a.m.   Hyperthyroid state in the setting of prior hypothyroidism Would keep thyroxine current dose 50 given hypermetabolic state of infection Needs TSH in 2 to 3 weeks outpatient   Prior breast cancer right-sided Restricted right arm outpatient surveillance per PCP  DVT prophylaxis: heparin injection 5,000 Units  Start: 09/02/22 0600   Code Status: DNR  Family Communication: Husband present at bedside.  Plan of care  discussed with patient in length and he/she verbalized understanding and agreed with it.  Status is: Inpatient Remains inpatient appropriate because: Appears as stable as she can be but husband does not feel comfortable with discharge today.  Will reassess tomorrow morning.   Estimated body mass index is 24.53 kg/m as calculated from the following:   Height as of this encounter: 5' 3.8" (1.621 m).   Weight as of this encounter: 64.4 kg.    Nutritional Assessment: Body mass index is 24.53 kg/m.Marland Kitchen Seen by dietician.  I agree with the assessment and plan as outlined below: Nutrition Status:        . Skin Assessment: I have examined the patient's skin and I agree with the wound assessment as performed by the wound care RN as outlined below:    Consultants:  Palliative care and GI  Procedures:  None  Antimicrobials:  Anti-infectives (From admission, onward)    Start     Dose/Rate Route Frequency Ordered Stop   09/06/22 0000  cefdinir (OMNICEF) 300 MG capsule        300 mg Oral Every 12 hours 09/06/22 1240 09/09/22 2359   09/05/22 2200  cefdinir (OMNICEF) capsule 300 mg        300 mg Oral Every 12 hours 09/05/22 1610     09/03/22 0000  azithromycin (ZITHROMAX) 500 mg in sodium chloride 0.9 % 250 mL IVPB  Status:  Discontinued        500 mg 250 mL/hr over 60 Minutes Intravenous Every 24 hours 09/02/22 0131 09/05/22 1610   09/02/22 2100  cefTRIAXone (ROCEPHIN) 1 g in sodium chloride 0.9 % 100 mL IVPB  Status:  Discontinued        1 g 200 mL/hr over 30 Minutes Intravenous Every 24 hours 09/02/22 0131 09/05/22 1610   09/02/22 0000  azithromycin (ZITHROMAX) 500 mg in sodium chloride 0.9 % 250 mL IVPB        500 mg 250 mL/hr over 60 Minutes Intravenous  Once 09/01/22 2350 09/02/22 0343   09/01/22 2200  vancomycin (VANCOREADY) IVPB 1250 mg/250 mL        1,250 mg 166.7 mL/hr over 90 Minutes Intravenous  Once 09/01/22 2116 09/02/22 0052   09/01/22 2130  ceFEPIme (MAXIPIME) 2 g in  sodium chloride 0.9 % 100 mL IVPB        2 g 200 mL/hr over 30 Minutes Intravenous  Once 09/01/22 2116 09/01/22 2255         Subjective: Patient seen and examined early this morning.  At that time, her caregiver was at the bedside.  Patient verbalized that she was feeling well and denied having any shortness of breath.  Patient was seen again at bedside when husband requested in the afternoon.  Patient at this time once again was denying any shortness of breath but apparently was appearing to be slightly tachypneic and worsened audible crackles/gurgling sound.  Objective: Vitals:   09/05/22 1938 09/05/22 2134 09/06/22 0447 09/06/22 0920  BP: (!) 124/54 (!) 149/69 (!) 172/76 (!) 170/84  Pulse: 66 70 66   Resp: 18  18   Temp: 97.9 F (36.6 C)  97.8 F (36.6 C)   TempSrc: Oral  Oral   SpO2: 91%  98%   Weight:      Height:        Intake/Output Summary (Last 24 hours) at 09/06/2022 1304  Last data filed at 09/06/2022 1045 Gross per 24 hour  Intake 506 ml  Output 1150 ml  Net -644 ml   Filed Weights   09/01/22 1925  Weight: 64.4 kg    Examination:   General exam: Appears calm and comfortable  Respiratory system: Rhonchi bilaterally.  Respiratory effort normal. Cardiovascular system: S1 & S2 heard, RRR. No JVD, murmurs, rubs, gallops or clicks. No pedal edema. Gastrointestinal system: Abdomen is nondistended, soft and nontender. No organomegaly or masses felt. Normal bowel sounds heard. Central nervous system: Alert and oriented. No focal neurological deficits. Extremities: Symmetric 5 x 5 power. Skin: No rashes, lesions or ulcers Psychiatry: Judgement and insight appear normal. Mood & affect appropriate.    Data Reviewed: I have personally reviewed following labs and imaging studies  CBC: Recent Labs  Lab 09/01/22 2037 09/02/22 0713 09/03/22 0642  WBC 7.5 8.7 7.8  NEUTROABS 5.1  --  5.2  HGB 12.6 12.1 11.8*  HCT 38.4 36.1 35.0*  MCV 96.2 95.0 95.6  PLT 173 194  PLATELET CLUMPS NOTED ON SMEAR, UNABLE TO ESTIMATE   Basic Metabolic Panel: Recent Labs  Lab 09/02/22 0608 09/03/22 0642 09/04/22 0750 09/05/22 0556 09/06/22 0540  NA 133* 130* 134* 137 137  K 4.0 3.8 3.8 4.1 4.0  CL 101 101 106 110 110  CO2 24 23 22 22 23   GLUCOSE 85 89 75 77 85  BUN 25* 18 14 15 18   CREATININE 1.40* 1.31* 1.15* 1.16* 1.26*  CALCIUM 8.1* 8.3* 8.4* 7.9* 8.6*  PHOS  --   --  2.5 3.4 3.0   GFR: Estimated Creatinine Clearance: 21.8 mL/min (A) (by C-G formula based on SCr of 1.26 mg/dL (H)). Liver Function Tests: Recent Labs  Lab 09/01/22 2037 09/02/22 1137 09/03/22 0642 09/04/22 0750 09/05/22 0556 09/06/22 0540  AST 314* 329* 239* 208*  --   --   ALT 285* 286* 240* 220*  --   --   ALKPHOS 165* 166* 151* 161*  --   --   BILITOT 0.8 0.8 0.9 1.0  --   --   PROT 6.1* 6.4* 5.5* 6.1*  --   --   ALBUMIN 2.8* 2.9* 2.3* 2.5*  2.5* 2.1* 2.4*   No results for input(s): "LIPASE", "AMYLASE" in the last 168 hours. No results for input(s): "AMMONIA" in the last 168 hours. Coagulation Profile: Recent Labs  Lab 09/02/22 0939  INR 1.2   Cardiac Enzymes: No results for input(s): "CKTOTAL", "CKMB", "CKMBINDEX", "TROPONINI" in the last 168 hours. BNP (last 3 results) No results for input(s): "PROBNP" in the last 8760 hours. HbA1C: No results for input(s): "HGBA1C" in the last 72 hours. CBG: No results for input(s): "GLUCAP" in the last 168 hours. Lipid Profile: No results for input(s): "CHOL", "HDL", "LDLCALC", "TRIG", "CHOLHDL", "LDLDIRECT" in the last 72 hours. Thyroid Function Tests: No results for input(s): "TSH", "T4TOTAL", "FREET4", "T3FREE", "THYROIDAB" in the last 72 hours. Anemia Panel: No results for input(s): "VITAMINB12", "FOLATE", "FERRITIN", "TIBC", "IRON", "RETICCTPCT" in the last 72 hours. Sepsis Labs: Recent Labs  Lab 09/01/22 2149 09/01/22 2340  LATICACIDVEN 2.1* 1.2    Recent Results (from the past 240 hour(s))  Resp panel by RT-PCR  (RSV, Flu A&B, Covid) Anterior Nasal Swab     Status: None   Collection Time: 09/01/22  8:48 PM   Specimen: Anterior Nasal Swab  Result Value Ref Range Status   SARS Coronavirus 2 by RT PCR NEGATIVE NEGATIVE Final    Comment: (NOTE) SARS-CoV-2 target  nucleic acids are NOT DETECTED.  The SARS-CoV-2 RNA is generally detectable in upper respiratory specimens during the acute phase of infection. The lowest concentration of SARS-CoV-2 viral copies this assay can detect is 138 copies/mL. A negative result does not preclude SARS-Cov-2 infection and should not be used as the sole basis for treatment or other patient management decisions. A negative result may occur with  improper specimen collection/handling, submission of specimen other than nasopharyngeal swab, presence of viral mutation(s) within the areas targeted by this assay, and inadequate number of viral copies(<138 copies/mL). A negative result must be combined with clinical observations, patient history, and epidemiological information. The expected result is Negative.  Fact Sheet for Patients:  EntrepreneurPulse.com.au  Fact Sheet for Healthcare Providers:  IncredibleEmployment.be  This test is no t yet approved or cleared by the [place] FDA and  has been authorized for detection and/or diagnosis of SARS-CoV-2 by FDA under an Emergency Use Authorization (EUA). This EUA will remain  in effect (meaning this test can be used) for the duration of the COVID-19 declaration under Section 564(b)(1) of the Act, 21 U.S.C.section 360bbb-3(b)(1), unless the authorization is terminated  or revoked sooner.       Influenza A by PCR NEGATIVE NEGATIVE Final   Influenza B by PCR NEGATIVE NEGATIVE Final    Comment: (NOTE) The Xpert Xpress SARS-CoV-2/FLU/RSV plus assay is intended as an aid in the diagnosis of influenza from Nasopharyngeal swab specimens and should not be used as a sole basis for  treatment. Nasal washings and aspirates are unacceptable for Xpert Xpress SARS-CoV-2/FLU/RSV testing.  Fact Sheet for Patients: EntrepreneurPulse.com.au  Fact Sheet for Healthcare Providers: IncredibleEmployment.be  This test is not yet approved or cleared by the [place] FDA and has been authorized for detection and/or diagnosis of SARS-CoV-2 by FDA under an Emergency Use Authorization (EUA). This EUA will remain in effect (meaning this test can be used) for the duration of the COVID-19 declaration under Section 564(b)(1) of the Act, 21 U.S.C. section 360bbb-3(b)(1), unless the authorization is terminated or revoked.     Resp Syncytial Virus by PCR NEGATIVE NEGATIVE Final    Comment: (NOTE) Fact Sheet for Patients: EntrepreneurPulse.com.au  Fact Sheet for Healthcare Providers: IncredibleEmployment.be  This test is not yet approved or cleared by the [place] FDA and has been authorized for detection and/or diagnosis of SARS-CoV-2 by FDA under an Emergency Use Authorization (EUA). This EUA will remain in effect (meaning this test can be used) for the duration of the COVID-19 declaration under Section 564(b)(1) of the Act, 21 U.S.C. section 360bbb-3(b)(1), unless the authorization is terminated or revoked.  Performed at [facility], [place] [address], [place], [place] [address]   Blood culture (routine x 2)     Status: None (Preliminary result)   Collection Time: 09/01/22  9:49 PM   Specimen: BLOOD LEFT WRIST  Result Value Ref Range Status   Specimen Description   Final    BLOOD LEFT WRIST Performed at [facility] [address], [place], [place] [address]    Special Requests   Final    BOTTLES DRAWN AEROBIC AND ANAEROBIC Blood Culture results may not be optimal due to an inadequate volume of blood received in culture bottles Performed at [facility] [address] [place], [place] [address]    Culture   Final    NO GROWTH 4 DAYS Performed at [facility], [place] [address], [place], [place] [address]  Report Status PENDING  Incomplete  Blood culture (routine x 2)     Status: None (Preliminary result)   Collection Time: 09/02/22  2:29 AM   Specimen: BLOOD  Result Value Ref Range Status   Specimen Description   Final    BLOOD BLOOD LEFT ARM Performed at [facility] [address], [place], [place] [address]    Special Requests   Final    BOTTLES DRAWN AEROBIC ONLY Blood Culture adequate volume Performed at [facility] [address] [place], [place] [address]    Culture   Final    NO GROWTH 4 DAYS Performed at [facility], [place] [address] [place], [place] [address]    Report Status PENDING  Incomplete     Radiology Studies: DG Chest 2 View  Result Date: 09/06/2022 CLINICAL DATA:  Pneumonia EXAM: CHEST - 2 VIEW COMPARISON:  Previous studies including the examination of 09/03/2022 FINDINGS: Transverse diameter of heart is increased. Central pulmonary vessels are prominent. Increased interstitial markings are seen in both parahilar regions and both lower lung fields. Small bilateral pleural effusions are seen. There is no pneumothorax. No significant interval changes are noted. There is soft tissue fullness in retrocardiac region suggesting possible fixed hiatal hernia. IMPRESSION: Increased interstitial markings in both lungs suggest possible interstitial pulmonary edema with no significant interval change. Small bilateral pleural effusions, more so on the left side. Possibility of atelectasis/pneumonia in the lower lung fields is not excluded. Electronically Signed   By: Elmer Picker M.D.   On: 09/06/2022 12:08    Scheduled Meds:  acetaminophen  1,000 mg Oral Q8H   cefdinir  300 mg Oral Q12H   feeding supplement  237 mL Oral BID BM   furosemide  40  mg Intravenous Once   guaiFENesin  600 mg Oral BID   heparin  5,000 Units Subcutaneous Q8H   levothyroxine  50 mcg Oral QAC breakfast   magic mouthwash  2 mL Oral TID   metoprolol tartrate  12.5 mg Oral BID   pantoprazole  40 mg Oral Daily   polyethylene glycol  17 g Oral Daily   Continuous Infusions:   LOS: 4 days   Darliss Cheney, MD [facility]  09/06/2022, 1:04 PM   *Please note that this is a verbal dictation therefore any spelling or grammatical errors are due to the "[place] One" system interpretation.  Please page via [place] and do not message via secure chat for urgent patient care matters. Secure chat can be used for non urgent patient care matters.  How to contact the [facility] Attending or Consulting provider [place] or covering provider during after hours [place], for this patient?  Check the care team in [facility] and look for a) attending/consulting TRH provider listed and b) the [facility] team listed. Page or secure chat [phone]. Log into [email or web address] and use [place] universal password to access. If you do not have the password, please contact the hospital operator. Locate the [facility] provider you are looking for under [facility] and page to a number that you can be directly reached. If you still have difficulty reaching the provider, please page the [facility] (Director on Call) for the Hospitalists listed on amion for assistance.

## 2022-09-07 ENCOUNTER — Inpatient Hospital Stay (HOSPITAL_COMMUNITY): Payer: Medicare Other

## 2022-09-07 DIAGNOSIS — I5021 Acute systolic (congestive) heart failure: Secondary | ICD-10-CM

## 2022-09-07 DIAGNOSIS — J189 Pneumonia, unspecified organism: Secondary | ICD-10-CM | POA: Diagnosis not present

## 2022-09-07 LAB — CBC WITH DIFFERENTIAL/PLATELET
Abs Immature Granulocytes: 0.07 10*3/uL (ref 0.00–0.07)
Basophils Absolute: 0 10*3/uL (ref 0.0–0.1)
Basophils Relative: 1 %
Eosinophils Absolute: 0.4 10*3/uL (ref 0.0–0.5)
Eosinophils Relative: 5 %
HCT: 38.8 % (ref 36.0–46.0)
Hemoglobin: 12.6 g/dL (ref 12.0–15.0)
Immature Granulocytes: 1 %
Lymphocytes Relative: 17 %
Lymphs Abs: 1.4 10*3/uL (ref 0.7–4.0)
MCH: 31.7 pg (ref 26.0–34.0)
MCHC: 32.5 g/dL (ref 30.0–36.0)
MCV: 97.7 fL (ref 80.0–100.0)
Monocytes Absolute: 1 10*3/uL (ref 0.1–1.0)
Monocytes Relative: 13 %
Neutro Abs: 5.3 10*3/uL (ref 1.7–7.7)
Neutrophils Relative %: 63 %
Platelets: 144 10*3/uL — ABNORMAL LOW (ref 150–400)
RBC: 3.97 MIL/uL (ref 3.87–5.11)
RDW: 15.5 % (ref 11.5–15.5)
WBC: 8.3 10*3/uL (ref 4.0–10.5)
nRBC: 0 % (ref 0.0–0.2)

## 2022-09-07 LAB — ECHOCARDIOGRAM COMPLETE
Area-P 1/2: 3.03 cm2
Calc EF: 71.4 %
Height: 63.8 in
S' Lateral: 1.6 cm
Single Plane A2C EF: 70.9 %
Single Plane A4C EF: 71.2 %
Weight: 2272 oz

## 2022-09-07 LAB — BASIC METABOLIC PANEL
Anion gap: 8 (ref 5–15)
BUN: 18 mg/dL (ref 8–23)
CO2: 25 mmol/L (ref 22–32)
Calcium: 8.7 mg/dL — ABNORMAL LOW (ref 8.9–10.3)
Chloride: 104 mmol/L (ref 98–111)
Creatinine, Ser: 1.26 mg/dL — ABNORMAL HIGH (ref 0.44–1.00)
GFR, Estimated: 39 mL/min — ABNORMAL LOW (ref >60)
Glucose, Bld: 101 mg/dL — ABNORMAL HIGH (ref 70–99)
Potassium: 4 mmol/L (ref 3.5–5.1)
Sodium: 137 mmol/L (ref 135–145)

## 2022-09-07 LAB — CULTURE, BLOOD (ROUTINE X 2)
Culture: NO GROWTH
Culture: NO GROWTH
Special Requests: ADEQUATE

## 2022-09-07 NOTE — TOC Transition Note (Signed)
Transition of Care [facility]) - CM/SW Discharge Note   Patient Details  Name: Cassandra Ray MRN: [id number] Date of Birth: 06-Jul-1922  [facility] CM/SW Contact:  Vassie Moselle, LCSW Phone Number: 09/07/2022, 12:33 PM   Clinical Narrative:    Pt is to transfer to [facility] prior to returning to ALF at [facility]. Pt will be going to room 158. RN to call report to [phone]. PTAR called for transportation at 12:30pm.    Final next level of care: [place] Barriers to Discharge: Barriers Resolved   Patient Goals and CMS Choice CMS Medicare.gov Compare Post Acute Care list provided to:: Patient Represenative (must comment) (Spouse) Choice offered to / list presented to : Spouse  Discharge Placement                Patient chooses bed at: [facility] Patient to be transferred to facility by: [facility] Name of family member notified: Spouse Patient and family notified of of transfer: 09/07/22  Discharge Plan and Services Additional resources added to the After Visit Summary for   In-house Referral: NA Discharge Planning Services: NA Post Acute Care Choice: NA          DME Arranged: N/A DME Agency: NA                  Social Determinants of Health (SDOH) Interventions SDOH Screenings   Food Insecurity: No Food Insecurity (09/02/2022)  Housing: Low Risk  (09/06/2022)  Transportation Needs: No Transportation Needs (09/02/2022)  Utilities: Not At Risk (09/02/2022)  Tobacco Use: Medium Risk (09/01/2022)     Readmission Risk Interventions    09/07/2022   12:32 PM 09/05/2022    2:23 PM  Readmission Risk Prevention Plan  Transportation Screening Complete Complete  PCP or Specialist Appt within 5-7 Days Complete Complete  Home Care Screening Complete Complete  Medication Review (RN CM) Complete Complete

## 2022-09-07 NOTE — Discharge Summary (Signed)
Physician Discharge Summary  Cassandra Ray [id number] DOB: [birth year] DOA: 09/01/2022  PCP: Lawerance Cruel, MD  Admit date: 09/01/2022 Discharge date: 09/07/2022 30 Day Unplanned Readmission Risk Score    Flowsheet Row ED to Hosp-Admission (Current) from 09/01/2022 in [facility]  30 Day Unplanned Readmission Risk Score (%) 17.95 Filed at 09/07/2022 0801       This score is the patient's risk of an unplanned readmission within 30 days of being discharged (0 -100%). The score is based on dignosis, age, lab data, medications, orders, and past utilization.   Low:  0-14.9   Medium: 15-21.9   High: 22-29.9   Extreme: 30 and above          Admitted From: SNF Disposition:  SNF  Recommendations for Outpatient Follow-up:  Follow up with PCP in 1-2 weeks Please obtain BMP/CBC in one week Please follow up with your PCP on the following pending results: Unresulted Labs (From admission, onward)     Start     Ordered   09/03/22 0500  CBC with Differential/Platelet  Tomorrow morning,   R        09/02/22 1713   09/02/22 0128  Expectorated Sputum Assessment w Gram Stain, Rflx to Resp Cult  (COPD / Pneumonia / Cellulitis / Lower Extremity Wound)  Once,   R        09/02/22 0131              Home Health: None Equipment/Devices:home oxygen   Discharge Condition: Fair/stable CODE STATUS: DNR Diet recommendation: Dysphagia 3 diet.  Subjective: Seen and examined.  Husband at bedside.  Patient has no new complaint.  Denied any shortness of breath.  Appears weak and tired.  Brief/Interim Summary: 89 year old white female [facility]--semiindependent at baseline uses walker and wheelchair husband lives with her at age 89 and helps her mobilize Prior breast cancer, right-sided Permanent A-fib previously on Eliquis-stopped because of Cassandra Ray lesions on EGD 01/09/2019 and dark stool Hemoccult positive-follows with Dr. [name] of  cardiology Hypothyroidism on Synthroid Presented from [facility] with outpatient failure pneumonia and in setting of having been seen 3/17 at [facility] primary care and starting on Augmentin she appears to have finished her last dose of Augmentin on 3/29-day of arrival at the ED.    Seen by admitting physician started on antibiotics fluids 4/1 esophagram very limited examination due to patient condition distal esophageal stricture?-Was coughing throughout exam so tablet was not administered tertiary contractions of esophagus noted moderate to large hiatal hernia noted.   3/31 2 view CXR = bilateral aspiration 3/30 discussed with [facility] cardiology who recommends to completely discontinue amnio 4/1 GI consult do not recommend endoscopy-high risk for complication with instrumentation.   Further details below.  Aspiration pneumonia on admission-with history of esophageal stricture and oropharyngeal dysphagia. Esophagram could not be completed because of patient coughing during procedure and tablet was not given GI does not recommend intervention --> Was transitioned to oral cefpodoxime 300 twice daily from IV antibiotics on 09/05/2022.  Repeat x-ray 09/06/22 did not show any infiltrates but shows pulmonary edema.  She is reassessed by SLP and has been advanced to dysphagia 3 diet.  However patient did have some crackles and audible gurgling, repeat chest x-ray showed pulmonary edema.  She was started on IV Lasix.  The gurgling and rhonchi is much better compared to yesterday.  She is on home Lasix 20 mg p.o. daily.  She is as stable as she  can be.  She will resume her home Lasix.  I have discussed in length with the patient's husband, who was opposed to her going home yesterday because he believes that she was not ready.  After my discussion, I informed him that due to patient having severe dysphagia, she will always be at risk of aspiration and that she has to be very careful with what she eats, she is supposed to  comply with dysphagia 3 diet.  He now understands and is agreeable with discharge plan today.  She will be discharged on few more days of oral antibiotics to complete course for pneumonia.     Hyponatremia: Resolved.   Mild AKI superimposed on CKD 3B: Baseline creatinine appears to be around 1.3, presented with 1.61 meeting criteria for AKI.  I do not see any evidence of metabolic acidosis as mentioned in previous notes.  Creatinine back to baseline now.   Permanent A-fib CHADVASC >4 has bled score >4-is not on Eliquis secondary to GI bleed discontinued amnio after discussion with cardiology given 89 and risk factors Recommends to reconsult cardiology if further issues with rate control etc. At this time continuing Lopressor 12.5 twice daily only, previously she was on Lopressor 25 mg p.o. twice daily.   Transaminitis Etiology possibly secondary to amiodarone which was cut back from LFTs slowly trending down   Hyperthyroid state in the setting of prior hypothyroidism Would keep thyroxine current dose 50 given hypermetabolic state of infection Needs TSH in 2 to 3 weeks outpatient   Prior breast cancer right-sided Restricted right arm outpatient surveillance per PCP  Discharge plan was discussed with patient and/or family member and they verbalized understanding and agreed with it.  Discharge Diagnoses:  Principal Problem:   Left lower lobe pneumonia Active Problems:   Essential hypertension   Hyponatremia   Chronic heart failure with preserved ejection fraction (HFpEF)   Paroxysmal atrial fibrillation   Transaminitis   CKD (chronic kidney disease), stage IV   CAP (community acquired pneumonia)   Palliative care by specialist    Discharge Instructions   Allergies as of 09/07/2022       Reactions   Cardizem [diltiazem]    Eliquis [apixaban]    Tape Other (See Comments)   Patient prefers easy-release tape        Medication List     STOP taking these medications     amiodarone 200 MG tablet Commonly known as: PACERONE   amoxicillin-clavulanate 875-125 MG tablet Commonly known as: AUGMENTIN   diphenhydramine-acetaminophen 25-500 MG Tabs tablet Commonly known as: TYLENOL PM   Robafen DM 10-100 MG/5ML liquid Generic drug: Dextromethorphan-guaiFENesin       TAKE these medications    acetaminophen 500 MG tablet Commonly known as: TYLENOL Take 2 tablets (1,000 mg total) by mouth every 8 (eight) hours. What changed: how much to take   Align 4 MG Caps Take 4 mg by mouth daily.   B-complex with vitamin C tablet Take 1 tablet by mouth daily.   benzonatate 100 MG capsule Commonly known as: TESSALON Take 100 mg by mouth 3 (three) times daily as needed for cough.   cefdinir 300 MG capsule Commonly known as: OMNICEF Take 1 capsule (300 mg total) by mouth every 12 (twelve) hours for 3 days.   furosemide 20 MG tablet Commonly known as: LASIX Take 1 tablet (20 mg total) by mouth daily.   ipratropium-albuterol 0.5-2.5 (3) MG/3ML Soln Commonly known as: DUONEB Take 3 mLs by nebulization 4 (four) times  daily.   ipratropium-albuterol 0.5-2.5 (3) MG/3ML Soln Commonly known as: DUONEB Take 3 mLs by nebulization 2 (two) times daily as needed (Wheezing/SOB).   levothyroxine 112 MCG tablet Commonly known as: SYNTHROID Take 112 mcg by mouth every morning.   melatonin 5 MG Tabs Take 5 mg by mouth at bedtime as needed (Sleep).   metoprolol tartrate 25 MG tablet Commonly known as: LOPRESSOR Take 0.5 tablets (12.5 mg total) by mouth 2 (two) times daily. What changed:  how much to take when to take this   Mucinex 600 MG 12 hr tablet Generic drug: guaiFENesin Take 600 mg by mouth in the morning.   guaiFENesin 600 MG 12 hr tablet Commonly known as: MUCINEX Take 600 mg by mouth daily as needed for cough.   nitroGLYCERIN 0.4 MG SL tablet Commonly known as: NITROSTAT Place 1 tablet (0.4 mg total) under the tongue every 5 (five) minutes as  needed for chest pain.   ondansetron 4 MG tablet Commonly known as: ZOFRAN Take 4 mg by mouth every 6 (six) hours as needed for nausea or vomiting.   pantoprazole 40 MG tablet Commonly known as: PROTONIX Take 1 tablet (40 mg total) by mouth daily.   polyethylene glycol powder 17 GM/SCOOP powder Commonly known as: GLYCOLAX/MIRALAX Take 17 g by mouth daily. What changed:  when to take this reasons to take this   sennosides-docusate sodium 8.6-50 MG tablet Commonly known as: SENOKOT-S Take 1 tablet by mouth at bedtime as needed for constipation. What changed: how much to take   Vitamin D3 50 MCG (2000 UT) Tabs Generic drug: Cholecalciferol Take 2,000 Units by mouth daily.        Follow-up Information     Lawerance Cruel, MD Follow up in 1 week(s).   Specialty: Family Medicine Contact information: [id number] [place] [address] [phone]                Allergies  Allergen Reactions   Cardizem [Diltiazem]    Eliquis [Apixaban]    Tape Other (See Comments)    Patient prefers easy-release tape    Consultations: Palliative care.   Procedures/Studies: DG Chest 2 View  Result Date: 09/06/2022 CLINICAL DATA:  Pneumonia EXAM: CHEST - 2 VIEW COMPARISON:  Previous studies including the examination of 09/03/2022 FINDINGS: Transverse diameter of heart is increased. Central pulmonary vessels are prominent. Increased interstitial markings are seen in both parahilar regions and both lower lung fields. Small bilateral pleural effusions are seen. There is no pneumothorax. No significant interval changes are noted. There is soft tissue fullness in retrocardiac region suggesting possible fixed hiatal hernia. IMPRESSION: Increased interstitial markings in both lungs suggest possible interstitial pulmonary edema with no significant interval change. Small bilateral pleural effusions, more so on the left side. Possibility of atelectasis/pneumonia in the lower lung  fields is not excluded. Electronically Signed   By: Elmer Picker M.D.   On: 09/06/2022 12:08   DG Swallowing Func-Speech Pathology  Result Date: 09/04/2022 Table formatting from the original result was not included. Modified Barium Swallow Study Patient Details Name: ANIYIA RHODD MRN: [id number] Date of Birth: January 11, 1923 Today's Date: 09/04/2022 HPI/PMH: HPI: Patient is a 89 year old female admitted to [facility] with left lower lobe pneumonia, hyponatremia.  Patient has history of GERD, breast cancer, dysphagia, hiatal hernia.  She was treated for pneumonia several days prior to admission but did not improve.  Prior esophagram in 2016 showed hiatal hernia as well as traction diverticulum below the  carina, normal motility, flash penetration of 50 liquid.  Her GI doctor at that time did not advise endoscopy.  Swallow evaluation ordered by Dr. Verlon Au.  CXR 09/03/2022 shows  Small left pleural effusion and mild interstitial edema.  Correlate for any signs/symptoms of CHF.  2. Decreased aeration to the left base may reflect atelectasis or  airspace disease. Clinical Impression: Clinical Impression: Patient was initially reluctant but particiapted fully in this MBS. SLP assessed her swallow with the following consistencies of barium: thin, nectar thick, puree. Full MBSimp protocol not followed secondary to patient's willingness and ability to participate. She exhibits a moderately impaired oral and mild-moderately impaired pharyngeal phase of swallow characterized by incomplete epiglottic inversion, weak tongue base retraction and reduced PES opening. i n addition, she appeared with xerostomia of oral cavity. These impairments led to prolonged anterior to posterior transit of bolus in oral cavity, mild and at times mild-moderate amount of barium residuals at base of tongue and vallecular sinus. PES opening appeared improved with larger bolus size (ie cup or straw sip as opposed to spoon). Although  only partial epiglottic inversion was observed with initial spoon sip of thin liquids, full inversion was observed with all subsequent swallows.  No penetration or aspiration observed with any of the tested consistencies. Patient did have incidents of wet sounding cough, but this was in the absence of any barium in laryngeal vestibule and was likely due to secretions. SLP recommending continue with full liquids, however plan to assess her toleration of advanced solids at bedside. Factors that may increase risk of adverse event in presence of aspiration ([place] 2021): Factors that may increase risk of adverse event in presence of aspiration ([place] 2021): Poor general health and/or compromised immunity; Frail or deconditioned; Weak cough; Reduced saliva Recommendations/Plan: Swallowing Evaluation Recommendations Swallowing Evaluation Recommendations Recommendations: PO diet PO Diet Recommendation: Full liquid diet; Thin liquids (Level 0) Liquid Administration via: Cup; Straw Medication Administration: Whole meds with puree Supervision: Full supervision/cueing for swallowing strategies; Full assist for feeding Swallowing strategies  : Slow rate; Small bites/sips; Check for pocketing or oral holding Postural changes: Position pt fully upright for meals; Stay upright 30-60 min after meals Oral care recommendations: Oral care BID (2x/day); Staff/trained caregiver to provide oral care Treatment Plan Treatment Plan Treatment recommendations: Therapy as outlined in treatment plan below Follow-up recommendations: Follow physicians's recommendations for discharge plan and follow up therapies Functional status assessment: Patient has had a recent decline in their functional status and demonstrates the ability to make significant improvements in function in a reasonable and predictable amount of time. Treatment frequency: Min 2x/week Treatment duration: 1 week Interventions: Compensatory techniques;  Patient/family education; Trials of upgraded texture/liquids; Diet toleration management by SLP Recommendations Recommendations for follow up therapy are one component of a multi-disciplinary discharge planning process, led by the attending physician.  Recommendations may be updated based on patient status, additional functional criteria and insurance authorization. Assessment: Orofacial Exam: Orofacial Exam Oral Cavity: Oral Hygiene: Xerostomia Oral Cavity - Dentition: Adequate natural dentition Orofacial Anatomy: WFL Anatomy: Anatomy: Prominent cricopharyngeus; Suspected cervical osteophytes Boluses Administered: Boluses Administered Boluses Administered: Thin liquids (Level 0); Mildly thick liquids (Level 2, nectar thick); Puree  Oral Impairment Domain: Oral Impairment Domain Lip Closure: Escape from interlabial space or lateral juncture, no extension beyond vermillion border Tongue control during bolus hold: Not tested Bolus transport/lingual motion: Slow tongue motion Oral residue: Trace residue lining oral structures Location of oral residue : Tongue; Palate Initiation of pharyngeal  swallow : Valleculae  Pharyngeal Impairment Domain: Pharyngeal Impairment Domain Soft palate elevation: No bolus between soft palate (SP)/pharyngeal wall (PW) Laryngeal elevation: Complete superior movement of thyroid cartilage with complete approximation of arytenoids to epiglottic petiole Anterior hyoid excursion: Complete anterior movement Epiglottic movement: Partial inversion Laryngeal vestibule closure: Complete, no air/contrast in laryngeal vestibule Pharyngeal stripping wave : Present - complete Pharyngeal contraction (A/P view only): N/A Pharyngoesophageal segment opening: Partial distention/partial duration, partial obstruction of flow Tongue base retraction: Trace column of contrast or air between tongue base and PPW Pharyngeal residue: Collection of residue within or on pharyngeal structures Location of pharyngeal  residue: Valleculae; Tongue base  Esophageal Impairment Domain: Esophageal Impairment Domain Esophageal clearance upright position: Esophageal retention Pill: Esophageal Impairment Domain Esophageal clearance upright position: Esophageal retention Penetration/Aspiration Scale Score: Penetration/Aspiration Scale Score 1.  Material does not enter airway: Thin liquids (Level 0); Mildly thick liquids (Level 2, nectar thick); Puree Compensatory Strategies: Compensatory Strategies Compensatory strategies: Yes Straw: Effective Effective Straw: Thin liquid (Level 0)   General Information: Caregiver present: No  Diet Prior to this Study: Full liquid diet   Temperature : Normal   Respiratory Status: WFL   Supplemental O2: None (Room air)   History of Recent Intubation: No  Behavior/Cognition: Alert; Cooperative Self-Feeding Abilities: Needs assist with self-feeding Baseline vocal quality/speech: Hypophonia/low volume No data recorded Volitional Swallow: Able to elicit Exam Limitations: No limitations Goal Planning: Prognosis for improved oropharyngeal function: Fair Barriers to Reach Goals: Severity of deficits; Behavior No data recorded Patient/Family Stated Goal: pt states "I'm dying" than says "I don't want to feel this way" Consulted and agree with results and recommendations: Pt unable/family or caregiver not available Pain: Pain Assessment Pain Assessment: No/denies pain End of Session: Start Time:SLP Start Time (ACUTE ONLY): 1110 Stop Time: SLP Stop Time (ACUTE ONLY): 1130 Time Calculation:SLP Time Calculation (min) (ACUTE ONLY): 20 min Charges: SLP Evaluations $ SLP Speech Visit: 1 Visit SLP Evaluations $BSS Swallow: 1 Procedure $MBS Swallow: 1 Procedure SLP visit diagnosis: SLP Visit Diagnosis: Dysphagia, oropharyngeal phase (R13.12) Past Medical History: Past Medical History: Diagnosis Date  Actinic keratoses   Arthritis   back   Breast cancer ([place])   Breast cancer (right)  Chronic diastolic CHF (congestive heart  failure) (HCC)   CKD (chronic kidney disease), stage III (H[facility])   Compression fracture of L3 lumbar vertebra   Dysphagia   GERD (gastroesophageal reflux disease)   takes otc Pepcid as needed  Hiatal hernia 11/27/2014  HTN (hypertension)   Hypothyroidism   Insomnia   Osteopenia   PAF (paroxysmal atrial fibrillation) ([place])   a. questionable episode in 2015 in [place]. b. event monitor through December/January 2014/2015 which showed no evidence of atrial fibrillation. c. Dx 12/2018.  PAT (paroxysmal atrial tachycardia)   Pleural effusion  Past Surgical History: Past Surgical History: Procedure Laterality Date  BREAST LUMPECTOMY Right 1995  lumpectomy  COLONOSCOPY    ESOPHAGOGASTRODUODENOSCOPY (EGD) WITH PROPOFOL Left 01/09/2019  Procedure: ESOPHAGOGASTRODUODENOSCOPY (EGD) WITH PROPOFOL;  Surgeon: Carol Ada, MD;  Location: [facility];  Service: Endoscopy;  Laterality: Left;  EYE SURGERY Bilateral   cataract w/ lens implant  KYPHOPLASTY N/A 09/18/2013  Procedure: KYPHOPLASTY;  Surgeon: Sinclair Ship, MD;  Location: [facility];  Service: Orthopedics;  Laterality: N/A;  Lumbar 3 kyphoplasty  TONSILLECTOMY   Sonia Baller, MA, CCC-SLP Speech Therapy   DG ESOPHAGUS W SINGLE CM (SOL OR THIN BA)  Result Date: 09/04/2022 CLINICAL DATA:  Coughing after eating and drinking. Evaluate for  an esophageal stricture. EXAM: ESOPHOGRAM/BARIUM SWALLOW TECHNIQUE: Single contrast examination was performed using  thin barium. FLUOROSCOPY: Radiation Exposure Index (as provided by the fluoroscopic device): 4.6 mGy Kerma COMPARISON:  None. FINDINGS: A very limited examination was performed in the recumbent LPO position, due to patient condition. Patient was only able to drink very minimal contrast with coughing after each swallow. Poor esophageal motility with tertiary contractions. Suspect a distal esophageal stricture. Study was terminated due to excessive coughing and patient condition. Moderate to large hiatal hernia. IMPRESSION: 1.  Very limited examination was performed due to patient condition, as detailed above. 2. Suspect a distal esophageal stricture. Tablet was not administered due to patient condition. 3. Esophageal dysmotility. 4. Moderate to large hiatal hernia. Electronically Signed   By: Lorin Picket M.D.   On: 09/04/2022 12:01   DG Chest 2 View  Result Date: 09/03/2022 CLINICAL DATA:  Evaluate pneumonia. EXAM: CHEST - 2 VIEW COMPARISON:  09/01/22 FINDINGS: Stable cardiomediastinal contours. Lung volumes are low with mild asymmetric elevation of the right hemidiaphragm. Increase interstitial markings are identified bilaterally concerning for mild interstitial edema. Small left pleural effusion identified. Decreased aeration to the left base may reflect atelectasis or airspace disease. IMPRESSION: 1. Small left pleural effusion and mild interstitial edema. Correlate for any signs/symptoms of CHF. 2. Decreased aeration to the left base may reflect atelectasis or airspace disease. Electronically Signed   By: Kerby Moors M.D.   On: 09/03/2022 17:16   US Abdomen Limited RUQ (LIVER/GB)  Result Date: 09/02/2022 CLINICAL DATA:  Elevated LFTs EXAM: ULTRASOUND ABDOMEN LIMITED RIGHT UPPER QUADRANT COMPARISON:  12/11/2014 FINDINGS: Gallbladder: No gallstones or wall thickening visualized. No sonographic Murphy sign noted by sonographer. Common bile duct: Diameter: 2.9 mm Liver: Mildly increased in echogenicity without evidence of focal mass. Portal vein is patent on color Doppler imaging with normal direction of blood flow towards the liver. Other: None. IMPRESSION: Fatty infiltration of the liver. No other focal abnormality is noted. Electronically Signed   By: Inez Catalina M.D.   On: 09/02/2022 02:53   DG Chest Port 1 View  Result Date: 09/01/2022 CLINICAL DATA:  Short of breath, pneumonia EXAM: PORTABLE CHEST 1 VIEW COMPARISON:  01/08/2019 FINDINGS: Single frontal view of the chest demonstrates a stable cardiac silhouette.  There is diffuse interstitial prominence throughout the lungs, with slight progression since prior study. Increased density at the left lateral lung base with loss of the margins of the left hemidiaphragm could reflect left lower lobe pneumonia. No large effusion or pneumothorax. No acute bony abnormality. IMPRESSION: 1. Increased density left lateral lung base which could reflect left lower lobe pneumonia. 2. Progressive interstitial prominence throughout the lungs, which could reflect progressive scarring or superimposed reactive airway disease. Electronically Signed   By: Randa Ngo M.D.   On: 09/01/2022 20:30     Discharge Exam: Vitals:   09/06/22 2153 09/07/22 0511  BP: (!) 162/66 137/65  Pulse: 63 65  Resp: 16 18  Temp: 97.7 F (36.5 C) 98 F (36.7 C)  SpO2: 99% 99%   Vitals:   09/06/22 1430 09/06/22 1938 09/06/22 2153 09/07/22 0511  BP:  (!) 153/68 (!) 162/66 137/65  Pulse:  71 63 65  Resp:  18 16 18   Temp:  97.9 F (36.6 C) 97.7 F (36.5 C) 98 F (36.7 C)  TempSrc:  Oral Oral Oral  SpO2: 96% 100% 99% 99%  Weight:      Height:        General: Pt  is alert, awake, not in acute distress Cardiovascular: RRR, S1/S2 +, no rubs, no gallops Respiratory: Rhonchi bilaterally, improved compared to yesterday. Abdominal: Soft, NT, ND, bowel sounds + Extremities: no edema, no cyanosis    The results of significant diagnostics from this hospitalization (including imaging, microbiology, ancillary and laboratory) are listed below for reference.     Microbiology: Recent Results (from the past 240 hour(s))  Resp panel by RT-PCR (RSV, Flu A&B, Covid) Anterior Nasal Swab     Status: None   Collection Time: 09/01/22  8:48 PM   Specimen: Anterior Nasal Swab  Result Value Ref Range Status   SARS Coronavirus 2 by RT PCR NEGATIVE NEGATIVE Final    Comment: (NOTE) SARS-CoV-2 target nucleic acids are NOT DETECTED.  The SARS-CoV-2 RNA is generally detectable in upper  respiratory specimens during the acute phase of infection. The lowest concentration of SARS-CoV-2 viral copies this assay can detect is 138 copies/mL. A negative result does not preclude SARS-Cov-2 infection and should not be used as the sole basis for treatment or other patient management decisions. A negative result may occur with  improper specimen collection/handling, submission of specimen other than nasopharyngeal swab, presence of viral mutation(s) within the areas targeted by this assay, and inadequate number of viral copies(<138 copies/mL). A negative result must be combined with clinical observations, patient history, and epidemiological information. The expected result is Negative.  Fact Sheet for Patients:  EntrepreneurPulse.com.au  Fact Sheet for Healthcare Providers:  IncredibleEmployment.be  This test is no t yet approved or cleared by the [place] FDA and  has been authorized for detection and/or diagnosis of SARS-CoV-2 by FDA under an Emergency Use Authorization (EUA). This EUA will remain  in effect (meaning this test can be used) for the duration of the COVID-19 declaration under Section 564(b)(1) of the Act, 21 U.S.C.section 360bbb-3(b)(1), unless the authorization is terminated  or revoked sooner.       Influenza A by PCR NEGATIVE NEGATIVE Final   Influenza B by PCR NEGATIVE NEGATIVE Final    Comment: (NOTE) The Xpert Xpress SARS-CoV-2/FLU/RSV plus assay is intended as an aid in the diagnosis of influenza from Nasopharyngeal swab specimens and should not be used as a sole basis for treatment. Nasal washings and aspirates are unacceptable for Xpert Xpress SARS-CoV-2/FLU/RSV testing.  Fact Sheet for Patients: EntrepreneurPulse.com.au  Fact Sheet for Healthcare Providers: IncredibleEmployment.be  This test is not yet approved or cleared by the [place] FDA and has been  authorized for detection and/or diagnosis of SARS-CoV-2 by FDA under an Emergency Use Authorization (EUA). This EUA will remain in effect (meaning this test can be used) for the duration of the COVID-19 declaration under Section 564(b)(1) of the Act, 21 U.S.C. section 360bbb-3(b)(1), unless the authorization is terminated or revoked.     Resp Syncytial Virus by PCR NEGATIVE NEGATIVE Final    Comment: (NOTE) Fact Sheet for Patients: EntrepreneurPulse.com.au  Fact Sheet for Healthcare Providers: IncredibleEmployment.be  This test is not yet approved or cleared by the [place] FDA and has been authorized for detection and/or diagnosis of SARS-CoV-2 by FDA under an Emergency Use Authorization (EUA). This EUA will remain in effect (meaning this test can be used) for the duration of the COVID-19 declaration under Section 564(b)(1) of the Act, 21 U.S.C. section 360bbb-3(b)(1), unless the authorization is terminated or revoked.  Performed at [facility], [place] [address] [place], [place] [address]   Blood culture (routine x 2)     Status:  None   Collection Time: 09/01/22  9:49 PM   Specimen: BLOOD LEFT WRIST  Result Value Ref Range Status   Specimen Description   Final    BLOOD LEFT WRIST Performed at [facility] [address], [place], [place] [address]    Special Requests   Final    BOTTLES DRAWN AEROBIC AND ANAEROBIC Blood Culture results may not be optimal due to an inadequate volume of blood received in culture bottles Performed at [facility] [address], [place], [place] [address]    Culture   Final    NO GROWTH 5 DAYS Performed at [facility], [place] [address], [place], [place] [address]    Report Status 09/07/2022 FINAL  Final  Blood culture (routine x 2)     Status: None   Collection Time: 09/02/22  2:29 AM   Specimen: BLOOD  Result Value Ref Range Status    Specimen Description   Final    BLOOD BLOOD LEFT ARM Performed at [facility] [address], [place], [place] [address]    Special Requests   Final    BOTTLES DRAWN AEROBIC ONLY Blood Culture adequate volume Performed at [facility] [address], [place], [place] [address]    Culture   Final    NO GROWTH 5 DAYS Performed at [facility], [place] [address], [place], [place] [address]    Report Status 09/07/2022 FINAL  Final     Labs: BNP (last 3 results) Recent Labs    09/02/22 0713  BNP [id number]*   Basic Metabolic Panel: Recent Labs  Lab 09/03/22 0642 09/04/22 0750 09/05/22 0556 09/06/22 0540 09/07/22 0915  NA 130* 134* 137 137 137  K 3.8 3.8 4.1 4.0 4.0  CL 101 106 110 110 104  CO2 23 22 22 23 25   GLUCOSE 89 75 77 85 101*  BUN 18 14 15 18 18   CREATININE 1.31* 1.15* 1.16* 1.26* 1.26*  CALCIUM 8.3* 8.4* 7.9* 8.6* 8.7*  PHOS  --  2.5 3.4 3.0  --    Liver Function Tests: Recent Labs  Lab 09/01/22 2037 09/02/22 1137 09/03/22 0642 09/04/22 0750 09/05/22 0556 09/06/22 0540  AST 314* 329* 239* 208*  --   --   ALT 285* 286* 240* 220*  --   --   ALKPHOS 165* 166* 151* 161*  --   --   BILITOT 0.8 0.8 0.9 1.0  --   --   PROT 6.1* 6.4* 5.5* 6.1*  --   --   ALBUMIN 2.8* 2.9* 2.3* 2.5*  2.5* 2.1* 2.4*   No results for input(s): "LIPASE", "AMYLASE" in the last 168 hours. No results for input(s): "AMMONIA" in the last 168 hours. CBC: Recent Labs  Lab 09/01/22 2037 09/02/22 0713 09/03/22 0642 09/07/22 0915  WBC 7.5 8.7 7.8 8.3  NEUTROABS 5.1  --  5.2 5.3  HGB 12.6 12.1 11.8* 12.6  HCT 38.4 36.1 35.0* 38.8  MCV 96.2 95.0 95.6 97.7  PLT 173 194 PLATELET CLUMPS NOTED ON SMEAR, UNABLE TO ESTIMATE 144*   Cardiac Enzymes: No results for input(s): "CKTOTAL", "CKMB", "CKMBINDEX", "TROPONINI" in the last 168 hours. BNP: Invalid input(s): "POCBNP" CBG: No results for input(s): "GLUCAP" in the last 168 hours. D-Dimer No  results for input(s): "DDIMER" in the last 72 hours. Hgb A1c No results for input(s): "HGBA1C" in the last 72 hours. Lipid Profile No results for input(s): "CHOL", "HDL", "LDLCALC", "TRIG", "CHOLHDL", "  LDLDIRECT" in the last 72 hours. Thyroid function studies No results for input(s): "TSH", "T4TOTAL", "T3FREE", "THYROIDAB" in the last 72 hours.  Invalid input(s): "FREET3" Anemia work up No results for input(s): "VITAMINB12", "FOLATE", "FERRITIN", "TIBC", "IRON", "RETICCTPCT" in the last 72 hours. Urinalysis    Component Value Date/Time   COLORURINE YELLOW 12/12/2018 2020   APPEARANCEUR CLEAR 12/12/2018 2020   LABSPEC 1.006 12/12/2018 2020   PHURINE 6.0 12/12/2018 2020   GLUCOSEU NEGATIVE 12/12/2018 2020   HGBUR NEGATIVE 12/12/2018 2020   BILIRUBINUR NEGATIVE 12/12/2018 2020   KETONESUR NEGATIVE 12/12/2018 2020   PROTEINUR NEGATIVE 12/12/2018 2020   UROBILINOGEN 0.2 04/24/2014 2257   NITRITE NEGATIVE 12/12/2018 2020   LEUKOCYTESUR NEGATIVE 12/12/2018 2020   Sepsis Labs Recent Labs  Lab 09/01/22 2037 09/02/22 0713 09/03/22 0642 09/07/22 0915  WBC 7.5 8.7 7.8 8.3   Microbiology Recent Results (from the past 240 hour(s))  Resp panel by RT-PCR (RSV, Flu A&B, Covid) Anterior Nasal Swab     Status: None   Collection Time: 09/01/22  8:48 PM   Specimen: Anterior Nasal Swab  Result Value Ref Range Status   SARS Coronavirus 2 by RT PCR NEGATIVE NEGATIVE Final    Comment: (NOTE) SARS-CoV-2 target nucleic acids are NOT DETECTED.  The SARS-CoV-2 RNA is generally detectable in upper respiratory specimens during the acute phase of infection. The lowest concentration of SARS-CoV-2 viral copies this assay can detect is 138 copies/mL. A negative result does not preclude SARS-Cov-2 infection and should not be used as the sole basis for treatment or other patient management decisions. A negative result may occur with  improper specimen collection/handling, submission of specimen  other than nasopharyngeal swab, presence of viral mutation(s) within the areas targeted by this assay, and inadequate number of viral copies(<138 copies/mL). A negative result must be combined with clinical observations, patient history, and epidemiological information. The expected result is Negative.  Fact Sheet for Patients:  EntrepreneurPulse.com.au  Fact Sheet for Healthcare Providers:  IncredibleEmployment.be  This test is no t yet approved or cleared by the [place] FDA and  has been authorized for detection and/or diagnosis of SARS-CoV-2 by FDA under an Emergency Use Authorization (EUA). This EUA will remain  in effect (meaning this test can be used) for the duration of the COVID-19 declaration under Section 564(b)(1) of the Act, 21 U.S.C.section 360bbb-3(b)(1), unless the authorization is terminated  or revoked sooner.       Influenza A by PCR NEGATIVE NEGATIVE Final   Influenza B by PCR NEGATIVE NEGATIVE Final    Comment: (NOTE) The Xpert Xpress SARS-CoV-2/FLU/RSV plus assay is intended as an aid in the diagnosis of influenza from Nasopharyngeal swab specimens and should not be used as a sole basis for treatment. Nasal washings and aspirates are unacceptable for Xpert Xpress SARS-CoV-2/FLU/RSV testing.  Fact Sheet for Patients: EntrepreneurPulse.com.au  Fact Sheet for Healthcare Providers: IncredibleEmployment.be  This test is not yet approved or cleared by the [place] FDA and has been authorized for detection and/or diagnosis of SARS-CoV-2 by FDA under an Emergency Use Authorization (EUA). This EUA will remain in effect (meaning this test can be used) for the duration of the COVID-19 declaration under Section 564(b)(1) of the Act, 21 U.S.C. section 360bbb-3(b)(1), unless the authorization is terminated or revoked.     Resp Syncytial Virus by PCR NEGATIVE NEGATIVE Final     Comment: (NOTE) Fact Sheet for Patients: EntrepreneurPulse.com.au  Fact Sheet for Healthcare Providers: IncredibleEmployment.be  This test is not yet approved  or cleared by the [place] and has been authorized for detection and/or diagnosis of SARS-CoV-2 by FDA under an Emergency Use Authorization (EUA). This EUA will remain in effect (meaning this test can be used) for the duration of the COVID-19 declaration under Section 564(b)(1) of the Act, 21 U.S.C. section 360bbb-3(b)(1), unless the authorization is terminated or revoked.  Performed at [facility], [place] [address] [place], [place] [address]   Blood culture (routine x 2)     Status: None   Collection Time: 09/01/22  9:49 PM   Specimen: BLOOD LEFT WRIST  Result Value Ref Range Status   Specimen Description   Final    BLOOD LEFT WRIST Performed at [facility] [address] [place], [place] [address]    Special Requests   Final    BOTTLES DRAWN AEROBIC AND ANAEROBIC Blood Culture results may not be optimal due to an inadequate volume of blood received in culture bottles Performed at [facility] [address] [place], [place] [address]    Culture   Final    NO GROWTH 5 DAYS Performed at [facility], [place] [address], [place], [place] [address]    Report Status 09/07/2022 FINAL  Final  Blood culture (routine x 2)     Status: None   Collection Time: 09/02/22  2:29 AM   Specimen: BLOOD  Result Value Ref Range Status   Specimen Description   Final    BLOOD BLOOD LEFT ARM Performed at [facility] [address] [place], [place] [address]    Special Requests   Final    BOTTLES DRAWN AEROBIC ONLY Blood Culture adequate volume Performed at [facility] [address], [place], [place] [address]    Culture   Final    NO GROWTH 5 DAYS Performed at [facility],  [place] [address], [place],  [address]    Report Status 09/07/2022 FINAL  Final     Time coordinating discharge: Over 30 minutes  SIGNED:   Darliss Cheney, MD  [facility] 09/07/2022, 9:52 AM *Please note that this is a verbal dictation therefore any spelling or grammatical errors are due to the "[employer]" system interpretation. If 7PM-7AM, please contact night-coverage [email or web address]

## 2022-09-07 NOTE — Progress Notes (Signed)
Echocardiogram 2D Echocardiogram has been performed.  Frances Furbish 09/07/2022, 9:51 AM

## 2022-09-08 DIAGNOSIS — M6259 Muscle wasting and atrophy, not elsewhere classified, multiple sites: Secondary | ICD-10-CM | POA: Diagnosis not present

## 2022-09-08 DIAGNOSIS — J690 Pneumonitis due to inhalation of food and vomit: Secondary | ICD-10-CM | POA: Diagnosis not present

## 2022-09-08 DIAGNOSIS — R2689 Other abnormalities of gait and mobility: Secondary | ICD-10-CM | POA: Diagnosis not present

## 2022-09-08 DIAGNOSIS — R1312 Dysphagia, oropharyngeal phase: Secondary | ICD-10-CM | POA: Diagnosis not present

## 2022-09-08 DIAGNOSIS — K219 Gastro-esophageal reflux disease without esophagitis: Secondary | ICD-10-CM | POA: Diagnosis not present

## 2022-09-11 ENCOUNTER — Encounter: Payer: Self-pay | Admitting: Internal Medicine

## 2022-09-11 ENCOUNTER — Non-Acute Institutional Stay (SKILLED_NURSING_FACILITY): Payer: Medicare Other | Admitting: Internal Medicine

## 2022-09-11 DIAGNOSIS — R7989 Other specified abnormal findings of blood chemistry: Secondary | ICD-10-CM | POA: Diagnosis not present

## 2022-09-11 DIAGNOSIS — I4891 Unspecified atrial fibrillation: Secondary | ICD-10-CM | POA: Diagnosis not present

## 2022-09-11 DIAGNOSIS — R1312 Dysphagia, oropharyngeal phase: Secondary | ICD-10-CM | POA: Diagnosis not present

## 2022-09-11 DIAGNOSIS — R531 Weakness: Secondary | ICD-10-CM

## 2022-09-11 DIAGNOSIS — E039 Hypothyroidism, unspecified: Secondary | ICD-10-CM

## 2022-09-11 DIAGNOSIS — I10 Essential (primary) hypertension: Secondary | ICD-10-CM

## 2022-09-11 DIAGNOSIS — N1832 Chronic kidney disease, stage 3b: Secondary | ICD-10-CM | POA: Diagnosis not present

## 2022-09-11 DIAGNOSIS — I5032 Chronic diastolic (congestive) heart failure: Secondary | ICD-10-CM | POA: Diagnosis not present

## 2022-09-11 DIAGNOSIS — R2689 Other abnormalities of gait and mobility: Secondary | ICD-10-CM | POA: Diagnosis not present

## 2022-09-11 DIAGNOSIS — J690 Pneumonitis due to inhalation of food and vomit: Secondary | ICD-10-CM

## 2022-09-11 DIAGNOSIS — M6259 Muscle wasting and atrophy, not elsewhere classified, multiple sites: Secondary | ICD-10-CM | POA: Diagnosis not present

## 2022-09-11 DIAGNOSIS — K219 Gastro-esophageal reflux disease without esophagitis: Secondary | ICD-10-CM | POA: Diagnosis not present

## 2022-09-11 NOTE — Progress Notes (Unsigned)
Provider:   Location:  [facility] Room Number: 158A Place of Service:  SNF (31)  PCP: Daisy Floro, MD Patient Care Team: Daisy Floro, MD as PCP - General (Family Medicine) Jake Bathe, MD as PCP - Cardiology (Cardiology)  Extended Emergency Contact Information Primary Emergency Contact: Rantz,Sarah Address: [address]          [place], [place] [address] D[place] of [place] Phone: [phone] Relation: Daughter Secondary Emergency Contact: Merrily Pew Address: [address]          [place], [place] [address] Darden Amber of [place] Home Phone: [phone] Mobile Phone: [phone] Relation: Spouse  Code Status: DNR Goals of Care: Advanced Directive information    09/11/2022   11:19 AM  Advanced Directives  Does Patient Have a Medical Advance Directive? Yes  Type of Estate agent of [place];Living will;Out of facility DNR (pink MOST or yellow form)  Does patient want to make changes to medical advance directive? No - Patient declined      Chief Complaint  Patient presents with   New Admit To SNF    Patient is being seen for admit to SNF   Immunizations    Discuss the need for Tetanus Vaccine     HPI: Patient is a 89 y.o. female seen today for admission to Rehab  Admitted to the hospital from 03/29-04/04 for Aspiration Pneumonia  Patient has h/o PAF not on anticoagulation due to history of GI bleed, CKD stage IV, GERD, hypertension, hypothyroidism, s/p right breast lumpectomy cancer  She came to ED with shortness of breath after she failed treatment with Augmentin as outpatient.  Her chest x-ray showed left lower lobe pneumonia Was treated with ceftriaxone and vancomycin Was seen by Dr. Bosie Clos for dysphagia and concern for aspiration pneumonia barium swallow shows questionable distal esophageal stricture with poor esophageal motility They decided not to pursue EGD at  this time and manage Conservatively  Echo Showed EF of 65-70%  Repeat Chest Xray Positive for some pulmonary Edema IV lasix was given too  Transaminitis Thought to be due to Amiodarone and is now off it per cardiology US showed Fatty Liver  Here in Rehab continues to feel SOB on oxygen very weak Using Hoyer Weak Cough Poor Appetite No Fever or Chest pain Also c/o Pain and Burning in her mouth  Past Medical History:  Diagnosis Date   Actinic keratoses    Arthritis    back    Breast cancer    Breast cancer (right)   Chronic diastolic CHF (congestive heart failure)    CKD (chronic kidney disease), stage III    Compression fracture of L3 lumbar vertebra    Dysphagia    GERD (gastroesophageal reflux disease)    takes otc Pepcid as needed   Hiatal hernia 11/27/2014   HTN (hypertension)    Hypothyroidism    Insomnia    Osteopenia    PAF (paroxysmal atrial fibrillation)    a. questionable episode in 2015 in [place]. b. event monitor through December/January 2014/2015 which showed no evidence of atrial fibrillation. c. Dx 12/2018.   PAT (paroxysmal atrial tachycardia)    Pleural effusion    Past Surgical History:  Procedure Laterality Date   BREAST LUMPECTOMY Right 1995   lumpectomy   COLONOSCOPY     ESOPHAGOGASTRODUODENOSCOPY (EGD) WITH PROPOFOL Left 01/09/2019   Procedure: ESOPHAGOGASTRODUODENOSCOPY (EGD) WITH PROPOFOL;  Surgeon: Jeani Hawking, MD;  Location: [facility];  Service: Endoscopy;  Laterality:  Left;   EYE SURGERY Bilateral    cataract w/ lens implant   KYPHOPLASTY N/A 09/18/2013   Procedure: KYPHOPLASTY;  Surgeon: Emilee Hero, MD;  [facility]: [facility] OR;  Service: Orthopedics;  Laterality: N/A;  Lumbar 3 kyphoplasty   TONSILLECTOMY      reports that she has quit smoking. She has never used smokeless tobacco. She reports current alcohol use of about 7.0 standard drinks of alcohol per week. She reports that she does not use drugs. Social History   Socioeconomic  History   Marital status: Married    Spouse name: Not on file   Number of children: Not on file   Years of education: Not on file   Highest education level: Not on file  Occupational History   Not on file  Tobacco Use   Smoking status: Former   Smokeless tobacco: Never   Tobacco comments:    only in college  Vaping Use   Vaping Use: Never used  Substance and Sexual Activity   Alcohol use: Yes    Alcohol/week: 7.0 standard drinks of alcohol    Types: 7 Glasses of wine per week    Comment: 1 glass of wine a day   Drug use: No   Sexual activity: Not on file  Other Topics Concern   Not on file  Social History Narrative   Not on file   Social Determinants of Health   Financial Resource Strain: Not on file  Food Insecurity: No Food Insecurity (09/02/2022)   Hunger Vital Sign    Worried About Running Out of Food in the Last Year: Never true    Ran Out of Food in the Last Year: Never true  Transportation Needs: No Transportation Needs (09/02/2022)   PRAPARE - Administrator, Civil Service (Medical): No    Lack of Transportation (Non-Medical): No  Physical Activity: Not on file  Stress: Not on file  Social Connections: Not on file  Intimate Partner Violence: Not At Risk (09/02/2022)   Humiliation, Afraid, Rape, and Kick questionnaire    Fear of Current or Ex-Partner: No    Emotionally Abused: No    Physically Abused: No    Sexually Abused: No    Functional Status Survey:    Family History  Problem Relation Age of Onset   CAD Mother    Diabetes Mellitus II Mother    Heart failure Mother    Heart failure Father    Prostate cancer Brother    Kidney cancer Brother    Heart disease Sister    Esophageal cancer Neg Hx    Colon cancer Neg Hx     Health Maintenance  Topic Date Due   DTaP/Tdap/Td (2 - Td or Tdap) 08/10/2021   INFLUENZA VACCINE  01/04/2023   Pneumonia Vaccine 4+ Years old  Completed   DEXA SCAN  Completed   COVID-19 Vaccine  Completed    Zoster Vaccines- Shingrix  Completed   HPV VACCINES  Aged Out    Allergies  Allergen Reactions   Cardizem [Diltiazem]    Eliquis [Apixaban]    Tape Other (See Comments)    Patient prefers easy-release tape    Outpatient Encounter Medications as of 09/11/2022  Medication Sig   acetaminophen (TYLENOL) 500 MG tablet Take 2 tablets (1,000 mg total) by mouth every 8 (eight) hours. (Patient taking differently: Take 500 mg by mouth every 8 (eight) hours. Take 2 tablets po every 8 hours)   B Complex-C (B-COMPLEX WITH VITAMIN C)  tablet Take 1 tablet by mouth daily.   benzonatate (TESSALON) 100 MG capsule Take 100 mg by mouth 3 (three) times daily as needed for cough.   Cholecalciferol (VITAMIN D3) 2000 units TABS Take 2,000 Units by mouth daily.    furosemide (LASIX) 20 MG tablet Take 1 tablet (20 mg total) by mouth daily.   guaiFENesin (MUCINEX) 600 MG 12 hr tablet Take 600 mg by mouth in the morning.   guaiFENesin (MUCINEX) 600 MG 12 hr tablet Take 600 mg by mouth daily as needed for cough.   ipratropium-albuterol (DUONEB) 0.5-2.5 (3) MG/3ML SOLN Take 3 mLs by nebulization 2 (two) times daily as needed (Wheezing/SOB).   ipratropium-albuterol (DUONEB) 0.5-2.5 (3) MG/3ML SOLN Take 3 mLs by nebulization 4 (four) times daily.   levothyroxine (SYNTHROID) 112 MCG tablet Take 112 mcg by mouth every morning.   melatonin 5 MG TABS Take 5 mg by mouth at bedtime as needed (Sleep).   metoprolol tartrate (LOPRESSOR) 25 MG tablet Take 0.5 tablets (12.5 mg total) by mouth 2 (two) times daily.   nitroGLYCERIN (NITROSTAT) 0.4 MG SL tablet Place 1 tablet (0.4 mg total) under the tongue every 5 (five) minutes as needed for chest pain.   ondansetron (ZOFRAN) 4 MG tablet Take 4 mg by mouth every 6 (six) hours as needed for nausea or vomiting.   pantoprazole (PROTONIX) 40 MG tablet Take 1 tablet (40 mg total) by mouth daily.   polyethylene glycol powder (GLYCOLAX/MIRALAX) 17 GM/SCOOP powder Take 17 g by mouth daily.    Probiotic Product (ALIGN) 4 MG CAPS Take 4 mg by mouth daily.   sennosides-docusate sodium (SENOKOT-S) 8.6-50 MG tablet Take 1 tablet by mouth at bedtime as needed for constipation.   Facility-Administered Encounter Medications as of 09/11/2022  Medication   bisacodyl (DULCOLAX) suppository 10 mg    Review of Systems  Constitutional:  Positive for activity change and appetite change.  HENT: Negative.    Respiratory:  Positive for cough, shortness of breath and wheezing.   Cardiovascular:  Positive for leg swelling.  Gastrointestinal:  Negative for constipation.  Genitourinary: Negative.   Musculoskeletal:  Positive for gait problem. Negative for arthralgias and myalgias.  Skin: Negative.   Neurological:  Positive for weakness. Negative for dizziness.  Psychiatric/Behavioral:  Positive for confusion. Negative for dysphoric mood and sleep disturbance.     Vitals:   09/11/22 1109  BP: (!) 157/69  Pulse: 64  Resp: 20  Temp: 97.8 F (36.6 C)  TempSrc: Temporal  SpO2: 99%  Height: 5\' 3"  (1.6 m)   Body mass index is 25.15 kg/m. Physical Exam Vitals reviewed.  Constitutional:      Appearance: Normal appearance.  HENT:     Head: Normocephalic.     Nose: Nose normal.     Mouth/Throat:     Mouth: Mucous membranes are moist.     Pharynx: Oropharynx is clear.     Comments: Mucositis  Eyes:     Pupils: Pupils are equal, round, and reactive to light.  Cardiovascular:     Rate and Rhythm: Normal rate and regular rhythm.     Pulses: Normal pulses.     Heart sounds: Normal heart sounds. No murmur heard. Pulmonary:     Effort: Pulmonary effort is normal.     Breath sounds: Wheezing and rales present.  Abdominal:     General: Abdomen is flat. Bowel sounds are normal.     Palpations: Abdomen is soft.  Musculoskeletal:  General: No swelling.     Cervical back: Neck supple.     Comments: Mild Edema Bilateral  Skin:    General: Skin is warm.  Neurological:     General:  No focal deficit present.     Mental Status: She is alert and oriented to person, place, and time.  Psychiatric:        Mood and Affect: Mood normal.        Thought Content: Thought content normal.     Labs reviewed: Basic Metabolic Panel: Recent Labs    09/04/22 0750 09/05/22 0556 09/06/22 0540 09/07/22 0915  NA 134* 137 137 137  K 3.8 4.1 4.0 4.0  CL 106 110 110 104  CO2 22 22 23 25   GLUCOSE 75 77 85 101*  BUN 14 15 18 18   CREATININE 1.15* 1.16* 1.26* 1.26*  CALCIUM 8.4* 7.9* 8.6* 8.7*  PHOS 2.5 3.4 3.0  --    Liver Function Tests: Recent Labs    09/02/22 1137 09/03/22 0642 09/04/22 0750 09/05/22 0556 09/06/22 0540  AST 329* 239* 208*  --   --   ALT 286* 240* 220*  --   --   ALKPHOS 166* 151* 161*  --   --   BILITOT 0.8 0.9 1.0  --   --   PROT 6.4* 5.5* 6.1*  --   --   ALBUMIN 2.9* 2.3* 2.5*  2.5* 2.1* 2.4*   No results for input(s): "LIPASE", "AMYLASE" in the last 8760 hours. No results for input(s): "AMMONIA" in the last 8760 hours. CBC: Recent Labs    09/01/22 2037 09/02/22 0713 09/03/22 0642 09/07/22 0915  WBC 7.5 8.7 7.8 8.3  NEUTROABS 5.1  --  5.2 5.3  HGB 12.6 12.1 11.8* 12.6  HCT 38.4 36.1 35.0* 38.8  MCV 96.2 95.0 95.6 97.7  PLT 173 194 PLATELET CLUMPS NOTED ON SMEAR, UNABLE TO ESTIMATE 144*   Cardiac Enzymes: No results for input(s): "CKTOTAL", "CKMB", "CKMBINDEX", "TROPONINI" in the last 8760 hours. BNP: Invalid input(s): "POCBNP" No results found for: "HGBA1C" Lab Results  Component Value Date   TSH 0.067 (L) 09/02/2022   No results found for: "VITAMINB12" No results found for: "FOLATE" No results found for: "IRON", "TIBC", "FERRITIN"  Imaging and Procedures obtained prior to SNF admission: US Abdomen Limited RUQ (LIVER/GB)  Result Date: 09/02/2022 CLINICAL DATA:  Elevated LFTs EXAM: ULTRASOUND ABDOMEN LIMITED RIGHT UPPER QUADRANT COMPARISON:  12/11/2014 FINDINGS: Gallbladder: No gallstones or wall thickening visualized. No  sonographic Murphy sign noted by sonographer. Common bile duct: Diameter: 2.9 mm Liver: Mildly increased in echogenicity without evidence of focal mass. Portal vein is patent on color Doppler imaging with normal direction of blood flow towards the liver. Other: None. IMPRESSION: Fatty infiltration of the liver. No other focal abnormality is noted. Electronically Signed   By: Alcide CleverMark  Lukens M.D.   On: 09/02/2022 02:53   DG Chest Port 1 View  Result Date: 09/01/2022 CLINICAL DATA:  Short of breath, pneumonia EXAM: PORTABLE CHEST 1 VIEW COMPARISON:  01/08/2019 FINDINGS: Single frontal view of the chest demonstrates a stable cardiac silhouette. There is diffuse interstitial prominence throughout the lungs, with slight progression since prior study. Increased density at the left lateral lung base with loss of the margins of the left hemidiaphragm could reflect left lower lobe pneumonia. No large effusion or pneumothorax. No acute bony abnormality. IMPRESSION: 1. Increased density left lateral lung base which could reflect left lower lobe pneumonia. 2. Progressive interstitial prominence throughout the lungs, which  could reflect progressive scarring or superimposed reactive airway disease. Electronically Signed   By: Sharlet Salina M.D.   On: 09/01/2022 20:30    Assessment/Plan 1. Aspiration pneumonia, unspecified aspiration pneumonia type, unspecified laterality, unspecified part of lung Finishing Cefdinir Also Has Wheezing On Duo Nebs 4/day Will start her on Prednisone 40 mg Taper over 12 days   2. Chronic diastolic heart failure On Lasix will Watch her Weight If continues to have rales will Increase the dose to 40 mg  3. Primary hypertension Lasix and Metoprolol  4. Hypothyroidism, unspecified type TSH has been low Now that she is off Amiodarone Repeat TSH is pending 5. Stage 3b chronic kidney disease Repeat Labs pending  6. Elevated LFTs ? Due to Amiodarone Will repeat Labs  7. Atrial  fibrillation with RVR Off Amiodarone No eliquis due to h/o GI  Bleeding  8. Weakness Therapy  9 Dysphagia Regular Chopped D3 On Supervioson Feeding Very high risk for aspiration Husband aware  10 Mucositis Will try Magic Mouth wash QID  Family/ staff Communication:   Labs/tests ordered: Labs pending

## 2022-09-12 DIAGNOSIS — M6259 Muscle wasting and atrophy, not elsewhere classified, multiple sites: Secondary | ICD-10-CM | POA: Diagnosis not present

## 2022-09-12 DIAGNOSIS — K219 Gastro-esophageal reflux disease without esophagitis: Secondary | ICD-10-CM | POA: Diagnosis not present

## 2022-09-12 DIAGNOSIS — R2689 Other abnormalities of gait and mobility: Secondary | ICD-10-CM | POA: Diagnosis not present

## 2022-09-12 DIAGNOSIS — R1312 Dysphagia, oropharyngeal phase: Secondary | ICD-10-CM | POA: Diagnosis not present

## 2022-09-12 DIAGNOSIS — J690 Pneumonitis due to inhalation of food and vomit: Secondary | ICD-10-CM | POA: Diagnosis not present

## 2022-09-13 ENCOUNTER — Non-Acute Institutional Stay: Payer: Medicare Other | Admitting: Family Medicine

## 2022-09-13 VITALS — BP 128/60 | HR 72 | Temp 97.2°F | Resp 24

## 2022-09-13 DIAGNOSIS — J690 Pneumonitis due to inhalation of food and vomit: Secondary | ICD-10-CM

## 2022-09-13 DIAGNOSIS — K219 Gastro-esophageal reflux disease without esophagitis: Secondary | ICD-10-CM | POA: Diagnosis not present

## 2022-09-13 DIAGNOSIS — R531 Weakness: Secondary | ICD-10-CM

## 2022-09-13 DIAGNOSIS — R7401 Elevation of levels of liver transaminase levels: Secondary | ICD-10-CM

## 2022-09-13 DIAGNOSIS — R1312 Dysphagia, oropharyngeal phase: Secondary | ICD-10-CM | POA: Diagnosis not present

## 2022-09-13 DIAGNOSIS — R2689 Other abnormalities of gait and mobility: Secondary | ICD-10-CM | POA: Diagnosis not present

## 2022-09-13 DIAGNOSIS — M6259 Muscle wasting and atrophy, not elsewhere classified, multiple sites: Secondary | ICD-10-CM | POA: Diagnosis not present

## 2022-09-13 DIAGNOSIS — J9601 Acute respiratory failure with hypoxia: Secondary | ICD-10-CM

## 2022-09-13 NOTE — Progress Notes (Signed)
[facility] Consult Note Telephone: [phone]  Fax: [phone]   Date of encounter: 09/13/22 9:42 AM PATIENT NAME: Cassandra Ray [address] [place] [address]   [phone] (home)  DOB: 1922-06-12 MRN: [id number] PRIMARY CARE PROVIDER:    Daisy Florooss, Charles Alan, MD,  [address] [place] [address] [phone]  REFERRING PROVIDER:   Daisy Florooss, Charles Alan, MD [address] [place] [address] [phone]  Health Care Agent/Health Care Power of Attorney:    Contact Information     Name Relation Home Work Mobile   CuneyLatture,Sarah Daughter   [phone]   Nelwyn SalisburyLatture,William E Spouse [phone]  [phone]   Kermit BaloLatture,Richard Son [phone]         I met face to face with patient in [facility] facility. Palliative Care was asked to follow this patient by consultation request of Daisy Florooss, Charles Alan, MD to address advance care planning and complex medical decision making. This is an initial visit.  ADVANCE CARE PLANNING/GOALS OF CARE: Patient/health care surrogate gave his/her permission to discuss.   PRESENT FOR DISCUSSION: GOALS: BARRIERS:  HEALTH CARE POA/HEALTH CARE DECISION MAKER AND PHONE NUMBER:  Our advance care planning conversation included a discussion about:    The value and importance of advance care planning  Experiences with loved ones who have been seriously ill or have died  Exploration of personal, cultural or spiritual beliefs that might influence medical decisions  Review, updating or creation of an advance directive document.  CODE STATUS: DNR  I spent ***   minutes providing this consultation with more than 50% of the time spent on counseling patient and coordinating communication with referring provider, staff/family, chart review and  documentation. --------------------------------------------------------------------------------------------------------------------------------------------------------------------------------------------  ASSESSMENT AND / RECOMMENDATIONS:      PPS:  Follow up Palliative Care Visit:  Palliative Care continuing to follow up by monitoring for changes in appetite, weight, functional and cognitive status for chronic disease progression and management in agreement with patient's stated goals of care. Next visit in *** weeks or prn.  This visit was coded based on medical decision making (MDM).  Chief Complaint *** HISTORY OF PRESENT ILLNESS: NAME@ is a 89 y.o. year old female with *** .  Receiving PT/OT/ST for swallowing.  Recent hospitalization for pneumonia.  On March 24, 24 MMSE was 25/30 with correct clock drawing. Completed a 3 day course of oral Cefdinir BID on 09/10/22 for aspiration pneumonia. Had a course of Augmentin outpatient, failed therapy went to hospital and was treated with IV Cefpodoxime. Off Amiodaraone since hospitalization, not on anticoagulation due to GI bleed. Wearing O2 @ 2L/min. Per Caregiver-Using barrier cream on bottom, has some redness and using a duoderm.  On dysphagia 3 diet.  Caregiver reports non-productive cough but is not necessarily in context with eating and drinking. Just completed breathing treatment   ACTIVITIES OF DAILY LIVING: CONTINENT OF BLADDER? No CONTINENT OF BOWEL? Yes BATHING/DRESSING Max assist FEEDING-caregiver feeding  MOBILITY:   AMBULATORY with walker?  Yes with limited assist/walker   APPETITE? Weight 142 lbs as of 09/07/22  CURRENT PROBLEM LIST:  Patient Active Problem List   Diagnosis Date Noted   Palliative care by specialist 09/06/2022   Left lower lobe pneumonia 09/02/2022   Transaminitis 09/02/2022   CKD (chronic kidney disease), stage IV 09/02/2022   CAP (community acquired pneumonia) 09/02/2022   Chronic kidney disease, stage  III (moderate) 08/09/2021   Upper GI bleed 01/08/2019   Atrial fibrillation  with RVR    Paroxysmal atrial fibrillation 12/12/2018   Acute kidney injury superimposed on chronic kidney disease 12/12/2018   Leukocytosis 12/12/2018   Gastroesophageal reflux disease    Weakness    Tachycardia 05/24/2018   Acute respiratory failure with hypoxia 05/23/2018   Bilateral impacted cerumen 11/22/2016   Presbycusis of both ears 11/22/2016   Pleural effusion, right 02/08/2016   Acute on chronic diastolic CHF (congestive heart failure) 02/08/2016   Lower extremity edema 01/31/2016   Chronic heart failure with preserved ejection fraction (HFpEF) 02/03/2015   Hyponatremia 04/25/2014   Shortness of breath    Compression fracture 09/18/2013   Knee pain 08/11/2013   Back pain 05/09/2013   Essential hypertension    Hypothyroidism    PAST MEDICAL HISTORY:  Active Ambulatory Problems    Diagnosis Date Noted   Essential hypertension    Hypothyroidism    Back pain 05/09/2013   Knee pain 08/11/2013   Compression fracture 09/18/2013   Hyponatremia 04/25/2014   Shortness of breath    Chronic heart failure with preserved ejection fraction (HFpEF) 02/03/2015   Lower extremity edema 01/31/2016   Pleural effusion, right 02/08/2016   Acute on chronic diastolic CHF (congestive heart failure) 02/08/2016   Bilateral impacted cerumen 11/22/2016   Presbycusis of both ears 11/22/2016   Acute respiratory failure with hypoxia 05/23/2018   Tachycardia 05/24/2018   Paroxysmal atrial fibrillation 12/12/2018   Acute kidney injury superimposed on chronic kidney disease 12/12/2018   Leukocytosis 12/12/2018   Gastroesophageal reflux disease    Weakness    Atrial fibrillation with RVR    Upper GI bleed 01/08/2019   Chronic kidney disease, stage III (moderate) 08/09/2021   Left lower lobe pneumonia 09/02/2022   Transaminitis 09/02/2022   CKD (chronic kidney disease), stage IV 09/02/2022   CAP (community acquired  pneumonia) 09/02/2022   Palliative care by specialist 09/06/2022   Resolved Ambulatory Problems    Diagnosis Date Noted   A-fib 05/09/2013   Acute on chronic diastolic CHF (congestive heart failure) 04/24/2014   Hypertensive urgency 04/24/2014   Acute diastolic CHF (congestive heart failure), NYHA class 3 04/25/2014   Past Medical History:  Diagnosis Date   Actinic keratoses    Arthritis    Breast cancer    Chronic diastolic CHF (congestive heart failure)    CKD (chronic kidney disease), stage III    Compression fracture of L3 lumbar vertebra    Dysphagia    GERD (gastroesophageal reflux disease)    Hiatal hernia 11/27/2014   HTN (hypertension)    Insomnia    Osteopenia    PAF (paroxysmal atrial fibrillation)    PAT (paroxysmal atrial tachycardia)    Pleural effusion    SOCIAL HX:  Social History   Tobacco Use   Smoking status: Former   Smokeless tobacco: Never   Tobacco comments:    only in college  Substance Use Topics   Alcohol use: Yes    Alcohol/week: 7.0 standard drinks of alcohol    Types: 7 Glasses of wine per week    Comment: 1 glass of wine a day   FAMILY HX:  Family History  Problem Relation Age of Onset   CAD Mother    Diabetes Mellitus II Mother    Heart failure Mother    Heart failure Father    Prostate cancer Brother    Kidney cancer Brother    Heart disease Sister    Esophageal cancer Neg Hx    Colon cancer Neg Hx  Preferred Pharmacy: [facility]:  Allergies  Allergen Reactions   Cardizem [Diltiazem]    Eliquis [Apixaban]    Tape Other (See Comments)    Patient prefers easy-release tape     PERTINENT MEDICATIONS:  Outpatient Encounter Medications as of 09/13/2022  Medication Sig   acetaminophen (TYLENOL) 500 MG tablet Take 2 tablets (1,000 mg total) by mouth every 8 (eight) hours. (Patient taking differently: Take 500 mg by mouth every 8 (eight) hours. Take 2 tablets po every 8 hours)   B Complex-C (B-COMPLEX WITH VITAMIN C)  tablet Take 1 tablet by mouth daily.   benzonatate (TESSALON) 100 MG capsule Take 100 mg by mouth 3 (three) times daily as needed for cough.   Cholecalciferol (VITAMIN D3) 2000 units TABS Take 2,000 Units by mouth daily.    furosemide (LASIX) 20 MG tablet Take 1 tablet (20 mg total) by mouth daily.   guaiFENesin (MUCINEX) 600 MG 12 hr tablet Take 600 mg by mouth in the morning.   guaiFENesin (MUCINEX) 600 MG 12 hr tablet Take 600 mg by mouth daily as needed for cough.   ipratropium-albuterol (DUONEB) 0.5-2.5 (3) MG/3ML SOLN Take 3 mLs by nebulization 2 (two) times daily as needed (Wheezing/SOB).   ipratropium-albuterol (DUONEB) 0.5-2.5 (3) MG/3ML SOLN Take 3 mLs by nebulization 4 (four) times daily.   levothyroxine (SYNTHROID) 112 MCG tablet Take 112 mcg by mouth every morning.   melatonin 5 MG TABS Take 5 mg by mouth at bedtime as needed (Sleep).   metoprolol tartrate (LOPRESSOR) 25 MG tablet Take 0.5 tablets (12.5 mg total) by mouth 2 (two) times daily.   nitroGLYCERIN (NITROSTAT) 0.4 MG SL tablet Place 1 tablet (0.4 mg total) under the tongue every 5 (five) minutes as needed for chest pain.   ondansetron (ZOFRAN) 4 MG tablet Take 4 mg by mouth every 6 (six) hours as needed for nausea or vomiting.   pantoprazole (PROTONIX) 40 MG tablet Take 1 tablet (40 mg total) by mouth daily.   polyethylene glycol powder (GLYCOLAX/MIRALAX) 17 GM/SCOOP powder Take 17 g by mouth daily.   Probiotic Product (ALIGN) 4 MG CAPS Take 4 mg by mouth daily.   sennosides-docusate sodium (SENOKOT-S) 8.6-50 MG tablet Take 1 tablet by mouth at bedtime as needed for constipation.   Facility-Administered Encounter Medications as of 09/13/2022  Medication   bisacodyl (DULCOLAX) suppository 10 mg    History obtained from review of EMR, discussion with primary team, and interview with family, facility staff/caregiver and/or patient.   CBC    Component Value Date/Time   WBC 8.3 09/07/2022 0915   RBC 3.97 09/07/2022 0915    HGB 12.6 09/07/2022 0915   HGB 13.2 09/21/2020 1358   HCT 38.8 09/07/2022 0915   HCT 39.6 09/21/2020 1358   PLT 144 (L) 09/07/2022 0915   PLT 172 09/21/2020 1358   MCV 97.7 09/07/2022 0915   MCV 98 (H) 09/21/2020 1358   MCH 31.7 09/07/2022 0915   MCHC 32.5 09/07/2022 0915   RDW 15.5 09/07/2022 0915   RDW 12.2 09/21/2020 1358   LYMPHSABS 1.4 09/07/2022 0915   MONOABS 1.0 09/07/2022 0915   EOSABS 0.4 09/07/2022 0915   BASOSABS 0.0 09/07/2022 0915    CMP    Latest Ref Rng & Units 09/07/2022    9:15 AM 09/06/2022    5:40 AM 09/05/2022    5:56 AM  CMP  Glucose 70 - 99 mg/dL 161  85  77   BUN 8 - 23 mg/dL 18  18  15  Creatinine 0.44 - 1.00 mg/dL 8.34  1.96  2.22   Sodium 135 - 145 mmol/L 137  137  137   Potassium 3.5 - 5.1 mmol/L 4.0  4.0  4.1   Chloride 98 - 111 mmol/L 104  110  110   CO2 22 - 32 mmol/L 25  23  22    Calcium 8.9 - 10.3 mg/dL 8.7  8.6  7.9     LFTs    Latest Ref Rng & Units 09/06/2022    5:40 AM 09/05/2022    5:56 AM 09/04/2022    7:50 AM  Hepatic Function  Total Protein 6.5 - 8.1 g/dL   6.1   Albumin 3.5 - 5.0 g/dL 2.4  2.1  2.5    2.5   AST 15 - 41 U/L   208   ALT 0 - 44 U/L   220   Alk Phosphatase 38 - 126 U/L   161   Total Bilirubin 0.3 - 1.2 mg/dL   1.0   Bilirubin, Direct 0.0 - 0.2 mg/dL   0.4     Urinalysis    Component Value Date/Time   COLORURINE YELLOW 12/12/2018 2020   APPEARANCEUR CLEAR 12/12/2018 2020   LABSPEC 1.006 12/12/2018 2020   PHURINE 6.0 12/12/2018 2020   GLUCOSEU NEGATIVE 12/12/2018 2020   HGBUR NEGATIVE 12/12/2018 2020   BILIRUBINUR NEGATIVE 12/12/2018 2020   KETONESUR NEGATIVE 12/12/2018 2020   PROTEINUR NEGATIVE 12/12/2018 2020   UROBILINOGEN 0.2 04/24/2014 2257   NITRITE NEGATIVE 12/12/2018 2020   LEUKOCYTESUR NEGATIVE 12/12/2018 2020    BNP (last 3 results) Recent Labs    09/02/22 0713  BNP 212.2*      I reviewed available labs, medications, imaging, studies and related documents from the EMR.  There were no  new records/imaging since last visit. Records reviewed and summarized above.   Home Health Careg[place]  [place]  Physical Exam: GENERAL: NAD EENT:  HOH even with hearing aids LUNGS: BUL/RML coarse rhonchi cleared with cough, clear BLL, slight accessory muscle use and open mouth breathing with HOB elevated, on O2 @ 2 L CARDIAC:  S1S2, IRIR with no MRG, trace RLE ankle edema/none on left, No cyanosis ABD:  Normo-active BS x 4 quads, soft, non-tender EXTREMITIES: Normal ROM, no deformity,  No muscle atrophy/subcutaneous fat loss Skin:  right lateral shin skin tear with intact polymem dressing, right heel border foam, Duoderm to sacrum NEURO:  Noted generalized weakness or cognitive impairment PSYCH:  non-anxious affect, A & O x 3  Thank you for the opportunity to participate in the care of Jeronda Fukuhara. Please call our main office at [phone] if we can be of additional assistance.    Joycelyn Man FNP-C  [email or web address] .[facility]  Phone:  [phone]

## 2022-09-14 DIAGNOSIS — M6259 Muscle wasting and atrophy, not elsewhere classified, multiple sites: Secondary | ICD-10-CM | POA: Diagnosis not present

## 2022-09-14 DIAGNOSIS — R7401 Elevation of levels of liver transaminase levels: Secondary | ICD-10-CM | POA: Diagnosis not present

## 2022-09-14 DIAGNOSIS — J690 Pneumonitis due to inhalation of food and vomit: Secondary | ICD-10-CM | POA: Diagnosis not present

## 2022-09-14 DIAGNOSIS — R2689 Other abnormalities of gait and mobility: Secondary | ICD-10-CM | POA: Diagnosis not present

## 2022-09-14 DIAGNOSIS — K219 Gastro-esophageal reflux disease without esophagitis: Secondary | ICD-10-CM | POA: Diagnosis not present

## 2022-09-14 DIAGNOSIS — R1312 Dysphagia, oropharyngeal phase: Secondary | ICD-10-CM | POA: Diagnosis not present

## 2022-09-14 LAB — CBC AND DIFFERENTIAL
HCT: 35 — AB (ref 36–46)
Hemoglobin: 12 (ref 12.0–16.0)
WBC: 10.4

## 2022-09-14 LAB — HEPATIC FUNCTION PANEL
ALT: 65 U/L — AB (ref 7–35)
AST: 61 — AB (ref 13–35)
Alkaline Phosphatase: 223 — AB (ref 25–125)
Bilirubin, Total: 0.4

## 2022-09-14 LAB — COMPREHENSIVE METABOLIC PANEL
Albumin: 3 — AB (ref 3.5–5.0)
Calcium: 8.8 (ref 8.7–10.7)

## 2022-09-14 LAB — BASIC METABOLIC PANEL
BUN: 23 — AB (ref 4–21)
CO2: 29 — AB (ref 13–22)
Chloride: 99 (ref 99–108)
Creatinine: 1.3 — AB (ref 0.5–1.1)
Glucose: 74
Potassium: 4.6 mEq/L (ref 3.5–5.1)
Sodium: 137 (ref 137–147)

## 2022-09-14 LAB — CBC: RBC: 3.58 — AB (ref 3.87–5.11)

## 2022-09-15 DIAGNOSIS — R2689 Other abnormalities of gait and mobility: Secondary | ICD-10-CM | POA: Diagnosis not present

## 2022-09-15 DIAGNOSIS — J690 Pneumonitis due to inhalation of food and vomit: Secondary | ICD-10-CM | POA: Diagnosis not present

## 2022-09-15 DIAGNOSIS — K219 Gastro-esophageal reflux disease without esophagitis: Secondary | ICD-10-CM | POA: Diagnosis not present

## 2022-09-15 DIAGNOSIS — R1312 Dysphagia, oropharyngeal phase: Secondary | ICD-10-CM | POA: Diagnosis not present

## 2022-09-15 DIAGNOSIS — M6259 Muscle wasting and atrophy, not elsewhere classified, multiple sites: Secondary | ICD-10-CM | POA: Diagnosis not present

## 2022-09-17 ENCOUNTER — Encounter: Payer: Self-pay | Admitting: Family Medicine

## 2022-09-17 DIAGNOSIS — R1312 Dysphagia, oropharyngeal phase: Secondary | ICD-10-CM | POA: Insufficient documentation

## 2022-09-18 ENCOUNTER — Non-Acute Institutional Stay (SKILLED_NURSING_FACILITY): Payer: Medicare Other | Admitting: Adult Health

## 2022-09-18 ENCOUNTER — Encounter: Payer: Self-pay | Admitting: Adult Health

## 2022-09-18 DIAGNOSIS — R7401 Elevation of levels of liver transaminase levels: Secondary | ICD-10-CM

## 2022-09-18 DIAGNOSIS — J690 Pneumonitis due to inhalation of food and vomit: Secondary | ICD-10-CM | POA: Diagnosis not present

## 2022-09-18 DIAGNOSIS — J189 Pneumonia, unspecified organism: Secondary | ICD-10-CM

## 2022-09-18 DIAGNOSIS — K1230 Oral mucositis (ulcerative), unspecified: Secondary | ICD-10-CM

## 2022-09-18 DIAGNOSIS — I480 Paroxysmal atrial fibrillation: Secondary | ICD-10-CM

## 2022-09-18 DIAGNOSIS — K219 Gastro-esophageal reflux disease without esophagitis: Secondary | ICD-10-CM | POA: Diagnosis not present

## 2022-09-18 DIAGNOSIS — D696 Thrombocytopenia, unspecified: Secondary | ICD-10-CM | POA: Diagnosis not present

## 2022-09-18 DIAGNOSIS — J9601 Acute respiratory failure with hypoxia: Secondary | ICD-10-CM | POA: Diagnosis not present

## 2022-09-18 DIAGNOSIS — R2689 Other abnormalities of gait and mobility: Secondary | ICD-10-CM | POA: Diagnosis not present

## 2022-09-18 DIAGNOSIS — R1312 Dysphagia, oropharyngeal phase: Secondary | ICD-10-CM | POA: Diagnosis not present

## 2022-09-18 DIAGNOSIS — R062 Wheezing: Secondary | ICD-10-CM

## 2022-09-18 DIAGNOSIS — M6259 Muscle wasting and atrophy, not elsewhere classified, multiple sites: Secondary | ICD-10-CM | POA: Diagnosis not present

## 2022-09-18 MED ORDER — FUROSEMIDE 40 MG PO TABS: 40.0000 mg | ORAL_TABLET | Freq: Every day | ORAL | 0 refills | Status: DC

## 2022-09-18 MED ORDER — POTASSIUM CHLORIDE CRYS ER 20 MEQ PO TBCR: 20.0000 meq | EXTENDED_RELEASE_TABLET | Freq: Every day | ORAL | 0 refills | Status: DC

## 2022-09-18 NOTE — Progress Notes (Signed)
Location:  [facility] Room Number: 158A Place of Service:  SNF [phone]) Provider:  Fredia Beets, MD  Patient Care Team: Daisy Floro, MD as PCP - General (Family Medicine) Jake Bathe, MD as PCP - Cardiology (Cardiology)  Extended Emergency Contact Information Primary Emergency Contact: Halter,Sarah Address: [address]          [place], [place] [address] D[place] of [place] Phone: [phone] Relation: Daughter Secondary Emergency Contact: Merrily Pew Address: [address]          [place], [place] [address] D[place] of [place] Home Phone: [phone] Mobile Phone: [phone] Relation: Spouse  Code Status:  DNR Goals of care: Advanced Directive information    09/18/2022   10:11 AM  Advanced Directives  Does Patient Have a Medical Advance Directive? Yes  Type of Estate agent of [place];Living will;Out of facility DNR (pink MOST or yellow form)  Does patient want to make changes to medical advance directive? No - Patient declined     Chief Complaint  Patient presents with   Acute Visit   Immunizations    Discuss the need for Tetanus vaccine    HPI:  Pt is a 89 y.o. female seen today for an acute visit for f/u regarding cough and wheeze.  She resides currently in skilled rehab, previously lived in [place] with her husband.  PMH permanent afib, off eliquis due to GIB with cameron erosions on EGD 01/09/19, also with hypothyroidism, CKD III, CHF with HF pEF, HTN  Hospitalized 09/01/22-09/07/22 due to pna. Was treated outpatient with Augmentin and did not respond. Came to the ED and given IV antibiotics and fluids. On 4/1 had an esophgram with limited exam with possible distal esophageal stricture, and moderate to large hiatal hernia. Coughed through exam. During her stay she was taken of amiodarone due to possible s/e of transaminitis and risk due to age. Also TSh was  running low.  Also received IV lasix and discharged on oral lasix she was on previously due to pulmonary edema on xray. BNP 212.2   CXR 3/29 LLL pna and progressive interstitial prominence throughout the lung could reflect progressive scarring and superimposed reactive airway disease CXR 09/03/22 1. Small left pleural effusion and mild interstitial edema. Correlate for any signs/symptoms of CHF. 2. Decreased aeration to the left base may reflect atelectasis or airspace disease. CXR 09/06/22   IMPRESSION: Increased interstitial markings in both lungs suggest possible interstitial pulmonary edema with no significant interval change. Small bilateral pleural effusions, more so on the left side. Possibility of atelectasis/pneumonia in the lower lung fields is not excluded. Seen by ST full liquid diet and adv as tolerated, did not aspirate different consistency of liquids. Did have moderately impaired pharyngeal phase of swallow. At risk for aspiration.   Currently on regular diet chopped meat at [facility] and tolerating Seen by Dr Chales Abrahams and placed on prednisone taper 09/11/22 due to continued cough and wheeze.  Remains on oxygen 2 liters. Cough stil present but very little sputum production. Her weight is stable at 140 lbs. No edema. Continues with sob on exertion which patient and caregiver describe as mild and chronic.       Past Medical History:  Diagnosis Date   Actinic keratoses    Arthritis    back    Atrial fibrillation with RVR    Breast cancer    Breast cancer (right)   Chronic diastolic CHF (congestive heart failure)  CKD (chronic kidney disease), stage III    Compression fracture of L3 lumbar vertebra    Dysphagia    GERD (gastroesophageal reflux disease)    takes otc Pepcid as needed   Hiatal hernia 11/27/2014   HTN (hypertension)    Hypothyroidism    Insomnia    Osteopenia    PAF (paroxysmal atrial fibrillation)    a. questionable episode in 2015 in [place]. b. event  monitor through December/January 2014/2015 which showed no evidence of atrial fibrillation. c. Dx 12/2018.   PAT (paroxysmal atrial tachycardia)    Pleural effusion    Past Surgical History:  Procedure Laterality Date   BREAST LUMPECTOMY Right 1995   lumpectomy   COLONOSCOPY     ESOPHAGOGASTRODUODENOSCOPY (EGD) WITH PROPOFOL Left 01/09/2019   Procedure: ESOPHAGOGASTRODUODENOSCOPY (EGD) WITH PROPOFOL;  Surgeon: Jeani Hawking, MD;  Location: [facility];  Service: Endoscopy;  Laterality: Left;   EYE SURGERY Bilateral    cataract w/ lens implant   KYPHOPLASTY N/A 09/18/2013   Procedure: KYPHOPLASTY;  Surgeon: Emilee Hero, MD;  Location: [facility] OR;  Service: Orthopedics;  Laterality: N/A;  Lumbar 3 kyphoplasty   TONSILLECTOMY      Allergies  Allergen Reactions   Cardizem [Diltiazem]    Eliquis [Apixaban]    Tape Other (See Comments)    Patient prefers easy-release tape    Outpatient Encounter Medications as of 09/18/2022  Medication Sig   acetaminophen (TYLENOL) 500 MG tablet Take 2 tablets (1,000 mg total) by mouth every 8 (eight) hours. (Patient taking differently: Take 500 mg by mouth every 8 (eight) hours. Take 2 tablets po every 8 hours)   B Complex-C (B-COMPLEX WITH VITAMIN C) tablet Take 1 tablet by mouth daily.   benzonatate (TESSALON) 100 MG capsule Take 100 mg by mouth 3 (three) times daily as needed for cough.   Cholecalciferol (VITAMIN D3) 2000 units TABS Take 2,000 Units by mouth daily.    Ensure (ENSURE) Take 237 mLs by mouth 2 (two) times daily between meals.   furosemide (LASIX) 20 MG tablet Take 1 tablet (20 mg total) by mouth daily.   guaiFENesin (MUCINEX) 600 MG 12 hr tablet Take 600 mg by mouth in the morning.   guaiFENesin (MUCINEX) 600 MG 12 hr tablet Take 600 mg by mouth daily as needed for cough.   ipratropium-albuterol (DUONEB) 0.5-2.5 (3) MG/3ML SOLN Take 3 mLs by nebulization 2 (two) times daily as needed (Wheezing/SOB).   ipratropium-albuterol (DUONEB)  0.5-2.5 (3) MG/3ML SOLN Take 3 mLs by nebulization 4 (four) times daily.   levothyroxine (SYNTHROID) 112 MCG tablet Take 112 mcg by mouth every morning.   melatonin 5 MG TABS Take 5 mg by mouth at bedtime as needed (Sleep).   metoprolol tartrate (LOPRESSOR) 25 MG tablet Take 0.5 tablets (12.5 mg total) by mouth 2 (two) times daily.   nitroGLYCERIN (NITROSTAT) 0.4 MG SL tablet Place 1 tablet (0.4 mg total) under the tongue every 5 (five) minutes as needed for chest pain.   ondansetron (ZOFRAN) 4 MG tablet Take 4 mg by mouth every 6 (six) hours as needed for nausea or vomiting.   pantoprazole (PROTONIX) 40 MG tablet Take 1 tablet (40 mg total) by mouth daily.   polyethylene glycol powder (GLYCOLAX/MIRALAX) 17 GM/SCOOP powder Take 17 g by mouth daily.   predniSONE (DELTASONE) 10 MG tablet Take 10 mg by mouth daily with breakfast.   Probiotic Product (ALIGN) 4 MG CAPS Take 4 mg by mouth daily.   sennosides-docusate sodium (SENOKOT-S) 8.6-50 MG  tablet Take 1 tablet by mouth at bedtime as needed for constipation.   albuterol (PROVENTIL) (2.5 MG/3ML) 0.083% nebulizer solution Take 2.5 mg by nebulization every 6 (six) hours as needed for wheezing or shortness of breath. (Patient not taking: Reported on 09/18/2022)   Facility-Administered Encounter Medications as of 09/18/2022  Medication   bisacodyl (DULCOLAX) suppository 10 mg    Review of Systems  Constitutional:  Negative for activity change, appetite change, chills, diaphoresis, fatigue, fever and unexpected weight change.  HENT:  Negative for congestion.   Respiratory:  Positive for choking, shortness of breath and wheezing. Negative for cough.   Cardiovascular:  Negative for chest pain, palpitations and leg swelling.  Gastrointestinal:  Negative for abdominal distention, abdominal pain, constipation and diarrhea.  Genitourinary:  Negative for difficulty urinating and dysuria.  Musculoskeletal:  Positive for gait problem. Negative for  arthralgias, back pain, joint swelling and myalgias.  Neurological:  Negative for dizziness, tremors, seizures, syncope, facial asymmetry, speech difficulty, weakness, light-headedness, numbness and headaches.  Psychiatric/Behavioral:  Negative for agitation, behavioral problems and confusion.     Immunization History  Administered Date(s) Administered   Covid-19, Mrna,Vaccine(Spikevax)29yrs and older 04/10/2022   Moderna SARS-COV2 Booster Vaccination 10/05/2021   Moderna Sars-Covid-2 Vaccination 07/18/2019, 04/28/2020   PNEUMOCOCCAL CONJUGATE-20 08/09/2021   Tdap 08/11/2011   Zoster Recombinat (Shingrix) 11/18/2020, 03/10/2022   Pertinent  Health Maintenance Due  Topic Date Due   INFLUENZA VACCINE  01/04/2023   DEXA SCAN  Completed      01/08/2019    2:37 PM 01/08/2019    9:28 PM 01/09/2019    8:23 AM 01/09/2019   10:26 PM 01/10/2019   11:08 AM  Fall Risk  (RETIRED) Patient Fall Risk Level Low fall risk High fall risk High fall risk High fall risk High fall risk   Functional Status Survey:    Vitals:   09/18/22 1000  BP: (!) 152/79  Pulse: 66  Resp: 16  Temp: (!) 97.5 F (36.4 C)  TempSrc: Temporal  SpO2: 97%  Weight: 141 lb (64 kg)  Height: 5\' 3"  (1.6 m)   Body mass index is 24.98 kg/m. Physical Exam  Labs reviewed: Recent Labs    09/04/22 0750 09/05/22 0556 09/06/22 0540 09/07/22 0915  NA 134* 137 137 137  K 3.8 4.1 4.0 4.0  CL 106 110 110 104  CO2 22 22 23 25   GLUCOSE 75 77 85 101*  BUN 14 15 18 18   CREATININE 1.15* 1.16* 1.26* 1.26*  CALCIUM 8.4* 7.9* 8.6* 8.7*  PHOS 2.5 3.4 3.0  --    Recent Labs    09/02/22 1137 09/03/22 0642 09/04/22 0750 09/05/22 0556 09/06/22 0540  AST 329* 239* 208*  --   --   ALT 286* 240* 220*  --   --   ALKPHOS 166* 151* 161*  --   --   BILITOT 0.8 0.9 1.0  --   --   PROT 6.4* 5.5* 6.1*  --   --   ALBUMIN 2.9* 2.3* 2.5*  2.5* 2.1* 2.4*   Recent Labs    09/01/22 2037 09/02/22 0713 09/03/22 0642 09/07/22 0915   WBC 7.5 8.7 7.8 8.3  NEUTROABS 5.1  --  5.2 5.3  HGB 12.6 12.1 11.8* 12.6  HCT 38.4 36.1 35.0* 38.8  MCV 96.2 95.0 95.6 97.7  PLT 173 194 PLATELET CLUMPS NOTED ON SMEAR, UNABLE TO ESTIMATE 144*   Lab Results  Component Value Date   TSH 0.067 (L) 09/02/2022   No results  found for: "HGBA1C" Lab Results  Component Value Date   CHOL 128 12/13/2018   HDL 79 12/13/2018   LDLCALC 36 12/13/2018   TRIG 63 12/13/2018   CHOLHDL 1.6 12/13/2018    Significant Diagnostic Results in last 30 days:  ECHOCARDIOGRAM COMPLETE  Result Date: 09/07/2022    ECHOCARDIOGRAM REPORT   Patient Name:   JANELIE GOLTZ Date of Exam: 09/07/2022 Medical Rec #:  [id number]        Height:       63.8 in Accession #:    [id number]       Weight:       142.0 lb Date of Birth:  August 18, 1922       BSA:          1.688 m Patient Age:    89 years         BP:           137/67 mmHg Patient Gender: F                HR:           69 bpm. Exam Location:  Inpatient Procedure: 2D Echo, Cardiac Doppler and Color Doppler Indications:    I50.21 Acute systolic (congestive) heart failure  History:        Patient has prior history of Echocardiogram examinations. CHF;                 Risk Factors:Hypertension.  Sonographer:    Mike Gip Referring Phys: [id number] RAVI PAHWANI IMPRESSIONS  1. Left ventricular ejection fraction, by estimation, is 70 to 75%. The left ventricle has hyperdynamic function. The left ventricle has no regional wall motion abnormalities. Left ventricular diastolic parameters are consistent with Grade II diastolic dysfunction (pseudonormalization). Elevated left ventricular end-diastolic pressure.  2. Right ventricular systolic function is normal. The right ventricular size is normal. There is moderately elevated pulmonary artery systolic pressure.  3. Left atrial size was severely dilated.  4. Right atrial size was severely dilated.  5. The mitral valve is degenerative. Trivial mitral valve regurgitation. No evidence of mitral  stenosis. Moderate mitral annular calcification.  6. The aortic valve is tricuspid. Aortic valve regurgitation is not visualized. No aortic stenosis is present.  7. The inferior vena cava is normal in size with greater than 50% respiratory variability, suggesting right atrial pressure of 3 mmHg. FINDINGS  Left Ventricle: Left ventricular ejection fraction, by estimation, is 70 to 75%. The left ventricle has hyperdynamic function. The left ventricle has no regional wall motion abnormalities. The left ventricular internal cavity size was normal in size. There is no left ventricular hypertrophy. Left ventricular diastolic parameters are consistent with Grade II diastolic dysfunction (pseudonormalization). Elevated left ventricular end-diastolic pressure. Right Ventricle: The right ventricular size is normal. No increase in right ventricular wall thickness. Right ventricular systolic function is normal. There is moderately elevated pulmonary artery systolic pressure. The tricuspid regurgitant velocity is 3.34 m/s, and with an assumed right atrial pressure of 3 mmHg, the estimated right ventricular systolic pressure is 47.6 mmHg. Left Atrium: Left atrial size was severely dilated. Right Atrium: Right atrial size was severely dilated. Pericardium: There is no evidence of pericardial effusion. Mitral Valve: The mitral valve is degenerative in appearance. Moderate mitral annular calcification. Trivial mitral valve regurgitation. No evidence of mitral valve stenosis. Tricuspid Valve: The tricuspid valve is normal in structure. Tricuspid valve regurgitation is mild . No evidence of tricuspid stenosis. Aortic Valve: The aortic valve is tricuspid. Aortic valve  regurgitation is not visualized. No aortic stenosis is present. Pulmonic Valve: The pulmonic valve was normal in structure. Pulmonic valve regurgitation is mild. No evidence of pulmonic stenosis. Aorta: The aortic root is normal in size and structure. Venous: The inferior  vena cava is normal in size with greater than 50% respiratory variability, suggesting right atrial pressure of 3 mmHg. IAS/Shunts: No atrial level shunt detected by color flow Doppler.  LEFT VENTRICLE PLAX 2D LVIDd:         2.90 cm     Diastology LVIDs:         1.60 cm     LV e' medial:    4.60 cm/s LV PW:         1.00 cm     LV E/e' medial:  30.7 LV IVS:        1.00 cm     LV e' lateral:   7.23 cm/s LVOT diam:     1.70 cm     LV E/e' lateral: 19.5 LV SV:         73 LV SV Index:   43 LVOT Area:     2.27 cm  LV Volumes (MOD) LV vol d, MOD A2C: 72.6 ml LV vol d, MOD A4C: 61.5 ml LV vol s, MOD A2C: 21.1 ml LV vol s, MOD A4C: 17.7 ml LV SV MOD A2C:     51.5 ml LV SV MOD A4C:     61.5 ml LV SV MOD BP:      50.5 ml RIGHT VENTRICLE RV Basal diam:  3.80 cm RV S prime:     15.50 cm/s TAPSE (M-mode): 2.6 cm LEFT ATRIUM             Index        RIGHT ATRIUM           Index LA diam:        3.50 cm 2.07 cm/m   RA Area:     14.40 cm LA Vol (A2C):   62.6 ml 37.06 ml/m  RA Volume:   29.50 ml  17.48 ml/m LA Vol (A4C):   87.0 ml 51.55 ml/m LA Biplane Vol: 81.9 ml 48.53 ml/m  AORTIC VALVE LVOT Vmax:   129.00 cm/s LVOT Vmean:  93.100 cm/s LVOT VTI:    0.323 m  AORTA Ao Root diam: 2.90 cm Ao Asc diam:  3.10 cm MITRAL VALVE                TRICUSPID VALVE MV Area (PHT): 3.03 cm     TR Peak grad:   44.6 mmHg MV Decel Time: 250 msec     TR Vmax:        334.00 cm/s MV E velocity: 141.00 cm/s MV A velocity: 78.30 cm/s   SHUNTS MV E/A ratio:  1.80         Systemic VTI:  0.32 m                             Systemic Diam: 1.70 cm Chilton Si MD Electronically signed by Chilton Si MD Signature Date/Time: 09/07/2022/12:37:46 PM    Final    DG Chest 2 View  Result Date: 09/06/2022 CLINICAL DATA:  Pneumonia EXAM: CHEST - 2 VIEW COMPARISON:  Previous studies including the examination of 09/03/2022 FINDINGS: Transverse diameter of heart is increased. Central pulmonary vessels are prominent. Increased interstitial markings are seen  in both parahilar regions and both lower lung  fields. Small bilateral pleural effusions are seen. There is no pneumothorax. No significant interval changes are noted. There is soft tissue fullness in retrocardiac region suggesting possible fixed hiatal hernia. IMPRESSION: Increased interstitial markings in both lungs suggest possible interstitial pulmonary edema with no significant interval change. Small bilateral pleural effusions, more so on the left side. Possibility of atelectasis/pneumonia in the lower lung fields is not excluded. Electronically Signed   By: Ernie Avena M.D.   On: 09/06/2022 12:08   DG Swallowing Func-Speech Pathology  Result Date: 09/04/2022 Table formatting from the original result was not included. Modified Barium Swallow Study Patient Details Name: SENIAH LAWRENCE MRN: [id number] Date of Birth: Jan 08, 1923 Today's Date: 09/04/2022 HPI/PMH: HPI: Patient is a 89 year old female admitted to [facility] hospital with left lower lobe pneumonia, hyponatremia.  Patient has history of GERD, breast cancer, dysphagia, hiatal hernia.  She was treated for pneumonia several days prior to admission but did not improve.  Prior esophagram in 2016 showed hiatal hernia as well as traction diverticulum below the carina, normal motility, flash penetration of 50 liquid.  Her GI doctor at that time did not advise endoscopy.  Swallow evaluation ordered by Dr. Mahala Menghini.  CXR 09/03/2022 shows  Small left pleural effusion and mild interstitial edema.  Correlate for any signs/symptoms of CHF.  2. Decreased aeration to the left base may reflect atelectasis or  airspace disease. Clinical Impression: Clinical Impression: Patient was initially reluctant but particiapted fully in this MBS. SLP assessed her swallow with the following consistencies of barium: thin, nectar thick, puree. Full MBSimp protocol not followed secondary to patient's willingness and ability to participate. She exhibits a moderately impaired  oral and mild-moderately impaired pharyngeal phase of swallow characterized by incomplete epiglottic inversion, weak tongue base retraction and reduced PES opening. i n addition, she appeared with xerostomia of oral cavity. These impairments led to prolonged anterior to posterior transit of bolus in oral cavity, mild and at times mild-moderate amount of barium residuals at base of tongue and vallecular sinus. PES opening appeared improved with larger bolus size (ie cup or straw sip as opposed to spoon). Although only partial epiglottic inversion was observed with initial spoon sip of thin liquids, full inversion was observed with all subsequent swallows.  No penetration or aspiration observed with any of the tested consistencies. Patient did have incidents of wet sounding cough, but this was in the absence of any barium in laryngeal vestibule and was likely due to secretions. SLP recommending continue with full liquids, however plan to assess her toleration of advanced solids at bedside. Factors that may increase risk of adverse event in presence of aspiration Rubye Oaks & Clearance Coots 2021): Factors that may increase risk of adverse event in presence of aspiration Rubye Oaks & Clearance Coots 2021): Poor general health and/or compromised immunity; Frail or deconditioned; Weak cough; Reduced saliva Recommendations/Plan: Swallowing Evaluation Recommendations Swallowing Evaluation Recommendations Recommendations: PO diet PO Diet Recommendation: Full liquid diet; Thin liquids (Level 0) Liquid Administration via: Cup; Straw Medication Administration: Whole meds with puree Supervision: Full supervision/cueing for swallowing strategies; Full assist for feeding Swallowing strategies  : Slow rate; Small bites/sips; Check for pocketing or oral holding Postural changes: Position pt fully upright for meals; Stay upright 30-60 min after meals Oral care recommendations: Oral care BID (2x/day); Staff/trained caregiver to provide oral care Treatment  Plan Treatment Plan Treatment recommendations: Therapy as outlined in treatment plan below Follow-up recommendations: Follow physicians's recommendations for discharge plan and follow up therapies Functional status assessment: Patient has  had a recent decline in their functional status and demonstrates the ability to make significant improvements in function in a reasonable and predictable amount of time. Treatment frequency: Min 2x/week Treatment duration: 1 week Interventions: Compensatory techniques; Patient/family education; Trials of upgraded texture/liquids; Diet toleration management by SLP Recommendations Recommendations for follow up therapy are one component of a multi-disciplinary discharge planning process, led by the attending physician.  Recommendations may be updated based on patient status, additional functional criteria and insurance authorization. Assessment: Orofacial Exam: Orofacial Exam Oral Cavity: Oral Hygiene: Xerostomia Oral Cavity - Dentition: Adequate natural dentition Orofacial Anatomy: WFL Anatomy: Anatomy: Prominent cricopharyngeus; Suspected cervical osteophytes Boluses Administered: Boluses Administered Boluses Administered: Thin liquids (Level 0); Mildly thick liquids (Level 2, nectar thick); Puree  Oral Impairment Domain: Oral Impairment Domain Lip Closure: Escape from interlabial space or lateral juncture, no extension beyond vermillion border Tongue control during bolus hold: Not tested Bolus transport/lingual motion: Slow tongue motion Oral residue: Trace residue lining oral structures Location of oral residue : Tongue; Palate Initiation of pharyngeal swallow : Valleculae  Pharyngeal Impairment Domain: Pharyngeal Impairment Domain Soft palate elevation: No bolus between soft palate (SP)/pharyngeal wall (PW) Laryngeal elevation: Complete superior movement of thyroid cartilage with complete approximation of arytenoids to epiglottic petiole Anterior hyoid excursion: Complete  anterior movement Epiglottic movement: Partial inversion Laryngeal vestibule closure: Complete, no air/contrast in laryngeal vestibule Pharyngeal stripping wave : Present - complete Pharyngeal contraction (A/P view only): N/A Pharyngoesophageal segment opening: Partial distention/partial duration, partial obstruction of flow Tongue base retraction: Trace column of contrast or air between tongue base and PPW Pharyngeal residue: Collection of residue within or on pharyngeal structures Location of pharyngeal residue: Valleculae; Tongue base  Esophageal Impairment Domain: Esophageal Impairment Domain Esophageal clearance upright position: Esophageal retention Pill: Esophageal Impairment Domain Esophageal clearance upright position: Esophageal retention Penetration/Aspiration Scale Score: Penetration/Aspiration Scale Score 1.  Material does not enter airway: Thin liquids (Level 0); Mildly thick liquids (Level 2, nectar thick); Puree Compensatory Strategies: Compensatory Strategies Compensatory strategies: Yes Straw: Effective Effective Straw: Thin liquid (Level 0)   General Information: Caregiver present: No  Diet Prior to this Study: Full liquid diet   Temperature : Normal   Respiratory Status: WFL   Supplemental O2: None (Room air)   History of Recent Intubation: No  Behavior/Cognition: Alert; Cooperative Self-Feeding Abilities: Needs assist with self-feeding Baseline vocal quality/speech: Hypophonia/low volume No data recorded Volitional Swallow: Able to elicit Exam Limitations: No limitations Goal Planning: Prognosis for improved oropharyngeal function: Fair Barriers to Reach Goals: Severity of deficits; Behavior No data recorded Patient/Family Stated Goal: pt states "I'm dying" than says "I don't want to feel this way" Consulted and agree with results and recommendations: Pt unable/family or caregiver not available Pain: Pain Assessment Pain Assessment: No/denies pain End of Session: Start Time:SLP Start Time (ACUTE  ONLY): 1110 Stop Time: SLP Stop Time (ACUTE ONLY): 1130 Time Calculation:SLP Time Calculation (min) (ACUTE ONLY): 20 min Charges: SLP Evaluations $ SLP Speech Visit: 1 Visit SLP Evaluations $BSS Swallow: 1 Procedure $MBS Swallow: 1 Procedure SLP visit diagnosis: SLP Visit Diagnosis: Dysphagia, oropharyngeal phase (R13.12) Past Medical History: Past Medical History: Diagnosis Date  Actinic keratoses   Arthritis   back   Breast cancer (HCC)   Breast cancer (right)  Chronic diastolic CHF (congestive heart failure) (HCC)   CKD (chronic kidney disease), stage III (HCC)   Compression fracture of L3 lumbar vertebra   Dysphagia   GERD (gastroesophageal reflux disease)   takes otc Pepcid as needed  Hiatal hernia 11/27/2014  HTN (hypertension)   Hypothyroidism   Insomnia   Osteopenia   PAF (paroxysmal atrial fibrillation) (HCC)   a. questionable episode in 2015 in [facility]. b. event monitor through December/January 2014/2015 which showed no evidence of atrial fibrillation. c. Dx 12/2018.  PAT (paroxysmal atrial tachycardia)   Pleural effusion  Past Surgical History: Past Surgical History: Procedure Laterality Date  BREAST LUMPECTOMY Right 1995  lumpectomy  COLONOSCOPY    ESOPHAGOGASTRODUODENOSCOPY (EGD) WITH PROPOFOL Left 01/09/2019  Procedure: ESOPHAGOGASTRODUODENOSCOPY (EGD) WITH PROPOFOL;  Surgeon: Jeani Hawking, MD;  Location: [facility];  Service: Endoscopy;  Laterality: Left;  EYE SURGERY Bilateral   cataract w/ lens implant  KYPHOPLASTY N/A 09/18/2013  Procedure: KYPHOPLASTY;  Surgeon: Emilee Hero, MD;  Location: [facility] OR;  Service: Orthopedics;  Laterality: N/A;  Lumbar 3 kyphoplasty  TONSILLECTOMY   Angela Nevin, MA, CCC-SLP Speech Therapy   DG ESOPHAGUS W SINGLE CM (SOL OR THIN BA)  Result Date: 09/04/2022 CLINICAL DATA:  Coughing after eating and drinking. Evaluate for an esophageal stricture. EXAM: ESOPHOGRAM/BARIUM SWALLOW TECHNIQUE: Single contrast examination was performed using  thin barium. FLUOROSCOPY:  Radiation Exposure Index (as provided by the fluoroscopic device): 4.6 mGy Kerma COMPARISON:  None. FINDINGS: A very limited examination was performed in the recumbent LPO position, due to patient condition. Patient was only able to drink very minimal contrast with coughing after each swallow. Poor esophageal motility with tertiary contractions. Suspect a distal esophageal stricture. Study was terminated due to excessive coughing and patient condition. Moderate to large hiatal hernia. IMPRESSION: 1. Very limited examination was performed due to patient condition, as detailed above. 2. Suspect a distal esophageal stricture. Tablet was not administered due to patient condition. 3. Esophageal dysmotility. 4. Moderate to large hiatal hernia. Electronically Signed   By: Leanna Battles M.D.   On: 09/04/2022 12:01   DG Chest 2 View  Result Date: 09/03/2022 CLINICAL DATA:  Evaluate pneumonia. EXAM: CHEST - 2 VIEW COMPARISON:  09/01/22 FINDINGS: Stable cardiomediastinal contours. Lung volumes are low with mild asymmetric elevation of the right hemidiaphragm. Increase interstitial markings are identified bilaterally concerning for mild interstitial edema. Small left pleural effusion identified. Decreased aeration to the left base may reflect atelectasis or airspace disease. IMPRESSION: 1. Small left pleural effusion and mild interstitial edema. Correlate for any signs/symptoms of CHF. 2. Decreased aeration to the left base may reflect atelectasis or airspace disease. Electronically Signed   By: Signa Kell M.D.   On: 09/03/2022 17:16   US Abdomen Limited RUQ (LIVER/GB)  Result Date: 09/02/2022 CLINICAL DATA:  Elevated LFTs EXAM: ULTRASOUND ABDOMEN LIMITED RIGHT UPPER QUADRANT COMPARISON:  12/11/2014 FINDINGS: Gallbladder: No gallstones or wall thickening visualized. No sonographic Murphy sign noted by sonographer. Common bile duct: Diameter: 2.9 mm Liver: Mildly increased in echogenicity without evidence of focal  mass. Portal vein is patent on color Doppler imaging with normal direction of blood flow towards the liver. Other: None. IMPRESSION: Fatty infiltration of the liver. No other focal abnormality is noted. Electronically Signed   By: Alcide Clever M.D.   On: 09/02/2022 02:53   DG Chest Port 1 View  Result Date: 09/01/2022 CLINICAL DATA:  Short of breath, pneumonia EXAM: PORTABLE CHEST 1 VIEW COMPARISON:  01/08/2019 FINDINGS: Single frontal view of the chest demonstrates a stable cardiac silhouette. There is diffuse interstitial prominence throughout the lungs, with slight progression since prior study. Increased density at the left lateral lung base with loss of the margins of the left hemidiaphragm  could reflect left lower lobe pneumonia. No large effusion or pneumothorax. No acute bony abnormality. IMPRESSION: 1. Increased density left lateral lung base which could reflect left lower lobe pneumonia. 2. Progressive interstitial prominence throughout the lungs, which could reflect progressive scarring or superimposed reactive airway disease. Electronically Signed   By: Sharlet Salina M.D.   On: 09/01/2022 20:30    Assessment/Plan  1. Wheezing ?if she has any underlying CHF also has hx of reactive airway disease Will increase lasix for 3 days to 40 mg and Kdur 20 meq qd x 3 d Keep duonebs, continue prednisone.  Consider inhaled steroid if not improving. Consider repeat xray if not improving.   2. HCAP (healthcare-associated pneumonia) vs aspiration  Finished cefidinir Continues to have wheezing and prednisone may need more time to work Continue Duonebs QID   3. Acute respiratory failure with hypoxia Still on 2 liters of oxygen   4. Mucositis Improved with magic mouthwash  5. Thrombocytopenia Needs repeat CBC  6. Transaminitis Improving ?due to amiodarone which has been discontinued.   7. Paroxysmal atrial fibrillation Rate is controlled on metoprolol Not on DOAC due to GIB  hx   Family/ staff Communication: nurse  Labs/tests ordered:  repeating CBC due to plt count not calculated

## 2022-09-19 DIAGNOSIS — K219 Gastro-esophageal reflux disease without esophagitis: Secondary | ICD-10-CM | POA: Diagnosis not present

## 2022-09-19 DIAGNOSIS — R1312 Dysphagia, oropharyngeal phase: Secondary | ICD-10-CM | POA: Diagnosis not present

## 2022-09-19 DIAGNOSIS — R2689 Other abnormalities of gait and mobility: Secondary | ICD-10-CM | POA: Diagnosis not present

## 2022-09-19 DIAGNOSIS — D649 Anemia, unspecified: Secondary | ICD-10-CM | POA: Diagnosis not present

## 2022-09-19 DIAGNOSIS — J690 Pneumonitis due to inhalation of food and vomit: Secondary | ICD-10-CM | POA: Diagnosis not present

## 2022-09-19 DIAGNOSIS — M6259 Muscle wasting and atrophy, not elsewhere classified, multiple sites: Secondary | ICD-10-CM | POA: Diagnosis not present

## 2022-09-19 LAB — CBC AND DIFFERENTIAL
HCT: 36 (ref 36–46)
Hemoglobin: 12.2 (ref 12.0–16.0)
Platelets: 130 10*3/uL — AB (ref 150–400)
WBC: 12

## 2022-09-19 LAB — CBC: RBC: 3.71 — AB (ref 3.87–5.11)

## 2022-09-20 DIAGNOSIS — R2689 Other abnormalities of gait and mobility: Secondary | ICD-10-CM | POA: Diagnosis not present

## 2022-09-20 DIAGNOSIS — K219 Gastro-esophageal reflux disease without esophagitis: Secondary | ICD-10-CM | POA: Diagnosis not present

## 2022-09-20 DIAGNOSIS — M6259 Muscle wasting and atrophy, not elsewhere classified, multiple sites: Secondary | ICD-10-CM | POA: Diagnosis not present

## 2022-09-20 DIAGNOSIS — R1312 Dysphagia, oropharyngeal phase: Secondary | ICD-10-CM | POA: Diagnosis not present

## 2022-09-20 DIAGNOSIS — J690 Pneumonitis due to inhalation of food and vomit: Secondary | ICD-10-CM | POA: Diagnosis not present

## 2022-09-21 ENCOUNTER — Encounter: Payer: Self-pay | Admitting: Adult Health

## 2022-09-21 ENCOUNTER — Non-Acute Institutional Stay (SKILLED_NURSING_FACILITY): Payer: Medicare Other | Admitting: Adult Health

## 2022-09-21 DIAGNOSIS — I5032 Chronic diastolic (congestive) heart failure: Secondary | ICD-10-CM

## 2022-09-21 DIAGNOSIS — J690 Pneumonitis due to inhalation of food and vomit: Secondary | ICD-10-CM | POA: Diagnosis not present

## 2022-09-21 DIAGNOSIS — J189 Pneumonia, unspecified organism: Secondary | ICD-10-CM | POA: Diagnosis not present

## 2022-09-21 DIAGNOSIS — K219 Gastro-esophageal reflux disease without esophagitis: Secondary | ICD-10-CM | POA: Diagnosis not present

## 2022-09-21 DIAGNOSIS — J4521 Mild intermittent asthma with (acute) exacerbation: Secondary | ICD-10-CM

## 2022-09-21 DIAGNOSIS — R7401 Elevation of levels of liver transaminase levels: Secondary | ICD-10-CM

## 2022-09-21 DIAGNOSIS — E039 Hypothyroidism, unspecified: Secondary | ICD-10-CM

## 2022-09-21 DIAGNOSIS — R1312 Dysphagia, oropharyngeal phase: Secondary | ICD-10-CM | POA: Diagnosis not present

## 2022-09-21 DIAGNOSIS — R2689 Other abnormalities of gait and mobility: Secondary | ICD-10-CM | POA: Diagnosis not present

## 2022-09-21 DIAGNOSIS — M6259 Muscle wasting and atrophy, not elsewhere classified, multiple sites: Secondary | ICD-10-CM | POA: Diagnosis not present

## 2022-09-21 DIAGNOSIS — D696 Thrombocytopenia, unspecified: Secondary | ICD-10-CM

## 2022-09-21 DIAGNOSIS — R1313 Dysphagia, pharyngeal phase: Secondary | ICD-10-CM | POA: Diagnosis not present

## 2022-09-21 MED ORDER — FUROSEMIDE 20 MG PO TABS: 20.0000 mg | ORAL_TABLET | Freq: Every day | ORAL | 0 refills | Status: DC

## 2022-09-21 MED ORDER — PREDNISONE 10 MG PO TABS: 10.0000 mg | ORAL_TABLET | Freq: Every day | ORAL | 0 refills | Status: AC

## 2022-09-21 NOTE — Progress Notes (Signed)
Location:  Medical illustrator of Service:  SNF (31) Provider:  Fredia Beets, MD  Patient Care Team: Daisy Floro, MD as PCP - General (Family Medicine) Jake Bathe, MD as PCP - Cardiology (Cardiology)  Extended Emergency Contact Information Primary Emergency Contact: Cassandra Ray Address: [address]          [place], [place] [address] Darden Amber of [place] Phone: [phone] Relation: Daughter Secondary Emergency Contact: Cassandra Ray Address: [address]          [place], [place] [address] Darden Amber of [place] Home Phone: [phone] Mobile Phone: [phone] Relation: Spouse  Code Status:  DNR Goals of care: Advanced Directive information    09/18/2022   10:11 AM  Advanced Directives  Does Patient Have a Medical Advance Directive? Yes  Type of Estate agent of [place];Living will;Out of facility DNR (pink MOST or yellow form)  Does patient want to make changes to medical advance directive? No - Patient declined     Chief Complaint  Patient presents with   Acute Visit    F/u pna, wheezing.     HPI:  Pt is a 89 y.o. female seen today for an acute visit for f/u regarding cough and wheeze.  She resides currently in skilled rehab, previously lived in [place] with her husband.  PMH permanent afib, off eliquis due to GIB with cameron erosions on EGD 01/09/19, also with hypothyroidism, CKD III, CHF with HF pEF, HTN  Hospitalized 09/01/22-09/07/22 due to pna. Was treated outpatient with Augmentin and did not respond. Came to the ED and given IV antibiotics and fluids. On 4/1 had an esophgram with limited exam with possible distal esophageal stricture, and moderate to large hiatal hernia. Coughed through exam. During her stay she was taken of amiodarone due to possible s/e of transaminitis and risk due to age. Also TSh was running low.  Also received IV lasix and discharged on oral  lasix she was on previously due to pulmonary edema on xray. BNP 212.2   CXR 3/29 LLL pna and progressive interstitial prominence throughout the lung could reflect progressive scarring and superimposed reactive airway disease CXR 09/03/22 1. Small left pleural effusion and mild interstitial edema. Correlate for any signs/symptoms of CHF. 2. Decreased aeration to the left base may reflect atelectasis or airspace disease. CXR 09/06/22   IMPRESSION: Increased interstitial markings in both lungs suggest possible interstitial pulmonary edema with no significant interval change. Small bilateral pleural effusions, more so on the left side. Possibility of atelectasis/pneumonia in the lower lung fields is not excluded. Seen by ST full liquid diet and adv as tolerated, did not aspirate different consistency of liquids. Did have moderately impaired pharyngeal phase of swallow. At risk for aspiration.   Currently on regular diet chopped meat at [facility] and tolerating Seen by Dr Chales Abrahams and placed on prednisone taper 09/11/22 due to continued cough and wheeze.   On 09/18/22 I saw her and increased lasix for 3 days due to continued oxygen requirement and wheezing.   F/U 09/21/22: Nurse did taper her off oxygen but the patient wanted to put it back on because it made her feel better. She is not having any sob. Less cough. Not really exerting though.  Has a hx of "reactive airway disease" was on symbicort at one point. I don;t see PFTs. Weight went down by 3 lbs then back up by 2.5 lbs after increased lasix dosing. Pt is not progressing lacks  motivation, wants to sleep in.  Has personal care giver.       Past Medical History:  Diagnosis Date   Actinic keratoses    Arthritis    back    Atrial fibrillation with RVR    Breast cancer    Breast cancer (right)   Chronic diastolic CHF (congestive heart failure)    CKD (chronic kidney disease), stage III    Compression fracture of L3 lumbar vertebra     Dysphagia    GERD (gastroesophageal reflux disease)    takes otc Pepcid as needed   Hiatal hernia 11/27/2014   HTN (hypertension)    Hypothyroidism    Insomnia    Osteopenia    PAF (paroxysmal atrial fibrillation)    a. questionable episode in 2015 in [place]. b. event monitor through December/January 2014/2015 which showed no evidence of atrial fibrillation. c. Dx 12/2018.   PAT (paroxysmal atrial tachycardia)    Pleural effusion    Past Surgical History:  Procedure Laterality Date   BREAST LUMPECTOMY Right 1995   lumpectomy   COLONOSCOPY     ESOPHAGOGASTRODUODENOSCOPY (EGD) WITH PROPOFOL Left 01/09/2019   Procedure: ESOPHAGOGASTRODUODENOSCOPY (EGD) WITH PROPOFOL;  Surgeon: Jeani Hawking, MD;  Location: [facility];  Service: Endoscopy;  Laterality: Left;   EYE SURGERY Bilateral    cataract w/ lens implant   KYPHOPLASTY N/A 09/18/2013   Procedure: KYPHOPLASTY;  Surgeon: Emilee Hero, MD;  Location: [facility] OR;  Service: Orthopedics;  Laterality: N/A;  Lumbar 3 kyphoplasty   TONSILLECTOMY      Allergies  Allergen Reactions   Cardizem [Diltiazem]    Eliquis [Apixaban]    Tape Other (See Comments)    Patient prefers easy-release tape    Outpatient Encounter Medications as of 09/21/2022  Medication Sig   furosemide (LASIX) 20 MG tablet Take 1 tablet (20 mg total) by mouth daily.   predniSONE (DELTASONE) 10 MG tablet Take 1 tablet (10 mg total) by mouth daily with breakfast for 5 days. 10 mg daily for 2 days 5 mg daily for 2 days then d/c   acetaminophen (TYLENOL) 500 MG tablet Take 2 tablets (1,000 mg total) by mouth every 8 (eight) hours. (Patient taking differently: Take 500 mg by mouth every 8 (eight) hours. Take 2 tablets po every 8 hours)   B Complex-C (B-COMPLEX WITH VITAMIN C) tablet Take 1 tablet by mouth daily.   benzonatate (TESSALON) 100 MG capsule Take 100 mg by mouth 3 (three) times daily as needed for cough.   Cholecalciferol (VITAMIN D3) 2000 units TABS Take 2,000  Units by mouth daily.    Ensure (ENSURE) Take 237 mLs by mouth 2 (two) times daily between meals.   guaiFENesin (MUCINEX) 600 MG 12 hr tablet Take 600 mg by mouth in the morning.   guaiFENesin (MUCINEX) 600 MG 12 hr tablet Take 600 mg by mouth daily as needed for cough.   ipratropium-albuterol (DUONEB) 0.5-2.5 (3) MG/3ML SOLN Take 3 mLs by nebulization 2 (two) times daily as needed (Wheezing/SOB).   ipratropium-albuterol (DUONEB) 0.5-2.5 (3) MG/3ML SOLN Take 3 mLs by nebulization 4 (four) times daily.   levothyroxine (SYNTHROID) 112 MCG tablet Take 112 mcg by mouth every morning.   melatonin 5 MG TABS Take 5 mg by mouth at bedtime as needed (Sleep).   metoprolol tartrate (LOPRESSOR) 25 MG tablet Take 0.5 tablets (12.5 mg total) by mouth 2 (two) times daily.   nitroGLYCERIN (NITROSTAT) 0.4 MG SL tablet Place 1 tablet (0.4 mg total) under the tongue  every 5 (five) minutes as needed for chest pain.   ondansetron (ZOFRAN) 4 MG tablet Take 4 mg by mouth every 6 (six) hours as needed for nausea or vomiting.   pantoprazole (PROTONIX) 40 MG tablet Take 1 tablet (40 mg total) by mouth daily.   polyethylene glycol powder (GLYCOLAX/MIRALAX) 17 GM/SCOOP powder Take 17 g by mouth daily.   Probiotic Product (ALIGN) 4 MG CAPS Take 4 mg by mouth daily.   sennosides-docusate sodium (SENOKOT-S) 8.6-50 MG tablet Take 1 tablet by mouth at bedtime as needed for constipation.   [DISCONTINUED] furosemide (LASIX) 40 MG tablet Take 1 tablet (40 mg total) by mouth daily for 3 days.   [DISCONTINUED] potassium chloride SA (KLOR-CON M) 20 MEQ tablet Take 1 tablet (20 mEq total) by mouth daily for 3 days.   Facility-Administered Encounter Medications as of 09/21/2022  Medication   bisacodyl (DULCOLAX) suppository 10 mg    Review of Systems  Constitutional:  Negative for activity change, appetite change, chills, diaphoresis, fatigue, fever and unexpected weight change.  HENT:  Negative for congestion.   Respiratory:   Positive for cough and wheezing. Negative for choking and shortness of breath.   Cardiovascular:  Negative for chest pain, palpitations and leg swelling.  Gastrointestinal:  Negative for abdominal distention, abdominal pain, constipation and diarrhea.  Genitourinary:  Negative for difficulty urinating and dysuria.  Musculoskeletal:  Positive for gait problem. Negative for arthralgias, back pain, joint swelling and myalgias.  Neurological:  Positive for weakness. Negative for dizziness, tremors, seizures, syncope, facial asymmetry, speech difficulty, light-headedness, numbness and headaches.  Psychiatric/Behavioral:  Negative for agitation, behavioral problems and confusion.     Immunization History  Administered Date(s) Administered   Covid-19, Mrna,Vaccine(Spikevax)52yrs and older 04/10/2022   Moderna SARS-COV2 Booster Vaccination 10/05/2021   Moderna Sars-Covid-2 Vaccination 07/18/2019, 04/28/2020   PNEUMOCOCCAL CONJUGATE-20 08/09/2021   Tdap 08/11/2011   Zoster Recombinat (Shingrix) 11/18/2020, 03/10/2022   Pertinent  Health Maintenance Due  Topic Date Due   INFLUENZA VACCINE  01/04/2023   DEXA SCAN  Completed      01/08/2019    2:37 PM 01/08/2019    9:28 PM 01/09/2019    8:23 AM 01/09/2019   10:26 PM 01/10/2019   11:08 AM  Fall Risk  (RETIRED) Patient Fall Risk Level Low fall risk High fall risk High fall risk High fall risk High fall risk   Functional Status Survey:    Vitals:   09/21/22 0902  BP: 112/71  Pulse: 60  Resp: 18  Temp: 98 F (36.7 C)  SpO2: 96%  Weight: 140 lb (63.5 kg)   Body mass index is 24.8 kg/m. Physical Exam Vitals and nursing note reviewed.  Constitutional:      General: She is not in acute distress.    Appearance: She is not diaphoretic.  HENT:     Head: Normocephalic and atraumatic.     Nose: No congestion.     Mouth/Throat:     Mouth: Mucous membranes are dry.     Pharynx: Oropharynx is clear. No oropharyngeal exudate.  Neck:      Vascular: No JVD.  Cardiovascular:     Rate and Rhythm: Normal rate and regular rhythm.     Heart sounds: No murmur heard. Pulmonary:     Effort: Pulmonary effort is normal. No respiratory distress.     Breath sounds: Rhonchi present. No wheezing.  Abdominal:     General: Bowel sounds are normal. There is no distension.     Palpations: Abdomen  is soft.     Tenderness: There is no abdominal tenderness.  Musculoskeletal:     Right lower leg: No edema.     Left lower leg: No edema.  Skin:    General: Skin is warm and dry.  Neurological:     Mental Status: She is alert and oriented to person, place, and time.     Labs reviewed: Recent Labs    09/04/22 0750 09/05/22 0556 09/06/22 0540 09/07/22 0915  NA 134* 137 137 137  K 3.8 4.1 4.0 4.0  CL 106 110 110 104  CO2 22 22 23 25   GLUCOSE 75 77 85 101*  BUN 14 15 18 18   CREATININE 1.15* 1.16* 1.26* 1.26*  CALCIUM 8.4* 7.9* 8.6* 8.7*  PHOS 2.5 3.4 3.0  --     Recent Labs    09/02/22 1137 09/03/22 0642 09/04/22 0750 09/05/22 0556 09/06/22 0540  AST 329* 239* 208*  --   --   ALT 286* 240* 220*  --   --   ALKPHOS 166* 151* 161*  --   --   BILITOT 0.8 0.9 1.0  --   --   PROT 6.4* 5.5* 6.1*  --   --   ALBUMIN 2.9* 2.3* 2.5*  2.5* 2.1* 2.4*    Recent Labs    09/01/22 2037 09/02/22 0713 09/03/22 0642 09/07/22 0915  WBC 7.5 8.7 7.8 8.3  NEUTROABS 5.1  --  5.2 5.3  HGB 12.6 12.1 11.8* 12.6  HCT 38.4 36.1 35.0* 38.8  MCV 96.2 95.0 95.6 97.7  PLT 173 194 PLATELET CLUMPS NOTED ON SMEAR, UNABLE TO ESTIMATE 144*    Lab Results  Component Value Date   TSH 0.067 (L) 09/02/2022   No results found for: "HGBA1C" Lab Results  Component Value Date   CHOL 128 12/13/2018   HDL 79 12/13/2018   LDLCALC 36 12/13/2018   TRIG 63 12/13/2018   CHOLHDL 1.6 12/13/2018    Significant Diagnostic Results in last 30 days:  ECHOCARDIOGRAM COMPLETE  Result Date: 09/07/2022    ECHOCARDIOGRAM REPORT   Patient Name:   Cassandra Ray  Date of Exam: 09/07/2022 Medical Rec #:  [id number]        Height:       63.8 in Accession #:    [id number]       Weight:       142.0 lb Date of Birth:  [birth year]       BSA:          1.688 m Patient Age:    89 years         BP:           137/67 mmHg Patient Gender: F                HR:           69 bpm. Exam Location:  Inpatient Procedure: 2D Echo, Cardiac Doppler and Color Doppler Indications:    I50.21 Acute systolic (congestive) heart failure  History:        Patient has prior history of Echocardiogram examinations. CHF;                 Risk Factors:Hypertension.  Sonographer:    Mike Gip Referring Phys: [id number] RAVI PAHWANI IMPRESSIONS  1. Left ventricular ejection fraction, by estimation, is 70 to 75%. The left ventricle has hyperdynamic function. The left ventricle has no regional wall motion abnormalities. Left ventricular diastolic parameters are consistent with Grade II diastolic dysfunction (  pseudonormalization). Elevated left ventricular end-diastolic pressure.  2. Right ventricular systolic function is normal. The right ventricular size is normal. There is moderately elevated pulmonary artery systolic pressure.  3. Left atrial size was severely dilated.  4. Right atrial size was severely dilated.  5. The mitral valve is degenerative. Trivial mitral valve regurgitation. No evidence of mitral stenosis. Moderate mitral annular calcification.  6. The aortic valve is tricuspid. Aortic valve regurgitation is not visualized. No aortic stenosis is present.  7. The inferior vena cava is normal in size with greater than 50% respiratory variability, suggesting right atrial pressure of 3 mmHg. FINDINGS  Left Ventricle: Left ventricular ejection fraction, by estimation, is 70 to 75%. The left ventricle has hyperdynamic function. The left ventricle has no regional wall motion abnormalities. The left ventricular internal cavity size was normal in size. There is no left ventricular hypertrophy. Left ventricular  diastolic parameters are consistent with Grade II diastolic dysfunction (pseudonormalization). Elevated left ventricular end-diastolic pressure. Right Ventricle: The right ventricular size is normal. No increase in right ventricular wall thickness. Right ventricular systolic function is normal. There is moderately elevated pulmonary artery systolic pressure. The tricuspid regurgitant velocity is 3.34 m/s, and with an assumed right atrial pressure of 3 mmHg, the estimated right ventricular systolic pressure is 47.6 mmHg. Left Atrium: Left atrial size was severely dilated. Right Atrium: Right atrial size was severely dilated. Pericardium: There is no evidence of pericardial effusion. Mitral Valve: The mitral valve is degenerative in appearance. Moderate mitral annular calcification. Trivial mitral valve regurgitation. No evidence of mitral valve stenosis. Tricuspid Valve: The tricuspid valve is normal in structure. Tricuspid valve regurgitation is mild . No evidence of tricuspid stenosis. Aortic Valve: The aortic valve is tricuspid. Aortic valve regurgitation is not visualized. No aortic stenosis is present. Pulmonic Valve: The pulmonic valve was normal in structure. Pulmonic valve regurgitation is mild. No evidence of pulmonic stenosis. Aorta: The aortic root is normal in size and structure. Venous: The inferior vena cava is normal in size with greater than 50% respiratory variability, suggesting right atrial pressure of 3 mmHg. IAS/Shunts: No atrial level shunt detected by color flow Doppler.  LEFT VENTRICLE PLAX 2D LVIDd:         2.90 cm     Diastology LVIDs:         1.60 cm     LV e' medial:    4.60 cm/s LV PW:         1.00 cm     LV E/e' medial:  30.7 LV IVS:        1.00 cm     LV e' lateral:   7.23 cm/s LVOT diam:     1.70 cm     LV E/e' lateral: 19.5 LV SV:         73 LV SV Index:   43 LVOT Area:     2.27 cm  LV Volumes (MOD) LV vol d, MOD A2C: 72.6 ml LV vol d, MOD A4C: 61.5 ml LV vol s, MOD A2C: 21.1 ml LV  vol s, MOD A4C: 17.7 ml LV SV MOD A2C:     51.5 ml LV SV MOD A4C:     61.5 ml LV SV MOD BP:      50.5 ml RIGHT VENTRICLE RV Basal diam:  3.80 cm RV S prime:     15.50 cm/s TAPSE (M-mode): 2.6 cm LEFT ATRIUM             Index  RIGHT ATRIUM           Index LA diam:        3.50 cm 2.07 cm/m   RA Area:     14.40 cm LA Vol (A2C):   62.6 ml 37.06 ml/m  RA Volume:   29.50 ml  17.48 ml/m LA Vol (A4C):   87.0 ml 51.55 ml/m LA Biplane Vol: 81.9 ml 48.53 ml/m  AORTIC VALVE LVOT Vmax:   129.00 cm/s LVOT Vmean:  93.100 cm/s LVOT VTI:    0.323 m  AORTA Ao Root diam: 2.90 cm Ao Asc diam:  3.10 cm MITRAL VALVE                TRICUSPID VALVE MV Area (PHT): 3.03 cm     TR Peak grad:   44.6 mmHg MV Decel Time: 250 msec     TR Vmax:        334.00 cm/s MV E velocity: 141.00 cm/s MV A velocity: 78.30 cm/s   SHUNTS MV E/A ratio:  1.80         Systemic VTI:  0.32 m                             Systemic Diam: 1.70 cm Chilton Si MD Electronically signed by Chilton Si MD Signature Date/Time: 09/07/2022/12:37:46 PM    Final    DG Chest 2 View  Result Date: 09/06/2022 CLINICAL DATA:  Pneumonia EXAM: CHEST - 2 VIEW COMPARISON:  Previous studies including the examination of 09/03/2022 FINDINGS: Transverse diameter of heart is increased. Central pulmonary vessels are prominent. Increased interstitial markings are seen in both parahilar regions and both lower lung fields. Small bilateral pleural effusions are seen. There is no pneumothorax. No significant interval changes are noted. There is soft tissue fullness in retrocardiac region suggesting possible fixed hiatal hernia. IMPRESSION: Increased interstitial markings in both lungs suggest possible interstitial pulmonary edema with no significant interval change. Small bilateral pleural effusions, more so on the left side. Possibility of atelectasis/pneumonia in the lower lung fields is not excluded. Electronically Signed   By: Ernie Avena M.D.   On: 09/06/2022  12:08   DG Swallowing Func-Speech Pathology  Result Date: 09/04/2022 Table formatting from the original result was not included. Modified Barium Swallow Study Patient Details Name: Cassandra Ray MRN: [id number] Date of Birth: 08/03/1922 Today's Date: 09/04/2022 HPI/PMH: HPI: Patient is a 89 year old female admitted to [facility] with left lower lobe pneumonia, hyponatremia.  Patient has history of GERD, breast cancer, dysphagia, hiatal hernia.  She was treated for pneumonia several days prior to admission but did not improve.  Prior esophagram in 2016 showed hiatal hernia as well as traction diverticulum below the carina, normal motility, flash penetration of 50 liquid.  Her GI doctor at that time did not advise endoscopy.  Swallow evaluation ordered by Dr. Mahala Menghini.  CXR 09/03/2022 shows  Small left pleural effusion and mild interstitial edema.  Correlate for any signs/symptoms of CHF.  2. Decreased aeration to the left base may reflect atelectasis or  airspace disease. Clinical Impression: Clinical Impression: Patient was initially reluctant but particiapted fully in this MBS. SLP assessed her swallow with the following consistencies of barium: thin, nectar thick, puree. Full MBSimp protocol not followed secondary to patient's willingness and ability to participate. She exhibits a moderately impaired oral and mild-moderately impaired pharyngeal phase of swallow characterized by incomplete epiglottic inversion, weak tongue base retraction and reduced  PES opening. i n addition, she appeared with xerostomia of oral cavity. These impairments led to prolonged anterior to posterior transit of bolus in oral cavity, mild and at times mild-moderate amount of barium residuals at base of tongue and vallecular sinus. PES opening appeared improved with larger bolus size (ie cup or straw sip as opposed to spoon). Although only partial epiglottic inversion was observed with initial spoon sip of thin liquids, full  inversion was observed with all subsequent swallows.  No penetration or aspiration observed with any of the tested consistencies. Patient did have incidents of wet sounding cough, but this was in the absence of any barium in laryngeal vestibule and was likely due to secretions. SLP recommending continue with full liquids, however plan to assess her toleration of advanced solids at bedside. Factors that may increase risk of adverse event in presence of aspiration Rubye Oaks & Clearance Coots 2021): Factors that may increase risk of adverse event in presence of aspiration Rubye Oaks & Clearance Coots 2021): Poor general health and/or compromised immunity; Frail or deconditioned; Weak cough; Reduced saliva Recommendations/Plan: Swallowing Evaluation Recommendations Swallowing Evaluation Recommendations Recommendations: PO diet PO Diet Recommendation: Full liquid diet; Thin liquids (Level 0) Liquid Administration via: Cup; Straw Medication Administration: Whole meds with puree Supervision: Full supervision/cueing for swallowing strategies; Full assist for feeding Swallowing strategies  : Slow rate; Small bites/sips; Check for pocketing or oral holding Postural changes: Position pt fully upright for meals; Stay upright 30-60 min after meals Oral care recommendations: Oral care BID (2x/day); Staff/trained caregiver to provide oral care Treatment Plan Treatment Plan Treatment recommendations: Therapy as outlined in treatment plan below Follow-up recommendations: Follow physicians's recommendations for discharge plan and follow up therapies Functional status assessment: Patient has had a recent decline in their functional status and demonstrates the ability to make significant improvements in function in a reasonable and predictable amount of time. Treatment frequency: Min 2x/week Treatment duration: 1 week Interventions: Compensatory techniques; Patient/family education; Trials of upgraded texture/liquids; Diet toleration management by SLP  Recommendations Recommendations for follow up therapy are one component of a multi-disciplinary discharge planning process, led by the attending physician.  Recommendations may be updated based on patient status, additional functional criteria and insurance authorization. Assessment: Orofacial Exam: Orofacial Exam Oral Cavity: Oral Hygiene: Xerostomia Oral Cavity - Dentition: Adequate natural dentition Orofacial Anatomy: WFL Anatomy: Anatomy: Prominent cricopharyngeus; Suspected cervical osteophytes Boluses Administered: Boluses Administered Boluses Administered: Thin liquids (Level 0); Mildly thick liquids (Level 2, nectar thick); Puree  Oral Impairment Domain: Oral Impairment Domain Lip Closure: Escape from interlabial space or lateral juncture, no extension beyond vermillion border Tongue control during bolus hold: Not tested Bolus transport/lingual motion: Slow tongue motion Oral residue: Trace residue lining oral structures Location of oral residue : Tongue; Palate Initiation of pharyngeal swallow : Valleculae  Pharyngeal Impairment Domain: Pharyngeal Impairment Domain Soft palate elevation: No bolus between soft palate (SP)/pharyngeal wall (PW) Laryngeal elevation: Complete superior movement of thyroid cartilage with complete approximation of arytenoids to epiglottic petiole Anterior hyoid excursion: Complete anterior movement Epiglottic movement: Partial inversion Laryngeal vestibule closure: Complete, no air/contrast in laryngeal vestibule Pharyngeal stripping wave : Present - complete Pharyngeal contraction (A/P view only): N/A Pharyngoesophageal segment opening: Partial distention/partial duration, partial obstruction of flow Tongue base retraction: Trace column of contrast or air between tongue base and PPW Pharyngeal residue: Collection of residue within or on pharyngeal structures Location of pharyngeal residue: Valleculae; Tongue base  Esophageal Impairment Domain: Esophageal Impairment Domain  Esophageal clearance upright position: Esophageal retention Pill: Esophageal  Impairment Domain Esophageal clearance upright position: Esophageal retention Penetration/Aspiration Scale Score: Penetration/Aspiration Scale Score 1.  Material does not enter airway: Thin liquids (Level 0); Mildly thick liquids (Level 2, nectar thick); Puree Compensatory Strategies: Compensatory Strategies Compensatory strategies: Yes Straw: Effective Effective Straw: Thin liquid (Level 0)   General Information: Caregiver present: No  Diet Prior to this Study: Full liquid diet   Temperature : Normal   Respiratory Status: WFL   Supplemental O2: None (Room air)   History of Recent Intubation: No  Behavior/Cognition: Alert; Cooperative Self-Feeding Abilities: Needs assist with self-feeding Baseline vocal quality/speech: Hypophonia/low volume No data recorded Volitional Swallow: Able to elicit Exam Limitations: No limitations Goal Planning: Prognosis for improved oropharyngeal function: Fair Barriers to Reach Goals: Severity of deficits; Behavior No data recorded Patient/Family Stated Goal: pt states "I'm dying" than says "I don't want to feel this way" Consulted and agree with results and recommendations: Pt unable/family or caregiver not available Pain: Pain Assessment Pain Assessment: No/denies pain End of Session: Start Time:SLP Start Time (ACUTE ONLY): 1110 Stop Time: SLP Stop Time (ACUTE ONLY): 1130 Time Calculation:SLP Time Calculation (min) (ACUTE ONLY): 20 min Charges: SLP Evaluations $ SLP Speech Visit: 1 Visit SLP Evaluations $BSS Swallow: 1 Procedure $MBS Swallow: 1 Procedure SLP visit diagnosis: SLP Visit Diagnosis: Dysphagia, oropharyngeal phase (R13.12) Past Medical History: Past Medical History: Diagnosis Date  Actinic keratoses   Arthritis   back   Breast cancer (HCC)   Breast cancer (right)  Chronic diastolic CHF (congestive heart failure) (HCC)   CKD (chronic kidney disease), stage III (HCC)   Compression fracture of L3  lumbar vertebra   Dysphagia   GERD (gastroesophageal reflux disease)   takes otc Pepcid as needed  Hiatal hernia 11/27/2014  HTN (hypertension)   Hypothyroidism   Insomnia   Osteopenia   PAF (paroxysmal atrial fibrillation) (HCC)   a. questionable episode in 2015 in [place]. b. event monitor through December/January 2014/2015 which showed no evidence of atrial fibrillation. c. Dx 12/2018.  PAT (paroxysmal atrial tachycardia)   Pleural effusion  Past Surgical History: Past Surgical History: Procedure Laterality Date  BREAST LUMPECTOMY Right 1995  lumpectomy  COLONOSCOPY    ESOPHAGOGASTRODUODENOSCOPY (EGD) WITH PROPOFOL Left 01/09/2019  Procedure: ESOPHAGOGASTRODUODENOSCOPY (EGD) WITH PROPOFOL;  Surgeon: Jeani Hawking, MD;  Location: [facility];  Service: Endoscopy;  Laterality: Left;  EYE SURGERY Bilateral   cataract w/ lens implant  KYPHOPLASTY N/A 09/18/2013  Procedure: KYPHOPLASTY;  Surgeon: Emilee Hero, MD;  Location: [facility] OR;  Service: Orthopedics;  Laterality: N/A;  Lumbar 3 kyphoplasty  TONSILLECTOMY   Angela Nevin, MA, CCC-SLP Speech Therapy   DG ESOPHAGUS W SINGLE CM (SOL OR THIN BA)  Result Date: 09/04/2022 CLINICAL DATA:  Coughing after eating and drinking. Evaluate for an esophageal stricture. EXAM: ESOPHOGRAM/BARIUM SWALLOW TECHNIQUE: Single contrast examination was performed using  thin barium. FLUOROSCOPY: Radiation Exposure Index (as provided by the fluoroscopic device): 4.6 mGy Kerma COMPARISON:  None. FINDINGS: A very limited examination was performed in the recumbent LPO position, due to patient condition. Patient was only able to drink very minimal contrast with coughing after each swallow. Poor esophageal motility with tertiary contractions. Suspect a distal esophageal stricture. Study was terminated due to excessive coughing and patient condition. Moderate to large hiatal hernia. IMPRESSION: 1. Very limited examination was performed due to patient condition, as detailed above. 2. Suspect  a distal esophageal stricture. Tablet was not administered due to patient condition. 3. Esophageal dysmotility. 4. Moderate to large hiatal hernia.  Electronically Signed   By: Leanna Battles M.D.   On: 09/04/2022 12:01   DG Chest 2 View  Result Date: 09/03/2022 CLINICAL DATA:  Evaluate pneumonia. EXAM: CHEST - 2 VIEW COMPARISON:  09/01/22 FINDINGS: Stable cardiomediastinal contours. Lung volumes are low with mild asymmetric elevation of the right hemidiaphragm. Increase interstitial markings are identified bilaterally concerning for mild interstitial edema. Small left pleural effusion identified. Decreased aeration to the left base may reflect atelectasis or airspace disease. IMPRESSION: 1. Small left pleural effusion and mild interstitial edema. Correlate for any signs/symptoms of CHF. 2. Decreased aeration to the left base may reflect atelectasis or airspace disease. Electronically Signed   By: Signa Kell M.D.   On: 09/03/2022 17:16   US Abdomen Limited RUQ (LIVER/GB)  Result Date: 09/02/2022 CLINICAL DATA:  Elevated LFTs EXAM: ULTRASOUND ABDOMEN LIMITED RIGHT UPPER QUADRANT COMPARISON:  12/11/2014 FINDINGS: Gallbladder: No gallstones or wall thickening visualized. No sonographic Murphy sign noted by sonographer. Common bile duct: Diameter: 2.9 mm Liver: Mildly increased in echogenicity without evidence of focal mass. Portal vein is patent on color Doppler imaging with normal direction of blood flow towards the liver. Other: None. IMPRESSION: Fatty infiltration of the liver. No other focal abnormality is noted. Electronically Signed   By: Alcide Clever M.D.   On: 09/02/2022 02:53   DG Chest Port 1 View  Result Date: 09/01/2022 CLINICAL DATA:  Short of breath, pneumonia EXAM: PORTABLE CHEST 1 VIEW COMPARISON:  01/08/2019 FINDINGS: Single frontal view of the chest demonstrates a stable cardiac silhouette. There is diffuse interstitial prominence throughout the lungs, with slight progression since  prior study. Increased density at the left lateral lung base with loss of the margins of the left hemidiaphragm could reflect left lower lobe pneumonia. No large effusion or pneumothorax. No acute bony abnormality. IMPRESSION: 1. Increased density left lateral lung base which could reflect left lower lobe pneumonia. 2. Progressive interstitial prominence throughout the lungs, which could reflect progressive scarring or superimposed reactive airway disease. Electronically Signed   By: Sharlet Salina M.D.   On: 09/01/2022 20:30    Assessment/Plan  1. Mild Asthma with acute exacerbation  ?hx of ?reactive airway disease" Did improve with prednisone, also had increased lasix.  At one point was on symbicort No PFTs to review Consider adding pulmicort to regimen if needed Continue duonebs at this time.   2. HCAP (healthcare-associated pneumonia) vs aspiration  Finished cefidinir Improved wheezing but still present   3. Acute respiratory failure with hypoxia Still on 2 liters of oxygen but sats were ok without out Discussed we need to taper oxygen for sats >90%   4. Dysphagia Mild to moderate at this time with strategies, diet modification Does have hiatal hernia and dysmotility, possible stricture not worked up due to age/debility Working with ST discussed case with her. No signs of aspiration at this time.  D3 diet chopped  5. Thrombocytopenia Mild Repeat plt 130K 09/19/22, continue to monitor.   6. Transaminitis Improving ?due to amiodarone which has been discontinued.   7. Diastolic CHF On Lasix 20 mg daily after increased dosing for three days.  Does not appear overloaded.   8. Hypothyroidism TSH pending 4/25 Was too low.   Family/ staff Communication: nurse  Labs/tests ordered:  TSH 4/25

## 2022-09-22 DIAGNOSIS — R1312 Dysphagia, oropharyngeal phase: Secondary | ICD-10-CM | POA: Diagnosis not present

## 2022-09-22 DIAGNOSIS — J690 Pneumonitis due to inhalation of food and vomit: Secondary | ICD-10-CM | POA: Diagnosis not present

## 2022-09-22 DIAGNOSIS — K219 Gastro-esophageal reflux disease without esophagitis: Secondary | ICD-10-CM | POA: Diagnosis not present

## 2022-09-22 DIAGNOSIS — M6259 Muscle wasting and atrophy, not elsewhere classified, multiple sites: Secondary | ICD-10-CM | POA: Diagnosis not present

## 2022-09-22 DIAGNOSIS — R2689 Other abnormalities of gait and mobility: Secondary | ICD-10-CM | POA: Diagnosis not present

## 2022-09-25 DIAGNOSIS — R1312 Dysphagia, oropharyngeal phase: Secondary | ICD-10-CM | POA: Diagnosis not present

## 2022-09-25 DIAGNOSIS — R2689 Other abnormalities of gait and mobility: Secondary | ICD-10-CM | POA: Diagnosis not present

## 2022-09-25 DIAGNOSIS — M6259 Muscle wasting and atrophy, not elsewhere classified, multiple sites: Secondary | ICD-10-CM | POA: Diagnosis not present

## 2022-09-25 DIAGNOSIS — K219 Gastro-esophageal reflux disease without esophagitis: Secondary | ICD-10-CM | POA: Diagnosis not present

## 2022-09-25 DIAGNOSIS — J690 Pneumonitis due to inhalation of food and vomit: Secondary | ICD-10-CM | POA: Diagnosis not present

## 2022-09-26 DIAGNOSIS — M6259 Muscle wasting and atrophy, not elsewhere classified, multiple sites: Secondary | ICD-10-CM | POA: Diagnosis not present

## 2022-09-26 DIAGNOSIS — J690 Pneumonitis due to inhalation of food and vomit: Secondary | ICD-10-CM | POA: Diagnosis not present

## 2022-09-26 DIAGNOSIS — R1312 Dysphagia, oropharyngeal phase: Secondary | ICD-10-CM | POA: Diagnosis not present

## 2022-09-26 DIAGNOSIS — R2689 Other abnormalities of gait and mobility: Secondary | ICD-10-CM | POA: Diagnosis not present

## 2022-09-26 DIAGNOSIS — K219 Gastro-esophageal reflux disease without esophagitis: Secondary | ICD-10-CM | POA: Diagnosis not present

## 2022-09-27 DIAGNOSIS — K219 Gastro-esophageal reflux disease without esophagitis: Secondary | ICD-10-CM | POA: Diagnosis not present

## 2022-09-27 DIAGNOSIS — R1312 Dysphagia, oropharyngeal phase: Secondary | ICD-10-CM | POA: Diagnosis not present

## 2022-09-27 DIAGNOSIS — J690 Pneumonitis due to inhalation of food and vomit: Secondary | ICD-10-CM | POA: Diagnosis not present

## 2022-09-27 DIAGNOSIS — R2689 Other abnormalities of gait and mobility: Secondary | ICD-10-CM | POA: Diagnosis not present

## 2022-09-27 DIAGNOSIS — M6259 Muscle wasting and atrophy, not elsewhere classified, multiple sites: Secondary | ICD-10-CM | POA: Diagnosis not present

## 2022-09-28 DIAGNOSIS — R2689 Other abnormalities of gait and mobility: Secondary | ICD-10-CM | POA: Diagnosis not present

## 2022-09-28 DIAGNOSIS — R1312 Dysphagia, oropharyngeal phase: Secondary | ICD-10-CM | POA: Diagnosis not present

## 2022-09-28 DIAGNOSIS — J690 Pneumonitis due to inhalation of food and vomit: Secondary | ICD-10-CM | POA: Diagnosis not present

## 2022-09-28 DIAGNOSIS — I10 Essential (primary) hypertension: Secondary | ICD-10-CM | POA: Diagnosis not present

## 2022-09-28 DIAGNOSIS — K219 Gastro-esophageal reflux disease without esophagitis: Secondary | ICD-10-CM | POA: Diagnosis not present

## 2022-09-28 DIAGNOSIS — M6259 Muscle wasting and atrophy, not elsewhere classified, multiple sites: Secondary | ICD-10-CM | POA: Diagnosis not present

## 2022-09-28 LAB — TSH: TSH: 3.14 (ref 0.41–5.90)

## 2022-09-29 DIAGNOSIS — J690 Pneumonitis due to inhalation of food and vomit: Secondary | ICD-10-CM | POA: Diagnosis not present

## 2022-09-29 DIAGNOSIS — K219 Gastro-esophageal reflux disease without esophagitis: Secondary | ICD-10-CM | POA: Diagnosis not present

## 2022-09-29 DIAGNOSIS — R1312 Dysphagia, oropharyngeal phase: Secondary | ICD-10-CM | POA: Diagnosis not present

## 2022-10-02 DIAGNOSIS — J690 Pneumonitis due to inhalation of food and vomit: Secondary | ICD-10-CM | POA: Diagnosis not present

## 2022-10-02 DIAGNOSIS — M6259 Muscle wasting and atrophy, not elsewhere classified, multiple sites: Secondary | ICD-10-CM | POA: Diagnosis not present

## 2022-10-02 DIAGNOSIS — R2689 Other abnormalities of gait and mobility: Secondary | ICD-10-CM | POA: Diagnosis not present

## 2022-10-03 DIAGNOSIS — J690 Pneumonitis due to inhalation of food and vomit: Secondary | ICD-10-CM | POA: Diagnosis not present

## 2022-10-03 DIAGNOSIS — M6259 Muscle wasting and atrophy, not elsewhere classified, multiple sites: Secondary | ICD-10-CM | POA: Diagnosis not present

## 2022-10-03 DIAGNOSIS — R2689 Other abnormalities of gait and mobility: Secondary | ICD-10-CM | POA: Diagnosis not present

## 2022-10-03 DIAGNOSIS — R1312 Dysphagia, oropharyngeal phase: Secondary | ICD-10-CM | POA: Diagnosis not present

## 2022-10-03 DIAGNOSIS — K219 Gastro-esophageal reflux disease without esophagitis: Secondary | ICD-10-CM | POA: Diagnosis not present

## 2022-10-04 DIAGNOSIS — K219 Gastro-esophageal reflux disease without esophagitis: Secondary | ICD-10-CM | POA: Diagnosis not present

## 2022-10-04 DIAGNOSIS — M6259 Muscle wasting and atrophy, not elsewhere classified, multiple sites: Secondary | ICD-10-CM | POA: Diagnosis not present

## 2022-10-04 DIAGNOSIS — R2689 Other abnormalities of gait and mobility: Secondary | ICD-10-CM | POA: Diagnosis not present

## 2022-10-04 DIAGNOSIS — R1312 Dysphagia, oropharyngeal phase: Secondary | ICD-10-CM | POA: Diagnosis not present

## 2022-10-04 DIAGNOSIS — J690 Pneumonitis due to inhalation of food and vomit: Secondary | ICD-10-CM | POA: Diagnosis not present

## 2022-10-05 DIAGNOSIS — J690 Pneumonitis due to inhalation of food and vomit: Secondary | ICD-10-CM | POA: Diagnosis not present

## 2022-10-05 DIAGNOSIS — M6259 Muscle wasting and atrophy, not elsewhere classified, multiple sites: Secondary | ICD-10-CM | POA: Diagnosis not present

## 2022-10-05 DIAGNOSIS — R2689 Other abnormalities of gait and mobility: Secondary | ICD-10-CM | POA: Diagnosis not present

## 2022-10-06 DIAGNOSIS — R1312 Dysphagia, oropharyngeal phase: Secondary | ICD-10-CM | POA: Diagnosis not present

## 2022-10-06 DIAGNOSIS — R2689 Other abnormalities of gait and mobility: Secondary | ICD-10-CM | POA: Diagnosis not present

## 2022-10-06 DIAGNOSIS — K219 Gastro-esophageal reflux disease without esophagitis: Secondary | ICD-10-CM | POA: Diagnosis not present

## 2022-10-06 DIAGNOSIS — M6259 Muscle wasting and atrophy, not elsewhere classified, multiple sites: Secondary | ICD-10-CM | POA: Diagnosis not present

## 2022-10-06 DIAGNOSIS — J690 Pneumonitis due to inhalation of food and vomit: Secondary | ICD-10-CM | POA: Diagnosis not present

## 2022-10-09 DIAGNOSIS — J690 Pneumonitis due to inhalation of food and vomit: Secondary | ICD-10-CM | POA: Diagnosis not present

## 2022-10-09 DIAGNOSIS — M6259 Muscle wasting and atrophy, not elsewhere classified, multiple sites: Secondary | ICD-10-CM | POA: Diagnosis not present

## 2022-10-10 DIAGNOSIS — J690 Pneumonitis due to inhalation of food and vomit: Secondary | ICD-10-CM | POA: Diagnosis not present

## 2022-10-10 DIAGNOSIS — M6259 Muscle wasting and atrophy, not elsewhere classified, multiple sites: Secondary | ICD-10-CM | POA: Diagnosis not present

## 2022-10-10 DIAGNOSIS — R2689 Other abnormalities of gait and mobility: Secondary | ICD-10-CM | POA: Diagnosis not present

## 2022-10-11 DIAGNOSIS — J690 Pneumonitis due to inhalation of food and vomit: Secondary | ICD-10-CM | POA: Diagnosis not present

## 2022-10-11 DIAGNOSIS — M6259 Muscle wasting and atrophy, not elsewhere classified, multiple sites: Secondary | ICD-10-CM | POA: Diagnosis not present

## 2022-10-11 DIAGNOSIS — R2689 Other abnormalities of gait and mobility: Secondary | ICD-10-CM | POA: Diagnosis not present

## 2022-10-12 DIAGNOSIS — J690 Pneumonitis due to inhalation of food and vomit: Secondary | ICD-10-CM | POA: Diagnosis not present

## 2022-10-12 DIAGNOSIS — M6259 Muscle wasting and atrophy, not elsewhere classified, multiple sites: Secondary | ICD-10-CM | POA: Diagnosis not present

## 2022-10-13 DIAGNOSIS — J690 Pneumonitis due to inhalation of food and vomit: Secondary | ICD-10-CM | POA: Diagnosis not present

## 2022-10-13 DIAGNOSIS — M6259 Muscle wasting and atrophy, not elsewhere classified, multiple sites: Secondary | ICD-10-CM | POA: Diagnosis not present

## 2022-10-13 DIAGNOSIS — R2689 Other abnormalities of gait and mobility: Secondary | ICD-10-CM | POA: Diagnosis not present

## 2022-10-16 DIAGNOSIS — R2689 Other abnormalities of gait and mobility: Secondary | ICD-10-CM | POA: Diagnosis not present

## 2022-10-16 DIAGNOSIS — M6259 Muscle wasting and atrophy, not elsewhere classified, multiple sites: Secondary | ICD-10-CM | POA: Diagnosis not present

## 2022-10-16 DIAGNOSIS — J690 Pneumonitis due to inhalation of food and vomit: Secondary | ICD-10-CM | POA: Diagnosis not present

## 2022-10-17 DIAGNOSIS — R2689 Other abnormalities of gait and mobility: Secondary | ICD-10-CM | POA: Diagnosis not present

## 2022-10-17 DIAGNOSIS — M6259 Muscle wasting and atrophy, not elsewhere classified, multiple sites: Secondary | ICD-10-CM | POA: Diagnosis not present

## 2022-10-17 DIAGNOSIS — J690 Pneumonitis due to inhalation of food and vomit: Secondary | ICD-10-CM | POA: Diagnosis not present

## 2022-10-18 DIAGNOSIS — M6259 Muscle wasting and atrophy, not elsewhere classified, multiple sites: Secondary | ICD-10-CM | POA: Diagnosis not present

## 2022-10-18 DIAGNOSIS — J690 Pneumonitis due to inhalation of food and vomit: Secondary | ICD-10-CM | POA: Diagnosis not present

## 2022-10-18 DIAGNOSIS — R2689 Other abnormalities of gait and mobility: Secondary | ICD-10-CM | POA: Diagnosis not present

## 2022-10-19 DIAGNOSIS — R2689 Other abnormalities of gait and mobility: Secondary | ICD-10-CM | POA: Diagnosis not present

## 2022-10-19 DIAGNOSIS — J690 Pneumonitis due to inhalation of food and vomit: Secondary | ICD-10-CM | POA: Diagnosis not present

## 2022-10-19 DIAGNOSIS — M6259 Muscle wasting and atrophy, not elsewhere classified, multiple sites: Secondary | ICD-10-CM | POA: Diagnosis not present

## 2022-10-20 DIAGNOSIS — J690 Pneumonitis due to inhalation of food and vomit: Secondary | ICD-10-CM | POA: Diagnosis not present

## 2022-10-20 DIAGNOSIS — M6259 Muscle wasting and atrophy, not elsewhere classified, multiple sites: Secondary | ICD-10-CM | POA: Diagnosis not present

## 2022-10-20 DIAGNOSIS — R2689 Other abnormalities of gait and mobility: Secondary | ICD-10-CM | POA: Diagnosis not present

## 2022-10-23 DIAGNOSIS — J690 Pneumonitis due to inhalation of food and vomit: Secondary | ICD-10-CM | POA: Diagnosis not present

## 2022-10-23 DIAGNOSIS — M6259 Muscle wasting and atrophy, not elsewhere classified, multiple sites: Secondary | ICD-10-CM | POA: Diagnosis not present

## 2022-10-24 ENCOUNTER — Encounter: Payer: Self-pay | Admitting: Orthopedic Surgery

## 2022-10-24 ENCOUNTER — Non-Acute Institutional Stay (SKILLED_NURSING_FACILITY): Payer: Medicare Other | Admitting: Orthopedic Surgery

## 2022-10-24 DIAGNOSIS — R1313 Dysphagia, pharyngeal phase: Secondary | ICD-10-CM | POA: Diagnosis not present

## 2022-10-24 DIAGNOSIS — M6259 Muscle wasting and atrophy, not elsewhere classified, multiple sites: Secondary | ICD-10-CM | POA: Diagnosis not present

## 2022-10-24 DIAGNOSIS — R2689 Other abnormalities of gait and mobility: Secondary | ICD-10-CM | POA: Diagnosis not present

## 2022-10-24 DIAGNOSIS — R062 Wheezing: Secondary | ICD-10-CM

## 2022-10-24 DIAGNOSIS — I5032 Chronic diastolic (congestive) heart failure: Secondary | ICD-10-CM | POA: Diagnosis not present

## 2022-10-24 DIAGNOSIS — Z9889 Other specified postprocedural states: Secondary | ICD-10-CM | POA: Diagnosis not present

## 2022-10-24 DIAGNOSIS — J90 Pleural effusion, not elsewhere classified: Secondary | ICD-10-CM | POA: Diagnosis not present

## 2022-10-24 DIAGNOSIS — J690 Pneumonitis due to inhalation of food and vomit: Secondary | ICD-10-CM | POA: Diagnosis not present

## 2022-10-24 DIAGNOSIS — I509 Heart failure, unspecified: Secondary | ICD-10-CM | POA: Diagnosis not present

## 2022-10-24 MED ORDER — PREDNISONE 20 MG PO TABS
ORAL_TABLET | ORAL | 0 refills | Status: AC
Start: 2022-10-24 — End: 2022-10-31

## 2022-10-24 MED ORDER — FUROSEMIDE 20 MG PO TABS
40.0000 mg | ORAL_TABLET | Freq: Two times a day (BID) | ORAL | 0 refills | Status: DC
Start: 2022-10-24 — End: 2022-10-26

## 2022-10-24 NOTE — Addendum Note (Signed)
Addended byHazle Nordmann E on: 10/24/2022 04:42 PM   Modules accepted: Orders

## 2022-10-24 NOTE — Progress Notes (Addendum)
Location:   [facility] Room Number: 112-A Place of Service:  SNF [phone]) Provider:  Hazle Nordmann, NP  PCP: Daisy Floro, MD  Patient Care Team: Daisy Floro, MD as PCP - General (Family Medicine) Jake Bathe, MD as PCP - Cardiology (Cardiology)  Extended Emergency Contact Information Primary Emergency Contact: Lewers,Sarah Address: [address]          [place], [place] [address] [place] Phone: [phone] Relation: Daughter Secondary Emergency Contact: Merrily Pew Address: [address]          [place], [place] [address] [place] of [place] Home Phone: [phone] Mobile Phone: [phone] Relation: Spouse  Code Status:  DNR Goals of care: Advanced Directive information    10/24/2022    9:29 AM  Advanced Directives  Does Patient Have a Medical Advance Directive? Yes  Type of Estate agent of [place];Living will;Out of facility DNR (pink MOST or yellow form)  Does patient want to make changes to medical advance directive? No - Patient declined  Copy of Healthcare Power of Attorney in Chart? Yes - validated most recent copy scanned in chart (See row information)     Chief Complaint  Patient presents with   Acute Visit    Wheezing.    HPI:  Pt is a 89 y.o. female seen today for an acute visit due to unresolved wheezing.   She currently resides on the skilled nursing unit at [facility]. PMH: PAF> off anticoagulation due to camerons erosion, CFH HFpEF, HTN, CKD, recent pneumonia, dysphagia, GERD, hypothyroidism, transaminitis, and weakness.   Nursing reports wheezing x 2 days. She has been receiving duonebs BID without relief. Patient admits to intermittent wheezing for years. Hospitalized 03/29-04/04 due to aspiration pneumonia. She is on mechanical soft diet. She was wearing oxygen after hospitalization, but eventually weaned off. Pulmonary edema was also noted on  CXR last hospitalization. She has been taking furosemide daily. She denies chest pain, sob, or cough. Afebrile. Vitals stable.  Sats> 90%.    Past Medical History:  Diagnosis Date   Actinic keratoses    Arthritis    back    Atrial fibrillation with RVR (HCC)    Breast cancer (HCC)    Breast cancer (right)   Chronic diastolic CHF (congestive heart failure) (HCC)    CKD (chronic kidney disease), stage III (HCC)    Compression fracture of L3 lumbar vertebra    Dysphagia    GERD (gastroesophageal reflux disease)    takes otc Pepcid as needed   Hiatal hernia 11/27/2014   HTN (hypertension)    Hypothyroidism    Insomnia    Osteopenia    PAF (paroxysmal atrial fibrillation) (HCC)    a. questionable episode in 2015 in [place]. b. event monitor through December/January 2014/2015 which showed no evidence of atrial fibrillation. c. Dx 12/2018.   PAT (paroxysmal atrial tachycardia)    Pleural effusion    Past Surgical History:  Procedure Laterality Date   BREAST LUMPECTOMY Right 1995   lumpectomy   COLONOSCOPY     ESOPHAGOGASTRODUODENOSCOPY (EGD) WITH PROPOFOL Left 01/09/2019   Procedure: ESOPHAGOGASTRODUODENOSCOPY (EGD) WITH PROPOFOL;  Surgeon: Jeani Hawking, MD;  Location: [facility];  Service: Endoscopy;  Laterality: Left;   EYE SURGERY Bilateral    cataract w/ lens implant   KYPHOPLASTY N/A 09/18/2013   Procedure: KYPHOPLASTY;  Surgeon: Emilee Hero, MD;  Location: [facility] OR;  Service: Orthopedics;  Laterality: N/A;  Lumbar 3 kyphoplasty  TONSILLECTOMY      Allergies  Allergen Reactions   Cardizem [Diltiazem]    Eliquis [Apixaban]    Tape Other (See Comments)    Patient prefers easy-release tape    Allergies as of 10/24/2022       Reactions   Cardizem [diltiazem]    Eliquis [apixaban]    Tape Other (See Comments)   Patient prefers easy-release tape        Medication List        Accurate as of Oct 24, 2022  9:30 AM. If you have any questions, ask your nurse or  doctor.          acetaminophen 500 MG tablet Commonly known as: TYLENOL Take 2 tablets (1,000 mg total) by mouth every 8 (eight) hours.   Align 4 MG Caps Take 4 mg by mouth daily.   B-complex with vitamin C tablet Take 1 tablet by mouth daily.   benzonatate 100 MG capsule Commonly known as: TESSALON Take 100 mg by mouth 3 (three) times daily as needed for cough.   docusate sodium 100 MG capsule Commonly known as: COLACE Take 100 mg by mouth daily.   Ensure Take 237 mLs by mouth 2 (two) times daily between meals.   furosemide 20 MG tablet Commonly known as: Lasix Take 1 tablet (20 mg total) by mouth daily.   ipratropium-albuterol 0.5-2.5 (3) MG/3ML Soln Commonly known as: DUONEB Take 3 mLs by nebulization 4 (four) times daily.   ipratropium-albuterol 0.5-2.5 (3) MG/3ML Soln Commonly known as: DUONEB Take 3 mLs by nebulization 2 (two) times daily as needed (Wheezing/SOB).   levothyroxine 112 MCG tablet Commonly known as: SYNTHROID Take 112 mcg by mouth every morning.   melatonin 5 MG Tabs Take 5 mg by mouth at bedtime.   metoprolol tartrate 25 MG tablet Commonly known as: LOPRESSOR Take 0.5 tablets (12.5 mg total) by mouth 2 (two) times daily.   Mucinex 600 MG 12 hr tablet Generic drug: guaiFENesin Take 600 mg by mouth in the morning.   guaiFENesin 600 MG 12 hr tablet Commonly known as: MUCINEX Take 600 mg by mouth daily as needed for cough.   nitroGLYCERIN 0.4 MG SL tablet Commonly known as: NITROSTAT Place 1 tablet (0.4 mg total) under the tongue every 5 (five) minutes as needed for chest pain.   ondansetron 4 MG tablet Commonly known as: ZOFRAN Take 4 mg by mouth every 6 (six) hours as needed for nausea or vomiting.   pantoprazole 40 MG tablet Commonly known as: PROTONIX Take 1 tablet (40 mg total) by mouth daily.   polyethylene glycol powder 17 GM/SCOOP powder Commonly known as: GLYCOLAX/MIRALAX Take 17 g by mouth daily.    sennosides-docusate sodium 8.6-50 MG tablet Commonly known as: SENOKOT-S Take 1 tablet by mouth at bedtime as needed for constipation.   Vitamin D3 50 MCG (2000 UT) Tabs Take 2,000 Units by mouth daily.        Review of Systems  Constitutional:  Negative for activity change and appetite change.  HENT:  Positive for trouble swallowing. Negative for congestion and sore throat.   Eyes:  Negative for visual disturbance.  Respiratory:  Positive for wheezing. Negative for cough and shortness of breath.   Cardiovascular:  Positive for leg swelling. Negative for chest pain.  Gastrointestinal:  Negative for abdominal distention and abdominal pain.  Genitourinary:  Negative for dysuria.  Musculoskeletal:  Positive for gait problem.  Neurological:  Positive for weakness. Negative for dizziness and headaches.  Psychiatric/Behavioral:  Negative for dysphoric mood. The patient is not nervous/anxious.     Immunization History  Administered Date(s) Administered   Covid-19, Mrna,Vaccine(Spikevax)50yrs and older 04/10/2022   Influenza Split 02/24/2010, 02/24/2014   Influenza, High Dose Seasonal PF 04/07/2015, 03/27/2018   Influenza,inj,Quad PF,6+ Mos 02/21/2016, 04/04/2017   Influenza-Unspecified 04/19/2012, 03/26/2013, 02/14/2019   Moderna SARS-COV2 Booster Vaccination 10/05/2021   Moderna Sars-Covid-2 Vaccination 06/15/2019, 07/18/2019, 04/28/2020   PNEUMOCOCCAL CONJUGATE-20 08/09/2021, 08/14/2022   Pneumococcal Conjugate-13 08/19/2013   Tdap 08/11/2011   Zoster Recombinat (Shingrix) 11/18/2020, 03/10/2022   Zoster, Live 11/18/2020, 03/07/2021   Pertinent  Health Maintenance Due  Topic Date Due   INFLUENZA VACCINE  01/04/2023   DEXA SCAN  Completed      01/08/2019    2:37 PM 01/08/2019    9:28 PM 01/09/2019    8:23 AM 01/09/2019   10:26 PM 01/10/2019   11:08 AM  Fall Risk  (RETIRED) Patient Fall Risk Level Low fall risk High fall risk High fall risk High fall risk High fall risk    Functional Status Survey:    Vitals:   10/24/22 0919  BP: (!) 114/58  Pulse: 60  Resp: 16  Temp: 98.8 F (37.1 C)  SpO2: 94%  Weight: 146 lb 11.2 oz (66.5 kg)  Height: 5\' 3"  (1.6 m)   Body mass index is 25.99 kg/m. Physical Exam Vitals reviewed.  Constitutional:      General: She is not in acute distress.    Appearance: She is not ill-appearing.  HENT:     Head: Normocephalic.     Right Ear: There is no impacted cerumen.     Left Ear: There is no impacted cerumen.     Nose: Nose normal.     Mouth/Throat:     Mouth: Mucous membranes are moist.  Eyes:     General:        Right eye: No discharge.        Left eye: No discharge.  Cardiovascular:     Rate and Rhythm: Normal rate. Rhythm irregular.     Pulses: Normal pulses.     Heart sounds: Normal heart sounds.  Pulmonary:     Effort: Pulmonary effort is normal. No respiratory distress.     Breath sounds: Examination of the left-upper field reveals wheezing. Examination of the right-middle field reveals wheezing and rales. Examination of the left-middle field reveals wheezing and rales. Wheezing and rales present.     Comments: Expiratory wheezing Abdominal:     General: Bowel sounds are normal.     Palpations: Abdomen is soft.  Musculoskeletal:        General: Normal range of motion.     Cervical back: Neck supple.     Right lower leg: Edema present.     Left lower leg: Edema present.     Comments: Non pitting  Lymphadenopathy:     Cervical: No cervical adenopathy.  Skin:    General: Skin is warm.     Capillary Refill: Capillary refill takes less than 2 seconds.  Neurological:     General: No focal deficit present.     Mental Status: She is alert. Mental status is at baseline.     Motor: Weakness present.     Gait: Gait abnormal.  Psychiatric:        Mood and Affect: Mood normal.     Labs reviewed: Recent Labs    09/04/22 0750 09/05/22 0556 09/06/22 0540 09/07/22 0915 09/14/22 0000  NA 134* 137  137  137 137  K 3.8 4.1 4.0 4.0 4.6  CL 106 110 110 104 99  CO2 22 22 23 25  29*  GLUCOSE 75 77 85 101*  --   BUN 14 15 18 18  23*  CREATININE 1.15* 1.16* 1.26* 1.26* 1.3*  CALCIUM 8.4* 7.9* 8.6* 8.7* 8.8  PHOS 2.5 3.4 3.0  --   --    Recent Labs    09/02/22 1137 09/03/22 0642 09/04/22 0750 09/05/22 0556 09/06/22 0540 09/14/22 0000  AST 329* 239* 208*  --   --  61*  ALT 286* 240* 220*  --   --  65*  ALKPHOS 166* 151* 161*  --   --  223*  BILITOT 0.8 0.9 1.0  --   --   --   PROT 6.4* 5.5* 6.1*  --   --   --   ALBUMIN 2.9* 2.3* 2.5*  2.5* 2.1* 2.4* 3.0*   Recent Labs    09/01/22 2037 09/02/22 0713 09/03/22 0642 09/07/22 0915 09/14/22 0000 09/19/22 0000  WBC 7.5 8.7 7.8 8.3 10.4 12.0  NEUTROABS 5.1  --  5.2 5.3  --   --   HGB 12.6 12.1 11.8* 12.6 12.0 12.2  HCT 38.4 36.1 35.0* 38.8 35* 36  MCV 96.2 95.0 95.6 97.7  --   --   PLT 173 194 PLATELET CLUMPS NOTED ON SMEAR, UNABLE TO ESTIMATE 144*  --  130*   Lab Results  Component Value Date   TSH 3.14 09/28/2022   No results found for: "HGBA1C" Lab Results  Component Value Date   CHOL 128 12/13/2018   HDL 79 12/13/2018   LDLCALC 36 12/13/2018   TRIG 63 12/13/2018   CHOLHDL 1.6 12/13/2018    Significant Diagnostic Results in last 30 days:  No results found.  Assessment/Plan 1. Wheezing - onset x 2 days - no relief with Duonebs BID - expiratory wheezing to upper/middle lobes - afebrile, sats > 90% off oxygen - CXR to r/o aspiration, effusion - start prednisone 40 mg x 2 days, then 20 mg x 5  2. Pharyngeal dysphagia - no recent aspirations  - hospitalized 03/29-04/04 for aspiration pneumonia - cont mechanical soft diet - see above   3. Chronic diastolic heart failure (HCC) - 04/04 2D echo LVEF 70-75% - CXR with effusion last hospitalization - no weight fluctuations, sob, some non pitting edema - cont furosemide daily - see above  4. Pleural effusion on right - 05/21 CXR reports CHF and right  effusion - Give furosemide 40 mg po once now - increase scheduled to furosemide to 40 mg po BID x 2 days - BMP and BNP 10/26/2022     Family/ staff Communication: plan discussed with patient and nurse  Labs/tests ordered:   CXR

## 2022-10-25 DIAGNOSIS — M6259 Muscle wasting and atrophy, not elsewhere classified, multiple sites: Secondary | ICD-10-CM | POA: Diagnosis not present

## 2022-10-25 DIAGNOSIS — J690 Pneumonitis due to inhalation of food and vomit: Secondary | ICD-10-CM | POA: Diagnosis not present

## 2022-10-26 ENCOUNTER — Non-Acute Institutional Stay (SKILLED_NURSING_FACILITY): Payer: Medicare Other | Admitting: Adult Health

## 2022-10-26 ENCOUNTER — Encounter: Payer: Self-pay | Admitting: Adult Health

## 2022-10-26 DIAGNOSIS — K449 Diaphragmatic hernia without obstruction or gangrene: Secondary | ICD-10-CM | POA: Diagnosis not present

## 2022-10-26 DIAGNOSIS — K219 Gastro-esophageal reflux disease without esophagitis: Secondary | ICD-10-CM | POA: Diagnosis not present

## 2022-10-26 DIAGNOSIS — J4541 Moderate persistent asthma with (acute) exacerbation: Secondary | ICD-10-CM

## 2022-10-26 DIAGNOSIS — M6259 Muscle wasting and atrophy, not elsewhere classified, multiple sites: Secondary | ICD-10-CM | POA: Diagnosis not present

## 2022-10-26 DIAGNOSIS — J690 Pneumonitis due to inhalation of food and vomit: Secondary | ICD-10-CM | POA: Diagnosis not present

## 2022-10-26 DIAGNOSIS — I5022 Chronic systolic (congestive) heart failure: Secondary | ICD-10-CM | POA: Diagnosis not present

## 2022-10-26 DIAGNOSIS — I5033 Acute on chronic diastolic (congestive) heart failure: Secondary | ICD-10-CM | POA: Diagnosis not present

## 2022-10-26 LAB — BASIC METABOLIC PANEL
BUN: 31 — AB (ref 4–21)
CO2: 27 — AB (ref 13–22)
Chloride: 94 — AB (ref 99–108)
Creatinine: 1.6 — AB (ref 0.5–1.1)
Glucose: 82
Potassium: 4.3 mEq/L (ref 3.5–5.1)
Sodium: 132 — AB (ref 137–147)

## 2022-10-26 LAB — COMPREHENSIVE METABOLIC PANEL: Calcium: 8.8 (ref 8.7–10.7)

## 2022-10-26 MED ORDER — FUROSEMIDE 40 MG PO TABS: 40.0000 mg | ORAL_TABLET | Freq: Every day | ORAL | 3 refills | Status: DC

## 2022-10-26 MED ORDER — POTASSIUM CHLORIDE CRYS ER 10 MEQ PO TBCR: 10.0000 meq | EXTENDED_RELEASE_TABLET | Freq: Every day | ORAL | 0 refills | Status: DC

## 2022-10-26 MED ORDER — BUDESONIDE 0.5 MG/2ML IN SUSP
0.5000 mg | Freq: Two times a day (BID) | RESPIRATORY_TRACT | 0 refills | Status: DC
Start: 2022-10-26 — End: 2023-05-29

## 2022-10-26 MED ORDER — ARFORMOTEROL TARTRATE 15 MCG/2ML IN NEBU
15.0000 ug | INHALATION_SOLUTION | Freq: Two times a day (BID) | RESPIRATORY_TRACT | 0 refills | Status: DC
Start: 2022-10-26 — End: 2023-05-29

## 2022-10-26 NOTE — Progress Notes (Signed)
Location:  [facility] Room Number: 112A Place of Service:  SNF [phone]) Provider:  Tamsen Roers, MD  Patient Care Team: Mahlon Gammon, MD as PCP - General (Internal Medicine) Jake Bathe, MD as PCP - Cardiology (Cardiology)  Extended Emergency Contact Information Primary Emergency Contact: Buehler,Sarah Address: [address]          [place], [place] [address] Darden Amber of [place] Phone: [phone] Relation: Daughter Secondary Emergency Contact: Merrily Pew Address: [address]          [place], [place] [address] Darden Amber of [place] Home Phone: [phone] Mobile Phone: [phone] Relation: Spouse  Code Status:  DNR Palliative Goals of care: Advanced Directive information    10/26/2022   10:55 AM  Advanced Directives  Does Patient Have a Medical Advance Directive? Yes  Type of Estate agent of [place];Living will;Out of facility DNR (pink MOST or yellow form)  Does patient want to make changes to medical advance directive? No - Patient declined  Copy of Healthcare Power of Attorney in Chart? Yes - validated most recent copy scanned in chart (See row information)     Chief Complaint  Patient presents with   Acute Visit    Patient is being seen for CHF    HPI:  Pt is a 89 y.o. female seen today for an acute visit for f/u wheezing/CHF  PMH permanent afib, off eliquis due to GIB with cameron erosions on EGD 01/09/19, breast ca, also with hypothyroidism, CKD III, CHF with HF pEF, HTN  She was in the hospital and treated for pna 09/01/22-09/07/22. On 4/1 had an esophgram with limited exam with possible distal esophageal stricture, and moderate to large hiatal hernia.   Seen by [name] NP 5/21 for wheezing sats were >90%  CXR showed CHF with small right effusion. She was given prednisone and increased lasix dosing. Slightly improved today 10/26/22 but continues to appear sob  on exertion and have wheezing.   10/26/22: BNP 524, BUN 31.1 Cr 1.57 Na 132 K 4.3   2D echo reviewed 09/07/22  1. Left ventricular ejection fraction, by estimation, is 70 to 75%. The  left ventricle has hyperdynamic function. The left ventricle has no  regional wall motion abnormalities. Left ventricular diastolic parameters  are consistent with Grade II diastolic  dysfunction (pseudonormalization)  Past Medical History:  Diagnosis Date   Actinic keratoses    Arthritis    back    Atrial fibrillation with RVR (HCC)    Breast cancer (HCC)    Breast cancer (right)   Chronic diastolic CHF (congestive heart failure) (HCC)    CKD (chronic kidney disease), stage III (HCC)    Compression fracture of L3 lumbar vertebra    Dysphagia    GERD (gastroesophageal reflux disease)    takes otc Pepcid as needed   Hiatal hernia 11/27/2014   HTN (hypertension)    Hypothyroidism    Insomnia    Osteopenia    PAF (paroxysmal atrial fibrillation) (HCC)    a. questionable episode in 2015 in [place]. b. event monitor through December/January 2014/2015 which showed no evidence of atrial fibrillation. c. Dx 12/2018.   PAT (paroxysmal atrial tachycardia)    Pleural effusion    Past Surgical History:  Procedure Laterality Date   BREAST LUMPECTOMY Right 1995   lumpectomy   COLONOSCOPY     ESOPHAGOGASTRODUODENOSCOPY (EGD) WITH PROPOFOL Left 01/09/2019   Procedure: ESOPHAGOGASTRODUODENOSCOPY (EGD) WITH PROPOFOL;  Surgeon: Elnoria Howard,  Luisa Hart, MD;  Location: [facility];  Service: Endoscopy;  Laterality: Left;   EYE SURGERY Bilateral    cataract w/ lens implant   KYPHOPLASTY N/A 09/18/2013   Procedure: KYPHOPLASTY;  Surgeon: Emilee Hero, MD;  Location: [facility] OR;  Service: Orthopedics;  Laterality: N/A;  Lumbar 3 kyphoplasty   TONSILLECTOMY      Allergies  Allergen Reactions   Cardizem [Diltiazem]    Eliquis [Apixaban]    Tape Other (See Comments)    Patient prefers easy-release tape    Outpatient  Encounter Medications as of 10/26/2022  Medication Sig   acetaminophen (TYLENOL) 500 MG tablet Take 2 tablets (1,000 mg total) by mouth every 8 (eight) hours.   B Complex-C (B-COMPLEX WITH VITAMIN C) tablet Take 1 tablet by mouth daily.   benzonatate (TESSALON) 100 MG capsule Take 100 mg by mouth 3 (three) times daily as needed for cough.   Cholecalciferol (VITAMIN D3) 2000 units TABS Take 2,000 Units by mouth daily.    docusate sodium (COLACE) 100 MG capsule Take 100 mg by mouth daily.   Ensure (ENSURE) Take 237 mLs by mouth 2 (two) times daily between meals.   furosemide (LASIX) 20 MG tablet Take 2 tablets (40 mg total) by mouth 2 (two) times daily.   guaiFENesin (MUCINEX) 600 MG 12 hr tablet Take 600 mg by mouth in the morning.   guaiFENesin (MUCINEX) 600 MG 12 hr tablet Take 600 mg by mouth daily as needed for cough.   ipratropium-albuterol (DUONEB) 0.5-2.5 (3) MG/3ML SOLN Take 3 mLs by nebulization 2 (two) times daily as needed (Wheezing/SOB).   ipratropium-albuterol (DUONEB) 0.5-2.5 (3) MG/3ML SOLN Take 3 mLs by nebulization 4 (four) times daily.   levothyroxine (SYNTHROID) 112 MCG tablet Take 112 mcg by mouth every morning.   melatonin 5 MG TABS Take 5 mg by mouth at bedtime.   nitroGLYCERIN (NITROSTAT) 0.4 MG SL tablet Place 1 tablet (0.4 mg total) under the tongue every 5 (five) minutes as needed for chest pain.   ondansetron (ZOFRAN) 4 MG tablet Take 4 mg by mouth every 6 (six) hours as needed for nausea or vomiting.   pantoprazole (PROTONIX) 40 MG tablet Take 1 tablet (40 mg total) by mouth daily.   polyethylene glycol powder (GLYCOLAX/MIRALAX) 17 GM/SCOOP powder Take 17 g by mouth daily.   predniSONE (DELTASONE) 20 MG tablet Take 2 tablets (40 mg total) by mouth daily with breakfast for 2 days, THEN 1 tablet (20 mg total) daily with breakfast for 5 days.   Probiotic Product (ALIGN) 4 MG CAPS Take 4 mg by mouth daily.   sennosides-docusate sodium (SENOKOT-S) 8.6-50 MG tablet Take 1  tablet by mouth at bedtime as needed for constipation.   metoprolol tartrate (LOPRESSOR) 25 MG tablet Take 0.5 tablets (12.5 mg total) by mouth 2 (two) times daily.   Facility-Administered Encounter Medications as of 10/26/2022  Medication   bisacodyl (DULCOLAX) suppository 10 mg    Review of Systems  Constitutional:  Negative for activity change, appetite change, chills, diaphoresis, fatigue, fever and unexpected weight change.  HENT:  Negative for congestion.   Respiratory:  Positive for cough, shortness of breath and wheezing.   Cardiovascular:  Negative for chest pain, palpitations and leg swelling.  Gastrointestinal:  Negative for abdominal distention, abdominal pain, constipation and diarrhea.  Genitourinary:  Negative for difficulty urinating and dysuria.  Musculoskeletal:  Positive for gait problem. Negative for arthralgias, back pain, joint swelling and myalgias.  Neurological:  Negative for dizziness, tremors, seizures, syncope,  facial asymmetry, speech difficulty, weakness, light-headedness, numbness and headaches.  Psychiatric/Behavioral:  Negative for agitation, behavioral problems and confusion.     Immunization History  Administered Date(s) Administered   Covid-19, Mrna,Vaccine(Spikevax)41yrs and older 04/10/2022   Influenza Split 02/24/2010, 02/24/2014   Influenza, High Dose Seasonal PF 04/07/2015, 03/27/2018   Influenza,inj,Quad PF,6+ Mos 02/21/2016, 04/04/2017   Influenza-Unspecified 04/19/2012, 03/26/2013, 02/14/2019   Moderna SARS-COV2 Booster Vaccination 10/05/2021   Moderna Sars-Covid-2 Vaccination 06/15/2019, 07/18/2019, 04/28/2020   PNEUMOCOCCAL CONJUGATE-20 08/09/2021, 08/14/2022   Pneumococcal Conjugate-13 08/19/2013   Tdap 08/11/2011, 10/25/2022   Zoster Recombinat (Shingrix) 11/18/2020, 03/10/2022   Zoster, Live 11/18/2020, 03/07/2021   Pertinent  Health Maintenance Due  Topic Date Due   INFLUENZA VACCINE  01/04/2023   DEXA SCAN  Completed       01/08/2019    9:28 PM 01/09/2019    8:23 AM 01/09/2019   10:26 PM 01/10/2019   11:08 AM 10/26/2022   10:55 AM  Fall Risk  Falls in the past year?     0  Was there an injury with Fall?     0  Fall Risk Category Calculator     0  (RETIRED) Patient Fall Risk Level High fall risk High fall risk High fall risk High fall risk   Patient at Risk for Falls Due to     No Fall Risks  Fall risk Follow up     Falls evaluation completed   Functional Status Survey:    Vitals:   10/26/22 1031  BP: (!) 158/78  Pulse: 76  Resp: 16  Temp: (!) 97.3 F (36.3 C)  TempSrc: Temporal  SpO2: 98%  Weight: 146 lb 11.2 oz (66.5 kg)  Height: 5\' 3"  (1.6 m)   Body mass index is 25.99 kg/m. Physical Exam Vitals and nursing note reviewed.  Constitutional:      General: She is not in acute distress.    Appearance: She is not diaphoretic.  HENT:     Head: Normocephalic and atraumatic.     Nose: Nose normal.     Mouth/Throat:     Mouth: Mucous membranes are moist.     Pharynx: Oropharynx is clear.  Eyes:     Conjunctiva/sclera: Conjunctivae normal.     Pupils: Pupils are equal, round, and reactive to light.  Neck:     Vascular: No JVD.  Cardiovascular:     Rate and Rhythm: Normal rate and regular rhythm.     Heart sounds: No murmur heard. Pulmonary:     Effort: Pulmonary effort is normal. No respiratory distress.     Breath sounds: Wheezing present.  Abdominal:     General: Bowel sounds are normal. There is no distension.     Palpations: Abdomen is soft.     Tenderness: There is no abdominal tenderness.  Musculoskeletal:     Comments: BLE edema non pitting   Skin:    General: Skin is warm and dry.  Neurological:     Mental Status: She is alert and oriented to person, place, and time.     Labs reviewed: Recent Labs    09/04/22 0750 09/05/22 0556 09/06/22 0540 09/07/22 0915 09/14/22 0000  NA 134* 137 137 137 137  K 3.8 4.1 4.0 4.0 4.6  CL 106 110 110 104 99  CO2 22 22 23 25  29*  GLUCOSE  75 77 85 101*  --   BUN 14 15 18 18  23*  CREATININE 1.15* 1.16* 1.26* 1.26* 1.3*  CALCIUM 8.4* 7.9* 8.6* 8.7*  8.8  PHOS 2.5 3.4 3.0  --   --    Recent Labs    09/02/22 1137 09/03/22 0642 09/04/22 0750 09/05/22 0556 09/06/22 0540 09/14/22 0000  AST 329* 239* 208*  --   --  61*  ALT 286* 240* 220*  --   --  65*  ALKPHOS 166* 151* 161*  --   --  223*  BILITOT 0.8 0.9 1.0  --   --   --   PROT 6.4* 5.5* 6.1*  --   --   --   ALBUMIN 2.9* 2.3* 2.5*  2.5* 2.1* 2.4* 3.0*   Recent Labs    09/01/22 2037 09/02/22 0713 09/03/22 0642 09/07/22 0915 09/14/22 0000 09/19/22 0000  WBC 7.5 8.7 7.8 8.3 10.4 12.0  NEUTROABS 5.1  --  5.2 5.3  --   --   HGB 12.6 12.1 11.8* 12.6 12.0 12.2  HCT 38.4 36.1 35.0* 38.8 35* 36  MCV 96.2 95.0 95.6 97.7  --   --   PLT 173 194 PLATELET CLUMPS NOTED ON SMEAR, UNABLE TO ESTIMATE 144*  --  130*   Lab Results  Component Value Date   TSH 3.14 09/28/2022   No results found for: "HGBA1C" Lab Results  Component Value Date   CHOL 128 12/13/2018   HDL 79 12/13/2018   LDLCALC 36 12/13/2018   TRIG 63 12/13/2018   CHOLHDL 1.6 12/13/2018    Significant Diagnostic Results in last 30 days:  No results found.  Assessment/Plan  1. Acute on chronic diastolic congestive heart failure (HCC) Elevated BNP worse than baseline and xray findings c/w CHF BP and weight slightly elevated.  Completed lasix bid then start 40 mg daily BMP 1 week    2. Moderate persistent asthma with exacerbation ?cardiac asthma Has recurrent wheezing with hx of "reactive airway disease" Complete prednisone Start brovana and pulmicort, change duoneb to q 6 prn Rinse mouth after use.   - arformoterol (BROVANA) 15 MCG/2ML NEBU; Take 2 mLs (15 mcg total) by nebulization in the morning and at bedtime.  Dispense: 120 mL; Refill: 0 - budesonide (PULMICORT) 0.5 MG/2ML nebulizer solution; Take 2 mLs (0.5 mg total) by nebulization in the morning and at bedtime.  Dispense: 30 mL; Refill:  0  3. Hiatal hernia with GERD On protonix May contribute to symptoms.    Family/ staff Communication: Discussed with her daughter Malen Gauze ordered:  BMP 1 week

## 2022-10-27 DIAGNOSIS — J690 Pneumonitis due to inhalation of food and vomit: Secondary | ICD-10-CM | POA: Diagnosis not present

## 2022-10-27 DIAGNOSIS — M6259 Muscle wasting and atrophy, not elsewhere classified, multiple sites: Secondary | ICD-10-CM | POA: Diagnosis not present

## 2022-10-30 DIAGNOSIS — J690 Pneumonitis due to inhalation of food and vomit: Secondary | ICD-10-CM | POA: Diagnosis not present

## 2022-10-30 DIAGNOSIS — M6259 Muscle wasting and atrophy, not elsewhere classified, multiple sites: Secondary | ICD-10-CM | POA: Diagnosis not present

## 2022-10-30 DIAGNOSIS — R2689 Other abnormalities of gait and mobility: Secondary | ICD-10-CM | POA: Diagnosis not present

## 2022-10-31 DIAGNOSIS — M6259 Muscle wasting and atrophy, not elsewhere classified, multiple sites: Secondary | ICD-10-CM | POA: Diagnosis not present

## 2022-10-31 DIAGNOSIS — J690 Pneumonitis due to inhalation of food and vomit: Secondary | ICD-10-CM | POA: Diagnosis not present

## 2022-11-01 ENCOUNTER — Non-Acute Institutional Stay: Payer: Medicare Other | Admitting: Family Medicine

## 2022-11-01 VITALS — BP 120/70 | HR 59 | Temp 96.8°F | Resp 18

## 2022-11-01 DIAGNOSIS — J690 Pneumonitis due to inhalation of food and vomit: Secondary | ICD-10-CM | POA: Diagnosis not present

## 2022-11-01 DIAGNOSIS — R1312 Dysphagia, oropharyngeal phase: Secondary | ICD-10-CM

## 2022-11-01 DIAGNOSIS — R2689 Other abnormalities of gait and mobility: Secondary | ICD-10-CM | POA: Diagnosis not present

## 2022-11-01 DIAGNOSIS — J9601 Acute respiratory failure with hypoxia: Secondary | ICD-10-CM

## 2022-11-01 DIAGNOSIS — R531 Weakness: Secondary | ICD-10-CM

## 2022-11-01 DIAGNOSIS — M6259 Muscle wasting and atrophy, not elsewhere classified, multiple sites: Secondary | ICD-10-CM | POA: Diagnosis not present

## 2022-11-01 DIAGNOSIS — I5032 Chronic diastolic (congestive) heart failure: Secondary | ICD-10-CM

## 2022-11-01 NOTE — Progress Notes (Signed)
Therapist, nutritional Palliative Care Consult Note Telephone: [phone]  Fax: [phone]   Date of encounter: 11/01/22 4:09 PM PATIENT NAME: Cassandra Ray [address] [place] [address]   [phone] (home)  DOB: [birth year] MRN: [id number] PRIMARY CARE PROVIDER:    Mahlon Gammon, MD,  [address] [place] [address] [phone]  REFERRING PROVIDER:   Mahlon Gammon, MD [address] [place],  [place] [address] [phone]  Emergency Contacts:    Contact Information     Name Relation Home Work Mobile   [place] Daughter   [phone]   Tuesday, Silvis [phone]  [phone]   Oksana, Alles [phone]         I met face to face with patient in [facility] facility with caregiver [place] in attendance. Palliative Care was asked to follow this patient by consultation request of Mahlon Gammon, MD to address advance care planning and complex medical decision making. This is an initial visit.  CODE STATUS: DNR   ASSESSMENT AND / RECOMMENDATIONS:  PPS: 40%  Chronic Heart Failure with Preserved EF Increased dyspnea, cough and wheezing with localized edema. Treated with Lasix and improved. Continue Lasix 40 mg daily with potassium replacement, monitor daily weights and edema. Continue 12.5 mg Lopressor BID.  2.  Oropharyngeal Dysphagia Alternate sips and bite. Continue dysphagia 3 diet and meds whole in puree. Use chin tuck. Continue with ST and safe swallow strategies (upright and alert for all intake, remain upright for 45-60 minutes after eating)  3.  Weakness Continue working with PT with use of DME as recommended.  4. Acute Hypoxic Respiratory Failure Resolved, off O2. Recent episode of wheezing treated with Prednisone course and noted improvement. Continue DuoNeb nebulizer treatments, Guaifenesin and Tessalon perles as prescribed.   Follow up Palliative  Care Visit:  Palliative Care continuing to follow up by monitoring for changes in appetite, weight, functional and cognitive status for chronic disease progression and management in agreement with patient's stated goals of care. Next visit in 2-3 weeks or prn.  This visit was coded based on medical decision making (MDM).  Chief Complaint  Palliative Care is continuing to follow up with patient for chronic disease management in setting of chronic heart failure with preserved EF and recent respiratory exacerbation responsive to steroids.  Palliative Care is also following to assist with defining/refining goals of care.   HISTORY OF PRESENT ILLNESS: Cassandra Ray is a 89 y.o. year old female with chronic heart failure with preserved EF, afb and possible early cognitive deficit. Pt normally resides with spouse in ALF at [facility].  Off Amiodaraone since hospitalization, not on anticoagulation due to GI bleed. Denies CP, SOB above baseline.  Right ankle swells a little "always".  Denies orthopnea, PND, nausea, vomiting, dysuria, constipation.  Has been weaned off O2.  Had recent exacerbation with increased wheezing but was maintaining sats per staff in the mid 90s.  She was given Prednisone and dose of Lasix was briefly increased and she has steadily improved.  She was ambulatory well once up with PT, had trouble getting up from a full seated position without a lift.  Denies falls, eating 75-100% of meals.  Denies difficulty coping and sleeps well. She is drinking some Boost daily. Denies bleeding. On regular chopped with thin.  Crushed meds in pudding   ACTIVITIES OF DAILY LIVING: CONTINENT OF BLADDER/BOWEL? Yes BATHING/DRESSING 1 person assist FEEDING-independent  MOBILITY:   AMBULATORY with walker?  Yes, with lift to assist to stand.  APPETITE? Good, eating 75-100% of most meals  Weight 142 lbs as of 09/07/22, current weight 146 lbs 11.2 oz as of 10/26/22  CURRENT PROBLEM LIST:  Patient Active  Problem List   Diagnosis Date Noted   Oropharyngeal dysphagia 09/17/2022   Palliative care by specialist 09/06/2022   Left lower lobe pneumonia 09/02/2022   Transaminitis 09/02/2022   CKD (chronic kidney disease), stage IV (HCC) 09/02/2022   CAP (community acquired pneumonia) 09/02/2022   Chronic kidney disease, stage III (moderate) (HCC) 08/09/2021   Upper GI bleed 01/08/2019   Paroxysmal atrial fibrillation (HCC) 12/12/2018   Acute kidney injury superimposed on chronic kidney disease (HCC) 12/12/2018   Leukocytosis 12/12/2018   Gastroesophageal reflux disease    Weakness    Tachycardia 05/24/2018   Acute respiratory failure with hypoxia (HCC) 05/23/2018   Bilateral impacted cerumen 11/22/2016   Presbycusis of both ears 11/22/2016   Pleural effusion, right 02/08/2016   Lower extremity edema 01/31/2016   Chronic heart failure with preserved ejection fraction (HFpEF) (HCC) 02/03/2015   Hyponatremia 04/25/2014   Shortness of breath    Compression fracture 09/18/2013   Knee pain 08/11/2013   Back pain 05/09/2013   Essential hypertension    Hypothyroidism    PAST MEDICAL HISTORY:  Active Ambulatory Problems    Diagnosis Date Noted   Essential hypertension    Hypothyroidism    Back pain 05/09/2013   Knee pain 08/11/2013   Compression fracture 09/18/2013   Hyponatremia 04/25/2014   Shortness of breath    Chronic heart failure with preserved ejection fraction (HFpEF) (HCC) 02/03/2015   Lower extremity edema 01/31/2016   Pleural effusion, right 02/08/2016   Bilateral impacted cerumen 11/22/2016   Presbycusis of both ears 11/22/2016   Acute respiratory failure with hypoxia (HCC) 05/23/2018   Tachycardia 05/24/2018   Paroxysmal atrial fibrillation (HCC) 12/12/2018   Acute kidney injury superimposed on chronic kidney disease (HCC) 12/12/2018   Leukocytosis 12/12/2018   Gastroesophageal reflux disease    Weakness    Upper GI bleed 01/08/2019   Chronic kidney disease, stage  III (moderate) (HCC) 08/09/2021   Left lower lobe pneumonia 09/02/2022   Transaminitis 09/02/2022   CKD (chronic kidney disease), stage IV (HCC) 09/02/2022   CAP (community acquired pneumonia) 09/02/2022   Palliative care by specialist 09/06/2022   Oropharyngeal dysphagia 09/17/2022   Resolved Ambulatory Problems    Diagnosis Date Noted   A-fib (HCC) 05/09/2013   Acute on chronic diastolic CHF (congestive heart failure) (HCC) 04/24/2014   Hypertensive urgency 04/24/2014   Acute diastolic CHF (congestive heart failure), NYHA class 3 (HCC) 04/25/2014   Acute on chronic diastolic CHF (congestive heart failure) (HCC) 02/08/2016   Atrial fibrillation with RVR (HCC)    Past Medical History:  Diagnosis Date   Actinic keratoses    Arthritis    Breast cancer (HCC)    Chronic diastolic CHF (congestive heart failure) (HCC)    CKD (chronic kidney disease), stage III (HCC)    Compression fracture of L3 lumbar vertebra    Dysphagia    GERD (gastroesophageal reflux disease)    Hiatal hernia 11/27/2014   HTN (hypertension)    Insomnia    Osteopenia    PAF (paroxysmal atrial fibrillation) (HCC)    PAT (paroxysmal atrial tachycardia)    Pleural effusion    SOCIAL HX:  Social History   Tobacco Use   Smoking status: Former   Smokeless tobacco: Never   Tobacco comments:  only in college  Substance Use Topics   Alcohol use: Yes    Alcohol/week: 7.0 standard drinks of alcohol    Types: 7 Glasses of wine per week    Comment: 1 glass of wine a day   FAMILY HX:  Family History  Problem Relation Age of Onset   CAD Mother    Diabetes Mellitus II Mother    Heart failure Mother    Heart failure Father    Prostate cancer Brother    Kidney cancer Brother    Heart disease Sister    Esophageal cancer Neg Hx    Colon cancer Neg Hx        Preferred Pharmacy: ALLERGIES:  Allergies  Allergen Reactions   Cardizem [Diltiazem]    Eliquis [Apixaban]    Tape Other (See Comments)     Patient prefers easy-release tape     PERTINENT MEDICATIONS:  Outpatient Encounter Medications as of 11/01/2022  Medication Sig   acetaminophen (TYLENOL) 500 MG tablet Take 2 tablets (1,000 mg total) by mouth every 8 (eight) hours.   arformoterol (BROVANA) 15 MCG/2ML NEBU Take 2 mLs (15 mcg total) by nebulization in the morning and at bedtime.   B Complex-C (B-COMPLEX WITH VITAMIN C) tablet Take 1 tablet by mouth daily.   benzonatate (TESSALON) 100 MG capsule Take 100 mg by mouth 3 (three) times daily as needed for cough.   budesonide (PULMICORT) 0.5 MG/2ML nebulizer solution Take 2 mLs (0.5 mg total) by nebulization in the morning and at bedtime.   Cholecalciferol (VITAMIN D3) 2000 units TABS Take 2,000 Units by mouth daily.    docusate sodium (COLACE) 100 MG capsule Take 100 mg by mouth daily.   Ensure (ENSURE) Take 237 mLs by mouth 2 (two) times daily between meals.   furosemide (LASIX) 40 MG tablet Take 1 tablet (40 mg total) by mouth daily.   guaiFENesin (MUCINEX) 600 MG 12 hr tablet Take 600 mg by mouth in the morning.   guaiFENesin (MUCINEX) 600 MG 12 hr tablet Take 600 mg by mouth daily as needed for cough.   ipratropium-albuterol (DUONEB) 0.5-2.5 (3) MG/3ML SOLN Take 3 mLs by nebulization every 6 (six) hours as needed (Wheezing/SOB).   levothyroxine (SYNTHROID) 112 MCG tablet Take 112 mcg by mouth every morning.   melatonin 5 MG TABS Take 5 mg by mouth at bedtime.   metoprolol tartrate (LOPRESSOR) 25 MG tablet Take 0.5 tablets (12.5 mg total) by mouth 2 (two) times daily.   nitroGLYCERIN (NITROSTAT) 0.4 MG SL tablet Place 1 tablet (0.4 mg total) under the tongue every 5 (five) minutes as needed for chest pain.   ondansetron (ZOFRAN) 4 MG tablet Take 4 mg by mouth every 6 (six) hours as needed for nausea or vomiting.   pantoprazole (PROTONIX) 40 MG tablet Take 1 tablet (40 mg total) by mouth daily.   polyethylene glycol powder (GLYCOLAX/MIRALAX) 17 GM/SCOOP powder Take 17 g by mouth  daily.   potassium chloride (KLOR-CON M) 10 MEQ tablet Take 1 tablet (10 mEq total) by mouth daily.   Probiotic Product (ALIGN) 4 MG CAPS Take 4 mg by mouth daily.   sennosides-docusate sodium (SENOKOT-S) 8.6-50 MG tablet Take 1 tablet by mouth at bedtime as needed for constipation.   Facility-Administered Encounter Medications as of 11/01/2022  Medication   bisacodyl (DULCOLAX) suppository 10 mg    History obtained from review of EMR, discussion with primary team, and interview with private caregiver, facility staff/caregiver and/or patient.   BMP: Ref Range &  Units 3 wk ago (10/26/22) 2 mo ago (09/14/22) 2 mo ago (09/07/22) 2 mo ago (09/06/22) 2 mo ago (09/05/22) 2 mo ago (09/04/22) 2 mo ago (09/03/22)  Glucose 82 74 101 High  R, CM 85 R, CM 77 R, CM 75 R, CM 89 R, CM  BUN 4 - 21 31 Abnormal  23 Abnormal  18 R 18 R 15 R 14 R 18 R  CO2 13 - 22 27 Abnormal  29 Abnormal  25 R 23 R 22 R 22 R 23 R  Creatinine 0.5 - 1.1 1.6 Abnormal  1.3 Abnormal  1.26 High  R 1.26 High  R 1.16 High  R 1.15 High  R 1.31 High  R  Potassium 3.5 - 5.1 mEq/L 4.3 4.6 4.0 R 4.0 R 4.1 R 3.8 R 3.8 R  Sodium 137 - 147 132 Abnormal  137 137 R 137 R 137 R 134 Low  R 130 Low  R  Chloride 99 - 108 94 Abnormal  99 104 R 110 R 110 R 106 R 101 R     I reviewed available labs, medications, imaging, studies and related documents from the EMR.  Records reviewed and summarized above.   Home Health Caregiver:  [place]  Physical Exam: GENERAL: NAD EENT:  HOH even with hearing aids LUNGS: CTA except in BLL with fine crackles, no O2 sat CARDIAC:  S1S2, IRIR with no MRG, trace RLE ankle edema/none on left, No cyanosis ABD:  Normo-active BS x 4 quads, soft, non-tender EXTREMITIES: Normal ROM, no deformity,  No muscle atrophy/subcutaneous fat loss NEURO:  Noted generalized weakness or cognitive impairment PSYCH:  non-anxious affect, A & O x 3  Thank you for the opportunity to participate in the care of Cohen Piech.  Please call our main office at [phone] if we can be of additional assistance.    Joycelyn Man FNP-C  [facility]  Phone:  [phone]

## 2022-11-02 DIAGNOSIS — M6259 Muscle wasting and atrophy, not elsewhere classified, multiple sites: Secondary | ICD-10-CM | POA: Diagnosis not present

## 2022-11-02 DIAGNOSIS — I5022 Chronic systolic (congestive) heart failure: Secondary | ICD-10-CM | POA: Diagnosis not present

## 2022-11-02 DIAGNOSIS — J690 Pneumonitis due to inhalation of food and vomit: Secondary | ICD-10-CM | POA: Diagnosis not present

## 2022-11-02 LAB — BASIC METABOLIC PANEL
BUN: 29 — AB (ref 4–21)
CO2: 26 — AB (ref 13–22)
Chloride: 100 (ref 99–108)
Creatinine: 1.7 — AB (ref 0.5–1.1)
Glucose: 76
Potassium: 4.6 mEq/L (ref 3.5–5.1)
Sodium: 137 (ref 137–147)

## 2022-11-02 LAB — COMPREHENSIVE METABOLIC PANEL: Calcium: 8.9 (ref 8.7–10.7)

## 2022-11-06 DIAGNOSIS — J690 Pneumonitis due to inhalation of food and vomit: Secondary | ICD-10-CM | POA: Diagnosis not present

## 2022-11-06 DIAGNOSIS — M6259 Muscle wasting and atrophy, not elsewhere classified, multiple sites: Secondary | ICD-10-CM | POA: Diagnosis not present

## 2022-11-07 DIAGNOSIS — M6259 Muscle wasting and atrophy, not elsewhere classified, multiple sites: Secondary | ICD-10-CM | POA: Diagnosis not present

## 2022-11-07 DIAGNOSIS — J690 Pneumonitis due to inhalation of food and vomit: Secondary | ICD-10-CM | POA: Diagnosis not present

## 2022-11-08 DIAGNOSIS — M6259 Muscle wasting and atrophy, not elsewhere classified, multiple sites: Secondary | ICD-10-CM | POA: Diagnosis not present

## 2022-11-08 DIAGNOSIS — J690 Pneumonitis due to inhalation of food and vomit: Secondary | ICD-10-CM | POA: Diagnosis not present

## 2022-11-09 DIAGNOSIS — R2689 Other abnormalities of gait and mobility: Secondary | ICD-10-CM | POA: Diagnosis not present

## 2022-11-09 DIAGNOSIS — J690 Pneumonitis due to inhalation of food and vomit: Secondary | ICD-10-CM | POA: Diagnosis not present

## 2022-11-09 DIAGNOSIS — M6259 Muscle wasting and atrophy, not elsewhere classified, multiple sites: Secondary | ICD-10-CM | POA: Diagnosis not present

## 2022-11-10 DIAGNOSIS — J690 Pneumonitis due to inhalation of food and vomit: Secondary | ICD-10-CM | POA: Diagnosis not present

## 2022-11-10 DIAGNOSIS — M6259 Muscle wasting and atrophy, not elsewhere classified, multiple sites: Secondary | ICD-10-CM | POA: Diagnosis not present

## 2022-11-13 DIAGNOSIS — R2689 Other abnormalities of gait and mobility: Secondary | ICD-10-CM | POA: Diagnosis not present

## 2022-11-13 DIAGNOSIS — M6259 Muscle wasting and atrophy, not elsewhere classified, multiple sites: Secondary | ICD-10-CM | POA: Diagnosis not present

## 2022-11-13 DIAGNOSIS — J690 Pneumonitis due to inhalation of food and vomit: Secondary | ICD-10-CM | POA: Diagnosis not present

## 2022-11-16 DIAGNOSIS — M6259 Muscle wasting and atrophy, not elsewhere classified, multiple sites: Secondary | ICD-10-CM | POA: Diagnosis not present

## 2022-11-16 DIAGNOSIS — J9601 Acute respiratory failure with hypoxia: Secondary | ICD-10-CM | POA: Diagnosis not present

## 2022-11-16 DIAGNOSIS — J690 Pneumonitis due to inhalation of food and vomit: Secondary | ICD-10-CM | POA: Diagnosis not present

## 2022-11-16 DIAGNOSIS — R2689 Other abnormalities of gait and mobility: Secondary | ICD-10-CM | POA: Diagnosis not present

## 2022-11-16 DIAGNOSIS — R1312 Dysphagia, oropharyngeal phase: Secondary | ICD-10-CM | POA: Diagnosis not present

## 2022-11-17 DIAGNOSIS — R1312 Dysphagia, oropharyngeal phase: Secondary | ICD-10-CM | POA: Diagnosis not present

## 2022-11-17 DIAGNOSIS — M6259 Muscle wasting and atrophy, not elsewhere classified, multiple sites: Secondary | ICD-10-CM | POA: Diagnosis not present

## 2022-11-17 DIAGNOSIS — J690 Pneumonitis due to inhalation of food and vomit: Secondary | ICD-10-CM | POA: Diagnosis not present

## 2022-11-17 DIAGNOSIS — R2689 Other abnormalities of gait and mobility: Secondary | ICD-10-CM | POA: Diagnosis not present

## 2022-11-17 DIAGNOSIS — J9601 Acute respiratory failure with hypoxia: Secondary | ICD-10-CM | POA: Diagnosis not present

## 2022-11-20 DIAGNOSIS — J690 Pneumonitis due to inhalation of food and vomit: Secondary | ICD-10-CM | POA: Diagnosis not present

## 2022-11-20 DIAGNOSIS — M6259 Muscle wasting and atrophy, not elsewhere classified, multiple sites: Secondary | ICD-10-CM | POA: Diagnosis not present

## 2022-11-21 ENCOUNTER — Non-Acute Institutional Stay (SKILLED_NURSING_FACILITY): Payer: Medicare Other | Admitting: Orthopedic Surgery

## 2022-11-21 ENCOUNTER — Encounter: Payer: Self-pay | Admitting: Orthopedic Surgery

## 2022-11-21 ENCOUNTER — Encounter: Payer: Self-pay | Admitting: Family Medicine

## 2022-11-21 DIAGNOSIS — J4541 Moderate persistent asthma with (acute) exacerbation: Secondary | ICD-10-CM

## 2022-11-21 DIAGNOSIS — R1313 Dysphagia, pharyngeal phase: Secondary | ICD-10-CM | POA: Diagnosis not present

## 2022-11-21 DIAGNOSIS — I5033 Acute on chronic diastolic (congestive) heart failure: Secondary | ICD-10-CM | POA: Diagnosis not present

## 2022-11-21 DIAGNOSIS — K219 Gastro-esophageal reflux disease without esophagitis: Secondary | ICD-10-CM

## 2022-11-21 DIAGNOSIS — J9601 Acute respiratory failure with hypoxia: Secondary | ICD-10-CM | POA: Diagnosis not present

## 2022-11-21 DIAGNOSIS — K449 Diaphragmatic hernia without obstruction or gangrene: Secondary | ICD-10-CM | POA: Diagnosis not present

## 2022-11-21 DIAGNOSIS — H6121 Impacted cerumen, right ear: Secondary | ICD-10-CM

## 2022-11-21 DIAGNOSIS — N1832 Chronic kidney disease, stage 3b: Secondary | ICD-10-CM

## 2022-11-21 DIAGNOSIS — I10 Essential (primary) hypertension: Secondary | ICD-10-CM

## 2022-11-21 DIAGNOSIS — R1312 Dysphagia, oropharyngeal phase: Secondary | ICD-10-CM | POA: Diagnosis not present

## 2022-11-21 DIAGNOSIS — E039 Hypothyroidism, unspecified: Secondary | ICD-10-CM | POA: Diagnosis not present

## 2022-11-21 DIAGNOSIS — D692 Other nonthrombocytopenic purpura: Secondary | ICD-10-CM | POA: Diagnosis not present

## 2022-11-21 DIAGNOSIS — J690 Pneumonitis due to inhalation of food and vomit: Secondary | ICD-10-CM | POA: Diagnosis not present

## 2022-11-21 DIAGNOSIS — M6259 Muscle wasting and atrophy, not elsewhere classified, multiple sites: Secondary | ICD-10-CM | POA: Diagnosis not present

## 2022-11-21 NOTE — Progress Notes (Signed)
[facility] Room Number: 112-A Place of Service:  SNF [phone]) Provider:  Hazle Nordmann, NP  PCP: Mahlon Gammon, MD  Patient Care Team: Mahlon Gammon, MD as PCP - General (Internal Medicine) Jake Bathe, MD as PCP - Cardiology (Cardiology)  Extended Emergency Contact Information Primary Emergency Contact: Toth,Sarah Address: [address]          [place], [place] [address] Darden Amber of [place] Phone: [phone] Relation: Daughter Secondary Emergency Contact: Merrily Pew Address: [address]          [place], [place] [address] Darden Amber of [place] Home Phone: [phone] Mobile Phone: [phone] Relation: Spouse  Code Status:  DNR Goals of care: Advanced Directive information    11/21/2022    9:46 AM  Advanced Directives  Does Patient Have a Medical Advance Directive? Yes  Type of Estate agent of [place];Living will;Out of facility DNR (pink MOST or yellow form)  Does patient want to make changes to medical advance directive? No - Patient declined  Copy of Healthcare Power of Attorney in Chart? Yes - validated most recent copy scanned in chart (See row information)     Chief Complaint  Patient presents with   Medical Management of Chronic Issues    Routine Visit.    HPI:  Pt is a 89 y.o. female seen today for medical management of chronic diseases.    She currently resides on the skilled nursing unit at [facility]. PMH: PAF> off anticoagulation due to camerons erosion, CFH HFpEF, HTN, CKD, recent pneumonia, dysphagia, GERD, hypothyroidism, transaminitis, and weakness.   CHF- acute exacerbation 05/21, BNP 425 (05/23), 2D echo LVEF 70-75% 09/2022, remains on furosemide/KCL Asthma- 05/23 increased wheezing> resolved with prednisone taper, remains on Brovana, pulmicort and duonebs prn GERD- hgb 12.2 09/19/2022, h/o hiatal hernia, remains on Protonix Pharyngeal dysphagia-  hospitalized 03/29-04/04 due to aspiration pneumonia> given ceftriaxone and vancomycin, EGD questioned distal esophageal stricture, followed by ST, remains on DYS3/chopped diet HTN- BUN/creat 29/1.71 11/02/2022, remains on metoprolol CKD- GFR 27 (05/30)> was 27 (05/23) Hypothyroidism- TSH 3.14, remains on levothyroxine  Recent blood pressures:  06/11- 10/64  06/04- 121/73, 183/85  05/21- 158/78  Recent weights:  06/01- 141.9 lbs  05/17- 146.7 lbs  5/16- 148.6 lbs        Past Medical History:  Diagnosis Date   Actinic keratoses    Arthritis    back    Atrial fibrillation with RVR (HCC)    Breast cancer (HCC)    Breast cancer (right)   Chronic diastolic CHF (congestive heart failure) (HCC)    CKD (chronic kidney disease), stage III (HCC)    Compression fracture of L3 lumbar vertebra    Dysphagia    GERD (gastroesophageal reflux disease)    takes otc Pepcid as needed   Hiatal hernia 11/27/2014   HTN (hypertension)    Hypothyroidism    Insomnia    Osteopenia    PAF (paroxysmal atrial fibrillation) (HCC)    a. questionable episode in 2015 in [place]. b. event monitor through December/January 2014/2015 which showed no evidence of atrial fibrillation. c. Dx 12/2018.   PAT (paroxysmal atrial tachycardia)    Pleural effusion    Past Surgical History:  Procedure Laterality Date   BREAST LUMPECTOMY Right 1995   lumpectomy   COLONOSCOPY     ESOPHAGOGASTRODUODENOSCOPY (EGD) WITH PROPOFOL Left 01/09/2019   Procedure: ESOPHAGOGASTRODUODENOSCOPY (EGD) WITH PROPOFOL;  Surgeon: Jeani Hawking, MD;  Location: [facility];  Service: Endoscopy;  Laterality: Left;   EYE SURGERY Bilateral    cataract w/ lens implant   KYPHOPLASTY N/A 09/18/2013   Procedure: KYPHOPLASTY;  Surgeon: Emilee Hero, MD;  Location: [facility] OR;  Service: Orthopedics;  Laterality: N/A;  Lumbar 3 kyphoplasty   TONSILLECTOMY      Allergies  Allergen Reactions   Cardizem [Diltiazem]    Eliquis [Apixaban]     Tape Other (See Comments)    Patient prefers easy-release tape    Allergies as of 11/21/2022       Reactions   Cardizem [diltiazem]    Eliquis [apixaban]    Tape Other (See Comments)   Patient prefers easy-release tape        Medication List        Accurate as of November 21, 2022  9:50 AM. If you have any questions, ask your nurse or doctor.          acetaminophen 500 MG tablet Commonly known as: TYLENOL Take 2 tablets (1,000 mg total) by mouth every 8 (eight) hours.   Align 4 MG Caps Take 4 mg by mouth daily.   arformoterol 15 MCG/2ML Nebu Commonly known as: Brovana Take 2 mLs (15 mcg total) by nebulization in the morning and at bedtime.   B-complex with vitamin C tablet Take 1 tablet by mouth daily.   benzonatate 100 MG capsule Commonly known as: TESSALON Take 100 mg by mouth 3 (three) times daily as needed for cough.   budesonide 0.5 MG/2ML nebulizer solution Commonly known as: Pulmicort Take 2 mLs (0.5 mg total) by nebulization in the morning and at bedtime.   docusate sodium 100 MG capsule Commonly known as: COLACE Take 100 mg by mouth daily.   Ensure Take 237 mLs by mouth 2 (two) times daily between meals.   furosemide 40 MG tablet Commonly known as: LASIX Take 1 tablet (40 mg total) by mouth daily.   ipratropium-albuterol 0.5-2.5 (3) MG/3ML Soln Commonly known as: DUONEB Take 3 mLs by nebulization every 6 (six) hours as needed (Wheezing/SOB).   levothyroxine 112 MCG tablet Commonly known as: SYNTHROID Take 112 mcg by mouth every morning.   melatonin 5 MG Tabs Take 5 mg by mouth at bedtime.   metoprolol tartrate 25 MG tablet Commonly known as: LOPRESSOR Take 0.5 tablets (12.5 mg total) by mouth 2 (two) times daily.   Mucinex 600 MG 12 hr tablet Generic drug: guaiFENesin Take 600 mg by mouth in the morning.   guaiFENesin 600 MG 12 hr tablet Commonly known as: MUCINEX Take 600 mg by mouth daily as needed for cough.   nitroGLYCERIN 0.4  MG SL tablet Commonly known as: NITROSTAT Place 1 tablet (0.4 mg total) under the tongue every 5 (five) minutes as needed for chest pain.   ondansetron 4 MG tablet Commonly known as: ZOFRAN Take 4 mg by mouth every 6 (six) hours as needed for nausea or vomiting.   pantoprazole 40 MG tablet Commonly known as: PROTONIX Take 1 tablet (40 mg total) by mouth daily.   polyethylene glycol powder 17 GM/SCOOP powder Commonly known as: GLYCOLAX/MIRALAX Take 17 g by mouth daily.   potassium chloride 10 MEQ tablet Commonly known as: KLOR-CON M Take 1 tablet (10 mEq total) by mouth daily.   sennosides-docusate sodium 8.6-50 MG tablet Commonly known as: SENOKOT-S Take 1 tablet by mouth at bedtime as needed for constipation.   Vitamin D3 50 MCG (2000 UT) Tabs Take 2,000 Units by mouth daily.  Review of Systems  Constitutional:  Negative for activity change, appetite change and unexpected weight change.  HENT:  Positive for hearing loss and trouble swallowing.   Eyes:  Negative for visual disturbance.  Respiratory:  Negative for cough, shortness of breath and wheezing.   Cardiovascular:  Negative for chest pain and leg swelling.  Gastrointestinal:  Negative for abdominal distention and abdominal pain.  Genitourinary:  Negative for dysuria, frequency and hematuria.  Musculoskeletal:  Positive for gait problem.  Skin:  Negative for wound.  Neurological:  Positive for weakness. Negative for dizziness and headaches.  Psychiatric/Behavioral:  Negative for dysphoric mood. The patient is not nervous/anxious.     Immunization History  Administered Date(s) Administered   Covid-19, Mrna,Vaccine(Spikevax)24yrs and older 04/10/2022   Influenza Split 02/24/2010, 02/24/2014   Influenza, High Dose Seasonal PF 04/07/2015, 03/27/2018   Influenza,inj,Quad PF,6+ Mos 02/21/2016, 04/04/2017   Influenza-Unspecified 04/19/2012, 03/26/2013, 02/14/2019   Moderna SARS-COV2 Booster Vaccination  10/05/2021   Moderna Sars-Covid-2 Vaccination 06/15/2019, 07/18/2019, 04/28/2020   PNEUMOCOCCAL CONJUGATE-20 08/09/2021, 08/14/2022   Pneumococcal Conjugate-13 08/19/2013   Tdap 08/11/2011, 10/25/2022   Zoster Recombinat (Shingrix) 11/18/2020, 03/10/2022   Zoster, Live 11/18/2020, 03/07/2021   Pertinent  Health Maintenance Due  Topic Date Due   INFLUENZA VACCINE  01/04/2023   DEXA SCAN  Completed      01/08/2019    9:28 PM 01/09/2019    8:23 AM 01/09/2019   10:26 PM 01/10/2019   11:08 AM 10/26/2022   10:55 AM  Fall Risk  Falls in the past year?     0  Was there an injury with Fall?     0  Fall Risk Category Calculator     0  (RETIRED) Patient Fall Risk Level High fall risk High fall risk High fall risk High fall risk   Patient at Risk for Falls Due to     No Fall Risks  Fall risk Follow up     Falls evaluation completed   Functional Status Survey:    Vitals:   11/21/22 0941  BP: 100/64  Pulse: 61  Resp: 20  Temp: (!) 97.2 F (36.2 C)  SpO2: 98%  Weight: 141 lb 14.4 oz (64.4 kg)  Height: 5\' 3"  (1.6 m)   Body mass index is 25.14 kg/m. Physical Exam Vitals reviewed.  Constitutional:      General: She is not in acute distress. HENT:     Head: Normocephalic.     Right Ear: There is impacted cerumen.     Nose: Nose normal.     Mouth/Throat:     Mouth: Mucous membranes are moist.  Eyes:     General:        Right eye: No discharge.        Left eye: No discharge.  Neck:     Comments: No JVD Cardiovascular:     Rate and Rhythm: Normal rate and regular rhythm.     Pulses: Normal pulses.     Heart sounds: Normal heart sounds.  Pulmonary:     Effort: Pulmonary effort is normal. No respiratory distress.     Breath sounds: Normal breath sounds. No wheezing.  Abdominal:     General: Bowel sounds are normal. There is no distension.     Palpations: Abdomen is soft.     Tenderness: There is no abdominal tenderness.  Musculoskeletal:     Cervical back: Neck supple.      Right lower leg: No edema.     Left lower leg:  No edema.  Skin:    General: Skin is warm.     Capillary Refill: Capillary refill takes less than 2 seconds.  Neurological:     General: No focal deficit present.     Mental Status: She is alert. Mental status is at baseline.     Motor: Weakness present.     Gait: Gait abnormal.  Psychiatric:        Mood and Affect: Mood normal.     Labs reviewed: Recent Labs    09/04/22 0750 09/05/22 0556 09/06/22 0540 09/07/22 0915 09/14/22 0000 10/26/22 0000 11/02/22 0000  NA 134* 137 137 137 137 132* 137  K 3.8 4.1 4.0 4.0 4.6 4.3 4.6  CL 106 110 110 104 99 94* 100  CO2 22 22 23 25  29* 27* 26*  GLUCOSE 75 77 85 101*  --   --   --   BUN 14 15 18 18  23* 31* 29*  CREATININE 1.15* 1.16* 1.26* 1.26* 1.3* 1.6* 1.7*  CALCIUM 8.4* 7.9* 8.6* 8.7* 8.8 8.8 8.9  PHOS 2.5 3.4 3.0  --   --   --   --    Recent Labs    09/02/22 1137 09/03/22 0642 09/04/22 0750 09/05/22 0556 09/06/22 0540 09/14/22 0000  AST 329* 239* 208*  --   --  61*  ALT 286* 240* 220*  --   --  65*  ALKPHOS 166* 151* 161*  --   --  223*  BILITOT 0.8 0.9 1.0  --   --   --   PROT 6.4* 5.5* 6.1*  --   --   --   ALBUMIN 2.9* 2.3* 2.5*  2.5* 2.1* 2.4* 3.0*   Recent Labs    09/01/22 2037 09/02/22 0713 09/03/22 0642 09/07/22 0915 09/14/22 0000 09/19/22 0000  WBC 7.5 8.7 7.8 8.3 10.4 12.0  NEUTROABS 5.1  --  5.2 5.3  --   --   HGB 12.6 12.1 11.8* 12.6 12.0 12.2  HCT 38.4 36.1 35.0* 38.8 35* 36  MCV 96.2 95.0 95.6 97.7  --   --   PLT 173 194 PLATELET CLUMPS NOTED ON SMEAR, UNABLE TO ESTIMATE 144*  --  130*   Lab Results  Component Value Date   TSH 3.14 09/28/2022   No results found for: "HGBA1C" Lab Results  Component Value Date   CHOL 128 12/13/2018   HDL 79 12/13/2018   LDLCALC 36 12/13/2018   TRIG 63 12/13/2018   CHOLHDL 1.6 12/13/2018    Significant Diagnostic Results in last 30 days:  No results found.  Assessment/Plan 1. Cerumen debris on tympanic  membrane of right ear - unable to visualize TM due to wax - start debrox- 5gtts to right ear at bedtime x 5 days - flush right ear with warm water when Debrox complete  2. Acute on chronic diastolic congestive heart failure (HCC) - BNP 425 10/26/2022 - 2D echo LVEF 70-75% 09/2022 - no weight fluctuations, sob, ankle edema today - cont furosemide  3. Moderate persistent asthma with exacerbation - on recent exacerbations - cont Brovana, pulmicort and duonebs prn  4. Hiatal hernia with GERD - hgb stable - cont Protonix  5. Pharyngeal dysphagia - aspiration PNA with hospitalization 03/29-04/04 - no recent exacerbations - cont DYS3/chopped diet  6. Primary hypertension - controlled with metoprolol  7. Stage 3b chronic kidney disease (HCC) - BUN/creat 29/1.71, GFR 27 11/02/2022 - furosemide recently increased due to CHF - will schedule bmp 06/20  8. Hypothyroidism, unspecified  type - TSH stable - cont levothyroxine    Family/ staff Communication: plan discussed with patient, husband, and nurse  Labs/tests ordered:  bmp 06/20

## 2022-11-22 DIAGNOSIS — R2689 Other abnormalities of gait and mobility: Secondary | ICD-10-CM | POA: Diagnosis not present

## 2022-11-22 DIAGNOSIS — J9601 Acute respiratory failure with hypoxia: Secondary | ICD-10-CM | POA: Diagnosis not present

## 2022-11-22 DIAGNOSIS — M6259 Muscle wasting and atrophy, not elsewhere classified, multiple sites: Secondary | ICD-10-CM | POA: Diagnosis not present

## 2022-11-22 DIAGNOSIS — J690 Pneumonitis due to inhalation of food and vomit: Secondary | ICD-10-CM | POA: Diagnosis not present

## 2022-11-22 DIAGNOSIS — R1312 Dysphagia, oropharyngeal phase: Secondary | ICD-10-CM | POA: Diagnosis not present

## 2022-11-23 DIAGNOSIS — M6259 Muscle wasting and atrophy, not elsewhere classified, multiple sites: Secondary | ICD-10-CM | POA: Diagnosis not present

## 2022-11-23 DIAGNOSIS — Z79899 Other long term (current) drug therapy: Secondary | ICD-10-CM | POA: Diagnosis not present

## 2022-11-23 DIAGNOSIS — J690 Pneumonitis due to inhalation of food and vomit: Secondary | ICD-10-CM | POA: Diagnosis not present

## 2022-11-23 LAB — BASIC METABOLIC PANEL
BUN: 29 — AB (ref 4–21)
CO2: 23 — AB (ref 13–22)
Chloride: 103 (ref 99–108)
Creatinine: 1.9 — AB (ref 0.5–1.1)
Glucose: 80
Potassium: 4.5 meq/L (ref 3.5–5.1)
Sodium: 138 (ref 137–147)

## 2022-11-23 LAB — COMPREHENSIVE METABOLIC PANEL
Calcium: 8.9 (ref 8.7–10.7)
eGFR: 23

## 2022-11-24 DIAGNOSIS — R1312 Dysphagia, oropharyngeal phase: Secondary | ICD-10-CM | POA: Diagnosis not present

## 2022-11-24 DIAGNOSIS — J9601 Acute respiratory failure with hypoxia: Secondary | ICD-10-CM | POA: Diagnosis not present

## 2022-11-24 DIAGNOSIS — J690 Pneumonitis due to inhalation of food and vomit: Secondary | ICD-10-CM | POA: Diagnosis not present

## 2022-11-27 DIAGNOSIS — R2689 Other abnormalities of gait and mobility: Secondary | ICD-10-CM | POA: Diagnosis not present

## 2022-11-27 DIAGNOSIS — J9601 Acute respiratory failure with hypoxia: Secondary | ICD-10-CM | POA: Diagnosis not present

## 2022-11-27 DIAGNOSIS — R1312 Dysphagia, oropharyngeal phase: Secondary | ICD-10-CM | POA: Diagnosis not present

## 2022-11-27 DIAGNOSIS — J690 Pneumonitis due to inhalation of food and vomit: Secondary | ICD-10-CM | POA: Diagnosis not present

## 2022-11-27 DIAGNOSIS — M6259 Muscle wasting and atrophy, not elsewhere classified, multiple sites: Secondary | ICD-10-CM | POA: Diagnosis not present

## 2022-11-29 DIAGNOSIS — J9601 Acute respiratory failure with hypoxia: Secondary | ICD-10-CM | POA: Diagnosis not present

## 2022-11-29 DIAGNOSIS — R1312 Dysphagia, oropharyngeal phase: Secondary | ICD-10-CM | POA: Diagnosis not present

## 2022-11-29 DIAGNOSIS — J690 Pneumonitis due to inhalation of food and vomit: Secondary | ICD-10-CM | POA: Diagnosis not present

## 2022-12-13 DIAGNOSIS — Z515 Encounter for palliative care: Secondary | ICD-10-CM | POA: Diagnosis not present

## 2022-12-18 ENCOUNTER — Encounter: Payer: Self-pay | Admitting: Internal Medicine

## 2022-12-18 ENCOUNTER — Non-Acute Institutional Stay (SKILLED_NURSING_FACILITY): Payer: Medicare Other | Admitting: Internal Medicine

## 2022-12-18 DIAGNOSIS — J4541 Moderate persistent asthma with (acute) exacerbation: Secondary | ICD-10-CM

## 2022-12-18 DIAGNOSIS — I5032 Chronic diastolic (congestive) heart failure: Secondary | ICD-10-CM

## 2022-12-18 DIAGNOSIS — R1313 Dysphagia, pharyngeal phase: Secondary | ICD-10-CM

## 2022-12-18 DIAGNOSIS — E039 Hypothyroidism, unspecified: Secondary | ICD-10-CM | POA: Diagnosis not present

## 2022-12-18 DIAGNOSIS — I480 Paroxysmal atrial fibrillation: Secondary | ICD-10-CM | POA: Diagnosis not present

## 2022-12-18 DIAGNOSIS — N1832 Chronic kidney disease, stage 3b: Secondary | ICD-10-CM

## 2022-12-18 NOTE — Progress Notes (Unsigned)
Location:   [facility] Room Number: 112A Place of Service:  SNF [phone]) Provider:  Einar Crow, MD  Mahlon Gammon, MD  Patient Care Team: Mahlon Gammon, MD as PCP - General (Internal Medicine) Jake Bathe, MD as PCP - Cardiology (Cardiology)  Extended Emergency Contact Information Primary Emergency Contact: Garrels,Sarah Address: [address]          [place], [place] [address] Darden Amber of [place] Phone: [phone] Relation: Daughter Secondary Emergency Contact: Merrily Pew Address: [address]          [place], [place] [address] Darden Amber of [place] Home Phone: [phone] Mobile Phone: [phone] Relation: Spouse  Code Status:  DNR Goals of care: Advanced Directive information    12/18/2022   11:45 AM  Advanced Directives  Does Patient Have a Medical Advance Directive? Yes  Type of Estate agent of [place];Living will;Out of facility DNR (pink MOST or yellow form)  Does patient want to make changes to medical advance directive? No - Patient declined  Copy of Healthcare Power of Attorney in Chart? Yes - validated most recent copy scanned in chart (See row information)  Pre-existing out of facility DNR order (yellow form or pink MOST form) Yellow form placed in chart (order not valid for inpatient use)     Chief Complaint  Patient presents with   Medical Management of Chronic Issues    Routinte follow up   Immunizations    COVID booster    HPI:  Pt is a 89 y.o. female seen today for medical management of chronic diseases.   Lives in [facility]  Patient has h/o PAF not on anticoagulation due to history of GI bleed, CKD stage IV, GERD, hypertension, hypothyroidism, s/p right breast lumpectomy cancer    Past Medical History:  Diagnosis Date   Actinic keratoses    Arthritis    back    Atrial fibrillation with RVR (HCC)    Breast cancer (HCC)    Breast cancer (right)    Chronic diastolic CHF (congestive heart failure) (HCC)    CKD (chronic kidney disease), stage III (HCC)    Compression fracture of L3 lumbar vertebra    Dysphagia    GERD (gastroesophageal reflux disease)    takes otc Pepcid as needed   Hiatal hernia 11/27/2014   HTN (hypertension)    Hypothyroidism    Insomnia    Osteopenia    PAF (paroxysmal atrial fibrillation) (HCC)    a. questionable episode in 2015 in [place]. b. event monitor through December/January 2014/2015 which showed no evidence of atrial fibrillation. c. Dx 12/2018.   PAT (paroxysmal atrial tachycardia)    Pleural effusion    Past Surgical History:  Procedure Laterality Date   BREAST LUMPECTOMY Right 1995   lumpectomy   COLONOSCOPY     ESOPHAGOGASTRODUODENOSCOPY (EGD) WITH PROPOFOL Left 01/09/2019   Procedure: ESOPHAGOGASTRODUODENOSCOPY (EGD) WITH PROPOFOL;  Surgeon: Jeani Hawking, MD;  Location: [facility] ENDOSCOPY;  Service: Endoscopy;  Laterality: Left;   EYE SURGERY Bilateral    cataract w/ lens implant   KYPHOPLASTY N/A 09/18/2013   Procedure: KYPHOPLASTY;  Surgeon: Emilee Hero, MD;  Location: [facility] OR;  Service: Orthopedics;  Laterality: N/A;  Lumbar 3 kyphoplasty   TONSILLECTOMY      Allergies  Allergen Reactions   Cardizem [Diltiazem]    Eliquis [Apixaban]    Tape Other (See Comments)    Patient prefers easy-release tape    Allergies as of 12/18/2022  Reactions   Cardizem [diltiazem]    Eliquis [apixaban]    Tape Other (See Comments)   Patient prefers easy-release tape        Medication List        Accurate as of December 18, 2022 11:58 AM. If you have any questions, ask your nurse or doctor.          acetaminophen 500 MG tablet Commonly known as: TYLENOL Take 2 tablets (1,000 mg total) by mouth every 8 (eight) hours.   Align 4 MG Caps Take 4 mg by mouth daily.   arformoterol 15 MCG/2ML Nebu Commonly known as: Brovana Take 2 mLs (15 mcg total) by nebulization in the morning and at  bedtime.   B-complex with vitamin C tablet Take 1 tablet by mouth daily.   benzonatate 100 MG capsule Commonly known as: TESSALON Take 100 mg by mouth 3 (three) times daily as needed for cough.   budesonide 0.5 MG/2ML nebulizer solution Commonly known as: Pulmicort Take 2 mLs (0.5 mg total) by nebulization in the morning and at bedtime.   docusate sodium 100 MG capsule Commonly known as: COLACE Take 100 mg by mouth daily.   Ensure Take 237 mLs by mouth 2 (two) times daily between meals.   furosemide 40 MG tablet Commonly known as: LASIX Take 1 tablet (40 mg total) by mouth daily.   ipratropium-albuterol 0.5-2.5 (3) MG/3ML Soln Commonly known as: DUONEB Take 3 mLs by nebulization every 6 (six) hours as needed (Wheezing/SOB).   levothyroxine 112 MCG tablet Commonly known as: SYNTHROID Take 112 mcg by mouth every morning.   melatonin 5 MG Tabs Take 5 mg by mouth at bedtime.   metoprolol tartrate 25 MG tablet Commonly known as: LOPRESSOR Take 0.5 tablets (12.5 mg total) by mouth 2 (two) times daily.   Mucinex 600 MG 12 hr tablet Generic drug: guaiFENesin Take 600 mg by mouth in the morning.   guaiFENesin 600 MG 12 hr tablet Commonly known as: MUCINEX Take 600 mg by mouth daily as needed for cough.   nitroGLYCERIN 0.4 MG SL tablet Commonly known as: NITROSTAT Place 1 tablet (0.4 mg total) under the tongue every 5 (five) minutes as needed for chest pain.   ondansetron 4 MG tablet Commonly known as: ZOFRAN Take 4 mg by mouth every 6 (six) hours as needed for nausea or vomiting.   pantoprazole 40 MG tablet Commonly known as: PROTONIX Take 1 tablet (40 mg total) by mouth daily.   polyethylene glycol powder 17 GM/SCOOP powder Commonly known as: GLYCOLAX/MIRALAX Take 17 g by mouth daily.   potassium chloride 10 MEQ tablet Commonly known as: KLOR-CON M Take 1 tablet (10 mEq total) by mouth daily.   sennosides-docusate sodium 8.6-50 MG tablet Commonly known as:  SENOKOT-S Take 1 tablet by mouth at bedtime as needed for constipation.   Vitamin D3 50 MCG (2000 UT) Tabs Take 2,000 Units by mouth daily.        Review of Systems  Immunization History  Administered Date(s) Administered   Covid-19, Mrna,Vaccine(Spikevax)38yrs and older 04/10/2022   Influenza Split 02/24/2010, 02/24/2014   Influenza, High Dose Seasonal PF 04/07/2015, 03/27/2018   Influenza,inj,Quad PF,6+ Mos 02/21/2016, 04/04/2017   Influenza-Unspecified 04/19/2012, 03/26/2013, 02/14/2019   Moderna SARS-COV2 Booster Vaccination 10/05/2021   Moderna Sars-Covid-2 Vaccination 06/15/2019, 07/18/2019, 04/28/2020   PNEUMOCOCCAL CONJUGATE-20 08/09/2021, 08/14/2022   Pneumococcal Conjugate-13 08/19/2013   Tdap 08/11/2011, 10/25/2022   Zoster Recombinant(Shingrix) 11/18/2020, 03/10/2022   Zoster, Live 11/18/2020, 03/07/2021   Pertinent  Health Maintenance Due  Topic Date Due   INFLUENZA VACCINE  01/04/2023   DEXA SCAN  Completed      01/08/2019    9:28 PM 01/09/2019    8:23 AM 01/09/2019   10:26 PM 01/10/2019   11:08 AM 10/26/2022   10:55 AM  Fall Risk  Falls in the past year?     0  Was there an injury with Fall?     0  Fall Risk Category Calculator     0  (RETIRED) Patient Fall Risk Level High fall risk High fall risk High fall risk High fall risk   Patient at Risk for Falls Due to     No Fall Risks  Fall risk Follow up     Falls evaluation completed   Functional Status Survey:    Vitals:   12/18/22 1142  BP: 136/69  Pulse: (!) 57  Resp: 16  Temp: (!) 97.2 F (36.2 C)  SpO2: 99%  Weight: 148 lb 11.2 oz (67.4 kg)  Height: 5\' 3"  (1.6 m)   Body mass index is 26.34 kg/m. Physical Exam  Labs reviewed: Recent Labs    09/04/22 0750 09/05/22 0556 09/06/22 0540 09/07/22 0915 09/14/22 0000 10/26/22 0000 11/02/22 0000  NA 134* 137 137 137 137 132* 137  K 3.8 4.1 4.0 4.0 4.6 4.3 4.6  CL 106 110 110 104 99 94* 100  CO2 22 22 23 25  29* 27* 26*  GLUCOSE 75 77 85 101*   --   --   --   BUN 14 15 18 18  23* 31* 29*  CREATININE 1.15* 1.16* 1.26* 1.26* 1.3* 1.6* 1.7*  CALCIUM 8.4* 7.9* 8.6* 8.7* 8.8 8.8 8.9  PHOS 2.5 3.4 3.0  --   --   --   --    Recent Labs    09/02/22 1137 09/03/22 0642 09/04/22 0750 09/05/22 0556 09/06/22 0540 09/14/22 0000  AST 329* 239* 208*  --   --  61*  ALT 286* 240* 220*  --   --  65*  ALKPHOS 166* 151* 161*  --   --  223*  BILITOT 0.8 0.9 1.0  --   --   --   PROT 6.4* 5.5* 6.1*  --   --   --   ALBUMIN 2.9* 2.3* 2.5*  2.5* 2.1* 2.4* 3.0*   Recent Labs    09/01/22 2037 09/02/22 0713 09/03/22 0642 09/07/22 0915 09/14/22 0000 09/19/22 0000  WBC 7.5 8.7 7.8 8.3 10.4 12.0  NEUTROABS 5.1  --  5.2 5.3  --   --   HGB 12.6 12.1 11.8* 12.6 12.0 12.2  HCT 38.4 36.1 35.0* 38.8 35* 36  MCV 96.2 95.0 95.6 97.7  --   --   PLT 173 194 PLATELET CLUMPS NOTED ON SMEAR, UNABLE TO ESTIMATE 144*  --  130*   Lab Results  Component Value Date   TSH 3.14 09/28/2022   No results found for: "HGBA1C" Lab Results  Component Value Date   CHOL 128 12/13/2018   HDL 79 12/13/2018   LDLCALC 36 12/13/2018   TRIG 63 12/13/2018   CHOLHDL 1.6 12/13/2018    Significant Diagnostic Results in last 30 days:  No results found.  Assessment/Plan There are no diagnoses linked to this encounter.   Family/ staff Communication:   Labs/tests ordered:

## 2023-01-08 DIAGNOSIS — Z515 Encounter for palliative care: Secondary | ICD-10-CM | POA: Diagnosis not present

## 2023-01-15 ENCOUNTER — Non-Acute Institutional Stay (SKILLED_NURSING_FACILITY): Payer: Medicare Other | Admitting: Adult Health

## 2023-01-15 DIAGNOSIS — E039 Hypothyroidism, unspecified: Secondary | ICD-10-CM | POA: Diagnosis not present

## 2023-01-15 DIAGNOSIS — I5032 Chronic diastolic (congestive) heart failure: Secondary | ICD-10-CM

## 2023-01-15 DIAGNOSIS — I10 Essential (primary) hypertension: Secondary | ICD-10-CM

## 2023-01-15 DIAGNOSIS — K5901 Slow transit constipation: Secondary | ICD-10-CM

## 2023-01-15 DIAGNOSIS — S39012D Strain of muscle, fascia and tendon of lower back, subsequent encounter: Secondary | ICD-10-CM

## 2023-01-15 DIAGNOSIS — K219 Gastro-esophageal reflux disease without esophagitis: Secondary | ICD-10-CM

## 2023-01-15 DIAGNOSIS — N1831 Chronic kidney disease, stage 3a: Secondary | ICD-10-CM

## 2023-01-17 ENCOUNTER — Encounter: Payer: Self-pay | Admitting: Adult Health

## 2023-01-17 MED ORDER — POLYETHYLENE GLYCOL 3350 17 GM/SCOOP PO POWD
17.0000 g | Freq: Every day | ORAL | Status: DC | PRN
Start: 2023-01-17 — End: 2023-05-29

## 2023-01-17 MED ORDER — ACETAMINOPHEN 500 MG PO TABS
1000.0000 mg | ORAL_TABLET | Freq: Three times a day (TID) | ORAL | Status: DC | PRN
Start: 2023-01-17 — End: 2023-05-28

## 2023-01-17 NOTE — Progress Notes (Signed)
Location:  Medical illustrator of Service:  SNF (31) Provider:   Peggye Ley, ANP [facility] [phone]   Mahlon Gammon, MD  Patient Care Team: Mahlon Gammon, MD as PCP - General (Internal Medicine) Jake Bathe, MD as PCP - Cardiology (Cardiology)  Extended Emergency Contact Information Primary Emergency Contact: Aday,Sarah Address: [address]          [place], [place] [address] Darden Amber of [place] Phone: [phone] Relation: Daughter Secondary Emergency Contact: Merrily Pew Address: [address]          [place], [place] [address] Darden Amber of [place] Home Phone: [phone] Mobile Phone: [phone] Relation: Spouse  Code Status:  DNR palliative  Goals of care: Advanced Directive information    12/18/2022   11:45 AM  Advanced Directives  Does Patient Have a Medical Advance Directive? Yes  Type of Estate agent of [place];Living will;Out of facility DNR (pink MOST or yellow form)  Does patient want to make changes to medical advance directive? No - Patient declined  Copy of Healthcare Power of Attorney in Chart? Yes - validated most recent copy scanned in chart (See row information)  Pre-existing out of facility DNR order (yellow form or pink MOST form) Yellow form placed in chart (order not valid for inpatient use)     Chief Complaint  Patient presents with   Medical Management of Chronic Issues    HPI:  Pt is a 89 y.o. female seen today for medical management of chronic diseases.    PMH permanent afib, off eliquis due to GIB with cameron erosions on EGD 01/09/19, asthma, breast ca, also with hypothyroidism, CKD III, CHF with HF pEF, HTN   Hospitalized and treated for possible pna 09/01/22-09/07/22. On 4/1 had an esophgram with limited exam with possible distal esophageal stricture, and moderate to large hiatal hernia. EGD not pursued.   She has seen palliative care.  Goals of care are comfort based Now living in skilled care with no acute complaints.   Would like her lasix reduced due to urinary frequency No sob or doe 2D echo 09/07/22 EF 70-75% bilateral atrial enlargement and grade 2 diastolic dysfunction.  Her weight did go up from June to July but no further weight gain  Wt Readings from Last 3 Encounters:  01/17/23 147 lb 4.8 oz (66.8 kg)  12/18/22 148 lb 11.2 oz (67.4 kg)  11/21/22 141 lb 14.4 oz (64.4 kg)   For hx of asthma and wheezing she was placed on brovana and pulmicort  May of 2024 No further issues since then with exacerbation.      Past Medical History:  Diagnosis Date   Actinic keratoses    Arthritis    back    Atrial fibrillation with RVR (HCC)    Breast cancer (HCC)    Breast cancer (right)   Chronic diastolic CHF (congestive heart failure) (HCC)    CKD (chronic kidney disease), stage III (HCC)    Compression fracture of L3 lumbar vertebra    Dysphagia    GERD (gastroesophageal reflux disease)    takes otc Pepcid as needed   Hiatal hernia 11/27/2014   HTN (hypertension)    Hypothyroidism    Insomnia    Osteopenia    PAF (paroxysmal atrial fibrillation) (HCC)    a. questionable episode in 2015 in [place]. b. event monitor through December/January 2014/2015 which showed no evidence of atrial fibrillation. c. Dx 12/2018.   PAT (paroxysmal  atrial tachycardia)    Pleural effusion    Past Surgical History:  Procedure Laterality Date   BREAST LUMPECTOMY Right 1995   lumpectomy   COLONOSCOPY     ESOPHAGOGASTRODUODENOSCOPY (EGD) WITH PROPOFOL Left 01/09/2019   Procedure: ESOPHAGOGASTRODUODENOSCOPY (EGD) WITH PROPOFOL;  Surgeon: Jeani Hawking, MD;  Location: [facility];  Service: Endoscopy;  Laterality: Left;   EYE SURGERY Bilateral    cataract w/ lens implant   KYPHOPLASTY N/A 09/18/2013   Procedure: KYPHOPLASTY;  Surgeon: Emilee Hero, MD;  Location: [facility] OR;  Service: Orthopedics;  Laterality: N/A;  Lumbar 3  kyphoplasty   TONSILLECTOMY      Allergies  Allergen Reactions   Cardizem [Diltiazem]    Eliquis [Apixaban]    Tape Other (See Comments)    Patient prefers easy-release tape    Outpatient Encounter Medications as of 01/15/2023  Medication Sig   acetaminophen (TYLENOL) 500 MG tablet Take 2 tablets (1,000 mg total) by mouth every 8 (eight) hours as needed.   arformoterol (BROVANA) 15 MCG/2ML NEBU Take 2 mLs (15 mcg total) by nebulization in the morning and at bedtime.   B Complex-C (B-COMPLEX WITH VITAMIN C) tablet Take 1 tablet by mouth daily.   benzonatate (TESSALON) 100 MG capsule Take 100 mg by mouth 3 (three) times daily as needed for cough.   budesonide (PULMICORT) 0.5 MG/2ML nebulizer solution Take 2 mLs (0.5 mg total) by nebulization in the morning and at bedtime.   Cholecalciferol (VITAMIN D3) 2000 units TABS Take 2,000 Units by mouth daily.    docusate sodium (COLACE) 100 MG capsule Take 100 mg by mouth daily.   Ensure (ENSURE) Take 237 mLs by mouth 2 (two) times daily between meals.   furosemide (LASIX) 40 MG tablet Take 1 tablet (40 mg total) by mouth daily.   guaiFENesin (MUCINEX) 600 MG 12 hr tablet Take 600 mg by mouth in the morning.   guaiFENesin (MUCINEX) 600 MG 12 hr tablet Take 600 mg by mouth daily as needed for cough.   ipratropium-albuterol (DUONEB) 0.5-2.5 (3) MG/3ML SOLN Take 3 mLs by nebulization every 6 (six) hours as needed (Wheezing/SOB).   levothyroxine (SYNTHROID) 112 MCG tablet Take 112 mcg by mouth every morning.   melatonin 5 MG TABS Take 5 mg by mouth at bedtime.   nitroGLYCERIN (NITROSTAT) 0.4 MG SL tablet Place 1 tablet (0.4 mg total) under the tongue every 5 (five) minutes as needed for chest pain.   ondansetron (ZOFRAN) 4 MG tablet Take 4 mg by mouth every 6 (six) hours as needed for nausea or vomiting.   pantoprazole (PROTONIX) 40 MG tablet Take 1 tablet (40 mg total) by mouth daily.   polyethylene glycol powder (GLYCOLAX/MIRALAX) 17 GM/SCOOP powder  Take 17 g by mouth daily as needed.   potassium chloride (KLOR-CON M) 10 MEQ tablet Take 1 tablet (10 mEq total) by mouth daily.   Probiotic Product (ALIGN) 4 MG CAPS Take 4 mg by mouth daily.   sennosides-docusate sodium (SENOKOT-S) 8.6-50 MG tablet Take 1 tablet by mouth at bedtime as needed for constipation.   [DISCONTINUED] acetaminophen (TYLENOL) 500 MG tablet Take 2 tablets (1,000 mg total) by mouth every 8 (eight) hours.   [DISCONTINUED] polyethylene glycol powder (GLYCOLAX/MIRALAX) 17 GM/SCOOP powder Take 17 g by mouth daily.   Facility-Administered Encounter Medications as of 01/15/2023  Medication   bisacodyl (DULCOLAX) suppository 10 mg    Review of Systems  Constitutional:  Negative for activity change, appetite change, chills, diaphoresis, fatigue, fever and unexpected weight  change.  HENT:  Negative for congestion.   Respiratory:  Negative for cough, shortness of breath and wheezing.   Cardiovascular:  Negative for chest pain, palpitations and leg swelling.  Gastrointestinal:  Negative for abdominal distention, abdominal pain, constipation and diarrhea.  Genitourinary:  Positive for frequency. Negative for difficulty urinating and dysuria.  Musculoskeletal:  Positive for gait problem. Negative for arthralgias, back pain, joint swelling and myalgias.  Neurological:  Negative for dizziness, tremors, seizures, syncope, facial asymmetry, speech difficulty, weakness, light-headedness, numbness and headaches.  Psychiatric/Behavioral:  Negative for agitation, behavioral problems and confusion.    Immunization History  Administered Date(s) Administered   Covid-19, Mrna,Vaccine(Spikevax)7yrs and older 04/10/2022   Influenza Split 02/24/2010, 02/24/2014   Influenza, High Dose Seasonal PF 04/07/2015, 03/27/2018   Influenza,inj,Quad PF,6+ Mos 02/21/2016, 04/04/2017   Influenza-Unspecified 04/19/2012, 03/26/2013, 02/14/2019   Moderna SARS-COV2 Booster Vaccination 10/05/2021   Moderna  Sars-Covid-2 Vaccination 06/15/2019, 07/18/2019, 04/28/2020   PNEUMOCOCCAL CONJUGATE-20 08/09/2021, 08/14/2022   Pneumococcal Conjugate-13 08/19/2013   Tdap 08/11/2011, 10/25/2022   Zoster Recombinant(Shingrix) 11/18/2020, 03/10/2022   Zoster, Live 11/18/2020, 03/07/2021   Pertinent  Health Maintenance Due  Topic Date Due   INFLUENZA VACCINE  01/04/2023   DEXA SCAN  Completed      01/08/2019    9:28 PM 01/09/2019    8:23 AM 01/09/2019   10:26 PM 01/10/2019   11:08 AM 10/26/2022   10:55 AM  Fall Risk  Falls in the past year?     0  Was there an injury with Fall?     0  Fall Risk Category Calculator     0  (RETIRED) Patient Fall Risk Level High fall risk High fall risk High fall risk High fall risk   Patient at Risk for Falls Due to     No Fall Risks  Fall risk Follow up     Falls evaluation completed   Functional Status Survey:    Vitals:   01/17/23 0737  Weight: 147 lb 4.8 oz (66.8 kg)   Body mass index is 26.09 kg/m. Physical Exam Vitals and nursing note reviewed.  Constitutional:      General: She is not in acute distress.    Appearance: She is not diaphoretic.  HENT:     Head: Normocephalic and atraumatic.  Neck:     Vascular: No JVD.  Cardiovascular:     Rate and Rhythm: Normal rate and regular rhythm.     Heart sounds: No murmur heard. Pulmonary:     Effort: Pulmonary effort is normal. No respiratory distress.     Breath sounds: Normal breath sounds. No wheezing.  Skin:    General: Skin is warm and dry.  Neurological:     Mental Status: She is alert and oriented to person, place, and time.   Labs reviewed: Recent Labs    09/04/22 0750 09/05/22 0556 09/06/22 0540 09/07/22 0915 09/14/22 0000 10/26/22 0000 11/02/22 0000  NA 134* 137 137 137 137 132* 137  K 3.8 4.1 4.0 4.0 4.6 4.3 4.6  CL 106 110 110 104 99 94* 100  CO2 22 22 23 25  29* 27* 26*  GLUCOSE 75 77 85 101*  --   --   --   BUN 14 15 18 18  23* 31* 29*  CREATININE 1.15* 1.16* 1.26* 1.26* 1.3*  1.6* 1.7*  CALCIUM 8.4* 7.9* 8.6* 8.7* 8.8 8.8 8.9  PHOS 2.5 3.4 3.0  --   --   --   --    Recent Labs  09/02/22 1137 09/03/22 0642 09/04/22 0750 09/05/22 0556 09/06/22 0540 09/14/22 0000  AST 329* 239* 208*  --   --  61*  ALT 286* 240* 220*  --   --  65*  ALKPHOS 166* 151* 161*  --   --  223*  BILITOT 0.8 0.9 1.0  --   --   --   PROT 6.4* 5.5* 6.1*  --   --   --   ALBUMIN 2.9* 2.3* 2.5*  2.5* 2.1* 2.4* 3.0*   Recent Labs    09/01/22 2037 09/02/22 0713 09/03/22 0642 09/07/22 0915 09/14/22 0000 09/19/22 0000  WBC 7.5 8.7 7.8 8.3 10.4 12.0  NEUTROABS 5.1  --  5.2 5.3  --   --   HGB 12.6 12.1 11.8* 12.6 12.0 12.2  HCT 38.4 36.1 35.0* 38.8 35* 36  MCV 96.2 95.0 95.6 97.7  --   --   PLT 173 194 PLATELET CLUMPS NOTED ON SMEAR, UNABLE TO ESTIMATE 144*  --  130*   Lab Results  Component Value Date   TSH 3.14 09/28/2022   No results found for: "HGBA1C" Lab Results  Component Value Date   CHOL 128 12/13/2018   HDL 79 12/13/2018   LDLCALC 36 12/13/2018   TRIG 63 12/13/2018   CHOLHDL 1.6 12/13/2018    Significant Diagnostic Results in last 30 days:  No results found.  Assessment/Plan 1. Slow transit constipation Continue colace, prn miralax and senokot  2. Essential hypertension Controlled   3. Chronic heart failure with preserved ejection fraction (HFpEF) (HCC) Appears compensated on current dose of lasix Did have some weight gain in the month prior so will not change lasix right now Re address next month as she would like a dose reduction due to urinary frequency  4. Gastroesophageal reflux disease without esophagitis Hx of GIB Would not taper protonix   5. Hypothyroidism, unspecified type Lab Results  Component Value Date   TSH 3.14 09/28/2022   Continue Synthroid   6. Stage 3a chronic kidney disease (HCC) Continue to periodically monitor BMP and avoid nephrotoxic agents     Family/ staff Communication: resident and her husband   Labs/tests  ordered:  NA

## 2023-02-12 DIAGNOSIS — H353131 Nonexudative age-related macular degeneration, bilateral, early dry stage: Secondary | ICD-10-CM | POA: Diagnosis not present

## 2023-02-12 DIAGNOSIS — Z961 Presence of intraocular lens: Secondary | ICD-10-CM | POA: Diagnosis not present

## 2023-02-17 ENCOUNTER — Telehealth: Payer: Medicare Other | Admitting: Student

## 2023-02-17 DIAGNOSIS — M25561 Pain in right knee: Secondary | ICD-10-CM | POA: Diagnosis not present

## 2023-02-17 NOTE — Telephone Encounter (Signed)
  Patient had a syncopal event during valsalva yesterday, unclear if she had injury at the time.Knee and calf pain. Vital signs stable. Knee is swollen. No erythema or swelling at the calf. Tylenol without improvement at this time. X-ray ordered

## 2023-02-19 ENCOUNTER — Encounter: Payer: Self-pay | Admitting: Adult Health

## 2023-02-19 ENCOUNTER — Non-Acute Institutional Stay (SKILLED_NURSING_FACILITY): Payer: Medicare Other | Admitting: Adult Health

## 2023-02-19 DIAGNOSIS — S8001XA Contusion of right knee, initial encounter: Secondary | ICD-10-CM | POA: Diagnosis not present

## 2023-02-19 DIAGNOSIS — I10 Essential (primary) hypertension: Secondary | ICD-10-CM | POA: Diagnosis not present

## 2023-02-19 DIAGNOSIS — R55 Syncope and collapse: Secondary | ICD-10-CM | POA: Diagnosis not present

## 2023-02-19 DIAGNOSIS — I5032 Chronic diastolic (congestive) heart failure: Secondary | ICD-10-CM | POA: Diagnosis not present

## 2023-02-19 DIAGNOSIS — K449 Diaphragmatic hernia without obstruction or gangrene: Secondary | ICD-10-CM

## 2023-02-19 DIAGNOSIS — I480 Paroxysmal atrial fibrillation: Secondary | ICD-10-CM | POA: Diagnosis not present

## 2023-02-19 DIAGNOSIS — K219 Gastro-esophageal reflux disease without esophagitis: Secondary | ICD-10-CM | POA: Diagnosis not present

## 2023-02-19 DIAGNOSIS — E039 Hypothyroidism, unspecified: Secondary | ICD-10-CM

## 2023-02-19 MED ORDER — IBUPROFEN 200 MG PO TABS
400.0000 mg | ORAL_TABLET | Freq: Two times a day (BID) | ORAL | Status: DC
Start: 2023-02-19 — End: 2023-02-19

## 2023-02-19 MED ORDER — DICLOFENAC SODIUM 1 % EX GEL: 4.0000 g | Freq: Four times a day (QID) | CUTANEOUS | Status: AC

## 2023-02-19 NOTE — Progress Notes (Signed)
Location:  [facility] Room Number: 112A Place of Service:  SNF [phone]) Provider:  Tamsen Roers, MD  Patient Care Team: Mahlon Gammon, MD as PCP - General (Internal Medicine) Jake Bathe, MD as PCP - Cardiology (Cardiology)  Extended Emergency Contact Information Primary Emergency Contact: Hallstrom,Sarah Address: [address]          [place], [place] [address] D[place] of [place] Phone: [phone] Relation: Daughter Secondary Emergency Contact: Merrily Pew Address: [address]          [place], [place] [address] Darden [place] of [place] Home Phone: [phone] Mobile Phone: [phone] Relation: Spouse  Code Status:  DNR Goals of care: Advanced Directive information    02/19/2023   10:00 AM  Advanced Directives  Does Patient Have a Medical Advance Directive? Yes  Type of Advance Directive Out of facility DNR (pink MOST or yellow form)  Does patient want to make changes to medical advance directive? No - Patient declined  Pre-existing out of facility DNR order (yellow form or pink MOST form) Yellow form placed in chart (order not valid for inpatient use)     Chief Complaint  Patient presents with   Acute Visit    Patient is being seen for acute leg pain    Immunizations    Patient is due for covid and flu vaccine     HPI:  Pt is a 89 y.o. female seen today for an acute visit for right knee pain.  She had a syncopal episode after a BM on 02/16/23. She did not fall but walked back to the recliner and is at baseline. No sob or chest pain. No focal deficit.  They are concerned she "twisted" her right knee during this time. Her caregiver reports regular BMs. Nurse did report they were hard.  Right knee xray was done which showed no acute process.  She has a small bruise and mild swelling on the right. She is having trouble bearing weight on that side.   PMH permanent afib, off eliquis due to  GIB with cameron erosions on EGD 01/09/19, asthma, breast ca, also with hypothyroidism, CKD III, CHF with HF pEF, HTN   She has seen palliative care. Goals of care are comfort based Now living in skilled care with no acute complaints.   Has gained 3-4 lbs in the past month, but the weights recorded are quite variable.  No sob or doe, no increased edema.  2D echo 09/07/22 EF 70-75% bilateral atrial enlargement and grade 2 diastolic dysfunction.   For hx of asthma and wheezing she was placed on brovana and pulmicort  May of 2024 No further issues since then with exacerbation.   CKD IV worsening over time. 29/1.9  Hypothyroidism Lab Results  Component Value Date   TSH 3.14 09/28/2022   Esophageal dysmotility  and moderate to large hiatal hernia found on Esophogram EGD 2020 cameron lesions found  Past Medical History:  Diagnosis Date   Actinic keratoses    Arthritis    back    Atrial fibrillation with RVR (HCC)    Breast cancer (HCC)    Breast cancer (right)   Chronic diastolic CHF (congestive heart failure) (HCC)    CKD (chronic kidney disease), stage III (HCC)    Compression fracture of L3 lumbar vertebra    Dysphagia    GERD (gastroesophageal reflux disease)    takes otc Pepcid as needed   Hiatal hernia 11/27/2014  HTN (hypertension)    Hypothyroidism    Insomnia    Osteopenia    PAF (paroxysmal atrial fibrillation) (HCC)    a. questionable episode in 2015 in [facility]. b. event monitor through December/January 2014/2015 which showed no evidence of atrial fibrillation. c. Dx 12/2018.   PAT (paroxysmal atrial tachycardia)    Pleural effusion    Past Surgical History:  Procedure Laterality Date   BREAST LUMPECTOMY Right 1995   lumpectomy   COLONOSCOPY     ESOPHAGOGASTRODUODENOSCOPY (EGD) WITH PROPOFOL Left 01/09/2019   Procedure: ESOPHAGOGASTRODUODENOSCOPY (EGD) WITH PROPOFOL;  Surgeon: Jeani Hawking, MD;  Location: [facility];  Service: Endoscopy;  Laterality: Left;   EYE  SURGERY Bilateral    cataract w/ lens implant   KYPHOPLASTY N/A 09/18/2013   Procedure: KYPHOPLASTY;  Surgeon: Emilee Hero, MD;  Location: [facility] OR;  Service: Orthopedics;  Laterality: N/A;  Lumbar 3 kyphoplasty   TONSILLECTOMY      Allergies  Allergen Reactions   Cardizem [Diltiazem]    Eliquis [Apixaban]    Tape Other (See Comments)    Patient prefers easy-release tape    Outpatient Encounter Medications as of 02/19/2023  Medication Sig   acetaminophen (TYLENOL) 500 MG tablet Take 2 tablets (1,000 mg total) by mouth every 8 (eight) hours as needed.   arformoterol (BROVANA) 15 MCG/2ML NEBU Take 2 mLs (15 mcg total) by nebulization in the morning and at bedtime.   B Complex-C (B-COMPLEX WITH VITAMIN C) tablet Take 1 tablet by mouth daily.   benzonatate (TESSALON) 100 MG capsule Take 100 mg by mouth 3 (three) times daily as needed for cough.   budesonide (PULMICORT) 0.5 MG/2ML nebulizer solution Take 2 mLs (0.5 mg total) by nebulization in the morning and at bedtime.   Cholecalciferol (VITAMIN D3) 2000 units TABS Take 2,000 Units by mouth daily.    docusate sodium (COLACE) 100 MG capsule Take 100 mg by mouth daily.   Ensure (ENSURE) Take 237 mLs by mouth 2 (two) times daily between meals.   furosemide (LASIX) 40 MG tablet Take 1 tablet (40 mg total) by mouth daily.   guaiFENesin (MUCINEX) 600 MG 12 hr tablet Take 600 mg by mouth in the morning.   guaiFENesin (MUCINEX) 600 MG 12 hr tablet Take 600 mg by mouth daily as needed for cough.   ipratropium-albuterol (DUONEB) 0.5-2.5 (3) MG/3ML SOLN Take 3 mLs by nebulization every 6 (six) hours as needed (Wheezing/SOB).   levothyroxine (SYNTHROID) 112 MCG tablet Take 112 mcg by mouth every morning.   melatonin 5 MG TABS Take 5 mg by mouth at bedtime.   nitroGLYCERIN (NITROSTAT) 0.4 MG SL tablet Place 1 tablet (0.4 mg total) under the tongue every 5 (five) minutes as needed for chest pain.   ondansetron (ZOFRAN) 4 MG tablet Take 4 mg by  mouth every 6 (six) hours as needed for nausea or vomiting.   pantoprazole (PROTONIX) 40 MG tablet Take 1 tablet (40 mg total) by mouth daily.   polyethylene glycol powder (GLYCOLAX/MIRALAX) 17 GM/SCOOP powder Take 17 g by mouth daily as needed.   potassium chloride (KLOR-CON M) 10 MEQ tablet Take 1 tablet (10 mEq total) by mouth daily.   Probiotic Product (ALIGN) 4 MG CAPS Take 4 mg by mouth daily.   sennosides-docusate sodium (SENOKOT-S) 8.6-50 MG tablet Take 1 tablet by mouth at bedtime as needed for constipation.   metoprolol tartrate (LOPRESSOR) 25 MG tablet Take 0.5 tablets (12.5 mg total) by mouth 2 (two) times daily.   Facility-Administered  Encounter Medications as of 02/19/2023  Medication   bisacodyl (DULCOLAX) suppository 10 mg    Review of Systems  Unable to perform ROS: Dementia  Constitutional:  Negative for activity change, appetite change, chills, diaphoresis, fatigue, fever and unexpected weight change.  HENT:  Negative for congestion.   Respiratory:  Negative for cough, shortness of breath and wheezing.   Cardiovascular:  Negative for chest pain, palpitations and leg swelling.  Gastrointestinal:  Negative for abdominal distention, abdominal pain, constipation and diarrhea.  Genitourinary:  Negative for difficulty urinating and dysuria.  Musculoskeletal:  Positive for arthralgias, gait problem and joint swelling. Negative for back pain and myalgias.  Neurological:  Positive for syncope. Negative for dizziness, tremors, seizures, facial asymmetry, speech difficulty, weakness, light-headedness, numbness and headaches.  Psychiatric/Behavioral:  Negative for agitation, behavioral problems and confusion.     Immunization History  Administered Date(s) Administered   Covid-19, Mrna,Vaccine(Spikevax)23yrs and older 04/10/2022   Influenza Split 02/24/2010, 02/24/2014   Influenza, High Dose Seasonal PF 04/07/2015, 03/27/2018   Influenza,inj,Quad PF,6+ Mos 02/21/2016, 04/04/2017    Influenza-Unspecified 04/19/2012, 03/26/2013, 02/14/2019   Moderna SARS-COV2 Booster Vaccination 10/05/2021   Moderna Sars-Covid-2 Vaccination 06/15/2019, 07/18/2019, 04/28/2020   PNEUMOCOCCAL CONJUGATE-20 08/09/2021, 08/14/2022   Pneumococcal Conjugate-13 08/19/2013   Tdap 08/11/2011, 10/25/2022   Zoster Recombinant(Shingrix) 11/18/2020, 03/10/2022   Zoster, Live 11/18/2020, 03/07/2021   Pertinent  Health Maintenance Due  Topic Date Due   INFLUENZA VACCINE  01/04/2023   DEXA SCAN  Completed      01/09/2019    8:23 AM 01/09/2019   10:26 PM 01/10/2019   11:08 AM 10/26/2022   10:55 AM 02/19/2023   10:00 AM  Fall Risk  Falls in the past year?    0 0  Was there an injury with Fall?    0 0  Fall Risk Category Calculator    0 0  (RETIRED) Patient Fall Risk Level High fall risk High fall risk High fall risk    Patient at Risk for Falls Due to    No Fall Risks No Fall Risks  Fall risk Follow up    Falls evaluation completed Falls evaluation completed   Functional Status Survey:    Vitals:   02/19/23 0956  BP: (!) 147/73  Pulse: 63  Resp: 16  Temp: 98.1 F (36.7 C)  TempSrc: Temporal  SpO2: 95%  Weight: 151 lb 1.6 oz (68.5 kg)  Height: 5\' 3"  (1.6 m)   Body mass index is 26.77 kg/m. Wt Readings from Last 3 Encounters:  02/19/23 151 lb 1.6 oz (68.5 kg)  01/17/23 147 lb 4.8 oz (66.8 kg)  12/18/22 148 lb 11.2 oz (67.4 kg)    Physical Exam Vitals and nursing note reviewed.  Constitutional:      General: She is not in acute distress.    Appearance: She is not diaphoretic.  HENT:     Head: Normocephalic and atraumatic.  Neck:     Vascular: No JVD.  Cardiovascular:     Rate and Rhythm: Normal rate and regular rhythm.     Heart sounds: No murmur heard. Pulmonary:     Effort: Pulmonary effort is normal. No respiratory distress.     Breath sounds: Normal breath sounds. No wheezing.  Abdominal:     General: Bowel sounds are normal. There is no distension.     Palpations:  Abdomen is soft.     Tenderness: There is no abdominal tenderness.  Musculoskeletal:        General: Swelling (right knee  mild, no ballotment) present. No deformity.     Right lower leg: No edema.     Left lower leg: No edema.     Comments: Pain with ROM of right knee.   Skin:    General: Skin is warm and dry.     Findings: Bruising (right lateral knee area) present.  Neurological:     Mental Status: She is alert and oriented to person, place, and time.    Labs reviewed: Recent Labs    09/04/22 0750 09/05/22 0556 09/06/22 0540 09/07/22 0915 09/14/22 0000 10/26/22 0000 11/02/22 0000  NA 134* 137 137 137 137 132* 137  K 3.8 4.1 4.0 4.0 4.6 4.3 4.6  CL 106 110 110 104 99 94* 100  CO2 22 22 23 25  29* 27* 26*  GLUCOSE 75 77 85 101*  --   --   --   BUN 14 15 18 18  23* 31* 29*  CREATININE 1.15* 1.16* 1.26* 1.26* 1.3* 1.6* 1.7*  CALCIUM 8.4* 7.9* 8.6* 8.7* 8.8 8.8 8.9  PHOS 2.5 3.4 3.0  --   --   --   --    Recent Labs    09/02/22 1137 09/03/22 0642 09/04/22 0750 09/05/22 0556 09/06/22 0540 09/14/22 0000  AST 329* 239* 208*  --   --  61*  ALT 286* 240* 220*  --   --  65*  ALKPHOS 166* 151* 161*  --   --  223*  BILITOT 0.8 0.9 1.0  --   --   --   PROT 6.4* 5.5* 6.1*  --   --   --   ALBUMIN 2.9* 2.3* 2.5*  2.5* 2.1* 2.4* 3.0*   Recent Labs    09/01/22 2037 09/02/22 0713 09/03/22 0642 09/07/22 0915 09/14/22 0000 09/19/22 0000  WBC 7.5 8.7 7.8 8.3 10.4 12.0  NEUTROABS 5.1  --  5.2 5.3  --   --   HGB 12.6 12.1 11.8* 12.6 12.0 12.2  HCT 38.4 36.1 35.0* 38.8 35* 36  MCV 96.2 95.0 95.6 97.7  --   --   PLT 173 194 PLATELET CLUMPS NOTED ON SMEAR, UNABLE TO ESTIMATE 144*  --  130*   Lab Results  Component Value Date   TSH 3.14 09/28/2022   No results found for: "HGBA1C" Lab Results  Component Value Date   CHOL 128 12/13/2018   HDL 79 12/13/2018   LDLCALC 36 12/13/2018   TRIG 63 12/13/2018   CHOLHDL 1.6 12/13/2018    Significant Diagnostic Results in last 30  days:  No results found.  Assessment/Plan  1. Contusion of right knee, initial encounter ICE tid x 72 hrs Voltaren gel x 2 weeks  If not improving f/u with ortho   2. Vasovagal syncope Avoid constipation and straining (nurse to give prn miralax) Stay hydrated Will check labs If this reoccurs report to [facility]  3. Essential hypertension Controlled   4. Chronic heart failure with preserved ejection fraction (HFpEF) (HCC) Continue lasix 40 mg daily Monitor weight Low sodium diet No signs of CHF at this time despite mild weight gain.   5. Paroxysmal atrial fibrillation (HCC) Not on eliquis due to hx of GIB Rate is controlled on Lopressor   6. Hypothyroidism, unspecified type Continue synthroid Pharmacy will be changing to generic and checking TSH  7. Hiatal hernia with GERD Continue PPI  Family/ staff Communication: talked with her husband Bill  Labs/tests ordered:  CBC CMP in am

## 2023-02-23 DIAGNOSIS — Z79899 Other long term (current) drug therapy: Secondary | ICD-10-CM | POA: Diagnosis not present

## 2023-02-27 DIAGNOSIS — Z79899 Other long term (current) drug therapy: Secondary | ICD-10-CM | POA: Diagnosis not present

## 2023-02-27 LAB — HEPATIC FUNCTION PANEL
ALT: 8 U/L (ref 7–35)
AST: 23 (ref 13–35)
Alkaline Phosphatase: 100 (ref 25–125)

## 2023-02-27 LAB — BASIC METABOLIC PANEL
BUN: 27 — AB (ref 4–21)
CO2: 24 — AB (ref 13–22)
Chloride: 99 (ref 99–108)
Creatinine: 1.7 — AB (ref 0.5–1.1)
Glucose: 83
Potassium: 4.2 meq/L (ref 3.5–5.1)
Sodium: 133 — AB (ref 137–147)

## 2023-02-27 LAB — COMPREHENSIVE METABOLIC PANEL
Albumin: 3.1 — AB (ref 3.5–5.0)
Calcium: 8.8 (ref 8.7–10.7)

## 2023-02-27 LAB — CBC AND DIFFERENTIAL
HCT: 32 — AB (ref 36–46)
Hemoglobin: 11.1 — AB (ref 12.0–16.0)
Platelets: 178 10*3/uL (ref 150–400)
WBC: 6

## 2023-02-27 LAB — CBC: RBC: 3.23 — AB (ref 3.87–5.11)

## 2023-03-20 DIAGNOSIS — I5032 Chronic diastolic (congestive) heart failure: Secondary | ICD-10-CM | POA: Diagnosis not present

## 2023-03-20 DIAGNOSIS — J9601 Acute respiratory failure with hypoxia: Secondary | ICD-10-CM | POA: Diagnosis not present

## 2023-03-20 DIAGNOSIS — G3184 Mild cognitive impairment, so stated: Secondary | ICD-10-CM | POA: Diagnosis not present

## 2023-03-20 DIAGNOSIS — R1312 Dysphagia, oropharyngeal phase: Secondary | ICD-10-CM | POA: Diagnosis not present

## 2023-03-20 DIAGNOSIS — Z515 Encounter for palliative care: Secondary | ICD-10-CM | POA: Diagnosis not present

## 2023-03-22 ENCOUNTER — Encounter: Payer: Self-pay | Admitting: Adult Health

## 2023-03-22 ENCOUNTER — Non-Acute Institutional Stay (SKILLED_NURSING_FACILITY): Payer: Medicare Other | Admitting: Adult Health

## 2023-03-22 DIAGNOSIS — J4540 Moderate persistent asthma, uncomplicated: Secondary | ICD-10-CM | POA: Diagnosis not present

## 2023-03-22 DIAGNOSIS — R1312 Dysphagia, oropharyngeal phase: Secondary | ICD-10-CM | POA: Diagnosis not present

## 2023-03-22 DIAGNOSIS — I10 Essential (primary) hypertension: Secondary | ICD-10-CM

## 2023-03-22 DIAGNOSIS — E039 Hypothyroidism, unspecified: Secondary | ICD-10-CM

## 2023-03-22 DIAGNOSIS — I5032 Chronic diastolic (congestive) heart failure: Secondary | ICD-10-CM

## 2023-03-22 DIAGNOSIS — E871 Hypo-osmolality and hyponatremia: Secondary | ICD-10-CM

## 2023-03-22 DIAGNOSIS — N184 Chronic kidney disease, stage 4 (severe): Secondary | ICD-10-CM

## 2023-03-22 DIAGNOSIS — K219 Gastro-esophageal reflux disease without esophagitis: Secondary | ICD-10-CM

## 2023-03-22 NOTE — Progress Notes (Signed)
[facility] Room Number: 112-A Place of Service:  SNF [phone]) Provider:  Nikki Dom, NP  PCP: Mahlon Gammon, MD  Patient Care Team: Mahlon Gammon, MD as PCP - General (Internal Medicine) Jake Bathe, MD as PCP - Cardiology (Cardiology)  Extended Emergency Contact Information Primary Emergency Contact: Hursey,Sarah Address: [address]          [place], [place] [address] [place] of [place] Phone: [phone] Relation: Daughter Secondary Emergency Contact: Merrily Pew Address: [address]          [place], [place] [address] D[place] of [place] Home Phone: [phone] Mobile Phone: [phone] Relation: Spouse  Code Status:  DNR  Goals of care: Advanced Directive information    03/22/2023    4:23 PM  Advanced Directives  Does Patient Have a Medical Advance Directive? Yes  Type of Estate agent of [place];Living will;Out of facility DNR (pink MOST or yellow form)  Does patient want to make changes to medical advance directive? No - Patient declined  Copy of Healthcare Power of Attorney in Chart? Yes - validated most recent copy scanned in chart (See row information)     Chief Complaint  Patient presents with   Medical Management of Chronic Issues    Routine Visit.    Immunizations    Discuss the need for Influenza vaccine, and Covid Booster.     HPI:  Pt is a 89 y.o. female seen today for medical management of chronic diseases.    PMH permanent afib, off eliquis due to GIB with cameron erosions on EGD 01/09/19, asthma, breast ca, also with hypothyroidism, CKD III, CHF with HF pEF, HTN   She has seen palliative care. Goals of care are comfort based Now living in skilled care with no acute complaints.   Had a fall in September due to a syncope episode after a BM 02/16/23 and has some right knee pain, using tylenol for this which helps. Xray was negative for acute  fracture.  Able to ambulate with walker, WC for longer distances.   Her weight has been trending upward, but no change in resp symptoms or edema. Very good appetite she says. Wt Readings from Last 3 Encounters:  03/22/23 152 lb (68.9 kg)  02/19/23 151 lb 1.6 oz (68.5 kg)  01/17/23 147 lb 4.8 oz (66.8 kg)    2D echo 09/07/22 EF 70-75% bilateral atrial enlargement and grade 2 diastolic dysfunction.   For hx of asthma and wheezing she was placed on brovana and pulmicort  May of 2024 No further issues since then with exacerbation.   CKD IV BUN 27 Cr 1.7, improved from prior labs  Hypothyroidism Lab Results  Component Value Date   TSH 3.14 09/28/2022   Esophageal dysmotility  and moderate to large hiatal hernia found on Esophogram EGD 2020 cameron lesions found  Past Medical History:  Diagnosis Date   Actinic keratoses    Arthritis    back    Atrial fibrillation with RVR (HCC)    Breast cancer (HCC)    Breast cancer (right)   Chronic diastolic CHF (congestive heart failure) (HCC)    CKD (chronic kidney disease), stage III (HCC)    Compression fracture of L3 lumbar vertebra    Dysphagia    GERD (gastroesophageal reflux disease)    takes otc Pepcid as needed   Hiatal hernia 11/27/2014   HTN (hypertension)    Hypothyroidism    Insomnia  Osteopenia    PAF (paroxysmal atrial fibrillation) (HCC)    a. questionable episode in 2015 in [facility]. b. event monitor through December/January 2014/2015 which showed no evidence of atrial fibrillation. c. Dx 12/2018.   PAT (paroxysmal atrial tachycardia) (HCC)    Pleural effusion    Past Surgical History:  Procedure Laterality Date   BREAST LUMPECTOMY Right 1995   lumpectomy   COLONOSCOPY     ESOPHAGOGASTRODUODENOSCOPY (EGD) WITH PROPOFOL Left 01/09/2019   Procedure: ESOPHAGOGASTRODUODENOSCOPY (EGD) WITH PROPOFOL;  Surgeon: Jeani Hawking, MD;  Location: [facility];  Service: Endoscopy;  Laterality: Left;   EYE SURGERY Bilateral    cataract  w/ lens implant   KYPHOPLASTY N/A 09/18/2013   Procedure: KYPHOPLASTY;  Surgeon: Emilee Hero, MD;  Location: [facility] OR;  Service: Orthopedics;  Laterality: N/A;  Lumbar 3 kyphoplasty   TONSILLECTOMY      Allergies  Allergen Reactions   Cardizem [Diltiazem]    Eliquis [Apixaban]    Tape Other (See Comments)    Patient prefers easy-release tape    Allergies as of 03/22/2023       Reactions   Cardizem [diltiazem]    Eliquis [apixaban]    Tape Other (See Comments)   Patient prefers easy-release tape        Medication List        Accurate as of March 22, 2023  4:30 PM. If you have any questions, ask your nurse or doctor.          STOP taking these medications    pantoprazole 40 MG tablet Commonly known as: PROTONIX Stopped by: Fletcher Anon       TAKE these medications    acetaminophen 500 MG tablet Commonly known as: TYLENOL Take 1,000 mg by mouth in the morning and at bedtime.   acetaminophen 500 MG tablet Commonly known as: TYLENOL Take 2 tablets (1,000 mg total) by mouth every 8 (eight) hours as needed.   Align 4 MG Caps Take 4 mg by mouth daily.   arformoterol 15 MCG/2ML Nebu Commonly known as: Brovana Take 2 mLs (15 mcg total) by nebulization in the morning and at bedtime.   B-complex with vitamin C tablet Take 1 tablet by mouth daily.   benzonatate 100 MG capsule Commonly known as: TESSALON Take 100 mg by mouth 3 (three) times daily as needed for cough.   budesonide 0.5 MG/2ML nebulizer solution Commonly known as: Pulmicort Take 2 mLs (0.5 mg total) by nebulization in the morning and at bedtime.   docusate sodium 100 MG capsule Commonly known as: COLACE Take 100 mg by mouth daily.   Ensure Take 237 mLs by mouth 2 (two) times daily between meals.   furosemide 40 MG tablet Commonly known as: LASIX Take 1 tablet (40 mg total) by mouth daily.   ipratropium-albuterol 0.5-2.5 (3) MG/3ML Soln Commonly known as: DUONEB Take 3 mLs  by nebulization every 6 (six) hours as needed (Wheezing/SOB).   levothyroxine 112 MCG tablet Commonly known as: SYNTHROID Take 112 mcg by mouth every morning.   melatonin 5 MG Tabs Take 5 mg by mouth at bedtime.   metoprolol tartrate 25 MG tablet Commonly known as: LOPRESSOR Take 0.5 tablets (12.5 mg total) by mouth 2 (two) times daily.   Mucinex 600 MG 12 hr tablet Generic drug: guaiFENesin Take 600 mg by mouth in the morning.   guaiFENesin 600 MG 12 hr tablet Commonly known as: MUCINEX Take 600 mg by mouth daily as needed for cough.   nitroGLYCERIN  0.4 MG SL tablet Commonly known as: NITROSTAT Place 1 tablet (0.4 mg total) under the tongue every 5 (five) minutes as needed for chest pain.   ondansetron 4 MG tablet Commonly known as: ZOFRAN Take 4 mg by mouth every 6 (six) hours as needed for nausea or vomiting.   polyethylene glycol powder 17 GM/SCOOP powder Commonly known as: GLYCOLAX/MIRALAX Take 17 g by mouth daily as needed.   potassium chloride 10 MEQ tablet Commonly known as: KLOR-CON M Take 1 tablet (10 mEq total) by mouth daily.   sennosides-docusate sodium 8.6-50 MG tablet Commonly known as: SENOKOT-S Take 1 tablet by mouth at bedtime as needed for constipation.   Vitamin D3 50 MCG (2000 UT) Tabs Take 2,000 Units by mouth daily.        Review of Systems  Constitutional:  Negative for activity change, appetite change, chills, diaphoresis, fatigue, fever and unexpected weight change.  HENT:  Negative for congestion.   Respiratory:  Negative for cough, shortness of breath and wheezing.   Cardiovascular:  Negative for chest pain, palpitations and leg swelling.  Gastrointestinal:  Negative for abdominal distention, abdominal pain, constipation and diarrhea.  Genitourinary:  Negative for difficulty urinating and dysuria.  Musculoskeletal:  Positive for arthralgias and gait problem. Negative for back pain, joint swelling and myalgias.  Neurological:   Negative for dizziness, tremors, seizures, syncope, facial asymmetry, speech difficulty, weakness, light-headedness, numbness and headaches.  Psychiatric/Behavioral:  Negative for agitation, behavioral problems and confusion.     Immunization History  Administered Date(s) Administered   Influenza Split 02/24/2010, 02/24/2014   Influenza, High Dose Seasonal PF 04/07/2015, 03/27/2018   Influenza,inj,Quad PF,6+ Mos 02/21/2016, 04/04/2017   Influenza-Unspecified 04/19/2012, 03/26/2013, 02/14/2019   Moderna Covid-19 Fall Seasonal Vaccine 89yrs & older 04/10/2022   Moderna SARS-COV2 Booster Vaccination 10/05/2021   Moderna Sars-Covid-2 Vaccination 06/15/2019, 07/18/2019, 04/28/2020   PNEUMOCOCCAL CONJUGATE-20 08/09/2021, 08/14/2022   Pneumococcal Conjugate-13 08/19/2013   Tdap 08/11/2011, 10/25/2022   Zoster Recombinant(Shingrix) 11/18/2020, 03/10/2022   Zoster, Live 11/18/2020, 03/07/2021   Pertinent  Health Maintenance Due  Topic Date Due   INFLUENZA VACCINE  01/04/2023   DEXA SCAN  Completed      01/09/2019    8:23 AM 01/09/2019   10:26 PM 01/10/2019   11:08 AM 10/26/2022   10:55 AM 02/19/2023   10:00 AM  Fall Risk  Falls in the past year?    0 0  Was there an injury with Fall?    0 0  Fall Risk Category Calculator    0 0  (RETIRED) Patient Fall Risk Level High fall risk High fall risk High fall risk    Patient at Risk for Falls Due to    No Fall Risks No Fall Risks  Fall risk Follow up    Falls evaluation completed Falls evaluation completed   Functional Status Survey:    Vitals:   03/22/23 1617  BP: 111/72  Pulse: (!) 59  Resp: 16  Temp: (!) 96.8 F (36 C)  SpO2: 98%  Weight: 152 lb (68.9 kg)  Height: 5\' 3"  (1.6 m)   Body mass index is 26.93 kg/m. Physical Exam Vitals and nursing note reviewed.  Constitutional:      General: She is not in acute distress.    Appearance: She is not diaphoretic.  HENT:     Head: Normocephalic and atraumatic.     Mouth/Throat:      Mouth: Mucous membranes are moist.     Pharynx: Oropharynx is clear.  Neck:  Vascular: No JVD.  Cardiovascular:     Rate and Rhythm: Normal rate and regular rhythm.     Heart sounds: No murmur heard. Pulmonary:     Effort: Pulmonary effort is normal. No respiratory distress.     Breath sounds: Normal breath sounds. No wheezing.  Abdominal:     General: Bowel sounds are normal. There is no distension.     Palpations: Abdomen is soft.     Tenderness: There is no abdominal tenderness.  Musculoskeletal:     Comments: BLE edema +1  Skin:    General: Skin is warm and dry.  Neurological:     Mental Status: She is alert and oriented to person, place, and time.  Psychiatric:        Mood and Affect: Mood normal.     Labs reviewed: Recent Labs    09/04/22 0750 09/05/22 0556 09/06/22 0540 09/07/22 0915 09/14/22 0000 11/02/22 0000 11/23/22 0000 02/27/23 0000  NA 134* 137 137 137   < > 137 138 133*  K 3.8 4.1 4.0 4.0   < > 4.6 4.5 4.2  CL 106 110 110 104   < > 100 103 99  CO2 22 22 23 25    < > 26* 23* 24*  GLUCOSE 75 77 85 101*  --   --   --   --   BUN 14 15 18 18    < > 29* 29* 27*  CREATININE 1.15* 1.16* 1.26* 1.26*   < > 1.7* 1.9* 1.7*  CALCIUM 8.4* 7.9* 8.6* 8.7*   < > 8.9 8.9 8.8  PHOS 2.5 3.4 3.0  --   --   --   --   --    < > = values in this interval not displayed.   Recent Labs    09/02/22 1137 09/03/22 0642 09/04/22 0750 09/05/22 0556 09/06/22 0540 09/14/22 0000 02/27/23 0000  AST 329* 239* 208*  --   --  61* 23  ALT 286* 240* 220*  --   --  65* 8  ALKPHOS 166* 151* 161*  --   --  223* 100  BILITOT 0.8 0.9 1.0  --   --   --   --   PROT 6.4* 5.5* 6.1*  --   --   --   --   ALBUMIN 2.9* 2.3* 2.5*  2.5*   < > 2.4* 3.0* 3.1*   < > = values in this interval not displayed.   Recent Labs    09/01/22 2037 09/02/22 0713 09/03/22 0642 09/07/22 0915 09/14/22 0000 09/19/22 0000 02/27/23 0000  WBC 7.5 8.7 7.8 8.3 10.4 12.0 6.0  NEUTROABS 5.1  --  5.2 5.3  --    --   --   HGB 12.6 12.1 11.8* 12.6 12.0 12.2 11.1*  HCT 38.4 36.1 35.0* 38.8 35* 36 32*  MCV 96.2 95.0 95.6 97.7  --   --   --   PLT 173 194 PLATELET CLUMPS NOTED ON SMEAR, UNABLE TO ESTIMATE 144*  --  130* 178   Lab Results  Component Value Date   TSH 3.14 09/28/2022   No results found for: "HGBA1C" Lab Results  Component Value Date   CHOL 128 12/13/2018   HDL 79 12/13/2018   LDLCALC 36 12/13/2018   TRIG 63 12/13/2018   CHOLHDL 1.6 12/13/2018    Significant Diagnostic Results in last 30 days:  No results found.  Assessment/Plan  1. Chronic heart failure with preserved ejection fraction (HFpEF) (HCC) Has  a gained a few lbs but does not appear to be fluid related Continue lasix at 40 mg every day  Monitor weight Leg elevation Low sodium diet.   2. Essential hypertension Controlled  Continue lopressor   3. Hypothyroidism, unspecified type TSH will be checked in Nov due to manufacturer's change   4. Gastroesophageal reflux disease without esophagitis Continue Protonix  Prior hx of cameron ulcer  5. CKD (chronic kidney disease), stage IV (HCC) Continue to periodically monitor BMP and avoid nephrotoxic agents  6. Hyponatremia NA 133 Continue to monitor  7. Oropharyngeal dysphagia Prior hx of aspiration Now on a regular diet with no issues.   8. Asthma Moderate Doing well on Brovana and Pulmicort   Family/ staff Communication:   Labs/tests ordered:

## 2023-03-23 ENCOUNTER — Encounter: Payer: Self-pay | Admitting: Adult Health

## 2023-03-23 MED ORDER — PANTOPRAZOLE SODIUM 40 MG PO TBEC: 40.0000 mg | DELAYED_RELEASE_TABLET | Freq: Every day | ORAL | Status: DC

## 2023-04-27 DIAGNOSIS — E039 Hypothyroidism, unspecified: Secondary | ICD-10-CM | POA: Diagnosis not present

## 2023-05-01 ENCOUNTER — Non-Acute Institutional Stay (SKILLED_NURSING_FACILITY): Payer: Medicare Other | Admitting: Orthopedic Surgery

## 2023-05-01 ENCOUNTER — Encounter: Payer: Self-pay | Admitting: Orthopedic Surgery

## 2023-05-01 DIAGNOSIS — I5032 Chronic diastolic (congestive) heart failure: Secondary | ICD-10-CM | POA: Diagnosis not present

## 2023-05-01 DIAGNOSIS — I10 Essential (primary) hypertension: Secondary | ICD-10-CM | POA: Diagnosis not present

## 2023-05-01 DIAGNOSIS — J4540 Moderate persistent asthma, uncomplicated: Secondary | ICD-10-CM

## 2023-05-01 DIAGNOSIS — D692 Other nonthrombocytopenic purpura: Secondary | ICD-10-CM | POA: Diagnosis not present

## 2023-05-01 DIAGNOSIS — N184 Chronic kidney disease, stage 4 (severe): Secondary | ICD-10-CM

## 2023-05-01 DIAGNOSIS — E039 Hypothyroidism, unspecified: Secondary | ICD-10-CM | POA: Diagnosis not present

## 2023-05-01 DIAGNOSIS — R1312 Dysphagia, oropharyngeal phase: Secondary | ICD-10-CM

## 2023-05-01 DIAGNOSIS — K219 Gastro-esophageal reflux disease without esophagitis: Secondary | ICD-10-CM

## 2023-05-01 MED ORDER — METOPROLOL TARTRATE 25 MG PO TABS: 12.5000 mg | ORAL_TABLET | Freq: Two times a day (BID) | ORAL | Status: DC

## 2023-05-01 NOTE — Progress Notes (Signed)
Location:  [facility] Room Number: 112/A Place of Service:  SNF [phone]) Provider:  Octavia Heir, NP   Mahlon Gammon, MD  Patient Care Team: Mahlon Gammon, MD as PCP - General (Internal Medicine) Jake Bathe, MD as PCP - Cardiology (Cardiology)  Extended Emergency Contact Information Primary Emergency Contact: Veal,Sarah Address: [address]          [place], [place] [address] D[place] of [place] Phone: [phone] Relation: Daughter Secondary Emergency Contact: Merrily Pew Address: [address]          [place], [place] [address] Darden Amber of [place] Home Phone: [phone] Mobile Phone: [phone] Relation: Spouse  Code Status:  DNR Goals of care: Advanced Directive information    03/22/2023    4:23 PM  Advanced Directives  Does Patient Have a Medical Advance Directive? Yes  Type of Estate agent of [place];Living will;Out of facility DNR (pink MOST or yellow form)  Does patient want to make changes to medical advance directive? No - Patient declined  Copy of Healthcare Power of Attorney in Chart? Yes - validated most recent copy scanned in chart (See row information)     Chief Complaint  Patient presents with   Medical Management of Chronic Issues    HPI:  Pt is a 89 y.o. female seen today for medical management of chronic diseases.    She currently resides on the skilled nursing unit at [facility]. PMH: PAF> off anticoagulation due to GIB/ Sheria Lang erosion s/p EGD 01/09/2019, CFH HFpEF, HTN, CKD, recent pneumonia, dysphagia, GERD, hypothyroidism, transaminitis, breast cancer and weakness.   Followed by palliative. Goals of care comfort based.    HTN- BUN/creat 27/1.7 02/27/2023, remains on metoprolol CKD- GFR 27 (09/24)> was 27 (05/30) CHF- 2D echo LVEF 70-75% 09/2022, remains on furosemide/KCL and weekly weights Asthma- remains on Brovana, pulmicort and duonebs prn GERD-  hgb 11.1 02/27/2023, h/o hiatal hernia, remains on Protonix Dysphagia- hospitalized 03/29-04/04 due to aspiration pneumonia> given ceftriaxone and vancomycin, EGD questioned distal esophageal stricture, followed by ST, remains on DYS3/chopped diet Hypothyroidism- TSH 3.14 09/2022, remains on levothyroxine  Flu/covid vaccinations given 10/22> tolerated well per [facility].   Recent weights:  11/12- 154.4 lbs  11/02- 150 lbs  10/01- 152 lbs  04/08- 143 lbs   Recent blood pressures:  11/26- 129/78  11/12- 131/81  11/05- 139/66 Past Medical History:  Diagnosis Date   Actinic keratoses    Arthritis    back    Atrial fibrillation with RVR (HCC)    Breast cancer (HCC)    Breast cancer (right)   Chronic diastolic CHF (congestive heart failure) (HCC)    CKD (chronic kidney disease), stage III (HCC)    Compression fracture of L3 lumbar vertebra    Dysphagia    GERD (gastroesophageal reflux disease)    takes otc Pepcid as needed   Hiatal hernia 11/27/2014   HTN (hypertension)    Hypothyroidism    Insomnia    Osteopenia    PAF (paroxysmal atrial fibrillation) (HCC)    a. questionable episode in 2015 in . b. event monitor through December/January 2014/2015 which showed no evidence of atrial fibrillation. c. Dx 12/2018.   PAT (paroxysmal atrial tachycardia) (HCC)    Pleural effusion    Past Surgical History:  Procedure Laterality Date   BREAST LUMPECTOMY Right 1995   lumpectomy   COLONOSCOPY     ESOPHAGOGASTRODUODENOSCOPY (EGD) WITH PROPOFOL Left 01/09/2019   Procedure: ESOPHAGOGASTRODUODENOSCOPY (EGD) WITH  PROPOFOL;  Surgeon: Jeani Hawking, MD;  Location: [facility];  Service: Endoscopy;  Laterality: Left;   EYE SURGERY Bilateral    cataract w/ lens implant   KYPHOPLASTY N/A 09/18/2013   Procedure: KYPHOPLASTY;  Surgeon: Emilee Hero, MD;  Location: [facility] OR;  Service: Orthopedics;  Laterality: N/A;  Lumbar 3 kyphoplasty   TONSILLECTOMY      Allergies  Allergen  Reactions   Cardizem [Diltiazem]    Eliquis [Apixaban]    Tape Other (See Comments)    Patient prefers easy-release tape    Outpatient Encounter Medications as of 05/01/2023  Medication Sig   acetaminophen (TYLENOL) 500 MG tablet Take 2 tablets (1,000 mg total) by mouth every 8 (eight) hours as needed.   acetaminophen (TYLENOL) 500 MG tablet Take 1,000 mg by mouth in the morning and at bedtime.   arformoterol (BROVANA) 15 MCG/2ML NEBU Take 2 mLs (15 mcg total) by nebulization in the morning and at bedtime.   B Complex-C (B-COMPLEX WITH VITAMIN C) tablet Take 1 tablet by mouth daily.   benzonatate (TESSALON) 100 MG capsule Take 100 mg by mouth 3 (three) times daily as needed for cough.   budesonide (PULMICORT) 0.5 MG/2ML nebulizer solution Take 2 mLs (0.5 mg total) by nebulization in the morning and at bedtime.   Cholecalciferol (VITAMIN D3) 2000 units TABS Take 2,000 Units by mouth daily.    docusate sodium (COLACE) 100 MG capsule Take 100 mg by mouth daily.   Ensure (ENSURE) Take 237 mLs by mouth daily as needed.   furosemide (LASIX) 40 MG tablet Take 1 tablet (40 mg total) by mouth daily.   guaiFENesin (MUCINEX) 600 MG 12 hr tablet Take 600 mg by mouth in the morning.   guaiFENesin (MUCINEX) 600 MG 12 hr tablet Take 600 mg by mouth daily as needed for cough.   ipratropium-albuterol (DUONEB) 0.5-2.5 (3) MG/3ML SOLN Take 3 mLs by nebulization every 6 (six) hours as needed (Wheezing/SOB).   levothyroxine (SYNTHROID) 112 MCG tablet Take 112 mcg by mouth every morning.   melatonin 5 MG TABS Take 5 mg by mouth at bedtime.   metoprolol tartrate (LOPRESSOR) 25 MG tablet Take 0.5 tablets (12.5 mg total) by mouth 2 (two) times daily.   nitroGLYCERIN (NITROSTAT) 0.4 MG SL tablet Place 1 tablet (0.4 mg total) under the tongue every 5 (five) minutes as needed for chest pain.   ondansetron (ZOFRAN) 4 MG tablet Take 4 mg by mouth every 6 (six) hours as needed for nausea or vomiting.   pantoprazole  (PROTONIX) 40 MG tablet Take 1 tablet (40 mg total) by mouth daily.   polyethylene glycol powder (GLYCOLAX/MIRALAX) 17 GM/SCOOP powder Take 17 g by mouth daily as needed.   potassium chloride (KLOR-CON M) 10 MEQ tablet Take 1 tablet (10 mEq total) by mouth daily.   Probiotic Product (ALIGN) 4 MG CAPS Take 4 mg by mouth daily.   sennosides-docusate sodium (SENOKOT-S) 8.6-50 MG tablet Take 1 tablet by mouth at bedtime as needed for constipation.   Facility-Administered Encounter Medications as of 05/01/2023  Medication   bisacodyl (DULCOLAX) suppository 10 mg    Review of Systems  Constitutional:  Negative for activity change and appetite change.  HENT:  Positive for hearing loss and trouble swallowing.   Eyes:  Negative for visual disturbance.  Respiratory:  Negative for shortness of breath.   Cardiovascular:  Negative for chest pain and leg swelling.  Gastrointestinal:  Negative for abdominal distention and abdominal pain.  Genitourinary:  Negative for  dysuria.  Musculoskeletal:  Positive for gait problem.  Skin:  Negative for wound.  Neurological:  Positive for weakness. Negative for dizziness and headaches.  Psychiatric/Behavioral:  Negative for dysphoric mood and sleep disturbance. The patient is not nervous/anxious.     Immunization History  Administered Date(s) Administered   Influenza Split 02/24/2010, 02/24/2014   Influenza, High Dose Seasonal PF 04/07/2015, 03/27/2018   Influenza,inj,Quad PF,6+ Mos 02/21/2016, 04/04/2017   Influenza-Unspecified 04/19/2012, 03/26/2013, 02/14/2019   Moderna Covid-19 Fall Seasonal Vaccine 62yrs & older 04/10/2022   Moderna SARS-COV2 Booster Vaccination 10/05/2021   Moderna Sars-Covid-2 Vaccination 06/15/2019, 07/18/2019, 04/28/2020   PNEUMOCOCCAL CONJUGATE-20 08/09/2021, 08/14/2022   Pneumococcal Conjugate-13 08/19/2013   Tdap 08/11/2011, 10/25/2022   Zoster Recombinant(Shingrix) 11/18/2020, 03/10/2022   Zoster, Live 11/18/2020, 03/07/2021    Pertinent  Health Maintenance Due  Topic Date Due   INFLUENZA VACCINE  01/04/2023   DEXA SCAN  Completed      01/09/2019    8:23 AM 01/09/2019   10:26 PM 01/10/2019   11:08 AM 10/26/2022   10:55 AM 02/19/2023   10:00 AM  Fall Risk  Falls in the past year?    0 0  Was there an injury with Fall?    0 0  Fall Risk Category Calculator    0 0  (RETIRED) Patient Fall Risk Level High fall risk High fall risk High fall risk    Patient at Risk for Falls Due to    No Fall Risks No Fall Risks  Fall risk Follow up    Falls evaluation completed Falls evaluation completed   Functional Status Survey:    Vitals:   05/01/23 1348  BP: 131/81  Pulse: 66  Resp: 18  Temp: (!) 97.3 F (36.3 C)  SpO2: 97%  Weight: 154 lb 6.4 oz (70 kg)  Height: 5\' 3"  (1.6 m)   Body mass index is 27.35 kg/m. Physical Exam Vitals reviewed.  Constitutional:      General: She is not in acute distress. HENT:     Head: Normocephalic.     Right Ear: There is no impacted cerumen.     Left Ear: There is no impacted cerumen.     Nose: Nose normal.     Mouth/Throat:     Mouth: Mucous membranes are moist.  Eyes:     General:        Right eye: No discharge.        Left eye: No discharge.  Cardiovascular:     Rate and Rhythm: Normal rate and regular rhythm.     Pulses: Normal pulses.     Heart sounds: Normal heart sounds.  Pulmonary:     Effort: Pulmonary effort is normal. No respiratory distress.     Breath sounds: Normal breath sounds. No wheezing or rales.  Abdominal:     General: Bowel sounds are normal.     Palpations: Abdomen is soft.  Musculoskeletal:     Cervical back: Neck supple.     Right lower leg: No edema.     Left lower leg: No edema.  Skin:    General: Skin is warm.     Capillary Refill: Capillary refill takes less than 2 seconds.     Comments: Non tender purple lesions to extremities, vary in size, no skin breakdown or swelling.  Neurological:     General: No focal deficit present.      Mental Status: She is alert and oriented to person, place, and time.     Motor:  Weakness present.     Gait: Gait abnormal.  Psychiatric:        Mood and Affect: Mood normal.     Labs reviewed: Recent Labs    09/04/22 0750 09/05/22 0556 09/06/22 0540 09/07/22 0915 09/14/22 0000 11/02/22 0000 11/23/22 0000 02/27/23 0000  NA 134* 137 137 137   < > 137 138 133*  K 3.8 4.1 4.0 4.0   < > 4.6 4.5 4.2  CL 106 110 110 104   < > 100 103 99  CO2 22 22 23 25    < > 26* 23* 24*  GLUCOSE 75 77 85 101*  --   --   --   --   BUN 14 15 18 18    < > 29* 29* 27*  CREATININE 1.15* 1.16* 1.26* 1.26*   < > 1.7* 1.9* 1.7*  CALCIUM 8.4* 7.9* 8.6* 8.7*   < > 8.9 8.9 8.8  PHOS 2.5 3.4 3.0  --   --   --   --   --    < > = values in this interval not displayed.   Recent Labs    09/02/22 1137 09/03/22 0642 09/04/22 0750 09/05/22 0556 09/06/22 0540 09/14/22 0000 02/27/23 0000  AST 329* 239* 208*  --   --  61* 23  ALT 286* 240* 220*  --   --  65* 8  ALKPHOS 166* 151* 161*  --   --  223* 100  BILITOT 0.8 0.9 1.0  --   --   --   --   PROT 6.4* 5.5* 6.1*  --   --   --   --   ALBUMIN 2.9* 2.3* 2.5*  2.5*   < > 2.4* 3.0* 3.1*   < > = values in this interval not displayed.   Recent Labs    09/01/22 2037 09/02/22 0713 09/03/22 0642 09/07/22 0915 09/14/22 0000 09/19/22 0000 02/27/23 0000  WBC 7.5 8.7 7.8 8.3 10.4 12.0 6.0  NEUTROABS 5.1  --  5.2 5.3  --   --   --   HGB 12.6 12.1 11.8* 12.6 12.0 12.2 11.1*  HCT 38.4 36.1 35.0* 38.8 35* 36 32*  MCV 96.2 95.0 95.6 97.7  --   --   --   PLT 173 194 PLATELET CLUMPS NOTED ON SMEAR, UNABLE TO ESTIMATE 144*  --  130* 178   Lab Results  Component Value Date   TSH 3.14 09/28/2022   No results found for: "HGBA1C" Lab Results  Component Value Date   CHOL 128 12/13/2018   HDL 79 12/13/2018   LDLCALC 36 12/13/2018   TRIG 63 12/13/2018   CHOLHDL 1.6 12/13/2018    Significant Diagnostic Results in last 30 days:  No results  found.  Assessment/Plan 1. Essential hypertension - controlled with metoprolol - metoprolol tartrate (LOPRESSOR) 25 MG tablet; Take 0.5 tablets (12.5 mg total) by mouth 2 (two) times daily.  2. CKD (chronic kidney disease), stage IV (HCC) - GFR 27 09/24 and 09/2022 - encourage hydration with water - avoid NSAIDS - on furosemide for CHF   3. Chronic heart failure with preserved ejection fraction (HFpEF) (HCC) - some weight gain x 2-3 months - lung sounds clear, no acute cough or edema - cont furosemide   4. Moderate persistent asthma without complication - no recent exacerbations - cont Brovana and Pulmicort  5. Gastroesophageal reflux disease without esophagitis - hgb stable - cont pantoprazole  6. Oropharyngeal dysphagia - no recent aspirations - cont  regular diet and fluids  7. Acquired hypothyroidism - TSH normal 09/2022 - cont levothyroxine - TSH recheck scheduled 06/2023  8. Senile purpura (HCC) -education given    Family/ staff Communication: plan discussed with patient and nurse  Labs/tests ordered:  none

## 2023-05-23 ENCOUNTER — Encounter: Payer: Self-pay | Admitting: Orthopedic Surgery

## 2023-05-23 ENCOUNTER — Non-Acute Institutional Stay (SKILLED_NURSING_FACILITY): Payer: Medicare Other | Admitting: Orthopedic Surgery

## 2023-05-23 DIAGNOSIS — J4521 Mild intermittent asthma with (acute) exacerbation: Secondary | ICD-10-CM | POA: Diagnosis not present

## 2023-05-23 MED ORDER — PREDNISONE 20 MG PO TABS
20.0000 mg | ORAL_TABLET | Freq: Every day | ORAL | Status: DC
Start: 2023-05-23 — End: 2023-05-29

## 2023-05-23 NOTE — Progress Notes (Signed)
Location:  [facility] Room Number: 112-A Place of Service:  SNF [phone]) Provider:  Venissa Nappi Roland Earl, MD  Patient Care Team: Mahlon Gammon, MD as PCP - General (Internal Medicine) Jake Bathe, MD as PCP - Cardiology (Cardiology)  Extended Emergency Contact Information Primary Emergency Contact: Desa,Sarah Address: [address]          [place], [place] [address] D[place] of [place] Phone: [phone] Relation: Daughter Secondary Emergency Contact: Merrily Pew Address: [address]          [place], [place] [address] D[place] of [place] Home Phone: [phone] Mobile Phone: [phone] Relation: Spouse  Code Status:  DNR Goals of care: Advanced Directive information    03/22/2023    4:23 PM  Advanced Directives  Does Patient Have a Medical Advance Directive? Yes  Type of Estate agent of [place];Living will;Out of facility DNR (pink MOST or yellow form)  Does patient want to make changes to medical advance directive? No - Patient declined  Copy of Healthcare Power of Attorney in Chart? Yes - validated most recent copy scanned in chart (See row information)     Chief Complaint  Patient presents with   Acute Visit    Wheezing    HPI:  Pt is a 89 y.o. female seen today for acute visit due to wheezing.   She currently resides on the skilled nursing unit at [facility]. PMH: PAF> off anticoagulation due to GIB/ Sheria Lang erosion s/p EGD 01/09/2019, CFH HFpEF, HTN, CKD, recent pneumonia, dysphagia, GERD, hypothyroidism, transaminitis, breast cancer and weakness.   H/o asthma. Nursing reports increased wheezing x 1 day. Remains on Brovana and pulmicort. She refused duoneb treatment this morning. She denies shortness of breath. She is unsure if she is wheezing. Denies allergies. Afebrile. Vitals stable.      Past Medical History:  Diagnosis Date   Actinic keratoses     Arthritis    back    Atrial fibrillation with RVR (HCC)    Breast cancer (HCC)    Breast cancer (right)   Chronic diastolic CHF (congestive heart failure) (HCC)    CKD (chronic kidney disease), stage III (HCC)    Compression fracture of L3 lumbar vertebra    Dysphagia    GERD (gastroesophageal reflux disease)    takes otc Pepcid as needed   Hiatal hernia 11/27/2014   HTN (hypertension)    Hypothyroidism    Insomnia    Osteopenia    PAF (paroxysmal atrial fibrillation) (HCC)    a. questionable episode in 2015 in [place]. b. event monitor through December/January 2014/2015 which showed no evidence of atrial fibrillation. c. Dx 12/2018.   PAT (paroxysmal atrial tachycardia) (HCC)    Pleural effusion    Past Surgical History:  Procedure Laterality Date   BREAST LUMPECTOMY Right 1995   lumpectomy   COLONOSCOPY     ESOPHAGOGASTRODUODENOSCOPY (EGD) WITH PROPOFOL Left 01/09/2019   Procedure: ESOPHAGOGASTRODUODENOSCOPY (EGD) WITH PROPOFOL;  Surgeon: Jeani Hawking, MD;  Location: [facility];  Service: Endoscopy;  Laterality: Left;   EYE SURGERY Bilateral    cataract w/ lens implant   KYPHOPLASTY N/A 09/18/2013   Procedure: KYPHOPLASTY;  Surgeon: Emilee Hero, MD;  Location: [facility] OR;  Service: Orthopedics;  Laterality: N/A;  Lumbar 3 kyphoplasty   TONSILLECTOMY      Allergies  Allergen Reactions   Cardizem [Diltiazem]    Eliquis [Apixaban]    Tape Other (See Comments)    Patient  prefers easy-release tape    Outpatient Encounter Medications as of 05/23/2023  Medication Sig   acetaminophen (TYLENOL) 500 MG tablet Take 2 tablets (1,000 mg total) by mouth every 8 (eight) hours as needed.   acetaminophen (TYLENOL) 500 MG tablet Take 1,000 mg by mouth in the morning and at bedtime.   arformoterol (BROVANA) 15 MCG/2ML NEBU Take 2 mLs (15 mcg total) by nebulization in the morning and at bedtime.   B Complex-C (B-COMPLEX WITH VITAMIN C) tablet Take 1 tablet by mouth daily.   benzonatate  (TESSALON) 100 MG capsule Take 100 mg by mouth 3 (three) times daily as needed for cough.   budesonide (PULMICORT) 0.5 MG/2ML nebulizer solution Take 2 mLs (0.5 mg total) by nebulization in the morning and at bedtime.   Cholecalciferol (VITAMIN D3) 2000 units TABS Take 2,000 Units by mouth daily.    Ensure (ENSURE) Take 237 mLs by mouth daily as needed.   furosemide (LASIX) 40 MG tablet Take 1 tablet (40 mg total) by mouth daily.   guaiFENesin (MUCINEX) 600 MG 12 hr tablet Take 600 mg by mouth in the morning.   guaiFENesin (MUCINEX) 600 MG 12 hr tablet Take 600 mg by mouth daily as needed for cough.   ipratropium-albuterol (DUONEB) 0.5-2.5 (3) MG/3ML SOLN Take 3 mLs by nebulization every 6 (six) hours as needed (Wheezing/SOB).   melatonin 5 MG TABS Take 5 mg by mouth at bedtime.   metoprolol tartrate (LOPRESSOR) 25 MG tablet Take 0.5 tablets (12.5 mg total) by mouth 2 (two) times daily.   nitroGLYCERIN (NITROSTAT) 0.4 MG SL tablet Place 1 tablet (0.4 mg total) under the tongue every 5 (five) minutes as needed for chest pain.   ondansetron (ZOFRAN) 4 MG tablet Take 4 mg by mouth every 6 (six) hours as needed for nausea or vomiting.   pantoprazole (PROTONIX) 40 MG tablet Take 1 tablet (40 mg total) by mouth daily.   polyethylene glycol powder (GLYCOLAX/MIRALAX) 17 GM/SCOOP powder Take 17 g by mouth daily as needed.   potassium chloride (KLOR-CON M) 10 MEQ tablet Take 1 tablet (10 mEq total) by mouth daily.   Probiotic Product (ALIGN) 4 MG CAPS Take 4 mg by mouth daily.   sennosides-docusate sodium (SENOKOT-S) 8.6-50 MG tablet Take 1 tablet by mouth at bedtime as needed for constipation.   docusate sodium (COLACE) 100 MG capsule Take 100 mg by mouth daily. (Patient not taking: Reported on 05/23/2023)   levothyroxine (SYNTHROID) 112 MCG tablet Take 112 mcg by mouth every morning. (Patient not taking: Reported on 05/23/2023)   Facility-Administered Encounter Medications as of 05/23/2023  Medication    bisacodyl (DULCOLAX) suppository 10 mg    Review of Systems  Constitutional:  Negative for fatigue and fever.  HENT:  Negative for congestion and sore throat.   Respiratory:  Positive for wheezing. Negative for cough and shortness of breath.   Cardiovascular:  Negative for chest pain and leg swelling.  Gastrointestinal:  Negative for nausea and vomiting.  Musculoskeletal:  Negative for myalgias.  Psychiatric/Behavioral:  Negative for dysphoric mood. The patient is not nervous/anxious.     Immunization History  Administered Date(s) Administered   Fluad Quad(high Dose 65+) 03/27/2023   Influenza Split 02/24/2010, 02/24/2014   Influenza, High Dose Seasonal PF 04/07/2015, 03/27/2018   Influenza,inj,Quad PF,6+ Mos 02/21/2016, 04/04/2017   Influenza-Unspecified 04/19/2012, 03/26/2013, 02/14/2019   Moderna Covid-19 Fall Seasonal Vaccine 35yrs & older 04/10/2022   Moderna Covid-19 Vaccine Bivalent Booster 36yrs & up 03/27/2023   Moderna SARS-COV2  Booster Vaccination 10/05/2021   Moderna Sars-Covid-2 Vaccination 06/15/2019, 07/18/2019, 04/28/2020   PNEUMOCOCCAL CONJUGATE-20 08/09/2021, 08/14/2022   Pneumococcal Conjugate-13 08/19/2013   Tdap 08/11/2011, 10/25/2022   Zoster Recombinant(Shingrix) 11/18/2020, 03/10/2022   Zoster, Live 11/18/2020, 03/07/2021   Pertinent  Health Maintenance Due  Topic Date Due   INFLUENZA VACCINE  Completed   DEXA SCAN  Completed      01/09/2019   10:26 PM 01/10/2019   11:08 AM 10/26/2022   10:55 AM 02/19/2023   10:00 AM 05/01/2023    2:03 PM  Fall Risk  Falls in the past year?   0 0 0  Was there an injury with Fall?   0 0 0  Fall Risk Category Calculator   0 0 0  (RETIRED) Patient Fall Risk Level High fall risk High fall risk     Patient at Risk for Falls Due to   No Fall Risks No Fall Risks History of fall(s);Impaired balance/gait;Impaired mobility  Fall risk Follow up   Falls evaluation completed Falls evaluation completed Falls evaluation  completed;Education provided;Falls prevention discussed   Functional Status Survey:    Vitals:   05/23/23 1429  BP: 106/73  Pulse: 72  Resp: 20  Temp: 97.6 F (36.4 C)  SpO2: 98%  Weight: 154 lb 6.4 oz (70 kg)  Height: 5' 3.8" (1.621 m)   Body mass index is 26.67 kg/m. Physical Exam Vitals reviewed.  Constitutional:      General: She is not in acute distress. HENT:     Head: Normocephalic.  Eyes:     General:        Right eye: No discharge.        Left eye: No discharge.  Cardiovascular:     Rate and Rhythm: Normal rate and regular rhythm.     Pulses: Normal pulses.     Heart sounds: Normal heart sounds.  Pulmonary:     Effort: Pulmonary effort is normal. No respiratory distress.     Breath sounds: Examination of the right-upper field reveals wheezing. Examination of the left-upper field reveals wheezing. Examination of the right-middle field reveals wheezing. Examination of the left-middle field reveals wheezing. Wheezing present.  Musculoskeletal:     Cervical back: Neck supple.     Right lower leg: No edema.     Left lower leg: No edema.  Skin:    General: Skin is warm.     Capillary Refill: Capillary refill takes less than 2 seconds.  Neurological:     General: No focal deficit present.     Mental Status: She is alert and oriented to person, place, and time.     Motor: Weakness present.     Gait: Gait abnormal.  Psychiatric:        Mood and Affect: Mood normal.     Labs reviewed: Recent Labs    09/04/22 0750 09/05/22 0556 09/06/22 0540 09/07/22 0915 09/14/22 0000 11/02/22 0000 11/23/22 0000 02/27/23 0000  NA 134* 137 137 137   < > 137 138 133*  K 3.8 4.1 4.0 4.0   < > 4.6 4.5 4.2  CL 106 110 110 104   < > 100 103 99  CO2 22 22 23 25    < > 26* 23* 24*  GLUCOSE 75 77 85 101*  --   --   --   --   BUN 14 15 18 18    < > 29* 29* 27*  CREATININE 1.15* 1.16* 1.26* 1.26*   < > 1.7* 1.9* 1.7*  CALCIUM 8.4* 7.9* 8.6* 8.7*   < > 8.9 8.9 8.8  PHOS 2.5 3.4  3.0  --   --   --   --   --    < > = values in this interval not displayed.   Recent Labs    09/02/22 1137 09/03/22 0642 09/04/22 0750 09/05/22 0556 09/06/22 0540 09/14/22 0000 02/27/23 0000  AST 329* 239* 208*  --   --  61* 23  ALT 286* 240* 220*  --   --  65* 8  ALKPHOS 166* 151* 161*  --   --  223* 100  BILITOT 0.8 0.9 1.0  --   --   --   --   PROT 6.4* 5.5* 6.1*  --   --   --   --   ALBUMIN 2.9* 2.3* 2.5*  2.5*   < > 2.4* 3.0* 3.1*   < > = values in this interval not displayed.   Recent Labs    09/01/22 2037 09/02/22 0713 09/03/22 0642 09/07/22 0915 09/14/22 0000 09/19/22 0000 02/27/23 0000  WBC 7.5 8.7 7.8 8.3 10.4 12.0 6.0  NEUTROABS 5.1  --  5.2 5.3  --   --   --   HGB 12.6 12.1 11.8* 12.6 12.0 12.2 11.1*  HCT 38.4 36.1 35.0* 38.8 35* 36 32*  MCV 96.2 95.0 95.6 97.7  --   --   --   PLT 173 194 PLATELET CLUMPS NOTED ON SMEAR, UNABLE TO ESTIMATE 144*  --  130* 178   Lab Results  Component Value Date   TSH 3.14 09/28/2022   No results found for: "HGBA1C" Lab Results  Component Value Date   CHOL 128 12/13/2018   HDL 79 12/13/2018   LDLCALC 36 12/13/2018   TRIG 63 12/13/2018   CHOLHDL 1.6 12/13/2018    Significant Diagnostic Results in last 30 days:  No results found.  Assessment/Plan 1. Mild intermittent asthma with acute exacerbation (Primary) - onset x 1 day - refusing nebulizer treatments - mild expiratory wheezing to upper and middle lobes - will add oral prednisone x 5 days - cont Brovana and Pulmicort - predniSONE (DELTASONE) 20 MG tablet; Take 1 tablet (20 mg total) by mouth daily with breakfast for 5 days.    Family/ staff Communication: plan discussed with patient, husband and nurse  Labs/tests ordered:  none

## 2023-05-28 ENCOUNTER — Encounter (HOSPITAL_COMMUNITY): Payer: Self-pay | Admitting: Student

## 2023-05-28 ENCOUNTER — Inpatient Hospital Stay (HOSPITAL_COMMUNITY): Payer: Medicare Other

## 2023-05-28 ENCOUNTER — Emergency Department (HOSPITAL_COMMUNITY): Payer: Medicare Other

## 2023-05-28 ENCOUNTER — Other Ambulatory Visit: Payer: Self-pay

## 2023-05-28 ENCOUNTER — Other Ambulatory Visit: Payer: Self-pay | Admitting: Neurology

## 2023-05-28 ENCOUNTER — Inpatient Hospital Stay (HOSPITAL_COMMUNITY)
Admission: EM | Admit: 2023-05-28 | Discharge: 2023-05-29 | DRG: 065 | Disposition: A | Discharge status: Hospice | Payer: Medicare Other | Source: Skilled Nursing Facility | Attending: Emergency Medicine | Admitting: Emergency Medicine

## 2023-05-28 DIAGNOSIS — I10 Essential (primary) hypertension: Secondary | ICD-10-CM | POA: Diagnosis present

## 2023-05-28 DIAGNOSIS — G319 Degenerative disease of nervous system, unspecified: Secondary | ICD-10-CM | POA: Diagnosis not present

## 2023-05-28 DIAGNOSIS — R29818 Other symptoms and signs involving the nervous system: Secondary | ICD-10-CM | POA: Diagnosis not present

## 2023-05-28 DIAGNOSIS — Z515 Encounter for palliative care: Secondary | ICD-10-CM | POA: Diagnosis not present

## 2023-05-28 DIAGNOSIS — Z888 Allergy status to other drugs, medicaments and biological substances status: Secondary | ICD-10-CM

## 2023-05-28 DIAGNOSIS — G4700 Insomnia, unspecified: Secondary | ICD-10-CM | POA: Diagnosis not present

## 2023-05-28 DIAGNOSIS — Z7952 Long term (current) use of systemic steroids: Secondary | ICD-10-CM

## 2023-05-28 DIAGNOSIS — R6889 Other general symptoms and signs: Secondary | ICD-10-CM | POA: Diagnosis not present

## 2023-05-28 DIAGNOSIS — Z1152 Encounter for screening for COVID-19: Secondary | ICD-10-CM

## 2023-05-28 DIAGNOSIS — Z7401 Bed confinement status: Secondary | ICD-10-CM | POA: Diagnosis not present

## 2023-05-28 DIAGNOSIS — Z961 Presence of intraocular lens: Secondary | ICD-10-CM | POA: Diagnosis present

## 2023-05-28 DIAGNOSIS — I63522 Cerebral infarction due to unspecified occlusion or stenosis of left anterior cerebral artery: Secondary | ICD-10-CM | POA: Diagnosis not present

## 2023-05-28 DIAGNOSIS — R000 Tachycardia, unspecified: Secondary | ICD-10-CM | POA: Diagnosis not present

## 2023-05-28 DIAGNOSIS — R404 Transient alteration of awareness: Secondary | ICD-10-CM | POA: Diagnosis not present

## 2023-05-28 DIAGNOSIS — N184 Chronic kidney disease, stage 4 (severe): Secondary | ICD-10-CM | POA: Diagnosis present

## 2023-05-28 DIAGNOSIS — I130 Hypertensive heart and chronic kidney disease with heart failure and stage 1 through stage 4 chronic kidney disease, or unspecified chronic kidney disease: Secondary | ICD-10-CM | POA: Diagnosis present

## 2023-05-28 DIAGNOSIS — I63412 Cerebral infarction due to embolism of left middle cerebral artery: Principal | ICD-10-CM | POA: Diagnosis present

## 2023-05-28 DIAGNOSIS — Z79890 Hormone replacement therapy (postmenopausal): Secondary | ICD-10-CM

## 2023-05-28 DIAGNOSIS — R4701 Aphasia: Secondary | ICD-10-CM | POA: Diagnosis present

## 2023-05-28 DIAGNOSIS — R414 Neurologic neglect syndrome: Secondary | ICD-10-CM | POA: Diagnosis not present

## 2023-05-28 DIAGNOSIS — I639 Cerebral infarction, unspecified: Secondary | ICD-10-CM | POA: Diagnosis not present

## 2023-05-28 DIAGNOSIS — R29810 Facial weakness: Secondary | ICD-10-CM | POA: Diagnosis present

## 2023-05-28 DIAGNOSIS — I480 Paroxysmal atrial fibrillation: Secondary | ICD-10-CM | POA: Diagnosis present

## 2023-05-28 DIAGNOSIS — I499 Cardiac arrhythmia, unspecified: Secondary | ICD-10-CM | POA: Diagnosis not present

## 2023-05-28 DIAGNOSIS — E039 Hypothyroidism, unspecified: Secondary | ICD-10-CM | POA: Diagnosis not present

## 2023-05-28 DIAGNOSIS — Z66 Do not resuscitate: Secondary | ICD-10-CM | POA: Diagnosis not present

## 2023-05-28 DIAGNOSIS — Z91048 Other nonmedicinal substance allergy status: Secondary | ICD-10-CM

## 2023-05-28 DIAGNOSIS — Z7951 Long term (current) use of inhaled steroids: Secondary | ICD-10-CM | POA: Diagnosis not present

## 2023-05-28 DIAGNOSIS — I5032 Chronic diastolic (congestive) heart failure: Secondary | ICD-10-CM | POA: Diagnosis not present

## 2023-05-28 DIAGNOSIS — R2990 Unspecified symptoms and signs involving the nervous system: Principal | ICD-10-CM

## 2023-05-28 DIAGNOSIS — E162 Hypoglycemia, unspecified: Secondary | ICD-10-CM | POA: Diagnosis not present

## 2023-05-28 DIAGNOSIS — Z8249 Family history of ischemic heart disease and other diseases of the circulatory system: Secondary | ICD-10-CM

## 2023-05-28 DIAGNOSIS — Z87891 Personal history of nicotine dependence: Secondary | ICD-10-CM

## 2023-05-28 DIAGNOSIS — Z79899 Other long term (current) drug therapy: Secondary | ICD-10-CM | POA: Diagnosis not present

## 2023-05-28 DIAGNOSIS — G8321 Monoplegia of upper limb affecting right dominant side: Secondary | ICD-10-CM | POA: Diagnosis not present

## 2023-05-28 DIAGNOSIS — I509 Heart failure, unspecified: Secondary | ICD-10-CM | POA: Diagnosis not present

## 2023-05-28 DIAGNOSIS — I6389 Other cerebral infarction: Secondary | ICD-10-CM | POA: Diagnosis not present

## 2023-05-28 DIAGNOSIS — K219 Gastro-esophageal reflux disease without esophagitis: Secondary | ICD-10-CM | POA: Diagnosis not present

## 2023-05-28 DIAGNOSIS — I4891 Unspecified atrial fibrillation: Secondary | ICD-10-CM | POA: Diagnosis present

## 2023-05-28 DIAGNOSIS — R1310 Dysphagia, unspecified: Secondary | ICD-10-CM | POA: Diagnosis present

## 2023-05-28 DIAGNOSIS — I6782 Cerebral ischemia: Secondary | ICD-10-CM | POA: Diagnosis not present

## 2023-05-28 DIAGNOSIS — R29725 NIHSS score 25: Secondary | ICD-10-CM | POA: Diagnosis not present

## 2023-05-28 DIAGNOSIS — I6521 Occlusion and stenosis of right carotid artery: Secondary | ICD-10-CM | POA: Diagnosis not present

## 2023-05-28 DIAGNOSIS — I63512 Cerebral infarction due to unspecified occlusion or stenosis of left middle cerebral artery: Secondary | ICD-10-CM | POA: Diagnosis not present

## 2023-05-28 DIAGNOSIS — M1909 Primary osteoarthritis, other specified site: Secondary | ICD-10-CM | POA: Diagnosis not present

## 2023-05-28 DIAGNOSIS — J90 Pleural effusion, not elsewhere classified: Secondary | ICD-10-CM | POA: Diagnosis not present

## 2023-05-28 DIAGNOSIS — Z743 Need for continuous supervision: Secondary | ICD-10-CM | POA: Diagnosis not present

## 2023-05-28 DIAGNOSIS — R531 Weakness: Secondary | ICD-10-CM | POA: Diagnosis not present

## 2023-05-28 DIAGNOSIS — Z853 Personal history of malignant neoplasm of breast: Secondary | ICD-10-CM

## 2023-05-28 LAB — CBC
HCT: 38.6 % (ref 36.0–46.0)
Hemoglobin: 12.4 g/dL (ref 12.0–15.0)
MCH: 32.9 pg (ref 26.0–34.0)
MCHC: 32.1 g/dL (ref 30.0–36.0)
MCV: 102.4 fL — ABNORMAL HIGH (ref 80.0–100.0)
Platelets: 205 10*3/uL (ref 150–400)
RBC: 3.77 MIL/uL — ABNORMAL LOW (ref 3.87–5.11)
RDW: 14.7 % (ref 11.5–15.5)
WBC: 10.8 10*3/uL — ABNORMAL HIGH (ref 4.0–10.5)
nRBC: 0 % (ref 0.0–0.2)

## 2023-05-28 LAB — COMPREHENSIVE METABOLIC PANEL
ALT: 15 U/L (ref 0–44)
AST: 25 U/L (ref 15–41)
Albumin: 3.2 g/dL — ABNORMAL LOW (ref 3.5–5.0)
Alkaline Phosphatase: 87 U/L (ref 38–126)
Anion gap: 8 (ref 5–15)
BUN: 44 mg/dL — ABNORMAL HIGH (ref 8–23)
CO2: 26 mmol/L (ref 22–32)
Calcium: 9.5 mg/dL (ref 8.9–10.3)
Chloride: 104 mmol/L (ref 98–111)
Creatinine, Ser: 2.35 mg/dL — ABNORMAL HIGH (ref 0.44–1.00)
GFR, Estimated: 18 mL/min — ABNORMAL LOW (ref >60)
Glucose, Bld: 87 mg/dL (ref 70–99)
Potassium: 4.3 mmol/L (ref 3.5–5.1)
Sodium: 138 mmol/L (ref 135–145)
Total Bilirubin: 0.9 mg/dL (ref <1.2)
Total Protein: 6.6 g/dL (ref 6.5–8.1)

## 2023-05-28 LAB — GLUCOSE, CAPILLARY
Glucose-Capillary: 105 mg/dL — ABNORMAL HIGH (ref 70–99)
Glucose-Capillary: 118 mg/dL — ABNORMAL HIGH (ref 70–99)

## 2023-05-28 LAB — RAPID URINE DRUG SCREEN, HOSP PERFORMED
Amphetamines: NOT DETECTED
Barbiturates: NOT DETECTED
Benzodiazepines: NOT DETECTED
Cocaine: NOT DETECTED
Opiates: NOT DETECTED
Tetrahydrocannabinol: NOT DETECTED

## 2023-05-28 LAB — I-STAT CHEM 8, ED
BUN: 43 mg/dL — ABNORMAL HIGH (ref 8–23)
Calcium, Ion: 1.14 mmol/L — ABNORMAL LOW (ref 1.15–1.40)
Chloride: 104 mmol/L (ref 98–111)
Creatinine, Ser: 2.5 mg/dL — ABNORMAL HIGH (ref 0.44–1.00)
Glucose, Bld: 83 mg/dL (ref 70–99)
HCT: 37 % (ref 36.0–46.0)
Hemoglobin: 12.6 g/dL (ref 12.0–15.0)
Potassium: 4.2 mmol/L (ref 3.5–5.1)
Sodium: 139 mmol/L (ref 135–145)
TCO2: 26 mmol/L (ref 22–32)

## 2023-05-28 LAB — HEMOGLOBIN A1C
Hgb A1c MFr Bld: 5.2 % (ref 4.8–5.6)
Mean Plasma Glucose: 102.54 mg/dL

## 2023-05-28 LAB — DIFFERENTIAL
Abs Immature Granulocytes: 0.05 10*3/uL (ref 0.00–0.07)
Basophils Absolute: 0 10*3/uL (ref 0.0–0.1)
Basophils Relative: 0 %
Eosinophils Absolute: 0.1 10*3/uL (ref 0.0–0.5)
Eosinophils Relative: 1 %
Immature Granulocytes: 1 %
Lymphocytes Relative: 30 %
Lymphs Abs: 3.3 10*3/uL (ref 0.7–4.0)
Monocytes Absolute: 1.2 10*3/uL — ABNORMAL HIGH (ref 0.1–1.0)
Monocytes Relative: 12 %
Neutro Abs: 6.1 10*3/uL (ref 1.7–7.7)
Neutrophils Relative %: 56 %

## 2023-05-28 LAB — CBG MONITORING, ED
Glucose-Capillary: 196 mg/dL — ABNORMAL HIGH (ref 70–99)
Glucose-Capillary: 61 mg/dL — ABNORMAL LOW (ref 70–99)
Glucose-Capillary: 104 mg/dL — ABNORMAL HIGH (ref 70–99)

## 2023-05-28 LAB — PROTIME-INR
INR: 1 (ref 0.8–1.2)
Prothrombin Time: 13.5 s (ref 11.4–15.2)

## 2023-05-28 LAB — URINALYSIS, ROUTINE W REFLEX MICROSCOPIC
Bilirubin Urine: NEGATIVE
Glucose, UA: NEGATIVE mg/dL
Hgb urine dipstick: NEGATIVE
Ketones, ur: NEGATIVE mg/dL
Leukocytes,Ua: NEGATIVE
Nitrite: NEGATIVE
Protein, ur: NEGATIVE mg/dL
Specific Gravity, Urine: 1.015 (ref 1.005–1.030)
pH: 6 (ref 5.0–8.0)

## 2023-05-28 LAB — RESP PANEL BY RT-PCR (RSV, FLU A&B, COVID)  RVPGX2
Influenza A by PCR: NEGATIVE
Influenza B by PCR: NEGATIVE
Resp Syncytial Virus by PCR: NEGATIVE
SARS Coronavirus 2 by RT PCR: NEGATIVE

## 2023-05-28 LAB — ETHANOL: Alcohol, Ethyl (B): <10 mg/dL (ref <10)

## 2023-05-28 LAB — ECHOCARDIOGRAM COMPLETE
Area-P 1/2: 4.93 cm2
Height: 63 in
S' Lateral: 2.2 cm
Weight: 2589.08 [oz_av]

## 2023-05-28 LAB — APTT: aPTT: 26 s (ref 24–36)

## 2023-05-28 MED ORDER — ACETAMINOPHEN 160 MG/5ML PO SOLN: 650.0000 mg | ORAL | Status: DC | PRN

## 2023-05-28 MED ORDER — SENNOSIDES-DOCUSATE SODIUM 8.6-50 MG PO TABS: 1.0000 | ORAL_TABLET | Freq: Every evening | ORAL | Status: DC | PRN

## 2023-05-28 MED ORDER — METOPROLOL TARTRATE 5 MG/5ML IV SOLN
5.0000 mg | Freq: Once | INTRAVENOUS | Status: AC
  Administered 2023-05-29: 5 mg via INTRAVENOUS
  Filled 2023-05-28: qty 5

## 2023-05-28 MED ORDER — ENOXAPARIN SODIUM 30 MG/0.3ML IJ SOSY
30.0000 mg | PREFILLED_SYRINGE | INTRAMUSCULAR | Status: DC
  Administered 2023-05-28: 30 mg via SUBCUTANEOUS
  Filled 2023-05-28 (×2): qty 0.3

## 2023-05-28 MED ORDER — GADOBUTROL 1 MMOL/ML IV SOLN
7.0000 mL | Freq: Once | INTRAVENOUS | Status: AC | PRN
  Administered 2023-05-28: 7 mL via INTRAVENOUS

## 2023-05-28 MED ORDER — SODIUM CHLORIDE 0.9 % IV SOLN: INTRAVENOUS | Status: DC

## 2023-05-28 MED ORDER — ACETAMINOPHEN 650 MG RE SUPP
650.0000 mg | RECTAL | Status: DC | PRN
  Administered 2023-05-29: 650 mg via RECTAL
  Filled 2023-05-28: qty 1

## 2023-05-28 MED ORDER — DEXTROSE-SODIUM CHLORIDE 5-0.9 % IV SOLN: INTRAVENOUS | Status: DC

## 2023-05-28 MED ORDER — INSULIN ASPART 100 UNIT/ML IJ SOLN
0.0000 [IU] | INTRAMUSCULAR | Status: DC
  Administered 2023-05-29: 1 [IU] via SUBCUTANEOUS

## 2023-05-28 MED ORDER — ASPIRIN 81 MG PO TBEC: 81.0000 mg | DELAYED_RELEASE_TABLET | Freq: Every day | ORAL | Status: DC

## 2023-05-28 MED ORDER — METOPROLOL TARTRATE 5 MG/5ML IV SOLN
5.0000 mg | Freq: Once | INTRAVENOUS | Status: AC
  Administered 2023-05-28: 5 mg via INTRAVENOUS
  Filled 2023-05-28: qty 5

## 2023-05-28 MED ORDER — STROKE: EARLY STAGES OF RECOVERY BOOK: Freq: Once | Status: DC

## 2023-05-28 MED ORDER — METOPROLOL TARTRATE 5 MG/5ML IV SOLN
5.0000 mg | Freq: Four times a day (QID) | INTRAVENOUS | Status: DC | PRN
  Administered 2023-05-28: 5 mg via INTRAVENOUS
  Filled 2023-05-28: qty 5

## 2023-05-28 MED ORDER — DILTIAZEM HCL-DEXTROSE 125-5 MG/125ML-% IV SOLN (PREMIX): 5.0000 mg/h | INTRAVENOUS | Status: DC

## 2023-05-28 MED ORDER — DEXTROSE 50 % IV SOLN
1.0000 | Freq: Once | INTRAVENOUS | Status: AC
  Administered 2023-05-28: 50 mL via INTRAVENOUS

## 2023-05-28 MED ORDER — DEXTROSE-SODIUM CHLORIDE 5-0.45 % IV SOLN
INTRAVENOUS | Status: DC
Start: 2023-05-28 — End: 2023-05-29

## 2023-05-28 MED ORDER — ACETAMINOPHEN 325 MG PO TABS: 650.0000 mg | ORAL_TABLET | ORAL | Status: DC | PRN

## 2023-05-28 NOTE — Plan of Care (Addendum)
The patient received gadolinium contrast in the setting of CKD. Gadolinium generally should not be administered in patients with a creatinine clearance of less than 30. Estimated GFR in this patient is 18. May need expedited dialysis to remove gadolinium to avoid potential complication of nephrogenic systemic fibrosis. I have notified the Hospitalist on call and Dr. Thedore Mins of Nephrology is being contacted to weigh in on dialysis.   The stroke on MRI appears large enough to present a significant for acute hemorrhagic conversion if IV heparin is started. Will need to hold off on anticoagulation for now.   Electronically signed: Dr. Caryl Pina

## 2023-05-28 NOTE — Progress Notes (Signed)
SLP Cancellation Note  Patient Details Name: Cassandra Ray MRN: [id number] DOB: 08-07-1922   Cancelled treatment:       Reason Eval/Treat Not Completed: Patient at procedure or test/unavailable. Pt off the unit at MRI. Discussed with RN and MD. SLP will f/u as able, likely next date.   Gwynneth Aliment, M.A., CF-SLP Speech Language Pathology, Acute Rehabilitation Services  Secure Chat preferred [phone]  05/28/2023, 4:48 PM

## 2023-05-28 NOTE — Consult Note (Addendum)
NEUROLOGY CONSULT NOTE   Date of service: May 28, 2023 Patient Name: Cassandra Ray MRN:  [id number] DOB:  Apr 20, 1923 Chief Complaint: New onset of right sided weakness Requesting Provider: Virgina Norfolk, DO  History of Present Illness  Cassandra Ray is a 89 y.o. female who has a past medical history of Actinic keratoses, Arthritis, Atrial fibrillation with RVR (HCC), Breast cancer (HCC), Chronic diastolic CHF (congestive heart failure) (HCC), CKD (chronic kidney disease), stage III (HCC), Compression fracture of L3 lumbar vertebra, Dysphagia, GERD (gastroesophageal reflux disease), Hiatal hernia (11/27/2014), HTN (hypertension), Hypothyroidism, Insomnia, Osteopenia, PAF (paroxysmal atrial fibrillation) (HCC), PAT (paroxysmal atrial tachycardia) (HCC), and Pleural effusion., who presents to the ED with new onset of right arm drift and aphasia. LKN 12 PM on Sunday. At 0600 staff at her SNF noted the above. Speech was at her baseline yesterday. BP per EMS: 160/palp. On arrival to the ED CBG was 61. D50 administered in CT.  Not on ASA, Plavix or an anticoagulant at her SNF. Eliquis and Cardizem are on her list of medication allergies.    LKW: 1200 noon yesterday Modified rankin score: 5-Severe disability-bedridden, incontinent, needs constant attention IV Thrombolysis:  No: Out of the TNK time window EVT: No: Modified rankin score of 5 NIHSS Score: 25    ROS  Unable to ascertain due to AMS.   Past History   Past Medical History:  Diagnosis Date   Actinic keratoses    Arthritis    back    Atrial fibrillation with RVR (HCC)    Breast cancer (HCC)    Breast cancer (right)   Chronic diastolic CHF (congestive heart failure) (HCC)    CKD (chronic kidney disease), stage III (HCC)    Compression fracture of L3 lumbar vertebra    Dysphagia    GERD (gastroesophageal reflux disease)    takes otc Pepcid as needed   Hiatal hernia 11/27/2014   HTN (hypertension)    Hypothyroidism     Insomnia    Osteopenia    PAF (paroxysmal atrial fibrillation) (HCC)    a. questionable episode in 2015 in [place]. b. event monitor through December/January 2014/2015 which showed no evidence of atrial fibrillation. c. Dx 12/2018.   PAT (paroxysmal atrial tachycardia) (HCC)    Pleural effusion     Past Surgical History:  Procedure Laterality Date   BREAST LUMPECTOMY Right 1995   lumpectomy   COLONOSCOPY     ESOPHAGOGASTRODUODENOSCOPY (EGD) WITH PROPOFOL Left 01/09/2019   Procedure: ESOPHAGOGASTRODUODENOSCOPY (EGD) WITH PROPOFOL;  Surgeon: Jeani Hawking, MD;  Location: [facility];  Service: Endoscopy;  Laterality: Left;   EYE SURGERY Bilateral    cataract w/ lens implant   KYPHOPLASTY N/A 09/18/2013   Procedure: KYPHOPLASTY;  Surgeon: Emilee Hero, MD;  Location: [facility] OR;  Service: Orthopedics;  Laterality: N/A;  Lumbar 3 kyphoplasty   TONSILLECTOMY      Family History: Family History  Problem Relation Age of Onset   CAD Mother    Diabetes Mellitus II Mother    Heart failure Mother    Heart failure Father    Prostate cancer Brother    Kidney cancer Brother    Heart disease Sister    Esophageal cancer Neg Hx    Colon cancer Neg Hx     Social History  reports that she has quit smoking. She has never used smokeless tobacco. She reports current alcohol use of about 7.0 standard drinks of alcohol per week. She reports that she does not use drugs.  Allergies  Allergen Reactions   Cardizem [Diltiazem]    Eliquis [Apixaban]    Tape Other (See Comments)    Patient prefers easy-release tape    Medications   Current Facility-Administered Medications:    bisacodyl (DULCOLAX) suppository 10 mg, 10 mg, Rectal, Daily PRN, Fletcher Anon, NP  Current Outpatient Medications:    acetaminophen (TYLENOL) 500 MG tablet, Take 2 tablets (1,000 mg total) by mouth every 8 (eight) hours as needed., Disp: , Rfl:    acetaminophen (TYLENOL) 500 MG tablet, Take 1,000 mg by mouth in the  morning and at bedtime., Disp: , Rfl:    arformoterol (BROVANA) 15 MCG/2ML NEBU, Take 2 mLs (15 mcg total) by nebulization in the morning and at bedtime., Disp: 120 mL, Rfl: 0   B Complex-C (B-COMPLEX WITH VITAMIN C) tablet, Take 1 tablet by mouth daily., Disp: , Rfl:    benzonatate (TESSALON) 100 MG capsule, Take 100 mg by mouth 3 (three) times daily as needed for cough., Disp: , Rfl:    budesonide (PULMICORT) 0.5 MG/2ML nebulizer solution, Take 2 mLs (0.5 mg total) by nebulization in the morning and at bedtime., Disp: 30 mL, Rfl: 0   Cholecalciferol (VITAMIN D3) 2000 units TABS, Take 2,000 Units by mouth daily. , Disp: , Rfl:    docusate sodium (COLACE) 100 MG capsule, Take 100 mg by mouth daily. (Patient not taking: Reported on 05/23/2023), Disp: , Rfl:    Ensure (ENSURE), Take 237 mLs by mouth daily as needed., Disp: , Rfl:    furosemide (LASIX) 40 MG tablet, Take 1 tablet (40 mg total) by mouth daily., Disp: 30 tablet, Rfl: 3   guaiFENesin (MUCINEX) 600 MG 12 hr tablet, Take 600 mg by mouth in the morning., Disp: , Rfl:    guaiFENesin (MUCINEX) 600 MG 12 hr tablet, Take 600 mg by mouth daily as needed for cough., Disp: , Rfl:    ipratropium-albuterol (DUONEB) 0.5-2.5 (3) MG/3ML SOLN, Take 3 mLs by nebulization every 6 (six) hours as needed (Wheezing/SOB)., Disp: , Rfl:    levothyroxine (SYNTHROID) 112 MCG tablet, Take 112 mcg by mouth every morning. (Patient not taking: Reported on 05/23/2023), Disp: , Rfl:    melatonin 5 MG TABS, Take 5 mg by mouth at bedtime., Disp: , Rfl:    metoprolol tartrate (LOPRESSOR) 25 MG tablet, Take 0.5 tablets (12.5 mg total) by mouth 2 (two) times daily., Disp: , Rfl:    nitroGLYCERIN (NITROSTAT) 0.4 MG SL tablet, Place 1 tablet (0.4 mg total) under the tongue every 5 (five) minutes as needed for chest pain., Disp: 25 tablet, Rfl: 1   ondansetron (ZOFRAN) 4 MG tablet, Take 4 mg by mouth every 6 (six) hours as needed for nausea or vomiting., Disp: , Rfl:     pantoprazole (PROTONIX) 40 MG tablet, Take 1 tablet (40 mg total) by mouth daily., Disp: , Rfl:    polyethylene glycol powder (GLYCOLAX/MIRALAX) 17 GM/SCOOP powder, Take 17 g by mouth daily as needed., Disp: , Rfl:    potassium chloride (KLOR-CON M) 10 MEQ tablet, Take 1 tablet (10 mEq total) by mouth daily., Disp: 30 tablet, Rfl: 0   predniSONE (DELTASONE) 20 MG tablet, Take 1 tablet (20 mg total) by mouth daily with breakfast for 5 days., Disp: , Rfl:    Probiotic Product (ALIGN) 4 MG CAPS, Take 4 mg by mouth daily., Disp: , Rfl:    sennosides-docusate sodium (SENOKOT-S) 8.6-50 MG tablet, Take 1 tablet by mouth at bedtime as needed for constipation., Disp: 30  tablet, Rfl: 0  Vitals   Vitals:   Jun 12, 2023 1000 2023-06-12 1005 06-12-2023 1010  BP:  (!) 191/123   Pulse:  (!) 120   Resp:  15   Temp:   97.8 F (36.6 C)  TempSrc:   Oral  SpO2:  96%   Weight: 73.4 kg      Body mass index is 27.95 kg/m.  Physical Exam   Physical Exam  HEENT:  [place]/AT. No nuchal rigidity Lungs: Respirations unlabored Extremities: Warm and well perfused. No edema. Soft booties to heels are noted bilaterally.   Neurological Examination Mental Status: Awake with decreased level of alertness. Nonverbal and not following any commands. Purposeful movements with LUE but only with posturing of RUE. Will react to pain more briskly on left than right. Does not attend to the left.   Cranial Nerves: [id number] to threat on left but not on right.    III,IV, VI: No ptosis. Eyes are deviated to the left. Can track rightwards to the midline but does not cross and also unable to overcome with oculocephalic maneuver.  V: Decreased reactivity to right sided stimuli VII: Right facial droop  VIII: Unable to assess IX,X: Gag reflex deferred XI: Head is turned to the left during most of the exam, occasionally rotates to midline but only slightly crosses to the right.  XII: Does not follow command for tongue  protrusion Motor/Sensory: RUE with no spontaneous or antigravity movement with noxious. Extensor posturing and pained affect in response to pinch.  LUE moves antigravity purposefully in response to noxious. Does not follow commands for testing against resistance.  RLE with brisk withdrawal to pinch LLE with withdrawal to pinch, but less briskly than on right.  Deep Tendon Reflexes: 2+ left biceps and brachioradialis 1+ right biceps and brachioradialis 0 bilateral patellae Cerebellar: Unable to assess as she is not following commands Gait: Unable to assess  Labs/Imaging/Neurodiagnostic studies   CBC:  Recent Labs  Lab June 12, 2023 1003 06/12/23 1005  WBC  --  10.8*  NEUTROABS  --  6.1  HGB 12.6 12.4  HCT 37.0 38.6  MCV  --  102.4*  PLT  --  205   Basic Metabolic Panel:  Lab Results  Component Value Date   NA 139 June 12, 2023   K 4.2 June 12, 2023   CO2 24 (A) 02/27/2023   GLUCOSE 83 06-12-23   BUN 43 (H) 12-Jun-2023   CREATININE 2.50 (H) 06/12/2023   CALCIUM 8.8 02/27/2023   GFRNONAA 39 (L) 09/07/2022   GFRAA 31 (L) 01/10/2019   Lipid Panel:  Lab Results  Component Value Date   LDLCALC 36 12/13/2018   HgbA1c: No results found for: "HGBA1C" Urine Drug Screen: No results found for: "LABOPIA", "COCAINSCRNUR", "LABBENZ", "AMPHETMU", "THCU", "LABBARB"  Alcohol Level     Component Value Date/Time   ETH <10 2023-06-12 1005   INR  Lab Results  Component Value Date   INR 1.2 09/02/2022   APTT  Lab Results  Component Value Date   APTT 30 09/17/2013     ASSESSMENT  89 year old female SNF resident with a PMHx as listed in the HPI including atrial fibrillation with RVR, breast cancer, CHF, CKD stage III and HTN, who presents with acute onset of aphasia and right arm drift. Code Stroke was called in the field by EMS. LKN was 1200 noon yesterday.  - Exam reveals findings most consistent with a left MCA territory lesion, most likely an acute stroke.  - CT head:  No evidence  of acute intracranial abnormality. ASPECTS is 10. - Stroke risk factors as above.  - Not a TNK candidate due to time criteria.  - Not a candidate for thrombectomy due to mRS of 5. - Elevated BUN and Cr with eGFR of 18      RECOMMENDATIONS  - Frequent neuro checks. - HgbA1c, fasting lipid panel - MRI/MRA of the brain without contrast - PT consult, OT consult, Speech consult - Echocardiogram - Carotid ultrasound - Most likely risks of statin outweigh potential long term benefits given her advanced age  - Decision on anticoagulation: Risk for acute embolic stroke recurrence in atrial fibrillation with RVR is significant, justifying starting her on heparin with no bolus at this time. Overall risk of morbidity/mortality from recurrent stroke off anticoagulation is felt to be higher than the risk of morbidity/mortality from hemorrhagic conversion on anticoagulation. - Regarding long term anticoagulation, of note, she has a listed allergy to Eliquis, possibly also with cross reactivity to Xarelto. Therefore warfarin or therapeutic long-term Lovenox may be the best options for this patient. .  - Risk factor modification - Telemetry monitoring - Management of a-fib with RVR per primary team. Has listed allergy to Cardizem. Beta blocker is indicated.  - Frequent neuro checks - NPO until passes stroke swallow screen - Modified permissive HTN protocol given advanced age. Treat SBP if > 180.  - Stroke Team to follow in the AM.    ______________________________________________________________________    Dessa Phi, Jobe Mutch, MD [facility]

## 2023-05-28 NOTE — H&P (Signed)
History and Physical    Patient: Cassandra Ray YNW:[id number] DOB: 1922-11-07 DOA: 05/28/2023 DOS: the patient was seen and examined on 05/28/2023 PCP: Mahlon Gammon, MD  Patient coming from: SNF [facility])  Chief Complaint:  Chief Complaint  Patient presents with   Code Stroke   HPI: Cassandra Ray is a 89 y.o. female with medical history significant of chronic diastolic HF, HTN, CKD 3B, GERD, hypothyroidism, insomnia, paroxysmal A-fib and dysphagia presented via EMS from SNF for evaluation of right-sided weakness. Per son, patient currently resides at [facility] and receives 24-hour care.  She was slightly wheezy yesterday but was alert and responding to them. This morning, patient was found unresponsive by staff so EMS were called. On arrival, patient was found to have right-sided weakness as well as left gaze preference. Code stroke was called on arrival to the ED. On evaluation, patient was found to be disoriented, aphasic with weakness on her right side as well as left gaze preference.   ED course: Initial vitals showed temp 97.8, RR 15, HR 115, BP 160/113, SpO2 99% on room air. Labs show CBG 61-->196, sodium 139, K+ 4.2, BUN/creatinine 43/2.5, WBC 10.8, Hgb 12.4, platelet 205, normal ethanol levels, PT/INR 13.5/1.0, negative COVID flu and RSV test, A1c 5.2%. EKG shows A-fib with HR 123 CXR without any acute cardiopulmonary disease CT head with no acute intracranial abnormalities MRI brain showed acute cortical/subcortical left MCA territory infarction and a small acute cortical infarct in the mid left frontal lobe.  Patient received 1 amp of D50 for hypoglycemia with improvement to 196. Neurology recommended medicine admit TR[facility] was consulted for admission  Review of Systems: As mentioned in the history of present illness. All other systems reviewed and are negative. Past Medical History:  Diagnosis Date   Actinic keratoses    Arthritis    back    Atrial fibrillation  with RVR (HCC)    Breast cancer (HCC)    Breast cancer (right)   Chronic diastolic CHF (congestive heart failure) (HCC)    CKD (chronic kidney disease), stage III (HCC)    Compression fracture of L3 lumbar vertebra    Dysphagia    GERD (gastroesophageal reflux disease)    takes otc Pepcid as needed   Hiatal hernia 11/27/2014   HTN (hypertension)    Hypothyroidism    Insomnia    Osteopenia    PAF (paroxysmal atrial fibrillation) (HCC)    a. questionable episode in 2015 in [place]. b. event monitor through December/January 2014/2015 which showed no evidence of atrial fibrillation. c. Dx 12/2018.   PAT (paroxysmal atrial tachycardia) (HCC)    Pleural effusion    Past Surgical History:  Procedure Laterality Date   BREAST LUMPECTOMY Right 1995   lumpectomy   COLONOSCOPY     ESOPHAGOGASTRODUODENOSCOPY (EGD) WITH PROPOFOL Left 01/09/2019   Procedure: ESOPHAGOGASTRODUODENOSCOPY (EGD) WITH PROPOFOL;  Surgeon: Jeani Hawking, MD;  Location: [facility] END[facility]COPY;  Service: Endoscopy;  Laterality: Left;   EYE SURGERY Bilateral    cataract w/ lens implant   KYPHOPLASTY N/A 09/18/2013   Procedure: KYPHOPLASTY;  Surgeon: Emilee Hero, MD;  Location: [facility] OR;  Service: Orthopedics;  Laterality: N/A;  Lumbar 3 kyphoplasty   TONSILLECTOMY     Social History:  reports that she has quit smoking. She has never used smokeless tobacco. She reports current alcohol use of about 7.0 standard drinks of alcohol per week. She reports that she does not use drugs.  Allergies  Allergen Reactions   Cardizem [Diltiazem]  Eliquis [Apixaban]    Tape Other (See Comments)    Patient prefers easy-release tape    Family History  Problem Relation Age of Onset   CAD Mother    Diabetes Mellitus II Mother    Heart failure Mother    Heart failure Father    Prostate cancer Brother    Kidney cancer Brother    Heart disease Sister    Esophageal cancer Neg Hx    Colon cancer Neg Hx     Prior to Admission medications    Medication Sig Start Date End Date Taking? Authorizing Provider  acetaminophen (TYLENOL) 500 MG tablet Take 1,000 mg by mouth See admin instructions. Take 2 tablets (1000mg ) by mouth twice daily. May give an additional 2 tablets every 8 hours as needed for pain.   Yes [provider]  arformoterol (BROVANA) 15 MCG/2ML NEBU Take 2 mLs (15 mcg total) by nebulization in the morning and at bedtime. 10/26/22  Yes Wert, Trula Ore, NP  B Complex-C (B-COMPLEX WITH VITAMIN C) tablet Take 1 tablet by mouth at bedtime.   Yes [provider]  benzonatate (TESSALON) 100 MG capsule Take 100 mg by mouth 3 (three) times daily as needed for cough.   Yes [provider]  budesonide (PULMICORT) 0.5 MG/2ML nebulizer solution Take 2 mLs (0.5 mg total) by nebulization in the morning and at bedtime. 10/26/22  Yes Fletcher Anon, NP  Cholecalciferol (VITAMIN D3) 2000 units TABS Take 2,000 Units by mouth in the morning.   Yes [provider]  docusate sodium (COLACE) 100 MG capsule Take 100 mg by mouth every evening.   Yes [provider]  furosemide (LASIX) 40 MG tablet Take 1 tablet (40 mg total) by mouth daily. Patient taking differently: Take 40 mg by mouth in the morning. 10/26/22  Yes Wert, Trula Ore, NP  guaiFENesin (MUCINEX) 600 MG 12 hr tablet Take 600 mg by mouth See admin instructions. Give 1 tablet (600mg ) by mouth every morning. May take an additional 1 tablet during the day if needed for cough.   Yes [provider]  levothyroxine (SYNTHROID) 100 MCG tablet Take 100 mcg by mouth in the morning.   Yes [provider]  melatonin 5 MG TABS Take 5 mg by mouth at bedtime.   Yes [provider]  metoprolol tartrate (LOPRESSOR) 25 MG tablet Take 0.5 tablets (12.5 mg total) by mouth 2 (two) times daily. 05/01/23  Yes Fargo, [name] E, NP  pantoprazole (PROTONIX) 40 MG tablet Take 1 tablet (40 mg total) by mouth daily. Patient taking differently: Take 40  mg by mouth in the morning. 03/23/23  Yes Wert, Trula Ore, NP  potassium chloride (KLOR-CON M) 10 MEQ tablet Take 1 tablet (10 mEq total) by mouth daily. Patient taking differently: Take 10 mEq by mouth in the morning. 10/26/22  Yes Wert, Trula Ore, NP  predniSONE (DELTASONE) 20 MG tablet Take 1 tablet (20 mg total) by mouth daily with breakfast for 5 days. 05/23/23 05/28/23 Yes Fargo, [name] E, NP  Probiotic Product (ALIGN) 4 MG CAPS Take 4 mg by mouth daily.   Yes [provider]  aluminum-magnesium hydroxide-simethicone (MAALOX) 200-200-20 MG/5ML SUSP Take 30 mLs by mouth every 4 (four) hours as needed (heartburn). Patient not taking: Reported on 05/28/2023    [provider]  carbamide peroxide (DEBROX) 6.5 % OTIC solution 5 drops See admin instructions. Administer 5 drops into affected ear(s) x 3 nights as needed for wax impaction, then irrigate with bulb syringe the following  morning. Patient not taking: Reported on 05/28/2023    [provider]  ipratropium-albuterol (DUONEB) 0.5-2.5 (3) MG/3ML SOLN Take 3 mLs by nebulization every 6 (six) hours as needed (wheezing, shortness of breath). Patient not taking: Reported on 05/28/2023 08/23/22   [provider]  nitroGLYCERIN (NITROSTAT) 0.4 MG SL tablet Place 1 tablet (0.4 mg total) under the tongue every 5 (five) minutes as needed for chest pain. Patient not taking: Reported on 05/28/2023 01/05/20   Jake Bathe, MD  Nutritional Supplements (NUTRITIONAL DRINK) LIQD Take 1 Container by mouth daily as needed (supplement). Ensure/Boost Patient not taking: Reported on 05/28/2023    [provider]  ondansetron (ZOFRAN-ODT) 4 MG disintegrating tablet Take 4 mg by mouth every 6 (six) hours as needed for nausea or vomiting. Patient not taking: Reported on 05/28/2023    [provider]  polyethylene glycol powder (GLYCOLAX/MIRALAX) 17 GM/SCOOP powder Take 17 g by mouth daily as needed. Patient not  taking: Reported on 05/28/2023 01/17/23   Fletcher Anon, NP  sennosides-docusate sodium (SENOKOT-S) 8.6-50 MG tablet Take 1 tablet by mouth at bedtime as needed for constipation. Patient not taking: Reported on 05/28/2023 01/21/21   Fletcher Anon, NP    Physical Exam: Vitals:   05/28/23 1219 05/28/23 1230 05/28/23 1403 05/28/23 1718  BP:  (!) 140/121 (!) 160/108 (!) 175/113  Pulse:  (!) 142 (!) 129 (!) 135  Resp:  17 19 17   Temp: 97.8 F (36.6 C)  (!) 97.3 F (36.3 C) 97.6 F (36.4 C)  TempSrc: Oral  Oral Oral  SpO2:  97% 99% 99%  Weight:   73.4 kg   Height:   5\' 3"  (1.6 m)    General: Acutely ill elderly woman laying in bed. No acute distress. HEENT: /AT. Anicteric sclera. PERRLA. Dry mucous membrane. Blinks to threat but does not follow commands.  CV: Tachycardic.  Irregularly irregular rhythm. No murmurs, rubs, or gallops. No LE edema Pulmonary: Lungs CTAB. Normal effort. No wheezing or rales. Abdominal: Soft, nontender, nondistended. Normal bowel sounds. Extremities: Palpable radial and DP pulses. Normal ROM. Skin: Warm and dry. No obvious rash or lesions. Neuro: Somnolent.  Eyes open spontaneously with left gaze preference, tracks slightly to the midline. Withdraws to painful stimuli but does not follow commands. Aphasic. Unable to hold RUE against gravity. Holds LUE briefly against gravity. Lower extremities withdrawal to pinch, L>R.    Data Reviewed: Labs show CBG 61-->196, sodium 139, K+ 4.2, BUN/creatinine 43/2.5, WBC 10.8, Hgb 12.4, platelet 205, normal ethanol levels, PT/INR 13.5/1.0, negative COVID flu and RSV test, A1c 5.2%. EKG shows A-fib with HR 123 CXR without any acute cardiopulmonary disease CT head with no acute intracranial abnormalities MRI brain showed acute cortical/subcortical left MCA territory infarction and a small acute cortical infarct in the mid left frontal lobe.    Assessment and Plan: Cassandra Ray is a 89 y.o. female with medical  history significant of chronic diastolic HF, HTN, CKD 3B, GERD, hypothyroidism, insomnia, paroxysmal A-fib and dysphagia presented via EMS from SNF for evaluation of right-sided weakness and found to have acute ischemic stroke  # Left MCA infarct # Left frontal lobe infarct Elderly patient currently residing receiving 24/7 care at SNF presented after being found unresponsive. Neuro exam shows severe right-sided weakness, aphasia, left gaze preference. MRI brain shows evidence of acute cortical/subcortical left MCA territory and mid left frontal lobe infarct. Patient remains aphasic and not following any commands. Patient failed bedside swallow screen. Patient will likely need  palliative care consult for goals of care conversation as full recovery is unlikely. -Neurology following, appreciate recs -Follow-up MRA head and MRA neck -IV D5 1/2NS 75 cc/h, CBG checks -Follow-up TTE -Frequent neurochecks -Permissive hypertension up to 220/110 -A1c 5.2%, lipid panel pending -PT/OT/SLP eval  # Paroxysmal A-fib Patient in A-fib on arrival. EKG shows A-fib with heart rate of 123.  Heart rate remains persistently elevated in the 120s. Patient currently unable to swallow pills. -Telemetry -Follow-up TSH, mag and Phos -Continue IV fluid -Consider IV metoprolol if HR remains distantly above 130  # Diastolic HF -Patient dry on exam. -Follow-up repeat TTE -Pending SLP eval, resume meds if passed  # CKD 3B Unclear baseline creatinine however kidney function has been progressively worsening over the last 8 months. -Continue IV fluid hydration -Avoid nephrotoxic's agents -Trend renal function  # HTN BP elevated with SBP in the 140s to 170s -Permissive hypertension  # Dysphagia -Pending SLP eval    Advance Care Planning:   Code Status: Limited: Do not attempt resuscitation (DNR) -DNR-LIMITED -Do Not Intubate/DNI    Consults: Neurology  Family Communication: Discussed admission with son at  bedside  Severity of Illness: The appropriate patient status for this patient is INPATIENT. Inpatient status is judged to be reasonable and necessary in order to provide the required intensity of service to ensure the patient's safety. The patient's presenting symptoms, physical exam findings, and initial radiographic and laboratory data in the context of their chronic comorbidities is felt to place them at high risk for further clinical deterioration. Furthermore, it is not anticipated that the patient will be medically stable for discharge from the hospital within 2 midnights of admission.   * I certify that at the point of admission it is my clinical judgment that the patient will require inpatient hospital care spanning beyond 2 midnights from the point of admission due to high intensity of service, high risk for further deterioration and high frequency of surveillance required.*  Author: Steffanie Rainwater, MD 05/28/2023 6:13 PM  For on call review [email or web address].

## 2023-05-28 NOTE — ED Notes (Signed)
ED TO INPATIENT HANDOFF REPORT  ED Nurse Name and Phone #: Theophilus Bones [phone]  S Name/Age/Gender Cassandra Ray 89 y.o. female Room/Bed: 039C/039C  Code Status   Code Status: Prior  Home/SNF/Other Nursing Home Patient oriented to: self Is this baseline?  Deferred, pt unable to answer questions  Triage Complete: Triage complete  Chief Complaint Code STROKE  Triage Note Last known well 12/22 12pm. Left arm drift code stroke called from [facility].   Allergies Allergies  Allergen Reactions   Cardizem [Diltiazem]    Eliquis [Apixaban]    Tape Other (See Comments)    Patient prefers easy-release tape    Level of Care/Admitting Diagnosis ED Disposition     ED Disposition  Admit   Condition  --   Comment  The patient appears reasonably stabilized for admission considering the current resources, flow, and capabilities available in the ED at this time, and I doubt any other [facility] requiring further screening and/or treatment in the ED prior to admission is  present.          B Medical/Surgery History Past Medical History:  Diagnosis Date   Actinic keratoses    Arthritis    back    Atrial fibrillation with RVR (HCC)    Breast cancer (HCC)    Breast cancer (right)   Chronic diastolic CHF (congestive heart failure) (HCC)    CKD (chronic kidney disease), stage III (HCC)    Compression fracture of L3 lumbar vertebra    Dysphagia    GERD (gastroesophageal reflux disease)    takes otc Pepcid as needed   Hiatal hernia 11/27/2014   HTN (hypertension)    Hypothyroidism    Insomnia    Osteopenia    PAF (paroxysmal atrial fibrillation) (HCC)    a. questionable episode in 2015 in [place]. b. event monitor through December/January 2014/2015 which showed no evidence of atrial fibrillation. c. Dx 12/2018.   PAT (paroxysmal atrial tachycardia) (HCC)    Pleural effusion    Past Surgical History:  Procedure Laterality Date   BREAST LUMPECTOMY Right 1995   lumpectomy    COLONOSCOPY     ESOPHAGOGASTRODUODENOSCOPY (EGD) WITH PROPOFOL Left 01/09/2019   Procedure: ESOPHAGOGASTRODUODENOSCOPY (EGD) WITH PROPOFOL;  Surgeon: Jeani Hawking, MD;  Location: [facility]COPY;  Service: Endoscopy;  Laterality: Left;   EYE SURGERY Bilateral    cataract w/ lens implant   KYPHOPLASTY N/A 09/18/2013   Procedure: KYPHOPLASTY;  Surgeon: Emilee Hero, MD;  Location: [facility] OR;  Service: Orthopedics;  Laterality: N/A;  Lumbar 3 kyphoplasty   TONSILLECTOMY       A IV Location/Drains/Wounds Patient Lines/Drains/Airways Status     Active Line/Drains/Airways     Name Placement date Placement time Site Days   Peripheral IV 09/05/22 22 G 1.75" Anterior;Left Forearm 09/05/22  1116  Forearm  265   Peripheral IV 05/28/23 18 G Left Antecubital 05/28/23  1007  Antecubital  less than 1   External Urinary Catheter 09/02/22  0200  --  268            Intake/Output Last 24 hours No intake or output data in the 24 hours ending 05/28/23 1255  Labs/Imaging Results for orders placed or performed during the hospital encounter of 05/28/23 (from the past 48 hours)  CBG monitoring, ED     Status: Abnormal   Collection Time: 05/28/23  9:58 AM  Result Value Ref Range   Glucose-Capillary 61 (L) 70 - 99 mg/dL    Comment: Glucose reference range applies only  to samples taken after fasting for at least 8 hours.   Comment 1 Notify RN    Comment 2 Document in Chart   I-stat chem 8, ed     Status: Abnormal   Collection Time: 05/28/23 10:03 AM  Result Value Ref Range   Sodium 139 135 - 145 mmol/L   Potassium 4.2 3.5 - 5.1 mmol/L   Chloride 104 98 - 111 mmol/L   BUN 43 (H) 8 - 23 mg/dL   Creatinine, Ser 9.62 (H) 0.44 - 1.00 mg/dL   Glucose, Bld 83 70 - 99 mg/dL    Comment: Glucose reference range applies only to samples taken after fasting for at least 8 hours.   Calcium, Ion 1.14 (L) 1.15 - 1.40 mmol/L   TCO2 26 22 - 32 mmol/L   Hemoglobin 12.6 12.0 - 15.0 g/dL   HCT 95.2 84.1 - 32.4 %   Ethanol     Status: None   Collection Time: 05/28/23 10:05 AM  Result Value Ref Range   Alcohol, Ethyl (B) <10 <10 mg/dL    Comment: (NOTE) Lowest detectable limit for serum alcohol is 10 mg/dL.  For medical purposes only. Performed at [facility], [address], [place], [place] [address]   Protime-INR     Status: None   Collection Time: 05/28/23 10:05 AM  Result Value Ref Range   Prothrombin Time 13.5 11.4 - 15.2 seconds   INR 1.0 0.8 - 1.2    Comment: (NOTE) INR goal varies based on device and disease states. Performed at [facility], [address] [place], [place] [address]   APTT     Status: None   Collection Time: 05/28/23 10:05 AM  Result Value Ref Range   aPTT 26 24 - 36 seconds    Comment: Performed at [facility], [address] [place], [place] [address]  CBC     Status: Abnormal   Collection Time: 05/28/23 10:05 AM  Result Value Ref Range   WBC 10.8 (H) 4.0 - 10.5 K/uL   RBC 3.77 (L) 3.87 - 5.11 MIL/uL   Hemoglobin 12.4 12.0 - 15.0 g/dL   HCT 47.4 25.9 - 56.3 %   MCV 102.4 (H) 80.0 - 100.0 fL   MCH 32.9 26.0 - 34.0 pg   MCHC 32.1 30.0 - 36.0 g/dL   RDW 87.5 64.3 - 32.9 %   Platelets 205 150 - 400 K/uL   nRBC 0.0 0.0 - 0.2 %    Comment: Performed at [facility], [address] [place], [place] [address]  Differential     Status: Abnormal   Collection Time: 05/28/23 10:05 AM  Result Value Ref Range   Neutrophils Relative % 56 %   Neutro Abs 6.1 1.7 - 7.7 K/uL   Lymphocytes Relative 30 %   Lymphs Abs 3.3 0.7 - 4.0 K/uL   Monocytes Relative 12 %   Monocytes Absolute 1.2 (H) 0.1 - 1.0 K/uL   Eosinophils Relative 1 %   Eosinophils Absolute 0.1 0.0 - 0.5 K/uL   Basophils Relative 0 %   Basophils Absolute 0.0 0.0 - 0.1 K/uL   Immature Granulocytes 1 %   Abs Immature Granulocytes 0.05 0.00 - 0.07 K/uL    Comment: Performed at [facility], [address] [place], [place] [address]  Comprehensive metabolic panel      Status: Abnormal   Collection Time: 05/28/23 10:05 AM  Result Value Ref Range   Sodium  138 135 - 145 mmol/L   Potassium 4.3 3.5 - 5.1 mmol/L   Chloride 104 98 - 111 mmol/L   CO2 26 22 - 32 mmol/L   Glucose, Bld 87 70 - 99 mg/dL    Comment: Glucose reference range applies only to samples taken after fasting for at least 8 hours.   BUN 44 (H) 8 - 23 mg/dL   Creatinine, Ser 1.61 (H) 0.44 - 1.00 mg/dL   Calcium 9.5 8.9 - 09.6 mg/dL   Total Protein 6.6 6.5 - 8.1 g/dL   Albumin 3.2 (L) 3.5 - 5.0 g/dL   AST 25 15 - 41 U/L   ALT 15 0 - 44 U/L   Alkaline Phosphatase 87 38 - 126 U/L   Total Bilirubin 0.9 <1.2 mg/dL   GFR, Estimated 18 (L) >60 mL/min    Comment: (NOTE) Calculated using the CKD-EPI Creatinine Equation (2021)    Anion gap 8 5 - 15    Comment: Performed at [facility], [address], [place], [place] [address]  CBG monitoring, ED     Status: Abnormal   Collection Time: 05/28/23 10:14 AM  Result Value Ref Range   Glucose-Capillary 196 (H) 70 - 99 mg/dL    Comment: Glucose reference range applies only to samples taken after fasting for at least 8 hours.   Comment 1 Notify RN    Comment 2 Document in Chart   CBG monitoring, ED     Status: Abnormal   Collection Time: 05/28/23 12:12 PM  Result Value Ref Range   Glucose-Capillary 104 (H) 70 - 99 mg/dL    Comment: Glucose reference range applies only to samples taken after fasting for at least 8 hours.   MR BRAIN WO CONTRAST Result Date: 05/28/2023 CLINICAL DATA:  Provided history: Stroke, follow-up. EXAM: MRI HEAD WITHOUT CONTRAST TECHNIQUE: Multiplanar, multiecho pulse sequences of the brain and surrounding structures were obtained without intravenous contrast. COMPARISON:  Non-contrast head CT performed earlier today 05/28/2023. FINDINGS: Brain: Generalized cerebral atrophy, mild for age. Acute cortical/subcortical left MCA territory infarct affecting portions of the mid and posterior left frontal lobe, left insula and  left parietal lobe (spanning 7 cm). Of note, there is involvement of the pre and postcentral gyri. Small acute cortical infarct within the mid left frontal lobe medially (ACA vascular territory). No significant mass effect related to these infarcts. No evidence of hemorrhagic conversion. Background severe patchy and confluent T2 FLAIR hyperintense signal abnormality within the cerebral white matter, nonspecific but compatible with chronic small vessel ischemic disease. Mild chronic small vessel ischemic changes also noted within the pons. Small chronic infarcts within the left cerebellar hemisphere. Partially empty sella turcica. No evidence of an intracranial mass. No extra-axial fluid collection. No midline shift. Vascular: A flow void is poorly delineated within portions of the right internal carotid artery siphon, which may reflect high-grade stenosis or vessel occlusion. Skull and upper cervical spine: No focal worrisome marrow lesion. Sinuses/Orbits: Leftward gaze. Prior bilateral ocular lens replacement. Minimal mucosal thickening within the bilateral ethmoid sinuses. Impressions #1, #2 and #3 called by telephone at the time of interpretation on 05/28/2023 at 12:25 pm to provider Dr. Lockie Mola, who verbally acknowledged these results. IMPRESSION: 1. Acute cortical/subcortical left MCA territory infarct spanning 7 cm, as described. 2. Small acute cortical infarct within the mid left frontal lobe medially (ACA vascular territory). 3. A flow void is poorly delineated within portions of the right internal carotid artery siphon, which could reflect high-grade stenosis or  vessel occlusion. Consider CT or MR angiography for further evaluation. 4. Background severe chronic small vessel ischemic disease. 5. Small chronic infarcts within the left cerebellar hemisphere. 6. Mild generalized cerebral atrophy. Electronically Signed   By: Jackey Loge D.O.   On: 05/28/2023 12:27   DG Chest Portable 1 View Result Date:  05/28/2023 CLINICAL DATA:  Code stroke with aphasia and weakness. EXAM: PORTABLE CHEST 1 VIEW COMPARISON:  09/06/2022 FINDINGS: Stable top-normal heart size. Stable elevation of the right hemidiaphragm. Decrease in congestive heart failure since the prior study with some chronic pulmonary interstitial prominence present. Resolution of bilateral pleural effusions since the prior study with potential mild chronic pleural effusion on the right. No pneumothorax. IMPRESSION: Decrease in congestive heart failure since the prior study with some chronic pulmonary interstitial prominence present. Resolution of bilateral pleural effusions since the prior study with potential mild chronic pleural effusion on the right. Electronically Signed   By: Irish Lack M.D.   On: 05/28/2023 10:54   CT HEAD CODE STROKE WO CONTRAST Result Date: 05/28/2023 CLINICAL DATA:  Code stroke.  Neuro deficit, acute, stroke suspected EXAM: CT HEAD WITHOUT CONTRAST TECHNIQUE: Contiguous axial images were obtained from the base of the skull through the vertex without intravenous contrast. RADIATION DOSE REDUCTION: This exam was performed according to the departmental dose-optimization program which includes automated exposure control, adjustment of the mA and/or kV according to patient size and/or use of iterative reconstruction technique. COMPARISON:  None Available. FINDINGS: Motion limited study.  Within this limitation: Brain: No evidence of acute large vascular territory infarction, hemorrhage, hydrocephalus, extra-axial collection or mass lesion/mass effect. Patchy white matter hypodensities are nonspecific but compatible with chronic microvascular change. Vascular: No hyperdense vessel. Skull: No acute fracture. Sinuses/Orbits: Clear sinuses.  No acute orbital findings. Other: No mastoid effusions. ASPECTS [facility] Stroke Program Early CT Score) Total score (0-10 with 10 being normal): 10. IMPRESSION: 1. No evidence of acute  intracranial abnormality. 2. ASPECTS is 10. Electronically Signed   By: Feliberto Harts M.D.   On: 05/28/2023 10:17    Pending Labs Unresulted Labs (From admission, onward)     Start     Ordered   05/29/23 0500  Lipid panel  (Labs)  Tomorrow morning,   R       Comments: Fasting    05/28/23 1041   05/28/23 1120  Resp panel by RT-PCR (RSV, Flu A&B, Covid) Anterior Nasal Swab  Once,   URGENT        05/28/23 1119   05/28/23 1041  Hemoglobin A1c  (Labs)  Once,   URGENT       Comments: To assess prior glycemic control    05/28/23 1041   05/28/23 1005  Urine rapid drug screen (hosp performed)  Once,   STAT        05/28/23 1005   05/28/23 1005  Urinalysis, Routine w reflex microscopic -Urine, Clean Catch  Once,   URGENT       Question:  Specimen Source  Answer:  Urine, Clean Catch   05/28/23 1005            Vitals/Pain Today's Vitals   05/28/23 1115 05/28/23 1210 05/28/23 1219 05/28/23 1230  BP: (!) 170/117 (!) 168/112  (!) 140/121  Pulse: (!) 116 (!) 121  (!) 142  Resp: 17 17  17   Temp:   97.8 F (36.6 C)   TempSrc:   Oral   SpO2: 98% 98%  97%  Weight:  Isolation Precautions No active isolations  Medications Medications   stroke: early stages of recovery book (has no administration in time range)  dextrose 50 % solution 50 mL (50 mLs Intravenous Given 05/28/23 1001)    Mobility deferred     Focused Assessments Neuro Assessment Handoff:  Swallow screen pass? No    NIH Stroke Scale  Dizziness Present: No Headache Present: No Interval: Other (Comment) (Q2 assessment) Level of Consciousness (1a.)   : Not alert, but arousable by minor stimulation to obey, answer, or respond LOC Questions (1b. )   : Answers neither question correctly LOC Commands (1c. )   : Performs neither task correctly Best Gaze (2. )  : Partial gaze palsy Visual (3. )  : Complete hemianopia Facial Palsy (4. )    : Minor paralysis Motor Arm, Left (5a. )   : No effort against  gravity Motor Arm, Right (5b. ) : No movement Motor Leg, Left (6a. )  : No effort against gravity Motor Leg, Right (6b. ) : Some effort against gravity Limb Ataxia (7. ): Absent Sensory (8. )  : Severe to total sensory loss, patient is not aware of being touched in the face, arm, and leg Best Language (9. )  : Mute, global aphasia Dysarthria (10. ): Severe dysarthria, patient's speech is so slurred as to be unintelligible in the absence of or out of proportion to any dysphasia, or is mute/anarthric Extinction/Inattention (11.)   : Profound hemi-inattention or extinction to more than one modality Complete NIHSS TOTAL: 30 Last date known well: 05/27/23 Last time known well: 1200 Neuro Assessment: Exceptions to [facility] Neuro Checks:   Initial (05/28/23 1010)  Has TPA been given? No If patient is a Neuro Trauma and patient is going to OR before floor call report to 4N Charge nurse: [phone] or [phone]   R Recommendations: See Admitting Provider Note  Report given to:   Additional Notes:

## 2023-05-28 NOTE — ED Triage Notes (Signed)
Last known well 12/22 12pm. Left arm drift code stroke called from [facility].

## 2023-05-28 NOTE — Plan of Care (Addendum)
  Received from the ED on stretcher, accompanied by family. Family oriented to room and surroundings. Had a stroke questions and answer session with son and daughter and paged the Doctor to speak with them as well.   Problem: Ischemic Stroke/TIA Tissue Perfusion: Goal: Complications of ischemic stroke/TIA will be minimized Outcome: Progressing   Problem: Coping: Goal: Will verbalize positive feelings about self Outcome: Progressing   Problem: Nutrition: Goal: Dietary intake will improve Outcome: Progressing   Problem: Clinical Measurements: Goal: Cardiovascular complication will be avoided Outcome: Progressing   Problem: Activity: Goal: Risk for activity intolerance will decrease Outcome: Progressing   Problem: Coping: Goal: Level of anxiety will decrease Outcome: Progressing   Problem: Safety: Goal: Ability to remain free from injury will improve Outcome: Progressing   Problem: Skin Integrity: Goal: Risk for impaired skin integrity will decrease Outcome: Progressing

## 2023-05-28 NOTE — ED Provider Notes (Signed)
EMERGENCY DEPARTMENT AT [facility] Provider Note   CSN: [id number] Arrival date & time: 05/28/23  4098  An emergency department physician performed an initial assessment on this suspected stroke patient at 0957.  History  Chief Complaint  Patient presents with   Code Stroke    AUTREY MUSHTAQ is a 89 y.o. female.  Patient arrives as a code stroke.  Level 5 caveat due to confusion.  She appears to have left-sided gaze with right-sided weakness with EMS.  Last known normal yesterday at 12 PM.  Code stroke called due to concern for LVO.  History of hypertension paroxysmal atrial tachycardia A-fib, CKD.  Does not appear to be on blood thinners.  Otherwise history is limited.  The history is provided by the EMS personnel and a caregiver.       Home Medications Prior to Admission medications   Medication Sig Start Date End Date Taking? Authorizing Provider  acetaminophen (TYLENOL) 500 MG tablet Take 2 tablets (1,000 mg total) by mouth every 8 (eight) hours as needed. 01/17/23   Fletcher Anon, NP  acetaminophen (TYLENOL) 500 MG tablet Take 1,000 mg by mouth in the morning and at bedtime.    [provider]  arformoterol (BROVANA) 15 MCG/2ML NEBU Take 2 mLs (15 mcg total) by nebulization in the morning and at bedtime. 10/26/22   Fletcher Anon, NP  B Complex-C (B-COMPLEX WITH VITAMIN C) tablet Take 1 tablet by mouth daily.    [provider]  benzonatate (TESSALON) 100 MG capsule Take 100 mg by mouth 3 (three) times daily as needed for cough.    [provider]  budesonide (PULMICORT) 0.5 MG/2ML nebulizer solution Take 2 mLs (0.5 mg total) by nebulization in the morning and at bedtime. 10/26/22   Fletcher Anon, NP  Cholecalciferol (VITAMIN D3) 2000 units TABS Take 2,000 Units by mouth daily.     [provider]  docusate sodium (COLACE) 100 MG capsule Take 100 mg by mouth daily. Patient not taking: Reported on 05/23/2023     [provider]  Ensure (ENSURE) Take 237 mLs by mouth daily as needed.    [provider]  furosemide (LASIX) 40 MG tablet Take 1 tablet (40 mg total) by mouth daily. 10/26/22   Fletcher Anon, NP  guaiFENesin (MUCINEX) 600 MG 12 hr tablet Take 600 mg by mouth in the morning.    [provider]  guaiFENesin (MUCINEX) 600 MG 12 hr tablet Take 600 mg by mouth daily as needed for cough.    [provider]  ipratropium-albuterol (DUONEB) 0.5-2.5 (3) MG/3ML SOLN Take 3 mLs by nebulization every 6 (six) hours as needed (Wheezing/SOB). 08/23/22   [provider]  levothyroxine (SYNTHROID) 112 MCG tablet Take 112 mcg by mouth every morning. Patient not taking: Reported on 05/23/2023 07/01/20   [provider]  melatonin 5 MG TABS Take 5 mg by mouth at bedtime.    [provider]  metoprolol tartrate (LOPRESSOR) 25 MG tablet Take 0.5 tablets (12.5 mg total) by mouth 2 (two) times daily. 05/01/23   Fargo, [name] E, NP  nitroGLYCERIN (NITROSTAT) 0.4 MG SL tablet Place 1 tablet (0.4 mg total) under the tongue every 5 (five) minutes as needed for chest pain. 01/05/20   Jake Bathe, MD  ondansetron (ZOFRAN) 4 MG tablet Take 4 mg by mouth every 6 (six) hours as needed for nausea or vomiting.    [provider]  pantoprazole (PROTONIX) 40 MG tablet Take 1  tablet (40 mg total) by mouth daily. 03/23/23   Fletcher Anon, NP  polyethylene glycol powder (GLYCOLAX/MIRALAX) 17 GM/SCOOP powder Take 17 g by mouth daily as needed. 01/17/23   Fletcher Anon, NP  potassium chloride (KLOR-CON M) 10 MEQ tablet Take 1 tablet (10 mEq total) by mouth daily. 10/26/22   Fletcher Anon, NP  predniSONE (DELTASONE) 20 MG tablet Take 1 tablet (20 mg total) by mouth daily with breakfast for 5 days. 05/23/23 05/28/23  Octavia Heir, NP  Probiotic Product (ALIGN) 4 MG CAPS Take 4 mg by mouth daily.    [provider]  sennosides-docusate sodium (SENOKOT-S) 8.6-50  MG tablet Take 1 tablet by mouth at bedtime as needed for constipation. 01/21/21   Fletcher Anon, NP      Allergies    Cardizem [diltiazem], Eliquis [apixaban], and Tape    Review of Systems   Review of Systems  Physical Exam Updated Vital Signs BP (!) 170/117   Pulse (!) 116   Temp 97.8 F (36.6 C)   Resp 17   Wt 73.4 kg   LMP  (LMP Unknown)   SpO2 98%   BMI 27.95 kg/m  Physical Exam Vitals and nursing note reviewed.  Constitutional:      Appearance: She is well-developed. She is ill-appearing.  HENT:     Head: Normocephalic and atraumatic.  Eyes:     Extraocular Movements: Extraocular movements intact.     Conjunctiva/sclera: Conjunctivae normal.     Pupils: Pupils are equal, round, and reactive to light.  Cardiovascular:     Rate and Rhythm: Normal rate and regular rhythm.     Pulses: Normal pulses.     Heart sounds: Normal heart sounds. No murmur heard. Pulmonary:     Effort: Pulmonary effort is normal. No respiratory distress.     Breath sounds: Normal breath sounds.  Abdominal:     Palpations: Abdomen is soft.     Tenderness: There is no abdominal tenderness.  Musculoskeletal:        General: No swelling.     Cervical back: Neck supple.  Skin:    General: Skin is warm and dry.     Capillary Refill: Capillary refill takes less than 2 seconds.  Neurological:     Comments: Left side gaze preference, seems to have neglect of the right side, seems to be weak on the right compared to the left, unable to speak or follow commands much  Psychiatric:        Mood and Affect: Mood normal.     ED Results / Procedures / Treatments   Labs (all labs ordered are listed, but only abnormal results are displayed) Labs Reviewed  CBC - Abnormal; Notable for the following components:      Result Value   WBC 10.8 (*)    RBC 3.77 (*)    MCV 102.4 (*)    All other components within normal limits  DIFFERENTIAL - Abnormal; Notable for the following components:   Monocytes  Absolute 1.2 (*)    All other components within normal limits  COMPREHENSIVE METABOLIC PANEL - Abnormal; Notable for the following components:   BUN 44 (*)    Creatinine, Ser 2.35 (*)    Albumin 3.2 (*)    GFR, Estimated 18 (*)    All other components within normal limits  CBG MONITORING, ED - Abnormal; Notable for the following components:   Glucose-Capillary 61 (*)    All other components within normal limits  I-STAT CHEM 8,  ED - Abnormal; Notable for the following components:   BUN 43 (*)    Creatinine, Ser 2.50 (*)    Calcium, Ion 1.14 (*)    All other components within normal limits  CBG MONITORING, ED - Abnormal; Notable for the following components:   Glucose-Capillary 196 (*)    All other components within normal limits  RESP PANEL BY RT-PCR (RSV, FLU A&B, COVID)  RVPGX2  ETHANOL  PROTIME-INR  APTT  RAPID URINE DRUG SCREEN, HOSP PERFORMED  URINALYSIS, ROUTINE W REFLEX MICROSCOPIC  HEMOGLOBIN A1C  I-STAT CHEM 8, ED    EKG EKG Interpretation Date/Time:  Monday May 28 2023 10:18:05 EST Ventricular Rate:  123 PR Interval:    QRS Duration:  84 QT Interval:  333 QTC Calculation: 501 R Axis:   -28  Text Interpretation: Atrial fibrillation Borderline left axis deviation Confirmed by Virgina Norfolk [phone]) on 05/28/2023 10:21:45 AM  Radiology DG Chest Portable 1 View Result Date: 05/28/2023 CLINICAL DATA:  Code stroke with aphasia and weakness. EXAM: PORTABLE CHEST 1 VIEW COMPARISON:  09/06/2022 FINDINGS: Stable top-normal heart size. Stable elevation of the right hemidiaphragm. Decrease in congestive heart failure since the prior study with some chronic pulmonary interstitial prominence present. Resolution of bilateral pleural effusions since the prior study with potential mild chronic pleural effusion on the right. No pneumothorax. IMPRESSION: Decrease in congestive heart failure since the prior study with some chronic pulmonary interstitial prominence present.  Resolution of bilateral pleural effusions since the prior study with potential mild chronic pleural effusion on the right. Electronically Signed   By: Irish Lack M.D.   On: 05/28/2023 10:54   CT HEAD CODE STROKE WO CONTRAST Result Date: 05/28/2023 CLINICAL DATA:  Code stroke.  Neuro deficit, acute, stroke suspected EXAM: CT HEAD WITHOUT CONTRAST TECHNIQUE: Contiguous axial images were obtained from the base of the skull through the vertex without intravenous contrast. RADIATION DOSE REDUCTION: This exam was performed according to the departmental dose-optimization program which includes automated exposure control, adjustment of the mA and/or kV according to patient size and/or use of iterative reconstruction technique. COMPARISON:  None Available. FINDINGS: Motion limited study.  Within this limitation: Brain: No evidence of acute large vascular territory infarction, hemorrhage, hydrocephalus, extra-axial collection or mass lesion/mass effect. Patchy white matter hypodensities are nonspecific but compatible with chronic microvascular change. Vascular: No hyperdense vessel. Skull: No acute fracture. Sinuses/Orbits: Clear sinuses.  No acute orbital findings. Other: No mastoid effusions. ASPECTS [facility] Stroke Program Early CT Score) Total score (0-10 with 10 being normal): 10. IMPRESSION: 1. No evidence of acute intracranial abnormality. 2. ASPECTS is 10. Electronically Signed   By: Feliberto Harts M.D.   On: 05/28/2023 10:17    Procedures Procedures    Medications Ordered in ED Medications   stroke: early stages of recovery book (has no administration in time range)  dextrose 50 % solution 50 mL (50 mLs Intravenous Given 05/28/23 1001)    ED Course/ Medical Decision Making/ A&P                                 Medical Decision Making Amount and/or Complexity of Data Reviewed Radiology: ordered.  Risk Prescription drug management.   AMANIE BETHEA is here as a code stroke.   Patient hypertensive with A-fib in the low 100s upon arrival.  Dr. Otelia Limes with neurology at the bedside upon arrival to the ED.  Last known normal was last  night but concern for LVO.  She appears to have left-sided gaze, may be right-sided neglect, right-sided weakness.  May be some aphasia.  Patient is outside the window for TNK.  She does have chronic kidney disease and therefore CTA was held off.  She has a history of paroxysmal A-fib, breast cancer, hypertension, reflux.  History is limited.  Overall neurology does think that she likely had a stroke.  Will admit for further stroke workup.  Metabolic workup was initiated as well and thus far unremarkable per my review and interpretation of labs.  Patient has no significant leukocytosis or anemia or electrolyte abnormality.  Creatinine at baseline.  No signs of pneumonia on chest x-ray.  Patient to be admitted to medicine for further stroke/cephalopathic workup.  This chart was dictated using voice recognition software.  Despite best efforts to proofread,  errors can occur which can change the documentation meaning.         Final Clinical Impression(s) / ED Diagnoses Final diagnoses:  Stroke-like symptom    Rx / DC Orders ED Discharge Orders     None         Virgina Norfolk, DO 05/28/23 1143

## 2023-05-28 NOTE — Plan of Care (Addendum)
TRH night coverage note:  Using metoprolol IV PRN to try and get rate control of a.fib RVR.  Discussed pt having received gadolinium contrast earlier with Dr. Thedore Mins (nephrology),  Per Dr. Maurilio Lovely class 2 so "typically not worrisome".  He does NOT recommend dialysis because of that and because of pt age of 46.  He states risks of dialysis outweigh benefits."  Goal HR with a.fib with regards to elevated stroke recurrence risk is under 120 per Dr. Wilford Corner, currently at 116 per RN.  Will continue metoprolol IV PRN for rate control.

## 2023-05-28 NOTE — Code Documentation (Signed)
Stroke Response Nurse Documentation Code Documentation  Cassandra Ray is a 89 y.o. female arriving to [facility]  via [facility] on 12/22 with past medical hx of CHF, Htn, CKD, paroxysmal afib. On No antithrombotic. Code stroke was activated by EMS.   Patient from [facility] where she was LKW at 1200 yesterday and now complaining of aphasia and weakness, found by staff this morning after being in her usual state of health yesterday.    Stroke team at the bedside on patient arrival. Labs drawn and patient cleared for CT by Dr. Lockie Mola. Patient to CT with team. NIHSS 25, see documentation for details and code stroke times. Patient with disoriented, not following commands, left gaze preference , right hemianopia, right facial droop, right arm weakness, bilateral leg weakness, right decreased sensation, Global aphasia , dysarthria , and Visual  neglect on exam. The following imaging was completed:  CT Head. Patient is not a candidate for IV Thrombolytic due to being out of the treatment window. Patient is not a candidate for IR due to mRs 4.   Care Plan: q2 NIH and vitals.   Bedside handoff with ED RN Katrina.    Pearlie Oyster  Stroke Response RN

## 2023-05-29 ENCOUNTER — Encounter: Payer: Self-pay | Admitting: Family Medicine

## 2023-05-29 DIAGNOSIS — Z515 Encounter for palliative care: Secondary | ICD-10-CM

## 2023-05-29 DIAGNOSIS — I639 Cerebral infarction, unspecified: Secondary | ICD-10-CM | POA: Diagnosis not present

## 2023-05-29 LAB — LIPID PANEL
Cholesterol: 168 mg/dL (ref 0–200)
HDL: 96 mg/dL (ref >40)
LDL Cholesterol: 56 mg/dL (ref 0–99)
Total CHOL/HDL Ratio: 1.8 {ratio}
Triglycerides: 80 mg/dL (ref <150)
VLDL: 16 mg/dL (ref 0–40)

## 2023-05-29 LAB — BASIC METABOLIC PANEL
Anion gap: 11 (ref 5–15)
BUN: 35 mg/dL — ABNORMAL HIGH (ref 8–23)
CO2: 22 mmol/L (ref 22–32)
Calcium: 9.1 mg/dL (ref 8.9–10.3)
Chloride: 107 mmol/L (ref 98–111)
Creatinine, Ser: 1.78 mg/dL — ABNORMAL HIGH (ref 0.44–1.00)
GFR, Estimated: 25 mL/min — ABNORMAL LOW (ref >60)
Glucose, Bld: 134 mg/dL — ABNORMAL HIGH (ref 70–99)
Potassium: 4.3 mmol/L (ref 3.5–5.1)
Sodium: 140 mmol/L (ref 135–145)

## 2023-05-29 LAB — GLUCOSE, CAPILLARY
Glucose-Capillary: 122 mg/dL — ABNORMAL HIGH (ref 70–99)
Glucose-Capillary: 101 mg/dL — ABNORMAL HIGH (ref 70–99)
Glucose-Capillary: 111 mg/dL — ABNORMAL HIGH (ref 70–99)
Glucose-Capillary: 109 mg/dL — ABNORMAL HIGH (ref 70–99)

## 2023-05-29 LAB — TSH: TSH: 0.103 u[IU]/mL — ABNORMAL LOW (ref 0.350–4.500)

## 2023-05-29 LAB — CBC
HCT: 39.5 % (ref 36.0–46.0)
Hemoglobin: 13.3 g/dL (ref 12.0–15.0)
MCH: 33.1 pg (ref 26.0–34.0)
MCHC: 33.7 g/dL (ref 30.0–36.0)
MCV: 98.3 fL (ref 80.0–100.0)
Platelets: 217 10*3/uL (ref 150–400)
RBC: 4.02 MIL/uL (ref 3.87–5.11)
RDW: 14.4 % (ref 11.5–15.5)
WBC: 10.1 10*3/uL (ref 4.0–10.5)
nRBC: 0 % (ref 0.0–0.2)

## 2023-05-29 LAB — MAGNESIUM: Magnesium: 2.1 mg/dL (ref 1.7–2.4)

## 2023-05-29 LAB — PHOSPHORUS: Phosphorus: 3.6 mg/dL (ref 2.5–4.6)

## 2023-05-29 MED ORDER — ONDANSETRON 4 MG PO TBDP: 4.0000 mg | ORAL_TABLET | Freq: Four times a day (QID) | ORAL | Status: DC | PRN

## 2023-05-29 MED ORDER — LORAZEPAM 2 MG/ML PO CONC: 1.0000 mg | ORAL | Status: DC | PRN

## 2023-05-29 MED ORDER — ASPIRIN 300 MG RE SUPP: 300.0000 mg | Freq: Every day | RECTAL | Status: DC

## 2023-05-29 MED ORDER — MORPHINE SULFATE (CONCENTRATE) 10 MG /0.5 ML PO SOLN: 5.0000 mg | ORAL | Status: DC | PRN

## 2023-05-29 MED ORDER — LORAZEPAM 0.5 MG PO TABS: 0.5000 mg | ORAL_TABLET | ORAL | 0 refills | Status: DC | PRN

## 2023-05-29 MED ORDER — LORAZEPAM 1 MG PO TABS: 1.0000 mg | ORAL_TABLET | ORAL | Status: DC | PRN

## 2023-05-29 MED ORDER — GLYCOPYRROLATE 0.2 MG/ML IJ SOLN: 0.2000 mg | INTRAMUSCULAR | Status: DC | PRN

## 2023-05-29 MED ORDER — ASPIRIN 325 MG PO TBEC: 325.0000 mg | DELAYED_RELEASE_TABLET | Freq: Every day | ORAL | Status: DC

## 2023-05-29 MED ORDER — MORPHINE SULFATE (CONCENTRATE) 20 MG/ML PO SOLN: 5.0000 mg | ORAL | 0 refills | Status: DC | PRN

## 2023-05-29 MED ORDER — ONDANSETRON HCL 4 MG/2ML IJ SOLN: 4.0000 mg | Freq: Four times a day (QID) | INTRAMUSCULAR | Status: DC | PRN

## 2023-05-29 MED ORDER — LORAZEPAM 2 MG/ML IJ SOLN: 1.0000 mg | INTRAMUSCULAR | Status: DC | PRN

## 2023-05-29 MED ORDER — GLYCOPYRROLATE 1 MG PO TABS: 1.0000 mg | ORAL_TABLET | ORAL | Status: DC | PRN

## 2023-05-29 NOTE — TOC Transition Note (Addendum)
Transition of Care [facility]) - Discharge Note   Patient Details  Name: Cassandra Ray MRN: [id number] Date of Birth: [birth year]  Transition of Care [facility]) CM/SW Contact:  Carley Hammed, LCSW Phone Number: 05/29/2023, 1:11 PM   Clinical Narrative:    Pt to be transported to [facility] via [facility]. Nurse to call report to [phone] or [phone]. [facility] aware of transition to Hospice at facility.   Final next level of care: Other (comment) ([facility] W/ Hospice) Barriers to Discharge: Barriers Resolved   Patient Goals and CMS Choice            Discharge Placement              Patient chooses bed at: [facility] Patient to be transferred to facility by: [facility] Name of family member notified: Spouse Patient and family notified of of transfer: 05/29/23  Discharge Plan and Services Additional resources added to the After Visit Summary for                                       Social Drivers of Health (SDOH) Interventions SDOH Screenings   Food Insecurity: Patient Unable To Answer (05/28/2023)  Housing: Patient Unable To Answer (05/28/2023)  Transportation Needs: Patient Unable To Answer (05/28/2023)  Utilities: Patient Unable To Answer (05/28/2023)  Depression (PHQ2-9): Low Risk  (05/01/2023)  Tobacco Use: Medium Risk (05/28/2023)     Readmission Risk Interventions    09/07/2022   12:32 PM 09/05/2022    2:23 PM  Readmission Risk Prevention Plan  Transportation Screening Complete Complete  PCP or Specialist Appt within 5-7 Days Complete Complete  Home Care Screening Complete Complete  Medication Review (RN CM) Complete Complete

## 2023-05-29 NOTE — Progress Notes (Addendum)
PT Cancellation Note  Patient Details Name: Cassandra Ray MRN: [id number] DOB: November 17, 1922   Cancelled Treatment:    Reason Eval/Treat Not Completed: Patient not medically ready  Patient resting HR 133-144 bpm. Will attempt to see later today if HR lower.   15:11--Plan is now to transfer to Hospice. Will sign off.   Jerolyn Center, PT Acute Rehabilitation Services  Office [phone]   Zena Amos 05/29/2023, 11:11 AM

## 2023-05-29 NOTE — Discharge Summary (Signed)
Physician Discharge Summary   Patient: Cassandra Ray MRN: [id number] DOB: April 06, 1923  Admit date:     05/28/2023  Discharge date: 05/29/23  Discharge Physician: Cassandra Hawking, MD   PCP: Mahlon Gammon, MD   Recommendations at discharge:  Hospice care  Discharge Diagnoses: Principal Problem:   Acute ischemic stroke [facility]) Active Problems:   Essential hypertension   Hypothyroidism   Gastroesophageal reflux disease   Atrial fibrillation with RVR (HCC)   CKD (chronic kidney disease), stage IV (HCC)   Comfort measures only status  Resolved Problems:   * No resolved hospital problems. *  Hospital Course: Cassandra Ray is a 89 y.o. female with a history of chronic diastolic heart failure, hypertension, CKD stage IIIb, GERD, hypothyroidism, insomnia, paroxysmal atrial fibrillation, dysphagia.  Patient presented secondary to right-sided weakness in addition to mental status change/unresponsiveness and was found to have evidence of acute stroke.  Code stroke: Called in the ED.  Initial CT scan of the head was unremarkable for acute stroke, however left MCA stroke identified on subsequent MRI brain in addition to left-sided frontal lobe infarct.  Neurology consulted.  Patient with resultant severe aphasia and dysphagia.  Speech therapy evaluated and recommended n.p.o. with alternate means for nutrition.  After goals of care discussions, patient was transition to comfort measures with transfer back to her facility with hospice care.  Assessment and Plan:  Left MCA infarct Left frontal lobe infarct Code stroke called on presentation as patient presented with right-sided weakness and found unresponsive. Initial CT head (12/23) was significant for no acute intracranial abnormality. MRI brain (12/23) confirms acute cortical/subcortical left MCA territory infarct spanning 7 cm in addition to a small acute cortical infarct within the mid-left frontal lobe medially (ACA territory). MRA head and  neck significant for likely focal occlusion of proximal M2 segment of the left MCA in addition to right ICA occlusion at the origin level of the supraclinoid ICA. LDL of 56. Hemoglobin A1C of 5.2%. Transthoracic Echocardiogram significant for an LVEF of 60-65% with grade 3 diastolic dysfunction, severely reduced RV function and no atrial level shunt detected. Patient with severe aphasia and dysphagia. After goals of care discussions with neurology, medicine and family, decision made to transition patient to comfort measures. Patient discharged back to her long term care facility with hospice services.  Paroxysmal atrial fibrillation with RVR Uncontrolled while admitted. Patient managed with metoprolol IV.  Home medications discontinued secondary to severe dysphagia and goals of care decisions.  Chronic diastolic heart failure Stable. No evidence of fluid overload.  Home medications discontinued secondary to severe dysphagia and goals of care decisions.  CKD stage IIIb Noted.  Primary hypertension Noted. Home medications discontinued secondary to severe dysphagia and goals of care decisions.  Dysphagia Severe. Recommendation from speech therapy for NPO and alternate means of nutrition. Decision to avoid NG tube or PEG tube placement. Patient transitioned to comfort measures.   Consultants: Neurology Procedures performed: Transthoracic Echocardiogram  Disposition: Long term care with hospice Diet recommendation: As tolerated/desired; high aspiration risk. Manage symptoms with medications.   DISCHARGE MEDICATION: Allergies as of 05/29/2023       Reactions   Cardizem [diltiazem]    Eliquis [apixaban]    Tape Other (See Comments)   Patient prefers easy-release tape        Medication List     STOP taking these medications    acetaminophen 500 MG tablet Commonly known as: TYLENOL   Align 4 MG Caps   aluminum-magnesium hydroxide-simethicone  200-200-20 MG/5ML Susp Commonly known  as: MAALOX   arformoterol 15 MCG/2ML Nebu Commonly known as: Brovana   B-complex with vitamin C tablet   benzonatate 100 MG capsule Commonly known as: TESSALON   budesonide 0.5 MG/2ML nebulizer solution Commonly known as: Pulmicort   carbamide peroxide 6.5 % OTIC solution Commonly known as: DEBROX   docusate sodium 100 MG capsule Commonly known as: COLACE   furosemide 40 MG tablet Commonly known as: LASIX   ipratropium-albuterol 0.5-2.5 (3) MG/3ML Soln Commonly known as: DUONEB   levothyroxine 100 MCG tablet Commonly known as: SYNTHROID   melatonin 5 MG Tabs   metoprolol tartrate 25 MG tablet Commonly known as: LOPRESSOR   Mucinex 600 MG 12 hr tablet Generic drug: guaiFENesin   nitroGLYCERIN 0.4 MG SL tablet Commonly known as: NITROSTAT   Nutritional Drink Liqd   ondansetron 4 MG disintegrating tablet Commonly known as: ZOFRAN-ODT   pantoprazole 40 MG tablet Commonly known as: Protonix   polyethylene glycol powder 17 GM/SCOOP powder Commonly known as: GLYCOLAX/MIRALAX   potassium chloride 10 MEQ tablet Commonly known as: KLOR-CON M   predniSONE 20 MG tablet Commonly known as: DELTASONE   sennosides-docusate sodium 8.6-50 MG tablet Commonly known as: SENOKOT-S   Vitamin D3 50 MCG (2000 UT) Tabs       TAKE these medications    LORazepam 0.5 MG tablet Commonly known as: ATIVAN Take 1 tablet (0.5 mg total) by mouth every 4 (four) hours as needed for anxiety. May crush, mix with water and give sublingually if needed.   morphine 20 MG/ML concentrated solution Commonly known as: ROXANOL Take 0.25 mLs (5 mg total) by mouth every 4 (four) hours as needed for severe pain (pain score 7-10) or shortness of breath. May give sublingually if needed.        Discharge Exam: BP (!) 154/115 (BP Location: Right Arm)   Pulse (!) 120   Temp 100 F (37.8 C) (Oral)   Resp 16   Ht 5\' 3"  (1.6 m)   Wt 73.4 kg   LMP  (LMP Unknown)   SpO2 95%   BMI 28.66  kg/m   Respiratory system: Rhonchi. Slight accessory muscle usage Cardiovascular system: S1 & S2 heard, irregular rhythm with elevated rate. Gastrointestinal system: Abdomen is nondistended, soft and nontender. Normal bowel sounds heard. Central nervous system: Alert. Responds to voice by attempting to localize with head/eyes. Aphasia.   Condition at discharge: critical/comfort measures  The results of significant diagnostics from this hospitalization (including imaging, microbiology, ancillary and laboratory) are listed below for reference.   Imaging Studies: MR ANGIO NECK W WO CONTRAST Result Date: 05/28/2023 CLINICAL DATA:  Stroke/TIA, determine embolic source EXAM: MRA HEAD WITHOUT CONTRAST MRA NECK WITHOUT AND WITH CONTRAST TECHNIQUE: Angiographic images of the Circle of Willis were acquired using MRA technique without intravenous contrast. Angiographic images of the neck were acquired using MRA technique without and with intravenous contrast. Carotid stenosis measurements (when applicable) are obtained utilizing NASCET criteria, using the distal internal carotid diameter as the denominator. CONTRAST:  7mL GADAVIST GADOBUTROL 1 MMOL/ML IV SOLN COMPARISON:  Same day Brain MR FINDINGS: MRA HEAD FINDINGS Anterior circulation: There is likely focal occlusion of an M2 segment of the left MCA (series 257, image 80 / see screenshot). The right ICA is occluded to the level of the supraclinoid ICA. There is reconstitution likely via the right PCOM and A-comm. Posterior circulation: No aneurysm. No occlusion. No vascular malformation. Anatomic variants: There are prominent PCOM bilaterally. MRA  NECK FINDINGS Aortic arch: Standard three-vessel aortic arch. Origins of the bilateral vertebral arteries are poorly visualized due to respiratory motion artifact. Right carotid system: The right ICA is occluded at the origin. Left carotid system: The proximal and mid segments of the left common carotid artery  could not be visualized due to respiratory motion artifact. The left ICA is patent and normal in caliber. No evidence of dissection. No hemodynamically significant stenosis. Vertebral arteries: Origins of the bilateral vertebral arteries could not be assessed due to respiratory motion artifact. Within this limitation, codominant vertebral arteries that are patent to the vertebrobasilar junction. Other: None IMPRESSION: 1. Likely focal occlusion of a proximal M2 segment of the left MCA (see screenshot) 2. The right ICA is occluded at the origin to the level of the supraclinoid ICA. There is reconstitution via the right PCOM and A-comm/ACA. 3. The proximal and mid segments of the left common carotid artery could not be visualized due to respiratory motion artifact. The left ICA is patent and normal in caliber to the ICA terminus. These results will be called to the ordering clinician or representative by the Radiologist Assistant, and communication documented in the PACS or [employer]. Electronically Signed   By: Lorenza Cambridge M.D.   On: 05/28/2023 18:51   MR ANGIO HEAD WO CONTRAST Result Date: 05/28/2023 CLINICAL DATA:  Stroke/TIA, determine embolic source EXAM: MRA HEAD WITHOUT CONTRAST MRA NECK WITHOUT AND WITH CONTRAST TECHNIQUE: Angiographic images of the Circle of Willis were acquired using MRA technique without intravenous contrast. Angiographic images of the neck were acquired using MRA technique without and with intravenous contrast. Carotid stenosis measurements (when applicable) are obtained utilizing NASCET criteria, using the distal internal carotid diameter as the denominator. CONTRAST:  7mL GADAVIST GADOBUTROL 1 MMOL/ML IV SOLN COMPARISON:  Same day Brain MR FINDINGS: MRA HEAD FINDINGS Anterior circulation: There is likely focal occlusion of an M2 segment of the left MCA (series 257, image 80 / see screenshot). The right ICA is occluded to the level of the supraclinoid ICA. There is  reconstitution likely via the right PCOM and A-comm. Posterior circulation: No aneurysm. No occlusion. No vascular malformation. Anatomic variants: There are prominent PCOM bilaterally. MRA NECK FINDINGS Aortic arch: Standard three-vessel aortic arch. Origins of the bilateral vertebral arteries are poorly visualized due to respiratory motion artifact. Right carotid system: The right ICA is occluded at the origin. Left carotid system: The proximal and mid segments of the left common carotid artery could not be visualized due to respiratory motion artifact. The left ICA is patent and normal in caliber. No evidence of dissection. No hemodynamically significant stenosis. Vertebral arteries: Origins of the bilateral vertebral arteries could not be assessed due to respiratory motion artifact. Within this limitation, codominant vertebral arteries that are patent to the vertebrobasilar junction. Other: None IMPRESSION: 1. Likely focal occlusion of a proximal M2 segment of the left MCA (see screenshot) 2. The right ICA is occluded at the origin to the level of the supraclinoid ICA. There is reconstitution via the right PCOM and A-comm/ACA. 3. The proximal and mid segments of the left common carotid artery could not be visualized due to respiratory motion artifact. The left ICA is patent and normal in caliber to the ICA terminus. These results will be called to the ordering clinician or representative by the Radiologist Assistant, and communication documented in the PACS or [employer]. Electronically Signed   By: Lorenza Cambridge M.D.   On: 05/28/2023 18:51   ECHOCARDIOGRAM COMPLETE  Result Date: 05/28/2023    ECHOCARDIOGRAM REPORT   Patient Name:   ZACHARIAH DAMO Date of Exam: 05/28/2023 Medical Rec #:  [id number]        Height:       63.0 in Accession #:    [id number]       Weight:       161.8 lb Date of Birth:  03-18-1923       BSA:          1.767 m Patient Age:    89 years         BP:           160/108 mmHg  Patient Gender: F                HR:           120 bpm. Exam Location:  Inpatient Procedure: 2D Echo, Color Doppler and Cardiac Doppler Indications:    Stroke I63.9  History:        Patient has prior history of Echocardiogram examinations, most                 recent 09/07/2022.  Sonographer:    Harriette Bouillon RDCS Referring Phys: [phone] ERIC LINDZEN IMPRESSIONS  1. Left ventricular ejection fraction, by estimation, is 60 to 65%. The left ventricle has normal function. The left ventricle has no regional wall motion abnormalities. Left ventricular diastolic parameters are consistent with Grade III diastolic dysfunction (restrictive).  2. Right ventricular systolic function is severely reduced. The right ventricular size is moderately enlarged. There is severely elevated pulmonary artery systolic pressure.  3. Left atrial size was mildly dilated.  4. The mitral valve is degenerative. Mild mitral valve regurgitation. Moderate mitral annular calcification.  5. Tricuspid valve regurgitation is moderate to severe.  6. The aortic valve is tricuspid. Aortic valve regurgitation is not visualized. Aortic valve sclerosis is present, with no evidence of aortic valve stenosis.  7. The inferior vena cava is dilated in size with <50% respiratory variability, suggesting right atrial pressure of 15 mmHg. FINDINGS  Left Ventricle: Left ventricular ejection fraction, by estimation, is 60 to 65%. The left ventricle has normal function. The left ventricle has no regional wall motion abnormalities. The left ventricular internal cavity size was small. There is no left ventricular hypertrophy. Left ventricular diastolic parameters are consistent with Grade III diastolic dysfunction (restrictive). Right Ventricle: The right ventricular size is moderately enlarged. No increase in right ventricular wall thickness. Right ventricular systolic function is severely reduced. There is severely elevated pulmonary artery systolic pressure. The tricuspid  regurgitant velocity is 3.37 m/s, and with an assumed right atrial pressure of 15 mmHg, the estimated right ventricular systolic pressure is 60.4 mmHg. Left Atrium: Left atrial size was mildly dilated. Right Atrium: Right atrial size was normal in size. Pericardium: There is no evidence of pericardial effusion. Mitral Valve: The mitral valve is degenerative in appearance. There is mild thickening of the mitral valve leaflet(s). Moderately decreased mobility of the mitral valve leaflets. Moderate mitral annular calcification. Mild mitral valve regurgitation. Tricuspid Valve: The tricuspid valve is normal in structure. Tricuspid valve regurgitation is moderate to severe. Aortic Valve: The aortic valve is tricuspid. Aortic valve regurgitation is not visualized. Aortic valve sclerosis is present, with no evidence of aortic valve stenosis. Pulmonic Valve: The pulmonic valve was normal in structure. Pulmonic valve regurgitation is trivial. Aorta: The aortic root and ascending aorta are structurally normal, with no evidence of dilitation. Venous: The inferior vena  cava is dilated in size with less than 50% respiratory variability, suggesting right atrial pressure of 15 mmHg. IAS/Shunts: No atrial level shunt detected by color flow Doppler.  LEFT VENTRICLE PLAX 2D LVIDd:         3.20 cm LVIDs:         2.20 cm LV PW:         1.00 cm LV IVS:        1.10 cm LVOT diam:     1.50 cm LV SV:         30 LV SV Index:   17 LVOT Area:     1.77 cm  RIGHT VENTRICLE             IVC RV S prime:     10.90 cm/s  IVC diam: 2.30 cm TAPSE (M-mode): 0.8 cm LEFT ATRIUM             Index        RIGHT ATRIUM           Index LA diam:        3.70 cm 2.09 cm/m   RA Area:     14.70 cm LA Vol (A2C):   58.8 ml 33.27 ml/m  RA Volume:   35.10 ml  19.86 ml/m LA Vol (A4C):   66.5 ml 37.63 ml/m LA Biplane Vol: 64.2 ml 36.33 ml/m  AORTIC VALVE LVOT Vmax:   87.80 cm/s LVOT Vmean:  58.000 cm/s LVOT VTI:    0.169 m  AORTA Ao Root diam: 3.00 cm Ao Asc diam:   3.40 cm MITRAL VALVE                TRICUSPID VALVE MV Area (PHT): 4.93 cm     TR Peak grad:   45.4 mmHg MV Decel Time: 154 msec     TR Vmax:        337.00 cm/s MV E velocity: 160.00 cm/s MV A velocity: 43.10 cm/s   SHUNTS MV E/A ratio:  3.71         Systemic VTI:  0.17 m                             Systemic Diam: 1.50 cm Clearnce Hasten Electronically signed by Clearnce Hasten Signature Date/Time: 05/28/2023/4:18:36 PM    Final    MR BRAIN WO CONTRAST Result Date: 05/28/2023 CLINICAL DATA:  Provided history: Stroke, follow-up. EXAM: MRI HEAD WITHOUT CONTRAST TECHNIQUE: Multiplanar, multiecho pulse sequences of the brain and surrounding structures were obtained without intravenous contrast. COMPARISON:  Non-contrast head CT performed earlier today 05/28/2023. FINDINGS: Brain: Generalized cerebral atrophy, mild for age. Acute cortical/subcortical left MCA territory infarct affecting portions of the mid and posterior left frontal lobe, left insula and left parietal lobe (spanning 7 cm). Of note, there is involvement of the pre and postcentral gyri. Small acute cortical infarct within the mid left frontal lobe medially (ACA vascular territory). No significant mass effect related to these infarcts. No evidence of hemorrhagic conversion. Background severe patchy and confluent T2 FLAIR hyperintense signal abnormality within the cerebral white matter, nonspecific but compatible with chronic small vessel ischemic disease. Mild chronic small vessel ischemic changes also noted within the pons. Small chronic infarcts within the left cerebellar hemisphere. Partially empty sella turcica. No evidence of an intracranial mass. No extra-axial fluid collection. No midline shift. Vascular: A flow void is poorly delineated within portions of the right internal carotid artery siphon,  which may reflect high-grade stenosis or vessel occlusion. Skull and upper cervical spine: No focal worrisome marrow lesion. Sinuses/Orbits: Leftward  gaze. Prior bilateral ocular lens replacement. Minimal mucosal thickening within the bilateral ethmoid sinuses. Impressions #1, #2 and #3 called by telephone at the time of interpretation on 05/28/2023 at 12:25 pm to provider Dr. Lockie Mola, who verbally acknowledged these results. IMPRESSION: 1. Acute cortical/subcortical left MCA territory infarct spanning 7 cm, as described. 2. Small acute cortical infarct within the mid left frontal lobe medially (ACA vascular territory). 3. A flow void is poorly delineated within portions of the right internal carotid artery siphon, which could reflect high-grade stenosis or vessel occlusion. Consider CT or MR angiography for further evaluation. 4. Background severe chronic small vessel ischemic disease. 5. Small chronic infarcts within the left cerebellar hemisphere. 6. Mild generalized cerebral atrophy. Electronically Signed   By: Jackey Loge D.O.   On: 05/28/2023 12:27   DG Chest Portable 1 View Result Date: 05/28/2023 CLINICAL DATA:  Code stroke with aphasia and weakness. EXAM: PORTABLE CHEST 1 VIEW COMPARISON:  09/06/2022 FINDINGS: Stable top-normal heart size. Stable elevation of the right hemidiaphragm. Decrease in congestive heart failure since the prior study with some chronic pulmonary interstitial prominence present. Resolution of bilateral pleural effusions since the prior study with potential mild chronic pleural effusion on the right. No pneumothorax. IMPRESSION: Decrease in congestive heart failure since the prior study with some chronic pulmonary interstitial prominence present. Resolution of bilateral pleural effusions since the prior study with potential mild chronic pleural effusion on the right. Electronically Signed   By: Irish Lack M.D.   On: 05/28/2023 10:54   CT HEAD CODE STROKE WO CONTRAST Result Date: 05/28/2023 CLINICAL DATA:  Code stroke.  Neuro deficit, acute, stroke suspected EXAM: CT HEAD WITHOUT CONTRAST TECHNIQUE: Contiguous axial  images were obtained from the base of the skull through the vertex without intravenous contrast. RADIATION DOSE REDUCTION: This exam was performed according to the departmental dose-optimization program which includes automated exposure control, adjustment of the mA and/or kV according to patient size and/or use of iterative reconstruction technique. COMPARISON:  None Available. FINDINGS: Motion limited study.  Within this limitation: Brain: No evidence of acute large vascular territory infarction, hemorrhage, hydrocephalus, extra-axial collection or mass lesion/mass effect. Patchy white matter hypodensities are nonspecific but compatible with chronic microvascular change. Vascular: No hyperdense vessel. Skull: No acute fracture. Sinuses/Orbits: Clear sinuses.  No acute orbital findings. Other: No mastoid effusions. ASPECTS [facility] Stroke Program Early CT Score) Total score (0-10 with 10 being normal): 10. IMPRESSION: 1. No evidence of acute intracranial abnormality. 2. ASPECTS is 10. Electronically Signed   By: Feliberto Harts M.D.   On: 05/28/2023 10:17    Microbiology: Results for orders placed or performed during the hospital encounter of 05/28/23  Resp panel by RT-PCR (RSV, Flu A&B, Covid) Anterior Nasal Swab     Status: None   Collection Time: 05/28/23 11:20 AM   Specimen: Anterior Nasal Swab  Result Value Ref Range Status   SARS Coronavirus 2 by RT PCR NEGATIVE NEGATIVE Final   Influenza A by PCR NEGATIVE NEGATIVE Final   Influenza B by PCR NEGATIVE NEGATIVE Final    Comment: (NOTE) The Xpert Xpress SARS-CoV-2/FLU/RSV plus assay is intended as an aid in the diagnosis of influenza from Nasopharyngeal swab specimens and should not be used as a sole basis for treatment. Nasal washings and aspirates are unacceptable for Xpert Xpress SARS-CoV-2/FLU/RSV testing.  Fact Sheet for Patients: BloggerCourse.com  Fact  Sheet for Healthcare  Providers: SeriousBroker.it  This test is not yet approved or cleared by the [place] and has been authorized for detection and/or diagnosis of SARS-CoV-2 by FDA under an Emergency Use Authorization (EUA). This EUA will remain in effect (meaning this test can be used) for the duration of the COVID-19 declaration under Section 564(b)(1) of the Act, 21 U.S.C. section 360bbb-3(b)(1), unless the authorization is terminated or revoked.     Resp Syncytial Virus by PCR NEGATIVE NEGATIVE Final    Comment: (NOTE) Fact Sheet for Patients: BloggerCourse.com  Fact Sheet for Healthcare Providers: SeriousBroker.it  This test is not yet approved or cleared by the [place] FDA and has been authorized for detection and/or diagnosis of SARS-CoV-2 by FDA under an Emergency Use Authorization (EUA). This EUA will remain in effect (meaning this test can be used) for the duration of the COVID-19 declaration under Section 564(b)(1) of the Act, 21 U.S.C. section 360bbb-3(b)(1), unless the authorization is terminated or revoked.  Performed at [facility], [address] [place], [place] [address]     Labs: CBC: Recent Labs  Lab 05/28/23 1003 05/28/23 1005 05/29/23 0503  WBC  --  10.8* 10.1  NEUTROABS  --  6.1  --   HGB 12.6 12.4 13.3  HCT 37.0 38.6 39.5  MCV  --  102.4* 98.3  PLT  --  205 217   Basic Metabolic Panel: Recent Labs  Lab 05/28/23 1003 05/28/23 1005 05/29/23 0503  NA 139 138 140  K 4.2 4.3 4.3  CL 104 104 107  CO2  --  26 22  GLUCOSE 83 87 134*  BUN 43* 44* 35*  CREATININE 2.50* 2.35* 1.78*  CALCIUM  --  9.5 9.1  MG  --   --  2.1  PHOS  --   --  3.6   Liver Function Tests: Recent Labs  Lab 05/28/23 1005  AST 25  ALT 15  ALKPHOS 87  BILITOT 0.9  PROT 6.6  ALBUMIN 3.2*   CBG: Recent Labs  Lab 05/28/23 1859 05/28/23 2350 05/29/23 0446 05/29/23 0808  05/29/23 1211  GLUCAP 105* 118* 122* 101* 111*    Discharge time spent: 35 minutes.  Signed: Jacquelin Hawking, MD [facility] 05/29/2023

## 2023-05-29 NOTE — Evaluation (Signed)
Clinical/Bedside Swallow Evaluation Patient Details  Name: Cassandra Ray MRN: [id number] Date of Birth: 08-Oct-1922  Today's Date: 05/29/2023 Time: SLP Start Time (ACUTE ONLY): 6962 SLP Stop Time (ACUTE ONLY): 0909 SLP Time Calculation (min) (ACUTE ONLY): 15 min  Past Medical History:  Past Medical History:  Diagnosis Date   Actinic keratoses    Arthritis    back    Atrial fibrillation with RVR (HCC)    Breast cancer (HCC)    Breast cancer (right)   Chronic diastolic CHF (congestive heart failure) (HCC)    CKD (chronic kidney disease), stage III (HCC)    Compression fracture of L3 lumbar vertebra    Dysphagia    GERD (gastroesophageal reflux disease)    takes otc Pepcid as needed   Hiatal hernia 11/27/2014   HTN (hypertension)    Hypothyroidism    Insomnia    Osteopenia    PAF (paroxysmal atrial fibrillation) (HCC)    a. questionable episode in 2015 in [place]. b. event monitor through December/January 2014/2015 which showed no evidence of atrial fibrillation. c. Dx 12/2018.   PAT (paroxysmal atrial tachycardia) (HCC)    Pleural effusion    Past Surgical History:  Past Surgical History:  Procedure Laterality Date   BREAST LUMPECTOMY Right 1995   lumpectomy   COLONOSCOPY     ESOPHAGOGASTRODUODENOSCOPY (EGD) WITH PROPOFOL Left 01/09/2019   Procedure: ESOPHAGOGASTRODUODENOSCOPY (EGD) WITH PROPOFOL;  Surgeon: Jeani Hawking, MD;  Location: [facility];  Service: Endoscopy;  Laterality: Left;   EYE SURGERY Bilateral    cataract w/ lens implant   KYPHOPLASTY N/A 09/18/2013   Procedure: KYPHOPLASTY;  Surgeon: Emilee Hero, MD;  Location: [facility] OR;  Service: Orthopedics;  Laterality: N/A;  Lumbar 3 kyphoplasty   TONSILLECTOMY     HPI:  Cassandra Ray is a 89 y.o. female with medical history significant of chronic diastolic HF, HTN, CKD 3B, GERD, hypothyroidism, insomnia, paroxysmal A-fib and dysphagia presented via EMS from SNF for evaluation of right-sided weakness. Per  son, patient currently resides at [facility] and receives 24-hour care.  MRI Acute cortical/subcortical left MCA territory infarct spanning 7 cm, as described, Small acute cortical infarct within the mid left frontal lobe  medially. BSE 09/03/22 multiple swallows concerning for pharyngeal retention as noted on MBS 2019. MBS 09/04/22 no penetration or aspiration, recommended full liquids. Pt seen next day iwth coughing after thin concerning for aspiration and recommended thin and nectar liquids and pt desired to continue full liquids.    Assessment / Plan / Recommendation  Clinical Impression  Pt awake but drowsy, congested respirations at baseline and unable to follow commands for oral motor. No vocalizations elicited. Slight right facial droop at rest and appears to have majority of dentition intact. There was reduced labial seal on spoon with decreased oral manipulation and significant anterior spill with applesauce over multiple trials. Eventually she managed several weak and suspected incomplete swallows with majority of applesauce remaining in oral cavity (may have just swallowed her secretions versus transiting bolus?). Dry spoons and verbal cues not effective and applesuace was suctioned from oral cavity. Pt should remain NPO and discussed with Dr. Caleb Popp who plans to have goals of care discussion with family versus [facility] at this time. Plan to follow up after goals of care discussion with MD to assist in direction re: swallowing/nutrition. SLP Visit Diagnosis: Dysphagia, unspecified (R13.10)    Aspiration Risk  Moderate aspiration risk    Diet Recommendation NPO    Medication Administration: Via alternative means  Other  Recommendations Oral Care Recommendations: Oral care QID    Recommendations for follow up therapy are one component of a multi-disciplinary discharge planning process, led by the attending physician.  Recommendations may be updated based on patient status, additional  functional criteria and insurance authorization.  Follow up Recommendations  (TBD)      Assistance Recommended at Discharge    Functional Status Assessment Patient has had a recent decline in their functional status and demonstrates the ability to make significant improvements in function in a reasonable and predictable amount of time.  Frequency and Duration min 2x/week  2 weeks       Prognosis Prognosis for improved oropharyngeal function: Fair Barriers to Reach Goals: Other (Comment) (aphasia)      Swallow Study   General HPI: Cassandra Ray is a 89 y.o. female with medical history significant of chronic diastolic HF, HTN, CKD 3B, GERD, hypothyroidism, insomnia, paroxysmal A-fib and dysphagia presented via EMS from SNF for evaluation of right-sided weakness. Per son, patient currently resides at [facility] and receives 24-hour care.  MRI Acute cortical/subcortical left MCA territory infarct spanning 7 cm, as described, Small acute cortical infarct within the mid left frontal lobe  medially. BSE 09/03/22 multiple swallows concerning for pharyngeal retention as noted on MBS 2019. MBS 09/04/22 no penetration or aspiration, recommended full liquids. Pt seen next day iwth coughing after thin concerning for aspiration and recommended thin and nectar liquids and pt desired to continue full liquids. Type of Study: Bedside Swallow Evaluation Previous Swallow Assessment:  (see HPI) Diet Prior to this Study: NPO Temperature Spikes Noted: No Respiratory Status: Room air History of Recent Intubation: No Behavior/Cognition: Lethargic/Drowsy (awake) Oral Cavity Assessment: Within Functional Limits Oral Care Completed by SLP: No Oral Cavity - Dentition: Adequate natural dentition (unable to fully view) Vision: Functional for self-feeding Self-Feeding Abilities: Total assist Patient Positioning: Upright in bed Baseline Vocal Quality:  (no vocalizations) Volitional Cough: Cognitively unable to  elicit Volitional Swallow: Unable to elicit    Oral/Motor/Sensory Function Overall Oral Motor/Sensory Function:  (pt unable)   Ice Chips Ice chips: Not tested   Thin Liquid Thin Liquid: Not tested    Nectar Thick Nectar Thick Liquid: Not tested   Honey Thick Honey Thick Liquid: Not tested   Puree Puree: Impaired Presentation: Spoon Oral Phase Impairments: Reduced labial seal;Reduced lingual movement/coordination;Poor awareness of bolus Oral Phase Functional Implications: Left anterior spillage;Right anterior spillage;Oral holding;Prolonged oral transit Pharyngeal Phase Impairments: Decreased hyoid-laryngeal movement   Solid     Solid: Not tested      Cassandra Ray 05/29/2023,9:32 AM

## 2023-05-29 NOTE — Hospital Course (Signed)
Cassandra Ray is a 89 y.o. female with a history of chronic diastolic heart failure, hypertension, CKD stage IIIb, GERD, hypothyroidism, insomnia, paroxysmal atrial fibrillation, dysphagia.  Patient presented secondary to right-sided weakness in addition to mental status change/unresponsiveness and was found to have evidence of acute stroke.  Code stroke: Called in the ED.  Initial CT scan of the head was unremarkable for acute stroke, however left MCA stroke identified on subsequent MRI brain in addition to left-sided frontal lobe infarct.  Neurology consulted.  Patient with resultant severe aphasia and dysphagia.  Speech therapy evaluated and recommended n.p.o. with alternate means for nutrition.  After goals of care discussions, patient was transition to comfort measures with transfer back to her facility with hospice care.

## 2023-05-29 NOTE — Progress Notes (Signed)
STROKE TEAM PROGRESS NOTE   SUBJECTIVE (INTERVAL HISTORY) Her husband, daughter and son as well as granddaughter are at the bedside. Pt lying in bed, lethargic and eyes open on voice but not stay opening. Intermittent SOB with wheezing. Not following commands and nonverbal.    OBJECTIVE Temp:  [97.3 F (36.3 C)-100 F (37.8 C)] 100 F (37.8 C) (12/24 1207) Pulse Rate:  [94-136] 120 (12/24 1207) Cardiac Rhythm: Atrial fibrillation (12/24 0806) Resp:  [16-19] 16 (12/24 1207) BP: (148-175)/(101-124) 154/115 (12/24 1207) SpO2:  [94 %-100 %] 95 % (12/24 1207) Weight:  [73.4 kg] 73.4 kg (12/23 1403)  Recent Labs  Lab 05/28/23 1859 05/28/23 2350 05/29/23 0446 05/29/23 0808 05/29/23 1211  GLUCAP 105* 118* 122* 101* 111*   Recent Labs  Lab 05/28/23 1003 05/28/23 1005 05/29/23 0503  NA 139 138 140  K 4.2 4.3 4.3  CL 104 104 107  CO2  --  26 22  GLUCOSE 83 87 134*  BUN 43* 44* 35*  CREATININE 2.50* 2.35* 1.78*  CALCIUM  --  9.5 9.1  MG  --   --  2.1  PHOS  --   --  3.6   Recent Labs  Lab 05/28/23 1005  AST 25  ALT 15  ALKPHOS 87  BILITOT 0.9  PROT 6.6  ALBUMIN 3.2*   Recent Labs  Lab 05/28/23 1003 05/28/23 1005 05/29/23 0503  WBC  --  10.8* 10.1  NEUTROABS  --  6.1  --   HGB 12.6 12.4 13.3  HCT 37.0 38.6 39.5  MCV  --  102.4* 98.3  PLT  --  205 217   No results for input(s): "CKTOTAL", "CKMB", "CKMBINDEX", "TROPONINI" in the last 168 hours. Recent Labs    05/28/23 1005  LABPROT 13.5  INR 1.0   Recent Labs    05/28/23 1816  COLORURINE YELLOW  LABSPEC 1.015  PHURINE 6.0  GLUCOSEU NEGATIVE  HGBUR NEGATIVE  BILIRUBINUR NEGATIVE  KETONESUR NEGATIVE  PROTEINUR NEGATIVE  NITRITE NEGATIVE  LEUKOCYTESUR NEGATIVE       Component Value Date/Time   CHOL 168 05/29/2023 0503   TRIG 80 05/29/2023 0503   HDL 96 05/29/2023 0503   CHOLHDL 1.8 05/29/2023 0503   VLDL 16 05/29/2023 0503   LDLCALC 56 05/29/2023 0503   Lab Results  Component Value Date    HGBA1C 5.2 05/28/2023      Component Value Date/Time   LABOPIA NONE DETECTED 05/28/2023 1817   COCAINSCRNUR NONE DETECTED 05/28/2023 1817   LABBENZ NONE DETECTED 05/28/2023 1817   AMPHETMU NONE DETECTED 05/28/2023 1817   THCU NONE DETECTED 05/28/2023 1817   LABBARB NONE DETECTED 05/28/2023 1817    Recent Labs  Lab 05/28/23 1005  ETH <10    I have personally reviewed the radiological images below and agree with the radiology interpretations.  MR ANGIO NECK W WO CONTRAST Result Date: 05/28/2023 CLINICAL DATA:  Stroke/TIA, determine embolic source EXAM: MRA HEAD WITHOUT CONTRAST MRA NECK WITHOUT AND WITH CONTRAST TECHNIQUE: Angiographic images of the Circle of Willis were acquired using MRA technique without intravenous contrast. Angiographic images of the neck were acquired using MRA technique without and with intravenous contrast. Carotid stenosis measurements (when applicable) are obtained utilizing NASCET criteria, using the distal internal carotid diameter as the denominator. CONTRAST:  7mL GADAVIST GADOBUTROL 1 MMOL/ML IV SOLN COMPARISON:  Same day Brain MR FINDINGS: MRA HEAD FINDINGS Anterior circulation: There is likely focal occlusion of an M2 segment of the left MCA (series 257, image 80 /  see screenshot). The right ICA is occluded to the level of the supraclinoid ICA. There is reconstitution likely via the right PCOM and A-comm. Posterior circulation: No aneurysm. No occlusion. No vascular malformation. Anatomic variants: There are prominent PCOM bilaterally. MRA NECK FINDINGS Aortic arch: Standard three-vessel aortic arch. Origins of the bilateral vertebral arteries are poorly visualized due to respiratory motion artifact. Right carotid system: The right ICA is occluded at the origin. Left carotid system: The proximal and mid segments of the left common carotid artery could not be visualized due to respiratory motion artifact. The left ICA is patent and normal in caliber. No evidence of  dissection. No hemodynamically significant stenosis. Vertebral arteries: Origins of the bilateral vertebral arteries could not be assessed due to respiratory motion artifact. Within this limitation, codominant vertebral arteries that are patent to the vertebrobasilar junction. Other: None IMPRESSION: 1. Likely focal occlusion of a proximal M2 segment of the left MCA (see screenshot) 2. The right ICA is occluded at the origin to the level of the supraclinoid ICA. There is reconstitution via the right PCOM and A-comm/ACA. 3. The proximal and mid segments of the left common carotid artery could not be visualized due to respiratory motion artifact. The left ICA is patent and normal in caliber to the ICA terminus. These results will be called to the ordering clinician or representative by the Radiologist Assistant, and communication documented in the PACS or [employer]. Electronically Signed   By: Lorenza Cambridge M.D.   On: 05/28/2023 18:51   MR ANGIO HEAD WO CONTRAST Result Date: 05/28/2023 CLINICAL DATA:  Stroke/TIA, determine embolic source EXAM: MRA HEAD WITHOUT CONTRAST MRA NECK WITHOUT AND WITH CONTRAST TECHNIQUE: Angiographic images of the Circle of Willis were acquired using MRA technique without intravenous contrast. Angiographic images of the neck were acquired using MRA technique without and with intravenous contrast. Carotid stenosis measurements (when applicable) are obtained utilizing NASCET criteria, using the distal internal carotid diameter as the denominator. CONTRAST:  7mL GADAVIST GADOBUTROL 1 MMOL/ML IV SOLN COMPARISON:  Same day Brain MR FINDINGS: MRA HEAD FINDINGS Anterior circulation: There is likely focal occlusion of an M2 segment of the left MCA (series 257, image 80 / see screenshot). The right ICA is occluded to the level of the supraclinoid ICA. There is reconstitution likely via the right PCOM and A-comm. Posterior circulation: No aneurysm. No occlusion. No vascular malformation.  Anatomic variants: There are prominent PCOM bilaterally. MRA NECK FINDINGS Aortic arch: Standard three-vessel aortic arch. Origins of the bilateral vertebral arteries are poorly visualized due to respiratory motion artifact. Right carotid system: The right ICA is occluded at the origin. Left carotid system: The proximal and mid segments of the left common carotid artery could not be visualized due to respiratory motion artifact. The left ICA is patent and normal in caliber. No evidence of dissection. No hemodynamically significant stenosis. Vertebral arteries: Origins of the bilateral vertebral arteries could not be assessed due to respiratory motion artifact. Within this limitation, codominant vertebral arteries that are patent to the vertebrobasilar junction. Other: None IMPRESSION: 1. Likely focal occlusion of a proximal M2 segment of the left MCA (see screenshot) 2. The right ICA is occluded at the origin to the level of the supraclinoid ICA. There is reconstitution via the right PCOM and A-comm/ACA. 3. The proximal and mid segments of the left common carotid artery could not be visualized due to respiratory motion artifact. The left ICA is patent and normal in caliber to the ICA terminus. These  results will be called to the ordering clinician or representative by the Radiologist Assistant, and communication documented in the PACS or [employer]. Electronically Signed   By: Lorenza Cambridge M.D.   On: 05/28/2023 18:51   ECHOCARDIOGRAM COMPLETE Result Date: 05/28/2023    ECHOCARDIOGRAM REPORT   Patient Name:   YANICE PROSS Date of Exam: 05/28/2023 Medical Rec #:  [id number]        Height:       63.0 in Accession #:    [id number]       Weight:       161.8 lb Date of Birth:  09/29/1922       BSA:          1.767 m Patient Age:    89 years         BP:           160/108 mmHg Patient Gender: F                HR:           120 bpm. Exam Location:  Inpatient Procedure: 2D Echo, Color Doppler and Cardiac Doppler  Indications:    Stroke I63.9  History:        Patient has prior history of Echocardiogram examinations, most                 recent 09/07/2022.  Sonographer:    Harriette Bouillon RDCS Referring Phys: [phone] ERIC LINDZEN IMPRESSIONS  1. Left ventricular ejection fraction, by estimation, is 60 to 65%. The left ventricle has normal function. The left ventricle has no regional wall motion abnormalities. Left ventricular diastolic parameters are consistent with Grade III diastolic dysfunction (restrictive).  2. Right ventricular systolic function is severely reduced. The right ventricular size is moderately enlarged. There is severely elevated pulmonary artery systolic pressure.  3. Left atrial size was mildly dilated.  4. The mitral valve is degenerative. Mild mitral valve regurgitation. Moderate mitral annular calcification.  5. Tricuspid valve regurgitation is moderate to severe.  6. The aortic valve is tricuspid. Aortic valve regurgitation is not visualized. Aortic valve sclerosis is present, with no evidence of aortic valve stenosis.  7. The inferior vena cava is dilated in size with <50% respiratory variability, suggesting right atrial pressure of 15 mmHg. FINDINGS  Left Ventricle: Left ventricular ejection fraction, by estimation, is 60 to 65%. The left ventricle has normal function. The left ventricle has no regional wall motion abnormalities. The left ventricular internal cavity size was small. There is no left ventricular hypertrophy. Left ventricular diastolic parameters are consistent with Grade III diastolic dysfunction (restrictive). Right Ventricle: The right ventricular size is moderately enlarged. No increase in right ventricular wall thickness. Right ventricular systolic function is severely reduced. There is severely elevated pulmonary artery systolic pressure. The tricuspid regurgitant velocity is 3.37 m/s, and with an assumed right atrial pressure of 15 mmHg, the estimated right ventricular systolic pressure  is 60.4 mmHg. Left Atrium: Left atrial size was mildly dilated. Right Atrium: Right atrial size was normal in size. Pericardium: There is no evidence of pericardial effusion. Mitral Valve: The mitral valve is degenerative in appearance. There is mild thickening of the mitral valve leaflet(s). Moderately decreased mobility of the mitral valve leaflets. Moderate mitral annular calcification. Mild mitral valve regurgitation. Tricuspid Valve: The tricuspid valve is normal in structure. Tricuspid valve regurgitation is moderate to severe. Aortic Valve: The aortic valve is tricuspid. Aortic valve regurgitation is not visualized. Aortic valve sclerosis is  present, with no evidence of aortic valve stenosis. Pulmonic Valve: The pulmonic valve was normal in structure. Pulmonic valve regurgitation is trivial. Aorta: The aortic root and ascending aorta are structurally normal, with no evidence of dilitation. Venous: The inferior vena cava is dilated in size with less than 50% respiratory variability, suggesting right atrial pressure of 15 mmHg. IAS/Shunts: No atrial level shunt detected by color flow Doppler.  LEFT VENTRICLE PLAX 2D LVIDd:         3.20 cm LVIDs:         2.20 cm LV PW:         1.00 cm LV IVS:        1.10 cm LVOT diam:     1.50 cm LV SV:         30 LV SV Index:   17 LVOT Area:     1.77 cm  RIGHT VENTRICLE             IVC RV S prime:     10.90 cm/s  IVC diam: 2.30 cm TAPSE (M-mode): 0.8 cm LEFT ATRIUM             Index        RIGHT ATRIUM           Index LA diam:        3.70 cm 2.09 cm/m   RA Area:     14.70 cm LA Vol (A2C):   58.8 ml 33.27 ml/m  RA Volume:   35.10 ml  19.86 ml/m LA Vol (A4C):   66.5 ml 37.63 ml/m LA Biplane Vol: 64.2 ml 36.33 ml/m  AORTIC VALVE LVOT Vmax:   87.80 cm/s LVOT Vmean:  58.000 cm/s LVOT VTI:    0.169 m  AORTA Ao Root diam: 3.00 cm Ao Asc diam:  3.40 cm MITRAL VALVE                TRICUSPID VALVE MV Area (PHT): 4.93 cm     TR Peak grad:   45.4 mmHg MV Decel Time: 154 msec     TR  Vmax:        337.00 cm/s MV E velocity: 160.00 cm/s MV A velocity: 43.10 cm/s   SHUNTS MV E/A ratio:  3.71         Systemic VTI:  0.17 m                             Systemic Diam: 1.50 cm Clearnce Hasten Electronically signed by Clearnce Hasten Signature Date/Time: 05/28/2023/4:18:36 PM    Final    MR BRAIN WO CONTRAST Result Date: 05/28/2023 CLINICAL DATA:  Provided history: Stroke, follow-up. EXAM: MRI HEAD WITHOUT CONTRAST TECHNIQUE: Multiplanar, multiecho pulse sequences of the brain and surrounding structures were obtained without intravenous contrast. COMPARISON:  Non-contrast head CT performed earlier today 05/28/2023. FINDINGS: Brain: Generalized cerebral atrophy, mild for age. Acute cortical/subcortical left MCA territory infarct affecting portions of the mid and posterior left frontal lobe, left insula and left parietal lobe (spanning 7 cm). Of note, there is involvement of the pre and postcentral gyri. Small acute cortical infarct within the mid left frontal lobe medially (ACA vascular territory). No significant mass effect related to these infarcts. No evidence of hemorrhagic conversion. Background severe patchy and confluent T2 FLAIR hyperintense signal abnormality within the cerebral white matter, nonspecific but compatible with chronic small vessel ischemic disease. Mild chronic small vessel ischemic changes also noted within the pons.  Small chronic infarcts within the left cerebellar hemisphere. Partially empty sella turcica. No evidence of an intracranial mass. No extra-axial fluid collection. No midline shift. Vascular: A flow void is poorly delineated within portions of the right internal carotid artery siphon, which may reflect high-grade stenosis or vessel occlusion. Skull and upper cervical spine: No focal worrisome marrow lesion. Sinuses/Orbits: Leftward gaze. Prior bilateral ocular lens replacement. Minimal mucosal thickening within the bilateral ethmoid sinuses. Impressions #1, #2 and #3  called by telephone at the time of interpretation on 05/28/2023 at 12:25 pm to provider Dr. Lockie Mola, who verbally acknowledged these results. IMPRESSION: 1. Acute cortical/subcortical left MCA territory infarct spanning 7 cm, as described. 2. Small acute cortical infarct within the mid left frontal lobe medially (ACA vascular territory). 3. A flow void is poorly delineated within portions of the right internal carotid artery siphon, which could reflect high-grade stenosis or vessel occlusion. Consider CT or MR angiography for further evaluation. 4. Background severe chronic small vessel ischemic disease. 5. Small chronic infarcts within the left cerebellar hemisphere. 6. Mild generalized cerebral atrophy. Electronically Signed   By: Jackey Loge D.O.   On: 05/28/2023 12:27   DG Chest Portable 1 View Result Date: 05/28/2023 CLINICAL DATA:  Code stroke with aphasia and weakness. EXAM: PORTABLE CHEST 1 VIEW COMPARISON:  09/06/2022 FINDINGS: Stable top-normal heart size. Stable elevation of the right hemidiaphragm. Decrease in congestive heart failure since the prior study with some chronic pulmonary interstitial prominence present. Resolution of bilateral pleural effusions since the prior study with potential mild chronic pleural effusion on the right. No pneumothorax. IMPRESSION: Decrease in congestive heart failure since the prior study with some chronic pulmonary interstitial prominence present. Resolution of bilateral pleural effusions since the prior study with potential mild chronic pleural effusion on the right. Electronically Signed   By: Irish Lack M.D.   On: 05/28/2023 10:54   CT HEAD CODE STROKE WO CONTRAST Result Date: 05/28/2023 CLINICAL DATA:  Code stroke.  Neuro deficit, acute, stroke suspected EXAM: CT HEAD WITHOUT CONTRAST TECHNIQUE: Contiguous axial images were obtained from the base of the skull through the vertex without intravenous contrast. RADIATION DOSE REDUCTION: This exam was  performed according to the departmental dose-optimization program which includes automated exposure control, adjustment of the mA and/or kV according to patient size and/or use of iterative reconstruction technique. COMPARISON:  None Available. FINDINGS: Motion limited study.  Within this limitation: Brain: No evidence of acute large vascular territory infarction, hemorrhage, hydrocephalus, extra-axial collection or mass lesion/mass effect. Patchy white matter hypodensities are nonspecific but compatible with chronic microvascular change. Vascular: No hyperdense vessel. Skull: No acute fracture. Sinuses/Orbits: Clear sinuses.  No acute orbital findings. Other: No mastoid effusions. ASPECTS [facility] Stroke Program Early CT Score) Total score (0-10 with 10 being normal): 10. IMPRESSION: 1. No evidence of acute intracranial abnormality. 2. ASPECTS is 10. Electronically Signed   By: Feliberto Harts M.D.   On: 05/28/2023 10:17     PHYSICAL EXAM  Temp:  [97.3 F (36.3 C)-100 F (37.8 C)] 100 F (37.8 C) (12/24 1207) Pulse Rate:  [94-136] 120 (12/24 1207) Resp:  [16-19] 16 (12/24 1207) BP: (148-175)/(101-124) 154/115 (12/24 1207) SpO2:  [94 %-100 %] 95 % (12/24 1207) Weight:  [73.4 kg] 73.4 kg (12/23 1403)  General - Well nourished, well developed, lethargic.  Ophthalmologic - fundi not visualized due to noncooperation.  Cardiovascular - irregularly irregular heart rate and rhythm.  Respiratory - intermittent SOB with expiratory wheezing   Neuro - lethargic  and eyes closed, with voice eyes open briefly, not able to maintain opening without repetitive stimulation, nonverbal and not following any simple commands. Not able to name and repeat on request. With forced eye opening, eyes in the middle, able to tracking bilateral voices, not blinking to visual threat bilaterally. Right facial droop. Tongue protrusion not cooperative. With both arms lifted, left UE drift with some controlling, but RUE  flaccid. BLE withdraw to pain but left more than right. Sensation, coordination and gait not tested.    ASSESSMENT/PLAN Cassandra Ray is a 89 y.o. female with history of Afib RVR, breast cancer, CHF, CKD, HTN, limited activity at SNF admitted for right arm weakness and aphasia. No tPA given due to outside window.    Stroke:  left MCA and ACA infarct, embolic secondary to AF not on AC CT no acute finding MRI  left MCA and ACA infarcts MRA head and neck left M2 proximal occlusion, right ICA chronic occlusion 2D Echo  EF 60-65%, RV hypertrophy with decreased RVEF LDL 56 HgbA1c 5.2 UDS neg SCDs for VTE prophylaxis No antithrombotic prior to admission, now on aspirin 300 mg suppository daily.  Disposition:  had long discussion with family including husband, son and daughter. They are leaning towards hospice. Family would like to further discuss with Dr. Raynelle Dick attending physician.   AF Still has mild RVR Not on AC PTA On ASA 300 PR Will consider AC only if family decided on aggressive care  Hypertension Stable Permissive hypertension (OK if <220/120) for 24-48 hours post stroke and then gradually normalized within 5-7 days. Long term BP goal normotensive  Lipid management Home meds:  none  LDL 56, goal < 70 No statin needed at this time given LDL at goal  Other Stroke Risk Factors Advanced age CHF  Other Active Problems Breast cancer CKD 3b with cre 1.78 SNF resident with limited activity  Hospital day # 1  Neurology will sign off. Please call with questions. Thanks for the consult.   Marvel Plan, MD PhD Stroke Neurology 05/29/2023 1:36 PM    To contact Stroke Continuity provider, please refer to [email or web address]. After hours, contact General Neurology

## 2023-05-29 NOTE — Progress Notes (Addendum)
OT Cancellation Note  Patient Details Name: Cassandra Ray MRN: [id number] DOB: 1922/08/08   Cancelled Treatment:    Reason Eval/Treat Not Completed: Medical issues which prohibited therapy (elevated HR at rest up to 140bpm. Will follow up later if schedule permits/is medically appropraite)  1351: Plan is now hospice care at SNF, will s/o. Please reconsult OT as appropriate.  Carver Fila, OTD, OTR/L [facility] [phone]   Dalphine Handing 05/29/2023, 11:29 AM

## 2023-05-29 NOTE — Discharge Instructions (Signed)
Cassandra Ray,  You were in the hospital and found to have suffered a bad stroke. This has affected you greatly. You have been set up for transfer to your facility with hospice care.   It was a pleasure having had the opportunity to meet you and your family; you seem to have lovely people around you.

## 2023-05-29 NOTE — Evaluation (Signed)
Speech Language Pathology Evaluation Patient Details Name: BROOKELYNN BERGHORST MRN: [id number] DOB: [birth year] Today's Date: 05/29/2023 Time: 0900-0909 SLP Time Calculation (min) (ACUTE ONLY): 9 min  Problem List:  Patient Active Problem List   Diagnosis Date Noted   Acute ischemic stroke (HCC) 05/28/2023   Oropharyngeal dysphagia 09/17/2022   Palliative care by specialist 09/06/2022   Transaminitis 09/02/2022   CKD (chronic kidney disease), stage IV (HCC) 09/02/2022   CAP (community acquired pneumonia) 09/02/2022   Chronic kidney disease, stage III (moderate) (HCC) 08/09/2021   Upper GI bleed 01/08/2019   Atrial fibrillation with RVR (HCC)    Paroxysmal atrial fibrillation (HCC) 12/12/2018   Leukocytosis 12/12/2018   Gastroesophageal reflux disease    Tachycardia 05/24/2018   Bilateral impacted cerumen 11/22/2016   Presbycusis of both ears 11/22/2016   Pleural effusion, right 02/08/2016   Lower extremity edema 01/31/2016   Chronic heart failure with preserved ejection fraction (HFpEF) (HCC) 02/03/2015   Hyponatremia 04/25/2014   Shortness of breath    Compression fracture 09/18/2013   Knee pain 08/11/2013   Back pain 05/09/2013   Essential hypertension    Hypothyroidism    Past Medical History:  Past Medical History:  Diagnosis Date   Actinic keratoses    Arthritis    back    Atrial fibrillation with RVR (HCC)    Breast cancer (HCC)    Breast cancer (right)   Chronic diastolic CHF (congestive heart failure) (HCC)    CKD (chronic kidney disease), stage III (HCC)    Compression fracture of L3 lumbar vertebra    Dysphagia    GERD (gastroesophageal reflux disease)    takes otc Pepcid as needed   Hiatal hernia 11/27/2014   HTN (hypertension)    Hypothyroidism    Insomnia    Osteopenia    PAF (paroxysmal atrial fibrillation) (HCC)    a. questionable episode in 2015 in [place]. b. event monitor through December/January 2014/2015 which showed no evidence of atrial  fibrillation. c. Dx 12/2018.   PAT (paroxysmal atrial tachycardia) (HCC)    Pleural effusion    Past Surgical History:  Past Surgical History:  Procedure Laterality Date   BREAST LUMPECTOMY Right 1995   lumpectomy   COLONOSCOPY     ESOPHAGOGASTRODUODENOSCOPY (EGD) WITH PROPOFOL Left 01/09/2019   Procedure: ESOPHAGOGASTRODUODENOSCOPY (EGD) WITH PROPOFOL;  Surgeon: Jeani Hawking, MD;  Location: [facility];  Service: Endoscopy;  Laterality: Left;   EYE SURGERY Bilateral    cataract w/ lens implant   KYPHOPLASTY N/A 09/18/2013   Procedure: KYPHOPLASTY;  Surgeon: Emilee Hero, MD;  Location: [facility] OR;  Service: Orthopedics;  Laterality: N/A;  Lumbar 3 kyphoplasty   TONSILLECTOMY     HPI:  ARIAUNNA FLEMING is a 89 y.o. female with medical history significant of chronic diastolic HF, HTN, CKD 3B, GERD, hypothyroidism, insomnia, paroxysmal A-fib and dysphagia presented via EMS from SNF for evaluation of right-sided weakness. Per son, patient currently resides at [facility] and receives 24-hour care.  MRI Acute cortical/subcortical left MCA territory infarct spanning 7 cm, as described, Small acute cortical infarct within the mid left frontal lobe  medially. BSE 09/03/22 multiple swallows concerning for pharyngeal retention as noted on MBS 2019. MBS 09/04/22 no penetration or aspiration, recommended full liquids. Pt seen next day iwth coughing after thin concerning for aspiration and recommended thin and nectar liquids and pt desired to continue full liquids.   Assessment / Plan / Recommendation Clinical Impression  Pt presented with lethargy/drowsiness although kept eyes open during  assessment. She exhibited decreased sustained attention to therapist but could focus eye contact when name called briefly. She exhibits receptive and expressive aphasia and did not follow commands independently nor when given visual cues during assessment. There were no spontaneous vocalizations and therapist was unable  to elicit with automatic language tasks. Therapist will continue to see for language and assess cognition further wehn able on acute care and recommend pt will need further therapy < 3 hours a day post acute venue.    SLP Assessment  SLP Recommendation/Assessment: Patient needs continued Speech Lanaguage Pathology Services SLP Visit Diagnosis: Aphasia (R47.01);Cognitive communication deficit (R41.841)    Recommendations for follow up therapy are one component of a multi-disciplinary discharge planning process, led by the attending physician.  Recommendations may be updated based on patient status, additional functional criteria and insurance authorization.    Follow Up Recommendations  Skilled nursing-short term rehab (<3 hours/day)    Assistance Recommended at Discharge  Frequent or constant Supervision/Assistance  Functional Status Assessment Patient has had a recent decline in their functional status and demonstrates the ability to make significant improvements in function in a reasonable and predictable amount of time.  Frequency and Duration min 2x/week  2 weeks      SLP Evaluation Cognition  Overall Cognitive Status: Impaired/Different from baseline Arousal/Alertness: Lethargic Orientation Level:  (no response to yes no questions) Attention: Sustained Sustained Attention: Impaired Sustained Attention Impairment: Verbal basic Memory:  (to be assessed when/if able)       Comprehension  Auditory Comprehension Overall Auditory Comprehension: Impaired Yes/No Questions: Impaired Commands: Impaired One Step Basic Commands: 0-24% accurate Interfering Components: Attention Visual Recognition/Discrimination Discrimination: Not tested Reading Comprehension Reading Status: Not tested    Expression Expression Primary Mode of Expression: Other (comment) (non verbal) Verbal Expression Overall Verbal Expression: Impaired Initiation: Impaired Automatic Speech:  (no  response) Repetition:  (NT) Naming: Not tested Pragmatics: Impairment Impairments: Eye contact Interfering Components: Attention Written Expression Written Expression: Not tested   Oral / Motor  Oral Motor/Sensory Function Overall Oral Motor/Sensory Function:  (unable to follow commands. slight right sided facial weakness) Motor Speech Overall Motor Speech: Other (comment) (no vocalizations) Intelligibility: Unable to assess (comment)            Royce Macadamia 05/29/2023, 9:46 AM

## 2023-05-31 ENCOUNTER — Encounter: Payer: Self-pay | Admitting: Adult Health

## 2023-05-31 ENCOUNTER — Non-Acute Institutional Stay (SKILLED_NURSING_FACILITY): Payer: Medicare Other | Admitting: Adult Health

## 2023-05-31 DIAGNOSIS — I639 Cerebral infarction, unspecified: Secondary | ICD-10-CM

## 2023-05-31 DIAGNOSIS — R1312 Dysphagia, oropharyngeal phase: Secondary | ICD-10-CM

## 2023-05-31 DIAGNOSIS — I4891 Unspecified atrial fibrillation: Secondary | ICD-10-CM

## 2023-05-31 NOTE — Progress Notes (Signed)
Location:  Medical illustrator of Service:  SNF (31) Provider:   Peggye Ley, ANP [facility] [phone]   Mahlon Gammon, MD  Patient Care Team: Mahlon Gammon, MD as PCP - General (Internal Medicine) Jake Bathe, MD as PCP - Cardiology (Cardiology)  Extended Emergency Contact Information Primary Emergency Contact: Wilkie,Sarah Address: [address]          [place], [place] [address] Darden Amber of [place] Phone: [phone] Relation: Daughter Secondary Emergency Contact: Merrily Pew Address: [address]          [place], [place] [address] Darden Amber of [place] Phone: [phone] Relation: Spouse  Code Status:  DNR Hospice pending consult.  Goals of care: Advanced Directive information    05/28/2023   12:48 PM  Advanced Directives  Does Patient Have a Medical Advance Directive? Yes  Type of Estate agent of [place];Living will;Out of facility DNR (pink MOST or yellow form)  Does patient want to make changes to medical advance directive? Yes (Inpatient - patient defers changing a medical advance directive and declines information at this time)  Copy of Healthcare Power of Attorney in Chart? No - copy requested     Chief Complaint  Patient presents with   Acute Visit    Follow up hospitalization     HPI:  Pt is a 89 y.o. female seen today for a hospital f/u s/p admission from 05/28/23-05/29/23  PMH significant for breast ca, diastolic CHF, CKD, OP, GERD, PAF, asthma  She was admitted with right sided weakness and left gaze preference. MRI showed acute cortical subcortical left MCA territory infarct. She has severe dysphagia and aphasia post stroke. ST evaluated her and recommended NPO status. Her family decided to transition to comfort measures only. Hospice has been ordered.  She is not eating or drinking. She is not verbal. Did have some increased RR this am and morphine  was administered which helped per the nurse. Her husband Annette Stable is at her bedside.   She also had uncontrolled afib during her stay. IV metoprolol was given. Discontinued to goals of care. HR Now 147  Has chronic diastolic CHF no acute overload.   CKD worsened during hospital stay. BUN 44 2.35 on admit.  Past Medical History:  Diagnosis Date   Actinic keratoses    Arthritis    back    Atrial fibrillation with RVR (HCC)    Breast cancer (HCC)    Breast cancer (right)   Chronic diastolic CHF (congestive heart failure) (HCC)    CKD (chronic kidney disease), stage III (HCC)    Compression fracture of L3 lumbar vertebra    Dysphagia    GERD (gastroesophageal reflux disease)    takes otc Pepcid as needed   Hiatal hernia 11/27/2014   HTN (hypertension)    Hypothyroidism    Insomnia    Osteopenia    PAF (paroxysmal atrial fibrillation) (HCC)    a. questionable episode in 2015 in [facility]. b. event monitor through December/January 2014/2015 which showed no evidence of atrial fibrillation. c. Dx 12/2018.   PAT (paroxysmal atrial tachycardia) (HCC)    Pleural effusion    Past Surgical History:  Procedure Laterality Date   BREAST LUMPECTOMY Right 1995   lumpectomy   COLONOSCOPY     ESOPHAGOGASTRODUODENOSCOPY (EGD) WITH PROPOFOL Left 01/09/2019   Procedure: ESOPHAGOGASTRODUODENOSCOPY (EGD) WITH PROPOFOL;  Surgeon: Jeani Hawking, MD;  Location: [facility];  Service: Endoscopy;  Laterality: Left;  EYE SURGERY Bilateral    cataract w/ lens implant   KYPHOPLASTY N/A 09/18/2013   Procedure: KYPHOPLASTY;  Surgeon: Emilee Hero, MD;  Location: [facility] OR;  Service: Orthopedics;  Laterality: N/A;  Lumbar 3 kyphoplasty   TONSILLECTOMY      Allergies  Allergen Reactions   Cardizem [Diltiazem]    Eliquis [Apixaban]    Tape Other (See Comments)    Patient prefers easy-release tape    Outpatient Encounter Medications as of 05/31/2023  Medication Sig   LORazepam (ATIVAN) 0.5 MG tablet Take 1  tablet (0.5 mg total) by mouth every 4 (four) hours as needed for anxiety. May crush, mix with water and give sublingually if needed.   morphine (ROXANOL) 20 MG/ML concentrated solution Take 0.25 mLs (5 mg total) by mouth every 4 (four) hours as needed for severe pain (pain score 7-10) or shortness of breath. May give sublingually if needed.   No facility-administered encounter medications on file as of 05/31/2023.    Review of Systems  Unable to perform ROS: Dementia    Immunization History  Administered Date(s) Administered   Fluad Quad(high Dose 65+) 03/27/2023   Influenza Split 02/24/2010, 02/24/2014   Influenza, High Dose Seasonal PF 04/07/2015, 03/27/2018   Influenza,inj,Quad PF,6+ Mos 02/21/2016, 04/04/2017   Influenza-Unspecified 04/19/2012, 03/26/2013, 02/14/2019   Moderna Covid-19 Fall Seasonal Vaccine 31yrs & older 04/10/2022   Moderna Covid-19 Vaccine Bivalent Booster 8yrs & up 03/27/2023   Moderna SARS-COV2 Booster Vaccination 10/05/2021   Moderna Sars-Covid-2 Vaccination 06/15/2019, 07/18/2019, 04/28/2020   PNEUMOCOCCAL CONJUGATE-20 08/09/2021, 08/14/2022   Pneumococcal Conjugate-13 08/19/2013   Tdap 08/11/2011, 10/25/2022   Zoster Recombinant(Shingrix) 11/18/2020, 03/10/2022   Zoster, Live 11/18/2020, 03/07/2021   Pertinent  Health Maintenance Due  Topic Date Due   INFLUENZA VACCINE  Completed   DEXA SCAN  Completed      01/09/2019   10:26 PM 01/10/2019   11:08 AM 10/26/2022   10:55 AM 02/19/2023   10:00 AM 05/01/2023    2:03 PM  Fall Risk  Falls in the past year?   0 0 0  Was there an injury with Fall?   0 0 0  Fall Risk Category Calculator   0 0 0  (RETIRED) Patient Fall Risk Level High fall risk High fall risk     Patient at Risk for Falls Due to   No Fall Risks No Fall Risks History of fall(s);Impaired balance/gait;Impaired mobility  Fall risk Follow up   Falls evaluation completed Falls evaluation completed Falls evaluation completed;Education  provided;Falls prevention discussed   Functional Status Survey:    Vitals:   05/31/23 1421  BP: (!) 130/96  Pulse: (!) 147  Resp: 20  Temp: 98.3 F (36.8 C)  SpO2: 95%   There is no height or weight on file to calculate BMI. Physical Exam Vitals and nursing note reviewed.  Constitutional:      Appearance: She is ill-appearing.     Comments: Lethargic. Responds to physical stim  HENT:     Head: Normocephalic and atraumatic.     Mouth/Throat:     Mouth: Mucous membranes are dry.  Neck:     Vascular: No JVD.  Cardiovascular:     Rate and Rhythm: Tachycardia present. Rhythm irregular.     Heart sounds: No murmur heard. Pulmonary:     Breath sounds: No wheezing or rhonchi.     Comments: Slight increased WOB Cyanosis to right nail beds Abdominal:     General: There is no distension.  Palpations: Abdomen is soft.     Tenderness: There is no abdominal tenderness.     Comments: Hypoactive x 4  Skin:    General: Skin is warm and dry.  Neurological:     Comments: Right side flaccid.      Labs reviewed: Recent Labs    09/05/22 0556 09/06/22 0540 09/07/22 0915 02/27/23 0000 05/28/23 1003 05/28/23 1005 05/29/23 0503  NA 137 137   < > 133* 139 138 140  K 4.1 4.0   < > 4.2 4.2 4.3 4.3  CL 110 110   < > 99 104 104 107  CO2 22 23   < > 24*  --  26 22  GLUCOSE 77 85   < >  --  83 87 134*  BUN 15 18   < > 27* 43* 44* 35*  CREATININE 1.16* 1.26*   < > 1.7* 2.50* 2.35* 1.78*  CALCIUM 7.9* 8.6*   < > 8.8  --  9.5 9.1  MG  --   --   --   --   --   --  2.1  PHOS 3.4 3.0  --   --   --   --  3.6   < > = values in this interval not displayed.   Recent Labs    09/03/22 0642 09/04/22 0750 09/05/22 0556 09/14/22 0000 02/27/23 0000 05/28/23 1005  AST 239* 208*  --  61* 23 25  ALT 240* 220*  --  65* 8 15  ALKPHOS 151* 161*  --  223* 100 87  BILITOT 0.9 1.0  --   --   --  0.9  PROT 5.5* 6.1*  --   --   --  6.6  ALBUMIN 2.3* 2.5*  2.5*   < > 3.0* 3.1* 3.2*   < > =  values in this interval not displayed.   Recent Labs    09/03/22 0642 09/07/22 0915 09/14/22 0000 02/27/23 0000 05/28/23 1003 05/28/23 1005 05/29/23 0503  WBC 7.8 8.3   < > 6.0  --  10.8* 10.1  NEUTROABS 5.2 5.3  --   --   --  6.1  --   HGB 11.8* 12.6   < > 11.1* 12.6 12.4 13.3  HCT 35.0* 38.8   < > 32* 37.0 38.6 39.5  MCV 95.6 97.7  --   --   --  102.4* 98.3  PLT PLATELET CLUMPS NOTED ON SMEAR, UNABLE TO ESTIMATE 144*   < > 178  --  205 217   < > = values in this interval not displayed.   Lab Results  Component Value Date   TSH 0.103 (L) 05/29/2023   Lab Results  Component Value Date   HGBA1C 5.2 05/28/2023   Lab Results  Component Value Date   CHOL 168 05/29/2023   HDL 96 05/29/2023   LDLCALC 56 05/29/2023   TRIG 80 05/29/2023   CHOLHDL 1.8 05/29/2023    Significant Diagnostic Results in last 30 days:  MR ANGIO NECK W WO CONTRAST Result Date: 05/28/2023 CLINICAL DATA:  Stroke/TIA, determine embolic source EXAM: MRA HEAD WITHOUT CONTRAST MRA NECK WITHOUT AND WITH CONTRAST TECHNIQUE: Angiographic images of the Circle of Willis were acquired using MRA technique without intravenous contrast. Angiographic images of the neck were acquired using MRA technique without and with intravenous contrast. Carotid stenosis measurements (when applicable) are obtained utilizing NASCET criteria, using the distal internal carotid diameter as the denominator. CONTRAST:  7mL GADAVIST GADOBUTROL 1 MMOL/ML  IV SOLN COMPARISON:  Same day Brain MR FINDINGS: MRA HEAD FINDINGS Anterior circulation: There is likely focal occlusion of an M2 segment of the left MCA (series 257, image 80 / see screenshot). The right ICA is occluded to the level of the supraclinoid ICA. There is reconstitution likely via the right PCOM and A-comm. Posterior circulation: No aneurysm. No occlusion. No vascular malformation. Anatomic variants: There are prominent PCOM bilaterally. MRA NECK FINDINGS Aortic arch: Standard  three-vessel aortic arch. Origins of the bilateral vertebral arteries are poorly visualized due to respiratory motion artifact. Right carotid system: The right ICA is occluded at the origin. Left carotid system: The proximal and mid segments of the left common carotid artery could not be visualized due to respiratory motion artifact. The left ICA is patent and normal in caliber. No evidence of dissection. No hemodynamically significant stenosis. Vertebral arteries: Origins of the bilateral vertebral arteries could not be assessed due to respiratory motion artifact. Within this limitation, codominant vertebral arteries that are patent to the vertebrobasilar junction. Other: None IMPRESSION: 1. Likely focal occlusion of a proximal M2 segment of the left MCA (see screenshot) 2. The right ICA is occluded at the origin to the level of the supraclinoid ICA. There is reconstitution via the right PCOM and A-comm/ACA. 3. The proximal and mid segments of the left common carotid artery could not be visualized due to respiratory motion artifact. The left ICA is patent and normal in caliber to the ICA terminus. These results will be called to the ordering clinician or representative by the Radiologist Assistant, and communication documented in the PACS or [employer]. Electronically Signed   By: Lorenza Cambridge M.D.   On: 05/28/2023 18:51   MR ANGIO HEAD WO CONTRAST Result Date: 05/28/2023 CLINICAL DATA:  Stroke/TIA, determine embolic source EXAM: MRA HEAD WITHOUT CONTRAST MRA NECK WITHOUT AND WITH CONTRAST TECHNIQUE: Angiographic images of the Circle of Willis were acquired using MRA technique without intravenous contrast. Angiographic images of the neck were acquired using MRA technique without and with intravenous contrast. Carotid stenosis measurements (when applicable) are obtained utilizing NASCET criteria, using the distal internal carotid diameter as the denominator. CONTRAST:  7mL GADAVIST GADOBUTROL 1 MMOL/ML IV  SOLN COMPARISON:  Same day Brain MR FINDINGS: MRA HEAD FINDINGS Anterior circulation: There is likely focal occlusion of an M2 segment of the left MCA (series 257, image 80 / see screenshot). The right ICA is occluded to the level of the supraclinoid ICA. There is reconstitution likely via the right PCOM and A-comm. Posterior circulation: No aneurysm. No occlusion. No vascular malformation. Anatomic variants: There are prominent PCOM bilaterally. MRA NECK FINDINGS Aortic arch: Standard three-vessel aortic arch. Origins of the bilateral vertebral arteries are poorly visualized due to respiratory motion artifact. Right carotid system: The right ICA is occluded at the origin. Left carotid system: The proximal and mid segments of the left common carotid artery could not be visualized due to respiratory motion artifact. The left ICA is patent and normal in caliber. No evidence of dissection. No hemodynamically significant stenosis. Vertebral arteries: Origins of the bilateral vertebral arteries could not be assessed due to respiratory motion artifact. Within this limitation, codominant vertebral arteries that are patent to the vertebrobasilar junction. Other: None IMPRESSION: 1. Likely focal occlusion of a proximal M2 segment of the left MCA (see screenshot) 2. The right ICA is occluded at the origin to the level of the supraclinoid ICA. There is reconstitution via the right PCOM and A-comm/ACA. 3. The proximal  and mid segments of the left common carotid artery could not be visualized due to respiratory motion artifact. The left ICA is patent and normal in caliber to the ICA terminus. These results will be called to the ordering clinician or representative by the Radiologist Assistant, and communication documented in the PACS or [employer]. Electronically Signed   By: Lorenza Cambridge M.D.   On: 05/28/2023 18:51   ECHOCARDIOGRAM COMPLETE Result Date: 05/28/2023    ECHOCARDIOGRAM REPORT   Patient Name:   ASENAT EZEKIEL Date of Exam: 05/28/2023 Medical Rec #:  [id number]        Height:       63.0 in Accession #:    [id number]       Weight:       161.8 lb Date of Birth:  06/16/1922       BSA:          1.767 m Patient Age:    89 years         BP:           160/108 mmHg Patient Gender: F                HR:           120 bpm. Exam Location:  Inpatient Procedure: 2D Echo, Color Doppler and Cardiac Doppler Indications:    Stroke I63.9  History:        Patient has prior history of Echocardiogram examinations, most                 recent 09/07/2022.  Sonographer:    Harriette Bouillon RDCS Referring Phys: [phone] ERIC LINDZEN IMPRESSIONS  1. Left ventricular ejection fraction, by estimation, is 60 to 65%. The left ventricle has normal function. The left ventricle has no regional wall motion abnormalities. Left ventricular diastolic parameters are consistent with Grade III diastolic dysfunction (restrictive).  2. Right ventricular systolic function is severely reduced. The right ventricular size is moderately enlarged. There is severely elevated pulmonary artery systolic pressure.  3. Left atrial size was mildly dilated.  4. The mitral valve is degenerative. Mild mitral valve regurgitation. Moderate mitral annular calcification.  5. Tricuspid valve regurgitation is moderate to severe.  6. The aortic valve is tricuspid. Aortic valve regurgitation is not visualized. Aortic valve sclerosis is present, with no evidence of aortic valve stenosis.  7. The inferior vena cava is dilated in size with <50% respiratory variability, suggesting right atrial pressure of 15 mmHg. FINDINGS  Left Ventricle: Left ventricular ejection fraction, by estimation, is 60 to 65%. The left ventricle has normal function. The left ventricle has no regional wall motion abnormalities. The left ventricular internal cavity size was small. There is no left ventricular hypertrophy. Left ventricular diastolic parameters are consistent with Grade III diastolic dysfunction  (restrictive). Right Ventricle: The right ventricular size is moderately enlarged. No increase in right ventricular wall thickness. Right ventricular systolic function is severely reduced. There is severely elevated pulmonary artery systolic pressure. The tricuspid regurgitant velocity is 3.37 m/s, and with an assumed right atrial pressure of 15 mmHg, the estimated right ventricular systolic pressure is 60.4 mmHg. Left Atrium: Left atrial size was mildly dilated. Right Atrium: Right atrial size was normal in size. Pericardium: There is no evidence of pericardial effusion. Mitral Valve: The mitral valve is degenerative in appearance. There is mild thickening of the mitral valve leaflet(s). Moderately decreased mobility of the mitral valve leaflets. Moderate mitral annular calcification. Mild mitral valve regurgitation. Tricuspid  Valve: The tricuspid valve is normal in structure. Tricuspid valve regurgitation is moderate to severe. Aortic Valve: The aortic valve is tricuspid. Aortic valve regurgitation is not visualized. Aortic valve sclerosis is present, with no evidence of aortic valve stenosis. Pulmonic Valve: The pulmonic valve was normal in structure. Pulmonic valve regurgitation is trivial. Aorta: The aortic root and ascending aorta are structurally normal, with no evidence of dilitation. Venous: The inferior vena cava is dilated in size with less than 50% respiratory variability, suggesting right atrial pressure of 15 mmHg. IAS/Shunts: No atrial level shunt detected by color flow Doppler.  LEFT VENTRICLE PLAX 2D LVIDd:         3.20 cm LVIDs:         2.20 cm LV PW:         1.00 cm LV IVS:        1.10 cm LVOT diam:     1.50 cm LV SV:         30 LV SV Index:   17 LVOT Area:     1.77 cm  RIGHT VENTRICLE             IVC RV S prime:     10.90 cm/s  IVC diam: 2.30 cm TAPSE (M-mode): 0.8 cm LEFT ATRIUM             Index        RIGHT ATRIUM           Index LA diam:        3.70 cm 2.09 cm/m   RA Area:     14.70 cm LA  Vol (A2C):   58.8 ml 33.27 ml/m  RA Volume:   35.10 ml  19.86 ml/m LA Vol (A4C):   66.5 ml 37.63 ml/m LA Biplane Vol: 64.2 ml 36.33 ml/m  AORTIC VALVE LVOT Vmax:   87.80 cm/s LVOT Vmean:  58.000 cm/s LVOT VTI:    0.169 m  AORTA Ao Root diam: 3.00 cm Ao Asc diam:  3.40 cm MITRAL VALVE                TRICUSPID VALVE MV Area (PHT): 4.93 cm     TR Peak grad:   45.4 mmHg MV Decel Time: 154 msec     TR Vmax:        337.00 cm/s MV E velocity: 160.00 cm/s MV A velocity: 43.10 cm/s   SHUNTS MV E/A ratio:  3.71         Systemic VTI:  0.17 m                             Systemic Diam: 1.50 cm Clearnce Hasten Electronically signed by Clearnce Hasten Signature Date/Time: 05/28/2023/4:18:36 PM    Final    MR BRAIN WO CONTRAST Result Date: 05/28/2023 CLINICAL DATA:  Provided history: Stroke, follow-up. EXAM: MRI HEAD WITHOUT CONTRAST TECHNIQUE: Multiplanar, multiecho pulse sequences of the brain and surrounding structures were obtained without intravenous contrast. COMPARISON:  Non-contrast head CT performed earlier today 05/28/2023. FINDINGS: Brain: Generalized cerebral atrophy, mild for age. Acute cortical/subcortical left MCA territory infarct affecting portions of the mid and posterior left frontal lobe, left insula and left parietal lobe (spanning 7 cm). Of note, there is involvement of the pre and postcentral gyri. Small acute cortical infarct within the mid left frontal lobe medially (ACA vascular territory). No significant mass effect related to these infarcts. No evidence of hemorrhagic conversion. Background severe patchy  and confluent T2 FLAIR hyperintense signal abnormality within the cerebral white matter, nonspecific but compatible with chronic small vessel ischemic disease. Mild chronic small vessel ischemic changes also noted within the pons. Small chronic infarcts within the left cerebellar hemisphere. Partially empty sella turcica. No evidence of an intracranial mass. No extra-axial fluid collection. No  midline shift. Vascular: A flow void is poorly delineated within portions of the right internal carotid artery siphon, which may reflect high-grade stenosis or vessel occlusion. Skull and upper cervical spine: No focal worrisome marrow lesion. Sinuses/Orbits: Leftward gaze. Prior bilateral ocular lens replacement. Minimal mucosal thickening within the bilateral ethmoid sinuses. Impressions #1, #2 and #3 called by telephone at the time of interpretation on 05/28/2023 at 12:25 pm to provider Dr. Lockie Mola, who verbally acknowledged these results. IMPRESSION: 1. Acute cortical/subcortical left MCA territory infarct spanning 7 cm, as described. 2. Small acute cortical infarct within the mid left frontal lobe medially (ACA vascular territory). 3. A flow void is poorly delineated within portions of the right internal carotid artery siphon, which could reflect high-grade stenosis or vessel occlusion. Consider CT or MR angiography for further evaluation. 4. Background severe chronic small vessel ischemic disease. 5. Small chronic infarcts within the left cerebellar hemisphere. 6. Mild generalized cerebral atrophy. Electronically Signed   By: Jackey Loge D.O.   On: 05/28/2023 12:27   DG Chest Portable 1 View Result Date: 05/28/2023 CLINICAL DATA:  Code stroke with aphasia and weakness. EXAM: PORTABLE CHEST 1 VIEW COMPARISON:  09/06/2022 FINDINGS: Stable top-normal heart size. Stable elevation of the right hemidiaphragm. Decrease in congestive heart failure since the prior study with some chronic pulmonary interstitial prominence present. Resolution of bilateral pleural effusions since the prior study with potential mild chronic pleural effusion on the right. No pneumothorax. IMPRESSION: Decrease in congestive heart failure since the prior study with some chronic pulmonary interstitial prominence present. Resolution of bilateral pleural effusions since the prior study with potential mild chronic pleural effusion on the  right. Electronically Signed   By: Irish Lack M.D.   On: 05/28/2023 10:54   CT HEAD CODE STROKE WO CONTRAST Result Date: 05/28/2023 CLINICAL DATA:  Code stroke.  Neuro deficit, acute, stroke suspected EXAM: CT HEAD WITHOUT CONTRAST TECHNIQUE: Contiguous axial images were obtained from the base of the skull through the vertex without intravenous contrast. RADIATION DOSE REDUCTION: This exam was performed according to the departmental dose-optimization program which includes automated exposure control, adjustment of the mA and/or kV according to patient size and/or use of iterative reconstruction technique. COMPARISON:  None Available. FINDINGS: Motion limited study.  Within this limitation: Brain: No evidence of acute large vascular territory infarction, hemorrhage, hydrocephalus, extra-axial collection or mass lesion/mass effect. Patchy white matter hypodensities are nonspecific but compatible with chronic microvascular change. Vascular: No hyperdense vessel. Skull: No acute fracture. Sinuses/Orbits: Clear sinuses.  No acute orbital findings. Other: No mastoid effusions. ASPECTS [facility] Stroke Program Early CT Score) Total score (0-10 with 10 being normal): 10. IMPRESSION: 1. No evidence of acute intracranial abnormality. 2. ASPECTS is 10. Electronically Signed   By: Feliberto Harts M.D.   On: 05/28/2023 10:17    Assessment/Plan 1. Acute ischemic stroke (HCC) (Primary) Severe with right sided weakness Unable to swallow Goals of care are comfort based Hospice consult pending Ativan and morphine ordered if needed for sob/pain She appears comfortable at this time.   2. Oropharyngeal dysphagia Not able to swallow effectively due to #1 NPO accept meds.   3. Atrial fibrillation with RVR (  HCC) Uncontrolled HR Per goals of care  no further medications.      Family/ staff Communication: Husband Annette Stable is at her bedside. Discussed her care with him. He is in agreement to keeping her  comfortable with morphine and ativan. He expressed appreciation and understanding of the visit and and her care.   Labs/tests ordered:  NA

## 2023-06-06 DEATH — deceased
